# Patient Record
Sex: Female | Born: 1955 | Race: Black or African American | Hispanic: No | Marital: Single | Smoking: Never smoker
Health system: Southern US, Community
[De-identification: ages and names within clinical notes are randomized; demographics above are authoritative.]

## PROBLEM LIST (undated history)

## (undated) DIAGNOSIS — I5032 Chronic diastolic (congestive) heart failure: Secondary | ICD-10-CM

## (undated) DIAGNOSIS — R569 Unspecified convulsions: Secondary | ICD-10-CM

## (undated) DIAGNOSIS — Z86718 Personal history of other venous thrombosis and embolism: Secondary | ICD-10-CM

## (undated) DIAGNOSIS — M1990 Unspecified osteoarthritis, unspecified site: Secondary | ICD-10-CM

## (undated) DIAGNOSIS — I10 Essential (primary) hypertension: Secondary | ICD-10-CM

## (undated) DIAGNOSIS — E785 Hyperlipidemia, unspecified: Secondary | ICD-10-CM

## (undated) DIAGNOSIS — E119 Type 2 diabetes mellitus without complications: Secondary | ICD-10-CM

## (undated) DIAGNOSIS — Z8701 Personal history of pneumonia (recurrent): Secondary | ICD-10-CM

## (undated) DIAGNOSIS — N186 End stage renal disease: Secondary | ICD-10-CM

## (undated) DIAGNOSIS — G4730 Sleep apnea, unspecified: Secondary | ICD-10-CM

## (undated) HISTORY — PX: ABDOMINAL HYSTERECTOMY: SHX81

## (undated) HISTORY — PX: COLONOSCOPY: SHX174

## (undated) HISTORY — DX: Personal history of pneumonia (recurrent): Z87.01

## (undated) HISTORY — DX: End stage renal disease: N18.6

## (undated) HISTORY — DX: Hyperlipidemia, unspecified: E78.5

## (undated) HISTORY — DX: Chronic diastolic (congestive) heart failure: I50.32

---

## 1998-01-16 ENCOUNTER — Ambulatory Visit (HOSPITAL_COMMUNITY): Admission: RE | Admit: 1998-01-16 | Discharge: 1998-01-16 | Payer: Self-pay | Admitting: Obstetrics and Gynecology

## 1998-02-21 ENCOUNTER — Ambulatory Visit: Admission: RE | Admit: 1998-02-21 | Discharge: 1998-02-21 | Payer: Self-pay | Admitting: Gynecology

## 1998-02-27 ENCOUNTER — Inpatient Hospital Stay (HOSPITAL_COMMUNITY): Admission: RE | Admit: 1998-02-27 | Discharge: 1998-03-03 | Payer: Self-pay | Admitting: Obstetrics and Gynecology

## 1998-03-06 ENCOUNTER — Ambulatory Visit: Admission: RE | Admit: 1998-03-06 | Discharge: 1998-03-06 | Payer: Self-pay | Admitting: Gynecology

## 1998-03-13 ENCOUNTER — Ambulatory Visit: Admission: RE | Admit: 1998-03-13 | Discharge: 1998-03-13 | Payer: Self-pay | Admitting: Gynecologic Oncology

## 1998-03-20 ENCOUNTER — Inpatient Hospital Stay (HOSPITAL_COMMUNITY): Admission: EM | Admit: 1998-03-20 | Discharge: 1998-03-31 | Payer: Self-pay | Admitting: Gynecology

## 1998-03-29 ENCOUNTER — Encounter: Payer: Self-pay | Admitting: Obstetrics and Gynecology

## 1998-04-03 ENCOUNTER — Ambulatory Visit: Admission: RE | Admit: 1998-04-03 | Discharge: 1998-04-03 | Payer: Self-pay | Admitting: Gynecology

## 1998-06-12 ENCOUNTER — Ambulatory Visit: Admission: RE | Admit: 1998-06-12 | Discharge: 1998-06-12 | Payer: Self-pay | Admitting: Gynecology

## 1999-09-06 ENCOUNTER — Encounter: Admission: RE | Admit: 1999-09-06 | Discharge: 1999-09-06 | Payer: Self-pay | Admitting: Endocrinology

## 1999-09-06 ENCOUNTER — Encounter: Payer: Self-pay | Admitting: Endocrinology

## 2001-08-26 ENCOUNTER — Emergency Department (HOSPITAL_COMMUNITY): Admission: EM | Admit: 2001-08-26 | Discharge: 2001-08-26 | Payer: Self-pay | Admitting: Emergency Medicine

## 2001-08-26 ENCOUNTER — Encounter: Payer: Self-pay | Admitting: Emergency Medicine

## 2002-09-14 ENCOUNTER — Ambulatory Visit: Admission: RE | Admit: 2002-09-14 | Discharge: 2002-09-14 | Payer: Self-pay | Admitting: Endocrinology

## 2003-08-13 ENCOUNTER — Emergency Department (HOSPITAL_COMMUNITY): Admission: EM | Admit: 2003-08-13 | Discharge: 2003-08-13 | Payer: Self-pay

## 2003-09-05 ENCOUNTER — Inpatient Hospital Stay (HOSPITAL_COMMUNITY): Admission: EM | Admit: 2003-09-05 | Discharge: 2003-09-18 | Payer: Self-pay | Admitting: Emergency Medicine

## 2005-11-15 ENCOUNTER — Inpatient Hospital Stay (HOSPITAL_COMMUNITY): Admission: EM | Admit: 2005-11-15 | Discharge: 2005-11-19 | Payer: Self-pay | Admitting: *Deleted

## 2006-09-01 ENCOUNTER — Encounter: Admission: RE | Admit: 2006-09-01 | Discharge: 2006-11-30 | Payer: Self-pay | Admitting: Endocrinology

## 2006-11-03 ENCOUNTER — Encounter (INDEPENDENT_AMBULATORY_CARE_PROVIDER_SITE_OTHER): Payer: Self-pay | Admitting: General Surgery

## 2006-11-03 ENCOUNTER — Ambulatory Visit (HOSPITAL_BASED_OUTPATIENT_CLINIC_OR_DEPARTMENT_OTHER): Admission: RE | Admit: 2006-11-03 | Discharge: 2006-11-03 | Payer: Self-pay | Admitting: General Surgery

## 2007-07-02 ENCOUNTER — Ambulatory Visit (HOSPITAL_COMMUNITY): Admission: RE | Admit: 2007-07-02 | Discharge: 2007-07-02 | Payer: Self-pay | Admitting: Endocrinology

## 2009-05-02 ENCOUNTER — Encounter: Admission: RE | Admit: 2009-05-02 | Discharge: 2009-05-02 | Payer: Self-pay | Admitting: Endocrinology

## 2009-12-13 ENCOUNTER — Encounter: Admission: RE | Admit: 2009-12-13 | Discharge: 2009-12-13 | Payer: Self-pay | Admitting: Endocrinology

## 2010-06-23 ENCOUNTER — Encounter: Payer: Self-pay | Admitting: Internal Medicine

## 2010-10-18 NOTE — Discharge Summary (Signed)
NAME:  [name], [name]                         ACCOUNT NO.:  [id number]   MEDICAL RECORD NO.:  [place]:[id number]                   PATIENT TYPE:  INP   LOCATION:  [id number]                                 FACILITY:  [facility]   PHYSICIAN:  Cyril Mourning, D.O.                 DATE OF BIRTH:  [birth year]   DATE OF ADMISSION:  09/05/2003  DATE OF DISCHARGE:  09/18/2003                                 DISCHARGE SUMMARY   ADMISSION DIAGNOSES:  1. Shortness of breath.  2. Congestive heart failure.  3. Acute-on-chronic renal insufficiency.  4. Hypercapnic respiratory failure.   DISCHARGE DIAGNOSES:  1. Acute renal failure, presenting with a creatinine of 3.0 with historical     baseline of 1.7, status post pharmacotherapy with ACE inhibitor and     Relafen use, with resolution and return to baseline of creatinine to 1.8     at the time of discharge.  2. Hypercapnic respiratory failure without need for mechanical ventilation     this admission.  The patient escaped with use of BiPAP status post     pulmonary critical care evaluation with diagnoses of obesity,     hypoventilatory syndrome, and obstructive sleep apnea, with exacerbation     of congestive heart failure.  Of note, she was not on oxygen therapy at     home, and is being discharged on oxygen therapy at this time, as well.     She is being encouraged to comply with her CPAP at night.  3. Fluid overload secondary to acute renal failure.  Explained as above.  4. Diabetes mellitus.  5. Hyperkalemia status post therapy with Kayexalate.  6. Morbid obesity.  7. Seizure disorder greater than 20 years ago.  Tegretol was stopped this     admission without recurrence.  8. Status post hysterectomy without oophorectomy.   MEDICATIONS ON DISCHARGE:  1. Lasix 40 mg daily.  2. Aspirin 81 mg daily.  3. Albuterol metered dose inhaler two puffs q.i.d. p.r.n.  4. Lantus 26 units subcutaneous q.h.s., as she remained under relatively     good control  throughout her hospitalization, though she does take 32     units at home.  I explained to her that she may need to up-titrate this     in the outpatient setting.  5. Nu-Iron 150 mg p.o. b.i.d.  6. Claritin 10 mg p.o. q.h.s.  7. Accupril 5 mg p.o. daily.  She was on this medication at a dose of 20 mg     daily; however, due to recent events, I am starting her at a lose dose of     5 mg daily.  8. Humalog is being continued in the outpatient setting as she was on     before, 75/25, 22 units q.a.m. and q.p.m.  9. Toprol-XL 100 mg is being decreased to p.o. daily, and can be up-titrated     to  b.i.d. in the outpatient setting.  10.      Norvasc has been added at 5 mg p.o. daily.   DISPOSITION:  I have recommended that she follow up with Dr. Wilson Singer.  She has  an appointment on September 25, 2003 at 1:20 p.m.  She is instructed to have a  basic metabolic panel drawn at that time, as well.  She is instructed to  adhere to a healthy heart diet and limit her caloric intake.  She is  instructed to adhere to 24 hours a day oxygen therapy, 4 liters, as well as  a CPAP at night, setting at 5-7 mm as tolerated, and she is instructed to  check and record her CBG's q.a.m. and q.h.s., as well as if she has symptoms  of low blood sugar.   HISTORY OF PRESENTING ILLNESS:  For full details, see history and physical  as dictated by Dr. Gwynneth Aliment.  However, briefly, this is a 55 year old  African-American female who had a 3-week history of worsening dyspnea, leg  pain, and swelling.  She had a baseline history of congestive heart failure,  diabetes, and was last seen in the emergency department with worsening CHF.  She had responded to IV Lasix, and was discharged from the emergency room.  At that time, her creatinine was 1.7.  Her chest x-ray showed some  cardiomegaly with some chronic pulmonary vascular congestion and  interstitial disease.  The following day, she fell and saw her orthopedic  physician, and  was placed on Relafen, which she had taken until the day  previous to admission.  In the interim, she had been feeling worse.  She  came to the emergency department in acute renal failure with a creatinine of  3.0, and a potassium of 7.1.   HOSPITAL COURSE:  The patient was admitted to the hospital and placed on  BiPAP.  She was found to be quite acidotic and hypercapnic, with also an  initial bicarb of 22.  She underwent therapy with Kayexalate and IV diuretic  therapy and renal consultation.  She was seen by Dr. Hassell Done of the renal  service, as well as pulmonary critical care, who assisted with pulmonary  management and titrated the patient to receive intermittent BiPAP.  Her  hospital course was one of improvement.  She titrated to be off of BiPAP,  and ultimately on CPAP only at night.  She was treated for a urinary tract  infection.  Her hospital course was uneventful.  She remained on  prophylactic doses of low molecular weight heparin throughout her course.  Medications were adjusted.  Once again, she was resumed on a low dose of ACE  inhibitor, and is being discharged home in stable condition.  I have  scheduled her to follow up with Dr. Wilson Singer as noted above.   LABORATORY DATA AT THE TIME OF DISCHARGE:  BNP at the time of discharge was  91.  Her sodium was 143, potassium 4.5, BUN 18, creatinine 1.8.  There was  no echocardiogram performed this admission.                                               Cyril Mourning, D.O.    ESS/MEDQ  D:  09/18/2003  T:  09/18/2003  Job:  [id number]   cc:   [name], M.D.  [address]  Christophe Horn [address]  [place] [address]  Fax: [place] Hassell Done, M.D.  [place]  [place] [address]  Fax: [phone]   Danton Sewer, M.D. [facility]

## 2010-10-18 NOTE — Discharge Summary (Signed)
[name], [name]               ACCOUNT NO.:  [id number]   MEDICAL RECORD NO.:  [place]:[id number]          PATIENT TYPE:  INP   LOCATION:  [id number]                         FACILITY:  [facility]   PHYSICIAN:  Barbette Merino, M.D.      DATE OF BIRTH:  [birth year]   DATE OF ADMISSION:  11/15/2005  DATE OF DISCHARGE:  11/19/2005                                 DISCHARGE SUMMARY   PRIMARY CARE PHYSICIAN:  Ronaldo Miyamoto, M.D.   DISCHARGE DIAGNOSES:  1.  Uncontrolled diabetes with severe hyperglycemia.  2.  Morbid obesity.  3.  Trichomonas infection.  4.  Dyslipidemia.  5.  Chronic kidney disease.  6.  Obstructive sleep apnea.   DISCHARGE MEDICATIONS:  1.  Toprol XL 100 mg daily.  2.  Accupril 5 mg daily.  3.  Norvasc 10 mg daily.  4.  Lasix 40 mg daily.  5.  Lipitor 14 mg daily.  6.  Aspirin 81 mg daily.  7.  Actos 30 mg daily.  8.  Lantus insulin 50 units q.h.s.  9.  Humalog 70/30 insulin 45 units in the a.m. and 35 units in the p.m.  10. Flagyl 500 mg b.i.d. x3 more days.   DISPOSITION:  The patient will follow up with Dr. Wilson Singer.  Her sugars are  much better, but still slightly elevated.  She will have further adjustment  of her insulin as an outpatient.   PROCEDURES:  None.   CONSULTATIONS:  None.   BRIEF HISTORY AND PHYSICAL:  Please refer to dictated History and Physical  by the nurse practitioner on admission.  It showed, however, that the  patient is a 55 year old African American female with history of diabetes,  hypertension, morbid obesity.  1.  She was admitted directly secondary to blurred vision and hyperglycemia.      The patient has had outpatient adjustment of her insulin that did not      seem to work adequately.  Hence, she was brought in for inpatient      management.  At the time of her admission, her sugars were 609.  She was      subsequently admitted for further management.  She was hyperglycemic.      The patient was initiated on IV insulin initially, later converted  to      subcutaneous insulin.  She has had her Lantus as well as Sliding scale      insulin adjusted.  Her CBG's are declining and stayed in the 300's      initially.  She is now under 200 and further adjustment is being made      now.  She will need to follow up with her sugars at home and also follow      with Dr. Wilson Singer within the next 1-2 weeks for further adjustment.      Hemoglobin A1C was also checked, and it was 12.2, meaning that she has      been having very high sugars at home anyway.  2.  Hypertension.  Her blood pressure has been high.  Her Norvasc dose has  been switched 9-10 mg to control it to make it into the range that is      acceptable for a diabetic which lands at 130/80.  3.  Chronic kidney disease.  The patient's urine creatinine was elevated on      admission, but has been decreasing gradually.  Her creatinine was less      than 2, but she does have baseline diabetic nephropathy.  Further follow      up will be done as an outpatient.  4.  Dyslipidemia.  The patient was switched to Zocor in the hospital, and      she is currently back on her Lipitor.   Other medical problems were essentially stable as per home.  She was  dehydrated, obviously, and received adequate hydration.      Barbette Merino, M.D.  Electronically Signed     LG/MEDQ  D:  11/19/2005  T:  11/19/2005  Job:  [id number]   cc:   Ronaldo Miyamoto, M.D.  Fax: [phone]

## 2010-10-18 NOTE — Op Note (Signed)
[name], [name]               ACCOUNT NO.:  [id number]   MEDICAL RECORD NO.:  [place]:[id number]          PATIENT TYPE:  AMB   LOCATION:  [place]                          FACILITY:  [facility]   PHYSICIAN:  Orson Ape. [place], M.D.DATE OF BIRTH:  [birth year]   DATE OF PROCEDURE:  DATE OF DISCHARGE:                               OPERATIVE REPORT   PREOPERATIVE DIAGNOSES:  Lipoma, back below the left bra line, greater  than 4 cm.   POSTOPERATIVE DIAGNOSES:  Lipoma, back below the left bra line, greater  than 4 cm.   PROCEDURE:  Excision of lipoma.   SURGEON:  Orson Ape. Rise Patience, M.D.   ANESTHESIA:  Local.   HISTORY:  [name] is a 55 year old moderately overweight female,  400 pounds, who has an area about the size of an egg below her bra line  on the left back that is bothersome with the pressure pushing on the  area.  Dr. Benny Lennert, her medical physician referred her to me and  we positioned her several ways in the office and it appears that if she  is lying flat and her arms down, the area is most easily identified.  She is scheduled for excision of this today, presents and the patient  identified.   PROCEDURE IN DETAIL:  We tried her in several positions on the OR table  and it appears that this is the best and the area where the lumb is was  encompassed with a pen to mark the skin area.  I then prepped it with  Betadine solution and then about 20 cc of 1% Xylocaine with adrenalin  was used to anesthetize 1 sort of a medial area in the subcu sheath,  tissue and skin was made medial to the area to kind of like a field  block and then I anesthetized the area of skin overlying the area.  About a 3 inch incision was made, sharp dissection down through the skin  and subcutaneous tissue and then this fatty tumor was easily exposed and  a Kittner was used to kind of dissect it from the surrounding fatty  tissue.  It was kind of fingers of different appearing fatty tissue but  obviously benign.  It was completely separated. There was a little  bleeding area that come under medially and this sutured with 2 figure of  8 sutures of 4-0 Vicryl.  I then repositioned her arm to make sure there  were no other fatty areas.  I did not feel any.  Then we closed the  areas of 4-0 Vicryl and then used 3-0 Nylon simple sutures on the skin.  The patient tolerated the procedure without any significant discomfort,  minimal blood loss and light occlusive dressing was applied.  She will  keep the area dry for about 4 days.  Keep a little light covering on it  so it is not being irritated by her bra and see [place] back in the office  either in the next week or the 1st of the following week for suture  removal.  She is accompanied by her sister today  who will drive her  home.  She understands that I will not do any strenuous activity today  but she should be able to drive, etc. tomorrow.           ______________________________  Orson Ape. Rise Patience, M.D.     WJW/MEDQ  D:  11/03/2006  T:  11/03/2006  Job:  [id number]

## 2010-10-18 NOTE — Consult Note (Signed)
NAME:  [name], [name]                         ACCOUNT NO.:  [id number]   MEDICAL RECORD NO.:  [id number]                   PATIENT TYPE:  INP   LOCATION:  0157                                 FACILITY:  [facility]   PHYSICIAN:  Maudie Flakes. Hassell Done, M.D.                DATE OF BIRTH:  November 14, 1955   DATE OF CONSULTATION:  09/05/2003  DATE OF DISCHARGE:                                   CONSULTATION   REASON FOR CONSULTATION:  [name] is a 55 year old black woman admitted  with orthopnea, DOE, and increasing pretibial edema.  She has underlying  renal insufficiency with creatinine of 1.7 last month.  She hurt my leg,  got prescription for Relafen last month, and has noted the above symptoms  since then.  Dr. Gareth Eagle is her primary care doctor.   PAST MEDICAL HISTORY:  1. DM, over 20 years.  She has neuropathy, is not sure about retinopathy.  2. Hypertension, over 15 years.  3. Seizure disorder, greater than 20 years (on Tegretol).  4. CHF/? CAD.  5. Hysterectomy (ovaries still in according to her).   MEDICATIONS:  1. Glucophage 1 g b.i.d.  2. Lantus insulin 32 units q.h.s.  3. Tegretol 200 b.i.d.  4. Aspirin 81 per day.  5. Hyzaar 100/25 daily.  6. Toprol-XL 100 b.i.d.  7. Prinivil 20 per day.  8. Lipitor 80 per day.  9. Desyrel (trazodone) 50 q.h.s.  10.      Lasix 80 b.i.d.  11.      K-Dur 20 b.i.d.   ALLERGIES:  None known.   REVIEW OF SYSTEMS:  Positive for edema, DOE, orthopnea.  No angina, no  claudication, no loss of consciousness, no hematemesis, no melena, no renal  colic, no dysuria.   SOCIAL HISTORY:  She grew up in [place].  She graduated from [employer].  She worked at [employer] until several years ago.  She lives  with her sister.  She has never married.  She has no children.  She does not  smoke cigarettes or drink alcohol.  No IV drug use.  No visual problems.   FAMILY HISTORY:  Her mother died at age 7 of cancer.  Her father died of  epilepsy in his 23s.  She has four sisters, one with diabetes.  She has two  brothers who are well.  She knows [name], one of our patients who is  on dialysis.   PHYSICAL EXAMINATION:  GENERAL:  She is an awake, dyspneic, overweight black  woman.  VITAL SIGNS:  Blood pressure 110/70, temperature 98, pulse 80, respirations  18.  HEENT:  Mouth:  Teeth are carious.  No oral ulcers are noted.  CHEST:  Decreased breath sounds in bases.  HEART:  No rub is heard.  ABDOMEN:  Abdominal wall edema is present.  Abdomen is protuberant.  Cannot  feel any organs or masses.  Bowel sounds are present.  GENITOURINARY AND RECTAL:  Not done.  EXTREMITIES:  Show 3+ pretibial edema.  NEUROLOGIC:  Right-handed.  Decreased sensation in feet.  Strength equal.   LABORATORY DATA:  Hemoglobin 12.3; WBC 6200; PLT'S 337,000.  Sodium 145,  potassium 7.1, chloride 113, CO2 22, BUN 86, CR 3.0, calcium 8.6, albumin  3.1.  PH 7.2, PO2 84, PCO2 66 on 2 L of oxygen.  Urinalysis not available.  Chest x-ray:  Cannot find results.  EKG:  No peaked T waves; low voltage.   IMPRESSION:  1. Diabetes mellitus.  2. Hypertension.  3. Elevated serum creatinine over baseline of 1.7 with elevated BUN,     creatinine and potassium.  4. Carbon dioxide retention/obstructive sleep apnea/hypoventilation.  5. Seizure disorder, on Tegretol.  6. Fluid overload (looks wet to me).   PLAN:  1. Discontinue Glucophage, continue Lantus with SSI.  2. Avoid ACE inhibitors, A II blockers for now.  May use once renal function     improves.  3. A 24-hour urine for protein, creatinine, UPEP, UA; renal ultrasound if     possible.  Save left arm for vascular access.  Calcium, glucose, insulin,     bicarb, Kayexalate, check potassium later tonight.  4. BiPAP, avoid elevated FIO2.  5. Continue Tegretol.  6. Lasix.   Will follow.                                               Richard F. Hassell Done, M.D.    RFF/MEDQ  D:  09/05/2003  T:   09/06/2003  Job:  [id number]

## 2010-10-18 NOTE — H&P (Signed)
NAME:  [name], CASTLEBERRY                         ACCOUNT NO.:  [id number]   MEDICAL RECORD NO.:  [id number]                   PATIENT TYPE:  INP   LOCATION:  0157                                 FACILITY:  [facility]   PHYSICIAN:  Jonna L. Gwynneth Aliment, M.D.            DATE OF BIRTH:  1956-05-21   DATE OF ADMISSION:  09/05/2003  DATE OF DISCHARGE:                                HISTORY & PHYSICAL   CHIEF COMPLAINT:  Shortness of breath.   HISTORY:  This 55 year old African-American female has a 3 week history of  worsening dyspnea, leg pain, some swelling.  She has a baseline history of  congestive heart failure, diabetes, and was last seen here in the emergency  room on August 13, 2003, with worsening of her congestive heart failure.  It  was found at that time that she had missed several Lasix doses, so she was  given Lasix 80 IV in the emergency room and discharged from there.  At that  time, her creatinine was 1.7, and her chest x-ray showed cardiomegaly and  some chronic pulmonary vascular congestion and interstitial disease.  The  next day, she fell and am not clear if she saw the orthopedist or if this  was called in for her, but she was placed on Relafen which she took until  yesterday.  In that interim, she has just been feeling worse.  She comes  into the emergency room now in acute renal failure and hyperkalemia.   PAST MEDICAL HISTORY:  1. Congestive heart failure, hypertension, coronary artery disease.  2. Type 2 diabetes.  3. Diabetic peripheral neuropathy.  4. Hypercholesterolemia.  5. Osteoarthritis.  6. History of seizures.   ALLERGIES:  None.   OPERATIONS:  Hysterectomy for fibroids.   SOCIAL HISTORY:  Nonsmoker, nondrinker.   FAMILY HISTORY:  Hypertension, diabetes, CVA, heart failure.   MEDICATIONS:  1. Lasix 80 mg 1/2-1 b.i.d.  2. Potassium 20 b.i.d.  3. Desyrel 50 h.s.  4. Lipitor 80 daily.  5. Glucophage 1000 b.i.d.  6. Prinivil 20 daily.  7. Toprol 100  b.i.d.  8. Hyzaar 100/25 daily.  9. Aspirin 81 daily.  10.      Tegretol 200 b.i.d.  11.      Humalog 75/25, 22 units in the a.m.  12.      Lantus 32 units at h.s.   REVIEW OF SYSTEMS:  The patient in general has had weakness, loss of  appetite, some chronic visual problems, no sore throat.  She has had  insomnia for the last 3 weeks.  Her neuropathy gives her numbness in her  legs.  She has no history of DVT or PE.  Some low-grade nausea, no vomiting,  diarrhea, or constipation.  No hematuria or kidney stones. No breast masses.  No history of thyroid problems.  No swollen glands.  Chronic changes in her  skin in her legs.  She has diffuse osteoarthritis,  especially in her hips  and knees.  She has a history of seizures but no CVA.  Has had a lot of  anxiety and difficulty sleeping, subsequently been started on the Desyrel.   PHYSICAL EXAMINATION ON ADMISSION:  VITAL SIGNS:  Height 5 feet 6 inches.  Weight 387 pounds, temperature 97.8, pulse 85, respirations 18, blood  pressure 122/71, 85% O2 saturation on room air.  GENERAL:  The patient is a morbidly obese African-American female sitting up  in bed.  HEENT:  Conjunctivae and lids are normal.  Pupils are reactive to light.  EOMs are full.  She has normal hearing, mucosa, and pharynx.  NECK:  Some thyromegaly.  It is supple.  There is no tenderness.  BREASTS:  No masses or tenderness.  LUNGS:  Normal respiratory effort, but it increases with exertion.  The  lungs are clear to the bases.  I do not hear any dullness.  HEART:  Regular rate and rhythm.  Normal S1 and S2 without murmurs, rubs, or  gallops.  EXTREMITIES:  There is no cyanosis or clubbing.  She has stasis dermatitis  changes, 2+ edema bilaterally, and it almost like a thickened, brawny edema.  ABDOMEN:  Nontender, normal bowel sounds.  No hepatosplenomegaly or hernias.  ORTHOPEDIC:  Muscle strength is 5/5 in all four extremities.  There seems to  be full range of motion.   There are no skin nodules or induration.  NEUROLOGIC:  Cranial nerves are intact.  DTRs are 1+ in the knees.  She has  decreased sensation, numbness and burning in both feet, is alert and  oriented x 3.  Normal memory, judgment, affect.   LABORATORY DATA:  CBC is normal.  BNP is 706.  Enzymes were negative.  PA  7.192, pCO2 65.6, pO2 84.4.  Sodium 145, potassium 7.1, chloride 113, bicarb  22, BUN 86, creatinine 3.0, glucose 88.  The rest of the BMP was normal.  EKG shows low voltage and surprisingly, no peaked T-waves.   IMPRESSION:  1. Acute on chronic renal failure.  __________ baseline creatinine of 1.7.     She has had to be on diuretics, ACE, ARB, and I think adding the anti-     inflammatory on top of it probably precipitated some acute renal failure.     The other possibility is that the patient is dry, but she still has some     edema, and there were no other changes in her medications.  I am going to     hold anything that gives you hyperkalemia or interferes with renal     function including her Metformin, the ACE, ARB, potassium supplement and     the anti-inflammatories.  Her hyperkalemia will be treated with bicarb,     calcium, glucose, insulin, and Kayexalate.  I have spoken with [facility] for consultation.  2. Congestive heart failure, hypertension, coronary artery disease.  I will     just keep her on Lasix only.  3. Type 2 diabetes.  I will keep her on just insulin sliding-scale and a     smaller dose of her Lantus.  4. Osteoarthritis.  She can have some Tylenol.  5. History of seizures.  I will check her Tegretol level first before giving     her any further.  6. Hypercholesterolemia.  I am going to hold on her Lipitor at present.  7. Probable obstructive sleep apnea with morbid obesity and chronic  respiratory failure.  She will be placed on some BiPAP or CPAP to try to     improve this.                                              Jonna L.  Gwynneth Aliment, M.D.    Kirby Crigler  D:  09/05/2003  T:  09/06/2003  Job:  [id number]   cc:   Ronaldo Miyamoto, M.D.  [place]  [place] [address]  Fax: [phone]

## 2010-10-18 NOTE — Consult Note (Signed)
NAME:  [name], [name]                         ACCOUNT NO.:  [id number]   MEDICAL RECORD NO.:  [id number]                   PATIENT TYPE:  EMS   LOCATION:  ED                                   FACILITY:  [facility]   PHYSICIAN:  Luis A. Jonelle Sidle, M.D.                DATE OF BIRTH:  09/25/1955   DATE OF CONSULTATION:  08/13/2003  DATE OF DISCHARGE:                                   CONSULTATION   REASON FOR CONSULTATION:  Shortness of breath.   HISTORY:  This is a very pleasant morbidly obese 55 year old African-  American female with a history of hypertension, insulin-dependent diabetes,  hypercholesterolemia, who presents to the emergency department with  shortness of breath.  The patient admits that she missed several doses of  her oral furosemide.  She denies any chest pain, any nausea, vomiting,  diarrhea, or any other symptoms.  While in the emergency department, there  was some observed pulse oximetries in the mid to low 80s which improved  considerably with intravenous Lasix.  At the time of this evaluation, the  patient has a consistent in the 92% and above range on room air.   PHYSICAL EXAMINATION:  VITAL SIGNS:  Blood pressure 133/74, heart rate 79,  temperature 98.1.  GENERAL:  The patient is morbidly obese, but in no acute distress.  HEART:  Heart sounds are very distant.  LUNGS:  Appear clear, though physical examination is limited by her body  habitus.  ABDOMEN:  Soft and very protuberant.  EXTREMITIES:  No edema.  NEUROLOGIC:  Nonfocal without sensory or motor deficits.   LABORATORY DATA:  EKG reveals left axis deviation without any EKG changes to  suggest acute ischemia or prior myocardial infarction.  Brain natruretic  peptide is 698, which is consistent with the patient's known heart failure.  Cardiac enzymes were negative.  CBC is entirely within normal limits.  BMP  reveals a sodium of 145, potassium 5, chloride 110, carbon dioxide 29,  glucose 109, creatinine 1.7,  calcium 8.6.  Chest x-ray reveals some vascular  congestion, otherwise unremarkable.   IMPRESSION:  Mild volume overload in a morbidly obese patient with known  heart failure secondary to non-compliance with furosemide.  The patient's  respiratory status significantly improved following intravenous  administration of Lasix.  She is deemed to be in good condition to be  discharged from the emergency department.  She is advised to follow up with  Dr. Wilson Singer, her primary care physician, on August 14, 2003, to address ongoing  issues of diabetes management, hypercholesterolemia, and of course morbid  obesity.                                               Luis A. Jonelle Sidle, M.D.   LAT/MEDQ  D:  08/13/2003  T:  08/14/2003  Job:  [id number]   cc:   Ronaldo Miyamoto, M.D.  [address]  [place] [address]  Fax: [phone]

## 2010-10-18 NOTE — H&P (Signed)
[name], [name]               ACCOUNT NO.:  [id number]   MEDICAL RECORD NO.:  [id number]          PATIENT TYPE:  INP   LOCATION:  0102                         FACILITY:  [facility]   PHYSICIAN:  Juanito Doom, MD       DATE OF BIRTH:  [birth year]   DATE OF ADMISSION:  11/15/2005  DATE OF DISCHARGE:                                HISTORY & PHYSICAL   FOLLOW UP:  The patient is to follow up with Dr. Wilson Singer.   PRESENTING COMPLAINT:  High blood glucose and blurred vision.   HISTORY OF THE PRESENT ILLNESS:  This a 55 year old African-American female  with a history of hypertension, diabetes and morbid obesity who comes with  complaints of blurred vision and a very high glucose level.  She states she  has been feeling sick or the past five days.  On Friday she went to Dr.  Eugenio Hoes office and her Lantus was increased from 30 units at bedtime to 40  units.  She states she __________ .  She has been drinking a lot of water  and has also been to the bathroom several times, and for these reasons the  patient came to the emergency department today.  She denies any headache or  dizziness.  No nausea, no vomiting or diarrhea.  She denies any chest pain  or shortness of breath.  She has no cough or wheezing.  The patient denies  any abdominal pain.  She denies any dysuria, fever, weight loss or swelling  of the feet.   PAST MEDICAL HISTORY:  The past medical history is significant for:  1.  Hypertension.  2.  Diabetes.  3.  Hypercholesterolemia.  4.  Morbid obesity.   PAST SURGICAL HISTORY:  The past surgical history includes a hysterectomy.   ALLERGIES:  The patient denies any known drug allergies.   FAMILY HISTORY:  The family history is significant for diabetes in her  father.   SOCIAL HISTORY:  The patient's sister lives with her.  She denies cigarettes  smoking.  She denies any alcohol use.  She has no history of drug abuse.   MEDICATIONS:  Home medications include;  1.  Actos 20 mg   daily.  2.  Lantus 40 units at bedtime.  3.  Humalog 75/25, 30 units in the morning and 24 units in the evening..  4.  Toprol XL 100 mg daily.  5.  Quinapril 5 mg daily.  6.  Norvasc 5 mg daily.  7.  Lasix 40 mg daily.  8.  Lipitor 40 mg daily  9.  Aspirin 81 mg daily.   PHYSICAL EXAMINATION:  VITAL SIGNS:  Blood pressure 166/67, which is  elevated, heart rate 76 and respirations were 24 and presently is 22.  The  oxygen saturation is 97% on room air.  HEAD AND NECK:  Examination shows pink conjunctiva.  The patient has no  jaundice.  The neck is supple. Mucous membranes are slightly dry.  Tympanic  membranes area clear.  CHEST:  The chest moves well with respirations, which are clear to  auscultation.  Normal breath  sounds.  HEART:  Cardiovascular exam shows first and second heart sounds with no  murmurs or gallops appreciated.  Pulses are palpable in the peripheral  areas.  ABDOMEN:  The abdomen is soft with no masses palpable.  No abdominal  tenderness.  The patient has normal bowel sounds.  EXTREMITIES:  The extremities show no edema  or cyanosis.  The patient has  no joint tenderness or joint swelling.  NEUROLOGIC EXAMINATION:  Central nervous system - the patient is alert and  oriented to time, place and person.  There are no focal deficits.  __________  full in both the upper and lower extremities.  Sensation is  grossly intact.  Tongue is midline.  There is no facial asymmetry.  Her  language is also clear.   LABORATORY DATA:  Initial blood work shows WBC 7.3, hemoglobin 12.4  hematocrit 37.3 and platelets 303,000.  Sodium 135, potassium 4.7, chloride  101, bicarb 27, BUN 32, creatinine 2.0, and glucose 609.  Calcium 8.9.  __________  7.3.  Urinalysis; glucose 1,000, urine ketones negative, nitrite  negative, WBC 7-10, which is slightly elevated, and also there is the  presence of trichomonas.   ASSESSMENT:  1.  Hyperglycemia secondary to uncontrolled diabetes.   The  plan is to begin the patient on an insulin drip.   1.  History of hypertension.   I will continue the patient's blood pressure medications of Toprol and  quinapril.   1.  History of hypercholesterolemia.   I will continue her Lipitor.   1.  Renal insufficiency.   The patient will be hydrated with normal saline at 100 mL/hour and we repeat  the Chem 7 in the A.M.   1.  Morbid obesity.   The patient may need surgical intervention to help reduce her weight.   1.  Trichomonas infection.   The patient will be treated with Flagyl 500 mg 2 tablets a day for one week.   1.  Possible urinary tract infection secondary to increased white blood      cells in the urine as well as  positive leukocyte esterase.   The patient will be given Rocephin 1 gram intravenously daily for three  days.   I will also consult endocrine to evaluate the patient.      Juanito Doom, MD     GA/MEDQ  D:  11/15/2005  T:  11/16/2005  Job:  [id number]   cc:   Ronaldo Miyamoto, M.D.  Fax: [phone]

## 2011-11-11 ENCOUNTER — Other Ambulatory Visit (HOSPITAL_COMMUNITY): Payer: Self-pay | Admitting: Endocrinology

## 2011-11-11 DIAGNOSIS — Z1231 Encounter for screening mammogram for malignant neoplasm of breast: Secondary | ICD-10-CM

## 2011-12-03 ENCOUNTER — Ambulatory Visit (HOSPITAL_COMMUNITY)
Admission: RE | Admit: 2011-12-03 | Discharge: 2011-12-03 | Disposition: A | Discharge status: Home / Self Care | Payer: Medicare Other | Source: Ambulatory Visit | Attending: Endocrinology | Admitting: Endocrinology

## 2011-12-03 DIAGNOSIS — Z1231 Encounter for screening mammogram for malignant neoplasm of breast: Secondary | ICD-10-CM

## 2013-10-13 ENCOUNTER — Emergency Department (HOSPITAL_COMMUNITY): Payer: Medicare Other

## 2013-10-13 ENCOUNTER — Encounter (HOSPITAL_COMMUNITY): Payer: Self-pay | Admitting: Emergency Medicine

## 2013-10-13 ENCOUNTER — Emergency Department (HOSPITAL_COMMUNITY)
Admission: EM | Admit: 2013-10-13 | Discharge: 2013-10-13 | Disposition: A | Discharge status: Home / Self Care | Payer: Medicare Other | Attending: Emergency Medicine | Admitting: Emergency Medicine

## 2013-10-13 DIAGNOSIS — J189 Pneumonia, unspecified organism: Secondary | ICD-10-CM

## 2013-10-13 DIAGNOSIS — I10 Essential (primary) hypertension: Secondary | ICD-10-CM | POA: Insufficient documentation

## 2013-10-13 DIAGNOSIS — E7800 Pure hypercholesterolemia, unspecified: Secondary | ICD-10-CM | POA: Insufficient documentation

## 2013-10-13 DIAGNOSIS — Z79899 Other long term (current) drug therapy: Secondary | ICD-10-CM | POA: Insufficient documentation

## 2013-10-13 DIAGNOSIS — Z87442 Personal history of urinary calculi: Secondary | ICD-10-CM | POA: Insufficient documentation

## 2013-10-13 DIAGNOSIS — E119 Type 2 diabetes mellitus without complications: Secondary | ICD-10-CM | POA: Insufficient documentation

## 2013-10-13 DIAGNOSIS — J159 Unspecified bacterial pneumonia: Secondary | ICD-10-CM | POA: Insufficient documentation

## 2013-10-13 HISTORY — DX: Type 2 diabetes mellitus without complications: E11.9

## 2013-10-13 HISTORY — DX: Essential (primary) hypertension: I10

## 2013-10-13 MED ORDER — ALBUTEROL SULFATE (2.5 MG/3ML) 0.083% IN NEBU
5.0000 mg | INHALATION_SOLUTION | Freq: Once | RESPIRATORY_TRACT | Status: AC
  Administered 2013-10-13: 5 mg via RESPIRATORY_TRACT
  Filled 2013-10-13: qty 6

## 2013-10-13 MED ORDER — LEVOFLOXACIN 750 MG PO TABS: 750.0000 mg | ORAL_TABLET | Freq: Every day | ORAL | Status: DC

## 2013-10-13 MED ORDER — HYDROCOD POLST-CHLORPHEN POLST 10-8 MG/5ML PO LQCR: 5.0000 mL | Freq: Two times a day (BID) | ORAL | Status: DC | PRN

## 2013-10-13 MED ORDER — PREDNISONE 20 MG PO TABS: 40.0000 mg | ORAL_TABLET | Freq: Every day | ORAL | Status: DC

## 2013-10-13 MED ORDER — IPRATROPIUM BROMIDE 0.02 % IN SOLN
0.5000 mg | Freq: Once | RESPIRATORY_TRACT | Status: AC
  Administered 2013-10-13: 0.5 mg via RESPIRATORY_TRACT
  Filled 2013-10-13: qty 2.5

## 2013-10-13 MED ORDER — ALBUTEROL SULFATE HFA 108 (90 BASE) MCG/ACT IN AERS: 2.0000 | INHALATION_SPRAY | RESPIRATORY_TRACT | Status: DC | PRN

## 2013-10-13 NOTE — ED Notes (Signed)
Ambulated patient with pulse ox, patients 02 levels were at 95 during ambulation.

## 2013-10-13 NOTE — ED Provider Notes (Signed)
CSN: [id number]     Arrival date & time 10/13/13  1050 History   First MD Initiated Contact with Patient 10/13/13 1127     Chief Complaint  Patient presents with  . Shortness of Breath  . Cough     (Consider location/radiation/quality/duration/timing/severity/associated sxs/prior Treatment) HPI Comments: Patient is a 58 year old female with history of hypertension, diabetes who presents to the emergency department today with 4 days of gradually worsening cough. She states the cough is productive. Today she began to feel mildly short of breath. No chest pain or pain with inspiration. She denies any fevers, chills, nausea, vomiting. She has not been on any antibiotics recently. She denies any lung disease. She has never smoked cigarettes in the past.  The history is provided by the patient. No language interpreter was used.    Past Medical History  Diagnosis Date  . Diabetes mellitus without complication   . Hypertension   . Kidney disease   . High cholesterol    Past Surgical History  Procedure Laterality Date  . Abdominal hysterectomy     No family history on file. History  Substance Use Topics  . Smoking status: Never Smoker   . Smokeless tobacco: Not on file  . Alcohol Use: No   OB History   Grav Para Term Preterm Abortions TAB SAB Ect Mult Living                 Review of Systems  Constitutional: Negative for fever and chills.  Respiratory: Positive for cough and shortness of breath. Negative for chest tightness.   Cardiovascular: Negative for chest pain.  Gastrointestinal: Negative for nausea, vomiting and abdominal pain.  All other systems reviewed and are negative.     Allergies  Review of patient's allergies indicates no known allergies.  Home Medications   Prior to Admission medications   Medication Sig Start Date End Date Taking? Authorizing Provider  Alogliptin-Pioglitazone 12.5-30 MG TABS Take 1 tablet by mouth daily.   Yes Historical Provider, MD   amLODipine (NORVASC) 10 MG tablet Take 10 mg by mouth daily.   Yes Historical Provider, MD  atorvastatin (LIPITOR) 40 MG tablet Take 40 mg by mouth daily.   Yes Historical Provider, MD  cloNIDine (CATAPRES) 0.3 MG tablet Take 0.3 mg by mouth 2 (two) times daily.   Yes Historical Provider, MD  febuxostat (ULORIC) 40 MG tablet Take 40 mg by mouth daily.   Yes Historical Provider, MD  furosemide (LASIX) 80 MG tablet Take 80 mg by mouth daily.   Yes Historical Provider, MD  Liraglutide (VICTOZA) 18 MG/3ML SOPN Inject into the skin.   Yes Historical Provider, MD  metoprolol (LOPRESSOR) 100 MG tablet Take 100 mg by mouth 2 (two) times daily.   Yes Historical Provider, MD  metoprolol succinate (TOPROL-XL) 100 MG 24 hr tablet Take 100 mg by mouth 2 (two) times daily. Take with or immediately following a meal.   Yes Historical Provider, MD   BP 160/84  Pulse 74  Temp(Src) 98.4 F (36.9 C) (Oral)  Resp 21  SpO2 96% Physical Exam  Nursing note and vitals reviewed. Constitutional: She is oriented to person, place, and time. She appears well-developed and well-nourished. No distress.  Morbidly obese  HENT:  Head: Normocephalic and atraumatic.  Right Ear: External ear normal.  Left Ear: External ear normal.  Nose: Nose normal.  Mouth/Throat: Oropharynx is clear and moist.  Eyes: Conjunctivae are normal.  Neck: Normal range of motion.  Cardiovascular: Normal rate, regular  rhythm and normal heart sounds.   Pulmonary/Chest: Effort normal. No stridor. No respiratory distress. She has no wheezes. She has rales.  Speaking in full sentences  Abdominal: Soft. She exhibits no distension.  Musculoskeletal: Normal range of motion.  Neurological: She is alert and oriented to person, place, and time. She has normal strength.  Skin: Skin is warm and dry. She is not diaphoretic. No erythema.  Psychiatric: She has a normal mood and affect. Her behavior is normal.    ED Course  Procedures (including  critical care time) Labs Review Labs Reviewed - No data to display  Imaging Review Dg Chest 2 View  10/13/2013   CLINICAL DATA:  Cough dyspnea congestion 4 days history of hypertension, nonsmoker  EXAM: CHEST  2 VIEW  COMPARISON:  DG CHEST 2V dated 01/09/2012  FINDINGS: The heart size and vascular pattern are normal. The left lung is clear. There are no pleural effusions. On the right, there is dense consolidation of the middle lobe.  IMPRESSION: Dense right middle lobe consolidation consistent with pneumonia. Recommend radiographic follow-up after appropriate therapy to ensure resolution.   Electronically Signed   By: Skipper Cliche M.D.   On: 10/13/2013 11:49     Date: 10/15/2013  Rate: 68  Rhythm: normal sinus rhythm  QRS Axis: normal  Intervals: normal  ST/T Wave abnormalities: normal  Conduction Disutrbances:none  Narrative Interpretation:   Old EKG Reviewed: none available    MDM   Final diagnoses:  CAP (community acquired pneumonia)    Patient has been diagnosed with CAP via chest xray. Pt is not ill appearing, immunocompromised, and does not have multiple co morbidities, therefore I feel like the they can be treated as an OP with abx therapy. Pt has been advised to return to the ED if symptoms worsen or they do not improve. Pt verbalizes understanding and is agreeable with plan.  Discussed case with Dr. Wilson Singer who agrees with plan.     Elwyn Lade, PA-C 10/15/13 [phone]

## 2013-10-13 NOTE — Discharge Instructions (Signed)
Pneumonia, Adult °Pneumonia is an infection of the lungs. It may be caused by a germ (virus or bacteria). Some types of pneumonia can spread easily from person to person. This can happen when you cough or sneeze. °HOME CARE °· Only take medicine as told by your doctor. °· Take your medicine (antibiotics) as told. Finish it even if you start to feel better. °· Do not smoke. °· You may use a vaporizer or humidifier in your room. This can help loosen thick spit (mucus). °· Sleep so you are almost sitting up (semi-upright). This helps reduce coughing. °· Rest. °A shot (vaccine) can help prevent pneumonia. Shots are often advised for: °· People over 65 years old. °· Patients on chemotherapy. °· People with long-term (chronic) lung problems. °· People with immune system problems. °GET HELP RIGHT AWAY IF:  °· You are getting worse. °· You cannot control your cough, and you are losing sleep. °· You cough up blood. °· Your pain gets worse, even with medicine. °· You have a fever. °· Any of your problems are getting worse, not better. °· You have shortness of breath or chest pain. °MAKE SURE YOU:  °· Understand these instructions. °· Will watch your condition. °· Will get help right away if you are not doing well or get worse. °Document Released: 11/05/2007 Document Revised: 08/11/2011 Document Reviewed: 08/09/2010 °Exit[employer]® Patient Information ©2014 [employer]. ° °

## 2013-10-13 NOTE — ED Notes (Signed)
PT c/o SOB x 1 day and cough x 4 days. Denies chest pain, headache, or dizziness.

## 2013-10-13 NOTE — Progress Notes (Signed)
  CARE MANAGEMENT ED NOTE 10/13/2013  Patient:  LATAVIA, BURELL   Account Number:  [id number]  Date Initiated:  10/13/2013  Documentation initiated by:  Jackelyn Poling  Subjective/Objective Assessment:   58 yr old [employer] medicare complete confirms pcp as Anda Kraft     Subjective/Objective Assessment Detail:     Action/Plan:   EPIC updated   Action/Plan Detail:   Anticipated DC Date:  10/13/2013     Status Recommendation to Physician:   Result of Recommendation:    Other ED [place]  Other  PCP issues  Outpatient Services - Pt will follow up    Choice offered to / List presented to:            Status of service:  Completed, signed off  ED Comments:   ED Comments Detail:

## 2013-10-17 NOTE — ED Provider Notes (Signed)
Medical screening examination/treatment/procedure(s) were performed by non-physician practitioner and as supervising physician I was immediately available for consultation/collaboration.   EKG Interpretation   Date/Time:  Thursday Oct 13 2013 11:12:47 EDT Ventricular Rate:  68 PR Interval:  165 QRS Duration: 104 QT Interval:  410 QTC Calculation: 436 R Axis:   41 Text Interpretation:  Sinus rhythm Baseline wander in lead(s) V3 V4 ED  PHYSICIAN INTERPRETATION AVAILABLE IN [facility] Confirmed by TEST,  Record ([id number]) on 10/15/2013 10:01:08 AM       Virgel Manifold, MD 10/17/13 [phone]

## 2014-06-16 DIAGNOSIS — N184 Chronic kidney disease, stage 4 (severe): Secondary | ICD-10-CM | POA: Diagnosis not present

## 2014-06-16 DIAGNOSIS — I10 Essential (primary) hypertension: Secondary | ICD-10-CM | POA: Diagnosis not present

## 2014-07-12 DIAGNOSIS — E118 Type 2 diabetes mellitus with unspecified complications: Secondary | ICD-10-CM | POA: Diagnosis not present

## 2014-07-12 DIAGNOSIS — K5901 Slow transit constipation: Secondary | ICD-10-CM | POA: Diagnosis not present

## 2014-07-24 DIAGNOSIS — N19 Unspecified kidney failure: Secondary | ICD-10-CM | POA: Diagnosis not present

## 2014-07-24 DIAGNOSIS — R092 Respiratory arrest: Secondary | ICD-10-CM | POA: Diagnosis not present

## 2014-07-24 DIAGNOSIS — G4733 Obstructive sleep apnea (adult) (pediatric): Secondary | ICD-10-CM | POA: Diagnosis not present

## 2014-07-24 DIAGNOSIS — I509 Heart failure, unspecified: Secondary | ICD-10-CM | POA: Diagnosis not present

## 2014-07-24 DIAGNOSIS — M1712 Unilateral primary osteoarthritis, left knee: Secondary | ICD-10-CM | POA: Diagnosis not present

## 2014-10-04 DIAGNOSIS — E118 Type 2 diabetes mellitus with unspecified complications: Secondary | ICD-10-CM | POA: Diagnosis not present

## 2014-10-11 DIAGNOSIS — E118 Type 2 diabetes mellitus with unspecified complications: Secondary | ICD-10-CM | POA: Diagnosis not present

## 2014-10-11 DIAGNOSIS — I10 Essential (primary) hypertension: Secondary | ICD-10-CM | POA: Diagnosis not present

## 2014-10-17 DIAGNOSIS — N184 Chronic kidney disease, stage 4 (severe): Secondary | ICD-10-CM | POA: Diagnosis not present

## 2014-10-17 DIAGNOSIS — I10 Essential (primary) hypertension: Secondary | ICD-10-CM | POA: Diagnosis not present

## 2015-02-15 DIAGNOSIS — E118 Type 2 diabetes mellitus with unspecified complications: Secondary | ICD-10-CM | POA: Diagnosis not present

## 2015-02-15 DIAGNOSIS — I10 Essential (primary) hypertension: Secondary | ICD-10-CM | POA: Diagnosis not present

## 2015-02-15 DIAGNOSIS — Z0000 Encounter for general adult medical examination without abnormal findings: Secondary | ICD-10-CM | POA: Diagnosis not present

## 2015-02-15 DIAGNOSIS — E789 Disorder of lipoprotein metabolism, unspecified: Secondary | ICD-10-CM | POA: Diagnosis not present

## 2015-02-20 DIAGNOSIS — E118 Type 2 diabetes mellitus with unspecified complications: Secondary | ICD-10-CM | POA: Diagnosis not present

## 2015-02-20 DIAGNOSIS — I10 Essential (primary) hypertension: Secondary | ICD-10-CM | POA: Diagnosis not present

## 2015-02-20 DIAGNOSIS — R609 Edema, unspecified: Secondary | ICD-10-CM | POA: Diagnosis not present

## 2015-02-20 DIAGNOSIS — E789 Disorder of lipoprotein metabolism, unspecified: Secondary | ICD-10-CM | POA: Diagnosis not present

## 2015-02-28 DIAGNOSIS — N184 Chronic kidney disease, stage 4 (severe): Secondary | ICD-10-CM | POA: Diagnosis not present

## 2015-02-28 DIAGNOSIS — I10 Essential (primary) hypertension: Secondary | ICD-10-CM | POA: Diagnosis not present

## 2015-03-02 ENCOUNTER — Other Ambulatory Visit: Payer: Self-pay | Admitting: *Deleted

## 2015-03-02 DIAGNOSIS — N184 Chronic kidney disease, stage 4 (severe): Secondary | ICD-10-CM

## 2015-03-02 DIAGNOSIS — Z01810 Encounter for preprocedural cardiovascular examination: Secondary | ICD-10-CM

## 2015-03-07 DIAGNOSIS — L0390 Cellulitis, unspecified: Secondary | ICD-10-CM | POA: Diagnosis not present

## 2015-03-07 DIAGNOSIS — Z23 Encounter for immunization: Secondary | ICD-10-CM | POA: Diagnosis not present

## 2015-03-13 DIAGNOSIS — L0390 Cellulitis, unspecified: Secondary | ICD-10-CM | POA: Diagnosis not present

## 2015-03-13 DIAGNOSIS — I10 Essential (primary) hypertension: Secondary | ICD-10-CM | POA: Diagnosis not present

## 2015-03-13 DIAGNOSIS — E790 Hyperuricemia without signs of inflammatory arthritis and tophaceous disease: Secondary | ICD-10-CM | POA: Diagnosis not present

## 2015-03-13 DIAGNOSIS — R42 Dizziness and giddiness: Secondary | ICD-10-CM | POA: Diagnosis not present

## 2015-04-18 ENCOUNTER — Other Ambulatory Visit (HOSPITAL_COMMUNITY): Payer: Self-pay

## 2015-04-18 ENCOUNTER — Ambulatory Visit: Payer: Self-pay | Admitting: Vascular Surgery

## 2015-04-18 ENCOUNTER — Encounter (HOSPITAL_COMMUNITY): Payer: Self-pay

## 2015-05-01 ENCOUNTER — Encounter: Payer: Self-pay | Admitting: Vascular Surgery

## 2015-05-04 ENCOUNTER — Encounter: Payer: Self-pay | Admitting: Vascular Surgery

## 2015-05-04 ENCOUNTER — Ambulatory Visit (HOSPITAL_COMMUNITY)
Admission: RE | Admit: 2015-05-04 | Discharge: 2015-05-04 | Disposition: A | Discharge status: Home / Self Care | Payer: Medicare Other | Source: Ambulatory Visit | Attending: Vascular Surgery | Admitting: Vascular Surgery

## 2015-05-04 ENCOUNTER — Ambulatory Visit (INDEPENDENT_AMBULATORY_CARE_PROVIDER_SITE_OTHER)
Admission: RE | Admit: 2015-05-04 | Discharge: 2015-05-04 | Disposition: A | Discharge status: Home / Self Care | Payer: Medicare Other | Source: Ambulatory Visit | Attending: Vascular Surgery | Admitting: Vascular Surgery

## 2015-05-04 ENCOUNTER — Ambulatory Visit (INDEPENDENT_AMBULATORY_CARE_PROVIDER_SITE_OTHER): Payer: Medicare Other | Admitting: Vascular Surgery

## 2015-05-04 VITALS — BP 184/85 | HR 61 | Ht 66.0 in | Wt 385.0 lb

## 2015-05-04 DIAGNOSIS — I82711 Chronic embolism and thrombosis of superficial veins of right upper extremity: Secondary | ICD-10-CM | POA: Insufficient documentation

## 2015-05-04 DIAGNOSIS — Z01810 Encounter for preprocedural cardiovascular examination: Secondary | ICD-10-CM | POA: Insufficient documentation

## 2015-05-04 DIAGNOSIS — N184 Chronic kidney disease, stage 4 (severe): Secondary | ICD-10-CM | POA: Insufficient documentation

## 2015-05-04 DIAGNOSIS — I120 Hypertensive chronic kidney disease with stage 5 chronic kidney disease or end stage renal disease: Secondary | ICD-10-CM | POA: Insufficient documentation

## 2015-05-04 DIAGNOSIS — E119 Type 2 diabetes mellitus without complications: Secondary | ICD-10-CM | POA: Insufficient documentation

## 2015-05-04 DIAGNOSIS — E7800 Pure hypercholesterolemia, unspecified: Secondary | ICD-10-CM | POA: Insufficient documentation

## 2015-05-04 NOTE — Progress Notes (Signed)
Referred by:  Anda Kraft, MD [address] [place], [place] [address]  Reason for referral: New access  History of Present Illness  [name] is a 59 y.o. (1955-11-17) female who presents for evaluation for permanent access.  The patient is right hand dominant.  The patient has not had previous access procedures.  Previous central venous cannulation procedures include: none.  The patient has never had a PPM placed.   Past Medical History  Diagnosis Date  . Diabetes mellitus without complication ([place])   . Hypertension   . Kidney disease   . High cholesterol     Past Surgical History  Procedure Laterality Date  . Abdominal hysterectomy      Social History   Social History  . Marital Status: Single    Spouse Name: N/A  . Number of Children: N/A  . Years of Education: N/A   Occupational History  . Not on file.   Social History Main Topics  . Smoking status: Never Smoker   . Smokeless tobacco: Not on file  . Alcohol Use: No  . Drug Use: Not on file  . Sexual Activity: Not on file   Other Topics Concern  . Not on file   Social History Narrative    Family History  Problem Relation Age of Onset  . Cancer Mother     Current Outpatient Prescriptions  Medication Sig Dispense Refill  . albuterol (PROVENTIL HFA;VENTOLIN HFA) 108 (90 BASE) MCG/ACT inhaler Inhale 2 puffs into the lungs every 2 (two) hours as needed for wheezing or shortness of breath (cough). 1 Inhaler 0  . Alogliptin-Pioglitazone 12.5-30 MG TABS Take 1 tablet by mouth daily.    Marland Kitchen amLODipine (NORVASC) 10 MG tablet Take 10 mg by mouth daily.    Marland Kitchen atorvastatin (LIPITOR) 40 MG tablet Take 40 mg by mouth daily.    . chlorpheniramine-HYDROcodone (TUSSIONEX PENNKINETIC ER) 10-8 MG/5ML LQCR Take 5 mLs by mouth every 12 (twelve) hours as needed for cough (Cough). 115 mL 0  . Cholecalciferol (VITAMIN D PO) Take 1 tablet by mouth daily.    . cloNIDine (CATAPRES) 0.3 MG tablet Take 0.3 mg by  mouth 2 (two) times daily.    . febuxostat (ULORIC) 40 MG tablet Take 40 mg by mouth daily.    . furosemide (LASIX) 80 MG tablet Take 80 mg by mouth daily.    . Liraglutide (VICTOZA) 18 MG/3ML SOPN Inject 1.8 mLs into the skin daily.     . metoprolol succinate (TOPROL-XL) 100 MG 24 hr tablet Take 100 mg by mouth 2 (two) times daily. Take with or immediately following a meal.    . Multiple Vitamins-Minerals (MULTIVITAMINS THER. W/MINERALS) TABS tablet Take 1 tablet by mouth daily.    . predniSONE (DELTASONE) 20 MG tablet Take 2 tablets (40 mg total) by mouth daily. 10 tablet 0  . cephALEXin (KEFLEX) 500 MG capsule     . famciclovir (FAMVIR) 500 MG tablet     . levofloxacin (LEVAQUIN) 750 MG tablet Take 1 tablet (750 mg total) by mouth daily. (Patient not taking: Reported on 05/04/2015) 7 tablet 0  . OSENI 25-30 MG TABS      No current facility-administered medications for this visit.    No Known Allergies  REVIEW OF SYSTEMS:  (Positives checked otherwise negative)  CARDIOVASCULAR:   [ ]  chest pain,  [ ]  chest pressure,  [ ]  palpitations,  [ ]  shortness of breath when laying flat,  [x]  shortness of  breath with exertion,   [ ]  pain in feet when walking,  [ ]  pain in feet when laying flat, [ ]  history of blood clot in veins (DVT),  [ ]  history of phlebitis,  [ ]  swelling in legs,  [ ]  varicose veins  PULMONARY:   [x]  productive cough,  [ ]  asthma,  [ ]  wheezing  NEUROLOGIC:   [ ]  weakness in arms or legs,  [ ]  numbness in arms or legs,  [ ]  difficulty speaking or slurred speech,  [ ]  temporary loss of vision in one eye,  [ ]  dizziness  HEMATOLOGIC:   [ ]  bleeding problems,  [ ]  problems with blood clotting too easily  MUSCULOSKEL:   [x]  joint pain, [ ]  joint swelling  GASTROINTEST:   [ ]  vomiting blood,  [ ]  blood in stool     GENITOURINARY:   [ ]  burning with urination,  [ ]  blood in urine  PSYCHIATRIC:   [ ]  history of major depression  INTEGUMENTARY:   [  ] rashes,  [ ]  ulcers  CONSTITUTIONAL:   [ ]  fever,  [ ]  chills   Physical Examination  Filed Vitals:   05/04/15 0922 05/04/15 0928  BP: 193/70 184/85  Pulse: 61   Height: 5\' 6"  (1.676 m)   Weight: 385 lb (174.635 kg)   SpO2: 96%    Body mass index is 62.17 kg/(m^2).  General: A&O x 3, WD, morbidly obese  Head: /AT  Ear/Nose/Throat: Hearing grossly intact, nares w/o erythema or drainage, oropharynx w/o Erythema/Exudate, Mallampati score: 3  Eyes: PERRLA, EOMI  Neck: Supple, no nuchal rigidity, no palpable LAD  Pulmonary: Sym exp, good air movt, CTAB, no rales, rhonchi, & wheezing  Cardiac: RRR, Nl S1, S2, no Murmurs, rubs or gallops  Vascular: Vessel Right Left  Radial Palpable Palpable  Ulnar Faintly Palpable Faintly Palpable  Brachial Palpable Palpable  Carotid Palpable, without bruit Palpable, without bruit  Aorta Not palpable due to obesity N/A  Femoral Not Palpable due to pannus Not Palpable due to pannus  Popliteal Not palpable Not palpable  PT Not Palpable Not Palpable  DP Faintly Palpable Faintly Palpable   Gastrointestinal: soft, NTND, -G/R, - HSM, - masses, - CVAT B, large pannus  Musculoskeletal: M/S 5/5 throughout , Extremities without ischemic changes , chronic venous stasis skin changes, +LDS B  Neurologic: CN 2-12 intact , Pain and light touch intact in extremities , Motor exam as listed above  Psychiatric: Judgment intact, Mood & affect appropriate for pt's clinical situation  Dermatologic: See M/S exam for extremity exam, no rashes otherwise noted  Lymph : No Cervical, Axillary, or Inguinal lymphadenopathy    Non-Invasive Vascular Imaging  Vein Mapping  (Date: 05/04/2015):   R arm: acceptable vein conduits include possible cephalic (?thrombus), possible basilic  L arm: acceptable vein conduits include cephalic throughout, upper arm basilic  BUE Doppler (Date: 05/04/2015):   R arm: only brachial acceptable  L arm: only brachial  acceptable   Outside Studies/Documentation 5 pages of outside documents were reviewed including: outpatient nephrology chart with labs.   Medical Decision Making  [name] is a 59 y.o. female who presents with chronic kidney disease stage IV    Based on vein mapping and examination, this patient's permanent access options include: L BC AVF, L staged BVT, possible R BC AVF, possible R staged BVT  I had an extensive discussion with this patient in regards to the nature of access surgery, including risk,  benefits, and alternatives.    The patient is aware that the risks of access surgery include but are not limited to: bleeding, infection, steal syndrome, nerve damage, ischemic monomelic neuropathy, failure of access to mature, and possible need for additional access procedures in the future.  The patient has not agreed to proceed with the above procedure.  She is going to discuss things with her nephrologist.    Adele Barthel, MD [facility] Office: [phone] Pager: [phone]  05/04/2015, 10:43 AM

## 2015-06-22 ENCOUNTER — Telehealth: Payer: Self-pay

## 2015-06-22 DIAGNOSIS — E118 Type 2 diabetes mellitus with unspecified complications: Secondary | ICD-10-CM | POA: Diagnosis not present

## 2015-06-22 DIAGNOSIS — I10 Essential (primary) hypertension: Secondary | ICD-10-CM | POA: Diagnosis not present

## 2015-06-22 NOTE — Telephone Encounter (Signed)
Phone call to pt.  Asked if she had made decision re: AVF placement for dialysis.  Stated she is going to further discuss with Dr. Florene Glen, and will call back, after she gets his recommendation.

## 2015-06-28 DIAGNOSIS — E789 Disorder of lipoprotein metabolism, unspecified: Secondary | ICD-10-CM | POA: Diagnosis not present

## 2015-06-28 DIAGNOSIS — E118 Type 2 diabetes mellitus with unspecified complications: Secondary | ICD-10-CM | POA: Diagnosis not present

## 2015-06-28 DIAGNOSIS — I10 Essential (primary) hypertension: Secondary | ICD-10-CM | POA: Diagnosis not present

## 2015-07-04 DIAGNOSIS — N184 Chronic kidney disease, stage 4 (severe): Secondary | ICD-10-CM | POA: Diagnosis not present

## 2015-07-04 DIAGNOSIS — I10 Essential (primary) hypertension: Secondary | ICD-10-CM | POA: Diagnosis not present

## 2015-07-20 ENCOUNTER — Other Ambulatory Visit: Payer: Self-pay

## 2015-07-23 ENCOUNTER — Encounter (HOSPITAL_COMMUNITY): Payer: Self-pay | Admitting: *Deleted

## 2015-07-23 NOTE — Progress Notes (Signed)
Pt denies cardiac history and chest pain. States she sometimes has exertional sob. Pt is diabetic. Last A1C was 6.3 in  January. Pt states fasting blood sugar usually runs between 120-130. Instructed pt not to take her Victoza the day of surgery. Instructed pt to check blood sugar in the AM prior to arriving for surgery. Instructed her that if her blood sugar is 70 or below she needs to treat it with a 1/2 cup (4 oz.) of clear (apple or cranberry) juice and to recheck blood sugar 15 minutes after drinking the juice. If blood sugar still 70 or below, instructed pt to call the Short Stay dept and gave her the number. These instructions given per Diabetes Medication Adjustment Guidelines Prior to Procedure and Surgery. Pt voiced understanding.  Have requested most recent EKG and A1C results from Dr. Eugenio Hoes office.

## 2015-07-24 MED ORDER — SODIUM CHLORIDE 0.9 % IV SOLN: INTRAVENOUS | Status: DC

## 2015-07-24 MED ORDER — CHLORHEXIDINE GLUCONATE CLOTH 2 % EX PADS: 6.0000 | MEDICATED_PAD | Freq: Once | CUTANEOUS | Status: DC

## 2015-07-24 MED ORDER — CEFUROXIME SODIUM 1.5 G IJ SOLR
1.5000 g | INTRAMUSCULAR | Status: AC
  Administered 2015-07-25: 1.5 g via INTRAVENOUS
  Filled 2015-07-24: qty 1.5

## 2015-07-25 ENCOUNTER — Encounter (HOSPITAL_COMMUNITY)
Admission: RE | Disposition: A | Discharge status: Home / Self Care | Payer: Self-pay | Source: Ambulatory Visit | Attending: Vascular Surgery

## 2015-07-25 ENCOUNTER — Ambulatory Visit (HOSPITAL_COMMUNITY)
Admission: RE | Admit: 2015-07-25 | Discharge: 2015-07-25 | Disposition: A | Discharge status: Home / Self Care | Payer: Medicare Other | Source: Ambulatory Visit | Attending: Vascular Surgery | Admitting: Vascular Surgery

## 2015-07-25 ENCOUNTER — Ambulatory Visit (HOSPITAL_COMMUNITY): Payer: Medicare Other | Admitting: Anesthesiology

## 2015-07-25 ENCOUNTER — Encounter (HOSPITAL_COMMUNITY): Payer: Self-pay | Admitting: *Deleted

## 2015-07-25 DIAGNOSIS — Z79899 Other long term (current) drug therapy: Secondary | ICD-10-CM | POA: Diagnosis not present

## 2015-07-25 DIAGNOSIS — Z7984 Long term (current) use of oral hypoglycemic drugs: Secondary | ICD-10-CM | POA: Insufficient documentation

## 2015-07-25 DIAGNOSIS — I120 Hypertensive chronic kidney disease with stage 5 chronic kidney disease or end stage renal disease: Secondary | ICD-10-CM | POA: Diagnosis not present

## 2015-07-25 DIAGNOSIS — N184 Chronic kidney disease, stage 4 (severe): Secondary | ICD-10-CM

## 2015-07-25 DIAGNOSIS — E7800 Pure hypercholesterolemia, unspecified: Secondary | ICD-10-CM | POA: Insufficient documentation

## 2015-07-25 DIAGNOSIS — G4730 Sleep apnea, unspecified: Secondary | ICD-10-CM | POA: Insufficient documentation

## 2015-07-25 DIAGNOSIS — N186 End stage renal disease: Secondary | ICD-10-CM | POA: Diagnosis not present

## 2015-07-25 DIAGNOSIS — N185 Chronic kidney disease, stage 5: Secondary | ICD-10-CM | POA: Diagnosis not present

## 2015-07-25 DIAGNOSIS — M1990 Unspecified osteoarthritis, unspecified site: Secondary | ICD-10-CM | POA: Diagnosis not present

## 2015-07-25 DIAGNOSIS — Z86718 Personal history of other venous thrombosis and embolism: Secondary | ICD-10-CM | POA: Insufficient documentation

## 2015-07-25 DIAGNOSIS — E1122 Type 2 diabetes mellitus with diabetic chronic kidney disease: Secondary | ICD-10-CM | POA: Diagnosis not present

## 2015-07-25 HISTORY — DX: Unspecified convulsions: R56.9

## 2015-07-25 HISTORY — DX: Personal history of other venous thrombosis and embolism: Z86.718

## 2015-07-25 HISTORY — PX: AV FISTULA PLACEMENT: SHX1204

## 2015-07-25 HISTORY — DX: Sleep apnea, unspecified: G47.30

## 2015-07-25 HISTORY — DX: Unspecified osteoarthritis, unspecified site: M19.90

## 2015-07-25 LAB — POCT I-STAT 4, (NA,K, GLUC, HGB,HCT)
GLUCOSE: 129 mg/dL — AB (ref 65–99)
HEMATOCRIT: 31 % — AB (ref 36.0–46.0)
HEMOGLOBIN: 10.5 g/dL — AB (ref 12.0–15.0)
Potassium: 4.1 mmol/L (ref 3.5–5.1)
Sodium: 147 mmol/L — ABNORMAL HIGH (ref 135–145)

## 2015-07-25 LAB — GLUCOSE, CAPILLARY
GLUCOSE-CAPILLARY: 119 mg/dL — AB (ref 65–99)
Glucose-Capillary: 113 mg/dL — ABNORMAL HIGH (ref 65–99)

## 2015-07-25 SURGERY — ARTERIOVENOUS (AV) FISTULA CREATION
Anesthesia: Monitor Anesthesia Care | Site: Arm Upper | Laterality: Left

## 2015-07-25 MED ORDER — SODIUM CHLORIDE 0.9 % IJ SOLN
INTRAMUSCULAR | Status: AC
  Filled 2015-07-25: qty 10

## 2015-07-25 MED ORDER — LIDOCAINE HCL (CARDIAC) 20 MG/ML IV SOLN
INTRAVENOUS | Status: AC
  Filled 2015-07-25: qty 5

## 2015-07-25 MED ORDER — FENTANYL CITRATE (PF) 250 MCG/5ML IJ SOLN
INTRAMUSCULAR | Status: AC
  Filled 2015-07-25: qty 5

## 2015-07-25 MED ORDER — LIDOCAINE HCL (PF) 1 % IJ SOLN
INTRAMUSCULAR | Status: DC | PRN
Start: 2015-07-25 — End: 2015-07-25
  Administered 2015-07-25: 30 mL

## 2015-07-25 MED ORDER — LIDOCAINE HCL (PF) 1 % IJ SOLN
INTRAMUSCULAR | Status: AC
  Filled 2015-07-25: qty 30

## 2015-07-25 MED ORDER — PROPOFOL 500 MG/50ML IV EMUL
INTRAVENOUS | Status: DC | PRN
  Administered 2015-07-25: 25 ug/kg/min via INTRAVENOUS

## 2015-07-25 MED ORDER — SUCCINYLCHOLINE CHLORIDE 20 MG/ML IJ SOLN
INTRAMUSCULAR | Status: AC
  Filled 2015-07-25: qty 1

## 2015-07-25 MED ORDER — FENTANYL CITRATE (PF) 100 MCG/2ML IJ SOLN
INTRAMUSCULAR | Status: DC | PRN
  Administered 2015-07-25 (×2): 25 ug via INTRAVENOUS

## 2015-07-25 MED ORDER — SODIUM CHLORIDE 0.9 % IV SOLN
INTRAVENOUS | Status: DC | PRN
  Administered 2015-07-25: 500 mL

## 2015-07-25 MED ORDER — PROPOFOL 10 MG/ML IV BOLUS
INTRAVENOUS | Status: AC
  Filled 2015-07-25: qty 20

## 2015-07-25 MED ORDER — ONDANSETRON HCL 4 MG/2ML IJ SOLN
INTRAMUSCULAR | Status: AC
  Filled 2015-07-25: qty 2

## 2015-07-25 MED ORDER — 0.9 % SODIUM CHLORIDE (POUR BTL) OPTIME
TOPICAL | Status: DC | PRN
  Administered 2015-07-25: 1000 mL

## 2015-07-25 MED ORDER — LIDOCAINE HCL (CARDIAC) 20 MG/ML IV SOLN
INTRAVENOUS | Status: DC | PRN
  Administered 2015-07-25: 50 mg via INTRAVENOUS

## 2015-07-25 MED ORDER — EPHEDRINE SULFATE 50 MG/ML IJ SOLN
INTRAMUSCULAR | Status: AC
  Filled 2015-07-25: qty 1

## 2015-07-25 MED ORDER — SODIUM CHLORIDE 0.9 % IV SOLN
INTRAVENOUS | Status: DC | PRN
  Administered 2015-07-25: 08:00:00 via INTRAVENOUS

## 2015-07-25 MED ORDER — ROCURONIUM BROMIDE 50 MG/5ML IV SOLN
INTRAVENOUS | Status: AC
  Filled 2015-07-25: qty 1

## 2015-07-25 MED ORDER — MIDAZOLAM HCL 2 MG/2ML IJ SOLN
INTRAMUSCULAR | Status: AC
  Filled 2015-07-25: qty 2

## 2015-07-25 MED ORDER — MIDAZOLAM HCL 5 MG/5ML IJ SOLN
INTRAMUSCULAR | Status: DC | PRN
  Administered 2015-07-25 (×2): 1 mg via INTRAVENOUS

## 2015-07-25 MED ORDER — OXYCODONE-ACETAMINOPHEN 5-325 MG PO TABS: 1.0000 | ORAL_TABLET | Freq: Four times a day (QID) | ORAL | Status: DC | PRN

## 2015-07-25 MED ORDER — ONDANSETRON HCL 4 MG/2ML IJ SOLN
INTRAMUSCULAR | Status: DC | PRN
  Administered 2015-07-25: 4 mg via INTRAVENOUS

## 2015-07-25 SURGICAL SUPPLY — 33 items
ARMBAND PINK RESTRICT EXTREMIT (MISCELLANEOUS) ×3 IMPLANT
CANISTER SUCTION 2500CC (MISCELLANEOUS) ×3 IMPLANT
CLIP TI MEDIUM 6 (CLIP) ×3 IMPLANT
CLIP TI WIDE RED SMALL 6 (CLIP) ×3 IMPLANT
COVER PROBE W GEL 5X96 (DRAPES) ×3 IMPLANT
DECANTER SPIKE VIAL GLASS SM (MISCELLANEOUS) ×3 IMPLANT
ELECT REM PT RETURN 9FT ADLT (ELECTROSURGICAL) ×3
ELECTRODE REM PT RTRN 9FT ADLT (ELECTROSURGICAL) ×1 IMPLANT
GLOVE BIO SURGEON STRL SZ 6.5 (GLOVE) ×2 IMPLANT
GLOVE BIO SURGEON STRL SZ7 (GLOVE) ×3 IMPLANT
GLOVE BIO SURGEON STRL SZ8 (GLOVE) ×3 IMPLANT
GLOVE BIO SURGEONS STRL SZ 6.5 (GLOVE) ×1
GLOVE BIOGEL PI IND STRL 6.5 (GLOVE) ×2 IMPLANT
GLOVE BIOGEL PI IND STRL 7.5 (GLOVE) ×2 IMPLANT
GLOVE BIOGEL PI INDICATOR 6.5 (GLOVE) ×4
GLOVE BIOGEL PI INDICATOR 7.5 (GLOVE) ×4
GLOVE ECLIPSE 7.0 STRL STRAW (GLOVE) ×3 IMPLANT
GOWN STRL REUS W/ TWL LRG LVL3 (GOWN DISPOSABLE) ×3 IMPLANT
GOWN STRL REUS W/TWL LRG LVL3 (GOWN DISPOSABLE) ×6
HEMOSTAT SPONGE AVITENE ULTRA (HEMOSTASIS) IMPLANT
KIT BASIN OR (CUSTOM PROCEDURE TRAY) ×3 IMPLANT
KIT ROOM TURNOVER OR (KITS) ×3 IMPLANT
LIQUID BAND (GAUZE/BANDAGES/DRESSINGS) ×3 IMPLANT
NS IRRIG 1000ML POUR BTL (IV SOLUTION) ×3 IMPLANT
PACK CV ACCESS (CUSTOM PROCEDURE TRAY) ×3 IMPLANT
PAD ARMBOARD 7.5X6 YLW CONV (MISCELLANEOUS) ×6 IMPLANT
SUT MNCRL AB 4-0 PS2 18 (SUTURE) ×3 IMPLANT
SUT PROLENE 6 0 BV (SUTURE) ×3 IMPLANT
SUT PROLENE 7 0 BV 1 (SUTURE) ×3 IMPLANT
SUT VIC AB 3-0 SH 27 (SUTURE) ×2
SUT VIC AB 3-0 SH 27X BRD (SUTURE) ×1 IMPLANT
UNDERPAD 30X30 INCONTINENT (UNDERPADS AND DIAPERS) ×3 IMPLANT
WATER STERILE IRR 1000ML POUR (IV SOLUTION) ×3 IMPLANT

## 2015-07-25 NOTE — Op Note (Signed)
OPERATIVE NOTE   PROCEDURE: left brachiocephalic arteriovenous fistula placement  PRE-OPERATIVE DIAGNOSIS: chronic kidney disease stage IV-v   POST-OPERATIVE DIAGNOSIS: same as above   SURGEON: Adele Barthel, MD  ASSISTANT(S): Gerri Lins, PAC   ANESTHESIA: local and MAC  ESTIMATED BLOOD LOSS: 50 cc  FINDING(S): 1.  Large cephalic vein (4-5 mm) 2.  Palpable brachial artery with minimal-moderate atherosclerosis 3.  Strong thrill at end of case with faintly palpable radial pulse  SPECIMEN(S):  none  INDICATIONS:   [name] is a 60 y.o. female who presents with chronic kidney disease stage IV-V.  The patient is scheduled for left brachiocephalic arteriovenous fistula placement.  The patient is aware the risks include but are not limited to: bleeding, infection, steal syndrome, nerve damage, ischemic monomelic neuropathy, failure to mature, and need for additional procedures.  The patient is aware of the risks of the procedure and elects to proceed forward.  DESCRIPTION: After full informed written consent was obtained from the patient, the patient was brought back to the operating room and placed supine upon the operating table.  Prior to induction, the patient received IV antibiotics.   After obtaining adequate anesthesia, the patient was then prepped and draped in the standard fashion for a left arm access procedure.  I turned my attention first to identifying the patient's cephalic vein and brachial artery.  Using SonoSite guidance, the location of these vessels were marked out on the skin.   At this point, I injected local anesthetic to obtain a field block of the antecubitum.  In total, I injected about 10 mL of 1% lidocaine without epinephrine.  I made a transverse incision at the level of the antecubitum and dissected through the subcutaneous tissue and fascia to gain exposure of the brachial artery.  This was noted to be 3 mm in diameter externally.  This was dissected  out proximally and distally and controlled with vessel loops .  I then dissected out the cephalic vein.  This was noted to be 4-5 mm in diameter externally.  The distal segment of the vein was ligated with a  2-0 silk, and the vein was transected.  The proximal segment was interrogated with serial dilators.  The vein accepted up to a 5 mm dilator without any difficulty.  I then instilled the heparinized saline into the vein and clamped it.  At this point, I reset my exposure of the brachial artery and placed the artery under tension proximally and distally.  I made an arteriotomy with a #11 blade, and then I extended the arteriotomy with a Potts scissor.  I injected heparinized saline proximal and distal to this arteriotomy.  The vein was then sewn to the artery in an end-to-side configuration with a running stitch of 7-0 Prolene.  Prior to completing this anastomosis, I allowed the vein and artery to backbleed.  There was no evidence of clot from any vessels.  I completed the anastomosis in the usual fashion and then released all vessel loops and clamps.  There was a palpable  thrill in the venous outflow, and there was a faintly palpable radial pulse.  At this point, I irrigated out the surgical wound.  There was no further active bleeding.  The subcutaneous tissue was reapproximated with a running stitch of 3-0 Vicryl.  The skin was then reapproximated with a running subcuticular stitch of 4-0 Vicryl.  The skin was then cleaned, dried, and reinforced with Dermabond.  The patient tolerated this procedure well.  COMPLICATIONS: none  CONDITION: stable   Adele Barthel, MD [facility] Office: [phone] Pager: [phone]  07/25/2015, 9:44 AM

## 2015-07-25 NOTE — Anesthesia Preprocedure Evaluation (Addendum)
Anesthesia Evaluation  Patient identified by MRN, date of birth, ID band Patient awake    Reviewed: Allergy & Precautions, NPO status , Patient's Chart, lab work & pertinent test results  History of Anesthesia Complications Negative for: history of anesthetic complications  Airway Mallampati: IV  TM Distance: >3 FB Neck ROM: Full    Dental  (+) Dental Advisory Given, Missing, Poor Dentition   Pulmonary shortness of breath and with exertion, sleep apnea and Continuous Positive Airway Pressure Ventilation ,    Pulmonary exam normal breath sounds clear to auscultation       Cardiovascular Exercise Tolerance: Poor hypertension, Pt. on medications and Pt. on home beta blockers + DVT  Normal cardiovascular exam Rhythm:Regular Rate:Normal     Neuro/Psych Seizures -,  negative psych ROS   GI/Hepatic negative GI ROS, Neg liver ROS,   Endo/Other  diabetes, Type 2, Oral Hypoglycemic Agents  Renal/GU Renal InsufficiencyRenal disease     Musculoskeletal  (+) Arthritis ,   Abdominal   Peds  Hematology negative hematology ROS (+) K+ 4.1   Anesthesia Other Findings Day of surgery medications reviewed with the patient.  Reproductive/Obstetrics                          Anesthesia Physical Anesthesia Plan  ASA: III  Anesthesia Plan: MAC   Post-op Pain Management:    Induction: Intravenous  Airway Management Planned: Nasal Cannula  Additional Equipment:   Intra-op Plan:   Post-operative Plan:   Informed Consent: I have reviewed the patients History and Physical, chart, labs and discussed the procedure including the risks, benefits and alternatives for the proposed anesthesia with the patient or authorized representative who has indicated his/her understanding and acceptance.   Dental advisory given  Plan Discussed with: CRNA  Anesthesia Plan Comments: (Discussed risks/benefits/alternatives to  MAC sedation including need for ventilatory support, hypotension, need for conversion to general anesthesia.  All patient questions answered.  Patient wished to proceed.)        Anesthesia Quick Evaluation

## 2015-07-25 NOTE — Transfer of Care (Signed)
Immediate Anesthesia Transfer of Care Note  Patient: [name]  Procedure(s) Performed: Procedure(s): ARTERIOVENOUS (AV) FISTULA CREATION -LEFT BRACHIOCEPHALIC (Left)  Patient Location: PACU  Anesthesia Type:MAC  Level of Consciousness: awake, alert , oriented and patient cooperative  Airway & Oxygen Therapy: Patient Spontanous Breathing and Patient connected to nasal cannula oxygen  Post-op Assessment: Report given to RN, Post -op Vital signs reviewed and stable and Patient moving all extremities  Post vital signs: Reviewed and stable  Last Vitals:  Filed Vitals:   07/25/15 0706  BP: 166/84  Pulse: 86  Temp: 36.4 C  Resp: 18    Complications: No apparent anesthesia complications

## 2015-07-25 NOTE — Anesthesia Postprocedure Evaluation (Signed)
Anesthesia Post Note  Patient: [name]  Procedure(s) Performed: Procedure(s) (LRB): ARTERIOVENOUS (AV) FISTULA CREATION -LEFT BRACHIOCEPHALIC (Left)  Patient location during evaluation: PACU Anesthesia Type: MAC Level of consciousness: awake and alert Pain management: pain level controlled Vital Signs Assessment: post-procedure vital signs reviewed and stable Respiratory status: spontaneous breathing, nonlabored ventilation, respiratory function stable and patient connected to nasal cannula oxygen Cardiovascular status: stable and blood pressure returned to baseline Anesthetic complications: no    Last Vitals:  Filed Vitals:   07/25/15 1056 07/25/15 1107  BP:  153/88  Pulse: 72 79  Temp: 36.2 C   Resp: 13 14    Last Pain:  Filed Vitals:   07/25/15 1109  PainSc: 0-No pain                 Catalina Gravel

## 2015-07-25 NOTE — H&P (Signed)
Brief History and Physical  History of Present Illness  [name] is a 60 y.o. female who presents with chief complaint: CKD IV-V.  The patient presents today for L [facility] AVF placement.    Past Medical History  Diagnosis Date  . Hypertension   . Kidney disease   . High cholesterol   . Shortness of breath dyspnea     with exertion  . H/O blood clots     many years ago in leg  . Sleep apnea     uses c-pap most of time  . Pneumonia   . Diabetes mellitus without complication ([place])     type 2  . Seizures ([place])     had one or two (more than 10-15 years ago)  . Arthritis   . Constipation     Past Surgical History  Procedure Laterality Date  . Abdominal hysterectomy    . Colonoscopy      Social History   Social History  . Marital Status: Single    Spouse Name: N/A  . Number of Children: N/A  . Years of Education: N/A   Occupational History  . Not on file.   Social History Main Topics  . Smoking status: Never Smoker   . Smokeless tobacco: Never Used  . Alcohol Use: No  . Drug Use: No  . Sexual Activity: Not on file   Other Topics Concern  . Not on file   Social History Narrative    Family History  Problem Relation Age of Onset  . Cancer Mother   . Hypertension Father     No current facility-administered medications on file prior to encounter.   Current Outpatient Prescriptions on File Prior to Encounter  Medication Sig Dispense Refill  . amLODipine (NORVASC) 10 MG tablet Take 10 mg by mouth at bedtime.     Marland Kitchen atorvastatin (LIPITOR) 40 MG tablet Take 40 mg by mouth daily.    . Cholecalciferol (VITAMIN D PO) Take 1 tablet by mouth daily.    . cloNIDine (CATAPRES) 0.3 MG tablet Take 0.3 mg by mouth 2 (two) times daily.    . febuxostat (ULORIC) 40 MG tablet Take 40 mg by mouth daily.    . furosemide (LASIX) 80 MG tablet Take 80 mg by mouth daily.    . Liraglutide (VICTOZA) 18 MG/3ML SOPN Inject 1.8 mg into the skin daily.     . metoprolol  succinate (TOPROL-XL) 100 MG 24 hr tablet Take 100 mg by mouth 2 (two) times daily. Take with or immediately following a meal.    . Multiple Vitamins-Minerals (MULTIVITAMINS THER. W/MINERALS) TABS tablet Take 1 tablet by mouth daily.      No Known Allergies  Review of Systems: As listed above, otherwise negative.  Physical Examination  Filed Vitals:   07/25/15 0706  BP: 166/84  Pulse: 86  Temp: 97.6 F (36.4 C)  TempSrc: Oral  Resp: 18  Height: 5\' 6"  (1.676 m)  Weight: 365 lb (165.563 kg)  SpO2: 20%    General: A&O x 3, WDWN  Pulmonary: Sym exp, good air movt, CTAB, no rales, rhonchi, & wheezing  Cardiac: RRR, Nl S1, S2, no Murmurs, rubs or gallops  Gastrointestinal: soft, NTND, -G/R, - HSM, - masses, - CVAT B  Musculoskeletal: M/S 5/5 throughout , Extremities without ischemic changes   Laboratory See Fallbrook is a 60 y.o. female who presents with: CKD Stage IV-V.   The patient  is scheduled for: L BC AVF  Risk, benefits, and alternatives to access surgery were discussed.  The patient is aware the risks include but are not limited to: bleeding, infection, steal syndrome, nerve damage, ischemic monomelic neuropathy, failure to mature, and need for additional procedures.  The patient is aware of the risks and agrees to proceed.  Adele Barthel, MD [facility] Office: [phone] Pager: [phone]  07/25/2015, 7:40 AM

## 2015-07-26 ENCOUNTER — Encounter (HOSPITAL_COMMUNITY): Payer: Self-pay | Admitting: Vascular Surgery

## 2015-07-27 ENCOUNTER — Telehealth: Payer: Self-pay | Admitting: Vascular Surgery

## 2015-07-27 NOTE — Telephone Encounter (Signed)
-----   Message from Mena Goes, RN sent at 07/25/2015 10:17 AM EST ----- Regarding: schedule   ----- Message -----    From: Ulyses Amor, PA-C    Sent: 07/25/2015   9:57 AM      To: Vvs Charge Pool  F/U with Dr. Bridgett Larsson in 6 weeks no study needed.  S/p left av fistula

## 2015-07-27 NOTE — Telephone Encounter (Signed)
Spoke with pt, dpm °

## 2015-08-01 ENCOUNTER — Telehealth: Payer: Self-pay | Admitting: Vascular Surgery

## 2015-08-01 NOTE — Telephone Encounter (Signed)
Pt is calling for the second time and has questions about her fistula.  Please call pt [phone], Thanks.. Ad

## 2015-08-01 NOTE — Telephone Encounter (Signed)
Talked to patient, she only had questions regarding cleaning arm. I explained to her about the Dermabond dressing applied at surgery.

## 2015-09-07 ENCOUNTER — Encounter: Payer: Self-pay | Admitting: Vascular Surgery

## 2015-09-12 ENCOUNTER — Encounter: Payer: Self-pay | Admitting: Vascular Surgery

## 2015-09-19 ENCOUNTER — Ambulatory Visit (INDEPENDENT_AMBULATORY_CARE_PROVIDER_SITE_OTHER): Payer: Medicare Other | Admitting: Vascular Surgery

## 2015-09-19 ENCOUNTER — Encounter: Payer: Self-pay | Admitting: Vascular Surgery

## 2015-09-19 VITALS — BP 180/81 | HR 74 | Ht 66.0 in | Wt 397.9 lb

## 2015-09-19 DIAGNOSIS — N184 Chronic kidney disease, stage 4 (severe): Secondary | ICD-10-CM

## 2015-09-19 NOTE — Progress Notes (Signed)
    Postoperative Access Visit   History of Present Illness  [name] is a 60 y.o. year old female who presents for postoperative follow-up for: L BC AVF (Date: 07/25/15).  The patient's wounds are healed.  The patient notes no steal symptoms.  The patient is able to complete their activities of daily living.  The patient's current symptoms are: none.  For VQI Use Only  PRE-ADM LIVING: Home  AMB STATUS: Ambulatory  Physical Examination Filed Vitals:   09/19/15 0858 09/19/15 0900  BP: 187/84 180/81  Pulse: 74     LUE: Incision is healed, skin feels warm, hand grip is 5/5, sensation in digits is intact, palpable thrill, bruit can be auscultated, on Sonosite> 6 mm throughout, fistula deep >1 cm for >75% of length  Medical Decision Making  [name] is a 60 y.o. year old female who presents s/p L BC AVF.   Due to the depth of the fistula, she will need superficialization of the fistula prior to use.  The patient would like to defer immediate surgery, so will hold off until she is ready to proceed.  Thank you for allowing [place] to participate in this patient's care.  Adele Barthel, MD [facility] Office: [phone] Pager: [phone]  09/19/2015, 10:40 AM

## 2015-09-28 DIAGNOSIS — I10 Essential (primary) hypertension: Secondary | ICD-10-CM | POA: Diagnosis not present

## 2015-09-28 DIAGNOSIS — R0602 Shortness of breath: Secondary | ICD-10-CM | POA: Diagnosis not present

## 2015-09-28 DIAGNOSIS — R609 Edema, unspecified: Secondary | ICD-10-CM | POA: Diagnosis not present

## 2015-09-28 DIAGNOSIS — E118 Type 2 diabetes mellitus with unspecified complications: Secondary | ICD-10-CM | POA: Diagnosis not present

## 2015-10-03 ENCOUNTER — Ambulatory Visit (INDEPENDENT_AMBULATORY_CARE_PROVIDER_SITE_OTHER): Payer: Medicare Other | Admitting: Interventional Cardiology

## 2015-10-03 ENCOUNTER — Encounter: Payer: Self-pay | Admitting: Interventional Cardiology

## 2015-10-03 VITALS — BP 140/80 | HR 88 | Ht 66.0 in | Wt >= 6400 oz

## 2015-10-03 DIAGNOSIS — R600 Localized edema: Secondary | ICD-10-CM | POA: Diagnosis not present

## 2015-10-03 DIAGNOSIS — R0600 Dyspnea, unspecified: Secondary | ICD-10-CM | POA: Diagnosis not present

## 2015-10-03 DIAGNOSIS — R0609 Other forms of dyspnea: Secondary | ICD-10-CM | POA: Insufficient documentation

## 2015-10-03 DIAGNOSIS — N184 Chronic kidney disease, stage 4 (severe): Secondary | ICD-10-CM

## 2015-10-03 NOTE — Patient Instructions (Signed)
Medication Instructions:  Same-no changes  Labwork: None  Testing/Procedures: Your physician has requested that you have an echocardiogram. Echocardiography is a painless test that uses sound waves to create images of your heart. It provides your doctor with information about the size and shape of your heart and how well your heart's chambers and valves are working. This procedure takes approximately one hour. There are no restrictions for this procedure.   Follow-Up: To be determined   Any Other Special Instructions Will Be Listed Below (If Applicable).     If you need a refill on your cardiac medications before your next appointment, please call your pharmacy.

## 2015-10-03 NOTE — Progress Notes (Signed)
Patient ID: [name], female   DOB: Oct 23, 1955, 60 y.o.   MRN: [id number]     Cardiology Office Note   Date:  10/03/2015   ID:  [name], DOB 12/24/1955, MRN [id number]  PCP:  Dwan Bolt, MD    No chief complaint on file. CHF   Wt Readings from Last 3 Encounters:  10/03/15 412 lb (186.882 kg)  09/19/15 397 lb 14.4 oz (180.486 kg)  07/25/15 365 lb (165.563 kg)       History of Present Illness: [name] is a 60 y.o. female  with morbid obesity and stage IV chronic kidney disease who presents for heart failure symptoms. She has had fluid retention over the past month. Her weight is up 50 pounds in the last 2-1/2 months. She had an AV fistula placed in anticipation of dialysis for her chronic kidney disease.  She notes increased leg swelling. She continues to sleep on 2 pillows. She is quite limited by her obesity. She denies any chest discomfort, pain or tightness.    Past Medical History  Diagnosis Date  . Hypertension   . Kidney disease   . High cholesterol   . Shortness of breath dyspnea     with exertion  . H/O blood clots     many years ago in leg  . Sleep apnea     uses c-pap most of time  . Pneumonia   . Diabetes mellitus without complication ([place])     type 2  . Seizures ([place])     had one or two (more than 10-15 years ago)  . Arthritis   . Constipation     Past Surgical History  Procedure Laterality Date  . Abdominal hysterectomy    . Colonoscopy    . Av fistula placement Left 07/25/2015    Procedure: ARTERIOVENOUS (AV) FISTULA CREATION -LEFT BRACHIOCEPHALIC;  Surgeon: Conrad [place], MD;  Location: [facility];  Service: Vascular;  Laterality: Left;     Current Outpatient Prescriptions  Medication Sig Dispense Refill  . amLODipine (NORVASC) 10 MG tablet Take 10 mg by mouth at bedtime.     Marland Kitchen aspirin 81 MG tablet Take 81 mg by mouth daily.    Marland Kitchen atorvastatin (LIPITOR) 40 MG tablet Take 40 mg by mouth daily.    . Cholecalciferol (VITAMIN  D PO) Take 1 tablet by mouth daily.    . cloNIDine (CATAPRES) 0.3 MG tablet Take 0.3 mg by mouth 2 (two) times daily.    . febuxostat (ULORIC) 40 MG tablet Take 40 mg by mouth daily.    . furosemide (LASIX) 80 MG tablet Take 80 mg by mouth daily.    . Liraglutide (VICTOZA) 18 MG/3ML SOPN Inject 1.8 mg into the skin daily.     . metoprolol succinate (TOPROL-XL) 100 MG 24 hr tablet Take 100 mg by mouth 2 (two) times daily. Take with or immediately following a meal.    . Multiple Vitamins-Minerals (MULTIVITAMINS THER. W/MINERALS) TABS tablet Take 1 tablet by mouth daily.    . sacubitril-valsartan (ENTRESTO) 24-26 MG Take 1 tablet by mouth 2 (two) times daily.     No current facility-administered medications for this visit.    Allergies:   Review of patient's allergies indicates no known allergies.    Social History:  The patient  reports that she has never smoked. She has never used smokeless tobacco. She reports that she does not drink alcohol or use illicit drugs.   Family History:  The patient's  family history includes Cancer in her mother; Heart attack in her paternal aunt and sister; Hypertension in her father. There is no history of Stroke.    ROS:  Please see the history of present illness.   Otherwise, review of systems are positive for Weight gain and lower extremity swelling. Mild DOE.  All other systems are reviewed and negative.    PHYSICAL EXAM: VS:  BP 140/80 mmHg  Pulse 88  Ht 5\' 6"  (1.676 m)  Wt 412 lb (186.882 kg)  BMI 66.53 kg/m2 , BMI Body mass index is 66.53 kg/(m^2). GEN: Well nourished, well developed, in no acute distress HEENT: normal Neck: no JVD, carotid bruits, or masses Cardiac: RRR; no murmurs, rubs, or gallops, 2+ bilateral lower extremity pitting edema  Respiratory:  clear to auscultation bilaterally, normal work of breathing GI: soft, nontender, nondistended, + BS MS: no deformity or atrophy, Right side of abdomen is larger than left. This is  chronic. Skin: warm and dry, no rash Neuro:  Strength and sensation are intact Psych: euthymic mood, full affect   EKG:   The ekg ordered in 2/17 demonstrates NSR, no ST segment changes   Recent Labs: 07/25/2015: Hemoglobin 10.5*; Potassium 4.1; Sodium 147*   Lipid Panel No results found for: CHOL, TRIG, HDL, CHOLHDL, VLDL, LDLCALC, LDLDIRECT   Other studies Reviewed: Additional studies/ records that were reviewed today with results demonstrating: ECG reviewed.   ASSESSMENT AND PLAN:  1. Volume overload:  At this point, she is not on dialysis. She will need increased Lasix. I would like her nephrologist to be aware of this before we started Lasix 80 mg twice a day rather than just once a day.  Check echocardiogram to evaluate for any LV dysfunction contributing to the volume overload. 2. Chronic kidney disease: Fistula in place but another procedure will be needed to make it more superficial. 3. Morbid obesity: This is a major issue for her which is also likely contributing to her leg swelling.   Current medicines are reviewed at length with the patient today.  The patient concerns regarding her medicines were addressed.  The following changes have been made:  No change until we hear back from Dr. Florene Glen. She will need follow-up electrolytes once that changes made.  Labs/ tests ordered today include:  No orders of the defined types were placed in this encounter.    Recommend 150 minutes/week of aerobic exercise Low fat, low carb, high fiber diet recommended  Disposition:   FU for echo   Teresita Madura., MD  10/03/2015 11:25 AM    [address], [place], [place]  [address] Phone: [phone]; Fax: [phone]

## 2015-10-04 ENCOUNTER — Emergency Department (HOSPITAL_COMMUNITY): Payer: Medicare Other

## 2015-10-04 ENCOUNTER — Telehealth: Payer: Self-pay | Admitting: Interventional Cardiology

## 2015-10-04 ENCOUNTER — Encounter (HOSPITAL_COMMUNITY): Payer: Self-pay

## 2015-10-04 ENCOUNTER — Inpatient Hospital Stay (HOSPITAL_COMMUNITY)
Admission: EM | Admit: 2015-10-04 | Discharge: 2015-11-06 | DRG: 682 | Disposition: A | Discharge status: Skilled Nursing Facility | Payer: Medicare Other | Attending: Internal Medicine | Admitting: Internal Medicine

## 2015-10-04 DIAGNOSIS — K5900 Constipation, unspecified: Secondary | ICD-10-CM | POA: Diagnosis not present

## 2015-10-04 DIAGNOSIS — M109 Gout, unspecified: Secondary | ICD-10-CM | POA: Diagnosis present

## 2015-10-04 DIAGNOSIS — M1990 Unspecified osteoarthritis, unspecified site: Secondary | ICD-10-CM | POA: Diagnosis not present

## 2015-10-04 DIAGNOSIS — R109 Unspecified abdominal pain: Secondary | ICD-10-CM

## 2015-10-04 DIAGNOSIS — I5033 Acute on chronic diastolic (congestive) heart failure: Secondary | ICD-10-CM | POA: Diagnosis not present

## 2015-10-04 DIAGNOSIS — E11649 Type 2 diabetes mellitus with hypoglycemia without coma: Secondary | ICD-10-CM | POA: Diagnosis not present

## 2015-10-04 DIAGNOSIS — J810 Acute pulmonary edema: Secondary | ICD-10-CM | POA: Diagnosis not present

## 2015-10-04 DIAGNOSIS — E870 Hyperosmolality and hypernatremia: Secondary | ICD-10-CM | POA: Diagnosis present

## 2015-10-04 DIAGNOSIS — I132 Hypertensive heart and chronic kidney disease with heart failure and with stage 5 chronic kidney disease, or end stage renal disease: Secondary | ICD-10-CM | POA: Diagnosis not present

## 2015-10-04 DIAGNOSIS — J9601 Acute respiratory failure with hypoxia: Secondary | ICD-10-CM

## 2015-10-04 DIAGNOSIS — K921 Melena: Secondary | ICD-10-CM

## 2015-10-04 DIAGNOSIS — R0602 Shortness of breath: Secondary | ICD-10-CM | POA: Diagnosis not present

## 2015-10-04 DIAGNOSIS — E872 Acidosis: Secondary | ICD-10-CM | POA: Diagnosis present

## 2015-10-04 DIAGNOSIS — N179 Acute kidney failure, unspecified: Secondary | ICD-10-CM | POA: Diagnosis not present

## 2015-10-04 DIAGNOSIS — N182 Chronic kidney disease, stage 2 (mild): Secondary | ICD-10-CM | POA: Diagnosis not present

## 2015-10-04 DIAGNOSIS — Z992 Dependence on renal dialysis: Secondary | ICD-10-CM

## 2015-10-04 DIAGNOSIS — Z452 Encounter for adjustment and management of vascular access device: Secondary | ICD-10-CM

## 2015-10-04 DIAGNOSIS — R0902 Hypoxemia: Secondary | ICD-10-CM

## 2015-10-04 DIAGNOSIS — D649 Anemia, unspecified: Secondary | ICD-10-CM | POA: Diagnosis not present

## 2015-10-04 DIAGNOSIS — R7989 Other specified abnormal findings of blood chemistry: Secondary | ICD-10-CM | POA: Diagnosis not present

## 2015-10-04 DIAGNOSIS — G9341 Metabolic encephalopathy: Secondary | ICD-10-CM | POA: Diagnosis not present

## 2015-10-04 DIAGNOSIS — E662 Morbid (severe) obesity with alveolar hypoventilation: Secondary | ICD-10-CM | POA: Diagnosis present

## 2015-10-04 DIAGNOSIS — E1122 Type 2 diabetes mellitus with diabetic chronic kidney disease: Secondary | ICD-10-CM | POA: Diagnosis present

## 2015-10-04 DIAGNOSIS — R059 Cough, unspecified: Secondary | ICD-10-CM

## 2015-10-04 DIAGNOSIS — Z23 Encounter for immunization: Secondary | ICD-10-CM | POA: Diagnosis not present

## 2015-10-04 DIAGNOSIS — Z7982 Long term (current) use of aspirin: Secondary | ICD-10-CM

## 2015-10-04 DIAGNOSIS — E162 Hypoglycemia, unspecified: Secondary | ICD-10-CM | POA: Diagnosis present

## 2015-10-04 DIAGNOSIS — E875 Hyperkalemia: Secondary | ICD-10-CM | POA: Diagnosis not present

## 2015-10-04 DIAGNOSIS — R195 Other fecal abnormalities: Secondary | ICD-10-CM | POA: Diagnosis present

## 2015-10-04 DIAGNOSIS — D631 Anemia in chronic kidney disease: Secondary | ICD-10-CM | POA: Diagnosis not present

## 2015-10-04 DIAGNOSIS — R778 Other specified abnormalities of plasma proteins: Secondary | ICD-10-CM | POA: Diagnosis present

## 2015-10-04 DIAGNOSIS — N185 Chronic kidney disease, stage 5: Secondary | ICD-10-CM | POA: Diagnosis not present

## 2015-10-04 DIAGNOSIS — N2581 Secondary hyperparathyroidism of renal origin: Secondary | ICD-10-CM | POA: Diagnosis not present

## 2015-10-04 DIAGNOSIS — I953 Hypotension of hemodialysis: Secondary | ICD-10-CM | POA: Diagnosis not present

## 2015-10-04 DIAGNOSIS — R5381 Other malaise: Secondary | ICD-10-CM | POA: Diagnosis not present

## 2015-10-04 DIAGNOSIS — Z95828 Presence of other vascular implants and grafts: Secondary | ICD-10-CM

## 2015-10-04 DIAGNOSIS — Z6841 Body Mass Index (BMI) 40.0 and over, adult: Secondary | ICD-10-CM

## 2015-10-04 DIAGNOSIS — I360 Nonrheumatic tricuspid (valve) stenosis: Secondary | ICD-10-CM | POA: Diagnosis not present

## 2015-10-04 DIAGNOSIS — N189 Chronic kidney disease, unspecified: Secondary | ICD-10-CM | POA: Diagnosis not present

## 2015-10-04 DIAGNOSIS — I10 Essential (primary) hypertension: Secondary | ICD-10-CM | POA: Diagnosis present

## 2015-10-04 DIAGNOSIS — T82898A Other specified complication of vascular prosthetic devices, implants and grafts, initial encounter: Secondary | ICD-10-CM | POA: Diagnosis not present

## 2015-10-04 DIAGNOSIS — E7800 Pure hypercholesterolemia, unspecified: Secondary | ICD-10-CM | POA: Diagnosis not present

## 2015-10-04 DIAGNOSIS — N19 Unspecified kidney failure: Secondary | ICD-10-CM | POA: Diagnosis not present

## 2015-10-04 DIAGNOSIS — E1129 Type 2 diabetes mellitus with other diabetic kidney complication: Secondary | ICD-10-CM | POA: Diagnosis present

## 2015-10-04 DIAGNOSIS — I878 Other specified disorders of veins: Secondary | ICD-10-CM | POA: Diagnosis present

## 2015-10-04 DIAGNOSIS — K5909 Other constipation: Secondary | ICD-10-CM | POA: Diagnosis not present

## 2015-10-04 DIAGNOSIS — Z79899 Other long term (current) drug therapy: Secondary | ICD-10-CM

## 2015-10-04 DIAGNOSIS — R7302 Impaired glucose tolerance (oral): Secondary | ICD-10-CM | POA: Diagnosis present

## 2015-10-04 DIAGNOSIS — R05 Cough: Secondary | ICD-10-CM

## 2015-10-04 DIAGNOSIS — N186 End stage renal disease: Secondary | ICD-10-CM | POA: Diagnosis not present

## 2015-10-04 DIAGNOSIS — I248 Other forms of acute ischemic heart disease: Secondary | ICD-10-CM | POA: Diagnosis present

## 2015-10-04 MED ORDER — ALBUTEROL (5 MG/ML) CONTINUOUS INHALATION SOLN
10.0000 mg/h | INHALATION_SOLUTION | Freq: Once | RESPIRATORY_TRACT | Status: AC
  Administered 2015-10-04: 10 mg/h via RESPIRATORY_TRACT
  Filled 2015-10-04: qty 20

## 2015-10-04 MED ORDER — FUROSEMIDE 10 MG/ML IJ SOLN
80.0000 mg | Freq: Once | INTRAMUSCULAR | Status: AC
  Administered 2015-10-05: 80 mg via INTRAVENOUS
  Filled 2015-10-04: qty 8

## 2015-10-04 NOTE — ED Notes (Signed)
Pt complains of excess fluid in bilateral legs and being short of breath for about one week

## 2015-10-04 NOTE — Telephone Encounter (Signed)
New Message  Pt stated she is awaiting further directions from her Kidney MD- she was told to follow up at her 80 yesterday 5/3. Please call back and discuss.

## 2015-10-04 NOTE — Telephone Encounter (Signed)
The pt wants to know if Dr Irish Lack has spoke with her Kidney MD yet. She is advised that I will forward this note to Dr Irish Lack and that I will call her back with his response. She verbalized understanding.  Please advise.

## 2015-10-04 NOTE — Telephone Encounter (Signed)
OK to double Lasix to 80 mg BID.  I spoke with Dr. Florene Glen today.

## 2015-10-05 ENCOUNTER — Inpatient Hospital Stay (HOSPITAL_COMMUNITY): Payer: Medicare Other

## 2015-10-05 ENCOUNTER — Encounter (HOSPITAL_COMMUNITY): Payer: Self-pay | Admitting: Internal Medicine

## 2015-10-05 DIAGNOSIS — R7302 Impaired glucose tolerance (oral): Secondary | ICD-10-CM | POA: Diagnosis not present

## 2015-10-05 DIAGNOSIS — I5033 Acute on chronic diastolic (congestive) heart failure: Secondary | ICD-10-CM | POA: Diagnosis not present

## 2015-10-05 DIAGNOSIS — N19 Unspecified kidney failure: Secondary | ICD-10-CM | POA: Insufficient documentation

## 2015-10-05 DIAGNOSIS — M1990 Unspecified osteoarthritis, unspecified site: Secondary | ICD-10-CM | POA: Diagnosis not present

## 2015-10-05 DIAGNOSIS — K921 Melena: Secondary | ICD-10-CM | POA: Diagnosis not present

## 2015-10-05 DIAGNOSIS — I878 Other specified disorders of veins: Secondary | ICD-10-CM | POA: Diagnosis present

## 2015-10-05 DIAGNOSIS — E1129 Type 2 diabetes mellitus with other diabetic kidney complication: Secondary | ICD-10-CM | POA: Diagnosis present

## 2015-10-05 DIAGNOSIS — J9621 Acute and chronic respiratory failure with hypoxia: Secondary | ICD-10-CM | POA: Diagnosis not present

## 2015-10-05 DIAGNOSIS — K5909 Other constipation: Secondary | ICD-10-CM | POA: Diagnosis not present

## 2015-10-05 DIAGNOSIS — I517 Cardiomegaly: Secondary | ICD-10-CM | POA: Diagnosis not present

## 2015-10-05 DIAGNOSIS — G9341 Metabolic encephalopathy: Secondary | ICD-10-CM | POA: Diagnosis not present

## 2015-10-05 DIAGNOSIS — D649 Anemia, unspecified: Secondary | ICD-10-CM | POA: Diagnosis not present

## 2015-10-05 DIAGNOSIS — E1122 Type 2 diabetes mellitus with diabetic chronic kidney disease: Secondary | ICD-10-CM | POA: Diagnosis not present

## 2015-10-05 DIAGNOSIS — Z7982 Long term (current) use of aspirin: Secondary | ICD-10-CM | POA: Diagnosis not present

## 2015-10-05 DIAGNOSIS — K8020 Calculus of gallbladder without cholecystitis without obstruction: Secondary | ICD-10-CM | POA: Diagnosis not present

## 2015-10-05 DIAGNOSIS — K567 Ileus, unspecified: Secondary | ICD-10-CM | POA: Diagnosis not present

## 2015-10-05 DIAGNOSIS — R05 Cough: Secondary | ICD-10-CM | POA: Diagnosis not present

## 2015-10-05 DIAGNOSIS — E7800 Pure hypercholesterolemia, unspecified: Secondary | ICD-10-CM | POA: Diagnosis not present

## 2015-10-05 DIAGNOSIS — M109 Gout, unspecified: Secondary | ICD-10-CM | POA: Diagnosis present

## 2015-10-05 DIAGNOSIS — I248 Other forms of acute ischemic heart disease: Secondary | ICD-10-CM | POA: Diagnosis not present

## 2015-10-05 DIAGNOSIS — R5381 Other malaise: Secondary | ICD-10-CM | POA: Diagnosis not present

## 2015-10-05 DIAGNOSIS — D631 Anemia in chronic kidney disease: Secondary | ICD-10-CM | POA: Diagnosis present

## 2015-10-05 DIAGNOSIS — N179 Acute kidney failure, unspecified: Secondary | ICD-10-CM | POA: Diagnosis not present

## 2015-10-05 DIAGNOSIS — N184 Chronic kidney disease, stage 4 (severe): Secondary | ICD-10-CM | POA: Diagnosis not present

## 2015-10-05 DIAGNOSIS — J9811 Atelectasis: Secondary | ICD-10-CM | POA: Diagnosis not present

## 2015-10-05 DIAGNOSIS — I10 Essential (primary) hypertension: Secondary | ICD-10-CM | POA: Diagnosis present

## 2015-10-05 DIAGNOSIS — R0602 Shortness of breath: Secondary | ICD-10-CM | POA: Diagnosis not present

## 2015-10-05 DIAGNOSIS — N186 End stage renal disease: Secondary | ICD-10-CM | POA: Diagnosis not present

## 2015-10-05 DIAGNOSIS — E872 Acidosis: Secondary | ICD-10-CM | POA: Diagnosis not present

## 2015-10-05 DIAGNOSIS — E875 Hyperkalemia: Secondary | ICD-10-CM | POA: Insufficient documentation

## 2015-10-05 DIAGNOSIS — T82898A Other specified complication of vascular prosthetic devices, implants and grafts, initial encounter: Secondary | ICD-10-CM | POA: Diagnosis not present

## 2015-10-05 DIAGNOSIS — J9601 Acute respiratory failure with hypoxia: Secondary | ICD-10-CM | POA: Diagnosis not present

## 2015-10-05 DIAGNOSIS — I132 Hypertensive heart and chronic kidney disease with heart failure and with stage 5 chronic kidney disease, or end stage renal disease: Secondary | ICD-10-CM | POA: Diagnosis not present

## 2015-10-05 DIAGNOSIS — E662 Morbid (severe) obesity with alveolar hypoventilation: Secondary | ICD-10-CM | POA: Diagnosis not present

## 2015-10-05 DIAGNOSIS — R7989 Other specified abnormal findings of blood chemistry: Secondary | ICD-10-CM | POA: Diagnosis not present

## 2015-10-05 DIAGNOSIS — N189 Chronic kidney disease, unspecified: Secondary | ICD-10-CM

## 2015-10-05 DIAGNOSIS — J810 Acute pulmonary edema: Secondary | ICD-10-CM | POA: Diagnosis not present

## 2015-10-05 DIAGNOSIS — Z6841 Body Mass Index (BMI) 40.0 and over, adult: Secondary | ICD-10-CM | POA: Diagnosis not present

## 2015-10-05 DIAGNOSIS — N185 Chronic kidney disease, stage 5: Secondary | ICD-10-CM | POA: Diagnosis not present

## 2015-10-05 DIAGNOSIS — E870 Hyperosmolality and hypernatremia: Secondary | ICD-10-CM | POA: Diagnosis not present

## 2015-10-05 DIAGNOSIS — E119 Type 2 diabetes mellitus without complications: Secondary | ICD-10-CM | POA: Diagnosis not present

## 2015-10-05 DIAGNOSIS — I953 Hypotension of hemodialysis: Secondary | ICD-10-CM | POA: Diagnosis not present

## 2015-10-05 DIAGNOSIS — Z992 Dependence on renal dialysis: Secondary | ICD-10-CM | POA: Diagnosis not present

## 2015-10-05 DIAGNOSIS — E11649 Type 2 diabetes mellitus with hypoglycemia without coma: Secondary | ICD-10-CM | POA: Diagnosis not present

## 2015-10-05 DIAGNOSIS — E162 Hypoglycemia, unspecified: Secondary | ICD-10-CM | POA: Diagnosis not present

## 2015-10-05 DIAGNOSIS — N182 Chronic kidney disease, stage 2 (mild): Secondary | ICD-10-CM | POA: Diagnosis not present

## 2015-10-05 DIAGNOSIS — N2581 Secondary hyperparathyroidism of renal origin: Secondary | ICD-10-CM | POA: Diagnosis not present

## 2015-10-05 DIAGNOSIS — Z452 Encounter for adjustment and management of vascular access device: Secondary | ICD-10-CM | POA: Diagnosis not present

## 2015-10-05 DIAGNOSIS — Z79899 Other long term (current) drug therapy: Secondary | ICD-10-CM | POA: Diagnosis not present

## 2015-10-05 DIAGNOSIS — I360 Nonrheumatic tricuspid (valve) stenosis: Secondary | ICD-10-CM | POA: Diagnosis not present

## 2015-10-05 DIAGNOSIS — K5900 Constipation, unspecified: Secondary | ICD-10-CM | POA: Diagnosis not present

## 2015-10-05 DIAGNOSIS — Z23 Encounter for immunization: Secondary | ICD-10-CM | POA: Diagnosis not present

## 2015-10-05 DIAGNOSIS — M6281 Muscle weakness (generalized): Secondary | ICD-10-CM | POA: Diagnosis not present

## 2015-10-05 LAB — BASIC METABOLIC PANEL
Anion gap: 19 — ABNORMAL HIGH (ref 5–15)
BUN: 150 mg/dL — AB (ref 6–20)
CHLORIDE: 117 mmol/L — AB (ref 101–111)
CO2: 13 mmol/L — ABNORMAL LOW (ref 22–32)
Calcium: 8 mg/dL — ABNORMAL LOW (ref 8.9–10.3)
Creatinine, Ser: 18.22 mg/dL — ABNORMAL HIGH (ref 0.44–1.00)
GFR calc Af Amer: 2 mL/min — ABNORMAL LOW (ref >60)
GFR calc non Af Amer: 2 mL/min — ABNORMAL LOW (ref >60)
GLUCOSE: 106 mg/dL — AB (ref 65–99)
POTASSIUM: 7.1 mmol/L — AB (ref 3.5–5.1)
Sodium: 149 mmol/L — ABNORMAL HIGH (ref 135–145)

## 2015-10-05 LAB — CBC WITH DIFFERENTIAL/PLATELET
Basophils Absolute: 0 10*3/uL (ref 0.0–0.1)
Basophils Absolute: 0 10*3/uL (ref 0.0–0.1)
Basophils Relative: 0 %
Basophils Relative: 0 %
Eosinophils Absolute: 0.1 10*3/uL (ref 0.0–0.7)
Eosinophils Absolute: 0.1 10*3/uL (ref 0.0–0.7)
Eosinophils Relative: 1 %
Eosinophils Relative: 1 %
HEMATOCRIT: 24.1 % — AB (ref 36.0–46.0)
HEMATOCRIT: 23.9 % — AB (ref 36.0–46.0)
HEMOGLOBIN: 8 g/dL — AB (ref 12.0–15.0)
Hemoglobin: 7.7 g/dL — ABNORMAL LOW (ref 12.0–15.0)
LYMPHS ABS: 1.8 10*3/uL (ref 0.7–4.0)
LYMPHS PCT: 15 %
LYMPHS PCT: 9 %
Lymphs Abs: 0.9 10*3/uL (ref 0.7–4.0)
MCH: 26.8 pg (ref 26.0–34.0)
MCH: 26.3 pg (ref 26.0–34.0)
MCHC: 33.2 g/dL (ref 30.0–36.0)
MCHC: 32.2 g/dL (ref 30.0–36.0)
MCV: 80.9 fL (ref 78.0–100.0)
MCV: 81.6 fL (ref 78.0–100.0)
Monocytes Absolute: 1.3 10*3/uL — ABNORMAL HIGH (ref 0.1–1.0)
Monocytes Absolute: 1.3 10*3/uL — ABNORMAL HIGH (ref 0.1–1.0)
Monocytes Relative: 11 %
Monocytes Relative: 12 %
NEUTROS ABS: 8.6 10*3/uL — AB (ref 1.7–7.7)
NEUTROS PCT: 73 %
NEUTROS PCT: 78 %
Neutro Abs: 8.2 10*3/uL — ABNORMAL HIGH (ref 1.7–7.7)
Platelets: 247 10*3/uL (ref 150–400)
Platelets: 253 10*3/uL (ref 150–400)
RBC: 2.98 MIL/uL — AB (ref 3.87–5.11)
RBC: 2.93 MIL/uL — AB (ref 3.87–5.11)
RDW: 15.3 % (ref 11.5–15.5)
RDW: 15.2 % (ref 11.5–15.5)
WBC: 11.3 10*3/uL — AB (ref 4.0–10.5)
WBC: 11 10*3/uL — AB (ref 4.0–10.5)

## 2015-10-05 LAB — POCT I-STAT, CHEM 8
BUN: 120 mg/dL — AB (ref 6–20)
CHLORIDE: 113 mmol/L — AB (ref 101–111)
CREATININE: 15.2 mg/dL — AB (ref 0.44–1.00)
Calcium, Ion: 1.07 mmol/L — ABNORMAL LOW (ref 1.12–1.23)
Glucose, Bld: 88 mg/dL (ref 65–99)
HEMATOCRIT: 25 % — AB (ref 36.0–46.0)
Hemoglobin: 8.5 g/dL — ABNORMAL LOW (ref 12.0–15.0)
POTASSIUM: 4.6 mmol/L (ref 3.5–5.1)
Sodium: 147 mmol/L — ABNORMAL HIGH (ref 135–145)
TCO2: 20 mmol/L (ref 0–100)

## 2015-10-05 LAB — COMPREHENSIVE METABOLIC PANEL
ALT: 16 U/L (ref 14–54)
ANION GAP: 18 — AB (ref 5–15)
AST: 21 U/L (ref 15–41)
Albumin: 2 g/dL — ABNORMAL LOW (ref 3.5–5.0)
Alkaline Phosphatase: 84 U/L (ref 38–126)
BUN: 159 mg/dL — ABNORMAL HIGH (ref 6–20)
CHLORIDE: 117 mmol/L — AB (ref 101–111)
CO2: 14 mmol/L — ABNORMAL LOW (ref 22–32)
Calcium: 8.3 mg/dL — ABNORMAL LOW (ref 8.9–10.3)
Creatinine, Ser: 18.78 mg/dL — ABNORMAL HIGH (ref 0.44–1.00)
GFR, EST AFRICAN AMERICAN: 2 mL/min — AB (ref >60)
GFR, EST NON AFRICAN AMERICAN: 2 mL/min — AB (ref >60)
Glucose, Bld: 95 mg/dL (ref 65–99)
POTASSIUM: 6.4 mmol/L — AB (ref 3.5–5.1)
Sodium: 149 mmol/L — ABNORMAL HIGH (ref 135–145)
Total Bilirubin: 0.6 mg/dL (ref 0.3–1.2)
Total Protein: 6.3 g/dL — ABNORMAL LOW (ref 6.5–8.1)

## 2015-10-05 LAB — TROPONIN I
Troponin I: 0.03 ng/mL (ref <0.031)
Troponin I: 0.11 ng/mL — ABNORMAL HIGH (ref <0.031)

## 2015-10-05 LAB — ABO/RH: ABO/RH(D): B POS

## 2015-10-05 LAB — RENAL FUNCTION PANEL
ANION GAP: 17 — AB (ref 5–15)
Albumin: 1.8 g/dL — ABNORMAL LOW (ref 3.5–5.0)
BUN: 161 mg/dL — ABNORMAL HIGH (ref 6–20)
CALCIUM: 8.1 mg/dL — AB (ref 8.9–10.3)
CO2: 15 mmol/L — AB (ref 22–32)
Chloride: 117 mmol/L — ABNORMAL HIGH (ref 101–111)
Creatinine, Ser: 18.7 mg/dL — ABNORMAL HIGH (ref 0.44–1.00)
GFR, EST AFRICAN AMERICAN: 2 mL/min — AB (ref >60)
GFR, EST NON AFRICAN AMERICAN: 2 mL/min — AB (ref >60)
Glucose, Bld: 93 mg/dL (ref 65–99)
Phosphorus: 12.3 mg/dL — ABNORMAL HIGH (ref 2.5–4.6)
Potassium: 6.3 mmol/L (ref 3.5–5.1)
SODIUM: 149 mmol/L — AB (ref 135–145)

## 2015-10-05 LAB — BRAIN NATRIURETIC PEPTIDE: B Natriuretic Peptide: 464.4 pg/mL — ABNORMAL HIGH (ref 0.0–100.0)

## 2015-10-05 LAB — PHOSPHORUS: PHOSPHORUS: 12.3 mg/dL — AB (ref 2.5–4.6)

## 2015-10-05 LAB — MRSA PCR SCREENING: MRSA by PCR: NEGATIVE

## 2015-10-05 LAB — ALT: ALT: 14 U/L (ref 14–54)

## 2015-10-05 LAB — GLUCOSE, CAPILLARY
Glucose-Capillary: 82 mg/dL (ref 65–99)
Glucose-Capillary: 76 mg/dL (ref 65–99)

## 2015-10-05 LAB — CBG MONITORING, ED: GLUCOSE-CAPILLARY: 62 mg/dL — AB (ref 65–99)

## 2015-10-05 MED ORDER — HEPARIN SODIUM (PORCINE) 1000 UNIT/ML DIALYSIS
20.0000 [IU]/kg | INTRAMUSCULAR | Status: DC | PRN
  Administered 2015-10-05: 3800 [IU] via INTRAVENOUS_CENTRAL

## 2015-10-05 MED ORDER — PENTAFLUOROPROP-TETRAFLUOROETH EX AERO: 1.0000 "application " | INHALATION_SPRAY | CUTANEOUS | Status: DC | PRN

## 2015-10-05 MED ORDER — ATORVASTATIN CALCIUM 40 MG PO TABS
40.0000 mg | ORAL_TABLET | Freq: Every day | ORAL | Status: DC
Start: 2015-10-05 — End: 2015-11-06
  Administered 2015-10-05 – 2015-11-05 (×31): 40 mg via ORAL
  Filled 2015-10-05: qty 2
  Filled 2015-10-05 (×32): qty 1

## 2015-10-05 MED ORDER — SODIUM CHLORIDE 0.9 % IV SOLN: 100.0000 mL | INTRAVENOUS | Status: DC | PRN

## 2015-10-05 MED ORDER — HYDRALAZINE HCL 20 MG/ML IJ SOLN: 10.0000 mg | INTRAMUSCULAR | Status: DC | PRN

## 2015-10-05 MED ORDER — FUROSEMIDE 10 MG/ML IJ SOLN
80.0000 mg | Freq: Every day | INTRAMUSCULAR | Status: DC
  Administered 2015-10-05: 80 mg via INTRAVENOUS
  Filled 2015-10-05: qty 8

## 2015-10-05 MED ORDER — INSULIN ASPART 100 UNIT/ML ~~LOC~~ SOLN
10.0000 [IU] | Freq: Once | SUBCUTANEOUS | Status: DC
  Filled 2015-10-05: qty 1

## 2015-10-05 MED ORDER — ONDANSETRON HCL 4 MG/2ML IJ SOLN
4.0000 mg | Freq: Four times a day (QID) | INTRAMUSCULAR | Status: DC | PRN
  Administered 2015-10-20 – 2015-11-05 (×2): 4 mg via INTRAVENOUS
  Filled 2015-10-05 (×3): qty 2

## 2015-10-05 MED ORDER — ALTEPLASE 2 MG IJ SOLR: 2.0000 mg | Freq: Once | INTRAMUSCULAR | Status: DC | PRN

## 2015-10-05 MED ORDER — OXYCODONE HCL 5 MG PO TABS: 5.0000 mg | ORAL_TABLET | ORAL | Status: DC | PRN

## 2015-10-05 MED ORDER — HEPARIN SODIUM (PORCINE) 1000 UNIT/ML DIALYSIS: 1000.0000 [IU] | INTRAMUSCULAR | Status: DC | PRN

## 2015-10-05 MED ORDER — SEVELAMER CARBONATE 800 MG PO TABS
2400.0000 mg | ORAL_TABLET | Freq: Three times a day (TID) | ORAL | Status: DC
  Administered 2015-10-06 – 2015-10-08 (×6): 2400 mg via ORAL
  Filled 2015-10-05 (×7): qty 3

## 2015-10-05 MED ORDER — INSULIN ASPART 100 UNIT/ML ~~LOC~~ SOLN: 0.0000 [IU] | Freq: Three times a day (TID) | SUBCUTANEOUS | Status: DC

## 2015-10-05 MED ORDER — ADULT MULTIVITAMIN W/MINERALS CH
1.0000 | ORAL_TABLET | Freq: Every day | ORAL | Status: DC
  Administered 2015-10-05 – 2015-11-06 (×32): 1 via ORAL
  Filled 2015-10-05 (×32): qty 1

## 2015-10-05 MED ORDER — ASPIRIN EC 81 MG PO TBEC
81.0000 mg | DELAYED_RELEASE_TABLET | Freq: Every day | ORAL | Status: DC
  Administered 2015-10-05 – 2015-10-29 (×25): 81 mg via ORAL
  Filled 2015-10-05 (×25): qty 1

## 2015-10-05 MED ORDER — CALCIUM GLUCONATE 10 % IV SOLN
1.0000 g | Freq: Once | INTRAVENOUS | Status: AC
  Administered 2015-10-05: 1 g via INTRAVENOUS
  Filled 2015-10-05: qty 10

## 2015-10-05 MED ORDER — DEXTROSE 50 % IV SOLN
1.0000 | Freq: Once | INTRAVENOUS | Status: AC
  Administered 2015-10-05: 50 mL via INTRAVENOUS
  Filled 2015-10-05: qty 50

## 2015-10-05 MED ORDER — CLONIDINE HCL 0.1 MG PO TABS
0.2000 mg | ORAL_TABLET | Freq: Two times a day (BID) | ORAL | Status: DC
  Administered 2015-10-06 – 2015-10-07 (×3): 0.2 mg via ORAL
  Filled 2015-10-05 (×4): qty 2

## 2015-10-05 MED ORDER — SODIUM POLYSTYRENE SULFONATE 15 GM/60ML PO SUSP
45.0000 g | Freq: Once | ORAL | Status: AC
  Administered 2015-10-05: 45 g via ORAL
  Filled 2015-10-05: qty 180

## 2015-10-05 MED ORDER — ACETAMINOPHEN 325 MG PO TABS
650.0000 mg | ORAL_TABLET | Freq: Four times a day (QID) | ORAL | Status: DC | PRN
  Administered 2015-10-05 – 2015-11-06 (×21): 650 mg via ORAL
  Filled 2015-10-05 (×21): qty 2

## 2015-10-05 MED ORDER — CLONIDINE HCL 0.3 MG PO TABS
0.3000 mg | ORAL_TABLET | Freq: Two times a day (BID) | ORAL | Status: DC
  Administered 2015-10-05: 0.3 mg via ORAL
  Filled 2015-10-05 (×2): qty 1

## 2015-10-05 MED ORDER — INSULIN ASPART 100 UNIT/ML ~~LOC~~ SOLN
10.0000 [IU] | Freq: Once | SUBCUTANEOUS | Status: AC
  Administered 2015-10-05: 10 [IU] via INTRAVENOUS

## 2015-10-05 MED ORDER — LIDOCAINE HCL (PF) 1 % IJ SOLN: 5.0000 mL | INTRAMUSCULAR | Status: DC | PRN

## 2015-10-05 MED ORDER — AMLODIPINE BESYLATE 10 MG PO TABS
10.0000 mg | ORAL_TABLET | Freq: Every day | ORAL | Status: DC
  Administered 2015-10-05 – 2015-10-25 (×21): 10 mg via ORAL
  Filled 2015-10-05 (×21): qty 1

## 2015-10-05 MED ORDER — HEPARIN SODIUM (PORCINE) 5000 UNIT/ML IJ SOLN
5000.0000 [IU] | Freq: Three times a day (TID) | INTRAMUSCULAR | Status: DC
  Administered 2015-10-05 – 2015-10-06 (×3): 5000 [IU] via SUBCUTANEOUS
  Filled 2015-10-05 (×4): qty 1

## 2015-10-05 MED ORDER — METOPROLOL SUCCINATE ER 100 MG PO TB24
100.0000 mg | ORAL_TABLET | Freq: Two times a day (BID) | ORAL | Status: DC
  Administered 2015-10-05: 100 mg via ORAL
  Filled 2015-10-05 (×2): qty 1

## 2015-10-05 MED ORDER — METOPROLOL SUCCINATE ER 50 MG PO TB24
50.0000 mg | ORAL_TABLET | Freq: Two times a day (BID) | ORAL | Status: DC
  Administered 2015-10-06 – 2015-10-28 (×41): 50 mg via ORAL
  Filled 2015-10-05: qty 1
  Filled 2015-10-05: qty 2
  Filled 2015-10-05 (×3): qty 1
  Filled 2015-10-05: qty 2
  Filled 2015-10-05 (×12): qty 1
  Filled 2015-10-05: qty 2
  Filled 2015-10-05 (×22): qty 1

## 2015-10-05 MED ORDER — ONDANSETRON HCL 4 MG PO TABS
4.0000 mg | ORAL_TABLET | Freq: Four times a day (QID) | ORAL | Status: DC | PRN
  Administered 2015-10-07 – 2015-10-11 (×2): 4 mg via ORAL
  Filled 2015-10-05 (×2): qty 1

## 2015-10-05 MED ORDER — LIDOCAINE-PRILOCAINE 2.5-2.5 % EX CREA: 1.0000 "application " | TOPICAL_CREAM | CUTANEOUS | Status: DC | PRN

## 2015-10-05 MED ORDER — SODIUM CHLORIDE 0.9% FLUSH
3.0000 mL | Freq: Two times a day (BID) | INTRAVENOUS | Status: DC
  Administered 2015-10-05 – 2015-11-06 (×57): 3 mL via INTRAVENOUS

## 2015-10-05 MED ORDER — FEBUXOSTAT 40 MG PO TABS
40.0000 mg | ORAL_TABLET | Freq: Every day | ORAL | Status: DC
  Administered 2015-10-05 – 2015-11-06 (×32): 40 mg via ORAL
  Filled 2015-10-05 (×33): qty 1

## 2015-10-05 MED ORDER — ACETAMINOPHEN 650 MG RE SUPP: 650.0000 mg | Freq: Four times a day (QID) | RECTAL | Status: DC | PRN

## 2015-10-05 NOTE — ED Notes (Signed)
Report given to brandy mc cone - report given to care link

## 2015-10-05 NOTE — Procedures (Signed)
Initial attempt at HD complicated by displacement of AVF needles. A temp cath was inserted by CCM and HD with 4000cc goal.  I observed pt & supervised treatment. She was hemodynamically stable and clinical improved with less SOB. Pasha Gadison C

## 2015-10-05 NOTE — Progress Notes (Addendum)
PROGRESS NOTE  [name]  [id number] DOB: Jan 21, 1956 DOA: 10/04/2015 PCP: Dwan Bolt, MD Outpatient Specialists:  Dr. Florene Glen, Nephrology  Brief Narrative:   [name] is a 60 y.o. 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 Patient has been having gradually worsening shortness of breath over the last 10 days. Denies any chest pain or productive cough fever chills. Patient states she has gained a lot of weight with peripheral edema. In the ER chest x-ray shows congestion and labs reviewed markedly elevated creatinine and BUN with no old labs to compare. Potassium is also elevated at 7.1 with no EKG changes. Patient also has metabolic acidosis. Patient was given calcium gluconate Lasix insulin and dextrose and Kayexalate.  The patient was transferred to [facility] for hemodialysis.  Assessment & Plan:   Principal Problem:   Acute pulmonary edema (HCC) Active Problems:   Morbid obesity ([place])   Renal failure (ARF), acute on chronic (HCC)   Chronic anemia   Type 2 diabetes mellitus with renal complication (HCC)   Hypertension   Acute pulmonary edema, secondary to acute renal failure -  Continue Lasix 80 mg IV twice a day pending hemodialysis initiation  Hyperkalemia secondary to acute renal failure -  Potassium trending down after administration of Lasix, Kayexalate, dextrose and insulin -  No EKG changes -Continue telemetry  Hypernatremia, metabolic acidosis and hyperphosphatemia all secondary to acute on chronic renal failure  -  Anticipate initiation of hemodialysis  -  Check PTH   Acute on chronic kidney disease, acute renal failure  -  Left arm fistula was placed 07/25/2015 and is likely not mature enough to for use for hemodialysis  -  Continue stepdown until she has more secure hemodialysis access placed  -   Nephrology consultation for urgent hemodialysis -  We will notify case management and social work at this patient will likely need outpatient hemodialysis placement.  Suspect this is now end-stage renal disease  -  Renally dose medications and minimize nephrotoxins      Diabetes mellitus type 2  -  Check a1c -  Continue low dose SSI  Essential hypertension, blood pressures elevated.  Anticipate that her blood pressures may trending down with hemodialysis -  Continue clonidine, amlodipine 10 mg, metoprolol 100 mg by mouth twice a day -  Add hydralazine when necessary  Gout, stable, continue uloric   DVT prophylaxis:  heparin Code Status:  Full code Family Communication:  Patient alone Disposition Plan:  Anticipate that she can transfer out of stepdown after first HD session to bed on 6E.  Will likely continue HD through thte weekend and probably discharge with outpatient HD sometime next week.  CM and SW to assist.     Consultants:   Nephrology  Procedures:  Temporary HD catheter placed by PCCM on 10/05/2015  Antimicrobials:   none   Subjective: Patient states that she feels much better. Her breathing is much easier. She has made some urine after receiving Lasix. She has had multiple bowel movements which she was told to expect the Kayexalate.  Objective: Filed Vitals:   10/05/15 1300 10/05/15 1330 10/05/15 1400 10/05/15 1430  BP: 166/80 120/62 133/76   Pulse: 108 108 111 108  Temp:      TempSrc:      Resp:      Height:      Weight:  SpO2:       No intake or output data in the 24 hours ending 10/05/15 1637 Filed Weights   10/05/15 0700 10/05/15 0714 10/05/15 1231  Weight: 188.243 kg (415 lb) 188.243 kg (415 lb) 186.882 kg (412 lb)    Examination:  General exam:  Adult Female.  No acute distress.  HEENT:  NCAT, MMM,  Respiratory system: Clear to auscultation. Respiratory effort normal. Cardiovascular system: S1 & S2 heard, RRR. No JVD, murmurs, rubs, gallops  or clicks.  Warm extremities Gastrointestinal system: Abdomen is nondistended, soft and nontender. No organomegaly or masses felt. Normal bowel sounds heard. Skin: No rashes, lesions or ulcers. Skin feels edematous throughout MSK:  Normal tone and bulk, + lower extremity edema, obese.  Left arm fistula feels small but has good bruit and thrill. Neuro:  Grossly intact Psychiatry: Judgement and insight appear normal. Mood & affect appropriate.     Data Reviewed: I have personally reviewed following labs and imaging studies  CBC:  Recent Labs Lab 10/05/15 0100 10/05/15 0819 10/05/15 1430  WBC 11.3* 11.0*  --   NEUTROABS 8.2* 8.6*  --   HGB 8.0* 7.7* 8.5*  HCT 24.1* 23.9* 25.0*  MCV 80.9 81.6  --   PLT 247 253  --    Basic Metabolic Panel:  Recent Labs Lab 10/05/15 0135 10/05/15 0819 10/05/15 1030 10/05/15 1430  NA 149* 149* 149* 147*  K 7.1* 6.4* 6.3* 4.6  CL 117* 117* 117* 113*  CO2 13* 14* 15*  --   GLUCOSE 106* 95 93 88  BUN 150* 159* 161* 120*  CREATININE 18.22* 18.78* 18.70* 15.20*  CALCIUM 8.0* 8.3* 8.1*  --   PHOS  --  12.3* 12.3*  --    GFR: Estimated Creatinine Clearance: 7 mL/min (by C-G formula based on Cr of 15.2). Liver Function Tests:  Recent Labs Lab 10/05/15 0819 10/05/15 1030  AST 21  --   ALT 16 14  ALKPHOS 84  --   BILITOT 0.6  --   PROT 6.3*  --   ALBUMIN 2.0* 1.8*   No results for input(s): LIPASE, AMYLASE in the last 168 hours. No results for input(s): AMMONIA in the last 168 hours. Coagulation Profile: No results for input(s): INR, PROTIME in the last 168 hours. Cardiac Enzymes:  Recent Labs Lab 10/05/15 0135 10/05/15 0819  TROPONINI 0.03 0.11*   BNP (last 3 results) No results for input(s): PROBNP in the last 8760 hours. HbA1C: No results for input(s): HGBA1C in the last 72 hours. CBG:  Recent Labs Lab 10/05/15 0618 10/05/15 0705  GLUCAP 62* 82   Lipid Profile: No results for input(s): CHOL, HDL, LDLCALC, TRIG,  CHOLHDL, LDLDIRECT in the last 72 hours. Thyroid Function Tests: No results for input(s): TSH, T4TOTAL, FREET4, T3FREE, THYROIDAB in the last 72 hours. Anemia Panel: No results for input(s): VITAMINB12, FOLATE, FERRITIN, TIBC, IRON, RETICCTPCT in the last 72 hours. Urine analysis: No results found for: COLORURINE, APPEARANCEUR, LABSPEC, PHURINE, GLUCOSEU, HGBUR, BILIRUBINUR, KETONESUR, PROTEINUR, UROBILINOGEN, NITRITE, LEUKOCYTESUR Sepsis Labs: @LABRCNTIP (procalcitonin:4,lacticidven:4)  ) Recent Results (from the past 240 hour(s))  MRSA PCR Screening     Status: None   Collection Time: 10/05/15  7:02 AM  Result Value Ref Range Status   MRSA by PCR NEGATIVE NEGATIVE Final    Comment:        The GeneXpert MRSA Assay (FDA approved for NASAL specimens only), is one component of a comprehensive MRSA colonization surveillance program. It is not intended to diagnose MRSA  infection nor to guide or monitor treatment for MRSA infections.       Radiology Studies: Dg Chest Port 1 View  10/05/2015  CLINICAL DATA:  Central line placement. EXAM: PORTABLE CHEST 1 VIEW COMPARISON:  10/04/2015 FINDINGS: Right central line placement with the tip in the SVC. No pneumothorax. Heart is mildly enlarged. Mild vascular congestion. Mild interstitial prominence similar prior study. No acute bony abnormality. IMPRESSION: Right central line tip in the SVC. No pneumothorax. Otherwise no real change. Electronically Signed   By: Rolm Baptise M.D.   On: 10/05/2015 12:17   Dg Chest Port 1 View  10/04/2015  CLINICAL DATA:  Chest pain, shortness of breath, and cough for 1 week, worse tonight. EXAM: PORTABLE CHEST 1 VIEW COMPARISON:  09/28/2015 FINDINGS: Shallow inspiration. Cardiac enlargement with pulmonary vascular congestion. Mild interstitial pattern suggesting early interstitial edema. Similar appearance to previous study. No blunting of costophrenic angles. No pneumothorax. Mediastinal contours appear intact.  IMPRESSION: Cardiac enlargement with pulmonary vascular congestion and mild interstitial edema similar to prior study. Electronically Signed   By: Lucienne Capers M.D.   On: 10/04/2015 23:29     Scheduled Meds: . amLODipine  10 mg Oral QHS  . aspirin EC  81 mg Oral Daily  . atorvastatin  40 mg Oral Daily  . cloNIDine  0.3 mg Oral BID  . febuxostat  40 mg Oral Daily  . furosemide  80 mg Intravenous Daily  . heparin  5,000 Units Subcutaneous Q8H  . insulin aspart  0-9 Units Subcutaneous TID WC  . metoprolol succinate  100 mg Oral BID  . multivitamin with minerals  1 tablet Oral Daily  . sodium chloride flush  3 mL Intravenous Q12H   Continuous Infusions:    LOS: 0 days    Time spent: 30 min    Janece Canterbury, MD [facility] Pager [phone]  If 7PM-7AM, please contact night-coverage [email or web address] Password [facility] 10/05/2015, 4:37 PM

## 2015-10-05 NOTE — H&P (Signed)
History and Physical    [name] [id number] DOB: [birth year] DOA: 10/04/2015  Referring MD/NP/PA: Dr. Rex Kras. PCP: Dwan Bolt, MD  Outpatient Specialists: Dr. Florene Glen. Nephrologist. Patient coming from: Home.  Chief Complaint: Shortness of breath.  HPI: [name] is a 60 y.o. 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 Patient has been having gradually worsening shortness of breath over the last 10 days. Denies any chest pain or productive cough fever chills. Patient states she has gained a lot of weight with peripheral edema. In the ER chest x-ray shows congestion and labs reviewed markedly elevated creatinine with no old labs to compare. Potassium is also elevated at 7.1 with no EKG changes. Patient also has metabolic acidosis. Patient was given calcium gluconate Lasix insulin and dextrose and I have also added Kayexalate.Marland Kitchen On-call nephrologist Dr. Justin Mend has been consulted and patient will be transferred to [facility] for possible dialysis. Patient states he does make urine.  ED Course: Patient was given Kayexalate, calcium gluconate, insulin and dextrose and Lasix in the ER.  Review of Systems: As per HPI otherwise 10 point review of systems negative.    Past Medical History  Diagnosis Date  . Hypertension   . Kidney disease   . High cholesterol   . Shortness of breath dyspnea     with exertion  . H/O blood clots     many years ago in leg  . Sleep apnea     uses c-pap most of time  . Pneumonia   . Diabetes mellitus without complication ([place])     type 2  . Seizures ([place])     had one or two (more than 10-15 years ago)  . Arthritis   . Constipation     Past Surgical History  Procedure Laterality Date  . Abdominal hysterectomy    . Colonoscopy    . Av fistula placement Left 07/25/2015    Procedure: ARTERIOVENOUS  (AV) FISTULA CREATION -LEFT BRACHIOCEPHALIC;  Surgeon: Conrad Tierra Amarilla, MD;  Location: [facility];  Service: Vascular;  Laterality: Left;     reports that she has never smoked. She has never used smokeless tobacco. She reports that she does not drink alcohol or use illicit drugs.  No Known Allergies  Family History  Problem Relation Age of Onset  . Cancer Mother   . Hypertension Father   . Heart attack Sister   . Heart attack Paternal Aunt   . Stroke Neg Hx     Prior to Admission medications   Medication Sig Start Date End Date Taking? Authorizing Provider  amLODipine (NORVASC) 10 MG tablet Take 10 mg by mouth at bedtime.    Yes Historical Provider, MD  aspirin 81 MG tablet Take 81 mg by mouth daily.   Yes Historical Provider, MD  atorvastatin (LIPITOR) 40 MG tablet Take 40 mg by mouth daily.   Yes Historical Provider, MD  Cholecalciferol (VITAMIN D PO) Take 1 tablet by mouth daily.   Yes Historical Provider, MD  cloNIDine (CATAPRES) 0.3 MG tablet Take 0.3 mg by mouth 2 (two) times daily.   Yes Historical Provider, MD  febuxostat (ULORIC) 40 MG tablet Take 40 mg by mouth daily.   Yes Historical Provider, MD  furosemide (LASIX) 80 MG tablet Take 80 mg by mouth daily.   Yes Historical Provider, MD  Liraglutide (VICTOZA) 18 MG/3ML SOPN Inject 1.8 mg  into the skin daily.    Yes Historical Provider, MD  metoprolol succinate (TOPROL-XL) 100 MG 24 hr tablet Take 100 mg by mouth 2 (two) times daily. Take with or immediately following a meal.   Yes Historical Provider, MD  Multiple Vitamins-Minerals (MULTIVITAMINS THER. W/MINERALS) TABS tablet Take 1 tablet by mouth daily.   Yes Historical Provider, MD  sacubitril-valsartan (ENTRESTO) 24-26 MG Take 1 tablet by mouth 2 (two) times daily.   Yes Historical Provider, MD    Physical Exam: Filed Vitals:   10/05/15 0330 10/05/15 0400 10/05/15 0430 10/05/15 0500  BP: 155/74 141/73 127/73 129/67  Pulse: 99 108 106 104  Temp:      TempSrc:      Resp: 13  19 16 19   Height:      Weight:      SpO2: 98% 98% 100% 97%      Constitutional: Appears mildly short of breath. Filed Vitals:   10/05/15 0330 10/05/15 0400 10/05/15 0430 10/05/15 0500  BP: 155/74 141/73 127/73 129/67  Pulse: 99 108 106 104  Temp:      TempSrc:      Resp: 13 19 16 19   Height:      Weight:      SpO2: 98% 98% 100% 97%   Eyes: Anicteric mild pallor. ENMT: No discharge from the ears eyes nose or mouth. Neck: No JVD appreciated. No mass felt. Respiratory: Bilateral air in the present. Cardiovascular: S1-S2 heard. Abdomen: Soft nontender bowel sounds present. Musculoskeletal: Chronic skin changes in the lower extremity with edema. Skin: Chronic skin changes. Neurologic: Alert awake oriented to time place and person. Moves all extremities. Psychiatric: Appears normal.   Labs on Admission: I have personally reviewed following labs and imaging studies  CBC:  Recent Labs Lab 10/05/15 0100  WBC 11.3*  NEUTROABS 8.2*  HGB 8.0*  HCT 24.1*  MCV 80.9  PLT [id number]   Basic Metabolic Panel:  Recent Labs Lab 10/05/15 0135  NA 149*  K 7.1*  CL 117*  CO2 13*  GLUCOSE 106*  BUN 150*  CREATININE 18.22*  CALCIUM 8.0*   GFR: Estimated Creatinine Clearance: 5.9 mL/min (by C-G formula based on Cr of 18.22). Liver Function Tests: No results for input(s): AST, ALT, ALKPHOS, BILITOT, PROT, ALBUMIN in the last 168 hours. No results for input(s): LIPASE, AMYLASE in the last 168 hours. No results for input(s): AMMONIA in the last 168 hours. Coagulation Profile: No results for input(s): INR, PROTIME in the last 168 hours. Cardiac Enzymes:  Recent Labs Lab 10/05/15 0135  TROPONINI 0.03   BNP (last 3 results) No results for input(s): PROBNP in the last 8760 hours. HbA1C: No results for input(s): HGBA1C in the last 72 hours. CBG: No results for input(s): GLUCAP in the last 168 hours. Lipid Profile: No results for input(s): CHOL, HDL, LDLCALC, TRIG, CHOLHDL,  LDLDIRECT in the last 72 hours. Thyroid Function Tests: No results for input(s): TSH, T4TOTAL, FREET4, T3FREE, THYROIDAB in the last 72 hours. Anemia Panel: No results for input(s): VITAMINB12, FOLATE, FERRITIN, TIBC, IRON, RETICCTPCT in the last 72 hours. Urine analysis: No results found for: COLORURINE, APPEARANCEUR, LABSPEC, PHURINE, GLUCOSEU, HGBUR, BILIRUBINUR, KETONESUR, PROTEINUR, UROBILINOGEN, NITRITE, LEUKOCYTESUR Sepsis Labs: @LABRCNTIP (procalcitonin:4,lacticidven:4) )No results found for this or any previous visit (from the past 240 hour(s)).   Radiological Exams on Admission: Dg Chest Port 1 View  10/04/2015  CLINICAL DATA:  Chest pain, shortness of breath, and cough for 1 week, worse tonight. EXAM: PORTABLE CHEST 1 VIEW COMPARISON:  09/28/2015 FINDINGS: Shallow inspiration. Cardiac enlargement with pulmonary vascular congestion. Mild interstitial pattern suggesting early interstitial edema. Similar appearance to previous study. No blunting of costophrenic angles. No pneumothorax. Mediastinal contours appear intact. IMPRESSION: Cardiac enlargement with pulmonary vascular congestion and mild interstitial edema similar to prior study. Electronically Signed   By: Lucienne Capers M.D.   On: 10/04/2015 23:29    EKG: Independently reviewed. Sinus tachycardia.  Assessment/Plan Principal Problem:   Acute pulmonary edema (HCC) Active Problems:   Morbid obesity ([place])   Renal failure (ARF), acute on chronic (HCC)   Chronic anemia   Type 2 diabetes mellitus with renal complication ([place])   Hypertension    #1. Acute pulmonary edema secondary to worsening kidney function with fluid overload - Dr. Justin Mend on-call nephrologist has been consulted by the ER physician and patient will be transferred to [place] is received Lasix 80 mg IV in the ER and will be placed on Lasix 80 mg IV daily and patient may probably need dialysis given the shortness of breath fluid overload and  hyperkalemia with worsening renal function. Patient is agreeable to transfer and Dr. Alcario Drought will be the accepting physician. #2. Acute on chronic renal failure with hyperkalemia - see #1. At this time patient has received Kayexalate 45 g, calcium gluconate, IV insulin and dextrose and Lasix. Recheck metabolic panel. Patient probably will need dialysis. Nephrologist aware. Follow intake output metabolic panel. Discontinue Enteresto. #3. Diabetes mellitus type 2 - will keep patient on sliding scale coverage for now. Closely follow CBGs. #4. Anemia probably chronic from kidney disease - hemoglobin in February was around 10 present is at 8. Closely follow CBC. #5. Hypertension - will continue clonidine, amlodipine and metoprolol but will hold off Enteresto due to renal failure and hyperkalemia. When necessary IV hydralazine for systolic blood pressure more than 160. #6. Hyperlipidemia on statins.   DVT prophylaxis: Heparin. Code Status: Full code.  Family Communication: Patient's sister.  Disposition Plan: Home.  Consults called: Dr. Justin Mend nephrologist by ER physician.  Admission status: Inpatient. Stepdown. Likely stay 3-4 days.    Rise Patience MD [facility] Pager [phone].  If 7PM-7AM, please contact night-coverage [email or web address] Password [email or web address]  10/05/2015, 5:21 AM

## 2015-10-05 NOTE — ED Notes (Signed)
CBG 62 before departure to [facility].

## 2015-10-05 NOTE — ED Notes (Signed)
Patient states she has not yet started dialysis.

## 2015-10-05 NOTE — ED Notes (Signed)
Dr. Kakrakandy at bedside. 

## 2015-10-05 NOTE — Care Management Note (Signed)
Case Management Note  Patient Details  Name: SOLA FARQUHAR MRN: [id number] Date of Birth: [birth year]  Subjective/Objective:      Pt admitted with Acute Pulmonary Edema - HD catheter placed - initial HD 10/05/15              Action/Plan:  Pt is from home with adult sister and is reported as independent by sister [place].  CM will continue to monitor for disposition needs   Expected Discharge Date:                  Expected Discharge Plan:  [place]  In-House Referral:     Discharge planning Services  CM Consult  Post Acute Care Choice:    Choice offered to:     DME Arranged:    DME Agency:     HH Arranged:    HH Agency:     Status of Service:  In process, will continue to follow  Medicare Important Message Given:    Date Medicare IM Given:    Medicare IM give by:    Date Additional Medicare IM Given:    Additional Medicare Important Message give by:     If discussed at [place] of Stay Meetings, dates discussed:    Additional Comments:  Maryclare Labrador, RN 10/05/2015, 3:43 PM

## 2015-10-05 NOTE — Consult Note (Signed)
[facility] Renal Consultation Note  Requesting MD: Hal Hope  Indication for Consultation: Hyperkalemia  HPI:   Patient is a 60 year old female presenting with 10 days of progressive dyspnea. PMH is significant for CKD, HTN, T2DM, and seizure disorder.  Patient initially presented to [facility] ED with 10 days of worsening shortness of breath, weight gain, and lower extremity swelling. In the ED, she was found to have a potassium of 7.1 with no EKG changes and a metabolic acidosis. She was given lasix, insulin, and kayexelate.   Currently patient feels a little better. No history of fevers or chills. Has not made much urine over the past several days.   CREATININE, SER  Date/Time Value Ref Range Status  10/05/2015 10:30 AM 18.70* 0.44 - 1.00 mg/dL Final  10/05/2015 08:19 AM 18.78* 0.44 - 1.00 mg/dL Final  10/05/2015 01:35 AM 18.22* 0.44 - 1.00 mg/dL Final   PMHx:   Past Medical History  Diagnosis Date  . Hypertension   . Kidney disease   . High cholesterol   . Shortness of breath dyspnea     with exertion  . H/O blood clots     many years ago in leg  . Sleep apnea     uses c-pap most of time  . Pneumonia   . Diabetes mellitus without complication ([place])     type 2  . Seizures ([place])     had one or two (more than 10-15 years ago)  . Arthritis   . Constipation     Past Surgical History  Procedure Laterality Date  . Abdominal hysterectomy    . Colonoscopy    . Av fistula placement Left 07/25/2015    Procedure: ARTERIOVENOUS (AV) FISTULA CREATION -LEFT BRACHIOCEPHALIC;  Surgeon: Conrad Frankfort, MD;  Location: [facility] OR;  Service: Vascular;  Laterality: Left;    Family Hx:  Family History  Problem Relation Age of Onset  . Cancer Mother   . Hypertension Father   . Heart attack Sister   . Heart attack Paternal Aunt   . Stroke Neg Hx     Social History:  reports that she has never smoked. She has never used smokeless tobacco. She reports that she does not  drink alcohol or use illicit drugs.  Allergies: No Known Allergies  Medications: Prior to Admission medications   Medication Sig Start Date End Date Taking? Authorizing Provider  amLODipine (NORVASC) 10 MG tablet Take 10 mg by mouth at bedtime.    Yes Historical Provider, MD  aspirin 81 MG tablet Take 81 mg by mouth daily.   Yes Historical Provider, MD  atorvastatin (LIPITOR) 40 MG tablet Take 40 mg by mouth daily.   Yes Historical Provider, MD  Cholecalciferol (VITAMIN D PO) Take 1 tablet by mouth daily.   Yes Historical Provider, MD  cloNIDine (CATAPRES) 0.3 MG tablet Take 0.3 mg by mouth 2 (two) times daily.   Yes Historical Provider, MD  febuxostat (ULORIC) 40 MG tablet Take 40 mg by mouth daily.   Yes Historical Provider, MD  furosemide (LASIX) 80 MG tablet Take 80 mg by mouth daily.   Yes Historical Provider, MD  Liraglutide (VICTOZA) 18 MG/3ML SOPN Inject 1.8 mg into the skin daily.    Yes Historical Provider, MD  metoprolol succinate (TOPROL-XL) 100 MG 24 hr tablet Take 100 mg by mouth 2 (two) times daily. Take with or immediately following a meal.   Yes Historical Provider, MD  Multiple Vitamins-Minerals (MULTIVITAMINS THER. W/MINERALS) TABS tablet  Take 1 tablet by mouth daily.   Yes Historical Provider, MD  sacubitril-valsartan (ENTRESTO) 24-26 MG Take 1 tablet by mouth 2 (two) times daily.   Yes Historical Provider, MD    I have reviewed the patient's current medications.  Labs:  Results for orders placed or performed during the hospital encounter of 10/04/15 (from the past 48 hour(s))  Brain natriuretic peptide     Status: Abnormal   Collection Time: 10/04/15 11:19 PM  Result Value Ref Range   B Natriuretic Peptide 464.4 (H) 0.0 - 100.0 pg/mL  CBC with Differential/Platelet     Status: Abnormal   Collection Time: 10/05/15  1:00 AM  Result Value Ref Range   WBC 11.3 (H) 4.0 - 10.5 K/uL   RBC 2.98 (L) 3.87 - 5.11 MIL/uL   Hemoglobin 8.0 (L) 12.0 - 15.0 g/dL   HCT 24.1 (L)  36.0 - 46.0 %   MCV 80.9 78.0 - 100.0 fL   MCH 26.8 26.0 - 34.0 pg   MCHC 33.2 30.0 - 36.0 g/dL   RDW 15.3 11.5 - 15.5 %   Platelets 247 150 - 400 K/uL   Neutrophils Relative % 73 %   Neutro Abs 8.2 (H) 1.7 - 7.7 K/uL   Lymphocytes Relative 15 %   Lymphs Abs 1.8 0.7 - 4.0 K/uL   Monocytes Relative 11 %   Monocytes Absolute 1.3 (H) 0.1 - 1.0 K/uL   Eosinophils Relative 1 %   Eosinophils Absolute 0.1 0.0 - 0.7 K/uL   Basophils Relative 0 %   Basophils Absolute 0.0 0.0 - 0.1 K/uL  Basic metabolic panel     Status: Abnormal   Collection Time: 10/05/15  1:35 AM  Result Value Ref Range   Sodium 149 (H) 135 - 145 mmol/L   Potassium 7.1 (HH) 3.5 - 5.1 mmol/L    Comment: CRITICAL RESULT CALLED TO, READ BACK BY AND VERIFIED WITH: NELSON,T RN [phone] COVINGTON,N    Chloride 117 (H) 101 - 111 mmol/L   CO2 13 (L) 22 - 32 mmol/L   Glucose, Bld 106 (H) 65 - 99 mg/dL   BUN 150 (H) 6 - 20 mg/dL    Comment: RESULTS CONFIRMED BY MANUAL DILUTION   Creatinine, Ser 18.22 (H) 0.44 - 1.00 mg/dL   Calcium 8.0 (L) 8.9 - 10.3 mg/dL   GFR calc non Af Amer 2 (L) >60 mL/min   GFR calc Af Amer 2 (L) >60 mL/min    Comment: (NOTE) The eGFR has been calculated using the CKD EPI equation. This calculation has not been validated in all clinical situations. eGFR's persistently <60 mL/min signify possible Chronic Kidney Disease.    Anion gap 19 (H) 5 - 15  Troponin I     Status: None   Collection Time: 10/05/15  1:35 AM  Result Value Ref Range   Troponin I 0.03 <0.031 ng/mL    Comment:        NO INDICATION OF MYOCARDIAL INJURY.   CBG monitoring, ED     Status: Abnormal   Collection Time: 10/05/15  6:18 AM  Result Value Ref Range   Glucose-Capillary 62 (L) 65 - 99 mg/dL  MRSA PCR Screening     Status: None   Collection Time: 10/05/15  7:02 AM  Result Value Ref Range   MRSA by PCR NEGATIVE NEGATIVE    Comment:        The GeneXpert MRSA Assay (FDA approved for NASAL specimens only), is one  component of a comprehensive  MRSA colonization surveillance program. It is not intended to diagnose MRSA infection nor to guide or monitor treatment for MRSA infections.   Glucose, capillary     Status: None   Collection Time: 10/05/15  7:05 AM  Result Value Ref Range   Glucose-Capillary 82 65 - 99 mg/dL  Comprehensive metabolic panel     Status: Abnormal   Collection Time: 10/05/15  8:19 AM  Result Value Ref Range   Sodium 149 (H) 135 - 145 mmol/L   Potassium 6.4 (HH) 3.5 - 5.1 mmol/L    Comment: SLIGHT HEMOLYSIS CRITICAL RESULT CALLED TO, READ BACK BY AND VERIFIED WITH: JOSH DRAPER,RN AT 1006 10/05/15 BY ZBEECH.    Chloride 117 (H) 101 - 111 mmol/L   CO2 14 (L) 22 - 32 mmol/L   Glucose, Bld 95 65 - 99 mg/dL   BUN 159 (H) 6 - 20 mg/dL   Creatinine, Ser 18.78 (H) 0.44 - 1.00 mg/dL   Calcium 8.3 (L) 8.9 - 10.3 mg/dL   Total Protein 6.3 (L) 6.5 - 8.1 g/dL   Albumin 2.0 (L) 3.5 - 5.0 g/dL   AST 21 15 - 41 U/L   ALT 16 14 - 54 U/L   Alkaline Phosphatase 84 38 - 126 U/L   Total Bilirubin 0.6 0.3 - 1.2 mg/dL   GFR calc non Af Amer 2 (L) >60 mL/min   GFR calc Af Amer 2 (L) >60 mL/min    Comment: (NOTE) The eGFR has been calculated using the CKD EPI equation. This calculation has not been validated in all clinical situations. eGFR's persistently <60 mL/min signify possible Chronic Kidney Disease.    Anion gap 18 (H) 5 - 15  Phosphorus     Status: Abnormal   Collection Time: 10/05/15  8:19 AM  Result Value Ref Range   Phosphorus 12.3 (H) 2.5 - 4.6 mg/dL    Comment: RESULTS CONFIRMED BY MANUAL DILUTION  CBC with Differential/Platelet     Status: Abnormal   Collection Time: 10/05/15  8:19 AM  Result Value Ref Range   WBC 11.0 (H) 4.0 - 10.5 K/uL   RBC 2.93 (L) 3.87 - 5.11 MIL/uL   Hemoglobin 7.7 (L) 12.0 - 15.0 g/dL   HCT 23.9 (L) 36.0 - 46.0 %   MCV 81.6 78.0 - 100.0 fL   MCH 26.3 26.0 - 34.0 pg   MCHC 32.2 30.0 - 36.0 g/dL   RDW 15.2 11.5 - 15.5 %   Platelets 253 150 -  400 K/uL   Neutrophils Relative % 78 %   Neutro Abs 8.6 (H) 1.7 - 7.7 K/uL   Lymphocytes Relative 9 %   Lymphs Abs 0.9 0.7 - 4.0 K/uL   Monocytes Relative 12 %   Monocytes Absolute 1.3 (H) 0.1 - 1.0 K/uL   Eosinophils Relative 1 %   Eosinophils Absolute 0.1 0.0 - 0.7 K/uL   Basophils Relative 0 %   Basophils Absolute 0.0 0.0 - 0.1 K/uL  Troponin I     Status: Abnormal   Collection Time: 10/05/15  8:19 AM  Result Value Ref Range   Troponin I 0.11 (H) <0.031 ng/mL    Comment:        PERSISTENTLY INCREASED TROPONIN VALUES IN THE RANGE OF 0.04-0.49 ng/mL CAN BE SEEN IN:       -UNSTABLE ANGINA       -CONGESTIVE HEART FAILURE       -MYOCARDITIS       -CHEST TRAUMA       -  ARRYHTHMIAS       -LATE PRESENTING MYOCARDIAL INFARCTION       -COPD   CLINICAL FOLLOW-UP RECOMMENDED.   Type and screen [place]     Status: None   Collection Time: 10/05/15  8:19 AM  Result Value Ref Range   ABO/RH(D) B POS    Antibody Screen NEG    Sample Expiration 10/08/2015   Renal function panel     Status: Abnormal   Collection Time: 10/05/15 10:30 AM  Result Value Ref Range   Sodium 149 (H) 135 - 145 mmol/L   Potassium 6.3 (HH) 3.5 - 5.1 mmol/L    Comment: NO VISIBLE HEMOLYSIS CRITICAL RESULT CALLED TO, READ BACK BY AND VERIFIED WITH: Theressa Millard AT 1121 10/05/15 BY ZBEECH.    Chloride 117 (H) 101 - 111 mmol/L   CO2 15 (L) 22 - 32 mmol/L   Glucose, Bld 93 65 - 99 mg/dL   BUN 161 (H) 6 - 20 mg/dL   Creatinine, Ser 18.70 (H) 0.44 - 1.00 mg/dL   Calcium 8.1 (L) 8.9 - 10.3 mg/dL   Phosphorus 12.3 (H) 2.5 - 4.6 mg/dL    Comment: RESULTS CONFIRMED BY MANUAL DILUTION   Albumin 1.8 (L) 3.5 - 5.0 g/dL   GFR calc non Af Amer 2 (L) >60 mL/min   GFR calc Af Amer 2 (L) >60 mL/min    Comment: (NOTE) The eGFR has been calculated using the CKD EPI equation. This calculation has not been validated in all clinical situations. eGFR's persistently <60 mL/min signify possible Chronic  Kidney Disease.    Anion gap 17 (H) 5 - 15  ALT     Status: None   Collection Time: 10/05/15 10:30 AM  Result Value Ref Range   ALT 14 14 - 54 U/L     ROS:  Pertinent items are noted in HPI.  Physical Exam: Filed Vitals:   10/05/15 0714 10/05/15 1231  BP: 140/96 141/66  Pulse: 108 110  Temp:  98.8 F (37.1 C)  Resp: 23 17     General: 60 year old female, lying in hospital bed, BAD HEENT: MMM Eyes: EOMI Neck: FROM Heart: Distant heart sounds. RRR, no murmurs noted. Lungs: Limited secondary to patient's habitus. Distant breath sounds, but air movement noted bilaterally.  Abdomen: Obese, S, NT, ND Extremities: 2-3_ lower extremity edema noted with chronic venous stasis changes Skin: Warm, dry Neuro: alert, no focal deficits.   Assessment/Plan: 1. Acute on Chronic Renal Failure. Volume overloaded on admission. Admission labs: Cr 18.22. Bicarb 15, phosphorus 12.3. Will start HD.   - HD today and tomorrow 2. Hyperkalemia. K 7.1 on arrival, now down to 6.3 s/p kayexalate, lasix, and insulin. On entresto as outpatient.   - HD as above.  3. Normocytic Anemia. Hgb 7.7 on admission. Was 10.5 two months ago  - Monitor CBC 4. Gout. On uloric.  5. HTN. On metoprolol, clonidine, and amlodipine 6. T2DM. On SSI.  7. HLD. On statin and aspirin per primary.   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  She has massive overload. The AVF is deep partly due to volume and also obesity.  We will attempt cannulation and if fails need a temp cath due to need to treat hyperkalemia and hypervolemia. Jerzy Crotteau C

## 2015-10-05 NOTE — ED Provider Notes (Signed)
I received this patient in signout from Dr. Jeneen Rinks, who had evaluated her for bilateral lower extremity edema and worsening shortness of breath in the setting of known renal failure. We were awaiting lab work. Labs showed a critical potassium of 7.1, BUN 150, creatinine 18, hemoglobin 8, troponin normal, BNP 464. Obtained EKG which showed sinus tachycardia without obvious hyperkalemic changes. Gave the patient calcium gluconate, insulin, and D50 for hyperkalemia treatment. Consulted nephrology and discussed w/ Dr. Justin Mend, who will prepare for emergent dialysis at [facility]. I discussed with [facility] hospitalist, Dr. Hal Hope, who will see patient and admit to hospitalist service at [facility], stepdown.  CRITICAL CARE Performed by: Wenda Overland Mykah Bellomo   Total critical care time: 45 minutes  Critical care time was exclusive of separately billable procedures and treating other patients.  Critical care was necessary to treat or prevent imminent or life-threatening deterioration.  Critical care was time spent personally by me on the following activities: development of treatment plan with patient and/or surrogate as well as nursing, discussions with consultants, evaluation of patient's response to treatment, examination of patient, obtaining history from patient or surrogate, ordering and performing treatments and interventions, ordering and review of laboratory studies, ordering and review of radiographic studies, pulse oximetry and re-evaluation of patient's condition.  Labs Reviewed  BRAIN NATRIURETIC PEPTIDE - Abnormal; Notable for the following:    B Natriuretic Peptide 464.4 (*)    All other components within normal limits  CBC WITH DIFFERENTIAL/PLATELET - Abnormal; Notable for the following:    WBC 11.3 (*)    RBC 2.98 (*)    Hemoglobin 8.0 (*)    HCT 24.1 (*)    Neutro Abs 8.2 (*)    Monocytes Absolute 1.3 (*)    All other components within normal limits  BASIC METABOLIC PANEL - Abnormal; Notable  for the following:    Sodium 149 (*)    Potassium 7.1 (*)    Chloride 117 (*)    CO2 13 (*)    Glucose, Bld 106 (*)    BUN 150 (*)    Creatinine, Ser 18.22 (*)    Calcium 8.0 (*)    GFR calc non Af Amer 2 (*)    GFR calc Af Amer 2 (*)    Anion gap 19 (*)    All other components within normal limits  TROPONIN I  CBC WITH DIFFERENTIAL/PLATELET     Sharlett Iles, MD 10/05/15 [phone]

## 2015-10-05 NOTE — ED Notes (Signed)
[facility] ETA 5 minutes

## 2015-10-05 NOTE — ED Notes (Signed)
Pt had fistula placed in feb 2017, as a proactive means, however pt said she never started dialysis.

## 2015-10-05 NOTE — ED Notes (Signed)
Emergency Contact:   Tomie China (sister) [phone] (home) ; [phone] (cell)  Myda Canchola (sister) [phone]

## 2015-10-05 NOTE — Telephone Encounter (Signed)
**Note De-Identified [name] Obfuscation** I called the pt and was advised by a lady who answered her phone that the pt was admitted into the hospital last night due to her SOB. I asked the lady to have the pt call me back. She verbalized understanding.

## 2015-10-05 NOTE — Procedures (Signed)
Central Venous Catheter Insertion Procedure Note [name] [id number] 07-02-1955  Procedure: Insertion of Central Venous Catheter Indications: for dialysis  Procedure Details Consent: Risks of procedure as well as the alternatives and risks of each were explained to the (patient/caregiver).  Consent for procedure obtained. Time Out: Verified patient identification, verified procedure, site/side was marked, verified correct patient position, special equipment/implants available, medications/allergies/relevent history reviewed, required imaging and test results available.  Performed  Maximum sterile technique was used including antiseptics, cap, gloves, gown, hand hygiene, mask and sheet. Skin prep: Chlorhexidine; local anesthetic administered A triple lumen HD catheter was placed in the right internal jugular vein using the Seldinger technique under real-time US guidance.   Evaluation Blood flow good Complications: No apparent complications Patient did tolerate procedure well. Chest X-ray ordered to verify placement.  CXR: pending.  Baltazar Apo, MD, PhD 10/05/2015, 11:52 AM [facility] [phone] or if no answer [phone]

## 2015-10-05 NOTE — ED Notes (Signed)
Unsuccessful lab draw by this writer. RN made aware. 

## 2015-10-05 NOTE — ED Provider Notes (Signed)
CSN: [id number]     Arrival date & time 10/04/15  2039 History   First MD Initiated Contact with Patient 10/04/15 2235     Chief Complaint  Patient presents with  . Leg Swelling     HPI  She presents for evaluation of difficulty breathing. Also progressive swelling in her legs. Progressive difficulty tolerating ADLs at home.  Has history of stage IV renal disease. Progressive shortness of breath. Seen and evaluated by cardiology this week. I discussed increasing Lasix. Is scheduled for outpatient ultrasound. Has AV graft in her left arm preparing for dialysis ultimately.  He states that for the last 3 days had progressive shortness of breath at home. Her weight is up 40 pounds since summer. Per our chart she is up 15 pounds and 15 days.  Denies fever. A progressively poor appetite. No chest pain. Poor appetite but no nausea or vomiting. Progressive lower extremity edema.   Past Medical History  Diagnosis Date  . Hypertension   . Kidney disease   . High cholesterol   . Shortness of breath dyspnea     with exertion  . H/O blood clots     many years ago in leg  . Sleep apnea     uses c-pap most of time  . Pneumonia   . Diabetes mellitus without complication ([place])     type 2  . Seizures ([place])     had one or two (more than 10-15 years ago)  . Arthritis   . Constipation    Past Surgical History  Procedure Laterality Date  . Abdominal hysterectomy    . Colonoscopy    . Av fistula placement Left 07/25/2015    Procedure: ARTERIOVENOUS (AV) FISTULA CREATION -LEFT BRACHIOCEPHALIC;  Surgeon: Conrad , MD;  Location: [facility] OR;  Service: Vascular;  Laterality: Left;   Family History  Problem Relation Age of Onset  . Cancer Mother   . Hypertension Father   . Heart attack Sister   . Heart attack Paternal Aunt   . Stroke Neg Hx    Social History  Substance Use Topics  . Smoking status: Never Smoker   . Smokeless tobacco: Never Used  . Alcohol Use: No   OB History    No data  available     Review of Systems  Constitutional: Positive for unexpected weight change. Negative for fever, chills, diaphoresis, appetite change and fatigue.  HENT: Negative for mouth sores, sore throat and trouble swallowing.   Eyes: Negative for visual disturbance.  Respiratory: Positive for cough and shortness of breath. Negative for chest tightness and wheezing.   Cardiovascular: Positive for leg swelling. Negative for chest pain.  Gastrointestinal: Negative for nausea, vomiting, abdominal pain, diarrhea and abdominal distention.  Endocrine: Negative for polydipsia, polyphagia and polyuria.  Genitourinary: Negative for dysuria, frequency and hematuria.  Musculoskeletal: Negative for gait problem.  Skin: Negative for color change, pallor and rash.  Neurological: Negative for dizziness, syncope, light-headedness and headaches.  Hematological: Does not bruise/bleed easily.  Psychiatric/Behavioral: Negative for behavioral problems and confusion.      Allergies  Review of patient's allergies indicates no known allergies.  Home Medications   Prior to Admission medications   Medication Sig Start Date End Date Taking? Authorizing Provider  amLODipine (NORVASC) 10 MG tablet Take 10 mg by mouth at bedtime.    Yes Historical Provider, MD  aspirin 81 MG tablet Take 81 mg by mouth daily.   Yes Historical Provider, MD  atorvastatin (LIPITOR) 40 MG  tablet Take 40 mg by mouth daily.   Yes Historical Provider, MD  Cholecalciferol (VITAMIN D PO) Take 1 tablet by mouth daily.   Yes Historical Provider, MD  cloNIDine (CATAPRES) 0.3 MG tablet Take 0.3 mg by mouth 2 (two) times daily.   Yes Historical Provider, MD  febuxostat (ULORIC) 40 MG tablet Take 40 mg by mouth daily.   Yes Historical Provider, MD  furosemide (LASIX) 80 MG tablet Take 80 mg by mouth daily.   Yes Historical Provider, MD  Liraglutide (VICTOZA) 18 MG/3ML SOPN Inject 1.8 mg into the skin daily.    Yes Historical Provider, MD   metoprolol succinate (TOPROL-XL) 100 MG 24 hr tablet Take 100 mg by mouth 2 (two) times daily. Take with or immediately following a meal.   Yes Historical Provider, MD  Multiple Vitamins-Minerals (MULTIVITAMINS THER. W/MINERALS) TABS tablet Take 1 tablet by mouth daily.   Yes Historical Provider, MD  sacubitril-valsartan (ENTRESTO) 24-26 MG Take 1 tablet by mouth 2 (two) times daily.   Yes Historical Provider, MD   BP 166/81 mmHg  Pulse 92  Temp(Src) 97.4 F (36.3 C) (Oral)  Resp 18  Ht 5\' 6"  (1.676 m)  Wt 396 lb 13.3 oz (180 kg)  BMI 64.08 kg/m2  SpO2 97% Physical Exam  Constitutional: She is oriented to person, place, and time. She appears well-developed and well-nourished. No distress.  Morbidly obese black female. Short of breath with conversation.  HENT:  Head: Normocephalic.  Eyes: Conjunctivae are normal. Pupils are equal, round, and reactive to light. No scleral icterus.  Neck: Normal range of motion. Neck supple. No thyromegaly present.  Cardiovascular: Normal rate and regular rhythm.  Exam reveals no gallop and no friction rub.   No murmur heard. Pulmonary/Chest: Effort normal and breath sounds normal. No respiratory distress. She has no wheezes. She has no rales.  Breath sounds diminished secondary to habitus. Wheezing crackles and prolongation.  Abdominal: Soft. Bowel sounds are normal. She exhibits no distension. There is no tenderness. There is no rebound.  Musculoskeletal: Normal range of motion.  Neurological: She is alert and oriented to person, place, and time.  Skin: Skin is warm and dry. No rash noted.  2+ symmetric lower extremity edema. No signs of cellulitis or skin breakdown.  Psychiatric: She has a normal mood and affect. Her behavior is normal.    ED Course  Procedures (including critical care time) Labs Review Labs Reviewed  BRAIN NATRIURETIC PEPTIDE - Abnormal; Notable for the following:    B Natriuretic Peptide 464.4 (*)    All other components  within normal limits  CBC WITH DIFFERENTIAL/PLATELET - Abnormal; Notable for the following:    WBC 11.3 (*)    RBC 2.98 (*)    Hemoglobin 8.0 (*)    HCT 24.1 (*)    Neutro Abs 8.2 (*)    Monocytes Absolute 1.3 (*)    All other components within normal limits  BASIC METABOLIC PANEL - Abnormal; Notable for the following:    Sodium 149 (*)    Potassium 7.1 (*)    Chloride 117 (*)    CO2 13 (*)    Glucose, Bld 106 (*)    BUN 150 (*)    Creatinine, Ser 18.22 (*)    Calcium 8.0 (*)    GFR calc non Af Amer 2 (*)    GFR calc Af Amer 2 (*)    Anion gap 19 (*)    All other components within normal limits  COMPREHENSIVE METABOLIC  PANEL - Abnormal; Notable for the following:    Sodium 149 (*)    Potassium 6.4 (*)    Chloride 117 (*)    CO2 14 (*)    BUN 159 (*)    Creatinine, Ser 18.78 (*)    Calcium 8.3 (*)    Total Protein 6.3 (*)    Albumin 2.0 (*)    GFR calc non Af Amer 2 (*)    GFR calc Af Amer 2 (*)    Anion gap 18 (*)    All other components within normal limits  PHOSPHORUS - Abnormal; Notable for the following:    Phosphorus 12.3 (*)    All other components within normal limits  CBC WITH DIFFERENTIAL/PLATELET - Abnormal; Notable for the following:    WBC 11.0 (*)    RBC 2.93 (*)    Hemoglobin 7.7 (*)    HCT 23.9 (*)    Neutro Abs 8.6 (*)    Monocytes Absolute 1.3 (*)    All other components within normal limits  TROPONIN I - Abnormal; Notable for the following:    Troponin I 0.11 (*)    All other components within normal limits  RENAL FUNCTION PANEL - Abnormal; Notable for the following:    Sodium 149 (*)    Potassium 6.3 (*)    Chloride 117 (*)    CO2 15 (*)    BUN 161 (*)    Creatinine, Ser 18.70 (*)    Calcium 8.1 (*)    Phosphorus 12.3 (*)    Albumin 1.8 (*)    GFR calc non Af Amer 2 (*)    GFR calc Af Amer 2 (*)    Anion gap 17 (*)    All other components within normal limits  RENAL FUNCTION PANEL - Abnormal; Notable for the following:    Sodium 146  (*)    CO2 17 (*)    BUN 103 (*)    Creatinine, Ser 13.34 (*)    Calcium 7.9 (*)    Phosphorus 9.3 (*)    Albumin 1.7 (*)    GFR calc non Af Amer 3 (*)    GFR calc Af Amer 3 (*)    Anion gap 19 (*)    All other components within normal limits  GLUCOSE, CAPILLARY - Abnormal; Notable for the following:    Glucose-Capillary 64 (*)    All other components within normal limits  TROPONIN I - Abnormal; Notable for the following:    Troponin I 11.23 (*)    All other components within normal limits  TROPONIN I - Abnormal; Notable for the following:    Troponin I 0.06 (*)    All other components within normal limits  TROPONIN I - Abnormal; Notable for the following:    Troponin I 0.06 (*)    All other components within normal limits  HEPARIN LEVEL (UNFRACTIONATED) - Abnormal; Notable for the following:    Heparin Unfractionated 0.74 (*)    All other components within normal limits  RENAL FUNCTION PANEL - Abnormal; Notable for the following:    CO2 18 (*)    BUN 103 (*)    Creatinine, Ser 13.57 (*)    Calcium 7.9 (*)    Phosphorus 9.4 (*)    Albumin 1.6 (*)    GFR calc non Af Amer 3 (*)    GFR calc Af Amer 3 (*)    Anion gap 18 (*)    All other components within normal limits  CBC - Abnormal; Notable for the following:    WBC 12.6 (*)    RBC 2.60 (*)    Hemoglobin 6.9 (*)    HCT 21.1 (*)    All other components within normal limits  OCCULT BLOOD X 1 CARD TO LAB, STOOL - Abnormal; Notable for the following:    Fecal Occult Bld POSITIVE (*)    All other components within normal limits  GLUCOSE, CAPILLARY - Abnormal; Notable for the following:    Glucose-Capillary 122 (*)    All other components within normal limits  CBG MONITORING, ED - Abnormal; Notable for the following:    Glucose-Capillary 62 (*)    All other components within normal limits  POCT I-STAT, CHEM 8 - Abnormal; Notable for the following:    Sodium 147 (*)    Chloride 113 (*)    BUN 120 (*)    Creatinine, Ser  15.20 (*)    Calcium, Ion 1.07 (*)    Hemoglobin 8.5 (*)    HCT 25.0 (*)    All other components within normal limits  MRSA PCR SCREENING  TROPONIN I  GLUCOSE, CAPILLARY  HEPATITIS B SURFACE ANTIGEN  HEPATITIS B SURFACE ANTIBODY  HEPATITIS B CORE ANTIBODY, TOTAL  ALT  GLUCOSE, CAPILLARY  GLUCOSE, CAPILLARY  GLUCOSE, CAPILLARY  GLUCOSE, CAPILLARY  GLUCOSE, CAPILLARY  GLUCOSE, CAPILLARY  GLUCOSE, CAPILLARY  CBC WITH DIFFERENTIAL/PLATELET  CBC  RENAL FUNCTION PANEL  PTH, INTACT AND CALCIUM  HEMOGLOBIN A1C  IRON AND TIBC  FERRITIN  VITAMIN B12  FOLATE  TSH  HEPARIN LEVEL (UNFRACTIONATED)  TYPE AND SCREEN  ABO/RH  PREPARE RBC (CROSSMATCH)    Imaging Review Dg Chest Port 1 View  10/05/2015  CLINICAL DATA:  Central line placement. EXAM: PORTABLE CHEST 1 VIEW COMPARISON:  10/04/2015 FINDINGS: Right central line placement with the tip in the SVC. No pneumothorax. Heart is mildly enlarged. Mild vascular congestion. Mild interstitial prominence similar prior study. No acute bony abnormality. IMPRESSION: Right central line tip in the SVC. No pneumothorax. Otherwise no real change. Electronically Signed   By: Rolm Baptise M.D.   On: 10/05/2015 12:17   Dg Chest Port 1 View  10/04/2015  CLINICAL DATA:  Chest pain, shortness of breath, and cough for 1 week, worse tonight. EXAM: PORTABLE CHEST 1 VIEW COMPARISON:  09/28/2015 FINDINGS: Shallow inspiration. Cardiac enlargement with pulmonary vascular congestion. Mild interstitial pattern suggesting early interstitial edema. Similar appearance to previous study. No blunting of costophrenic angles. No pneumothorax. Mediastinal contours appear intact. IMPRESSION: Cardiac enlargement with pulmonary vascular congestion and mild interstitial edema similar to prior study. Electronically Signed   By: Lucienne Capers M.D.   On: 10/04/2015 23:29   I have personally reviewed and evaluated these images and lab results as part of my medical  decision-making.   EKG Interpretation None      MDM   Final diagnoses:  Renal failure  Hyperkalemia  Hypoxia    Given albuterol neb. Chest x-ray shows CHF with pulmonary edema. Minimal change versus comparison. Awaiting labs. May need admission for dialysis.   Tanna Furry, MD 10/06/15 [phone]

## 2015-10-05 NOTE — Progress Notes (Signed)
Received pt from [facility] at [phone], transferred to [facility] without incident.  Pt weight 415lbs.  Re-checked CBG with result 82.  Bedside report to Neysa Hotter, RN.

## 2015-10-06 DIAGNOSIS — E1122 Type 2 diabetes mellitus with diabetic chronic kidney disease: Secondary | ICD-10-CM

## 2015-10-06 DIAGNOSIS — R7989 Other specified abnormal findings of blood chemistry: Secondary | ICD-10-CM | POA: Diagnosis present

## 2015-10-06 DIAGNOSIS — J810 Acute pulmonary edema: Secondary | ICD-10-CM

## 2015-10-06 DIAGNOSIS — D649 Anemia, unspecified: Secondary | ICD-10-CM

## 2015-10-06 DIAGNOSIS — R778 Other specified abnormalities of plasma proteins: Secondary | ICD-10-CM | POA: Diagnosis present

## 2015-10-06 DIAGNOSIS — N182 Chronic kidney disease, stage 2 (mild): Secondary | ICD-10-CM

## 2015-10-06 LAB — RENAL FUNCTION PANEL
ALBUMIN: 1.7 g/dL — AB (ref 3.5–5.0)
Albumin: 1.6 g/dL — ABNORMAL LOW (ref 3.5–5.0)
Anion gap: 19 — ABNORMAL HIGH (ref 5–15)
Anion gap: 18 — ABNORMAL HIGH (ref 5–15)
BUN: 103 mg/dL — AB (ref 6–20)
BUN: 103 mg/dL — AB (ref 6–20)
CALCIUM: 7.9 mg/dL — AB (ref 8.9–10.3)
CHLORIDE: 110 mmol/L (ref 101–111)
CO2: 17 mmol/L — ABNORMAL LOW (ref 22–32)
CO2: 18 mmol/L — AB (ref 22–32)
CREATININE: 13.34 mg/dL — AB (ref 0.44–1.00)
CREATININE: 13.57 mg/dL — AB (ref 0.44–1.00)
Calcium: 7.9 mg/dL — ABNORMAL LOW (ref 8.9–10.3)
Chloride: 109 mmol/L (ref 101–111)
GFR, EST AFRICAN AMERICAN: 3 mL/min — AB (ref >60)
GFR, EST AFRICAN AMERICAN: 3 mL/min — AB (ref >60)
GFR, EST NON AFRICAN AMERICAN: 3 mL/min — AB (ref >60)
GFR, EST NON AFRICAN AMERICAN: 3 mL/min — AB (ref >60)
Glucose, Bld: 72 mg/dL (ref 65–99)
Glucose, Bld: 87 mg/dL (ref 65–99)
PHOSPHORUS: 9.3 mg/dL — AB (ref 2.5–4.6)
Phosphorus: 9.4 mg/dL — ABNORMAL HIGH (ref 2.5–4.6)
Potassium: 5.1 mmol/L (ref 3.5–5.1)
Potassium: 5 mmol/L (ref 3.5–5.1)
SODIUM: 146 mmol/L — AB (ref 135–145)
SODIUM: 145 mmol/L (ref 135–145)

## 2015-10-06 LAB — CBC
HCT: 21.1 % — ABNORMAL LOW (ref 36.0–46.0)
Hemoglobin: 6.9 g/dL — CL (ref 12.0–15.0)
MCH: 26.5 pg (ref 26.0–34.0)
MCHC: 32.7 g/dL (ref 30.0–36.0)
MCV: 81.2 fL (ref 78.0–100.0)
PLATELETS: 222 10*3/uL (ref 150–400)
RBC: 2.6 MIL/uL — AB (ref 3.87–5.11)
RDW: 15.1 % (ref 11.5–15.5)
WBC: 12.6 10*3/uL — AB (ref 4.0–10.5)

## 2015-10-06 LAB — GLUCOSE, CAPILLARY
GLUCOSE-CAPILLARY: 81 mg/dL (ref 65–99)
GLUCOSE-CAPILLARY: 88 mg/dL (ref 65–99)
GLUCOSE-CAPILLARY: 68 mg/dL (ref 65–99)
GLUCOSE-CAPILLARY: 96 mg/dL (ref 65–99)
Glucose-Capillary: 122 mg/dL — ABNORMAL HIGH (ref 65–99)
Glucose-Capillary: 64 mg/dL — ABNORMAL LOW (ref 65–99)
Glucose-Capillary: 72 mg/dL (ref 65–99)
Glucose-Capillary: 98 mg/dL (ref 65–99)

## 2015-10-06 LAB — HEPATITIS B CORE ANTIBODY, TOTAL: HEP B C TOTAL AB: NEGATIVE

## 2015-10-06 LAB — TROPONIN I
Troponin I: 11.23 ng/mL (ref <0.031)
Troponin I: 0.06 ng/mL — ABNORMAL HIGH (ref <0.031)
Troponin I: 0.06 ng/mL — ABNORMAL HIGH (ref <0.031)

## 2015-10-06 LAB — PREPARE RBC (CROSSMATCH)

## 2015-10-06 LAB — HEPATITIS B SURFACE ANTIBODY,QUALITATIVE: HEP B S AB: NONREACTIVE

## 2015-10-06 LAB — HEPATITIS B SURFACE ANTIGEN: HEP B S AG: NEGATIVE

## 2015-10-06 LAB — HEPARIN LEVEL (UNFRACTIONATED): HEPARIN UNFRACTIONATED: 0.74 [IU]/mL — AB (ref 0.30–0.70)

## 2015-10-06 LAB — OCCULT BLOOD X 1 CARD TO LAB, STOOL: FECAL OCCULT BLD: POSITIVE — AB

## 2015-10-06 MED ORDER — ALTEPLASE 2 MG IJ SOLR: 2.0000 mg | Freq: Once | INTRAMUSCULAR | Status: DC | PRN

## 2015-10-06 MED ORDER — NITROGLYCERIN 0.4 MG SL SUBL: 0.4000 mg | SUBLINGUAL_TABLET | SUBLINGUAL | Status: DC | PRN

## 2015-10-06 MED ORDER — HEPARIN (PORCINE) IN NACL 100-0.45 UNIT/ML-% IJ SOLN
1850.0000 [IU]/h | INTRAMUSCULAR | Status: DC
  Administered 2015-10-06: 1850 [IU]/h via INTRAVENOUS
  Administered 2015-10-06: 1950 [IU]/h via INTRAVENOUS
  Administered 2015-10-07 – 2015-10-08 (×2): 1850 [IU]/h via INTRAVENOUS
  Filled 2015-10-06 (×5): qty 250

## 2015-10-06 MED ORDER — LIDOCAINE-PRILOCAINE 2.5-2.5 % EX CREA
1.0000 "application " | TOPICAL_CREAM | CUTANEOUS | Status: DC | PRN
  Filled 2015-10-06: qty 5

## 2015-10-06 MED ORDER — DEXTROSE 50 % IV SOLN
INTRAVENOUS | Status: AC
  Administered 2015-10-06: 50 mL
  Filled 2015-10-06: qty 50

## 2015-10-06 MED ORDER — HEPARIN SODIUM (PORCINE) 1000 UNIT/ML DIALYSIS: 1000.0000 [IU] | INTRAMUSCULAR | Status: DC | PRN

## 2015-10-06 MED ORDER — SODIUM CHLORIDE 0.9 % IV SOLN: 100.0000 mL | INTRAVENOUS | Status: DC | PRN

## 2015-10-06 MED ORDER — LIDOCAINE HCL (PF) 1 % IJ SOLN: 5.0000 mL | INTRAMUSCULAR | Status: DC | PRN

## 2015-10-06 MED ORDER — ASPIRIN 81 MG PO CHEW
324.0000 mg | CHEWABLE_TABLET | Freq: Once | ORAL | Status: AC
  Administered 2015-10-06: 324 mg via ORAL
  Filled 2015-10-06: qty 4

## 2015-10-06 MED ORDER — PENTAFLUOROPROP-TETRAFLUOROETH EX AERO: 1.0000 "application " | INHALATION_SPRAY | CUTANEOUS | Status: DC | PRN

## 2015-10-06 MED ORDER — HEPARIN BOLUS VIA INFUSION
4000.0000 [IU] | Freq: Once | INTRAVENOUS | Status: AC
  Administered 2015-10-06: 4000 [IU] via INTRAVENOUS
  Filled 2015-10-06: qty 4000

## 2015-10-06 MED ORDER — HEPARIN SODIUM (PORCINE) 1000 UNIT/ML DIALYSIS: 20.0000 [IU]/kg | INTRAMUSCULAR | Status: DC | PRN

## 2015-10-06 MED ORDER — SODIUM CHLORIDE 0.9 % IV SOLN: Freq: Once | INTRAVENOUS | Status: DC

## 2015-10-06 NOTE — Consult Note (Signed)
CARDIOLOGY CONSULT NOTE       Patient ID: [name] MRN: [id number] DOB/AGE: 11/14/1955 60 y.o.  Admit date: 10/04/2015 Referring Physician:  Short Primary Physician: Dwan Bolt, MD Primary Cardiologist:  Irish Lack Reason for Consultation:  Elevated Troponin  Principal Problem:   Acute pulmonary edema ([place]) Active Problems:   Morbid obesity ([place])   Renal failure (ARF), acute on chronic (HCC)   Chronic anemia   Type 2 diabetes mellitus with renal complication (HCC)   Hypertension   HPI:  60 y.o. patient of Dr Beau Fanny Just seen 10/03/15.  Morbid obesity and stage IV chronic kidney disease who presents for heart failure symptoms. She has had fluid retention over the past month. Her weight is up 50 pounds in the last 2-1/2 months. She had a left  AV fistula placed in anticipation of dialysis for her chronic kidney disease. She notes increased leg swelling. She continues to sleep on 2 pillows. She is quite limited by her obesity. She denies any chest discomfort, pain or tightness. She was admitted with Cr 18 K over 7 and acidotic Had right IJ catheter placed and had first dialysis yesterday.  She has no history of CAD.  No chest pain. Currently breathing a bit better. Making some urine.  BM's after receiving kayexylate.    ROS All other systems reviewed and negative except as noted above  Past Medical History  Diagnosis Date  . Hypertension   . Kidney disease   . High cholesterol   . Shortness of breath dyspnea     with exertion  . H/O blood clots     many years ago in leg  . Sleep apnea     uses c-pap most of time  . Pneumonia   . Diabetes mellitus without complication ([place])     type 2  . Seizures ([place])     had one or two (more than 10-15 years ago)  . Arthritis   . Constipation     Family History  Problem Relation Age of Onset  . Cancer Mother   . Hypertension Father   . Heart attack Sister   . Heart attack Paternal Aunt   . Stroke Neg Hx     Social  History   Social History  . Marital Status: Single    Spouse Name: N/A  . Number of Children: N/A  . Years of Education: N/A   Occupational History  . Not on file.   Social History Main Topics  . Smoking status: Never Smoker   . Smokeless tobacco: Never Used  . Alcohol Use: No  . Drug Use: No  . Sexual Activity: Not on file   Other Topics Concern  . Not on file   Social History Narrative    Past Surgical History  Procedure Laterality Date  . Abdominal hysterectomy    . Colonoscopy    . Av fistula placement Left 07/25/2015    Procedure: ARTERIOVENOUS (AV) FISTULA CREATION -LEFT BRACHIOCEPHALIC;  Surgeon: Conrad [place], MD;  Location: [place];  Service: Vascular;  Laterality: Left;     . amLODipine  10 mg Oral QHS  . aspirin EC  81 mg Oral Daily  . atorvastatin  40 mg Oral Daily  . cloNIDine  0.2 mg Oral BID  . febuxostat  40 mg Oral Daily  . metoprolol succinate  50 mg Oral BID  . multivitamin with minerals  1 tablet Oral Daily  . sevelamer carbonate  2,400 mg Oral TID WC  .  sodium chloride flush  3 mL Intravenous Q12H   . heparin 1,950 Units/hr (10/06/15 1134)    Physical Exam: Blood pressure 163/74, pulse 109, temperature 99.2 F (37.3 C), temperature source Oral, resp. rate 19, height 5\' 6"  (1.676 m), weight 188.243 kg (415 lb), SpO2 94 %.    Affect appropriate Morbidly obese black female  HEENT: normal Neck supple with no adenopathy JVP not visible RIJ dialysis catheter  Lungs clear with no wheezing and good diaphragmatic motion Heart:  S1/S2 no murmur, no rub, gallop or click PMI normal Abdomen: benighn, BS positve, no tenderness, no AAA no bruit.  No HSM or HJR Fistula LUE  Plus 3 edema  Neuro non-focal Skin warm and dry No muscular weakness   Labs:   Lab Results  Component Value Date   WBC 11.0* 10/05/2015   HGB 8.5* 10/05/2015   HCT 25.0* 10/05/2015   MCV 81.6 10/05/2015   PLT 253 10/05/2015    Recent Labs Lab 10/05/15 0819  10/05/15 1030  10/06/15 0802  NA 149* 149*  < > 146*  K 6.4* 6.3*  < > 5.1  CL 117* 117*  < > 110  CO2 14* 15*  --  17*  BUN 159* 161*  < > 103*  CREATININE 18.78* 18.70*  < > 13.34*  CALCIUM 8.3* 8.1*  --  7.9*  PROT 6.3*  --   --   --   BILITOT 0.6  --   --   --   ALKPHOS 84  --   --   --   ALT 16 14  --   --   AST 21  --   --   --   GLUCOSE 95 93  < > 72  < > = values in this interval not displayed. Lab Results  Component Value Date   TROPONINI 11.23* 10/06/2015   No results found for: CHOL No results found for: HDL No results found for: LDLCALC No results found for: TRIG No results found for: CHOLHDL No results found for: LDLDIRECT    Radiology: Dg Chest Port 1 View  10/05/2015  CLINICAL DATA:  Central line placement. EXAM: PORTABLE CHEST 1 VIEW COMPARISON:  10/04/2015 FINDINGS: Right central line placement with the tip in the SVC. No pneumothorax. Heart is mildly enlarged. Mild vascular congestion. Mild interstitial prominence similar prior study. No acute bony abnormality. IMPRESSION: Right central line tip in the SVC. No pneumothorax. Otherwise no real change. Electronically Signed   By: Rolm Baptise M.D.   On: 10/05/2015 12:17   Dg Chest Port 1 View  10/04/2015  CLINICAL DATA:  Chest pain, shortness of breath, and cough for 1 week, worse tonight. EXAM: PORTABLE CHEST 1 VIEW COMPARISON:  09/28/2015 FINDINGS: Shallow inspiration. Cardiac enlargement with pulmonary vascular congestion. Mild interstitial pattern suggesting early interstitial edema. Similar appearance to previous study. No blunting of costophrenic angles. No pneumothorax. Mediastinal contours appear intact. IMPRESSION: Cardiac enlargement with pulmonary vascular congestion and mild interstitial edema similar to prior study. Electronically Signed   By: Lucienne Capers M.D.   On: 10/04/2015 23:29    EKG: ST no acute ST changes   ASSESSMENT AND PLAN:   Elevated Troponin:  In setting of A/CRF, no ECG changes and  no chest pain. Given morbid obesity no non invasive alternatives to r/o CAD and cath currently not indicated. Will Observe and trend troponins, follow ECG and get echo for RV/LV function   A/CRF:  LUE fistula deep and not used. RIJ access  plan per nephrology volume improved  Anemia:  Likely from CRF [place] per nephrology  HTN:  Well controlled.  Continue current medications and low sodium Dash type diet.    Chol:  On statin    Signed: Jenkins Rouge 10/06/2015, 12:20 PM

## 2015-10-06 NOTE — Progress Notes (Addendum)
ANTICOAGULATION CONSULT NOTE - Initial Consult  Pharmacy Consult for Heparin Indication: chest pain/ACS  No Known Allergies  Patient Measurements: Height: 5\' 6"  (167.6 cm) Weight: (!) 415 lb (188.243 kg) IBW/kg (Calculated) : 59.3 Heparin Dosing Weight: 108.3kg  Vital Signs: Temp: 99.2 F (37.3 C) (05/06 0813) Temp Source: Oral (05/06 0813) BP: 163/74 mmHg (05/06 0813) Pulse Rate: 109 (05/06 0813)  Labs:  Recent Labs  10/05/15 0100  10/05/15 0135 10/05/15 0819 10/05/15 1030 10/05/15 1430 10/06/15 0802 10/06/15 0930  HGB 8.0*  --   --  7.7*  --  8.5*  --   --   HCT 24.1*  --   --  23.9*  --  25.0*  --   --   PLT 247  --   --  253  --   --   --   --   CREATININE  --   < > 18.22* 18.78* 18.70* 15.20* 13.34*  --   TROPONINI  --   --  0.03 0.11*  --   --   --  11.23*  < > = values in this interval not displayed.  Estimated Creatinine Clearance: 8 mL/min (by C-G formula based on Cr of 13.34).  Medical History: Past Medical History  Diagnosis Date  . Hypertension   . Kidney disease   . High cholesterol   . Shortness of breath dyspnea     with exertion  . H/O blood clots     many years ago in leg  . Sleep apnea     uses c-pap most of time  . Pneumonia   . Diabetes mellitus without complication ([place])     type 2  . Seizures ([place])     had one or two (more than 10-15 years ago)  . Arthritis   . Constipation    Assessment: Pt. presents with complaints of SOB, found to have an elevated troponin (11.23).  We have been asked to start IV heparin for r/o MI.  She has chronic stage IV renal disease and is requiring urgent HD for electrolyte correction.  She is morbidly obese and we will use adjusted body weight for dosing her IV heparin therapy.  She has a h/o blood clots in her leg from many years ago but is not on any current anticoagulation.  Lab results indicate a low H/H of 8.5/25 but no acute bleeding noted.   Goal of Therapy:  Heparin level 0.3-0.7  units/ml Monitor platelets by anticoagulation protocol: Yes   Plan:  Heparin 4000 units IV bolus x 1 then begin IV infusion at 1950 units/hr using [place] nomogram. Check 6 hour heparin level and adjust as needed Daily heparin level/CBC Discontinue SQ heparin  Rober Minion, PharmD., MS Clinical Pharmacist Pager:  [phone] Thank you for allowing pharmacy to be part of this patients care team. 10/06/2015,10:46 AM

## 2015-10-06 NOTE — Progress Notes (Signed)
ANTICOAGULATION CONSULT NOTE  Pharmacy Consult for Heparin Indication: chest pain/ACS  No Known Allergies  Patient Measurements: Height: 5\' 6"  (167.6 cm) Weight: (!) 396 lb 13.3 oz (180 kg) IBW/kg (Calculated) : 59.3 Heparin Dosing Weight: 108.3kg  Vital Signs: Temp: 98.3 F (36.8 C) (05/06 1911) Temp Source: Oral (05/06 1911) BP: 175/91 mmHg (05/06 1911) Pulse Rate: 95 (05/06 1911)  Labs:  Recent Labs  10/05/15 0100  10/05/15 0819  10/05/15 1430 10/06/15 0802 10/06/15 0930 10/06/15 1438 10/06/15 1926  HGB 8.0*  --  7.7*  --  8.5*  --   --  6.9*  --   HCT 24.1*  --  23.9*  --  25.0*  --   --  21.1*  --   PLT 247  --  253  --   --   --   --  222  --   HEPARINUNFRC  --   --   --   --   --   --   --   --  0.74*  CREATININE  --   < > 18.78*  < > 15.20* 13.34*  --  13.57*  --   TROPONINI  --   < > 0.11*  --   --   --  11.23* 0.06*  --   < > = values in this interval not displayed.  Estimated Creatinine Clearance: 7.7 mL/min (by C-G formula based on Cr of 13.57).  Medical History: Past Medical History  Diagnosis Date  . Hypertension   . Kidney disease   . High cholesterol   . Shortness of breath dyspnea     with exertion  . H/O blood clots     many years ago in leg  . Sleep apnea     uses c-pap most of time  . Pneumonia   . Diabetes mellitus without complication ([place])     type 2  . Seizures ([place])     had one or two (more than 10-15 years ago)  . Arthritis   . Constipation    Assessment: Pt. presents with complaints of SOB, found to have an elevated troponin (11.23).  Pharmacy consulted to start IV heparin for ACS.  She has a h/o blood clots in her leg from many years ago but is not on any current anticoagulation. Lab results indicate a low H/H of 8.5/25 but no acute bleeding noted.   Initial HL slightly supratherapeutic (0.74) on 1950 units/h. No bleed/IV line issues per RN.  Goal of Therapy:  Heparin level 0.3-0.7 units/ml Monitor platelets by  anticoagulation protocol: Yes   Plan:  Decrease heparin IV infusion to 1850 units/h Check 8 hour heparin level Daily heparin level/CBC Monitor s/sx bleeding  Elicia Lamp, PharmD, BCPS Clinical Pharmacist Pager [phone] 10/06/2015 8:01 PM

## 2015-10-06 NOTE — Progress Notes (Signed)
Pt transferred to [place] without incident. Bedside report to [place], Therapist, sports.

## 2015-10-06 NOTE — Progress Notes (Signed)
PROGRESS NOTE  [name]  [id number] DOB: [birth year] DOA: 10/04/2015 PCP: Dwan Bolt, MD Outpatient Specialists:  Dr. Florene Glen, Nephrology  Brief Narrative:   [name] is a 60 y.o. 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 Patient has been having gradually worsening shortness of breath over the last 10 days. Denies any chest pain or productive cough fever chills. Patient states she has gained a lot of weight with peripheral edema. In the ER chest x-ray shows congestion and labs reviewed markedly elevated creatinine and BUN with no old labs to compare. Potassium is also elevated at 7.1 with no EKG changes. Patient also has metabolic acidosis. Patient was given calcium gluconate Lasix insulin and dextrose and Kayexalate.  The patient was transferred to [facility] for hemodialysis.  Assessment & Plan:   Principal Problem:   Acute pulmonary edema (HCC) Active Problems:   Morbid obesity ([place])   Renal failure (ARF), acute on chronic (HCC)   Chronic anemia   Type 2 diabetes mellitus with renal complication (HCC)   Hypertension   Elevated troponin   Acute pulmonary edema, secondary to acute renal failure, improving -  Had 4L removed by HD yesterday -  Diuretics per nephrology  Hyperkalemia, 7.1 at admission, secondary to acute renal failure, resolved with Lasix, Kayexalate, dextrose and insulin, and hemodialysis   Elevated troponin, possible NSTEMI, troponin 11 today.   -  Start heparin gtt -  Continue beta blocker and high dose statin -  Additional ASA 325mg  today -  ECG:  Unchanged, no acute ST segment changes or T-wave inversions -  Patient remains chest pain free -  Start prn nitroglycerin -  Cardiology consult -  ECHO  Hypernatremia, metabolic acidosis and hyperphosphatemia all secondary to acute on chronic renal  failure. Improving with HD -  HD per nephrology -  Check PTH   Acute on chronic kidney disease, acute renal failure  -  Left arm fistula was placed 07/25/2015 and is likely not mature enough to for use for hemodialysis  -  Right IJ temporary HD catheter placed by PCCM on 10/05/2015 -  Nephrology assistance appreciated -  Started HD on 10/05/2015, session #1   Diabetes mellitus type 2, hypoglycemic this morning -  Check a1c -  D/c low dose SSI but continue CBG  Essential hypertension, blood pressures elevated.  Anticipate that her blood pressures may trending down with hemodialysis -  Clonidine decreased to 0.2mg  BID -  Continue amlodipine 10 mg, metoprolol 100 mg by mouth twice a day -  Continue hydralazine when necessary  Gout, stable, continue uloric  Normocytic anemia, likely due to renal failure -  Iron studies, B12, folate -  TSH -  Occult stool -  Transfuse 1 unit PRBC -  Repeat hgb in AM  DVT prophylaxis:  heparin Code Status:  Full code Family Communication:  Patient alone Disposition Plan:  Anticipate that she can transfer out of stepdown after first HD session to bed on 6E.  Will likely continue HD through thte weekend and probably discharge with outpatient HD sometime next week.  CM and SW to assist.     Consultants:   Nephrology  Procedures:  Temporary HD catheter placed by PCCM on 10/05/2015  Antimicrobials:   none   Subjective: Patient states that she feels much better. Her breathing is much easier. She has made some urine after  receiving Lasix. She has had multiple bowel movements which she was told to expect the Kayexalate.  Objective: Filed Vitals:   10/06/15 1505 10/06/15 1532 10/06/15 1556 10/06/15 1601  BP: 169/89 158/86 169/89 168/69  Pulse: 108 107 102 104  Temp:    98.5 F (36.9 C)  TempSrc:    Oral  Resp:      Height:      Weight:      SpO2:        Intake/Output Summary (Last 24 hours) at 10/06/15 1602 Last data filed at 10/06/15 1027   Gross per 24 hour  Intake    166 ml  Output   4000 ml  Net  -3834 ml   Filed Weights   10/05/15 1702 10/06/15 0500 10/06/15 1500  Weight: 181.81 kg (400 lb 13.1 oz) 188.243 kg (415 lb) 185.521 kg (409 lb)    Examination:  General exam:  Adult Female.  Mild respiratory distress with SCM and subcostal retractions.  Somnolent today HEENT:  NCAT, MMM,  Respiratory system:  Diminished bilateral breath sounds, no focal rales, rhonchi, or wheeze Cardiovascular system: S1 & S2 heard, RRR. No JVD, murmurs, rubs, gallops or clicks.  Warm extremities Gastrointestinal system: Abdomen is nondistended, soft and nontender. No organomegaly or masses felt. Normal bowel sounds heard. Skin: No rashes, lesions or ulcers. Skin feels edematous throughout MSK:  Normal tone and bulk, + lower extremity edema, obese.  Left arm fistula feels small but has good bruit and thrill. Neuro:  Cranial nerves II through XII grossly intact, no obvious focal neurologic deficits  Data Reviewed: I have personally reviewed following labs and imaging studies  CBC:  Recent Labs Lab 10/05/15 0100 10/05/15 0819 10/05/15 1430 10/06/15 1438  WBC 11.3* 11.0*  --  12.6*  NEUTROABS 8.2* 8.6*  --   --   HGB 8.0* 7.7* 8.5* 6.9*  HCT 24.1* 23.9* 25.0* 21.1*  MCV 80.9 81.6  --  81.2  PLT 247 253  --  [id number]   Basic Metabolic Panel:  Recent Labs Lab 10/05/15 0135 10/05/15 0819 10/05/15 1030 10/05/15 1430 10/06/15 0802 10/06/15 1438  NA 149* 149* 149* 147* 146* 145  K 7.1* 6.4* 6.3* 4.6 5.1 5.0  CL 117* 117* 117* 113* 110 109  CO2 13* 14* 15*  --  17* 18*  GLUCOSE 106* 95 93 88 72 87  BUN 150* 159* 161* 120* 103* 103*  CREATININE 18.22* 18.78* 18.70* 15.20* 13.34* 13.57*  CALCIUM 8.0* 8.3* 8.1*  --  7.9* 7.9*  PHOS  --  12.3* 12.3*  --  9.3* 9.4*   GFR: Estimated Creatinine Clearance: 7.8 mL/min (by C-G formula based on Cr of 13.57). Liver Function Tests:  Recent Labs Lab 10/05/15 0819 10/05/15 1030  10/06/15 0802 10/06/15 1438  AST 21  --   --   --   ALT 16 14  --   --   ALKPHOS 84  --   --   --   BILITOT 0.6  --   --   --   PROT 6.3*  --   --   --   ALBUMIN 2.0* 1.8* 1.7* 1.6*   No results for input(s): LIPASE, AMYLASE in the last 168 hours. No results for input(s): AMMONIA in the last 168 hours. Coagulation Profile: No results for input(s): INR, PROTIME in the last 168 hours. Cardiac Enzymes:  Recent Labs Lab 10/05/15 0135 10/05/15 0819 10/06/15 0930  TROPONINI 0.03 0.11* 11.23*   BNP (last 3 results) No  results for input(s): PROBNP in the last 8760 hours. HbA1C: No results for input(s): HGBA1C in the last 72 hours. CBG:  Recent Labs Lab 10/05/15 2346 10/06/15 0350 10/06/15 0756 10/06/15 0846 10/06/15 1157  GLUCAP 72 81 64* 98 88   Lipid Profile: No results for input(s): CHOL, HDL, LDLCALC, TRIG, CHOLHDL, LDLDIRECT in the last 72 hours. Thyroid Function Tests: No results for input(s): TSH, T4TOTAL, FREET4, T3FREE, THYROIDAB in the last 72 hours. Anemia Panel: No results for input(s): VITAMINB12, FOLATE, FERRITIN, TIBC, IRON, RETICCTPCT in the last 72 hours. Urine analysis: No results found for: COLORURINE, APPEARANCEUR, LABSPEC, PHURINE, GLUCOSEU, HGBUR, BILIRUBINUR, KETONESUR, PROTEINUR, UROBILINOGEN, NITRITE, LEUKOCYTESUR Sepsis Labs: @LABRCNTIP (procalcitonin:4,lacticidven:4)  ) Recent Results (from the past 240 hour(s))  MRSA PCR Screening     Status: None   Collection Time: 10/05/15  7:02 AM  Result Value Ref Range Status   MRSA by PCR NEGATIVE NEGATIVE Final    Comment:        The GeneXpert MRSA Assay (FDA approved for NASAL specimens only), is one component of a comprehensive MRSA colonization surveillance program. It is not intended to diagnose MRSA infection nor to guide or monitor treatment for MRSA infections.       Radiology Studies: Dg Chest Port 1 View  10/05/2015  CLINICAL DATA:  Central line placement. EXAM: PORTABLE CHEST 1  VIEW COMPARISON:  10/04/2015 FINDINGS: Right central line placement with the tip in the SVC. No pneumothorax. Heart is mildly enlarged. Mild vascular congestion. Mild interstitial prominence similar prior study. No acute bony abnormality. IMPRESSION: Right central line tip in the SVC. No pneumothorax. Otherwise no real change. Electronically Signed   By: Rolm Baptise M.D.   On: 10/05/2015 12:17   Dg Chest Port 1 View  10/04/2015  CLINICAL DATA:  Chest pain, shortness of breath, and cough for 1 week, worse tonight. EXAM: PORTABLE CHEST 1 VIEW COMPARISON:  09/28/2015 FINDINGS: Shallow inspiration. Cardiac enlargement with pulmonary vascular congestion. Mild interstitial pattern suggesting early interstitial edema. Similar appearance to previous study. No blunting of costophrenic angles. No pneumothorax. Mediastinal contours appear intact. IMPRESSION: Cardiac enlargement with pulmonary vascular congestion and mild interstitial edema similar to prior study. Electronically Signed   By: Lucienne Capers M.D.   On: 10/04/2015 23:29     Scheduled Meds: . sodium chloride   Intravenous Once  . amLODipine  10 mg Oral QHS  . aspirin EC  81 mg Oral Daily  . atorvastatin  40 mg Oral Daily  . cloNIDine  0.2 mg Oral BID  . febuxostat  40 mg Oral Daily  . metoprolol succinate  50 mg Oral BID  . multivitamin with minerals  1 tablet Oral Daily  . sevelamer carbonate  2,400 mg Oral TID WC  . sodium chloride flush  3 mL Intravenous Q12H   Continuous Infusions: . heparin 1,950 Units/hr (10/06/15 1134)     LOS: 1 day    Time spent: 30 min    Janece Canterbury, MD [facility] Pager [phone]  If 7PM-7AM, please contact night-coverage [email or web address] Password [email or web address] 10/06/2015, 4:02 PM

## 2015-10-06 NOTE — Consult Note (Signed)
[facility] Renal Consultation Note  Requesting MD: Hal Hope  Indication for Consultation: Hyperkalemia  HPI:  60 year old female presenting with 10 days of progressive dyspnea. PMH is significant for CKD, HTN, T2DM, and seizure disorder. Reports improved shortness of breath.  Denies any other concerns.   CREATININE, SER  Date/Time Value Ref Range Status  10/06/2015 08:02 AM 13.34* 0.44 - 1.00 mg/dL Final  10/05/2015 02:30 PM 15.20* 0.44 - 1.00 mg/dL Final  10/05/2015 10:30 AM 18.70* 0.44 - 1.00 mg/dL Final  10/05/2015 08:19 AM 18.78* 0.44 - 1.00 mg/dL Final  10/05/2015 01:35 AM 18.22* 0.44 - 1.00 mg/dL Final   PMHx:   Past Medical History  Diagnosis Date  . Hypertension   . Kidney disease   . High cholesterol   . Shortness of breath dyspnea     with exertion  . H/O blood clots     many years ago in leg  . Sleep apnea     uses c-pap most of time  . Pneumonia   . Diabetes mellitus without complication ([place])     type 2  . Seizures ([place])     had one or two (more than 10-15 years ago)  . Arthritis   . Constipation     Past Surgical History  Procedure Laterality Date  . Abdominal hysterectomy    . Colonoscopy    . Av fistula placement Left 07/25/2015    Procedure: ARTERIOVENOUS (AV) FISTULA CREATION -LEFT BRACHIOCEPHALIC;  Surgeon: Conrad Borden, MD;  Location: [facility] [place] OR;  Service: Vascular;  Laterality: Left;    Family Hx:  Family History  Problem Relation Age of Onset  . Cancer Mother   . Hypertension Father   . Heart attack Sister   . Heart attack Paternal Aunt   . Stroke Neg Hx     Social History:  reports that she has never smoked. She has never used smokeless tobacco. She reports that she does not drink alcohol or use illicit drugs.  Allergies: No Known Allergies  Medications: Prior to Admission medications   Medication Sig Start Date End Date Taking? Authorizing Provider  amLODipine (NORVASC) 10 MG tablet Take 10 mg by mouth at bedtime.     Yes Historical Provider, MD  aspirin 81 MG tablet Take 81 mg by mouth daily.   Yes Historical Provider, MD  atorvastatin (LIPITOR) 40 MG tablet Take 40 mg by mouth daily.   Yes Historical Provider, MD  Cholecalciferol (VITAMIN D PO) Take 1 tablet by mouth daily.   Yes Historical Provider, MD  cloNIDine (CATAPRES) 0.3 MG tablet Take 0.3 mg by mouth 2 (two) times daily.   Yes Historical Provider, MD  febuxostat (ULORIC) 40 MG tablet Take 40 mg by mouth daily.   Yes Historical Provider, MD  furosemide (LASIX) 80 MG tablet Take 80 mg by mouth daily.   Yes Historical Provider, MD  Liraglutide (VICTOZA) 18 MG/3ML SOPN Inject 1.8 mg into the skin daily.    Yes Historical Provider, MD  metoprolol succinate (TOPROL-XL) 100 MG 24 hr tablet Take 100 mg by mouth 2 (two) times daily. Take with or immediately following a meal.   Yes Historical Provider, MD  Multiple Vitamins-Minerals (MULTIVITAMINS THER. W/MINERALS) TABS tablet Take 1 tablet by mouth daily.   Yes Historical Provider, MD  sacubitril-valsartan (ENTRESTO) 24-26 MG Take 1 tablet by mouth 2 (two) times daily.   Yes Historical Provider, MD    I have reviewed the patient's current medications.  Labs:  Results for  orders placed or performed during the hospital encounter of 10/04/15 (from the past 48 hour(s))  Brain natriuretic peptide     Status: Abnormal   Collection Time: 10/04/15 11:19 PM  Result Value Ref Range   B Natriuretic Peptide 464.4 (H) 0.0 - 100.0 pg/mL  CBC with Differential/Platelet     Status: Abnormal   Collection Time: 10/05/15  1:00 AM  Result Value Ref Range   WBC 11.3 (H) 4.0 - 10.5 K/uL   RBC 2.98 (L) 3.87 - 5.11 MIL/uL   Hemoglobin 8.0 (L) 12.0 - 15.0 g/dL   HCT 24.1 (L) 36.0 - 46.0 %   MCV 80.9 78.0 - 100.0 fL   MCH 26.8 26.0 - 34.0 pg   MCHC 33.2 30.0 - 36.0 g/dL   RDW 15.3 11.5 - 15.5 %   Platelets 247 150 - 400 K/uL   Neutrophils Relative % 73 %   Neutro Abs 8.2 (H) 1.7 - 7.7 K/uL   Lymphocytes Relative 15 %    Lymphs Abs 1.8 0.7 - 4.0 K/uL   Monocytes Relative 11 %   Monocytes Absolute 1.3 (H) 0.1 - 1.0 K/uL   Eosinophils Relative 1 %   Eosinophils Absolute 0.1 0.0 - 0.7 K/uL   Basophils Relative 0 %   Basophils Absolute 0.0 0.0 - 0.1 K/uL  Basic metabolic panel     Status: Abnormal   Collection Time: 10/05/15  1:35 AM  Result Value Ref Range   Sodium 149 (H) 135 - 145 mmol/L   Potassium 7.1 (HH) 3.5 - 5.1 mmol/L    Comment: CRITICAL RESULT CALLED TO, READ BACK BY AND VERIFIED WITH: NELSON,T RN [phone] COVINGTON,N    Chloride 117 (H) 101 - 111 mmol/L   CO2 13 (L) 22 - 32 mmol/L   Glucose, Bld 106 (H) 65 - 99 mg/dL   BUN 150 (H) 6 - 20 mg/dL    Comment: RESULTS CONFIRMED BY MANUAL DILUTION   Creatinine, Ser 18.22 (H) 0.44 - 1.00 mg/dL   Calcium 8.0 (L) 8.9 - 10.3 mg/dL   GFR calc non Af Amer 2 (L) >60 mL/min   GFR calc Af Amer 2 (L) >60 mL/min    Comment: (NOTE) The eGFR has been calculated using the CKD EPI equation. This calculation has not been validated in all clinical situations. eGFR's persistently <60 mL/min signify possible Chronic Kidney Disease.    Anion gap 19 (H) 5 - 15  Troponin I     Status: None   Collection Time: 10/05/15  1:35 AM  Result Value Ref Range   Troponin I 0.03 <0.031 ng/mL    Comment:        NO INDICATION OF MYOCARDIAL INJURY.   CBG monitoring, ED     Status: Abnormal   Collection Time: 10/05/15  6:18 AM  Result Value Ref Range   Glucose-Capillary 62 (L) 65 - 99 mg/dL  MRSA PCR Screening     Status: None   Collection Time: 10/05/15  7:02 AM  Result Value Ref Range   MRSA by PCR NEGATIVE NEGATIVE    Comment:        The GeneXpert MRSA Assay (FDA approved for NASAL specimens only), is one component of a comprehensive MRSA colonization surveillance program. It is not intended to diagnose MRSA infection nor to guide or monitor treatment for MRSA infections.   Glucose, capillary     Status: None   Collection Time: 10/05/15  7:05 AM   Result Value Ref Range   Glucose-Capillary  82 65 - 99 mg/dL  Comprehensive metabolic panel     Status: Abnormal   Collection Time: 10/05/15  8:19 AM  Result Value Ref Range   Sodium 149 (H) 135 - 145 mmol/L   Potassium 6.4 (HH) 3.5 - 5.1 mmol/L    Comment: SLIGHT HEMOLYSIS CRITICAL RESULT CALLED TO, READ BACK BY AND VERIFIED WITH: JOSH DRAPER,RN AT 1006 10/05/15 BY ZBEECH.    Chloride 117 (H) 101 - 111 mmol/L   CO2 14 (L) 22 - 32 mmol/L   Glucose, Bld 95 65 - 99 mg/dL   BUN 159 (H) 6 - 20 mg/dL   Creatinine, Ser 18.78 (H) 0.44 - 1.00 mg/dL   Calcium 8.3 (L) 8.9 - 10.3 mg/dL   Total Protein 6.3 (L) 6.5 - 8.1 g/dL   Albumin 2.0 (L) 3.5 - 5.0 g/dL   AST 21 15 - 41 U/L   ALT 16 14 - 54 U/L   Alkaline Phosphatase 84 38 - 126 U/L   Total Bilirubin 0.6 0.3 - 1.2 mg/dL   GFR calc non Af Amer 2 (L) >60 mL/min   GFR calc Af Amer 2 (L) >60 mL/min    Comment: (NOTE) The eGFR has been calculated using the CKD EPI equation. This calculation has not been validated in all clinical situations. eGFR's persistently <60 mL/min signify possible Chronic Kidney Disease.    Anion gap 18 (H) 5 - 15  Phosphorus     Status: Abnormal   Collection Time: 10/05/15  8:19 AM  Result Value Ref Range   Phosphorus 12.3 (H) 2.5 - 4.6 mg/dL    Comment: RESULTS CONFIRMED BY MANUAL DILUTION  CBC with Differential/Platelet     Status: Abnormal   Collection Time: 10/05/15  8:19 AM  Result Value Ref Range   WBC 11.0 (H) 4.0 - 10.5 K/uL   RBC 2.93 (L) 3.87 - 5.11 MIL/uL   Hemoglobin 7.7 (L) 12.0 - 15.0 g/dL   HCT 23.9 (L) 36.0 - 46.0 %   MCV 81.6 78.0 - 100.0 fL   MCH 26.3 26.0 - 34.0 pg   MCHC 32.2 30.0 - 36.0 g/dL   RDW 15.2 11.5 - 15.5 %   Platelets 253 150 - 400 K/uL   Neutrophils Relative % 78 %   Neutro Abs 8.6 (H) 1.7 - 7.7 K/uL   Lymphocytes Relative 9 %   Lymphs Abs 0.9 0.7 - 4.0 K/uL   Monocytes Relative 12 %   Monocytes Absolute 1.3 (H) 0.1 - 1.0 K/uL   Eosinophils Relative 1 %   Eosinophils  Absolute 0.1 0.0 - 0.7 K/uL   Basophils Relative 0 %   Basophils Absolute 0.0 0.0 - 0.1 K/uL  Troponin I     Status: Abnormal   Collection Time: 10/05/15  8:19 AM  Result Value Ref Range   Troponin I 0.11 (H) <0.031 ng/mL    Comment:        PERSISTENTLY INCREASED TROPONIN VALUES IN THE RANGE OF 0.04-0.49 ng/mL CAN BE SEEN IN:       -UNSTABLE ANGINA       -CONGESTIVE HEART FAILURE       -MYOCARDITIS       -CHEST TRAUMA       -ARRYHTHMIAS       -LATE PRESENTING MYOCARDIAL INFARCTION       -COPD   CLINICAL FOLLOW-UP RECOMMENDED.   Type and screen [place]     Status: None   Collection Time: 10/05/15  8:19 AM  Result Value Ref Range   ABO/RH(D) B POS    Antibody Screen NEG    Sample Expiration 10/08/2015   ABO/Rh     Status: None   Collection Time: 10/05/15  8:19 AM  Result Value Ref Range   ABO/RH(D) B POS   Renal function panel     Status: Abnormal   Collection Time: 10/05/15 10:30 AM  Result Value Ref Range   Sodium 149 (H) 135 - 145 mmol/L   Potassium 6.3 (HH) 3.5 - 5.1 mmol/L    Comment: NO VISIBLE HEMOLYSIS CRITICAL RESULT CALLED TO, READ BACK BY AND VERIFIED WITH: Theressa Millard AT 1121 10/05/15 BY ZBEECH.    Chloride 117 (H) 101 - 111 mmol/L   CO2 15 (L) 22 - 32 mmol/L   Glucose, Bld 93 65 - 99 mg/dL   BUN 161 (H) 6 - 20 mg/dL   Creatinine, Ser 18.70 (H) 0.44 - 1.00 mg/dL   Calcium 8.1 (L) 8.9 - 10.3 mg/dL   Phosphorus 12.3 (H) 2.5 - 4.6 mg/dL    Comment: RESULTS CONFIRMED BY MANUAL DILUTION   Albumin 1.8 (L) 3.5 - 5.0 g/dL   GFR calc non Af Amer 2 (L) >60 mL/min   GFR calc Af Amer 2 (L) >60 mL/min    Comment: (NOTE) The eGFR has been calculated using the CKD EPI equation. This calculation has not been validated in all clinical situations. eGFR's persistently <60 mL/min signify possible Chronic Kidney Disease.    Anion gap 17 (H) 5 - 15  Hepatitis B surface antigen     Status: None   Collection Time: 10/05/15 10:30 AM  Result Value  Ref Range   Hepatitis B Surface Ag Negative Negative    Comment: (NOTE) Performed At: [facility] [place], [place] [id number] Lindon Romp MD [id number]   Hepatitis B surface antibody     Status: None   Collection Time: 10/05/15 10:30 AM  Result Value Ref Range   Hep B S Ab Non Reactive     Comment: (NOTE)              Non Reactive: Inconsistent with immunity,                            less than 10 mIU/mL              Reactive:     Consistent with immunity,                            greater than 9.9 mIU/mL Performed At: [facility] [place], [place] [id number] Lindon Romp MD [id number]   Hepatitis B core antibody, total     Status: None   Collection Time: 10/05/15 10:30 AM  Result Value Ref Range   Hep B Core Total Ab Negative Negative    Comment: (NOTE) Performed At: [facility] [place], [place] [id number] Lindon Romp MD [id number]   ALT     Status: None   Collection Time: 10/05/15 10:30 AM  Result Value Ref Range   ALT 14 14 - 54 U/L  I-STAT, chem 8     Status: Abnormal   Collection Time: 10/05/15  2:30 PM  Result Value Ref Range   Sodium 147 (H) 135 - 145 mmol/L   Potassium 4.6 3.5 - 5.1 mmol/L   Chloride 113 (H) 101 -  111 mmol/L   BUN 120 (H) 6 - 20 mg/dL   Creatinine, Ser 15.20 (H) 0.44 - 1.00 mg/dL   Glucose, Bld 88 65 - 99 mg/dL   Calcium, Ion 1.07 (L) 1.12 - 1.23 mmol/L   TCO2 20 0 - 100 mmol/L   Hemoglobin 8.5 (L) 12.0 - 15.0 g/dL   HCT 25.0 (L) 36.0 - 46.0 %  Glucose, capillary     Status: None   Collection Time: 10/05/15  6:21 PM  Result Value Ref Range   Glucose-Capillary 76 65 - 99 mg/dL  Glucose, capillary     Status: None   Collection Time: 10/05/15 11:46 PM  Result Value Ref Range   Glucose-Capillary 72 65 - 99 mg/dL  Glucose, capillary     Status: None   Collection Time: 10/06/15  3:50 AM  Result Value Ref Range   Glucose-Capillary 81 65 - 99 mg/dL   Glucose, capillary     Status: Abnormal   Collection Time: 10/06/15  7:56 AM  Result Value Ref Range   Glucose-Capillary 64 (L) 65 - 99 mg/dL  Renal function panel     Status: Abnormal   Collection Time: 10/06/15  8:02 AM  Result Value Ref Range   Sodium 146 (H) 135 - 145 mmol/L   Potassium 5.1 3.5 - 5.1 mmol/L   Chloride 110 101 - 111 mmol/L   CO2 17 (L) 22 - 32 mmol/L   Glucose, Bld 72 65 - 99 mg/dL   BUN 103 (H) 6 - 20 mg/dL   Creatinine, Ser 13.34 (H) 0.44 - 1.00 mg/dL   Calcium 7.9 (L) 8.9 - 10.3 mg/dL   Phosphorus 9.3 (H) 2.5 - 4.6 mg/dL   Albumin 1.7 (L) 3.5 - 5.0 g/dL   GFR calc non Af Amer 3 (L) >60 mL/min   GFR calc Af Amer 3 (L) >60 mL/min    Comment: (NOTE) The eGFR has been calculated using the CKD EPI equation. This calculation has not been validated in all clinical situations. eGFR's persistently <60 mL/min signify possible Chronic Kidney Disease.    Anion gap 19 (H) 5 - 15  Glucose, capillary     Status: None   Collection Time: 10/06/15  8:46 AM  Result Value Ref Range   Glucose-Capillary 98 65 - 99 mg/dL  Troponin I (q 6hr x 3)     Status: Abnormal   Collection Time: 10/06/15  9:30 AM  Result Value Ref Range   Troponin I 11.23 (HH) <0.031 ng/mL    Comment:        POSSIBLE MYOCARDIAL ISCHEMIA. SERIAL TESTING RECOMMENDED. CRITICAL RESULT CALLED TO, READ BACK BY AND VERIFIED WITH: N.WALLER,RN 10/06/15 '@1004'  BY V.WILKINS    ROS: Pertinent items are noted in HPI.  Physical Exam: Filed Vitals:   10/06/15 0800 10/06/15 0813  BP:  163/74  Pulse:  109  Temp: 98.8 F (37.1 C) 99.2 F (37.3 C)  Resp:  19     General: 60 year old female, lying in hospital bed HEENT: MMM Heart: Distant heart sounds. RRR, no murmurs noted. Lungs: Limited secondary to patient's habitus. Distant breath sounds, but air movement noted bilaterally.  Abdomen: Obese, S, NT, ND Extremities: 2+ lower extremity edema noted with chronic venous stasis changes Skin: Warm,  dry Neuro: alert, no focal deficits.   Assessment/Plan: 1. Acute on Chronic Renal Failure. Volume overloaded on admission. Initial Cr 18.22, improved to 13.34. Bicarb 17, phosphorus 9.3. Temporary catheter placed this morning. Received HD on 5/6, next scheduled  for today. Renvela for elevated Ph. PT recommended. 2. Hyperkalemia. K 7.1 on arrival, now down to 5.1 s/p kayexalate, lasix, and insulin. On entresto as outpatient.  HD as above.  3. Normocytic Anemia. Hg 8.5. Monitor CBC. 4. Gout. On uloric.  5. HTN. On metoprolol, clonidine, and amlodipine 6. T2DM. On SSI.  7. HLD. On statin and aspirin per primary.   [facility] 10/06/2015, 12:21 PM   Renal Attending: Had HD yesterday with -4000cc fluid out.  Feels better.  Will do HD again today.  AVF remains a problem and will see if AVF more prominent with fluid removal.  Will need to mobilize pt so OP HD can be feasible. Jawanda Passey C

## 2015-10-07 ENCOUNTER — Inpatient Hospital Stay (HOSPITAL_COMMUNITY): Payer: Medicare Other

## 2015-10-07 DIAGNOSIS — R195 Other fecal abnormalities: Secondary | ICD-10-CM | POA: Diagnosis present

## 2015-10-07 DIAGNOSIS — I360 Nonrheumatic tricuspid (valve) stenosis: Secondary | ICD-10-CM

## 2015-10-07 DIAGNOSIS — D649 Anemia, unspecified: Secondary | ICD-10-CM | POA: Diagnosis present

## 2015-10-07 DIAGNOSIS — E162 Hypoglycemia, unspecified: Secondary | ICD-10-CM | POA: Diagnosis present

## 2015-10-07 LAB — IRON AND TIBC
IRON: 30 ug/dL (ref 28–170)
SATURATION RATIOS: 26 % (ref 10.4–31.8)
TIBC: 118 ug/dL — ABNORMAL LOW (ref 250–450)
UIBC: 88 ug/dL

## 2015-10-07 LAB — RENAL FUNCTION PANEL
ALBUMIN: 1.5 g/dL — AB (ref 3.5–5.0)
ANION GAP: 16 — AB (ref 5–15)
BUN: 55 mg/dL — ABNORMAL HIGH (ref 6–20)
CALCIUM: 7.9 mg/dL — AB (ref 8.9–10.3)
CO2: 22 mmol/L (ref 22–32)
Chloride: 104 mmol/L (ref 101–111)
Creatinine, Ser: 9.33 mg/dL — ABNORMAL HIGH (ref 0.44–1.00)
GFR calc Af Amer: 5 mL/min — ABNORMAL LOW (ref >60)
GFR, EST NON AFRICAN AMERICAN: 4 mL/min — AB (ref >60)
Glucose, Bld: 88 mg/dL (ref 65–99)
PHOSPHORUS: 7.2 mg/dL — AB (ref 2.5–4.6)
POTASSIUM: 4.2 mmol/L (ref 3.5–5.1)
SODIUM: 142 mmol/L (ref 135–145)

## 2015-10-07 LAB — ECHOCARDIOGRAM COMPLETE
HEIGHTINCHES: 66 in
WEIGHTICAEL: 6349.25 [oz_av]

## 2015-10-07 LAB — CBC WITH DIFFERENTIAL/PLATELET
BASOS ABS: 0 10*3/uL (ref 0.0–0.1)
BASOS PCT: 0 %
EOS ABS: 0.2 10*3/uL (ref 0.0–0.7)
Eosinophils Relative: 2 %
HEMATOCRIT: 23.1 % — AB (ref 36.0–46.0)
Hemoglobin: 7.6 g/dL — ABNORMAL LOW (ref 12.0–15.0)
Lymphocytes Relative: 15 %
Lymphs Abs: 1.6 10*3/uL (ref 0.7–4.0)
MCH: 26.7 pg (ref 26.0–34.0)
MCHC: 32.9 g/dL (ref 30.0–36.0)
MCV: 81.1 fL (ref 78.0–100.0)
MONO ABS: 0.9 10*3/uL (ref 0.1–1.0)
MONOS PCT: 9 %
NEUTROS ABS: 7.7 10*3/uL (ref 1.7–7.7)
Neutrophils Relative %: 74 %
PLATELETS: 189 10*3/uL (ref 150–400)
RBC: 2.85 MIL/uL — ABNORMAL LOW (ref 3.87–5.11)
RDW: 14.6 % (ref 11.5–15.5)
WBC: 10.4 10*3/uL (ref 4.0–10.5)

## 2015-10-07 LAB — GLUCOSE, CAPILLARY
GLUCOSE-CAPILLARY: 88 mg/dL (ref 65–99)
GLUCOSE-CAPILLARY: 81 mg/dL (ref 65–99)
GLUCOSE-CAPILLARY: 91 mg/dL (ref 65–99)
GLUCOSE-CAPILLARY: 95 mg/dL (ref 65–99)
GLUCOSE-CAPILLARY: 95 mg/dL (ref 65–99)
Glucose-Capillary: 94 mg/dL (ref 65–99)

## 2015-10-07 LAB — TSH: TSH: 1.541 u[IU]/mL (ref 0.350–4.500)

## 2015-10-07 LAB — FERRITIN: Ferritin: 47 ng/mL (ref 11–307)

## 2015-10-07 LAB — FOLATE: FOLATE: 21.1 ng/mL (ref >5.9)

## 2015-10-07 LAB — VITAMIN B12: Vitamin B-12: 1308 pg/mL — ABNORMAL HIGH (ref 180–914)

## 2015-10-07 LAB — HEPARIN LEVEL (UNFRACTIONATED)
HEPARIN UNFRACTIONATED: 0.63 [IU]/mL (ref 0.30–0.70)
Heparin Unfractionated: 0.51 IU/mL (ref 0.30–0.70)

## 2015-10-07 MED ORDER — PERFLUTREN LIPID MICROSPHERE
INTRAVENOUS | Status: AC
  Administered 2015-10-07: 2 mL
  Filled 2015-10-07: qty 10

## 2015-10-07 MED ORDER — PERFLUTREN LIPID MICROSPHERE
1.0000 mL | INTRAVENOUS | Status: AC | PRN
  Filled 2015-10-07: qty 10

## 2015-10-07 MED ORDER — GUAIFENESIN-CODEINE 100-10 MG/5ML PO SOLN
5.0000 mL | ORAL | Status: DC | PRN
  Administered 2015-10-07 – 2015-10-08 (×4): 5 mL via ORAL
  Filled 2015-10-07 (×5): qty 5

## 2015-10-07 NOTE — Progress Notes (Addendum)
PROGRESS NOTE  [name]  [id number] DOB: 06-15-1955 DOA: 10/04/2015 PCP: Dwan Bolt, MD Outpatient Specialists:  Dr. Florene Glen, Nephrology  Brief Narrative:   [name] is a 60 y.o. 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 Patient has been having gradually worsening shortness of breath over the last 10 days. Denies any chest pain or productive cough fever chills. Patient states she has gained a lot of weight with peripheral edema. In the ER chest x-ray shows congestion and labs reviewed markedly elevated creatinine and BUN with no old labs to compare. Potassium is also elevated at 7.1 with no EKG changes. Patient also has metabolic acidosis. Patient was given calcium gluconate Lasix insulin and dextrose and Kayexalate.  The patient was transferred to [facility] for hemodialysis.  Assessment & Plan:   Principal Problem:   Acute pulmonary edema (HCC) Active Problems:   Morbid obesity ([place])   Renal failure (ARF), acute on chronic (HCC)   Chronic anemia   Type 2 diabetes mellitus with renal complication (HCC)   Hypertension   Elevated troponin   Acute pulmonary edema, secondary to acute renal failure, improving -  Had 4.3L removed by HD yesterday -  Patient unsure if she is making urine  Hyperkalemia, 7.1 at admission, secondary to acute renal failure, resolved with Lasix, Kayexalate, dextrose and insulin, and hemodialysis   Elevated troponin, possible NSTEMI, troponin 11 on 5/6, but rapidly trended back down -  heparin gtt started on 5/6, continue for 48 h.  Will hold if she has overt rectal bleeding -  Continue beta blocker and high dose statin -  Continue daily aspirin -  ECG:  Unchanged, no acute ST segment changes or T-wave inversions -  Continue prn nitroglycerin -  Cardiology assistance appreciated  -  ECHO:  Severe  LVH, normal systolic function, ejection fraction 55-60 percent. Mild aortic valve stenosis, moderately dilated left atrium. PA peak pressure 37 mmHg.  No regional wall motion abnormalities  Somnolence -  Check ammonia level -  Minimize sedating medications > not on any currently -  Continue to monitor for hypoglycemia  Hypernatremia, metabolic acidosis and hyperphosphatemia all secondary to acute on chronic renal failure. Improving with HD -  HD per nephrology -  Check PTH   Acute on chronic kidney disease, acute renal failure  -  Left arm fistula was placed 07/25/2015 and is likely not mature enough to for use for hemodialysis  -  Right IJ temporary HD catheter placed by PCCM on 10/05/2015 -  Nephrology assistance appreciated -  Started HD on 10/05/2015, status post 2 sessions   Diabetes mellitus type 2, recurrent hypoglycemia -  A1c pending -  Not on insulin or oral diabetes medications.  No fevers to suggest sepsis.   -  Check cortisol level -  TSH wnl  Essential hypertension, blood pressures elevated.  Anticipate that her blood pressures may trending down with hemodialysis -  Clonidine decreased to 0.2mg  BID -  Continue amlodipine 10 mg, metoprolol 100 mg by mouth twice a day -  Continue hydralazine when necessary  Gout, stable, continue uloric  Normocytic anemia, likely due to renal failure.  Iron studies, folate, and TSH were within normal limits.  Vitamin B-12 level elevated.  Occult stool positive, but no gross blood in stools.  Will need GI follow up.    DVT prophylaxis:  heparin Code  Status:  Full code Family Communication:  Patient alone Disposition Plan:  Transfer to 6E once off heparin gtt and hemoglobin proven stable   Consultants:   Nephrology  Cardiology  Procedures:  Temporary HD catheter placed by PCCM on 10/05/2015 Hemodialysis initiated on 10/05/2015  Antimicrobials:   none   Subjective: Patient states that she feels much better. Her breathing is  easier and she feels less swollen in her hands and legs.  Does not know when she voids and was unable to tell me when her last stool was.    Objective: Filed Vitals:   10/07/15 0500 10/07/15 0600 10/07/15 0810 10/07/15 1255  BP:   155/86 173/81  Pulse: 95 96 92 91  Temp:   99.1 F (37.3 C) 98.5 F (36.9 C)  TempSrc:   Oral Oral  Resp: 16 16 20 17   Height:      Weight:      SpO2: 99% 100% 100% 99%    Intake/Output Summary (Last 24 hours) at 10/07/15 1424 Last data filed at 10/07/15 1059  Gross per 24 hour  Intake      3 ml  Output   4300 ml  Net  -4297 ml   Filed Weights   10/06/15 1500 10/06/15 1911 10/07/15 0443  Weight: 185.521 kg (409 lb) 180 kg (396 lb 13.3 oz) 180 kg (396 lb 13.3 oz)    Examination:  General exam:  Adult Female. NAD, still somewhat sleepy HEENT:  NCAT, MMM,  Respiratory system:  Faint wheeze, no rales or rhonchi Cardiovascular system: S1 & S2 heard, RRR. No JVD, murmurs, rubs, gallops or clicks.  Warm extremities Gastrointestinal system: Abdomen is nondistended, soft and nontender. No organomegaly or masses felt. Normal bowel sounds heard. Skin: No rashes, lesions or ulcers. Hands no longer edematous.  Legs with only trace edema MSK:  Normal tone and bulk, obese.  Left arm fistula feels small but has good bruit and thrill. Neuro:  Cranial nerves II through XII grossly intact, no obvious focal neurologic deficits  Data Reviewed: I have personally reviewed following labs and imaging studies  CBC:  Recent Labs Lab 10/05/15 0100 10/05/15 0819 10/05/15 1430 10/06/15 1438 10/07/15 0620  WBC 11.3* 11.0*  --  12.6* 10.4  NEUTROABS 8.2* 8.6*  --   --  7.7  HGB 8.0* 7.7* 8.5* 6.9* 7.6*  HCT 24.1* 23.9* 25.0* 21.1* 23.1*  MCV 80.9 81.6  --  81.2 81.1  PLT 247 253  --  222 [id number]   Basic Metabolic Panel:  Recent Labs Lab 10/05/15 0819 10/05/15 1030 10/05/15 1430 10/06/15 0802 10/06/15 1438 10/07/15 0355  NA 149* 149* 147* 146* 145 142  K  6.4* 6.3* 4.6 5.1 5.0 4.2  CL 117* 117* 113* 110 109 104  CO2 14* 15*  --  17* 18* 22  GLUCOSE 95 93 88 72 87 88  BUN 159* 161* 120* 103* 103* 55*  CREATININE 18.78* 18.70* 15.20* 13.34* 13.57* 9.33*  CALCIUM 8.3* 8.1*  --  7.9* 7.9* 7.9*  PHOS 12.3* 12.3*  --  9.3* 9.4* 7.2*   GFR: Estimated Creatinine Clearance: 11.2 mL/min (by C-G formula based on Cr of 9.33). Liver Function Tests:  Recent Labs Lab 10/05/15 0819 10/05/15 1030 10/06/15 0802 10/06/15 1438 10/07/15 0355  AST 21  --   --   --   --   ALT 16 14  --   --   --   ALKPHOS 84  --   --   --   --  BILITOT 0.6  --   --   --   --   PROT 6.3*  --   --   --   --   ALBUMIN 2.0* 1.8* 1.7* 1.6* 1.5*   No results for input(s): LIPASE, AMYLASE in the last 168 hours. No results for input(s): AMMONIA in the last 168 hours. Coagulation Profile: No results for input(s): INR, PROTIME in the last 168 hours. Cardiac Enzymes:  Recent Labs Lab 10/05/15 0135 10/05/15 0819 10/06/15 0930 10/06/15 1438 10/06/15 2130  TROPONINI 0.03 0.11* 11.23* 0.06* 0.06*   BNP (last 3 results) No results for input(s): PROBNP in the last 8760 hours. HbA1C: No results for input(s): HGBA1C in the last 72 hours. CBG:  Recent Labs Lab 10/06/15 2248 10/07/15 0122 10/07/15 0352 10/07/15 0808 10/07/15 1254  GLUCAP 96 88 81 91 95   Lipid Profile: No results for input(s): CHOL, HDL, LDLCALC, TRIG, CHOLHDL, LDLDIRECT in the last 72 hours. Thyroid Function Tests:  Recent Labs  10/07/15 0355  TSH 1.541   Anemia Panel:  Recent Labs  10/07/15 0355  VITAMINB12 1308*  FOLATE 21.1  FERRITIN 47  TIBC 118*  IRON 30   Urine analysis: No results found for: COLORURINE, APPEARANCEUR, LABSPEC, PHURINE, GLUCOSEU, HGBUR, BILIRUBINUR, KETONESUR, PROTEINUR, UROBILINOGEN, NITRITE, LEUKOCYTESUR Sepsis Labs: @LABRCNTIP (procalcitonin:4,lacticidven:4)  ) Recent Results (from the past 240 hour(s))  MRSA PCR Screening     Status: None    Collection Time: 10/05/15  7:02 AM  Result Value Ref Range Status   MRSA by PCR NEGATIVE NEGATIVE Final    Comment:        The GeneXpert MRSA Assay (FDA approved for NASAL specimens only), is one component of a comprehensive MRSA colonization surveillance program. It is not intended to diagnose MRSA infection nor to guide or monitor treatment for MRSA infections.       Radiology Studies: No results found.   Scheduled Meds: . sodium chloride   Intravenous Once  . amLODipine  10 mg Oral QHS  . aspirin EC  81 mg Oral Daily  . atorvastatin  40 mg Oral Daily  . cloNIDine  0.2 mg Oral BID  . febuxostat  40 mg Oral Daily  . metoprolol succinate  50 mg Oral BID  . multivitamin with minerals  1 tablet Oral Daily  . sevelamer carbonate  2,400 mg Oral TID WC  . sodium chloride flush  3 mL Intravenous Q12H   Continuous Infusions: . heparin 1,850 Units/hr (10/07/15 1251)     LOS: 2 days    Time spent: 30 min    Janece Canterbury, MD [facility] Pager [phone]  If 7PM-7AM, please contact night-coverage [email or web address] Password [email or web address] 10/07/2015, 2:24 PM

## 2015-10-07 NOTE — Progress Notes (Signed)
ANTICOAGULATION CONSULT NOTE  Pharmacy Consult for Heparin Indication: chest pain/ACS  No Known Allergies  Patient Measurements: Height: 5\' 6"  (167.6 cm) Weight: (!) 396 lb 13.3 oz (180 kg) IBW/kg (Calculated) : 59.3 Heparin Dosing Weight: 108.3kg  Vital Signs: Temp: 97.4 F (36.3 C) (05/06 2024) Temp Source: Oral (05/06 2024) BP: 166/81 mmHg (05/06 2200) Pulse Rate: 92 (05/06 2024)  Labs:  Recent Labs  10/05/15 0100  10/05/15 0819  10/05/15 1430 10/06/15 0802 10/06/15 0930 10/06/15 1438 10/06/15 1926 10/06/15 2130 10/07/15 0355  HGB 8.0*  --  7.7*  --  8.5*  --   --  6.9*  --   --   --   HCT 24.1*  --  23.9*  --  25.0*  --   --  21.1*  --   --   --   PLT 247  --  253  --   --   --   --  222  --   --   --   HEPARINUNFRC  --   --   --   --   --   --   --   --  0.74*  --  0.51  CREATININE  --   < > 18.78*  < > 15.20* 13.34*  --  13.57*  --   --  9.33*  TROPONINI  --   < > 0.11*  --   --   --  11.23* 0.06*  --  0.06*  --   < > = values in this interval not displayed.  Estimated Creatinine Clearance: 11.2 mL/min (by C-G formula based on Cr of 9.33).  Medical History: Past Medical History  Diagnosis Date  . Hypertension   . Kidney disease   . High cholesterol   . Shortness of breath dyspnea     with exertion  . H/O blood clots     many years ago in leg  . Sleep apnea     uses c-pap most of time  . Pneumonia   . Diabetes mellitus without complication ([place])     type 2  . Seizures ([place])     had one or two (more than 10-15 years ago)  . Arthritis   . Constipation    Assessment: Pt. presents with complaints of SOB, found to have an elevated troponin (11.23).  Pharmacy consulted to start IV heparin for ACS.  She has a h/o blood clots in her leg from many years ago but is not on any current anticoagulation. Lab results indicate a low H/H of 8.5/25 but no acute bleeding noted.   HL this am is 0.51 after rate decrease to 1850 units/hr. Hgb 6.9 < 8.5 < 7.7 over the  last 36 hours. No issues with infusion or evidence of bleeding. No CBC ordered for this am.  Goal of Therapy:  Heparin level 0.3-0.7 units/ml Monitor platelets by anticoagulation protocol: Yes   Plan:  1. Continue heparin IV infusion at 1850 units/h 2. Obtain 8 hour confirmatory heparin level 3. Add on CBC to labs that have already been drawn and f/u hgb trend  4. Daily heparin level/CBC 5. Monitor s/sx bleeding  Vincenza Hews, PharmD, BCPS 10/07/2015, 4:38 AM Pager: [phone]

## 2015-10-07 NOTE — Progress Notes (Signed)
ANTICOAGULATION CONSULT NOTE  Pharmacy Consult for Heparin Indication: chest pain/ACS  No Known Allergies  Patient Measurements: Height: 5\' 6"  (167.6 cm) Weight: (!) 396 lb 13.3 oz (180 kg) IBW/kg (Calculated) : 59.3 Heparin Dosing Weight: 108.3kg  Vital Signs: Temp: 98.5 F (36.9 C) (05/07 1255) Temp Source: Oral (05/07 1255) BP: 173/81 mmHg (05/07 1255) Pulse Rate: 91 (05/07 1255)  Labs:  Recent Labs  10/05/15 0819  10/05/15 1430 10/06/15 0802 10/06/15 0930 10/06/15 1438 10/06/15 1926 10/06/15 2130 10/07/15 0355 10/07/15 0620 10/07/15 1522  HGB 7.7*  --  8.5*  --   --  6.9*  --   --   --  7.6*  --   HCT 23.9*  --  25.0*  --   --  21.1*  --   --   --  23.1*  --   PLT 253  --   --   --   --  222  --   --   --  189  --   HEPARINUNFRC  --   --   --   --   --   --  0.74*  --  0.51  --  0.63  CREATININE 18.78*  < > 15.20* 13.34*  --  13.57*  --   --  9.33*  --   --   TROPONINI 0.11*  --   --   --  11.23* 0.06*  --  0.06*  --   --   --   < > = values in this interval not displayed.  Estimated Creatinine Clearance: 11.2 mL/min (by C-G formula based on Cr of 9.33).  Medical History: Past Medical History  Diagnosis Date  . Hypertension   . Kidney disease   . High cholesterol   . Shortness of breath dyspnea     with exertion  . H/O blood clots     many years ago in leg  . Sleep apnea     uses c-pap most of time  . Pneumonia   . Diabetes mellitus without complication ([place])     type 2  . Seizures ([place])     had one or two (more than 10-15 years ago)  . Arthritis   . Constipation    Assessment: Pt. presents with complaints of SOB, found to have an elevated troponin (11.23).  Pharmacy consulted to start IV heparin for ACS.  She has a h/o blood clots in her leg from many years ago but is not on any current anticoagulation.  HL therapeutic x2 (0.63) on 1850 units/hr. Hgb 7.6 stable, plt wnl. No bleed documented.  Goal of Therapy:  Heparin level 0.3-0.7  units/ml Monitor platelets by anticoagulation protocol: Yes   Plan:  1. Continue heparin IV infusion at 1850 units/h 2. Daily heparin level/CBC 3. Monitor s/sx bleeding  Elicia Lamp, PharmD, [facility] Clinical Pharmacist Pager [phone] 10/07/2015 4:21 PM

## 2015-10-07 NOTE — Progress Notes (Signed)
Patient ID: [name], female   DOB: April 15, 1956, 60 y.o.   MRN: [id number]    Patient Name: [name] Date of Encounter: 10/07/2015     Principal Problem:   Acute pulmonary edema [facility]) Active Problems:   Morbid obesity ([place])   Renal failure (ARF), acute on chronic (HCC)   Chronic anemia   Type 2 diabetes mellitus with renal complication (HCC)   Hypertension   Elevated troponin    SUBJECTIVE  Denies chest pain or sob. 2D echo being obtained on my visit.   CURRENT MEDS . sodium chloride   Intravenous Once  . amLODipine  10 mg Oral QHS  . aspirin EC  81 mg Oral Daily  . atorvastatin  40 mg Oral Daily  . cloNIDine  0.2 mg Oral BID  . febuxostat  40 mg Oral Daily  . metoprolol succinate  50 mg Oral BID  . multivitamin with minerals  1 tablet Oral Daily  . sevelamer carbonate  2,400 mg Oral TID WC  . sodium chloride flush  3 mL Intravenous Q12H    OBJECTIVE  Filed Vitals:   10/07/15 0443 10/07/15 0500 10/07/15 0600 10/07/15 0810  BP: 155/68   155/86  Pulse: 85 95 96 92  Temp: 98.6 F (37 C)   99.1 F (37.3 C)  TempSrc: Axillary   Oral  Resp: 18 16 16 20   Height:      Weight: 396 lb 13.3 oz (180 kg)     SpO2: 99% 99% 100% 100%    Intake/Output Summary (Last 24 hours) at 10/07/15 0834 Last data filed at 10/06/15 1911  Gross per 24 hour  Intake      3 ml  Output   4300 ml  Net  -4297 ml   Filed Weights   10/06/15 1500 10/06/15 1911 10/07/15 0443  Weight: 409 lb (185.521 kg) 396 lb 13.3 oz (180 kg) 396 lb 13.3 oz (180 kg)    PHYSICAL EXAM  General: Pleasant, NAD. Neuro: Alert and oriented X 3. Moves all extremities spontaneously. Psych: Normal affect. HEENT:  Normal  Neck: Supple without bruits or JVD. Lungs:  Resp regular and unlabored, CTA. Heart: RRR no s3, s4, or murmurs. Abdomen: Soft, massive obesity, non-tender, non-distended, BS + x 4.  Extremities: No clubbing, cyanosis, 1+ edema  Accessory Clinical Findings  CBC  Recent Labs  10/05/15 0819  10/06/15 1438 10/07/15 0620  WBC 11.0*  --  12.6* 10.4  NEUTROABS 8.6*  --   --  7.7  HGB 7.7*  < > 6.9* 7.6*  HCT 23.9*  < > 21.1* 23.1*  MCV 81.6  --  81.2 81.1  PLT 253  --  222 189  < > = values in this interval not displayed. Basic Metabolic Panel  Recent Labs  10/06/15 1438 10/07/15 0355  NA 145 142  K 5.0 4.2  CL 109 104  CO2 18* 22  GLUCOSE 87 88  BUN 103* 55*  CREATININE 13.57* 9.33*  CALCIUM 7.9* 7.9*  PHOS 9.4* 7.2*   Liver Function Tests  Recent Labs  10/05/15 0819 10/05/15 1030  10/06/15 1438 10/07/15 0355  AST 21  --   --   --   --   ALT 16 14  --   --   --   ALKPHOS 84  --   --   --   --   BILITOT 0.6  --   --   --   --   PROT 6.3*  --   --   --   --  ALBUMIN 2.0* 1.8*  < > 1.6* 1.5*  < > = values in this interval not displayed. No results for input(s): LIPASE, AMYLASE in the last 72 hours. Cardiac Enzymes  Recent Labs  10/06/15 0930 10/06/15 1438 10/06/15 2130  TROPONINI 11.23* 0.06* 0.06*   BNP Invalid input(s): POCBNP D-Dimer No results for input(s): DDIMER in the last 72 hours. Hemoglobin A1C No results for input(s): HGBA1C in the last 72 hours. Fasting Lipid Panel No results for input(s): CHOL, HDL, LDLCALC, TRIG, CHOLHDL, LDLDIRECT in the last 72 hours. Thyroid Function Tests  Recent Labs  10/07/15 0355  TSH 1.541    TELE  nsr with no acute STT changes  Radiology/Studies  Dg Chest Port 1 View  10/05/2015  CLINICAL DATA:  Central line placement. EXAM: PORTABLE CHEST 1 VIEW COMPARISON:  10/04/2015 FINDINGS: Right central line placement with the tip in the SVC. No pneumothorax. Heart is mildly enlarged. Mild vascular congestion. Mild interstitial prominence similar prior study. No acute bony abnormality. IMPRESSION: Right central line tip in the SVC. No pneumothorax. Otherwise no real change. Electronically Signed   By: Rolm Baptise M.D.   On: 10/05/2015 12:17   Dg Chest Port 1 View  10/04/2015  CLINICAL  DATA:  Chest pain, shortness of breath, and cough for 1 week, worse tonight. EXAM: PORTABLE CHEST 1 VIEW COMPARISON:  09/28/2015 FINDINGS: Shallow inspiration. Cardiac enlargement with pulmonary vascular congestion. Mild interstitial pattern suggesting early interstitial edema. Similar appearance to previous study. No blunting of costophrenic angles. No pneumothorax. Mediastinal contours appear intact. IMPRESSION: Cardiac enlargement with pulmonary vascular congestion and mild interstitial edema similar to prior study. Electronically Signed   By: Lucienne Capers M.D.   On: 10/04/2015 23:29    ASSESSMENT AND PLAN  1. Elevated troponin - her initial troponin elevated with marked reduction possibly suggesting lab error. Would not evaluate further in the absence of severe symptoms.  2. Acute on chronic volume overload - a consequence of her ESRD. She is now undergoing HD and volume status much improved. Her EF on my initial estimate looks normal. No additional cardiac evaluation at this point. She will need to adhere to a renal diet. 3. Massive obesity - weight loss would be life saving.  4. ESRD - on HD, as per renal service.   Retaj Hilbun,M.D.  10/07/2015 8:34 AM

## 2015-10-07 NOTE — Consult Note (Signed)
[facility] Renal Consultation Note  Requesting MD: Hal Hope  Indication for Consultation: Hyperkalemia  HPI:  60 year old female presenting with 10 days of progressive dyspnea. PMH is significant for CKD, HTN, T2DM, and seizure disorder. Reports improvement in shortness of breath.   CREATININE, SER  Date/Time Value Ref Range Status  10/07/2015 03:55 AM 9.33* 0.44 - 1.00 mg/dL Final  10/06/2015 02:38 PM 13.57* 0.44 - 1.00 mg/dL Final  10/06/2015 08:02 AM 13.34* 0.44 - 1.00 mg/dL Final  10/05/2015 02:30 PM 15.20* 0.44 - 1.00 mg/dL Final  10/05/2015 10:30 AM 18.70* 0.44 - 1.00 mg/dL Final  10/05/2015 08:19 AM 18.78* 0.44 - 1.00 mg/dL Final  10/05/2015 01:35 AM 18.22* 0.44 - 1.00 mg/dL Final   PMHx:   Past Medical History  Diagnosis Date  . Hypertension   . Kidney disease   . High cholesterol   . Shortness of breath dyspnea     with exertion  . H/O blood clots     many years ago in leg  . Sleep apnea     uses c-pap most of time  . Pneumonia   . Diabetes mellitus without complication ([place])     type 2  . Seizures ([place])     had one or two (more than 10-15 years ago)  . Arthritis   . Constipation     Past Surgical History  Procedure Laterality Date  . Abdominal hysterectomy    . Colonoscopy    . Av fistula placement Left 07/25/2015    Procedure: ARTERIOVENOUS (AV) FISTULA CREATION -LEFT BRACHIOCEPHALIC;  Surgeon: Conrad [place], MD;  Location: [facility] OR;  Service: Vascular;  Laterality: Left;    Family Hx:  Family History  Problem Relation Age of Onset  . Cancer Mother   . Hypertension Father   . Heart attack Sister   . Heart attack Paternal Aunt   . Stroke Neg Hx     Social History:  reports that she has never smoked. She has never used smokeless tobacco. She reports that she does not drink alcohol or use illicit drugs.  Allergies: No Known Allergies  Medications: Prior to Admission medications   Medication Sig Start Date End Date Taking?  Authorizing Provider  amLODipine (NORVASC) 10 MG tablet Take 10 mg by mouth at bedtime.    Yes Historical Provider, MD  aspirin 81 MG tablet Take 81 mg by mouth daily.   Yes Historical Provider, MD  atorvastatin (LIPITOR) 40 MG tablet Take 40 mg by mouth daily.   Yes Historical Provider, MD  Cholecalciferol (VITAMIN D PO) Take 1 tablet by mouth daily.   Yes Historical Provider, MD  cloNIDine (CATAPRES) 0.3 MG tablet Take 0.3 mg by mouth 2 (two) times daily.   Yes Historical Provider, MD  febuxostat (ULORIC) 40 MG tablet Take 40 mg by mouth daily.   Yes Historical Provider, MD  furosemide (LASIX) 80 MG tablet Take 80 mg by mouth daily.   Yes Historical Provider, MD  Liraglutide (VICTOZA) 18 MG/3ML SOPN Inject 1.8 mg into the skin daily.    Yes Historical Provider, MD  metoprolol succinate (TOPROL-XL) 100 MG 24 hr tablet Take 100 mg by mouth 2 (two) times daily. Take with or immediately following a meal.   Yes Historical Provider, MD  Multiple Vitamins-Minerals (MULTIVITAMINS THER. W/MINERALS) TABS tablet Take 1 tablet by mouth daily.   Yes Historical Provider, MD  sacubitril-valsartan (ENTRESTO) 24-26 MG Take 1 tablet by mouth 2 (two) times daily.   Yes Historical Provider,  MD    I have reviewed the patient's current medications.  Labs:  Results for orders placed or performed during the hospital encounter of 10/04/15 (from the past 48 hour(s))  Comprehensive metabolic panel     Status: Abnormal   Collection Time: 10/05/15  8:19 AM  Result Value Ref Range   Sodium 149 (H) 135 - 145 mmol/L   Potassium 6.4 (HH) 3.5 - 5.1 mmol/L    Comment: SLIGHT HEMOLYSIS CRITICAL RESULT CALLED TO, READ BACK BY AND VERIFIED WITH: JOSH DRAPER,RN AT 1006 10/05/15 BY ZBEECH.    Chloride 117 (H) 101 - 111 mmol/L   CO2 14 (L) 22 - 32 mmol/L   Glucose, Bld 95 65 - 99 mg/dL   BUN 159 (H) 6 - 20 mg/dL   Creatinine, Ser 18.78 (H) 0.44 - 1.00 mg/dL   Calcium 8.3 (L) 8.9 - 10.3 mg/dL   Total Protein 6.3 (L) 6.5 - 8.1  g/dL   Albumin 2.0 (L) 3.5 - 5.0 g/dL   AST 21 15 - 41 U/L   ALT 16 14 - 54 U/L   Alkaline Phosphatase 84 38 - 126 U/L   Total Bilirubin 0.6 0.3 - 1.2 mg/dL   GFR calc non Af Amer 2 (L) >60 mL/min   GFR calc Af Amer 2 (L) >60 mL/min    Comment: (NOTE) The eGFR has been calculated using the CKD EPI equation. This calculation has not been validated in all clinical situations. eGFR's persistently <60 mL/min signify possible Chronic Kidney Disease.    Anion gap 18 (H) 5 - 15  Phosphorus     Status: Abnormal   Collection Time: 10/05/15  8:19 AM  Result Value Ref Range   Phosphorus 12.3 (H) 2.5 - 4.6 mg/dL    Comment: RESULTS CONFIRMED BY MANUAL DILUTION  CBC with Differential/Platelet     Status: Abnormal   Collection Time: 10/05/15  8:19 AM  Result Value Ref Range   WBC 11.0 (H) 4.0 - 10.5 K/uL   RBC 2.93 (L) 3.87 - 5.11 MIL/uL   Hemoglobin 7.7 (L) 12.0 - 15.0 g/dL   HCT 23.9 (L) 36.0 - 46.0 %   MCV 81.6 78.0 - 100.0 fL   MCH 26.3 26.0 - 34.0 pg   MCHC 32.2 30.0 - 36.0 g/dL   RDW 15.2 11.5 - 15.5 %   Platelets 253 150 - 400 K/uL   Neutrophils Relative % 78 %   Neutro Abs 8.6 (H) 1.7 - 7.7 K/uL   Lymphocytes Relative 9 %   Lymphs Abs 0.9 0.7 - 4.0 K/uL   Monocytes Relative 12 %   Monocytes Absolute 1.3 (H) 0.1 - 1.0 K/uL   Eosinophils Relative 1 %   Eosinophils Absolute 0.1 0.0 - 0.7 K/uL   Basophils Relative 0 %   Basophils Absolute 0.0 0.0 - 0.1 K/uL  Troponin I     Status: Abnormal   Collection Time: 10/05/15  8:19 AM  Result Value Ref Range   Troponin I 0.11 (H) <0.031 ng/mL    Comment:        PERSISTENTLY INCREASED TROPONIN VALUES IN THE RANGE OF 0.04-0.49 ng/mL CAN BE SEEN IN:       -UNSTABLE ANGINA       -CONGESTIVE HEART FAILURE       -MYOCARDITIS       -CHEST TRAUMA       -ARRYHTHMIAS       -LATE PRESENTING MYOCARDIAL INFARCTION       -COPD   CLINICAL  FOLLOW-UP RECOMMENDED.   Type and screen [place]     Status: None (Preliminary  result)   Collection Time: 10/05/15  8:19 AM  Result Value Ref Range   ABO/RH(D) B POS    Antibody Screen NEG    Sample Expiration 10/08/2015    Unit Number [id number]    Blood Component Type RED CELLS,LR    Unit division 00    Status of Unit ISSUED    Transfusion Status OK TO TRANSFUSE    Crossmatch Result Compatible   ABO/Rh     Status: None   Collection Time: 10/05/15  8:19 AM  Result Value Ref Range   ABO/RH(D) B POS   Renal function panel     Status: Abnormal   Collection Time: 10/05/15 10:30 AM  Result Value Ref Range   Sodium 149 (H) 135 - 145 mmol/L   Potassium 6.3 (HH) 3.5 - 5.1 mmol/L    Comment: NO VISIBLE HEMOLYSIS CRITICAL RESULT CALLED TO, READ BACK BY AND VERIFIED WITH: LESLIE WILSON,RN AT 1121 10/05/15 BY ZBEECH.    Chloride 117 (H) 101 - 111 mmol/L   CO2 15 (L) 22 - 32 mmol/L   Glucose, Bld 93 65 - 99 mg/dL   BUN 161 (H) 6 - 20 mg/dL   Creatinine, Ser 18.70 (H) 0.44 - 1.00 mg/dL   Calcium 8.1 (L) 8.9 - 10.3 mg/dL   Phosphorus 12.3 (H) 2.5 - 4.6 mg/dL    Comment: RESULTS CONFIRMED BY MANUAL DILUTION   Albumin 1.8 (L) 3.5 - 5.0 g/dL   GFR calc non Af Amer 2 (L) >60 mL/min   GFR calc Af Amer 2 (L) >60 mL/min    Comment: (NOTE) The eGFR has been calculated using the CKD EPI equation. This calculation has not been validated in all clinical situations. eGFR's persistently <60 mL/min signify possible Chronic Kidney Disease.    Anion gap 17 (H) 5 - 15  Hepatitis B surface antigen     Status: None   Collection Time: 10/05/15 10:30 AM  Result Value Ref Range   Hepatitis B Surface Ag Negative Negative    Comment: (NOTE) Performed At: [facility] [place], [place] [id number] Lindon Romp MD PY:[id number]   Hepatitis B surface antibody     Status: None   Collection Time: 10/05/15 10:30 AM  Result Value Ref Range   Hep B S Ab Non Reactive     Comment: (NOTE)              Non Reactive: Inconsistent with immunity,                             less than 10 mIU/mL              Reactive:     Consistent with immunity,                            greater than 9.9 mIU/mL Performed At: [facility] [place], [place] [id number] Lindon Romp MD JA:[id number]   Hepatitis B core antibody, total     Status: None   Collection Time: 10/05/15 10:30 AM  Result Value Ref Range   Hep B Core Total Ab Negative Negative    Comment: (NOTE) Performed At: [facility] [place], [place] [id number] Lindon Romp MD OX:[id number]   ALT  Status: None   Collection Time: 10/05/15 10:30 AM  Result Value Ref Range   ALT 14 14 - 54 U/L  I-STAT, chem 8     Status: Abnormal   Collection Time: 10/05/15  2:30 PM  Result Value Ref Range   Sodium 147 (H) 135 - 145 mmol/L   Potassium 4.6 3.5 - 5.1 mmol/L   Chloride 113 (H) 101 - 111 mmol/L   BUN 120 (H) 6 - 20 mg/dL   Creatinine, Ser 15.20 (H) 0.44 - 1.00 mg/dL   Glucose, Bld 88 65 - 99 mg/dL   Calcium, Ion 1.07 (L) 1.12 - 1.23 mmol/L   TCO2 20 0 - 100 mmol/L   Hemoglobin 8.5 (L) 12.0 - 15.0 g/dL   HCT 25.0 (L) 36.0 - 46.0 %  Glucose, capillary     Status: None   Collection Time: 10/05/15  6:21 PM  Result Value Ref Range   Glucose-Capillary 76 65 - 99 mg/dL  Glucose, capillary     Status: None   Collection Time: 10/05/15 11:46 PM  Result Value Ref Range   Glucose-Capillary 72 65 - 99 mg/dL  Glucose, capillary     Status: None   Collection Time: 10/06/15  3:50 AM  Result Value Ref Range   Glucose-Capillary 81 65 - 99 mg/dL  Glucose, capillary     Status: Abnormal   Collection Time: 10/06/15  7:56 AM  Result Value Ref Range   Glucose-Capillary 64 (L) 65 - 99 mg/dL  Renal function panel     Status: Abnormal   Collection Time: 10/06/15  8:02 AM  Result Value Ref Range   Sodium 146 (H) 135 - 145 mmol/L   Potassium 5.1 3.5 - 5.1 mmol/L   Chloride 110 101 - 111 mmol/L   CO2 17 (L) 22 - 32 mmol/L   Glucose, Bld 72 65 - 99 mg/dL   BUN  103 (H) 6 - 20 mg/dL   Creatinine, Ser 13.34 (H) 0.44 - 1.00 mg/dL   Calcium 7.9 (L) 8.9 - 10.3 mg/dL   Phosphorus 9.3 (H) 2.5 - 4.6 mg/dL   Albumin 1.7 (L) 3.5 - 5.0 g/dL   GFR calc non Af Amer 3 (L) >60 mL/min   GFR calc Af Amer 3 (L) >60 mL/min    Comment: (NOTE) The eGFR has been calculated using the CKD EPI equation. This calculation has not been validated in all clinical situations. eGFR's persistently <60 mL/min signify possible Chronic Kidney Disease.    Anion gap 19 (H) 5 - 15  Glucose, capillary     Status: None   Collection Time: 10/06/15  8:46 AM  Result Value Ref Range   Glucose-Capillary 98 65 - 99 mg/dL  Troponin I (q 6hr x 3)     Status: Abnormal   Collection Time: 10/06/15  9:30 AM  Result Value Ref Range   Troponin I 11.23 (HH) <0.031 ng/mL    Comment:        POSSIBLE MYOCARDIAL ISCHEMIA. SERIAL TESTING RECOMMENDED. CRITICAL RESULT CALLED TO, READ BACK BY AND VERIFIED WITH: N.WALLER,RN 10/06/15 '@1004'  BY V.WILKINS   Glucose, capillary     Status: None   Collection Time: 10/06/15 11:57 AM  Result Value Ref Range   Glucose-Capillary 88 65 - 99 mg/dL  Troponin I (q 6hr x 3)     Status: Abnormal   Collection Time: 10/06/15  2:38 PM  Result Value Ref Range   Troponin I 0.06 (H) <0.031 ng/mL    Comment:  PERSISTENTLY INCREASED TROPONIN VALUES IN THE RANGE OF 0.04-0.49 ng/mL CAN BE SEEN IN:       -UNSTABLE ANGINA       -CONGESTIVE HEART FAILURE       -MYOCARDITIS       -CHEST TRAUMA       -ARRYHTHMIAS       -LATE PRESENTING MYOCARDIAL INFARCTION       -COPD   CLINICAL FOLLOW-UP RECOMMENDED.   Renal function panel     Status: Abnormal   Collection Time: 10/06/15  2:38 PM  Result Value Ref Range   Sodium 145 135 - 145 mmol/L   Potassium 5.0 3.5 - 5.1 mmol/L   Chloride 109 101 - 111 mmol/L   CO2 18 (L) 22 - 32 mmol/L   Glucose, Bld 87 65 - 99 mg/dL   BUN 103 (H) 6 - 20 mg/dL   Creatinine, Ser 13.57 (H) 0.44 - 1.00 mg/dL   Calcium 7.9 (L) 8.9 -  10.3 mg/dL   Phosphorus 9.4 (H) 2.5 - 4.6 mg/dL   Albumin 1.6 (L) 3.5 - 5.0 g/dL   GFR calc non Af Amer 3 (L) >60 mL/min   GFR calc Af Amer 3 (L) >60 mL/min    Comment: (NOTE) The eGFR has been calculated using the CKD EPI equation. This calculation has not been validated in all clinical situations. eGFR's persistently <60 mL/min signify possible Chronic Kidney Disease.    Anion gap 18 (H) 5 - 15  CBC     Status: Abnormal   Collection Time: 10/06/15  2:38 PM  Result Value Ref Range   WBC 12.6 (H) 4.0 - 10.5 K/uL   RBC 2.60 (L) 3.87 - 5.11 MIL/uL   Hemoglobin 6.9 (LL) 12.0 - 15.0 g/dL    Comment: REPEATED TO VERIFY CRITICAL RESULT CALLED TO, READ BACK BY AND VERIFIED WITH: NICOLE WALLER,RN AT 1610 10/06/15 BY ZBEECH.    HCT 21.1 (L) 36.0 - 46.0 %   MCV 81.2 78.0 - 100.0 fL   MCH 26.5 26.0 - 34.0 pg   MCHC 32.7 30.0 - 36.0 g/dL   RDW 15.1 11.5 - 15.5 %   Platelets 222 150 - 400 K/uL  Prepare RBC     Status: None   Collection Time: 10/06/15  3:12 PM  Result Value Ref Range   Order Confirmation ORDER PROCESSED BY BLOOD BANK   Heparin level (unfractionated)     Status: Abnormal   Collection Time: 10/06/15  7:26 PM  Result Value Ref Range   Heparin Unfractionated 0.74 (H) 0.30 - 0.70 IU/mL    Comment:        IF HEPARIN RESULTS ARE BELOW EXPECTED VALUES, AND PATIENT DOSAGE HAS BEEN CONFIRMED, SUGGEST FOLLOW UP TESTING OF ANTITHROMBIN III LEVELS.   Glucose, capillary     Status: None   Collection Time: 10/06/15  9:04 PM  Result Value Ref Range   Glucose-Capillary 68 65 - 99 mg/dL  Occult blood card to lab, stool RN will collect     Status: Abnormal   Collection Time: 10/06/15  9:19 PM  Result Value Ref Range   Fecal Occult Bld POSITIVE (A) NEGATIVE  Troponin I (q 6hr x 3)     Status: Abnormal   Collection Time: 10/06/15  9:30 PM  Result Value Ref Range   Troponin I 0.06 (H) <0.031 ng/mL    Comment:        PERSISTENTLY INCREASED TROPONIN VALUES IN THE RANGE OF  0.04-0.49 ng/mL CAN BE SEEN IN:       -  UNSTABLE ANGINA       -CONGESTIVE HEART FAILURE       -MYOCARDITIS       -CHEST TRAUMA       -ARRYHTHMIAS       -LATE PRESENTING MYOCARDIAL INFARCTION       -COPD   CLINICAL FOLLOW-UP RECOMMENDED.   Glucose, capillary     Status: Abnormal   Collection Time: 10/06/15  9:45 PM  Result Value Ref Range   Glucose-Capillary 122 (H) 65 - 99 mg/dL   Comment 1 Notify RN   Glucose, capillary     Status: None   Collection Time: 10/06/15 10:48 PM  Result Value Ref Range   Glucose-Capillary 96 65 - 99 mg/dL   Comment 1 Notify RN   Glucose, capillary     Status: None   Collection Time: 10/07/15  1:22 AM  Result Value Ref Range   Glucose-Capillary 88 65 - 99 mg/dL  Glucose, capillary     Status: None   Collection Time: 10/07/15  3:52 AM  Result Value Ref Range   Glucose-Capillary 81 65 - 99 mg/dL  Renal function panel     Status: Abnormal   Collection Time: 10/07/15  3:55 AM  Result Value Ref Range   Sodium 142 135 - 145 mmol/L   Potassium 4.2 3.5 - 5.1 mmol/L   Chloride 104 101 - 111 mmol/L   CO2 22 22 - 32 mmol/L   Glucose, Bld 88 65 - 99 mg/dL   BUN 55 (H) 6 - 20 mg/dL   Creatinine, Ser 9.33 (H) 0.44 - 1.00 mg/dL   Calcium 7.9 (L) 8.9 - 10.3 mg/dL   Phosphorus 7.2 (H) 2.5 - 4.6 mg/dL   Albumin 1.5 (L) 3.5 - 5.0 g/dL   GFR calc non Af Amer 4 (L) >60 mL/min   GFR calc Af Amer 5 (L) >60 mL/min    Comment: (NOTE) The eGFR has been calculated using the CKD EPI equation. This calculation has not been validated in all clinical situations. eGFR's persistently <60 mL/min signify possible Chronic Kidney Disease.    Anion gap 16 (H) 5 - 15  Iron and TIBC     Status: Abnormal   Collection Time: 10/07/15  3:55 AM  Result Value Ref Range   Iron 30 28 - 170 ug/dL   TIBC 118 (L) 250 - 450 ug/dL   Saturation Ratios 26 10.4 - 31.8 %   UIBC 88 ug/dL  Ferritin     Status: None   Collection Time: 10/07/15  3:55 AM  Result Value Ref Range   Ferritin  47 11 - 307 ng/mL  Vitamin B12     Status: Abnormal   Collection Time: 10/07/15  3:55 AM  Result Value Ref Range   Vitamin B-12 1308 (H) 180 - 914 pg/mL    Comment: (NOTE) This assay is not validated for testing neonatal or myeloproliferative syndrome specimens for Vitamin B12 levels.   Folate     Status: None   Collection Time: 10/07/15  3:55 AM  Result Value Ref Range   Folate 21.1 >5.9 ng/mL  TSH     Status: None   Collection Time: 10/07/15  3:55 AM  Result Value Ref Range   TSH 1.541 0.350 - 4.500 uIU/mL  Heparin level (unfractionated)     Status: None   Collection Time: 10/07/15  3:55 AM  Result Value Ref Range   Heparin Unfractionated 0.51 0.30 - 0.70 IU/mL    Comment:  IF HEPARIN RESULTS ARE BELOW EXPECTED VALUES, AND PATIENT DOSAGE HAS BEEN CONFIRMED, SUGGEST FOLLOW UP TESTING OF ANTITHROMBIN III LEVELS.   CBC with Differential/Platelet     Status: Abnormal   Collection Time: 10/07/15  6:20 AM  Result Value Ref Range   WBC 10.4 4.0 - 10.5 K/uL   RBC 2.85 (L) 3.87 - 5.11 MIL/uL   Hemoglobin 7.6 (L) 12.0 - 15.0 g/dL   HCT 23.1 (L) 36.0 - 46.0 %   MCV 81.1 78.0 - 100.0 fL   MCH 26.7 26.0 - 34.0 pg   MCHC 32.9 30.0 - 36.0 g/dL   RDW 14.6 11.5 - 15.5 %   Platelets 189 150 - 400 K/uL   Neutrophils Relative % 74 %   Neutro Abs 7.7 1.7 - 7.7 K/uL   Lymphocytes Relative 15 %   Lymphs Abs 1.6 0.7 - 4.0 K/uL   Monocytes Relative 9 %   Monocytes Absolute 0.9 0.1 - 1.0 K/uL   Eosinophils Relative 2 %   Eosinophils Absolute 0.2 0.0 - 0.7 K/uL   Basophils Relative 0 %   Basophils Absolute 0.0 0.0 - 0.1 K/uL   ROS: Pertinent items are noted in HPI.  Physical Exam: Filed Vitals:   10/07/15 0500 10/07/15 0600  BP:    Pulse: 95 96  Temp:    Resp: 16 16     General: 60 year old female, lying in hospital bed HEENT: MMM Heart: Distant heart sounds. RRR, no murmurs noted. Lungs: Limited secondary to patient's habitus. Distant breath sounds, but air movement  noted bilaterally.  Abdomen: Obese, S, NT, ND Extremities: 2+ lower extremity edema noted with chronic venous stasis changes Skin: Warm, dry Neuro: alert, no focal deficits.   Assessment/Plan: 1. Acute on Chronic Renal Failure. Volume overloaded on admission. Initial Cr 18.22, improved to 9.3. Phosphorus 7.2. Temporary catheter placed 5/6. Received HD on 5/5 and 5/6. Next planned for 5/8. Renvela for elevated Ph. PT recommended. 2. Hyperkalemia. K 7.1 on arrival, now down to 4.2 s/p kayexalate, lasix, and insulin. On entresto as outpatient.  HD as above.  3. Normocytic Anemia. Hg 7.2. Monitor CBC. 4. Gout. On uloric.  5. HTN. On metoprolol, clonidine, and amlodipine 6. T2DM. On SSI.  7. HLD. On statin and aspirin per primary.   [facility] 10/07/2015, 7:28 AM    Renal Attending; New start ESRD with volume overload and immobility.   Her AVF was diff to stick so a TC was used.  Need to reevaluate AVF(now that 8 liters off) to see if AVF is better.   A decision will need to be made regarding R&R of catheter v. If AVF is usable now.  I think she is too immobile to go to OP kidney centers.   She will probably need a LTAC. Del[place] Blaisdell C

## 2015-10-08 ENCOUNTER — Inpatient Hospital Stay (HOSPITAL_COMMUNITY): Payer: Medicare Other

## 2015-10-08 DIAGNOSIS — D631 Anemia in chronic kidney disease: Secondary | ICD-10-CM

## 2015-10-08 DIAGNOSIS — R195 Other fecal abnormalities: Secondary | ICD-10-CM

## 2015-10-08 DIAGNOSIS — I10 Essential (primary) hypertension: Secondary | ICD-10-CM

## 2015-10-08 LAB — GLUCOSE, CAPILLARY
GLUCOSE-CAPILLARY: 98 mg/dL (ref 65–99)
GLUCOSE-CAPILLARY: 104 mg/dL — AB (ref 65–99)
GLUCOSE-CAPILLARY: 85 mg/dL (ref 65–99)
Glucose-Capillary: 85 mg/dL (ref 65–99)
Glucose-Capillary: 128 mg/dL — ABNORMAL HIGH (ref 65–99)
Glucose-Capillary: 133 mg/dL — ABNORMAL HIGH (ref 65–99)

## 2015-10-08 LAB — HEMOGLOBIN A1C
HEMOGLOBIN A1C: 6.2 % — AB (ref 4.8–5.6)
MEAN PLASMA GLUCOSE: 131 mg/dL

## 2015-10-08 LAB — CBC
HEMATOCRIT: 22.2 % — AB (ref 36.0–46.0)
Hemoglobin: 7.2 g/dL — ABNORMAL LOW (ref 12.0–15.0)
MCH: 26.5 pg (ref 26.0–34.0)
MCHC: 32.4 g/dL (ref 30.0–36.0)
MCV: 81.6 fL (ref 78.0–100.0)
PLATELETS: 205 10*3/uL (ref 150–400)
RBC: 2.72 MIL/uL — ABNORMAL LOW (ref 3.87–5.11)
RDW: 14.8 % (ref 11.5–15.5)
WBC: 10.6 10*3/uL — AB (ref 4.0–10.5)

## 2015-10-08 LAB — RENAL FUNCTION PANEL
Albumin: 1.6 g/dL — ABNORMAL LOW (ref 3.5–5.0)
Anion gap: 16 — ABNORMAL HIGH (ref 5–15)
BUN: 69 mg/dL — AB (ref 6–20)
CHLORIDE: 103 mmol/L (ref 101–111)
CO2: 22 mmol/L (ref 22–32)
Calcium: 7.9 mg/dL — ABNORMAL LOW (ref 8.9–10.3)
Creatinine, Ser: 10.32 mg/dL — ABNORMAL HIGH (ref 0.44–1.00)
GFR calc Af Amer: 4 mL/min — ABNORMAL LOW (ref >60)
GFR calc non Af Amer: 4 mL/min — ABNORMAL LOW (ref >60)
Glucose, Bld: 96 mg/dL (ref 65–99)
POTASSIUM: 5.3 mmol/L — AB (ref 3.5–5.1)
Phosphorus: 9.4 mg/dL — ABNORMAL HIGH (ref 2.5–4.6)
Sodium: 141 mmol/L (ref 135–145)

## 2015-10-08 LAB — AMMONIA: Ammonia: 43 umol/L — ABNORMAL HIGH (ref 9–35)

## 2015-10-08 LAB — PTH, INTACT AND CALCIUM
CALCIUM TOTAL (PTH): 7.7 mg/dL — AB (ref 8.7–10.2)
PTH: 570 pg/mL — AB (ref 15–65)

## 2015-10-08 LAB — HEPARIN LEVEL (UNFRACTIONATED): HEPARIN UNFRACTIONATED: 0.57 [IU]/mL (ref 0.30–0.70)

## 2015-10-08 LAB — PREPARE RBC (CROSSMATCH)

## 2015-10-08 LAB — CORTISOL-AM, BLOOD: Cortisol - AM: 12.5 ug/dL (ref 6.7–22.6)

## 2015-10-08 MED ORDER — DEXTROSE 5 % IV SOLN
1.5000 g | INTRAVENOUS | Status: AC
  Filled 2015-10-08: qty 1.5

## 2015-10-08 MED ORDER — SEVELAMER CARBONATE 800 MG PO TABS
2800.0000 mg | ORAL_TABLET | Freq: Three times a day (TID) | ORAL | Status: DC
  Administered 2015-10-08: 2800 mg via ORAL
  Filled 2015-10-08 (×2): qty 4

## 2015-10-08 MED ORDER — CLONIDINE HCL 0.1 MG PO TABS
0.1000 mg | ORAL_TABLET | Freq: Two times a day (BID) | ORAL | Status: DC
  Administered 2015-10-08 – 2015-10-09 (×2): 0.1 mg via ORAL
  Filled 2015-10-08 (×2): qty 1

## 2015-10-08 MED ORDER — SODIUM CHLORIDE 0.9 % IV SOLN: Freq: Once | INTRAVENOUS | Status: DC

## 2015-10-08 MED ORDER — LACTULOSE 10 GM/15ML PO SOLN: 10.0000 g | Freq: Two times a day (BID) | ORAL | Status: DC | PRN

## 2015-10-08 MED ORDER — DARBEPOETIN ALFA 100 MCG/0.5ML IJ SOSY
100.0000 ug | PREFILLED_SYRINGE | INTRAMUSCULAR | Status: DC
  Filled 2015-10-08: qty 0.5

## 2015-10-08 MED ORDER — HEPARIN SODIUM (PORCINE) 5000 UNIT/ML IJ SOLN
5000.0000 [IU] | Freq: Three times a day (TID) | INTRAMUSCULAR | Status: DC
  Administered 2015-10-08 – 2015-10-29 (×56): 5000 [IU] via SUBCUTANEOUS
  Filled 2015-10-08 (×49): qty 1

## 2015-10-08 MED ORDER — SODIUM CHLORIDE 0.9 % IV SOLN
125.0000 mg | INTRAVENOUS | Status: DC
  Filled 2015-10-08 (×2): qty 10

## 2015-10-08 NOTE — Progress Notes (Addendum)
[name] Rounding Note  HPI:  60 year old female presenting with 10 days of progressive dyspnea. PMH is significant for CKD, HTN, T2DM, and seizure disorder. Reports improvement in shortness of breath. Very appreciative to nephrology team.   Physical Exam: Filed Vitals:   10/08/15 0210 10/08/15 0317  BP:  113/58  Pulse:    Temp: 98.9 F (37.2 C)   Resp:  13     General: 60 year old female, lying in hospital bed HEENT: MMM Heart: Distant heart sounds. RRR, no murmurs noted. Lungs: Limited secondary to patient's habitus. Distant breath sounds, but air movement noted bilaterally.  Abdomen: Obese, S, NT, ND Extremities: 2+ lower extremity edema noted with chronic venous stasis changes Skin: Warm, dry Neuro: alert, no focal deficits.   Labs: BMP Latest Ref Rng 10/08/2015 10/07/2015 10/06/2015  Glucose 65 - 99 mg/dL 96 88 87  BUN 6 - 20 mg/dL 69(H) 55(H) 103(H)  Creatinine 0.44 - 1.00 mg/dL 10.32(H) 9.33(H) 13.57(H)  Sodium 135 - 145 mmol/L 141 142 145  Potassium 3.5 - 5.1 mmol/L 5.3(H) 4.2 5.0  Chloride 101 - 111 mmol/L 103 104 109  CO2 22 - 32 mmol/L 22 22 18(L)  Calcium 8.9 - 10.3 mg/dL 7.9(L) 7.9(L) 7.9(L)    Results for [name], [name] (MRN [id number]) as of 10/08/2015 17:49  Ref. Range 10/07/2015 03:55  Iron Latest Ref Range: 28-170 ug/dL 30  UIBC Latest Units: ug/dL 88  TIBC Latest Ref Range: 250-450 ug/dL 118 (L)  Saturation Ratios Latest Ref Range: 10.4-31.8 % 26     Dg Chest Port 1 View  10/08/2015  CLINICAL DATA:  Cough EXAM: PORTABLE CHEST 1 VIEW COMPARISON:  10/05/2015 FINDINGS: Cardiomegaly again noted. Central mild vascular congestion without convincing pulmonary edema. Mild left basilar atelectasis. Right IJ central line is unchanged in position. IMPRESSION: Central mild vascular congestion without convincing pulmonary edema. Mild left basilar atelectasis. Cardiomegaly again noted. Electronically Signed   By: [name] M.D.   On: 10/08/2015 08:16   Dg  Chest Port 1 View  10/05/2015  CLINICAL DATA:  Central line placement. EXAM: PORTABLE CHEST 1 VIEW COMPARISON:  10/04/2015 FINDINGS: Right central line placement with the tip in the SVC. No pneumothorax. Heart is mildly enlarged. Mild vascular congestion. Mild interstitial prominence similar prior study. No acute bony abnormality. IMPRESSION: Right central line tip in the SVC. No pneumothorax. Otherwise no real change. Electronically Signed   By: [name] M.D.   On: 10/05/2015 12:17   Dg Chest Port 1 View  10/04/2015  CLINICAL DATA:  Chest pain, shortness of breath, and cough for 1 week, worse tonight. EXAM: PORTABLE CHEST 1 VIEW COMPARISON:  09/28/2015 FINDINGS: Shallow inspiration. Cardiac enlargement with pulmonary vascular congestion. Mild interstitial pattern suggesting early interstitial edema. Similar appearance to previous study. No blunting of costophrenic angles. No pneumothorax. Mediastinal contours appear intact. IMPRESSION: Cardiac enlargement with pulmonary vascular congestion and mild interstitial edema similar to prior study. Electronically Signed   By: [name] M.D.   On: 10/04/2015 23:29   Medications . sodium chloride   Intravenous Once  . sodium chloride   Intravenous Once  . amLODipine  10 mg Oral QHS  . aspirin EC  81 mg Oral Daily  . atorvastatin  40 mg Oral Daily  . cefUROXime (ZINACEF)  IV  1.5 g Intravenous To OR  . cloNIDine  0.1 mg Oral BID  . febuxostat  40 mg Oral Daily  . heparin subcutaneous  5,000 Units Subcutaneous  Q8H  . metoprolol succinate  50 mg Oral BID  . multivitamin with minerals  1 tablet Oral Daily  . sevelamer carbonate  2,800 mg Oral TID WC  . sodium chloride flush  3 mL Intravenous Q12H   Background 60 y.o. 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 CXR pulm edema, K >7. Had urgent HD initiated 5/6 via temp  cath, (has AVF that is too deep to be usable and was infiltrated with attempt to use). New ESRD with outpt planning underway, mobility biggest deterrent to outpt HD  Assessment/Plan: 1. Acute on Chronic Renal Failure. Volume overloaded on admission. Initial Cr 18.22, 10.32 today. Phosphorus 9.4 Temporary catheter placed 5/6. Received HD on 5/5 and 5/6. Next planned for today, 5/8.  Renvela for elevated Phos, increasing dose. PT recommended. Will consult Vascular Surgery for Permanent Catheter placement and superficialization of AV fistula. HD again tomorrow then try to get on a TTS schedule 2. Hyperkalemia. K 7.1 on arrival, 5.3 today s/p kayexalate, lasix, and insulin. On entresto as outpatient.  HD as above.  3. Normocytic Anemia. Hg 7.2. Monitor CBC. Transfuse today during dialysis. Needs to start darbe and weekly fe. (see orders) 4. Gout. On uloric.  5. HTN. On metoprolol, clonidine, and amlodipine 6. T2DM. On SSI.  7. HLD. On statin and aspirin per primary.   [place] 10/08/2015, 6:41 AM   I have seen and examined this patient and agree with plan and assessment in the above note. Pt with new ESRD (HD started 5/5), dialysis today via temporary catheter, with plans for placement of a Diatek tomorrow, and superficialization of her deep AVF  in several weeks after the area of infiltration has cleared up (per Dr. Oneida Alar) - high risk wound issues d/t obesity. Her mobility will be an issue in terms of going to an outpt HD unit. She is not mobile at this time - PT is involved. She will need to be able to assist with transfers, sit in a chair for HD, etc if she is to go to an outpt HD unit.  [name] B,MD 10/08/2015 5:39 PM

## 2015-10-08 NOTE — Progress Notes (Signed)
PT Cancellation Note  Patient Details Name: [name] MRN: [id number] DOB: August 12, 1955   Cancelled Treatment:    Reason Eval/Treat Not Completed: Patient at procedure or test/unavailable (Pt in hemodialysis.  Will check back tomorrow.  thanks)   Irwin Brakeman F 10/08/2015, 1:18 PM [facility] [phone] (pager)

## 2015-10-08 NOTE — Progress Notes (Signed)
PROGRESS NOTE  [name]  [id number] DOB: 07-20-1955 DOA: 10/04/2015 PCP: Dwan Bolt, MD Outpatient Specialists:  Dr. Florene Glen, Nephrology  Brief Narrative:   [name] is a 60 y.o. 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 Patient has been having gradually worsening shortness of breath over the last 10 days. Denies any chest pain or productive cough fever chills. Patient states she has gained a lot of weight with peripheral edema. In the ER chest x-ray shows congestion and labs reviewed markedly elevated creatinine and BUN with no old labs to compare. Potassium is also elevated at 7.1 with no EKG changes. Patient also has metabolic acidosis. Patient was given calcium gluconate Lasix insulin and dextrose and Kayexalate.  The patient was transferred to [facility] for hemodialysis.  Assessment & Plan:   Principal Problem:   Acute pulmonary edema (HCC) Active Problems:   Morbid obesity ([place])   Renal failure (ARF), acute on chronic (HCC)   Anemia in chronic kidney disease   Type 2 diabetes mellitus with renal complication (HCC)   Hypertension   Elevated troponin   Hypoglycemia   Occult blood positive stool   Chronic anemia   Acute pulmonary edema, secondary to acute renal failure, having increased coug -  Has had 8.3L removed by HD since admission -  Patient unsure if she is making urine  Cough, worsening -  CXR -  Continue guaifen-cod  Hyperkalemia, 7.1 at admission, secondary to acute renal failure, resolved with Lasix, Kayexalate, dextrose and insulin, and hemodialysis  -  Recurrent hyperkalemia  Elevated troponin, troponin 11 on 5/6, but upon next check was back to 0.06.  Likely false positive. -  D/c heparin gtt -  Continue beta blocker and high dose statin -  Continue daily aspirin -  ECG:  Unchanged, no acute ST  segment changes or T-wave inversions -  Continue prn nitroglycerin -  Cardiology assistance appreciated  -  ECHO:  Severe LVH, normal systolic function, ejection fraction 55-60 percent. Mild aortic valve stenosis, moderately dilated left atrium. PA peak pressure 37 mmHg.  No regional wall motion abnormalities  Somnolence, improving. -  Ammonia level marginally elevated -  Use lactulose for constipation -  Minimize sedating medications > not on any currently -  Continue to monitor for hypoglycemia  Hypernatremia, metabolic acidosis and hyperphosphatemia all secondary to acute on chronic renal failure. Improving with HD -  HD per nephrology  Acute on chronic kidney disease, acute renal failure  -  Left arm fistula was placed 07/25/2015 but unable to use for hemodialysis  -  Will need more permanent HD access prior to discharge -  Mobilize with PT/OT to determine if she would be candidate for outpatient HD vs. LTACH -  Right IJ temporary HD catheter placed by PCCM on 10/05/2015 -  Nephrology assistance appreciated -  Started HD on 10/05/2015, status post 2 sessions   Diabetes mellitus type 2, recurrent hypoglycemia -  A1c pending -  Not on insulin or oral diabetes medications.  No fevers to suggest sepsis.   -  cortisol level 12.5 -  TSH wnl  Essential hypertension, blood pressures elevated.  Anticipate that her blood pressures may trending down with hemodialysis -  Decreased Clonidine to 0.1 mg BID -  Continue amlodipine 10 mg, metoprolol 100 mg by mouth twice a day -  Continue hydralazine when necessary  Gout, stable, continue uloric  Normocytic anemia, likely due to renal failure.  Iron studies, folate, and TSH were within normal limits.  Vitamin B-12 level elevated.  Occult stool positive, but no gross blood in stools.  Will need GI follow up.   -  Transfuse 1 unit PRBC with HD today  DVT prophylaxis:  heparin Code Status:  Full code Family Communication:  Patient  alone Disposition Plan:  Transfer to 6E on tele   Consultants:   Nephrology  Cardiology  Procedures:  Temporary HD catheter placed by PCCM on 10/05/2015 Hemodialysis initiated on 10/05/2015  Antimicrobials:   none   Subjective: Patient states that she had a back wheezy cough. Her hands and legs are less swollen.  Has mild pain in the LLQ  Objective: Filed Vitals:   10/08/15 0144 10/08/15 0210 10/08/15 0317 10/08/15 0801  BP:   113/58 149/67  Pulse:    82  Temp:  98.9 F (37.2 C)  98.8 F (37.1 C)  TempSrc:  Oral  Oral  Resp:   13 16  Height:      Weight: 176 kg (388 lb 0.2 oz)     SpO2:   99% 92%    Intake/Output Summary (Last 24 hours) at 10/08/15 0826 Last data filed at 10/08/15 0300  Gross per 24 hour  Intake  354.5 ml  Output      0 ml  Net  354.5 ml   Filed Weights   10/06/15 1911 10/07/15 0443 10/08/15 0144  Weight: 180 kg (396 lb 13.3 oz) 180 kg (396 lb 13.3 oz) 176 kg (388 lb 0.2 oz)    Examination:  General exam:  Adult Female. NAD HEENT:  NCAT, MMM,  Respiratory system:  Faint wheeze, no rales or rhonchi Cardiovascular system: S1 & S2 heard, RRR. No JVD, murmurs, rubs, gallops or clicks.  Warm extremities Gastrointestinal system: Abdomen is nondistended, soft and mildly TTP in the LLQ without rebound or guarding.  Normal bowel sounds heard. Skin: No rashes, lesions or ulcers. Hands no longer edematous.  Legs with only trace edema MSK:  Normal tone and bulk, obese.  Left arm fistula feels small but has good bruit and thrill. Neuro:  Cranial nerves II through XII grossly intact, no obvious focal neurologic deficits but diffusely weak.  Legs 3/5 bilaterally and arms 4/5 bilaterally  Data Reviewed: I have personally reviewed following labs and imaging studies  CBC:  Recent Labs Lab 10/05/15 0100 10/05/15 0819 10/05/15 1430 10/06/15 1438 10/07/15 0620 10/08/15 0600  WBC 11.3* 11.0*  --  12.6* 10.4 10.6*  NEUTROABS 8.2* 8.6*  --   --  7.7  --    HGB 8.0* 7.7* 8.5* 6.9* 7.6* 7.2*  HCT 24.1* 23.9* 25.0* 21.1* 23.1* 22.2*  MCV 80.9 81.6  --  81.2 81.1 81.6  PLT 247 253  --  222 189 [id number]   Basic Metabolic Panel:  Recent Labs Lab 10/05/15 1030 10/05/15 1430 10/06/15 0802 10/06/15 1438 10/07/15 0355 10/08/15 0257  NA 149* 147* 146* 145 142 141  K 6.3* 4.6 5.1 5.0 4.2 5.3*  CL 117* 113* 110 109 104 103  CO2 15*  --  17* 18* 22 22  GLUCOSE 93 88 72 87 88 96  BUN 161* 120* 103* 103* 55* 69*  CREATININE 18.70* 15.20* 13.34* 13.57* 9.33* 10.32*  CALCIUM 8.1*  --  7.9* 7.9* 7.9* 7.9*  PHOS 12.3*  --  9.3* 9.4* 7.2* 9.4*   GFR: Estimated Creatinine Clearance: 9.9 mL/min (by  C-G formula based on Cr of 10.32). Liver Function Tests:  Recent Labs Lab 10/05/15 0819 10/05/15 1030 10/06/15 0802 10/06/15 1438 10/07/15 0355 10/08/15 0257  AST 21  --   --   --   --   --   ALT 16 14  --   --   --   --   ALKPHOS 84  --   --   --   --   --   BILITOT 0.6  --   --   --   --   --   PROT 6.3*  --   --   --   --   --   ALBUMIN 2.0* 1.8* 1.7* 1.6* 1.5* 1.6*   No results for input(s): LIPASE, AMYLASE in the last 168 hours.  Recent Labs Lab 10/08/15 0240  AMMONIA 43*   Coagulation Profile: No results for input(s): INR, PROTIME in the last 168 hours. Cardiac Enzymes:  Recent Labs Lab 10/05/15 0135 10/05/15 0819 10/06/15 0930 10/06/15 1438 10/06/15 2130  TROPONINI 0.03 0.11* 11.23* 0.06* 0.06*   BNP (last 3 results) No results for input(s): PROBNP in the last 8760 hours. HbA1C: No results for input(s): HGBA1C in the last 72 hours. CBG:  Recent Labs Lab 10/07/15 1254 10/07/15 1653 10/07/15 2136 10/08/15 0213 10/08/15 0803  GLUCAP 95 94 95 98 85   Lipid Profile: No results for input(s): CHOL, HDL, LDLCALC, TRIG, CHOLHDL, LDLDIRECT in the last 72 hours. Thyroid Function Tests:  Recent Labs  10/07/15 0355  TSH 1.541   Anemia Panel:  Recent Labs  10/07/15 0355  VITAMINB12 1308*  FOLATE 21.1  FERRITIN 47   TIBC 118*  IRON 30   Urine analysis: No results found for: COLORURINE, APPEARANCEUR, LABSPEC, PHURINE, GLUCOSEU, HGBUR, BILIRUBINUR, KETONESUR, PROTEINUR, UROBILINOGEN, NITRITE, LEUKOCYTESUR Sepsis Labs: @LABRCNTIP (procalcitonin:4,lacticidven:4)  ) Recent Results (from the past 240 hour(s))  MRSA PCR Screening     Status: None   Collection Time: 10/05/15  7:02 AM  Result Value Ref Range Status   MRSA by PCR NEGATIVE NEGATIVE Final    Comment:        The GeneXpert MRSA Assay (FDA approved for NASAL specimens only), is one component of a comprehensive MRSA colonization surveillance program. It is not intended to diagnose MRSA infection nor to guide or monitor treatment for MRSA infections.       Radiology Studies: Dg Chest Port 1 View  10/08/2015  CLINICAL DATA:  Cough EXAM: PORTABLE CHEST 1 VIEW COMPARISON:  10/05/2015 FINDINGS: Cardiomegaly again noted. Central mild vascular congestion without convincing pulmonary edema. Mild left basilar atelectasis. Right IJ central line is unchanged in position. IMPRESSION: Central mild vascular congestion without convincing pulmonary edema. Mild left basilar atelectasis. Cardiomegaly again noted. Electronically Signed   By: Lahoma Crocker M.D.   On: 10/08/2015 08:16     Scheduled Meds: . sodium chloride   Intravenous Once  . sodium chloride   Intravenous Once  . amLODipine  10 mg Oral QHS  . aspirin EC  81 mg Oral Daily  . atorvastatin  40 mg Oral Daily  . cloNIDine  0.1 mg Oral BID  . febuxostat  40 mg Oral Daily  . heparin subcutaneous  5,000 Units Subcutaneous Q8H  . metoprolol succinate  50 mg Oral BID  . multivitamin with minerals  1 tablet Oral Daily  . sevelamer carbonate  2,400 mg Oral TID WC  . sodium chloride flush  3 mL Intravenous Q12H   Continuous Infusions:  LOS: 3 days    Time spent: 30 min    Janece Canterbury, MD [facility] Pager [phone]  If 7PM-7AM, please contact  night-coverage [email or web address] Password [facility] 10/08/2015, 8:26 AM

## 2015-10-08 NOTE — Progress Notes (Signed)
Patient Name: [name] Date of Encounter: 10/08/2015  Principal Problem:   Acute pulmonary edema [facility]) Active Problems:   Morbid obesity ([place])   Renal failure (ARF), acute on chronic (HCC)   Anemia in chronic kidney disease   Type 2 diabetes mellitus with renal complication (HCC)   Hypertension   Elevated troponin   Hypoglycemia   Occult blood positive stool   Chronic anemia   Primary Cardiologist: Dr. Irish Lack Patient Profile: [name] is a 60 year old female with a past medical history of obesity, CKD stage IV, DM, HTN, systolic CHF last Echo shows normal EF, severe LVH.  She was admitted on 10/04/15 with creatinine of 18, K>7, troponin of 0.11>>11.23. Started HD. Cardiology consulted for CHF.   SUBJECTIVE: Feel ok, tired. No chest pain, no SOB.   OBJECTIVE Filed Vitals:   10/08/15 0144 10/08/15 0210 10/08/15 0317 10/08/15 0801  BP:   113/58 149/67  Pulse:    82  Temp:  98.9 F (37.2 C)  98.8 F (37.1 C)  TempSrc:  Oral  Oral  Resp:   13 16  Height:      Weight: 388 lb 0.2 oz (176 kg)     SpO2:   99% 92%    Intake/Output Summary (Last 24 hours) at 10/08/15 1057 Last data filed at 10/08/15 0300  Gross per 24 hour  Intake  317.5 ml  Output      0 ml  Net  317.5 ml   Filed Weights   10/06/15 1911 10/07/15 0443 10/08/15 0144  Weight: 396 lb 13.3 oz (180 kg) 396 lb 13.3 oz (180 kg) 388 lb 0.2 oz (176 kg)    PHYSICAL EXAM General: Well developed, well nourished,obese female in no acute distress. Head: Normocephalic, atraumatic.  Neck: Supple without bruits, unable to assess JVD. Lungs:  Resp regular and unlabored, CTA. Heart: RRR, S1, S2, no S3, S4, or murmur; no rub. Abdomen: Soft, non-tender, non-distended, BS + x 4.  Extremities: No clubbing, cyanosis, generalized edema.  Neuro: Alert and oriented X 3. Moves all extremities spontaneously. Psych: Normal affect.  LABS: CBC: Recent Labs  10/07/15 0620 10/08/15 0600  WBC 10.4 10.6*  NEUTROABS  7.7  --   HGB 7.6* 7.2*  HCT 23.1* 22.2*  MCV 81.1 81.6  PLT 189 [id number]   Basic Metabolic Panel: Recent Labs  10/07/15 0355 10/08/15 0257  NA 142 141  K 4.2 5.3*  CL 104 103  CO2 22 22  GLUCOSE 88 96  BUN 55* 69*  CREATININE 9.33* 10.32*  CALCIUM 7.9* 7.9*  PHOS 7.2* 9.4*   Liver Function Tests: Recent Labs  10/07/15 0355 10/08/15 0257  ALBUMIN 1.5* 1.6*   Cardiac Enzymes: Recent Labs  10/06/15 0930 10/06/15 1438 10/06/15 2130  TROPONINI 11.23* 0.06* 0.06*    BNP:  B NATRIURETIC PEPTIDE  Date/Time Value Ref Range Status  10/04/2015 11:19 PM 464.4* 0.0 - 100.0 pg/mL Final   Hemoglobin A1C: Recent Labs  10/07/15 0355  HGBA1C 6.2*   Thyroid Function Tests: Recent Labs  10/07/15 0355  TSH 1.541   Anemia Panel: Recent Labs  10/07/15 0355  VITAMINB12 1308*  FOLATE 21.1  FERRITIN 47  TIBC 118*  IRON 30     Current facility-administered medications:  .  0.9 %  sodium chloride infusion, , Intravenous, Once, Janece Canterbury, MD .  0.9 %  sodium chloride infusion, , Intravenous, Once, Janece Canterbury, MD .  acetaminophen (TYLENOL) tablet 650 mg, 650 mg,  Oral, Q6H PRN, 650 mg at 10/06/15 2048 **OR** acetaminophen (TYLENOL) suppository 650 mg, 650 mg, Rectal, Q6H PRN, Rise Patience, MD .  amLODipine (NORVASC) tablet 10 mg, 10 mg, Oral, QHS, Rise Patience, MD, 10 mg at 10/07/15 2145 .  aspirin EC tablet 81 mg, 81 mg, Oral, Daily, Rise Patience, MD, 81 mg at 10/08/15 1023 .  atorvastatin (LIPITOR) tablet 40 mg, 40 mg, Oral, Daily, Rise Patience, MD, 40 mg at 10/08/15 1023 .  cloNIDine (CATAPRES) tablet 0.1 mg, 0.1 mg, Oral, BID, Janece Canterbury, MD, 0.1 mg at 10/08/15 1023 .  febuxostat (ULORIC) tablet 40 mg, 40 mg, Oral, Daily, Rise Patience, MD, 40 mg at 10/08/15 1027 .  guaiFENesin-codeine 100-10 MG/5ML solution 5 mL, 5 mL, Oral, Q4H PRN, Janece Canterbury, MD, 5 mL at 10/08/15 1023 .  heparin injection 5,000 Units, 5,000 Units,  Subcutaneous, Q8H, Janece Canterbury, MD, 5,000 Units at 10/08/15 1026 .  hydrALAZINE (APRESOLINE) injection 10 mg, 10 mg, Intravenous, Q4H PRN, Rise Patience, MD .  lactulose (CHRONULAC) 10 GM/15ML solution 10 g, 10 g, Oral, BID PRN, Janece Canterbury, MD .  metoprolol succinate (TOPROL-XL) 24 hr tablet 50 mg, 50 mg, Oral, BID, Estanislado Emms, MD, 50 mg at 10/07/15 2145 .  multivitamin with minerals tablet 1 tablet, 1 tablet, Oral, Daily, Rise Patience, MD, 1 tablet at 10/08/15 1023 .  nitroGLYCERIN (NITROSTAT) SL tablet 0.4 mg, 0.4 mg, Sublingual, Q5 min PRN, Janece Canterbury, MD .  ondansetron [facility]) tablet 4 mg, 4 mg, Oral, Q6H PRN, 4 mg at 10/07/15 1854 **OR** ondansetron (ZOFRAN) injection 4 mg, 4 mg, Intravenous, Q6H PRN, Rise Patience, MD .  sevelamer carbonate (RENVELA) tablet 2,800 mg, 2,800 mg, Oral, TID WC, Killdeer N Rumley, DO .  sodium chloride flush (NS) 0.9 % injection 3 mL, 3 mL, Intravenous, Q12H, Rise Patience, MD, 3 mL at 10/08/15 1024    TELE:  NSR  ECG: NSR  Echo: 10/07/15 - Left ventricle: The cavity size was normal. Wall thickness was  increased in a pattern of severe LVH. Systolic function was  normal. The estimated ejection fraction was in the range of 55%  to 60%. - Aortic valve: There was very mild stenosis. - Left atrium: The atrium was moderately dilated. - Right ventricle: Catheter in RV - Atrial septum: No defect or patent foramen ovale was identified. - Pulmonary arteries: PA peak pressure: 37 mm Hg (S).   Radiology/Studies: Dg Chest Port 1 View  10/08/2015  CLINICAL DATA:  Cough EXAM: PORTABLE CHEST 1 VIEW COMPARISON:  10/05/2015 FINDINGS: Cardiomegaly again noted. Central mild vascular congestion without convincing pulmonary edema. Mild left basilar atelectasis. Right IJ central line is unchanged in position. IMPRESSION: Central mild vascular congestion without convincing pulmonary edema. Mild left basilar atelectasis. Cardiomegaly  again noted. Electronically Signed   By: Lahoma Crocker M.D.   On: 10/08/2015 08:16     Current Medications:  . sodium chloride   Intravenous Once  . sodium chloride   Intravenous Once  . amLODipine  10 mg Oral QHS  . aspirin EC  81 mg Oral Daily  . atorvastatin  40 mg Oral Daily  . cloNIDine  0.1 mg Oral BID  . febuxostat  40 mg Oral Daily  . heparin subcutaneous  5,000 Units Subcutaneous Q8H  . metoprolol succinate  50 mg Oral BID  . multivitamin with minerals  1 tablet Oral Daily  . sevelamer carbonate  2,800 mg Oral TID WC  .  sodium chloride flush  3 mL Intravenous Q12H      ASSESSMENT AND PLAN: Principal Problem:   Acute pulmonary edema (HCC) Active Problems:   Morbid obesity ([place])   Renal failure (ARF), acute on chronic (HCC)   Anemia in chronic kidney disease   Type 2 diabetes mellitus with renal complication (HCC)   Hypertension   Elevated troponin   Hypoglycemia   Occult blood positive stool   Chronic anemia  1. Elevated troponin: Troponin elevated in setting of ARF and volume overload. Patient has been chest pain free. EKG is not concerning for ischemia. Echo shows normal EF and no wall motion abnormalities. She does have wall thickness consistent with LVH. She is on beta blocker and CCB. No indication for ischemic evaluation at this time. Heparin gtt was discontinued. On daily ASA.   2. HTN: Well controlled on current regimen.    Signed, Arbutus Leas , NP 10:57 AM 10/08/2015 Pager [phone]  Patient seen and independently examined with Jettie Booze, NP. We discussed all aspects of the encounter. I agree with the assessment and plan as stated above.  Patient admitted with acute on chronic diastolic CHF in setting of acute on CKD stage 4 with creatinine 11 and hyperkalemia and 50lb weight gain in past 1.5 months.  She had right IJ catheter placed and had HD on 5/5.  Cardiology was asked to evaluate due to elevated Troponin.  2D echo showed normal LVF with no RWMAs.   Trop elevation peaked at 0.11 (there is one lab value of 11.23 at 9:30am 5/6 but the trop done 1 hour prior was 0.11 and the one done 5 hours later was 0.06 so the 11.23 is c/w lab error.  Elevated trop c/w demand ischemia from acute pulmonary edema and volume overload in setting of acute on CKD stage V.  Her respiratory status has improved with HD.  In setting of normal LVF and no ischemic EKG changes, no further cardiac workup recommended at this time.  Volume management per renal.  She needs aggressive BP control.  Will sign off, call with any questions.  Please have her followup with Dr. Irish Lack upon discharge to decide on further nonischemic cardiac workup.  Signed: Fransico Him, MD [facility] 10/08/2015

## 2015-10-08 NOTE — Progress Notes (Signed)
Pt received one unit of PRBCs; tolerated transfusion noted s/s of a rxn noted.

## 2015-10-08 NOTE — Consult Note (Signed)
Referring Physician: Krystal Eaton MD Patient name: [name] MRN: [id number] DOB: September 18, 1955 Sex: female  REASON FOR CONSULT: poor function right arm fistula  HPI: [name] is a 60 y.o. female,  S/p right arm AV fistula by Dr Bridgett Larsson Feb 2017.  She was seen 4/19 and offered superficialization of the fistula at that time be deferred.  She is now on hemodialysis with hospital admission for fluid overload.  She is currently dialyzing via a temporary right IJ catheter.  Attempt at cannulation of the fistula this admission were unsuccessful.  Other medical problems include hypertension, elevated cholesterol, severe obesity, diabetes, sleep apnea all of which are currently stable.  Past Medical History  Diagnosis Date  . Hypertension   . Kidney disease   . High cholesterol   . Shortness of breath dyspnea     with exertion  . H/O blood clots     many years ago in leg  . Sleep apnea     uses c-pap most of time  . Pneumonia   . Diabetes mellitus without complication ([place])     type 2  . Seizures ([place])     had one or two (more than 10-15 years ago)  . Arthritis   . Constipation    Past Surgical History  Procedure Laterality Date  . Abdominal hysterectomy    . Colonoscopy    . Av fistula placement Left 07/25/2015    Procedure: ARTERIOVENOUS (AV) FISTULA CREATION -LEFT BRACHIOCEPHALIC;  Surgeon: Conrad [place], MD;  Location: [facility] OR;  Service: Vascular;  Laterality: Left;    Family History  Problem Relation Age of Onset  . Cancer Mother   . Hypertension Father   . Heart attack Sister   . Heart attack Paternal Aunt   . Stroke Neg Hx     SOCIAL HISTORY: Social History   Social History  . Marital Status: Single    Spouse Name: N/A  . Number of Children: N/A  . Years of Education: N/A   Occupational History  . Not on file.   Social History Main Topics  . Smoking status: Never Smoker   . Smokeless tobacco: Never Used  . Alcohol Use: No  . Drug Use: No  . Sexual  Activity: Not on file   Other Topics Concern  . Not on file   Social History Narrative    No Known Allergies  Current Facility-Administered Medications  Medication Dose Route Frequency Provider Last Rate Last Dose  . 0.9 %  sodium chloride infusion   Intravenous Once Janece Canterbury, MD      . 0.9 %  sodium chloride infusion   Intravenous Once Janece Canterbury, MD      . acetaminophen (TYLENOL) tablet 650 mg  650 mg Oral Q6H PRN Rise Patience, MD   650 mg at 10/06/15 2048   Or  . acetaminophen (TYLENOL) suppository 650 mg  650 mg Rectal Q6H PRN Rise Patience, MD      . amLODipine (NORVASC) tablet 10 mg  10 mg Oral QHS Rise Patience, MD   10 mg at 10/07/15 2145  . aspirin EC tablet 81 mg  81 mg Oral Daily Rise Patience, MD   81 mg at 10/08/15 1023  . atorvastatin (LIPITOR) tablet 40 mg  40 mg Oral Daily Rise Patience, MD   40 mg at 10/08/15 1023  . cefUROXime (ZINACEF) 1.5 g in dextrose 5 % 50 mL IVPB  1.5 g Intravenous To  OR Samantha J Rhyne, PA-C      . cloNIDine (CATAPRES) tablet 0.1 mg  0.1 mg Oral BID Janece Canterbury, MD   0.1 mg at 10/08/15 1023  . febuxostat (ULORIC) tablet 40 mg  40 mg Oral Daily Rise Patience, MD   40 mg at 10/08/15 1027  . guaiFENesin-codeine 100-10 MG/5ML solution 5 mL  5 mL Oral Q4H PRN Janece Canterbury, MD   5 mL at 10/08/15 1023  . heparin injection 5,000 Units  5,000 Units Subcutaneous Q8H Janece Canterbury, MD   5,000 Units at 10/08/15 1026  . hydrALAZINE (APRESOLINE) injection 10 mg  10 mg Intravenous Q4H PRN Rise Patience, MD      . lactulose (CHRONULAC) 10 GM/15ML solution 10 g  10 g Oral BID PRN Janece Canterbury, MD      . metoprolol succinate (TOPROL-XL) 24 hr tablet 50 mg  50 mg Oral BID Estanislado Emms, MD   50 mg at 10/07/15 2145  . multivitamin with minerals tablet 1 tablet  1 tablet Oral Daily Rise Patience, MD   1 tablet at 10/08/15 1023  . nitroGLYCERIN (NITROSTAT) SL tablet 0.4 mg  0.4 mg Sublingual Q5  min PRN Janece Canterbury, MD      . ondansetron [facility]) tablet 4 mg  4 mg Oral Q6H PRN Rise Patience, MD   4 mg at 10/07/15 1854   Or  . ondansetron (ZOFRAN) injection 4 mg  4 mg Intravenous Q6H PRN Rise Patience, MD      . sevelamer carbonate (RENVELA) tablet 2,800 mg  2,800 mg Oral TID WC Sorrel N Rumley, DO      . sodium chloride flush (NS) 0.9 % injection 3 mL  3 mL Intravenous Q12H Rise Patience, MD   3 mL at 10/08/15 1024    ROS:   General:  No weight loss, Fever, chills  HEENT: No recent headaches, no nasal bleeding, no visual changes, no sore throat  Neurologic: No dizziness, blackouts, seizures. No recent symptoms of stroke or mini- stroke. No recent episodes of slurred speech, or temporary blindness.  Cardiac: No recent episodes of chest pain/pressure, + shortness of breath at rest.  + shortness of breath with exertion.  Denies history of atrial fibrillation or irregular heartbeat  Vascular: No history of rest pain in feet.  No history of claudication.  No history of non-healing ulcer, No history of DVT   Pulmonary: No home oxygen, no productive cough, no hemoptysis,  No asthma or wheezing  Musculoskeletal:  [ x] Arthritis, [x ] Low back pain,  [x ] Joint pain  Hematologic:No history of hypercoagulable state.  No history of easy bleeding.  No history of anemia  Gastrointestinal: No hematochezia or melena,  No gastroesophageal reflux, no trouble swallowing  Urinary: [x ] chronic Kidney disease, [x ] on HD - [ ]  MWF or [ ]  TTHS, [ ]  Burning with urination, [ ]  Frequent urination, [ ]  Difficulty urinating;   Skin: No rashes  Psychological: No history of anxiety,  No history of depression   Physical Examination  Filed Vitals:   10/08/15 1330 10/08/15 1400 10/08/15 1430 10/08/15 1500  BP: 123/72 131/81 155/77 122/75  Pulse: 91 89 87 90  Temp:      TempSrc:      Resp: 16 15 16 18   Height:      Weight:      SpO2: 100% 100% 100% 100%    Body mass  index is 62.66  kg/(m^2).  General:  Alert and oriented, no acute distress Extremity:  Left upper arm AV fistula, palpable thrill ecchymosis and hematoma over proximal aspect of fistula Musculoskeletal: No deformity or edema  Neurologic: Upper and lower extremity motor 5/5 and symmetric  ASSESSMENT:  Fistula is too deep for cannulation.  Also with recent infiltration.   PLAN:  Place diatek catheter tomorrow.  Will schedule for elective superficialization in a few weeks after area of infiltration has resolved since she is going to be high risk for wound problems baseline due to her obesity.  NPO p midnight consent   Ruta Hinds, MD [facility] Office: [phone] Pager: [phone]

## 2015-10-08 NOTE — Care Management Important Message (Signed)
Important Message  Patient Details  Name: [name] MRN: [id number] Date of Birth: [birth year]   Medicare Important Message Given:  Yes    Havier Deeb T, RN 10/08/2015, 1:53 PM

## 2015-10-08 NOTE — Procedures (Signed)
I have personally attended this patient's dialysis session.  2K2Ca bath 2 liter goal 1 unit of blood transfused Temp cath  Jamal Maes, MD [facility] [phone] Pager 10/08/2015, 5:55 PM

## 2015-10-09 ENCOUNTER — Ambulatory Visit (HOSPITAL_COMMUNITY): Payer: Medicare Other

## 2015-10-09 ENCOUNTER — Inpatient Hospital Stay (HOSPITAL_COMMUNITY): Payer: Medicare Other

## 2015-10-09 ENCOUNTER — Encounter (HOSPITAL_COMMUNITY)
Admission: EM | Disposition: A | Discharge status: Skilled Nursing Facility | Payer: Self-pay | Source: Home / Self Care | Attending: Internal Medicine

## 2015-10-09 ENCOUNTER — Inpatient Hospital Stay (HOSPITAL_COMMUNITY): Payer: Medicare Other | Admitting: Anesthesiology

## 2015-10-09 DIAGNOSIS — N185 Chronic kidney disease, stage 5: Secondary | ICD-10-CM

## 2015-10-09 DIAGNOSIS — T82898A Other specified complication of vascular prosthetic devices, implants and grafts, initial encounter: Secondary | ICD-10-CM

## 2015-10-09 HISTORY — PX: INSERTION OF DIALYSIS CATHETER: SHX1324

## 2015-10-09 LAB — CBC
HCT: 25.1 % — ABNORMAL LOW (ref 36.0–46.0)
HEMOGLOBIN: 8.2 g/dL — AB (ref 12.0–15.0)
MCH: 27.8 pg (ref 26.0–34.0)
MCHC: 32.7 g/dL (ref 30.0–36.0)
MCV: 85.1 fL (ref 78.0–100.0)
PLATELETS: 202 10*3/uL (ref 150–400)
RBC: 2.95 MIL/uL — AB (ref 3.87–5.11)
RDW: 14.4 % (ref 11.5–15.5)
WBC: 9.8 10*3/uL (ref 4.0–10.5)

## 2015-10-09 LAB — TYPE AND SCREEN
ABO/RH(D): B POS
Antibody Screen: NEGATIVE
UNIT DIVISION: 0
Unit division: 0

## 2015-10-09 LAB — RENAL FUNCTION PANEL
ALBUMIN: 1.6 g/dL — AB (ref 3.5–5.0)
ANION GAP: 15 (ref 5–15)
BUN: 37 mg/dL — ABNORMAL HIGH (ref 6–20)
CHLORIDE: 100 mmol/L — AB (ref 101–111)
CO2: 24 mmol/L (ref 22–32)
Calcium: 8.2 mg/dL — ABNORMAL LOW (ref 8.9–10.3)
Creatinine, Ser: 6.76 mg/dL — ABNORMAL HIGH (ref 0.44–1.00)
GFR calc Af Amer: 7 mL/min — ABNORMAL LOW (ref >60)
GFR calc non Af Amer: 6 mL/min — ABNORMAL LOW (ref >60)
GLUCOSE: 98 mg/dL (ref 65–99)
PHOSPHORUS: 7 mg/dL — AB (ref 2.5–4.6)
POTASSIUM: 4.2 mmol/L (ref 3.5–5.1)
Sodium: 139 mmol/L (ref 135–145)

## 2015-10-09 LAB — GLUCOSE, CAPILLARY
Glucose-Capillary: 104 mg/dL — ABNORMAL HIGH (ref 65–99)
Glucose-Capillary: 85 mg/dL (ref 65–99)
Glucose-Capillary: 88 mg/dL (ref 65–99)
Glucose-Capillary: 84 mg/dL (ref 65–99)
Glucose-Capillary: 83 mg/dL (ref 65–99)
Glucose-Capillary: 81 mg/dL (ref 65–99)

## 2015-10-09 LAB — SURGICAL PCR SCREEN
MRSA, PCR: NEGATIVE
STAPHYLOCOCCUS AUREUS: NEGATIVE

## 2015-10-09 SURGERY — INSERTION OF DIALYSIS CATHETER
Anesthesia: Monitor Anesthesia Care | Site: Neck | Laterality: Right

## 2015-10-09 MED ORDER — LIDOCAINE HCL (PF) 1 % IJ SOLN
INTRAMUSCULAR | Status: DC | PRN
  Administered 2015-10-09: 8 mL

## 2015-10-09 MED ORDER — DARBEPOETIN ALFA 100 MCG/0.5ML IJ SOSY: 100.0000 ug | PREFILLED_SYRINGE | INTRAMUSCULAR | Status: DC

## 2015-10-09 MED ORDER — PROPOFOL 500 MG/50ML IV EMUL
INTRAVENOUS | Status: DC | PRN
  Administered 2015-10-09: 10 ug/kg/min via INTRAVENOUS

## 2015-10-09 MED ORDER — HEPARIN SODIUM (PORCINE) 1000 UNIT/ML IJ SOLN
INTRAMUSCULAR | Status: DC | PRN
  Administered 2015-10-09: 3.4 mL

## 2015-10-09 MED ORDER — HEPARIN SODIUM (PORCINE) 1000 UNIT/ML IJ SOLN
INTRAMUSCULAR | Status: AC
  Filled 2015-10-09: qty 1

## 2015-10-09 MED ORDER — BACITRACIN-NEOMYCIN-POLYMYXIN OINTMENT TUBE
TOPICAL_OINTMENT | Freq: Every day | CUTANEOUS | Status: DC
  Administered 2015-10-10 – 2015-11-05 (×12): via TOPICAL
  Filled 2015-10-09: qty 15

## 2015-10-09 MED ORDER — DEXTROSE 5 % IV SOLN
1.5000 g | INTRAVENOUS | Status: DC | PRN
  Administered 2015-10-09: 1.5 g via INTRAVENOUS

## 2015-10-09 MED ORDER — MIDAZOLAM HCL 5 MG/5ML IJ SOLN
INTRAMUSCULAR | Status: DC | PRN
  Administered 2015-10-09 (×2): 1 mg via INTRAVENOUS

## 2015-10-09 MED ORDER — PROPOFOL 10 MG/ML IV BOLUS
INTRAVENOUS | Status: AC
  Filled 2015-10-09: qty 20

## 2015-10-09 MED ORDER — PNEUMOCOCCAL VAC POLYVALENT 25 MCG/0.5ML IJ INJ
0.5000 mL | INJECTION | INTRAMUSCULAR | Status: AC
  Administered 2015-10-10: 0.5 mL via INTRAMUSCULAR
  Filled 2015-10-09: qty 0.5

## 2015-10-09 MED ORDER — MIDAZOLAM HCL 2 MG/2ML IJ SOLN
INTRAMUSCULAR | Status: AC
  Filled 2015-10-09: qty 2

## 2015-10-09 MED ORDER — SODIUM CHLORIDE 0.9 % IV SOLN
125.0000 mg | Freq: Once | INTRAVENOUS | Status: DC
  Filled 2015-10-09 (×2): qty 10

## 2015-10-09 MED ORDER — SODIUM CHLORIDE 0.9 % IV SOLN
INTRAVENOUS | Status: DC | PRN
  Administered 2015-10-09: 500 mL

## 2015-10-09 MED ORDER — HYDROMORPHONE HCL 1 MG/ML IJ SOLN: 0.2500 mg | INTRAMUSCULAR | Status: DC | PRN

## 2015-10-09 MED ORDER — FENTANYL CITRATE (PF) 100 MCG/2ML IJ SOLN
INTRAMUSCULAR | Status: DC | PRN
  Administered 2015-10-09 (×2): 50 ug via INTRAVENOUS

## 2015-10-09 MED ORDER — FENTANYL CITRATE (PF) 250 MCG/5ML IJ SOLN
INTRAMUSCULAR | Status: AC
  Filled 2015-10-09: qty 5

## 2015-10-09 MED ORDER — ONDANSETRON HCL 4 MG/2ML IJ SOLN: 4.0000 mg | Freq: Once | INTRAMUSCULAR | Status: DC | PRN

## 2015-10-09 MED ORDER — ROCURONIUM BROMIDE 50 MG/5ML IV SOLN
INTRAVENOUS | Status: AC
  Filled 2015-10-09: qty 1

## 2015-10-09 MED ORDER — SODIUM CHLORIDE 0.9 % IV SOLN
INTRAVENOUS | Status: DC | PRN
  Administered 2015-10-09: 15:00:00 via INTRAVENOUS

## 2015-10-09 MED ORDER — DARBEPOETIN ALFA 100 MCG/0.5ML IJ SOSY
100.0000 ug | PREFILLED_SYRINGE | INTRAMUSCULAR | Status: DC
  Filled 2015-10-09: qty 0.5

## 2015-10-09 MED ORDER — MEPERIDINE HCL 25 MG/ML IJ SOLN: 6.2500 mg | INTRAMUSCULAR | Status: DC | PRN

## 2015-10-09 MED ORDER — SODIUM CHLORIDE 0.9 % IV SOLN
INTRAVENOUS | Status: DC
  Administered 2015-10-09: 15:00:00 via INTRAVENOUS

## 2015-10-09 MED ORDER — LIDOCAINE HCL (PF) 1 % IJ SOLN
INTRAMUSCULAR | Status: AC
  Filled 2015-10-09: qty 30

## 2015-10-09 SURGICAL SUPPLY — 48 items
BAG DECANTER FOR FLEXI CONT (MISCELLANEOUS) ×3 IMPLANT
BIOPATCH RED 1 DISK 7.0 (GAUZE/BANDAGES/DRESSINGS) ×2 IMPLANT
BIOPATCH RED 1IN DISK 7.0MM (GAUZE/BANDAGES/DRESSINGS) ×1
CATH PALINDROME RT-P 15FX19CM (CATHETERS) IMPLANT
CATH PALINDROME RT-P 15FX23CM (CATHETERS) ×3 IMPLANT
CATH PALINDROME RT-P 15FX28CM (CATHETERS) IMPLANT
CATH PALINDROME RT-P 15FX55CM (CATHETERS) IMPLANT
CATH STRAIGHT 5FR 65CM (CATHETERS) IMPLANT
CHLORAPREP W/TINT 26ML (MISCELLANEOUS) ×3 IMPLANT
COVER DOME SNAP 22 D (MISCELLANEOUS) ×3 IMPLANT
COVER PROBE W GEL 5X96 (DRAPES) IMPLANT
COVER SURGICAL LIGHT HANDLE (MISCELLANEOUS) ×3 IMPLANT
DECANTER SPIKE VIAL GLASS SM (MISCELLANEOUS) IMPLANT
DRAPE C-ARM 42X72 X-RAY (DRAPES) ×3 IMPLANT
DRAPE CHEST BREAST 15X10 FENES (DRAPES) ×3 IMPLANT
GAUZE SPONGE 2X2 8PLY STRL LF (GAUZE/BANDAGES/DRESSINGS) ×1 IMPLANT
GAUZE SPONGE 4X4 16PLY XRAY LF (GAUZE/BANDAGES/DRESSINGS) ×3 IMPLANT
GLOVE BIO SURGEON STRL SZ 6.5 (GLOVE) ×2 IMPLANT
GLOVE BIO SURGEON STRL SZ7 (GLOVE) ×6 IMPLANT
GLOVE BIO SURGEON STRL SZ7.5 (GLOVE) ×3 IMPLANT
GLOVE BIO SURGEONS STRL SZ 6.5 (GLOVE) ×1
GLOVE BIOGEL PI IND STRL 6.5 (GLOVE) ×1 IMPLANT
GLOVE BIOGEL PI IND STRL 8 (GLOVE) ×1 IMPLANT
GLOVE BIOGEL PI INDICATOR 6.5 (GLOVE) ×2
GLOVE BIOGEL PI INDICATOR 8 (GLOVE) ×2
GOWN STRL REUS W/ TWL LRG LVL3 (GOWN DISPOSABLE) ×2 IMPLANT
GOWN STRL REUS W/TWL LRG LVL3 (GOWN DISPOSABLE) ×4
KIT BASIN OR (CUSTOM PROCEDURE TRAY) ×3 IMPLANT
KIT ROOM TURNOVER OR (KITS) ×3 IMPLANT
LIQUID BAND (GAUZE/BANDAGES/DRESSINGS) ×3 IMPLANT
NEEDLE 18GX1X1/2 (RX/OR ONLY) (NEEDLE) ×3 IMPLANT
NEEDLE HYPO 25GX1X1/2 BEV (NEEDLE) ×3 IMPLANT
NS IRRIG 1000ML POUR BTL (IV SOLUTION) ×3 IMPLANT
PACK SURGICAL SETUP 50X90 (CUSTOM PROCEDURE TRAY) ×3 IMPLANT
PAD ARMBOARD 7.5X6 YLW CONV (MISCELLANEOUS) ×6 IMPLANT
SET MICROPUNCTURE 5F STIFF (MISCELLANEOUS) IMPLANT
SPONGE GAUZE 2X2 STER 10/PKG (GAUZE/BANDAGES/DRESSINGS) ×2
SPONGE GAUZE 4X4 12PLY STER LF (GAUZE/BANDAGES/DRESSINGS) ×3 IMPLANT
SUT ETHILON 3 0 PS 1 (SUTURE) ×3 IMPLANT
SUT VICRYL 4-0 PS2 18IN ABS (SUTURE) ×3 IMPLANT
SYR 20CC LL (SYRINGE) ×6 IMPLANT
SYR 5ML LL (SYRINGE) ×6 IMPLANT
SYR CONTROL 10ML LL (SYRINGE) ×3 IMPLANT
SYRINGE 10CC LL (SYRINGE) ×3 IMPLANT
TAPE CLOTH SURG 4X10 WHT LF (GAUZE/BANDAGES/DRESSINGS) ×3 IMPLANT
TOWEL OR 17X26 10 PK STRL BLUE (TOWEL DISPOSABLE) ×3 IMPLANT
WATER STERILE IRR 1000ML POUR (IV SOLUTION) ×3 IMPLANT
WIRE AMPLATZ SS-J .035X180CM (WIRE) IMPLANT

## 2015-10-09 NOTE — Anesthesia Preprocedure Evaluation (Signed)
Anesthesia Evaluation  Patient identified by MRN, date of birth, ID band Patient awake    Reviewed: Allergy & Precautions, Patient's Chart, lab work & pertinent test results  Airway Mallampati: II  TM Distance: >3 FB Neck ROM: Full    Dental   Pulmonary shortness of breath, sleep apnea ,    Pulmonary exam normal        Cardiovascular hypertension, Pt. on medications Normal cardiovascular exam     Neuro/Psych    GI/Hepatic   Endo/Other  diabetes, Type 2  Renal/GU ESRF and DialysisRenal disease     Musculoskeletal   Abdominal   Peds  Hematology   Anesthesia Other Findings   Reproductive/Obstetrics                             Anesthesia Physical Anesthesia Plan  ASA: III  Anesthesia Plan: MAC   Post-op Pain Management:    Induction: Intravenous  Airway Management Planned: Natural Airway  Additional Equipment:   Intra-op Plan:   Post-operative Plan:   Informed Consent: I have reviewed the patients History and Physical, chart, labs and discussed the procedure including the risks, benefits and alternatives for the proposed anesthesia with the patient or authorized representative who has indicated his/her understanding and acceptance.     Plan Discussed with: CRNA and Surgeon  Anesthesia Plan Comments:         Anesthesia Quick Evaluation

## 2015-10-09 NOTE — Anesthesia Postprocedure Evaluation (Signed)
Anesthesia Post Note  Patient: [name]  Procedure(s) Performed: Procedure(s) (LRB): INSERTION OF DIALYSIS CATHETER RIGHT INTERNAL JUGULAR  (Right)  Patient location during evaluation: PACU Anesthesia Type: MAC Level of consciousness: awake and alert Pain management: pain level controlled Vital Signs Assessment: post-procedure vital signs reviewed and stable Respiratory status: spontaneous breathing, nonlabored ventilation, respiratory function stable and patient connected to nasal cannula oxygen Cardiovascular status: stable and blood pressure returned to baseline Anesthetic complications: no    Last Vitals:  Filed Vitals:   10/09/15 1645 10/09/15 1710  BP: 139/65 143/60  Pulse: 91 94  Temp: 36.7 C 37.3 C  Resp: 14 16    Last Pain:  Filed Vitals:   10/09/15 1710  PainSc: 6                  Zayvion Stailey DAVID

## 2015-10-09 NOTE — Progress Notes (Signed)
PROGRESS NOTE  [name]  [id number] DOB: May 18, 1956 DOA: 10/04/2015 PCP: Dwan Bolt, MD Outpatient Specialists:  Dr. Florene Glen, Nephrology  Brief Narrative:   [name] is a 60 y.o. 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 Patient had been having gradually worsening shortness of breath. Denied chest pain or productive cough, fever, chills. Patient stated she had gained a lot of weight with peripheral edema. In the ER chest x-ray showed congestion and labs reviewed markedly elevated creatinine and BUN with no old labs to compare. Potassium was 7.1 with no EKG changes. Patient also had metabolic acidosis. She was given calcium gluconate, Lasix, insulin, dextrose, and Kayexalate.  The patient was transferred to [facility] for hemodialysis.  Assessment & Plan:   Principal Problem:   Acute pulmonary edema (HCC) Active Problems:   Morbid obesity ([place])   Renal failure (ARF), acute on chronic (HCC)   Anemia in chronic kidney disease   Type 2 diabetes mellitus with renal complication (HCC)   Hypertension   Elevated troponin   Hypoglycemia   Occult blood positive stool   Chronic anemia   Acute on chronic kidney disease, acute renal failure  -  Left arm fistula was placed 07/25/2015 but unable to use for hemodialysis  -  Tunneled HD access placed on 5/9  -  Nephrology assistance appreciated -  Started HD on 10/05/2015  Acute hypoxic respiratory failure due to acute pulmonary edema, secondary to acute renal failure, improving -  Fluid removal by HD since admission  Cough, improved today -  CXR:  No acute infiltrate -  Continue guaifen-cod  Hyperkalemia, 7.1 at admission, secondary to acute renal failure, resolved with Lasix, Kayexalate, dextrose and insulin, and hemodialysis   Elevated troponin, troponin 11 on 5/6, but upon  next check was back to 0.06.  Likely false positive. -  Continue aspirin, statin, beta blocker -  ECG:  Unchanged, no acute ST segment changes or T-wave inversions -  Cardiology assistance appreciated  -  ECHO demonstrated preserved EF without regional wall motion abnormalities  Somnolence, improving. -  Ammonia level marginally elevated -  Use lactulose for constipation -  Minimize sedating medications > not on any currently -  Continue to monitor for hypoglycemia  Hypernatremia, metabolic acidosis and hyperphosphatemia resolved with HD.   Diabetes mellitus type 2, recurrent hypoglycemia.  A1c 6.2 -  cortisol level 12.5 -  TSH wnl  Essential hypertension, blood pressures trending down with hemodialysis  -  D/c Clonidine -  Continue amlodipine 10 mg, metoprolol 100 mg by mouth twice a day -  Continue hydralazine when necessary  Gout, stable, continue uloric  Normocytic anemia, likely due to renal failure.  Iron studies, folate, and TSH were within normal limits.  Vitamin B-12 level elevated.  Occult stool positive, but no gross blood in stools.   -  Will need GI follow up.   -  Transfused 1 unit PRBC on 5/9  DVT prophylaxis:  heparin Code Status:  Full code Family Communication:  Patient alone Disposition Plan:  To SNF, will need to CLIP/arrange outpatient HD   Consultants:   Nephrology  Cardiology  Procedures:  Temporary HD catheter placed by PCCM on 10/05/2015, removed 5/9 Tunneled HD catheter placed by vascular surgery on 5/9 Hemodialysis initiated on 10/05/2015  ECHO:  Severe LVH, normal systolic function, ejection fraction 55-60 percent. Mild aortic valve  stenosis, moderately dilated left atrium. PA peak pressure 37 mmHg.  No regional wall motion abnormalities  Antimicrobials:   none   Subjective: Patient states that she feels much better.  SOB and cough improved.  Swelling is improved.  Eating okay.  Back is hurting from lying in bed.  Objective: Filed  Vitals:   10/09/15 1615 10/09/15 1630 10/09/15 1645 10/09/15 1710  BP: 110/72 149/67 139/65 143/60  Pulse: 93 90 91 94  Temp:   98 F (36.7 C) 99.1 F (37.3 C)  TempSrc:    Oral  Resp: 17 13 14 16   Height:      Weight:      SpO2: 99% 100% 100% 99%    Intake/Output Summary (Last 24 hours) at 10/09/15 1909 Last data filed at 10/09/15 1819  Gross per 24 hour  Intake    490 ml  Output     10 ml  Net    480 ml   Filed Weights   10/06/15 1911 10/07/15 0443 10/08/15 0144  Weight: 180 kg (396 lb 13.3 oz) 180 kg (396 lb 13.3 oz) 176 kg (388 lb 0.2 oz)    Examination:  General exam:  Adult Female. NAD HEENT:  NCAT, MMM, right IJ HD cath Respiratory system:  Faint wheeze, no rales or rhonchi Cardiovascular system: S1 & S2 heard, RRR. No JVD, murmurs, rubs, gallops or clicks.  Warm extremities Gastrointestinal system: Abdomen is nondistended, soft, nontender Skin: No rashes, lesions or ulcers. Hands no longer edematous.  Legs with only trace edema MSK:  Normal tone and bulk, obese.  Left arm fistula feels small but has good bruit and thrill. Neuro:  Cranial nerves II through XII grossly intact, no obvious focal neurologic deficits but diffusely weak.  Legs 3/5 bilaterally and arms 4/5 bilaterally  Data Reviewed: I have personally reviewed following labs and imaging studies  CBC:  Recent Labs Lab 10/05/15 0100 10/05/15 0819 10/05/15 1430 10/06/15 1438 10/07/15 0620 10/08/15 0600 10/09/15 0727  WBC 11.3* 11.0*  --  12.6* 10.4 10.6* 9.8  NEUTROABS 8.2* 8.6*  --   --  7.7  --   --   HGB 8.0* 7.7* 8.5* 6.9* 7.6* 7.2* 8.2*  HCT 24.1* 23.9* 25.0* 21.1* 23.1* 22.2* 25.1*  MCV 80.9 81.6  --  81.2 81.1 81.6 85.1  PLT 247 253  --  222 189 205 [id number]   Basic Metabolic Panel:  Recent Labs Lab 10/06/15 0802 10/06/15 1438 10/07/15 0355 10/08/15 0257 10/09/15 0727  NA 146* 145 142 141 139  K 5.1 5.0 4.2 5.3* 4.2  CL 110 109 104 103 100*  CO2 17* 18* 22 22 24   GLUCOSE 72 87 88  96 98  BUN 103* 103* 55* 69* 37*  CREATININE 13.34* 13.57* 9.33* 10.32* 6.76*  CALCIUM 7.9* 7.9* 7.9*  7.7* 7.9* 8.2*  PHOS 9.3* 9.4* 7.2* 9.4* 7.0*   GFR: Estimated Creatinine Clearance: 15.2 mL/min (by C-G formula based on Cr of 6.76). Liver Function Tests:  Recent Labs Lab 10/05/15 0819 10/05/15 1030 10/06/15 0802 10/06/15 1438 10/07/15 0355 10/08/15 0257 10/09/15 0727  AST 21  --   --   --   --   --   --   ALT 16 14  --   --   --   --   --   ALKPHOS 84  --   --   --   --   --   --   BILITOT 0.6  --   --   --   --   --   --  PROT 6.3*  --   --   --   --   --   --   ALBUMIN 2.0* 1.8* 1.7* 1.6* 1.5* 1.6* 1.6*   No results for input(s): LIPASE, AMYLASE in the last 168 hours.  Recent Labs Lab 10/08/15 0240  AMMONIA 43*   Coagulation Profile: No results for input(s): INR, PROTIME in the last 168 hours. Cardiac Enzymes:  Recent Labs Lab 10/05/15 0135 10/05/15 0819 10/06/15 0930 10/06/15 1438 10/06/15 2130  TROPONINI 0.03 0.11* 11.23* 0.06* 0.06*   BNP (last 3 results) No results for input(s): PROBNP in the last 8760 hours. HbA1C:  Recent Labs  10/07/15 0355  HGBA1C 6.2*   CBG:  Recent Labs Lab 10/09/15 0740 10/09/15 1156 10/09/15 1411 10/09/15 1600 10/09/15 1708  GLUCAP 104* 85 88 84 83   Lipid Profile: No results for input(s): CHOL, HDL, LDLCALC, TRIG, CHOLHDL, LDLDIRECT in the last 72 hours. Thyroid Function Tests:  Recent Labs  10/07/15 0355  TSH 1.541   Anemia Panel:  Recent Labs  10/07/15 0355  VITAMINB12 1308*  FOLATE 21.1  FERRITIN 47  TIBC 118*  IRON 30   Urine analysis: No results found for: COLORURINE, APPEARANCEUR, LABSPEC, PHURINE, GLUCOSEU, HGBUR, BILIRUBINUR, KETONESUR, PROTEINUR, UROBILINOGEN, NITRITE, LEUKOCYTESUR Sepsis Labs: @LABRCNTIP (procalcitonin:4,lacticidven:4)  ) Recent Results (from the past 240 hour(s))  MRSA PCR Screening     Status: None   Collection Time: 10/05/15  7:02 AM  Result Value Ref  Range Status   MRSA by PCR NEGATIVE NEGATIVE Final    Comment:        The GeneXpert MRSA Assay (FDA approved for NASAL specimens only), is one component of a comprehensive MRSA colonization surveillance program. It is not intended to diagnose MRSA infection nor to guide or monitor treatment for MRSA infections.   Surgical pcr screen     Status: None   Collection Time: 10/09/15  6:17 AM  Result Value Ref Range Status   MRSA, PCR NEGATIVE NEGATIVE Final   Staphylococcus aureus NEGATIVE NEGATIVE Final    Comment:        The Xpert SA Assay (FDA approved for NASAL specimens in patients over 16 years of age), is one component of a comprehensive surveillance program.  Test performance has been validated by [facility] for patients greater than or equal to 19 year old. It is not intended to diagnose infection nor to guide or monitor treatment.       Radiology Studies: Dg Chest Port 1 View  10/09/2015  CLINICAL DATA:  Dialysis catheter insertion EXAM: PORTABLE CHEST 1 VIEW COMPARISON:  10/08/2015 FINDINGS: Right IJ dialysis catheter tips SVC RA junction. No pneumothorax or effusion. Stable cardiomegaly and central vascular congestion. No focal pneumonia, collapse or consolidation. Degenerative changes of the spine. Stable hilar prominence as before. IMPRESSION: Right IJ dialysis catheter tips SVC RA junction. Stable cardiomegaly and central vascular congestion No complicating feature. Electronically Signed   By: Jerilynn Mages.  Shick M.D.   On: 10/09/2015 16:25   Dg Chest Port 1 View  10/08/2015  CLINICAL DATA:  Cough EXAM: PORTABLE CHEST 1 VIEW COMPARISON:  10/05/2015 FINDINGS: Cardiomegaly again noted. Central mild vascular congestion without convincing pulmonary edema. Mild left basilar atelectasis. Right IJ central line is unchanged in position. IMPRESSION: Central mild vascular congestion without convincing pulmonary edema. Mild left basilar atelectasis. Cardiomegaly again noted. Electronically  Signed   By: Lahoma Crocker M.D.   On: 10/08/2015 08:16     Scheduled Meds: . sodium chloride   Intravenous  Once  . amLODipine  10 mg Oral QHS  . aspirin EC  81 mg Oral Daily  . atorvastatin  40 mg Oral Daily  . cloNIDine  0.1 mg Oral BID  . [START ON 10/18/2015] darbepoetin (ARANESP) injection - DIALYSIS  100 mcg Intravenous Q Thu-HD  . [START ON 10/10/2015] darbepoetin (ARANESP) injection - DIALYSIS  100 mcg Intravenous Q Wed-HD  . febuxostat  40 mg Oral Daily  . ferric gluconate (FERRLECIT/NULECIT) IV  125 mg Intravenous Q T,Th,Sa-HD  . [START ON 10/10/2015] ferric gluconate (FERRLECIT/NULECIT) IV  125 mg Intravenous Once  . heparin subcutaneous  5,000 Units Subcutaneous Q8H  . metoprolol succinate  50 mg Oral BID  . multivitamin with minerals  1 tablet Oral Daily  . neomycin-bacitracin-polymyxin   Topical Daily  . [START ON 10/10/2015] pneumococcal 23 valent vaccine  0.5 mL Intramuscular Tomorrow-1000  . sevelamer carbonate  2,800 mg Oral TID WC  . sodium chloride flush  3 mL Intravenous Q12H   Continuous Infusions: . sodium chloride 10 mL/hr at 10/09/15 1447     LOS: 4 days    Time spent: 30 min    Janece Canterbury, MD [facility] Pager [phone]  If 7PM-7AM, please contact night-coverage [email or web address] Password [email or web address] 10/09/2015, 7:09 PM

## 2015-10-09 NOTE — Progress Notes (Addendum)
Per Dr. Conrad [place] ok to use PICC line pig tail from temporary dialysis catheter for IV access. Per Dr. Scot Dock at bedside he advised he may need to do something with temporary cath will not plug into same

## 2015-10-09 NOTE — Progress Notes (Signed)
Patient returned from PACU after having TDC placed. Patient A&Ox4 with no s/s distress. Vitals stable. Received call from [place] in HD that patient will not be having dialysis today and they will plan for tomorrow. Family member at bedside. All medications administered. Dinner tray ordered.  Joellen Jersey, RN.

## 2015-10-09 NOTE — Progress Notes (Signed)
[facility] Rounding Note  HPI:  60 year old female presenting with 10 days of progressive dyspnea. PMH is significant for CKD, HTN, T2DM, and seizure disorder. Continues to note improvement in shortness of breath and edema. Was not able to work with PT yesterday due to a procedure, but will try to work with them today. Complains that her bottom is sore, and has not been moving around in bed - sister wants somebody to get her up to sit on the side of the bed but she is currently not even assisting in turning.    Physical Exam: Filed Vitals:   10/09/15 0556 10/09/15 0742  BP: 152/68 139/73  Pulse: 94 95  Temp: 98.9 F (37.2 C) 99.1 F (37.3 C)  Resp: 19 18     General: 60 year old female, lying in hospital bed HEENT: MMM Heart: Distant heart sounds. RRR, no murmurs noted. Lungs: Limited secondary to patient's habitus. Distant breath sounds, but air movement noted bilaterally.  Abdomen: Obese, S, NT, ND Extremities: lower extremity edema noted with chronic venous stasis changes Skin: Warm, dry Neuro: alert, no focal deficits.   Labs: BMP Latest Ref Rng 10/08/2015 10/07/2015 10/07/2015  Glucose 65 - 99 mg/dL 96 88 -  BUN 6 - 20 mg/dL 69(H) 55(H) -  Creatinine 0.44 - 1.00 mg/dL 10.32(H) 9.33(H) -  Sodium 135 - 145 mmol/L 141 142 -  Potassium 3.5 - 5.1 mmol/L 5.3(H) 4.2 -  Chloride 101 - 111 mmol/L 103 104 -  CO2 22 - 32 mmol/L 22 22 -  Calcium 8.9 - 10.3 mg/dL 7.9(L) 7.9(L) 7.7(L)    Results for MARLENA, DUGGIN (MRN [id number]) as of 10/08/2015 17:49  Ref. Range 10/07/2015 03:55  Iron Latest Ref Range: 28-170 ug/dL 30  UIBC Latest Units: ug/dL 88  TIBC Latest Ref Range: 250-450 ug/dL 118 (L)  Saturation Ratios Latest Ref Range: 10.4-31.8 % 26   Basic Metabolic Panel:  Recent Labs  10/08/15 0257 10/09/15 0727  NA 141 139  K 5.3* 4.2  CL 103 100*  CO2 22 24  GLUCOSE 96 98  BUN 69* 37*  CREATININE 10.32* 6.76*  CALCIUM 7.9* 8.2*  PHOS 9.4* 7.0*   Liver  Function Tests:  Recent Labs  10/08/15 0257 10/09/15 0727  ALBUMIN 1.6* 1.6*   CBC:  Recent Labs  10/07/15 0620 10/08/15 0600 10/09/15 0727  WBC 10.4 10.6* 9.8  NEUTROABS 7.7  --   --   HGB 7.6* 7.2* 8.2*  HCT 23.1* 22.2* 25.1*  MCV 81.1 81.6 85.1  PLT 189 205 202   Anemia Panel:  Recent Labs  10/07/15 0355  VITAMINB12 1308*  FOLATE 21.1  FERRITIN 47  TIBC 118*  IRON 30     Dg Chest Port 1 View  10/08/2015  CLINICAL DATA:  Cough EXAM: PORTABLE CHEST 1 VIEW COMPARISON:  10/05/2015 FINDINGS: Cardiomegaly again noted. Central mild vascular congestion without convincing pulmonary edema. Mild left basilar atelectasis. Right IJ central line is unchanged in position. IMPRESSION: Central mild vascular congestion without convincing pulmonary edema. Mild left basilar atelectasis. Cardiomegaly again noted. Electronically Signed   By: Lahoma Crocker M.D.   On: 10/08/2015 08:16   Dg Chest Port 1 View  10/05/2015  CLINICAL DATA:  Central line placement. EXAM: PORTABLE CHEST 1 VIEW COMPARISON:  10/04/2015 FINDINGS: Right central line placement with the tip in the SVC. No pneumothorax. Heart is mildly enlarged. Mild vascular congestion. Mild interstitial prominence similar prior study. No acute bony abnormality. IMPRESSION: Right  central line tip in the SVC. No pneumothorax. Otherwise no real change. Electronically Signed   By: Rolm Baptise M.D.   On: 10/05/2015 12:17   Medications . sodium chloride   Intravenous Once  . amLODipine  10 mg Oral QHS  . aspirin EC  81 mg Oral Daily  . atorvastatin  40 mg Oral Daily  . cefUROXime (ZINACEF)  IV  1.5 g Intravenous To OR  . cloNIDine  0.1 mg Oral BID  . darbepoetin (ARANESP) injection - DIALYSIS  100 mcg Intravenous Q Tue-HD  . febuxostat  40 mg Oral Daily  . ferric gluconate (FERRLECIT/NULECIT) IV  125 mg Intravenous Q T,Th,Sa-HD  . heparin subcutaneous  5,000 Units Subcutaneous Q8H  . metoprolol succinate  50 mg Oral BID  . multivitamin  with minerals  1 tablet Oral Daily  . [START ON 10/10/2015] pneumococcal 23 valent vaccine  0.5 mL Intramuscular Tomorrow-1000  . sevelamer carbonate  2,800 mg Oral TID WC  . sodium chloride flush  3 mL Intravenous Q12H   Background 60 y.o. 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 CXR pulm edema, K >7. Had urgent HD initiated 5/6 via temp cath, (has AVF that is too deep to be usable and was infiltrated with attempt to use). New ESRD with outpt planning underway, mobility biggest deterrent to outpt HD  Assessment/Plan: 1. AKI on CKD4 ->new ESRD. Volume overloaded on admission. Initial Cr 18.22, improved today to 6.76 with dialysis. Phosphorus improved to 7. Temporary catheter placed 5/6. Will receive HD today after Diatek placement by [facility] (AVF to be superficialized at a later date - see their note)  and then transition to TTS schedule. CLIP process was started on admission but with her current lack of mobility, don't see outpt HD in her immediate future. See #9.   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 Check PTH  3. Hyperkalemia. K 7.1 on arrival, today's value 4.2 s/p kayexalate, lasix, and insulin. On entresto as outpatient.  HD as above. 4. Normocytic Anemia. Hg 8.2. Monitor CBC. Continue Aranesp 100 QTuesday and initiate weekly Iron with HD today 5. Gout. On uloric.  6. HTN. On metoprolol, clonidine, and amlodipine 7. T2DM. On SSI.  8. HLD. On statin and aspirin per primary.  9. Severe immobility - was walking PTA (sister confirms pt attended church week before admission) but she is literally doing nothing at this time, not even assisting with turning. Will very likely end up in [place] as too immobile for outpt dialysis.  PT recommended.   [facility] 10/09/2015, 9:17 AM   I have seen and examined this patient and agree  with plan and assessment in the aboe note - please note highlighted additions.Jamal Maes B,MD 10/09/2015 2:01 PM

## 2015-10-09 NOTE — Interval H&P Note (Signed)
History and Physical Interval Note:  10/09/2015 2:26 PM  [name]  has presented today for surgery, with the diagnosis of Stage IV Chronic Kidney Disease N18.4  The various methods of treatment have been discussed with the patient and family. After consideration of risks, benefits and other options for treatment, the patient has consented to  Procedure(s): INSERTION OF DIALYSIS CATHETER (N/A) as a surgical intervention .  The patient's history has been reviewed, patient examined, no change in status, stable for surgery.  I have reviewed the patient's chart and labs.  Questions were answered to the patient's satisfaction.     Deitra Mayo

## 2015-10-09 NOTE — Op Note (Signed)
    NAME: MARCO MOLOCK   MRN: [id number] DOB: 02/11/1956    DATE OF OPERATION: 10/09/2015  PREOP DIAGNOSIS: stage V chronic kidney disease  POSTOP DIAGNOSIS: Same  PROCEDURE: placement of right IJ 23 cm Palindrome catheter  SURGEON: Judeth Cornfield. Scot Dock, MD, FACS  ASSIST: none  ANESTHESIA: local with sedation   EBL: minimal  INDICATIONS: CATHLIN WACHTLER is a 60 y.o. female resident temporary right IJ catheter and I was asked to exchange this for a tunneled catheter.  FINDINGS: catheter tip in right atrium  TECHNIQUE: The patient was taken to the operating room and positioned  On the operating table and sedated. The neck and upper chest were prepped and draped in usual sterile fashion. The catheter was clamped and divided. A guidewire was advanced through the catheter  And the old catheter was removed. The peel-away sheath and dilator were advanced over the wire into the right atrium and the wire and dilator were removed. The 23 cm catheter was advanced through the peel-away sheath down into the right atrium and the peel-away sheath was removed. The exit site for the catheter was selected and the skin anesthetized between the 2 areas. The catheter was then brought through the tunnel, cut to the appropriate length, the distal port were attached. Both ports withdrew easily and flushed with heparin saline and filled with concentrated heparin. Catheter was secured at its exit site with a 3-0 nylon suture. The IJ cannulation site was closed with a 4-0 subcuticular stitch. Sterile dressing was applied. The patient tolerated the procedure well and was transferred to the recovery room in stable condition. All needle and sponge counts were correct.  Deitra Mayo, MD, FACS [facility]  DATE OF DICTATION:   10/09/2015

## 2015-10-09 NOTE — Transfer of Care (Signed)
Immediate Anesthesia Transfer of Care Note  Patient: [name]  Procedure(s) Performed: Procedure(s): INSERTION OF DIALYSIS CATHETER RIGHT INTERNAL JUGULAR  (Right)  Patient Location: PACU  Anesthesia Type:MAC  Level of Consciousness: awake, oriented, sedated and patient cooperative  Airway & Oxygen Therapy: Patient Spontanous Breathing and Patient connected to nasal cannula oxygen  Post-op Assessment: Report given to RN, Post -op Vital signs reviewed and stable and Patient moving all extremities  Post vital signs: Reviewed and stable  Last Vitals:  Filed Vitals:   10/09/15 0742 10/09/15 1557  BP: 139/73 123/60  Pulse: 95 91  Temp: 37.3 C 36.7 C  Resp: 18 13    Last Pain:  Filed Vitals:   10/09/15 1600  PainSc: 6       Patients Stated Pain Goal: 0 ([id number])  Complications: No apparent anesthesia complications

## 2015-10-09 NOTE — Evaluation (Signed)
Physical Therapy Evaluation Patient Details Name: [name] MRN: [id number] DOB: Feb 06, 1956 Today's Date: 10/09/2015   History of Present Illness  60 year old female presenting with 10 days of progressive dyspnea. PMH is significant for CKD, HTN, T2DM, and seizure disorder  Clinical Impression  Pt admitted with above diagnosis. Pt currently with functional limitations due to the deficits listed below (see PT Problem List). At this time the patient is slow to mobilize, requiring +2 assist with bed mobility. Patient was unable to come to standing fully despite +2 max assist and 2 attempts from edge of bed. Based upon the patient's current mobility, recommending SNF for further rehabilitation prior to D/C to home. Will continue to follow and progress as tolerated.        Follow Up Recommendations SNF;Supervision/Assistance - 24 hour    Equipment Recommendations  None recommended by PT    Recommendations for Other Services       Precautions / Restrictions Precautions Precautions: Fall Restrictions Weight Bearing Restrictions: No      Mobility  Bed Mobility Overal bed mobility: +2 for physical assistance;Needs Assistance Bed Mobility: Rolling;Sidelying to Sit;Sit to Supine Rolling: Mod assist;+2 for physical assistance Sidelying to sit: Max assist;+2 for physical assistance   Sit to supine: Mod assist;+2 for physical assistance   General bed mobility comments: Pt able to bridge to help push with BLE and pull with BUE once she is able to reach rails  Transfers Overall transfer level: Needs assistance Equipment used: Rolling walker (2 wheeled) Transfers: Sit to/from Stand Sit to Stand: +2 physical assistance;Max assist         General transfer comment: Attempting sit<>stand transfers from edge of bed X2. Pt unable to achieve full standing position on either attempt but able to clear bed.   Ambulation/Gait                Stairs            Wheelchair  Mobility    Modified Rankin (Stroke Patients Only)       Balance Overall balance assessment: Needs assistance Sitting-balance support: No upper extremity supported Sitting balance-Leahy Scale: Fair                                       Pertinent Vitals/Pain Pain Assessment: No/denies pain    Home Living Family/patient expects to be discharged to:: Private residence Living Arrangements: Other relatives Available Help at Discharge: Available 24 hours/day;Family Type of Home: House Home Access: Stairs to enter Entrance Stairs-Rails: None Entrance Stairs-Number of Steps: 1 Home Layout: One level Home Equipment: Environmental consultant - 2 wheels;Walker - 4 wheels Additional Comments: Pt reports that she lives with her sister and her niece.    Prior Function Level of Independence: Independent with assistive device(s)         Comments: used rw for ambulation. Admits to limited activity PTA.     Hand Dominance   Dominant Hand: Right    Extremity/Trunk Assessment   Upper Extremity Assessment: Defer to OT evaluation           Lower Extremity Assessment: Generalized weakness      Cervical / Trunk Assessment: Normal  Communication   Communication: No difficulties  Cognition Arousal/Alertness: Awake/alert Behavior During Therapy: WFL for tasks assessed/performed Overall Cognitive Status: Within Functional Limits for tasks assessed  General Comments      Exercises Other Exercises Other Exercises: encouraged BUE AROM @ bed level      Assessment/Plan    PT Assessment Patient needs continued PT services  PT Diagnosis Difficulty walking;Generalized weakness   PT Problem List Decreased strength;Decreased activity tolerance;Decreased balance;Decreased mobility  PT Treatment Interventions DME instruction;Gait training;Functional mobility training;Therapeutic activities;Stair training;Therapeutic exercise;Patient/family education    PT Goals (Current goals can be found in the Care Plan section) Acute Rehab PT Goals Patient Stated Goal: be able to return home PT Goal Formulation: With patient Time For Goal Achievement: 10/23/15 Potential to Achieve Goals: Fair    Frequency Min 2X/week   Barriers to discharge        Co-evaluation   Reason for Co-Treatment: Complexity of the patient's impairments (multi-system involvement);For patient/therapist safety PT goals addressed during session: Mobility/safety with mobility OT goals addressed during session: ADL's and self-care;Strengthening/ROM       End of Session Equipment Utilized During Treatment: Gait belt;Oxygen Activity Tolerance: Patient limited by fatigue Patient left: in bed;with call bell/phone within reach Nurse Communication: Mobility status         Time: [id number] PT Time Calculation (min) (ACUTE ONLY): 41 min   Charges:   PT Evaluation $PT Eval Moderate Complexity: 1 Procedure PT Treatments $Therapeutic Activity: 8-22 mins   PT G Codes:        Cassell Clement, PT, CSCS Pager [phone] Office [phone]  10/09/2015, 1:18 PM
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
## 2015-10-09 NOTE — H&P (View-Only) (Signed)
Referring Physician: Krystal Eaton MD Patient name: [name] MRN: [id number] DOB: [birth year] Sex: female  REASON FOR CONSULT: poor function right arm fistula  HPI: [name] is a 60 y.o. female,  S/p right arm AV fistula by Dr Bridgett Larsson Feb 2017.  She was seen 4/19 and offered superficialization of the fistula at that time be deferred.  She is now on hemodialysis with hospital admission for fluid overload.  She is currently dialyzing via a temporary right IJ catheter.  Attempt at cannulation of the fistula this admission were unsuccessful.  Other medical problems include hypertension, elevated cholesterol, severe obesity, diabetes, sleep apnea all of which are currently stable.  Past Medical History  Diagnosis Date  . Hypertension   . Kidney disease   . High cholesterol   . Shortness of breath dyspnea     with exertion  . H/O blood clots     many years ago in leg  . Sleep apnea     uses c-pap most of time  . Pneumonia   . Diabetes mellitus without complication ([place])     type 2  . Seizures ([place])     had one or two (more than 10-15 years ago)  . Arthritis   . Constipation    Past Surgical History  Procedure Laterality Date  . Abdominal hysterectomy    . Colonoscopy    . Av fistula placement Left 07/25/2015    Procedure: ARTERIOVENOUS (AV) FISTULA CREATION -LEFT BRACHIOCEPHALIC;  Surgeon: Conrad [place], MD;  Location: [facility];  Service: Vascular;  Laterality: Left;    Family History  Problem Relation Age of Onset  . Cancer Mother   . Hypertension Father   . Heart attack Sister   . Heart attack Paternal Aunt   . Stroke Neg Hx     SOCIAL HISTORY: Social History   Social History  . Marital Status: Single    Spouse Name: N/A  . Number of Children: N/A  . Years of Education: N/A   Occupational History  . Not on file.   Social History Main Topics  . Smoking status: Never Smoker   . Smokeless tobacco: Never Used  . Alcohol Use: No  . Drug Use: No  . Sexual  Activity: Not on file   Other Topics Concern  . Not on file   Social History Narrative    No Known Allergies  Current Facility-Administered Medications  Medication Dose Route Frequency Provider Last Rate Last Dose  . 0.9 %  sodium chloride infusion   Intravenous Once Janece Canterbury, MD      . 0.9 %  sodium chloride infusion   Intravenous Once Janece Canterbury, MD      . acetaminophen (TYLENOL) tablet 650 mg  650 mg Oral Q6H PRN Rise Patience, MD   650 mg at 10/06/15 2048   Or  . acetaminophen (TYLENOL) suppository 650 mg  650 mg Rectal Q6H PRN Rise Patience, MD      . amLODipine (NORVASC) tablet 10 mg  10 mg Oral QHS Rise Patience, MD   10 mg at 10/07/15 2145  . aspirin EC tablet 81 mg  81 mg Oral Daily Rise Patience, MD   81 mg at 10/08/15 1023  . atorvastatin (LIPITOR) tablet 40 mg  40 mg Oral Daily Rise Patience, MD   40 mg at 10/08/15 1023  . cefUROXime (ZINACEF) 1.5 g in dextrose 5 % 50 mL IVPB  1.5 g Intravenous To  OR Samantha J Rhyne, PA-C      . cloNIDine (CATAPRES) tablet 0.1 mg  0.1 mg Oral BID Janece Canterbury, MD   0.1 mg at 10/08/15 1023  . febuxostat (ULORIC) tablet 40 mg  40 mg Oral Daily Rise Patience, MD   40 mg at 10/08/15 1027  . guaiFENesin-codeine 100-10 MG/5ML solution 5 mL  5 mL Oral Q4H PRN Janece Canterbury, MD   5 mL at 10/08/15 1023  . heparin injection 5,000 Units  5,000 Units Subcutaneous Q8H Janece Canterbury, MD   5,000 Units at 10/08/15 1026  . hydrALAZINE (APRESOLINE) injection 10 mg  10 mg Intravenous Q4H PRN Rise Patience, MD      . lactulose (CHRONULAC) 10 GM/15ML solution 10 g  10 g Oral BID PRN Janece Canterbury, MD      . metoprolol succinate (TOPROL-XL) 24 hr tablet 50 mg  50 mg Oral BID Estanislado Emms, MD   50 mg at 10/07/15 2145  . multivitamin with minerals tablet 1 tablet  1 tablet Oral Daily Rise Patience, MD   1 tablet at 10/08/15 1023  . nitroGLYCERIN (NITROSTAT) SL tablet 0.4 mg  0.4 mg Sublingual Q5  min PRN Janece Canterbury, MD      . ondansetron [facility]) tablet 4 mg  4 mg Oral Q6H PRN Rise Patience, MD   4 mg at 10/07/15 1854   Or  . ondansetron (ZOFRAN) injection 4 mg  4 mg Intravenous Q6H PRN Rise Patience, MD      . sevelamer carbonate (RENVELA) tablet 2,800 mg  2,800 mg Oral TID WC  N Rumley, DO      . sodium chloride flush (NS) 0.9 % injection 3 mL  3 mL Intravenous Q12H Rise Patience, MD   3 mL at 10/08/15 1024    ROS:   General:  No weight loss, Fever, chills  HEENT: No recent headaches, no nasal bleeding, no visual changes, no sore throat  Neurologic: No dizziness, blackouts, seizures. No recent symptoms of stroke or mini- stroke. No recent episodes of slurred speech, or temporary blindness.  Cardiac: No recent episodes of chest pain/pressure, + shortness of breath at rest.  + shortness of breath with exertion.  Denies history of atrial fibrillation or irregular heartbeat  Vascular: No history of rest pain in feet.  No history of claudication.  No history of non-healing ulcer, No history of DVT   Pulmonary: No home oxygen, no productive cough, no hemoptysis,  No asthma or wheezing  Musculoskeletal:  [ x] Arthritis, [x ] Low back pain,  [x ] Joint pain  Hematologic:No history of hypercoagulable state.  No history of easy bleeding.  No history of anemia  Gastrointestinal: No hematochezia or melena,  No gastroesophageal reflux, no trouble swallowing  Urinary: [x ] chronic Kidney disease, [x ] on HD - [ ]  MWF or [ ]  TTHS, [ ]  Burning with urination, [ ]  Frequent urination, [ ]  Difficulty urinating;   Skin: No rashes  Psychological: No history of anxiety,  No history of depression   Physical Examination  Filed Vitals:   10/08/15 1330 10/08/15 1400 10/08/15 1430 10/08/15 1500  BP: 123/72 131/81 155/77 122/75  Pulse: 91 89 87 90  Temp:      TempSrc:      Resp: 16 15 16 18   Height:      Weight:      SpO2: 100% 100% 100% 100%    Body mass  index is 62.66  kg/(m^2).  General:  Alert and oriented, no acute distress Extremity:  Left upper arm AV fistula, palpable thrill ecchymosis and hematoma over proximal aspect of fistula Musculoskeletal: No deformity or edema  Neurologic: Upper and lower extremity motor 5/5 and symmetric  ASSESSMENT:  Fistula is too deep for cannulation.  Also with recent infiltration.   PLAN:  Place diatek catheter tomorrow.  Will schedule for elective superficialization in a few weeks after area of infiltration has resolved since she is going to be high risk for wound problems baseline due to her obesity.  NPO p midnight consent   Ruta Hinds, MD [facility] [place] Office: [phone] Pager: [phone]

## 2015-10-09 NOTE — Progress Notes (Signed)
Occupational Therapy Evaluation Patient Details Name: [name] MRN: [id number] DOB: 06-04-1955 Today's Date: 10/09/2015    History of Present Illness 60 year old female presenting with 10 days of progressive dyspnea. PMH is significant for CKD, HTN, T2DM, and seizure disorder   Clinical Impression   PTA, pt lived at home with family and was mod I with mobility and ADL @ RW level. Pt presents with significant functional change, requiring +2 Max A with bed mobility and Max A with ADL. Pt unable to stand today with +2 Max A. Nsg will need to use Maximove to mobilize pt to chair. Nsg secretary asked to order bariatric recliner and BSC. Pt very motivated to return to PLOF. Pt will need rehab at SNF to facilitate safe return home. Will follow acutely to address established goals and maximize functional level of independence with mobility and ADL.     Follow Up Recommendations  SNF;Supervision/Assistance - 24 hour    Equipment Recommendations  3 in 1 bedside comode;Tub/shower bench (bariatric)    Recommendations for Other Services       Precautions / Restrictions Precautions Precautions: Fall Restrictions Weight Bearing Restrictions: No      Mobility Bed Mobility Overal bed mobility: +2 for physical assistance;Needs Assistance Bed Mobility: Rolling;Sidelying to Sit;Sit to Supine Rolling: Mod assist;+2 for physical assistance Sidelying to sit: Max assist;+2 for physical assistance   Sit to supine: Mod assist;+2 for physical assistance   General bed mobility comments: Pt able to bridge to help push with BLE and pull with BUE once she is able to reach rails; heavy reliance on rails.   Transfers Overall transfer level: Needs assistance   Transfers: Sit to/from Stand Sit to Stand: +2 physical assistance;Max assist;From elevated surface (+# helpful. unable to lift buttocks from bed. )              Balance Overall balance assessment: Needs assistance   Sitting  balance-Leahy Scale: Fair                                      ADL Overall ADL's : Needs assistance/impaired Eating/Feeding: Modified independent   Grooming: Set up;Sitting   Upper Body Bathing: Minimal assitance;Sitting   Lower Body Bathing: Maximal assistance;Bed level   Upper Body Dressing : Moderate assistance;Sitting   Lower Body Dressing: Maximal assistance;Sitting/lateral leans;Bed level     Toilet Transfer Details (indicate cue type and reason): attempted x 2. unable to lift buttocks from bed with Max A +2         Functional mobility during ADLs: Maximal assistance;+2 for physical assistance (+3 would be helpful) General ADL Comments: Pt really wanting to sit up OOB in chair.      Vision     Perception     Praxis      Pertinent Vitals/Pain Pain Assessment: No/denies pain     Hand Dominance Right   Extremity/Trunk Assessment Upper Extremity Assessment Upper Extremity Assessment: Generalized weakness   Lower Extremity Assessment Lower Extremity Assessment: Generalized weakness   Cervical / Trunk Assessment Cervical / Trunk Assessment: Normal   Communication Communication Communication: No difficulties   Cognition Arousal/Alertness: Awake/alert Behavior During Therapy: WFL for tasks assessed/performed Overall Cognitive Status: Within Functional Limits for tasks assessed                     General Comments       Exercises Exercises:  Other exercises Other Exercises Other Exercises: encouraged BUE AROM @ bed level   Shoulder Instructions      Home Living Family/patient expects to be discharged to:: Private residence Living Arrangements: Other relatives Available Help at Discharge: Available 24 hours/day;Family Type of Home: House Home Access: Stairs to enter Entrance Stairs-Number of Steps: 1 Entrance Stairs-Rails: None Home Layout: One level     Bathroom Shower/Tub: Tub/shower unit;Curtain Shower/tub  characteristics: Architectural technologist: Standard Bathroom Accessibility: Yes How Accessible: Accessible via walker ("its tight") Home Equipment: Walker - 2 wheels;Walker - 4 wheels          Prior Functioning/Environment Level of Independence: Independent with assistive device(s) (used RW)        Comments: "up until a coupleof days ago".States she was lmited by SOB does not do dialysis at this time    OT Diagnosis: Generalized weakness   OT Problem List: Decreased strength;Decreased activity tolerance;Impaired balance (sitting and/or standing);Decreased safety awareness;Decreased knowledge of use of DME or AE;Cardiopulmonary status limiting activity;Obesity   OT Treatment/Interventions: Self-care/ADL training;Therapeutic exercise;Energy conservation;DME and/or AE instruction;Therapeutic activities;Patient/family education;Balance training    OT Goals(Current goals can be found in the care plan section) Acute Rehab OT Goals Patient Stated Goal: to walk and take care of myself again OT Goal Formulation: With patient Time For Goal Achievement: 10/23/15 Potential to Achieve Goals: Good ADL Goals Pt Will Perform Upper Body Bathing: with set-up;sitting Pt Will Perform Upper Body Dressing: with set-up;sitting Pt Will Transfer to Toilet: with +2 assist;with mod assist;bedside commode;stand pivot transfer Pt Will Perform Toileting - Clothing Manipulation and hygiene: with mod assist;with adaptive equipment;sitting/lateral leans Pt/caregiver will Perform Home Exercise Program: Both right and left upper extremity;With theraband;With Supervision  OT Frequency: Min 2X/week   Barriers to D/C:            Co-evaluation PT/OT/SLP Co-Evaluation/Treatment: Yes Reason for Co-Treatment: Complexity of the patient's impairments (multi-system involvement);For patient/therapist safety   OT goals addressed during session: ADL's and self-care;Strengthening/ROM      End of Session Equipment  Utilized During Treatment: Gait belt;Rolling walker;Oxygen Nurse Communication: Mobility status;Need for lift equipment  Activity Tolerance: Patient tolerated treatment well Patient left: in bed;with call bell/phone within reach   Time: 1100-1140 OT Time Calculation (min): 40 min Charges:  OT General Charges $OT Visit: 1 Procedure OT Evaluation $OT Eval Moderate Complexity: 1 Procedure G-Codes:    Saima Monterroso,HILLARY 10-30-2015, 12:13 PM   [facility], OTR/L  [phone] Oct 30, 2015

## 2015-10-10 ENCOUNTER — Encounter: Payer: Self-pay | Admitting: Interventional Cardiology

## 2015-10-10 ENCOUNTER — Encounter (HOSPITAL_COMMUNITY): Payer: Self-pay | Admitting: Vascular Surgery

## 2015-10-10 DIAGNOSIS — I5033 Acute on chronic diastolic (congestive) heart failure: Secondary | ICD-10-CM | POA: Diagnosis not present

## 2015-10-10 DIAGNOSIS — E872 Acidosis: Secondary | ICD-10-CM | POA: Diagnosis not present

## 2015-10-10 DIAGNOSIS — J9601 Acute respiratory failure with hypoxia: Secondary | ICD-10-CM | POA: Diagnosis not present

## 2015-10-10 DIAGNOSIS — E662 Morbid (severe) obesity with alveolar hypoventilation: Secondary | ICD-10-CM | POA: Diagnosis not present

## 2015-10-10 DIAGNOSIS — E1122 Type 2 diabetes mellitus with diabetic chronic kidney disease: Secondary | ICD-10-CM | POA: Diagnosis not present

## 2015-10-10 DIAGNOSIS — E11649 Type 2 diabetes mellitus with hypoglycemia without coma: Secondary | ICD-10-CM | POA: Diagnosis not present

## 2015-10-10 DIAGNOSIS — E870 Hyperosmolality and hypernatremia: Secondary | ICD-10-CM | POA: Diagnosis not present

## 2015-10-10 DIAGNOSIS — I132 Hypertensive heart and chronic kidney disease with heart failure and with stage 5 chronic kidney disease, or end stage renal disease: Secondary | ICD-10-CM | POA: Diagnosis not present

## 2015-10-10 DIAGNOSIS — R5381 Other malaise: Secondary | ICD-10-CM | POA: Diagnosis not present

## 2015-10-10 DIAGNOSIS — I248 Other forms of acute ischemic heart disease: Secondary | ICD-10-CM | POA: Diagnosis not present

## 2015-10-10 DIAGNOSIS — Z6841 Body Mass Index (BMI) 40.0 and over, adult: Secondary | ICD-10-CM | POA: Diagnosis not present

## 2015-10-10 DIAGNOSIS — K5900 Constipation, unspecified: Secondary | ICD-10-CM | POA: Diagnosis not present

## 2015-10-10 DIAGNOSIS — I953 Hypotension of hemodialysis: Secondary | ICD-10-CM | POA: Diagnosis not present

## 2015-10-10 DIAGNOSIS — N2581 Secondary hyperparathyroidism of renal origin: Secondary | ICD-10-CM | POA: Diagnosis not present

## 2015-10-10 DIAGNOSIS — G9341 Metabolic encephalopathy: Secondary | ICD-10-CM | POA: Diagnosis not present

## 2015-10-10 DIAGNOSIS — N186 End stage renal disease: Secondary | ICD-10-CM | POA: Diagnosis not present

## 2015-10-10 DIAGNOSIS — R7302 Impaired glucose tolerance (oral): Secondary | ICD-10-CM | POA: Diagnosis not present

## 2015-10-10 DIAGNOSIS — E875 Hyperkalemia: Secondary | ICD-10-CM | POA: Diagnosis not present

## 2015-10-10 DIAGNOSIS — Z992 Dependence on renal dialysis: Secondary | ICD-10-CM | POA: Diagnosis not present

## 2015-10-10 DIAGNOSIS — Z23 Encounter for immunization: Secondary | ICD-10-CM | POA: Diagnosis not present

## 2015-10-10 DIAGNOSIS — E7800 Pure hypercholesterolemia, unspecified: Secondary | ICD-10-CM | POA: Diagnosis not present

## 2015-10-10 DIAGNOSIS — J810 Acute pulmonary edema: Secondary | ICD-10-CM | POA: Diagnosis not present

## 2015-10-10 DIAGNOSIS — M1990 Unspecified osteoarthritis, unspecified site: Secondary | ICD-10-CM | POA: Diagnosis not present

## 2015-10-10 DIAGNOSIS — D631 Anemia in chronic kidney disease: Secondary | ICD-10-CM | POA: Diagnosis not present

## 2015-10-10 DIAGNOSIS — N179 Acute kidney failure, unspecified: Secondary | ICD-10-CM | POA: Diagnosis not present

## 2015-10-10 LAB — RENAL FUNCTION PANEL
Albumin: 1.5 g/dL — ABNORMAL LOW (ref 3.5–5.0)
Anion gap: 15 (ref 5–15)
BUN: 46 mg/dL — ABNORMAL HIGH (ref 6–20)
CHLORIDE: 100 mmol/L — AB (ref 101–111)
CO2: 26 mmol/L (ref 22–32)
CREATININE: 8.25 mg/dL — AB (ref 0.44–1.00)
Calcium: 8.1 mg/dL — ABNORMAL LOW (ref 8.9–10.3)
GFR calc Af Amer: 6 mL/min — ABNORMAL LOW (ref >60)
GFR calc non Af Amer: 5 mL/min — ABNORMAL LOW (ref >60)
Glucose, Bld: 96 mg/dL (ref 65–99)
Phosphorus: 8.3 mg/dL — ABNORMAL HIGH (ref 2.5–4.6)
Potassium: 4.3 mmol/L (ref 3.5–5.1)
Sodium: 141 mmol/L (ref 135–145)

## 2015-10-10 LAB — GLUCOSE, CAPILLARY
GLUCOSE-CAPILLARY: 94 mg/dL (ref 65–99)
GLUCOSE-CAPILLARY: 142 mg/dL — AB (ref 65–99)
Glucose-Capillary: 113 mg/dL — ABNORMAL HIGH (ref 65–99)

## 2015-10-10 LAB — CBC
HCT: 24.8 % — ABNORMAL LOW (ref 36.0–46.0)
Hemoglobin: 8.1 g/dL — ABNORMAL LOW (ref 12.0–15.0)
MCH: 27.9 pg (ref 26.0–34.0)
MCHC: 32.7 g/dL (ref 30.0–36.0)
MCV: 85.5 fL (ref 78.0–100.0)
PLATELETS: 187 10*3/uL (ref 150–400)
RBC: 2.9 MIL/uL — ABNORMAL LOW (ref 3.87–5.11)
RDW: 14.4 % (ref 11.5–15.5)
WBC: 8.6 10*3/uL (ref 4.0–10.5)

## 2015-10-10 MED ORDER — POLYVINYL ALCOHOL 1.4 % OP SOLN
1.0000 [drp] | OPHTHALMIC | Status: DC | PRN
  Filled 2015-10-10: qty 15

## 2015-10-10 MED ORDER — MUSCLE RUB 10-15 % EX CREA
1.0000 "application " | TOPICAL_CREAM | CUTANEOUS | Status: DC | PRN
  Administered 2015-10-10: 1 via TOPICAL
  Filled 2015-10-10 (×2): qty 85

## 2015-10-10 MED ORDER — WITCH HAZEL-GLYCERIN EX PADS
1.0000 "application " | MEDICATED_PAD | CUTANEOUS | Status: DC | PRN
  Filled 2015-10-10: qty 100

## 2015-10-10 MED ORDER — HYDROCORTISONE 1 % EX CREA
1.0000 "application " | TOPICAL_CREAM | Freq: Three times a day (TID) | CUTANEOUS | Status: DC | PRN
  Administered 2015-10-16 – 2015-10-31 (×8): 1 via TOPICAL
  Filled 2015-10-10 (×2): qty 28

## 2015-10-10 MED ORDER — LIP MEDEX EX OINT
1.0000 "application " | TOPICAL_OINTMENT | CUTANEOUS | Status: DC | PRN
  Filled 2015-10-10 (×2): qty 7

## 2015-10-10 MED ORDER — DARBEPOETIN ALFA 100 MCG/0.5ML IJ SOSY
100.0000 ug | PREFILLED_SYRINGE | INTRAMUSCULAR | Status: DC
  Administered 2015-10-10 – 2015-10-17 (×2): 100 ug via INTRAVENOUS
  Filled 2015-10-10 (×2): qty 0.5

## 2015-10-10 MED ORDER — SALINE SPRAY 0.65 % NA SOLN
1.0000 | NASAL | Status: DC | PRN
  Filled 2015-10-10: qty 44

## 2015-10-10 MED ORDER — PHENOL 1.4 % MT LIQD: 1.0000 | OROMUCOSAL | Status: DC | PRN

## 2015-10-10 MED ORDER — DOXERCALCIFEROL 4 MCG/2ML IV SOLN
1.0000 ug | INTRAVENOUS | Status: DC
  Administered 2015-10-10 – 2015-10-19 (×5): 1 ug via INTRAVENOUS
  Filled 2015-10-10 (×6): qty 2

## 2015-10-10 MED ORDER — LORATADINE 10 MG PO TABS: 10.0000 mg | ORAL_TABLET | Freq: Every day | ORAL | Status: DC | PRN

## 2015-10-10 MED ORDER — BLISTEX MEDICATED EX OINT
TOPICAL_OINTMENT | CUTANEOUS | Status: DC | PRN
  Administered 2015-10-10: 19:00:00 via TOPICAL
  Filled 2015-10-10: qty 10

## 2015-10-10 MED ORDER — SEVELAMER CARBONATE 800 MG PO TABS
2400.0000 mg | ORAL_TABLET | Freq: Three times a day (TID) | ORAL | Status: DC
  Administered 2015-10-10 – 2015-11-05 (×65): 2400 mg via ORAL
  Filled 2015-10-10 (×67): qty 3

## 2015-10-10 MED ORDER — SODIUM CHLORIDE 0.9 % IV SOLN
125.0000 mg | INTRAVENOUS | Status: DC
  Administered 2015-10-10 – 2015-10-19 (×5): 125 mg via INTRAVENOUS
  Filled 2015-10-10 (×13): qty 10

## 2015-10-10 MED ORDER — DOXERCALCIFEROL 4 MCG/2ML IV SOLN
INTRAVENOUS | Status: AC
Start: 2015-10-10 — End: 2015-10-10
  Filled 2015-10-10: qty 2

## 2015-10-10 MED ORDER — ALUM & MAG HYDROXIDE-SIMETH 200-200-20 MG/5ML PO SUSP
30.0000 mL | ORAL | Status: DC | PRN
  Administered 2015-10-10: 30 mL via ORAL
  Filled 2015-10-10: qty 30

## 2015-10-10 MED ORDER — DARBEPOETIN ALFA 100 MCG/0.5ML IJ SOSY
PREFILLED_SYRINGE | INTRAMUSCULAR | Status: AC
  Filled 2015-10-10: qty 0.5

## 2015-10-10 MED ORDER — GUAIFENESIN-DM 100-10 MG/5ML PO SYRP: 5.0000 mL | ORAL_SOLUTION | ORAL | Status: DC | PRN

## 2015-10-10 NOTE — Plan of Care (Signed)
Problem: Health Behavior/Discharge Planning: Goal: Ability to manage health-related needs will improve Outcome: Progressing CSW following for possibly SNF placement at discharge.   Problem: Pain Managment: Goal: General experience of comfort will improve Outcome: Not Progressing Patient requiring frequent repositioning, difficult to get comfortable.   Problem: Physical Regulation: Goal: Will remain free from infection Outcome: Completed/Met Date Met:  10/10/15 No s/s infection at this time.  Problem: Skin Integrity: Goal: Risk for impaired skin integrity will decrease Outcome: Progressing Patient at risk for impaired skin integrity. Currently on speciality mattress. Requires frequent repositioning.   Problem: Tissue Perfusion: Goal: Risk factors for ineffective tissue perfusion will decrease Outcome: Completed/Met Date Met:  10/10/15 On SQ Heparin.  Problem: Activity: Goal: Risk for activity intolerance will decrease Outcome: Progressing Will attempt to get patient up to chair using maximove today.

## 2015-10-10 NOTE — Consult Note (Addendum)
WOC assistance requested for use of Interdry.  Discussed patient via phone with bedside nurse.  She describes skin folds beneath abd and breasts as being red, macerated, and moist with partial thickness skin loss.  Appearance is consistent with intertrigo.  Interdry silver-impregnated fabric ordered for use by bedside nurses and instructions provided.  This product should remain in place for 5 days for optimal plan of care to provide antimicrobial benefits and wick moisture away from skin.  Please re-consult if further assistance is needed.  Thank-you,  Julien Girt MSN, [place], [place], [place], [place]

## 2015-10-10 NOTE — Progress Notes (Signed)
[facility] Rounding Note  HPI:   Complains that her bottom is sore, and has not been moving around in bed - sister wants somebody to get her up to sit on the side of the bed but she is currently not even assisting in turning.  Seen in HD - says she DID work some with PT - just moving arms and legs   Physical Exam: Filed Vitals:   10/10/15 0815 10/10/15 0830  BP: 155/79 137/78  Pulse: 89 89  Temp:    Resp:       General: 60 year old female, seen in HD Heart: Distant heart sounds. Regular S1S2 No S3 Lungs: Limited secondary to patient's habitus.  Distant breath sounds, but air movement noted bilaterally.  Abdomen: Obese, soft, large pannus, no focal tenderness Extremities: lower extremity edema noted with chronic venous stasis changes Neuro: alert, no focal deficits.  New R IJ TDC  (5/9) in use for HD  Labs: BMP Latest Ref Rng 10/10/2015 10/09/2015 10/08/2015  Glucose 65 - 99 mg/dL 96 98 96  BUN 6 - 20 mg/dL 46(H) 37(H) 69(H)  Creatinine 0.44 - 1.00 mg/dL 8.25(H) 6.76(H) 10.32(H)  Sodium 135 - 145 mmol/L 141 139 141  Potassium 3.5 - 5.1 mmol/L 4.3 4.2 5.3(H)  Chloride 101 - 111 mmol/L 100(L) 100(L) 103  CO2 22 - 32 mmol/L 26 24 22   Calcium 8.9 - 10.3 mg/dL 8.1(L) 8.2(L) 7.9(L)    Results for [name] (MRN [id number]) as of 10/08/2015 17:49  Ref. Range 10/07/2015 03:55  Iron Latest Ref Range: 28-170 ug/dL 30  UIBC Latest Units: ug/dL 88  TIBC Latest Ref Range: 250-450 ug/dL 118 (L)  Saturation Ratios Latest Ref Range: 10.4-31.8 % 26     Recent Labs  10/09/15 0727 10/10/15 0512  NA 139 141  K 4.2 4.3  CL 100* 100*  CO2 24 26  GLUCOSE 98 96  BUN 37* 46*  CREATININE 6.76* 8.25*  CALCIUM 8.2* 8.1*  PHOS 7.0* 8.3*     Recent Labs  10/09/15 0727 10/10/15 0512  ALBUMIN 1.6* 1.5*     Recent Labs  10/09/15 0727 10/10/15 0518  WBC 9.8 8.6  HGB 8.2* 8.1*  HCT 25.1* 24.8*  MCV 85.1 85.5  PLT 202 187  Results for [name] (MRN  [id number]) as of 10/10/2015 09:03  Ref. Range 10/07/2015 03:55  PTH Latest Ref Range: 15-65 pg/mL 570 (H)    Dg Chest Port 1 View  10/09/2015  CLINICAL DATA:  Dialysis catheter insertion EXAM: PORTABLE CHEST 1 VIEW COMPARISON:  10/08/2015 FINDINGS: Right IJ dialysis catheter tips SVC RA junction. No pneumothorax or effusion. Stable cardiomegaly and central vascular congestion. No focal pneumonia, collapse or consolidation. Degenerative changes of the spine. Stable hilar prominence as before. IMPRESSION: Right IJ dialysis catheter tips SVC RA junction. Stable cardiomegaly and central vascular congestion No complicating feature. Electronically Signed   By: Jerilynn Mages.  Shick M.D.   On: 10/09/2015 16:25   Dg Chest Port 1 View  10/08/2015  CLINICAL DATA:  Cough EXAM: PORTABLE CHEST 1 VIEW COMPARISON:  10/05/2015 FINDINGS: Cardiomegaly again noted. Central mild vascular congestion without convincing pulmonary edema. Mild left basilar atelectasis. Right IJ central line is unchanged in position. IMPRESSION: Central mild vascular congestion without convincing pulmonary edema. Mild left basilar atelectasis. Cardiomegaly again noted. Electronically Signed   By: Lahoma Crocker M.D.   On: 10/08/2015 08:16   Medications . sodium chloride   Intravenous Once  . amLODipine  10 mg  Oral QHS  . aspirin EC  81 mg Oral Daily  . atorvastatin  40 mg Oral Daily  . [START ON 10/18/2015] darbepoetin (ARANESP) injection - DIALYSIS  100 mcg Intravenous Q Thu-HD  . darbepoetin (ARANESP) injection - DIALYSIS  100 mcg Intravenous Q Wed-HD  . febuxostat  40 mg Oral Daily  . ferric gluconate (FERRLECIT/NULECIT) IV  125 mg Intravenous Q T,Th,Sa-HD  . ferric gluconate (FERRLECIT/NULECIT) IV  125 mg Intravenous Once  . heparin subcutaneous  5,000 Units Subcutaneous Q8H  . metoprolol succinate  50 mg Oral BID  . multivitamin with minerals  1 tablet Oral Daily  . neomycin-bacitracin-polymyxin   Topical Daily  . pneumococcal 23 valent vaccine  0.5  mL Intramuscular Tomorrow-1000  . sevelamer carbonate  2,800 mg Oral TID WC  . sodium chloride flush  3 mL Intravenous Q12H   Background 60 y.o. 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 CXR pulm edema, K >7. Had urgent HD initiated 5/6 via temp cath, (has AVF done 07/2015 that is too deep to be usable and was infiltrated with attempt to use). New ESRD with outpt planning underway, mobility biggest deterrent to outpt HD. TDC placed 5/9  Assessment/Plan: 1. AKI on CKD4 ->new ESRD. Volume overloaded on admission. Initial Cr 18.22, Doing OK with HD. Today 4th treatment (got bumped yesterday due to emergencies).   Temporary catheter placed 5/6, replaced with [facility] 5/9. CLIP process was started on admission but with her current lack of mobility, don't see outpt HD in her immediate future. See #9.   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 PTH 570. Start VDRA with HD (not sure if she will go to a unit that uses hectorol or calcitriol - for now will use hectorol 1 mcg TIW with HD. 3. Anemia - getting Darbe 100 QWed. IV Fe with HD. 4. AVF - created 07/25/15. Unable to use. Large infiltration with attempt to use. VVS will superficialize in 2-3 weeks after hematoma resolves. 5. Gout. On uloric.  6. HTN. On metoprolol, clonidine, and amlodipine 7. T2DM. On SSI.  8. HLD. On statin and aspirin per primary.  9. Severe immobility - was walking PTA (sister confirms pt attended church week before admission) but she is literally doing nothing at this time, not even assisting with turning. Will very likely end up in [place] as too immobile for outpt dialysis.  PT working with patient.   Jamal Maes, MD [facility] [phone] Pager 10/10/2015, 9:01 AM

## 2015-10-10 NOTE — Procedures (Signed)
I have personally attended this patient's dialysis session.   Tolerating UF K 4.3 2K bath Hb down to 8.1 Darbe/Fe today UF goal 3-4 liters  Jamal Maes, MD [place] Pager 10/10/2015, 9:15 AM

## 2015-10-10 NOTE — Progress Notes (Signed)
[facility] Progress Note  Patient: [name] [id number]   PCP: Dwan Bolt, MD DOB: December 29, 1955   DOA: 10/04/2015   DOS: 10/10/2015   Date of Service: the patient was seen and examined on 10/10/2015  Subjective: The patient denies any acute complaint. No chest pain or shortness of breath. Nausea and vomiting. Nutrition: Tolerating oral diet  Brief hospital course: Patient was admitted on 10/04/2015, with complaint of shortness of breath progressively worsening without any chest pain or cough or fever or chills, was found to have acute on chronic diastolic dysfunction with elevated troponin and elevated creatinine. Patient also had hyperkalemia with metabolic acidosis. Patient was treated conservatively with Lasix and then transferred to [facility] for hemodialysis Currently further plan is continue hemodialysis and arrange for outpatient discharge HD.  Assessment and Plan: 1. Acute pulmonary edema (HCC) Suspected acute on chronic diastolic dysfunction. Acute on chronic kidney disease stage IV New ESRD Acute hypoxic respiratory failure  Oxygenation is significantly improving with fluid removal. Initially attempted with the Lasix but did not respond and currently on hemodialysis. Left arm fistula placed in 07/25/2015. Unable to use at present. Tunneled HD catheter on May 9. Hemodialysis started on May 5 with temporary catheter and will need lifelong hemodialysis an outpatient HD clip  2. Hyperkalemia. Potassium was 7.1 on admission currently resolved with hemodialysis.  3. Elevated troponin. Troponin was 11 on May 6. Which is likely a false positive. Echocardiogram does not show any acute wall motion abnormality EKG does not show any acute ischemic changes. Currently on aspirin and statin and beta blocker. Cardiology was consulted and appreciate input.  4. Acute encephalopathy. Currently resolving. Avoid sedating medications.  5. Essential  hypertension. Blood pressure getting stable after hemodialysis initiation. Continue amlodipine and metoprolol.  6. Anemia of chronic kidney disease. Iron studies, folic acid, TSH within normal limits. B-12 elevated. No active bleeding. Transfused 1 unit on May of 9. Monitor serial H&H.  Pain management: Tylenol when necessary Activity: physical therapy SNF Bowel regimen: last BM 10/07/2015 Diet: Renal diet DVT Prophylaxis: subcutaneous Heparin  Advance goals of care discussion: Full code  Family Communication: no family was present at bedside, at the time of interview.   Disposition:  Discharge to SNF pending clip Expected discharge date: 10/12/2015   Consultants: Nephrology, vascular surgery Procedures: Hemodialysis, TDC placement  Antibiotics: Anti-infectives    Start     Dose/Rate Route Frequency Ordered Stop   10/08/15 1515  cefUROXime (ZINACEF) 1.5 g in dextrose 5 % 50 mL IVPB     1.5 g 100 mL/hr over 30 Minutes Intravenous To Surgery 10/08/15 1500 10/09/15 1515        Intake/Output Summary (Last 24 hours) at 10/10/15 1737 Last data filed at 10/10/15 1500  Gross per 24 hour  Intake    600 ml  Output   4000 ml  Net  -3400 ml   Filed Weights   10/08/15 0144 10/10/15 0810 10/10/15 1211  Weight: 176 kg (388 lb 0.2 oz) 180 kg (396 lb 13.3 oz) 176 kg (388 lb 0.2 oz)    Objective: Physical Exam: Filed Vitals:   10/10/15 1100 10/10/15 1138 10/10/15 1155 10/10/15 1211  BP: 145/71 144/77 141/76 144/75  Pulse: 91 90 92 95  Temp:    98.6 F (37 C)  TempSrc:    Oral  Resp:    19  Height:      Weight:    176 kg (388 lb 0.2 oz)  SpO2:  96%    General: Alert, Awake and Oriented to Time, Place and Person. Appear in mild distress Eyes: PERRL, Conjunctiva normal ENT: Oral Mucosa clear moist. Neck: difficult to assess  JVD, no Abnormal Mass Or lump Cardiovascular: S1 and S2 Present, aortic systolic Murmur, Peripheral Pulses Present Respiratory: Bilateral Air  entry equal and Decreased,  Clear to Auscultation, no Crackles, no wheezes Abdomen: Bowel Sound present, Soft and no tenderness Skin: redness no, no Rash  Extremities: trace Pedal edema, no calf tenderness Neurologic: Grossly no focal neuro deficit. Bilaterally Equal motor strength  Data Reviewed: CBC:  Recent Labs Lab 10/05/15 0100 10/05/15 0819  10/06/15 1438 10/07/15 0620 10/08/15 0600 10/09/15 0727 10/10/15 0518  WBC 11.3* 11.0*  --  12.6* 10.4 10.6* 9.8 8.6  NEUTROABS 8.2* 8.6*  --   --  7.7  --   --   --   HGB 8.0* 7.7*  < > 6.9* 7.6* 7.2* 8.2* 8.1*  HCT 24.1* 23.9*  < > 21.1* 23.1* 22.2* 25.1* 24.8*  MCV 80.9 81.6  --  81.2 81.1 81.6 85.1 85.5  PLT 247 253  --  222 189 205 202 187  < > = values in this interval not displayed. Basic Metabolic Panel:  Recent Labs Lab 10/06/15 1438 10/07/15 0355 10/08/15 0257 10/09/15 0727 10/10/15 0512  NA 145 142 141 139 141  K 5.0 4.2 5.3* 4.2 4.3  CL 109 104 103 100* 100*  CO2 18* 22 22 24 26   GLUCOSE 87 88 96 98 96  BUN 103* 55* 69* 37* 46*  CREATININE 13.57* 9.33* 10.32* 6.76* 8.25*  CALCIUM 7.9* 7.9*  7.7* 7.9* 8.2* 8.1*  PHOS 9.4* 7.2* 9.4* 7.0* 8.3*    Liver Function Tests:  Recent Labs Lab 10/05/15 0819 10/05/15 1030  10/06/15 1438 10/07/15 0355 10/08/15 0257 10/09/15 0727 10/10/15 0512  AST 21  --   --   --   --   --   --   --   ALT 16 14  --   --   --   --   --   --   ALKPHOS 84  --   --   --   --   --   --   --   BILITOT 0.6  --   --   --   --   --   --   --   PROT 6.3*  --   --   --   --   --   --   --   ALBUMIN 2.0* 1.8*  < > 1.6* 1.5* 1.6* 1.6* 1.5*  < > = values in this interval not displayed. No results for input(s): LIPASE, AMYLASE in the last 168 hours.  Recent Labs Lab 10/08/15 0240  AMMONIA 43*   Coagulation Profile: No results for input(s): INR, PROTIME in the last 168 hours. Cardiac Enzymes:  Recent Labs Lab 10/05/15 0135 10/05/15 0819 10/06/15 0930 10/06/15 1438  10/06/15 2130  TROPONINI 0.03 0.11* 11.23* 0.06* 0.06*   BNP (last 3 results) No results for input(s): PROBNP in the last 8760 hours.  CBG:  Recent Labs Lab 10/09/15 1411 10/09/15 1600 10/09/15 1708 10/09/15 2003 10/10/15 0740  GLUCAP 88 84 83 81 94    Studies: No results found.   Scheduled Meds: . sodium chloride   Intravenous Once  . amLODipine  10 mg Oral QHS  . aspirin EC  81 mg Oral Daily  . atorvastatin  40 mg Oral Daily  . Darbepoetin  Alfa      . darbepoetin (ARANESP) injection - DIALYSIS  100 mcg Intravenous Q Wed-HD  . doxercalciferol      . doxercalciferol  1 mcg Intravenous Q M,W,F-HD  . febuxostat  40 mg Oral Daily  . ferric gluconate (FERRLECIT/NULECIT) IV  125 mg Intravenous Q M,W,F-HD  . heparin subcutaneous  5,000 Units Subcutaneous Q8H  . metoprolol succinate  50 mg Oral BID  . multivitamin with minerals  1 tablet Oral Daily  . neomycin-bacitracin-polymyxin   Topical Daily  . pneumococcal 23 valent vaccine  0.5 mL Intramuscular Tomorrow-1000  . sevelamer carbonate  2,400 mg Oral TID WC  . sodium chloride flush  3 mL Intravenous Q12H   Continuous Infusions: . sodium chloride 10 mL/hr at 10/09/15 1447   PRN Meds: acetaminophen **OR** acetaminophen, guaiFENesin-codeine, guaiFENesin-dextromethorphan, hydrALAZINE, hydrocortisone cream, lactulose, lip balm, loratadine, MUSCLE RUB, nitroGLYCERIN, ondansetron **OR** ondansetron (ZOFRAN) IV, phenol, polyvinyl alcohol, sodium chloride, witch hazel-glycerin  Time spent: 30 minutes  Author: Berle Mull, MD [facility] Pager: [phone] 10/10/2015 5:37 PM  If 7PM-7AM, please contact night-coverage at [email or web address], password [facility] [place]

## 2015-10-11 LAB — RENAL FUNCTION PANEL
ALBUMIN: 1.6 g/dL — AB (ref 3.5–5.0)
ANION GAP: 17 — AB (ref 5–15)
BUN: 32 mg/dL — AB (ref 6–20)
CALCIUM: 8.5 mg/dL — AB (ref 8.9–10.3)
CO2: 23 mmol/L (ref 22–32)
CREATININE: 6.09 mg/dL — AB (ref 0.44–1.00)
Chloride: 100 mmol/L — ABNORMAL LOW (ref 101–111)
GFR calc Af Amer: 8 mL/min — ABNORMAL LOW (ref >60)
GFR calc non Af Amer: 7 mL/min — ABNORMAL LOW (ref >60)
GLUCOSE: 83 mg/dL (ref 65–99)
PHOSPHORUS: 5.4 mg/dL — AB (ref 2.5–4.6)
Potassium: 5.7 mmol/L — ABNORMAL HIGH (ref 3.5–5.1)
SODIUM: 140 mmol/L (ref 135–145)

## 2015-10-11 LAB — GLUCOSE, CAPILLARY
GLUCOSE-CAPILLARY: 119 mg/dL — AB (ref 65–99)
GLUCOSE-CAPILLARY: 111 mg/dL — AB (ref 65–99)
Glucose-Capillary: 88 mg/dL (ref 65–99)

## 2015-10-11 LAB — CBC
HCT: 25.4 % — ABNORMAL LOW (ref 36.0–46.0)
Hemoglobin: 8 g/dL — ABNORMAL LOW (ref 12.0–15.0)
MCH: 26.5 pg (ref 26.0–34.0)
MCHC: 31.5 g/dL (ref 30.0–36.0)
MCV: 84.1 fL (ref 78.0–100.0)
PLATELETS: 201 10*3/uL (ref 150–400)
RBC: 3.02 MIL/uL — ABNORMAL LOW (ref 3.87–5.11)
RDW: 14.6 % (ref 11.5–15.5)
WBC: 10.7 10*3/uL — ABNORMAL HIGH (ref 4.0–10.5)

## 2015-10-11 LAB — PREPARE RBC (CROSSMATCH)

## 2015-10-11 LAB — CK: CK TOTAL: 125 U/L (ref 38–234)

## 2015-10-11 MED ORDER — SODIUM CHLORIDE 0.9 % IV SOLN
Freq: Once | INTRAVENOUS | Status: AC
  Administered 2015-10-11: 18:00:00 via INTRAVENOUS

## 2015-10-11 MED ORDER — SODIUM POLYSTYRENE SULFONATE 15 GM/60ML PO SUSP
15.0000 g | Freq: Once | ORAL | Status: AC
  Administered 2015-10-11: 15 g via ORAL
  Filled 2015-10-11: qty 60

## 2015-10-11 NOTE — Progress Notes (Signed)
Physical Therapy Treatment Patient Details Name: [name] MRN: [id number] DOB: [birth year] Today's Date: 10/11/2015    History of Present Illness 60 year old female presenting with 10 days of progressive dyspnea. PMH is significant for CKD, HTN, T2DM, and seizure disorder    PT Comments    Pt able to achieve full upright stance today. She fatigues quickly. Still requires maximove for bed <->chair. Pt on RA during Rx. Sats ranged from 95%-99% and HR 102-108.  Follow Up Recommendations  SNF;Supervision/Assistance - 24 hour     Equipment Recommendations  None recommended by PT    Recommendations for Other Services       Precautions / Restrictions Precautions Precautions: Fall    Mobility  Bed Mobility     Rolling: Mod assist;+2 for physical assistance Sidelying to sit: Max assist;+2 for physical assistance   Sit to supine: Max assist;+2 for physical assistance   General bed mobility comments: Pt able to assist using BUE and rails. Pt able to bridge hips in bed.  Transfers   Equipment used: None (bari-chair used in front of pt)   Sit to Stand: +2 physical assistance;Max assist         General transfer comment: Sit to stand from EOB x 2. Pt able to achieve full upright stance on 2nd attempt and maintain x 3 seconds.  Ambulation/Gait                 Stairs            Wheelchair Mobility    Modified Rankin (Stroke Patients Only)       Balance Overall balance assessment: Needs assistance Sitting-balance support: No upper extremity supported;Feet supported Sitting balance-Leahy Scale: Good     Standing balance support: Bilateral upper extremity supported;During functional activity Standing balance-Leahy Scale: Poor                      Cognition Arousal/Alertness: Awake/alert Behavior During Therapy: WFL for tasks assessed/performed Overall Cognitive Status: Within Functional Limits for tasks assessed                       Exercises      General Comments        Pertinent Vitals/Pain Pain Assessment: Faces Pain Location: "I'm just sore from being in bed."    Home Living                      Prior Function            PT Goals (current goals can now be found in the care plan section) Acute Rehab PT Goals Patient Stated Goal: be able to return home PT Goal Formulation: With patient Time For Goal Achievement: 10/23/15 Potential to Achieve Goals: Fair Progress towards PT goals: Progressing toward goals    Frequency  Min 2X/week    PT Plan Current plan remains appropriate    Co-evaluation             End of Session Equipment Utilized During Treatment: Gait belt Activity Tolerance: Patient limited by fatigue Patient left: in bed;with call bell/phone within reach     Time: 0839-0906 PT Time Calculation (min) (ACUTE ONLY): 27 min  Charges:  $Therapeutic Activity: 23-37 mins                    G Codes:      [name] 10/11/2015, 9:13 AM

## 2015-10-11 NOTE — Care Management Important Message (Signed)
Important Message  Patient Details  Name: [name] MRN: [id number] Date of Birth: Jul 24, 1955   Medicare Important Message Given:  Yes    Barb Merino Berley Gambrell 10/11/2015, 2:36 PM

## 2015-10-11 NOTE — Care Management (Signed)
Case manager called [facility] with [facility] to discuss referral, patient is not a candidate fro LTAC at this time.

## 2015-10-11 NOTE — NC FL2 (Signed)
[place] MEDICAID FL2 LEVEL OF CARE SCREENING TOOL     IDENTIFICATION  Patient Name: [name] Birthdate: 01/23/1956 Sex: female Admission Date (Current Location): 10/04/2015  [facility] and [place] Number:  Herbalist and Address:  [facility] [place] [address] [place], [place] [address]      Provider Number: [id number]  Attending Physician Name and Address:  Lavina Hamman, MD  Relative Name and Phone Number:  Chantella Avara - sister.  Phone number [phone] (mobile)    Current Level of Care: Hospital Recommended Level of Care: [place] Prior Approval Number:    Date Approved/Denied:   PASRR Number: [id number] (Eff. 10/11/15)  Discharge Plan: SNF    Current Diagnoses: Patient Active Problem List   Diagnosis Date Noted  . Hypoglycemia 10/07/2015  . Occult blood positive stool 10/07/2015  . Chronic anemia   . Elevated troponin 10/06/2015  . Renal failure 10/05/2015  . Acute pulmonary edema ([place]) 10/05/2015  . Renal failure (ARF), acute on chronic (HCC) 10/05/2015  . Anemia in chronic kidney disease 10/05/2015  . Type 2 diabetes mellitus with renal complication ([place]) [id number]  . Hypertension 10/05/2015  . Hyperkalemia   . Bilateral leg edema 10/03/2015  . DOE (dyspnea on exertion) 10/03/2015  . Morbid obesity ([place]) 10/03/2015  . CKD (chronic kidney disease) stage 4, GFR 15-29 ml/min (HCC) 05/04/2015    Orientation RESPIRATION BLADDER Height & Weight     Self, Time, Situation, Place  Normal Incontinent Weight: (!) 388 lb 0.2 oz (176 kg) Height:  5\' 6"  (167.6 cm)  BEHAVIORAL SYMPTOMS/MOOD NEUROLOGICAL BOWEL NUTRITION STATUS    Convulsions/Seizures (Patient has medical hx of seizures) Continent Diet (Renal carb modified with fluid restriction)  AMBULATORY STATUS COMMUNICATION OF NEEDS Skin   Extensive Assist (Patient unable to ambulate during PT eval on 5/9 or 5/11. Max assist with transfers.) Verbally Other  (Comment) (Wound care nurse consulted and per note-"skin folds beneath abd and breasts as being red, macerated, and moist with partial thickness skin loss. Appearance is consistent with intertrigo.This product should remain in place for 5 days)                       Personal Care Assistance Level of Assistance  Bathing, Feeding, Dressing Bathing Assistance: Maximum assistance Feeding assistance: Independent Dressing Assistance: Maximum assistance     Functional Limitations Info  Sight, Hearing, Speech Sight Info: Adequate Hearing Info: Adequate Speech Info: Adequate    SPECIAL CARE FACTORS FREQUENCY  PT (By licensed PT)     PT Frequency: Evaluated 5/9. A minimum of 2X per week therapy recommended.              Contractures      Additional Factors Info  Code Status, Allergies Code Status Info: Full Allergies Info: No known allergies           Current Medications (10/11/2015):  This is the current hospital active medication list Current Facility-Administered Medications  Medication Dose Route Frequency Provider Last Rate Last Dose  . 0.9 %  sodium chloride infusion   Intravenous Once Janece Canterbury, MD      . 0.9 %  sodium chloride infusion   Intravenous Continuous Lillia Abed, MD 10 mL/hr at 10/09/15 1447    . acetaminophen (TYLENOL) tablet 650 mg  650 mg Oral Q6H PRN Rise Patience, MD   650 mg at 10/10/15 1830   Or  . acetaminophen (TYLENOL) suppository 650  mg  650 mg Rectal Q6H PRN Rise Patience, MD      . amLODipine (NORVASC) tablet 10 mg  10 mg Oral QHS Rise Patience, MD   10 mg at 10/10/15 2301  . aspirin EC tablet 81 mg  81 mg Oral Daily Rise Patience, MD   81 mg at 10/11/15 1002  . atorvastatin (LIPITOR) tablet 40 mg  40 mg Oral Daily Rise Patience, MD   40 mg at 10/11/15 1002  . Darbepoetin Alfa (ARANESP) injection 100 mcg  100 mcg Intravenous Q Wed-HD Jamal Maes, MD   100 mcg at 10/10/15 1044  . doxercalciferol  (HECTOROL) injection 1 mcg  1 mcg Intravenous Q M,W,F-HD Jamal Maes, MD   1 mcg at 10/10/15 1044  . febuxostat (ULORIC) tablet 40 mg  40 mg Oral Daily Rise Patience, MD   40 mg at 10/11/15 1002  . ferric gluconate (NULECIT) 125 mg in sodium chloride 0.9 % 100 mL IVPB  125 mg Intravenous Q M,W,F-HD Jamal Maes, MD   125 mg at 10/10/15 1044  . guaiFENesin-codeine 100-10 MG/5ML solution 5 mL  5 mL Oral Q4H PRN Janece Canterbury, MD   5 mL at 10/08/15 2349  . guaiFENesin-dextromethorphan (ROBITUSSIN DM) 100-10 MG/5ML syrup 5 mL  5 mL Oral Q4H PRN Gardiner Barefoot, NP      . heparin injection 5,000 Units  5,000 Units Subcutaneous Q8H Janece Canterbury, MD   5,000 Units at 10/11/15 1310  . hydrALAZINE (APRESOLINE) injection 10 mg  10 mg Intravenous Q4H PRN Rise Patience, MD      . hydrocortisone cream 1 % 1 application  1 application Topical TID PRN Gardiner Barefoot, NP      . lactulose (CHRONULAC) 10 GM/15ML solution 10 g  10 g Oral BID PRN Janece Canterbury, MD      . lip balm (BLISTEX) ointment   Topical PRN Lavina Hamman, MD      . loratadine (CLARITIN) tablet 10 mg  10 mg Oral Daily PRN Gardiner Barefoot, NP   10 mg at 10/10/15 1830  . metoprolol succinate (TOPROL-XL) 24 hr tablet 50 mg  50 mg Oral BID Estanislado Emms, MD   50 mg at 10/11/15 1002  . multivitamin with minerals tablet 1 tablet  1 tablet Oral Daily Rise Patience, MD   1 tablet at 10/11/15 1002  . MUSCLE RUB CREA 1 application  1 application Topical PRN Gardiner Barefoot, NP   1 application at 0000000 1834  . neomycin-bacitracin-polymyxin (NEOSPORIN) ointment   Topical Daily Janece Canterbury, MD      . nitroGLYCERIN (NITROSTAT) SL tablet 0.4 mg  0.4 mg Sublingual Q5 min PRN Janece Canterbury, MD      . ondansetron [facility]) tablet 4 mg  4 mg Oral Q6H PRN Rise Patience, MD   4 mg at 10/07/15 1854   Or  . ondansetron (ZOFRAN) injection 4 mg  4 mg Intravenous Q6H PRN Rise Patience, MD      .  phenol (CHLORASEPTIC) mouth spray 1 spray  1 spray Mouth/Throat PRN Gardiner Barefoot, NP      . polyvinyl alcohol (LIQUIFILM TEARS) 1.4 % ophthalmic solution 1 drop  1 drop Both Eyes PRN Gardiner Barefoot, NP      . sevelamer carbonate (RENVELA) tablet 2,400 mg  2,400 mg Oral TID WC Jamal Maes, MD   2,400 mg at 10/11/15 1242  . sodium chloride (OCEAN) 0.65 %  nasal spray 1 spray  1 spray Each Nare PRN Gardiner Barefoot, NP      . sodium chloride flush (NS) 0.9 % injection 3 mL  3 mL Intravenous Q12H Rise Patience, MD   3 mL at 10/11/15 1000  . witch hazel-glycerin (TUCKS) pad 1 application  1 application Topical PRN Gardiner Barefoot, NP         Discharge Medications: Please see discharge summary for a list of discharge medications.  Relevant Imaging Results:  Relevant Lab Results:   Additional Information [id number]. Patient has incisions to right neck and right chest.  Right IJ 3 cm Palindrome catheter placed on 5/9. Patient has been declared ESRD but not set-up for diallysis at a local center yet.Sable Feil, LCSW

## 2015-10-11 NOTE — Progress Notes (Signed)
[facility] Rounding Note  Subjective: Denies SOB,  Chest pain. States she has worked with PT.  Reports left arm swelling, non-tender, that she just noticed this AM- this is where her left AVF was noted to be infiltrated with significant hematoma (and that swelling has been there since the AVF was infiltrated last week)    Physical Exam: Filed Vitals:   10/10/15 1742 10/10/15 2028  BP: 144/67 153/70  Pulse: 93 92  Temp: 98.7 F (37.1 C) 98.7 F (37.1 C)  Resp: 18 19     General: 60 year old female, lying in hospital bed HEENT: MMM Heart: Distant heart sounds. RRR, no murmurs noted. Lungs: Limited secondary to patient's habitus. Distant breath sounds, but air movement noted bilaterally.  Abdomen: Obese, S, NT, ND Extremities: lower extremity edema noted with chronic venous stasis changes. Firm swelling of left upper arm, approx 3 cm in diameter, non tender, non fluctant, no overlying skin changes or warmth Skin: Warm, dry Neuro: alert, no focal deficits  R IJ TDC (5/9) for HD.  Left arm AVF with hematoma from last week's infiltration  Weight 10/04/15   186 kg 5/5        188 kg 5/6        188.2 kg 5/7        180 kg 5/8        176 kg 5/10      176 <<180 kg   Results for COLLINS, LYTER (MRN [id number]) as of 10/08/2015 17:49  Ref. Range 10/07/2015 03:55  Iron Latest Ref Range: 28-170 ug/dL 30  UIBC Latest Units: ug/dL 88  TIBC Latest Ref Range: 250-450 ug/dL 118 (L)  Saturation Ratios Latest Ref Range: 10.4-31.8 % 26     Recent Labs  10/10/15 0512 10/11/15 0559  NA 141 140  K 4.3 5.7*  CL 100* 100*  CO2 26 23  GLUCOSE 96 83  BUN 46* 32*  CREATININE 8.25* 6.09*  CALCIUM 8.1* 8.5*  PHOS 8.3* 5.4*     Recent Labs  10/10/15 0512 10/11/15 0559  ALBUMIN 1.5* 1.6*   Lab Results  Component Value Date   PTH 570* 10/07/2015   PTH Comment 10/07/2015   CALCIUM 8.5* 10/11/2015   CAION 1.07* 10/05/2015   PHOS 5.4* 10/11/2015      Recent Labs  10/09/15 0727 10/10/15 0518  WBC 9.8 8.6  HGB 8.2* 8.1*  HCT 25.1* 24.8*  MCV 85.1 85.5  PLT 202 187    Dg Chest Port 1 View  10/09/2015  CLINICAL DATA:  Dialysis catheter insertion EXAM: PORTABLE CHEST 1 VIEW COMPARISON:  10/08/2015 FINDINGS: Right IJ dialysis catheter tips SVC RA junction. No pneumothorax or effusion. Stable cardiomegaly and central vascular congestion. No focal pneumonia, collapse or consolidation. Degenerative changes of the spine. Stable hilar prominence as before. IMPRESSION: Right IJ dialysis catheter tips SVC RA junction. Stable cardiomegaly and central vascular congestion No complicating feature. Electronically Signed   By: Jerilynn Mages.  Shick M.D.   On: 10/09/2015 16:25   Dg Chest Port 1 View  10/08/2015  CLINICAL DATA:  Cough EXAM: PORTABLE CHEST 1 VIEW COMPARISON:  10/05/2015 FINDINGS: Cardiomegaly again noted. Central mild vascular congestion without convincing pulmonary edema. Mild left basilar atelectasis. Right IJ central line is unchanged in position. IMPRESSION: Central mild vascular congestion without convincing pulmonary edema. Mild left basilar atelectasis. Cardiomegaly again noted. Electronically Signed   By: Lahoma Crocker M.D.   On: 10/08/2015 08:16   Medications . sodium chloride  Intravenous Once  . amLODipine  10 mg Oral QHS  . aspirin EC  81 mg Oral Daily  . atorvastatin  40 mg Oral Daily  . darbepoetin (ARANESP) injection - DIALYSIS  100 mcg Intravenous Q Wed-HD  . doxercalciferol  1 mcg Intravenous Q M,W,F-HD  . febuxostat  40 mg Oral Daily  . ferric gluconate (FERRLECIT/NULECIT) IV  125 mg Intravenous Q M,W,F-HD  . heparin subcutaneous  5,000 Units Subcutaneous Q8H  . metoprolol succinate  50 mg Oral BID  . multivitamin with minerals  1 tablet Oral Daily  . neomycin-bacitracin-polymyxin   Topical Daily  . sevelamer carbonate  2,400 mg Oral TID WC  . sodium chloride flush  3 mL Intravenous Q12H   Background 60 y.o. female with medical history  significant of chronic kidney disease stage IV to 5, diabetes mellitus, hypertension presented to the ER with progressive wt gain (>50 lb), SOB. CXR pulm edema, K >7. Had urgent HD initiated 5/6 via temp cath, (has AVF that is too deep to be usable and was infiltrated with attempt to use). New ESRD with outpt planning underway, mobility biggest deterrent to outpt HD. HD initiated 10/05/15  Assessment/Plan: 1. AKI on CKD4 ->new ESRD.  Temporary catheter placed 5/6. Last HD 5/10 after Diatek placement by [facility] (AVF to be superficialized at a later date - see their note) CLIP process held as there is concern she cannot tolerate outpatient HD given immobility. LTAC would be more appropriate but am told LTAC may NOT be possibility. She will have to be able to sit in a chair for 6-7 hours (and HD in a chair) in order to be suitable for outpt HD.  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 PTH elevated to 570.Starting VDRA with HD (not sure if she will go to a unit that uses hectorol or calcitriol - for now will use hectorol 1 mcg TIW with HD 3. Hyperkalemia.  Unclear to me why K up to 5.7 since just had HD yesterday. Give dose of kayexalate today 15 gm.  4. Normocytic Anemia. Hg 8.2. Monitor CBC. Continue Aranesp 100 QWED and getting load of iron with HD 5. Gout. On uloric.  6. HTN. On metoprolol, clonidine, and amlodipine 7. T2DM. On SSI.  8. HLD. On statin and aspirin per primary.  9. Severe immobility - was walking PTA (sister confirms pt attended church week before admission), now with significantly limited mobility. Will need to follow with PT inpatient. May benefit from [facility] on discharge 10. Left arm fistula: patient noted where fistula has infiltrated, no concern for acute pathology will be having superficialiazation of AVF in a couple of weeks per [facility] once hematoma resolves  Haney,Alyssa 10/11/2015, 9:25 AM   I have seen and examined this patient and agree with  plan and assessment in the above note. As for disposition, she will have to be able to sit in a chair for HD (so will need to be able to sit for 6-7 hours) and if cannot transfer - at her current weight may be too large for the [facility] lift (weight limit 400 lb). Will resume CLIOP process but please understand these placement limitations. Dawnn Nam B,MD 10/11/2015 1:59 PM

## 2015-10-11 NOTE — Progress Notes (Signed)
[facility] Progress Note  Patient: [name] [id number]   PCP: Dwan Bolt, MD DOB: 1956/03/12   DOA: 10/04/2015   DOS: 10/11/2015   Date of Service: the patient was seen and examined on 10/11/2015  Subjective: Patient herself denies any acute complaint but continues to remain significantly lethargic and unable to sit in the chair or sitting up on her own in the bed. Denies any focal deficit Nutrition: Tolerating oral diet  Brief hospital course: Patient was admitted on 10/04/2015, with complaint of shortness of breath progressively worsening without any chest pain or cough or fever or chills, was found to have acute on chronic diastolic dysfunction with elevated troponin and elevated creatinine. Patient also had hyperkalemia with metabolic acidosis. Patient was treated conservatively with Lasix and then transferred to [facility] for hemodialysis Currently further plan is continue hemodialysis and arrange for outpatient discharge HD.  Assessment and Plan: 1. Acute pulmonary edema (HCC) Suspected acute on chronic diastolic dysfunction. Acute on chronic kidney disease stage IV New ESRD Acute hypoxic respiratory failure  Oxygenation is significantly improving with fluid removal. Initially attempted with the Lasix but did not respond and currently on hemodialysis. Left arm fistula placed in 07/25/2015. Unable to use at present. Tunneled HD catheter on May 9. Hemodialysis started on May 5 with temporary catheter and will need lifelong hemodialysis an outpatient HD clip Patient has been having significant denies weakness and therefore will most likely unable to tolerate outpatient hemodialysis.  2. Hyperkalemia. Potassium was 7.1 on admission currently resolved with hemodialysis.  3. Elevated troponin. Troponin was 11 on May 6. Which is likely a false positive. Echocardiogram does not show any acute wall motion abnormality EKG does not show any acute ischemic  changes. Currently on aspirin and statin and beta blocker. Cardiology was consulted and appreciate input.  4. Acute encephalopathy. Currently resolving. Avoid sedating medications.  5. Essential hypertension. Blood pressure getting stable after hemodialysis initiation. Continue amlodipine and metoprolol.  6. Anemia of chronic kidney disease. Iron studies, folic acid, TSH within normal limits. B-12 elevated. No active bleeding. Transfused 1 unit on May of 9. I would transfuse one unit due to significant weakness and monitor clinically. Monitor serial H&H.  7. Generalized fatigue and weakness with physical deconditioning. CPK normal not on any psychotropic medication. Phosphorus levels adequately elevated. TSH cortisol level unremarkable. Hemodynamically stable. At present the etiology of her generalized fatigue and weakness is most likely physical deconditioning. Patient will need significant physical therapy. Unsure regarding patient's discharge plan since it is unlikely that the patient will be able to tolerate hemodialysis given her physical condition as an outpatient. Discussed with social worker regarding other avenues for discharge  Pain management: Tylenol when necessary Activity: physical therapy SNF Bowel regimen: last BM 10/07/2015 stool soft added Diet: Renal diet DVT Prophylaxis: subcutaneous Heparin  Advance goals of care discussion: Full code  Family Communication: no family was present at bedside, at the time of interview.   Disposition:  Discharge to SNF pending clip Expected discharge date: 10/14/2015   Consultants: Nephrology, vascular surgery Procedures: Hemodialysis, TDC placement  Antibiotics: Anti-infectives    Start     Dose/Rate Route Frequency Ordered Stop   10/08/15 1515  cefUROXime (ZINACEF) 1.5 g in dextrose 5 % 50 mL IVPB     1.5 g 100 mL/hr over 30 Minutes Intravenous To Surgery 10/08/15 1500 10/09/15 1515        Intake/Output Summary  (Last 24 hours) at 10/11/15 1900 Last data filed at  10/11/15 1800  Gross per 24 hour  Intake   1134 ml  Output      0 ml  Net   1134 ml   Filed Weights   10/08/15 0144 10/10/15 0810 10/10/15 1211  Weight: 176 kg (388 lb 0.2 oz) 180 kg (396 lb 13.3 oz) 176 kg (388 lb 0.2 oz)    Objective: Physical Exam: Filed Vitals:   10/11/15 0916 10/11/15 1000 10/11/15 1710 10/11/15 1738  BP:  137/82 147/62 143/75  Pulse:  97 89 90  Temp:  98.2 F (36.8 C) 98.2 F (36.8 C) 98.1 F (36.7 C)  TempSrc:  Oral Oral Oral  Resp:  18 18 18   Height:      Weight:      SpO2: 96% 98% 97% 99%   General: Alert, Awake and Oriented to Time, Place and Person. Appear in mild distress Eyes: PERRL, Conjunctiva normal ENT: Oral Mucosa clear moist. Cardiovascular: S1 and S2 Present, aortic systolic Murmur, Peripheral Pulses Present Respiratory: Bilateral Air entry equal and Decreased,  Clear to Auscultation, no Crackles, no wheezes Abdomen: Bowel Sound present, Soft and no tenderness Extremities: trace Pedal edema, no calf tenderness Neurologic: Grossly no focal neuro deficit. Bilaterally Equal motor strength  Data Reviewed: CBC:  Recent Labs Lab 10/05/15 0100 10/05/15 0819  10/07/15 0620 10/08/15 0600 10/09/15 0727 10/10/15 0518 10/11/15 1030  WBC 11.3* 11.0*  < > 10.4 10.6* 9.8 8.6 10.7*  NEUTROABS 8.2* 8.6*  --  7.7  --   --   --   --   HGB 8.0* 7.7*  < > 7.6* 7.2* 8.2* 8.1* 8.0*  HCT 24.1* 23.9*  < > 23.1* 22.2* 25.1* 24.8* 25.4*  MCV 80.9 81.6  < > 81.1 81.6 85.1 85.5 84.1  PLT 247 253  < > 189 205 202 187 201  < > = values in this interval not displayed. Basic Metabolic Panel:  Recent Labs Lab 10/07/15 0355 10/08/15 0257 10/09/15 0727 10/10/15 0512 10/11/15 0559  NA 142 141 139 141 140  K 4.2 5.3* 4.2 4.3 5.7*  CL 104 103 100* 100* 100*  CO2 22 22 24 26 23   GLUCOSE 88 96 98 96 83  BUN 55* 69* 37* 46* 32*  CREATININE 9.33* 10.32* 6.76* 8.25* 6.09*  CALCIUM 7.9*  7.7* 7.9*  8.2* 8.1* 8.5*  PHOS 7.2* 9.4* 7.0* 8.3* 5.4*    Liver Function Tests:  Recent Labs Lab 10/05/15 0819 10/05/15 1030  10/07/15 0355 10/08/15 0257 10/09/15 0727 10/10/15 0512 10/11/15 0559  AST 21  --   --   --   --   --   --   --   ALT 16 14  --   --   --   --   --   --   ALKPHOS 84  --   --   --   --   --   --   --   BILITOT 0.6  --   --   --   --   --   --   --   PROT 6.3*  --   --   --   --   --   --   --   ALBUMIN 2.0* 1.8*  < > 1.5* 1.6* 1.6* 1.5* 1.6*  < > = values in this interval not displayed. No results for input(s): LIPASE, AMYLASE in the last 168 hours.  Recent Labs Lab 10/08/15 0240  AMMONIA 43*   Coagulation Profile: No results for input(s):  INR, PROTIME in the last 168 hours. Cardiac Enzymes:  Recent Labs Lab 10/05/15 0135 10/05/15 0819 10/06/15 0930 10/06/15 1438 10/06/15 2130 10/11/15 1557  CKTOTAL  --   --   --   --   --  125  TROPONINI 0.03 0.11* 11.23* 0.06* 0.06*  --    BNP (last 3 results) No results for input(s): PROBNP in the last 8760 hours.  CBG:  Recent Labs Lab 10/10/15 0740 10/10/15 1702 10/10/15 2022 10/11/15 0750 10/11/15 1119  GLUCAP 94 113* 142* 88 119*    Studies: No results found.   Scheduled Meds: . amLODipine  10 mg Oral QHS  . aspirin EC  81 mg Oral Daily  . atorvastatin  40 mg Oral Daily  . darbepoetin (ARANESP) injection - DIALYSIS  100 mcg Intravenous Q Wed-HD  . doxercalciferol  1 mcg Intravenous Q M,W,F-HD  . febuxostat  40 mg Oral Daily  . ferric gluconate (FERRLECIT/NULECIT) IV  125 mg Intravenous Q M,W,F-HD  . heparin subcutaneous  5,000 Units Subcutaneous Q8H  . metoprolol succinate  50 mg Oral BID  . multivitamin with minerals  1 tablet Oral Daily  . neomycin-bacitracin-polymyxin   Topical Daily  . sevelamer carbonate  2,400 mg Oral TID WC  . sodium chloride flush  3 mL Intravenous Q12H   Continuous Infusions:   PRN Meds: acetaminophen **OR** acetaminophen, hydrALAZINE, hydrocortisone cream,  lactulose, lip balm, nitroGLYCERIN, ondansetron **OR** ondansetron (ZOFRAN) IV  Time spent: 30 minutes  Author: Berle Mull, MD [facility] Pager: [phone] 10/11/2015 7:00 PM  If 7PM-7AM, please contact night-coverage at [email or web address], password [facility]

## 2015-10-12 DIAGNOSIS — G4733 Obstructive sleep apnea (adult) (pediatric): Secondary | ICD-10-CM

## 2015-10-12 LAB — COMPREHENSIVE METABOLIC PANEL
ALT: 41 U/L (ref 14–54)
AST: 50 U/L — AB (ref 15–41)
Albumin: 1.6 g/dL — ABNORMAL LOW (ref 3.5–5.0)
Alkaline Phosphatase: 129 U/L — ABNORMAL HIGH (ref 38–126)
Anion gap: 16 — ABNORMAL HIGH (ref 5–15)
BUN: 33 mg/dL — ABNORMAL HIGH (ref 6–20)
CHLORIDE: 99 mmol/L — AB (ref 101–111)
CO2: 25 mmol/L (ref 22–32)
Calcium: 8.2 mg/dL — ABNORMAL LOW (ref 8.9–10.3)
Creatinine, Ser: 7.55 mg/dL — ABNORMAL HIGH (ref 0.44–1.00)
GFR, EST AFRICAN AMERICAN: 6 mL/min — AB (ref >60)
GFR, EST NON AFRICAN AMERICAN: 5 mL/min — AB (ref >60)
Glucose, Bld: 101 mg/dL — ABNORMAL HIGH (ref 65–99)
POTASSIUM: 4 mmol/L (ref 3.5–5.1)
SODIUM: 140 mmol/L (ref 135–145)
Total Bilirubin: 0.4 mg/dL (ref 0.3–1.2)
Total Protein: 5.7 g/dL — ABNORMAL LOW (ref 6.5–8.1)

## 2015-10-12 LAB — GLUCOSE, CAPILLARY
GLUCOSE-CAPILLARY: 101 mg/dL — AB (ref 65–99)
GLUCOSE-CAPILLARY: 114 mg/dL — AB (ref 65–99)
GLUCOSE-CAPILLARY: 142 mg/dL — AB (ref 65–99)

## 2015-10-12 LAB — CBC WITH DIFFERENTIAL/PLATELET
BASOS ABS: 0 10*3/uL (ref 0.0–0.1)
Basophils Relative: 0 %
EOS PCT: 4 %
Eosinophils Absolute: 0.5 10*3/uL (ref 0.0–0.7)
HCT: 31.7 % — ABNORMAL LOW (ref 36.0–46.0)
HEMOGLOBIN: 10.2 g/dL — AB (ref 12.0–15.0)
LYMPHS PCT: 11 %
Lymphs Abs: 1.3 10*3/uL (ref 0.7–4.0)
MCH: 28.3 pg (ref 26.0–34.0)
MCHC: 32.2 g/dL (ref 30.0–36.0)
MCV: 88.1 fL (ref 78.0–100.0)
MONOS PCT: 6 %
Monocytes Absolute: 0.7 10*3/uL (ref 0.1–1.0)
Neutro Abs: 9.5 10*3/uL — ABNORMAL HIGH (ref 1.7–7.7)
Neutrophils Relative %: 79 %
Platelets: 240 10*3/uL (ref 150–400)
RBC: 3.6 MIL/uL — AB (ref 3.87–5.11)
RDW: 14.4 % (ref 11.5–15.5)
WBC: 12 10*3/uL — AB (ref 4.0–10.5)

## 2015-10-12 LAB — TYPE AND SCREEN
ABO/RH(D): B POS
ANTIBODY SCREEN: NEGATIVE
UNIT DIVISION: 0

## 2015-10-12 LAB — MAGNESIUM: MAGNESIUM: 2.1 mg/dL (ref 1.7–2.4)

## 2015-10-12 LAB — PREPARE RBC (CROSSMATCH)

## 2015-10-12 LAB — PHOSPHORUS: PHOSPHORUS: 5.6 mg/dL — AB (ref 2.5–4.6)

## 2015-10-12 MED ORDER — SODIUM CHLORIDE 0.9 % IV SOLN: Freq: Once | INTRAVENOUS | Status: DC

## 2015-10-12 MED ORDER — LACTULOSE 10 GM/15ML PO SOLN
10.0000 g | Freq: Two times a day (BID) | ORAL | Status: DC
  Administered 2015-10-12 – 2015-10-15 (×7): 10 g via ORAL
  Filled 2015-10-12 (×9): qty 15

## 2015-10-12 MED ORDER — SENNOSIDES-DOCUSATE SODIUM 8.6-50 MG PO TABS
1.0000 | ORAL_TABLET | Freq: Two times a day (BID) | ORAL | Status: DC
  Administered 2015-10-12 – 2015-10-15 (×6): 1 via ORAL
  Filled 2015-10-12 (×6): qty 1

## 2015-10-12 MED ORDER — DOXERCALCIFEROL 4 MCG/2ML IV SOLN
INTRAVENOUS | Status: AC
  Administered 2015-10-12: 1 ug via INTRAVENOUS
  Filled 2015-10-12: qty 2

## 2015-10-12 MED ORDER — POLYETHYLENE GLYCOL 3350 17 G PO PACK
17.0000 g | PACK | Freq: Every day | ORAL | Status: DC
  Administered 2015-10-13 – 2015-10-14 (×2): 17 g via ORAL
  Filled 2015-10-12 (×2): qty 1

## 2015-10-12 NOTE — Progress Notes (Signed)
Occupational Therapy Treatment Patient Details Name: [name] MRN: [id number] DOB: 07/21/1955 Today's Date: 10/12/2015    History of present illness 60 year old female presenting with 10 days of progressive dyspnea. PMH is significant for CKD, HTN, T2DM, and seizure disorder   OT comments  Pt progressing towards acute OT goals. Focus of session was sitting EOB to complete UB bathing. Also issued level 1 theraband and instructed in UB strengthening exercises. Pt reporting some dizziness EOB. D/c plan remains appropriate.    Follow Up Recommendations  SNF;Supervision/Assistance - 24 hour    Equipment Recommendations  3 in 1 bedside comode;Tub/shower bench (bariatric)    Recommendations for Other Services      Precautions / Restrictions Precautions Precautions: Fall Restrictions Weight Bearing Restrictions: No       Mobility Bed Mobility Overal bed mobility: +2 for physical assistance;Needs Assistance Bed Mobility: Rolling;Sidelying to Sit;Sit to Supine Rolling: Mod assist;+2 for physical assistance Sidelying to sit: Mod assist;+2 for physical assistance   Sit to supine: Mod assist;+2 for physical assistance   General bed mobility comments: Cues for technique. assist to power UE across body to reach bed rail (body habitus). Pt able to "walk" feet over close to EOB asssit to fully get feet into position. Assist to advance BLE off bed and to powerup trunk. Returning to supine assist to advance BLE and to control descent of upper body into supiner position. +2 max to scoot up in bed with pt able to use bed rails to assist once she was in a position to reach them.  Transfers                 General transfer comment: EOB only this session    Balance Overall balance assessment: Needs assistance Sitting-balance support: No upper extremity supported;Feet supported Sitting balance-Leahy Scale: Fair Sitting balance - Comments: sat EOB to complete UB bathing occasaional  BUE support to rest.        Standing balance comment: sat EOB about 12 minutes. + dizziness                   ADL Overall ADL's : Needs assistance/impaired         Upper Body Bathing: Minimal assitance;Sitting Upper Body Bathing Details (indicate cue type and reason): +2 to position EOB for UB bathing, + dizziness; therapist cleaned pt's back and provided cues for throughness.      Upper Body Dressing : Moderate assistance;Sitting Upper Body Dressing Details (indicate cue type and reason): donned hospital gown in sitting  position                    General ADL Comments: Pt sat EOB about 12 minutes to complete UB bathing and dressing tasks. Reported dizziness throughout EOB sitting. +2 to achieve EOB position but fair sitting balance EOB.       Vision                     Perception     Praxis      Cognition   Behavior During Therapy: WFL for tasks assessed/performed Overall Cognitive Status: Within Functional Limits for tasks assessed                       Extremity/Trunk Assessment               Exercises Other Exercises Other Exercises: issused theraband level one and instructed in BUE strengthening exercises with pt  providing return demonstration.    Shoulder Instructions       General Comments      Pertinent Vitals/ Pain       Pain Assessment: No/denies pain  Home Living                                          Prior Functioning/Environment              Frequency Min 2X/week     Progress Toward Goals  OT Goals(current goals can now be found in the care plan section)  Progress towards OT goals: Progressing toward goals  Acute Rehab OT Goals Patient Stated Goal: be able to return home OT Goal Formulation: With patient Time For Goal Achievement: 10/23/15 Potential to Achieve Goals: Good ADL Goals Pt Will Perform Upper Body Bathing: with set-up;sitting Pt Will Perform Upper Body Dressing:  with set-up;sitting Pt Will Transfer to Toilet: with +2 assist;with mod assist;bedside commode;stand pivot transfer Pt Will Perform Toileting - Clothing Manipulation and hygiene: with mod assist;with adaptive equipment;sitting/lateral leans Pt/caregiver will Perform Home Exercise Program: Both right and left upper extremity;With theraband;With Supervision  Plan Discharge plan remains appropriate    Co-evaluation                 End of Session Equipment Utilized During Treatment: Oxygen   Activity Tolerance Patient tolerated treatment well;Other (comment) (reported some dizziness EOB)   Patient Left in bed;with call bell/phone within reach   Nurse Communication Mobility status        Time: [id number] OT Time Calculation (min): 31 min  Charges: OT General Charges $OT Visit: 1 Procedure OT Treatments $Self Care/Home Management : 23-37 mins  Hortencia Pilar 10/12/2015, 10:35 AM

## 2015-10-12 NOTE — Progress Notes (Signed)
[name] Rounding Note  Subjective: Denies SOB,  Chest pain.  Moved to room with Harrel Lemon in the ceiling - wass too large for the conventional Hoyer   Physical Exam: Filed Vitals:   10/11/15 1954 10/12/15 0419  BP: 150/77 135/78  Pulse: 88 91  Temp: 98.8 F (37.1 C) 97.6 F (36.4 C)  Resp: 18 19     General: 60 year old female, initially sleeping with plate of breakfast her stomach HEENT: MMM Heart: Distant heart sounds. RRR, no murmurs noted. Lungs: Limited secondary to patient's habitus. Distant breath sounds, but air movement noted bilaterally.  Abdomen: Obese, S, NT, ND Extremities: lower extremity edema noted with chronic venous stasis changes. Firm swelling of left upper arm, approx 3 cm in diameter, non tender, non fluctant, no overlying skin changes or warmth- AVF with hematoma from last week's infiltration Skin: Warm, dry Neuro: alert, no focal deficits  R IJ TDC (5/9) for HD.    Weight 10/04/15   186 kg 5/5        188 kg 5/6        188.2 kg 5/7        180 kg 5/8        176 kg 5/10      176 <<180 kg   Results for Laura, Horn (MRN [id number]) as of 10/08/2015 17:49  Ref. Range 10/07/2015 03:55  Iron Latest Ref Range: 28-170 ug/dL 30  UIBC Latest Units: ug/dL 88  TIBC Latest Ref Range: 250-450 ug/dL 118 (L)  Saturation Ratios Latest Ref Range: 10.4-31.8 % 26     Recent Labs  10/10/15 0512 10/11/15 0559  NA 141 140  K 4.3 5.7*  CL 100* 100*  CO2 26 23  GLUCOSE 96 83  BUN 46* 32*  CREATININE 8.25* 6.09*  CALCIUM 8.1* 8.5*  PHOS 8.3* 5.4*     Recent Labs  10/10/15 0512 10/11/15 0559  ALBUMIN 1.5* 1.6*   Lab Results  Component Value Date   PTH 570* 10/07/2015   PTH Comment 10/07/2015   CALCIUM 8.5* 10/11/2015   CAION 1.07* 10/05/2015   PHOS 5.4* 10/11/2015      Recent Labs  10/10/15 0518 10/11/15 1030  WBC 8.6 10.7*  HGB 8.1* 8.0*  HCT 24.8* 25.4*  MCV 85.5 84.1  PLT 187 201    Dg Chest Port 1 View  10/09/2015   CLINICAL DATA:  Dialysis catheter insertion EXAM: PORTABLE CHEST 1 VIEW COMPARISON:  10/08/2015 FINDINGS: Right IJ dialysis catheter tips SVC RA junction. No pneumothorax or effusion. Stable cardiomegaly and central vascular congestion. No focal pneumonia, collapse or consolidation. Degenerative changes of the spine. Stable hilar prominence as before. IMPRESSION: Right IJ dialysis catheter tips SVC RA junction. Stable cardiomegaly and central vascular congestion No complicating feature. Electronically Signed   By: Jerilynn Mages.  Shick M.D.   On: 10/09/2015 16:25   Medications . amLODipine  10 mg Oral QHS  . aspirin EC  81 mg Oral Daily  . atorvastatin  40 mg Oral Daily  . darbepoetin (ARANESP) injection - DIALYSIS  100 mcg Intravenous Q Wed-HD  . doxercalciferol  1 mcg Intravenous Q M,W,F-HD  . febuxostat  40 mg Oral Daily  . ferric gluconate (FERRLECIT/NULECIT) IV  125 mg Intravenous Q M,W,F-HD  . heparin subcutaneous  5,000 Units Subcutaneous Q8H  . metoprolol succinate  50 mg Oral BID  . multivitamin with minerals  1 tablet Oral Daily  . neomycin-bacitracin-polymyxin   Topical Daily  . sevelamer carbonate  2,400 mg Oral TID WC  . sodium chloride flush  3 mL Intravenous Q12H   Background 60 y.o. 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 CXR pulm edema, K >7. Had urgent HD initiated 5/6 via temp cath, (has AVF that is too deep to be usable and was infiltrated with attempt to use). New ESRD with outpt planning underway, mobility biggest deterrent to outpt HD. HD initiated 10/05/15  Assessment/Plan: 1. AKI on CKD4 ->new ESRD.  Temporary catheter placed 5/6. Last HD 5/10 after Diatek placement by [facility] (AVF to be superficialized at a later date - see their note) CLIP process has been on hold as she is not a candidate for outpatient HD, but will try to find a place to accommodate her.  For HD today. 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 PTH elevated to 570.Starting VDRA with HD (not sure if she will go to a unit that uses hectorol or calcitriol - for now using hectorol 1 mcg TIW with HD 3. Hyperkalemia.- resolved K 4 today 5/12 after kayexalate yesterday for K 5.7 4. Normocytic Anemia. CBC today pending Monitor CBC. Continue Aranesp 100 QWED and getting load of iron with HD 5. Gout. On uloric.  6. HTN. On metoprolol, clonidine, and amlodipine 7. T2DM. On SSI.  8. HLD. On statin and aspirin per primary.  9. Severe immobility - was walking PTA (sister confirms pt attended church week before admission), now with significantly limited mobility. Will need to follow with PT inpatient. May benefit from [facility] on discharge 10. Left arm fistula: patient noted where fistula has infiltrated, no concern for acute pathology will be having superficialiazation of AVF in a couple of weeks per [facility] once hematoma resolves  [name],[name] 10/12/2015, 8:12 AM   I have seen and examined this patient and agree with plan and assessment in the above note. Currently is too large for standard Harrel Lemon (which is what we have at outpt HD units) - so moved to  "[place] in the ceiling room" and will try to HD in the [place] (but that chair also too wide for the doorways at out our local HD units and unlikely that she will fit in a standard "big boy" recliner. I have spoken with Operations Manager for [facility] units - who has her on the radar - but with present status will not be appropriate for our outpt units  [name] B,MD 10/12/2015 1:23 PM

## 2015-10-12 NOTE — Progress Notes (Signed)
[facility] Progress Note  Patient: [name] [id number]   PCP: Dwan Bolt, MD DOB: 1955/12/24   DOA: 10/04/2015   DOS: 10/12/2015   Date of Service: the patient was seen and examined on 10/12/2015  Subjective: Patient denies any acute complaints but remains significantly lethargic. Patient mentions she uses a C Pap at home and is compliant with it. Denies any chest pain or abdominal pain. His requiring increase in moderate of oxygen this morning Nutrition: Tolerating oral diet  Brief hospital course: Patient was admitted on 10/04/2015, with complaint of shortness of breath progressively worsening without any chest pain or cough or fever or chills, was found to have acute on chronic diastolic dysfunction with elevated troponin and elevated creatinine. Patient also had hyperkalemia with metabolic acidosis. Patient was treated conservatively with Lasix and then transferred to [facility] for hemodialysis Currently further plan is continue hemodialysis and arrange for outpatient discharge HD.  Assessment and Plan: 1. Acute pulmonary edema (HCC) Suspected acute on chronic diastolic dysfunction. Acute on chronic kidney disease stage IV New ESRD Acute hypoxic respiratory failure  Oxygenation is significantly improving with fluid removal. Initially attempted with the Lasix but did not respond and currently on hemodialysis. Left arm fistula placed in 07/25/2015. Unable to use at present. Tunneled HD catheter on May 9. Hemodialysis started on May 5 with temporary catheter and will need lifelong hemodialysis an outpatient HD clip Oxygen requirement has increased after transfusion and continues to remain elevated. will recheck an x-ray in the morning.  2. Hyperkalemia. Potassium was 7.1 on admission currently resolved with hemodialysis.  3. Elevated troponin. Troponin was 11 on May 6. Which is likely a false positive. Echocardiogram does not show any acute wall  motion abnormality EKG does not show any acute ischemic changes. Currently on aspirin and statin and beta blocker. Cardiology was consulted and appreciate input.  4. Acute encephalopathy. Currently resolving. Avoid sedating medications.  5. Essential hypertension. Blood pressure getting stable after hemodialysis initiation. Continue amlodipine and metoprolol.  6. Anemia of chronic kidney disease. Iron studies, folic acid, TSH within normal limits. B-12 elevated. No active bleeding. Transfused 1 unit on May of 9. I would transfuse one unit due to significant weakness and monitor clinically. Monitor serial H&H.  7. Generalized fatigue and weakness with physical deconditioning. CPK normal not on any psychotropic medication. Phosphorus levels adequately elevated. TSH cortisol level unremarkable. Hemodynamically stable. At present the etiology of her generalized fatigue and weakness is most likely physical deconditioning. Patient will need significant physical therapy. Unsure regarding patient's discharge plan since it is unlikely that the patient will be able to tolerate hemodialysis given her physical condition as an outpatient. Discussed with social worker regarding other avenues for discharge  8. Obesity hypoventilation syndrome. Resume home C Pap tonight.  Pain management: Tylenol when necessary Activity: physical therapy SNF Bowel regimen: last BM 10/07/2015 stool soft added Diet: Renal diet DVT Prophylaxis: subcutaneous Heparin  Advance goals of care discussion: Full code  Family Communication: no family was present at bedside, at the time of interview.   Disposition:  Discharge to SNF pending clip Expected discharge date: 10/15/2015  Consultants: Nephrology, vascular surgery Procedures: Hemodialysis, TDC placement  Antibiotics: Anti-infectives    Start     Dose/Rate Route Frequency Ordered Stop   10/08/15 1515  cefUROXime (ZINACEF) 1.5 g in dextrose 5 % 50 mL IVPB      1.5 g 100 mL/hr over 30 Minutes Intravenous To Surgery 10/08/15 1500 10/09/15 1515  Intake/Output Summary (Last 24 hours) at 10/12/15 1915 Last data filed at 10/12/15 1849  Gross per 24 hour  Intake    890 ml  Output   3425 ml  Net  -2535 ml   Filed Weights   10/10/15 0810 10/10/15 1211 10/12/15 1213  Weight: 180 kg (396 lb 13.3 oz) 176 kg (388 lb 0.2 oz) 178 kg (392 lb 6.7 oz)    Objective: Physical Exam: Filed Vitals:   10/12/15 1645 10/12/15 1700 10/12/15 1707 10/12/15 1747  BP: 137/76 142/77 151/86 144/85  Pulse: 98 100 100 100  Temp: 97.3 F (36.3 C) 97.7 F (36.5 C) 97.7 F (36.5 C) 98.3 F (36.8 C)  TempSrc: Oral Oral Oral Oral  Resp: 20 20 18 17   Height:      Weight:      SpO2: 98% 98% 97% 100%   General: Alert, Awake and Oriented to Time, Place and Person. Appear in mild distress Eyes: PERRL, Conjunctiva normal ENT: Oral Mucosa clear moist. Cardiovascular: S1 and S2 Present, aortic systolic Murmur, Peripheral Pulses Present Respiratory: Bilateral Air entry equal and Decreased,  Clear to Auscultation, no Crackles, no wheezes Abdomen: Bowel Sound present, Soft and no tenderness Extremities: trace Pedal edema, no calf tenderness Neurologic: Grossly no focal neuro deficit. Bilaterally Equal motor strength  Data Reviewed: CBC:  Recent Labs Lab 10/07/15 0620 10/08/15 0600 10/09/15 0727 10/10/15 0518 10/11/15 1030 10/12/15 0830  WBC 10.4 10.6* 9.8 8.6 10.7* 12.0*  NEUTROABS 7.7  --   --   --   --  9.5*  HGB 7.6* 7.2* 8.2* 8.1* 8.0* 10.2*  HCT 23.1* 22.2* 25.1* 24.8* 25.4* 31.7*  MCV 81.1 81.6 85.1 85.5 84.1 88.1  PLT 189 205 202 187 201 [id number]   Basic Metabolic Panel:  Recent Labs Lab 10/08/15 0257 10/09/15 0727 10/10/15 0512 10/11/15 0559 10/12/15 0627  NA 141 139 141 140 140  K 5.3* 4.2 4.3 5.7* 4.0  CL 103 100* 100* 100* 99*  CO2 22 24 26 23 25   GLUCOSE 96 98 96 83 101*  BUN 69* 37* 46* 32* 33*  CREATININE 10.32* 6.76* 8.25*  6.09* 7.55*  CALCIUM 7.9* 8.2* 8.1* 8.5* 8.2*  MG  --   --   --   --  2.1  PHOS 9.4* 7.0* 8.3* 5.4* 5.6*    Liver Function Tests:  Recent Labs Lab 10/08/15 0257 10/09/15 0727 10/10/15 0512 10/11/15 0559 10/12/15 0627  AST  --   --   --   --  50*  ALT  --   --   --   --  41  ALKPHOS  --   --   --   --  129*  BILITOT  --   --   --   --  0.4  PROT  --   --   --   --  5.7*  ALBUMIN 1.6* 1.6* 1.5* 1.6* 1.6*   No results for input(s): LIPASE, AMYLASE in the last 168 hours.  Recent Labs Lab 10/08/15 0240  AMMONIA 43*   Coagulation Profile: No results for input(s): INR, PROTIME in the last 168 hours. Cardiac Enzymes:  Recent Labs Lab 10/06/15 0930 10/06/15 1438 10/06/15 2130 10/11/15 1557  CKTOTAL  --   --   --  125  TROPONINI 11.23* 0.06* 0.06*  --    BNP (last 3 results) No results for input(s): PROBNP in the last 8760 hours.  CBG:  Recent Labs Lab 10/11/15 0750 10/11/15 1119 10/11/15 2130 10/12/15 0753  10/12/15 1744  GLUCAP 88 119* 111* 101* 114*    Studies: No results found.   Scheduled Meds: . sodium chloride   Intravenous Once  . sodium chloride   Intravenous Once  . amLODipine  10 mg Oral QHS  . aspirin EC  81 mg Oral Daily  . atorvastatin  40 mg Oral Daily  . darbepoetin (ARANESP) injection - DIALYSIS  100 mcg Intravenous Q Wed-HD  . doxercalciferol  1 mcg Intravenous Q M,W,F-HD  . febuxostat  40 mg Oral Daily  . ferric gluconate (FERRLECIT/NULECIT) IV  125 mg Intravenous Q M,W,F-HD  . heparin subcutaneous  5,000 Units Subcutaneous Q8H  . metoprolol succinate  50 mg Oral BID  . multivitamin with minerals  1 tablet Oral Daily  . neomycin-bacitracin-polymyxin   Topical Daily  . sevelamer carbonate  2,400 mg Oral TID WC  . sodium chloride flush  3 mL Intravenous Q12H   Continuous Infusions:   PRN Meds: acetaminophen **OR** acetaminophen, hydrALAZINE, hydrocortisone cream, lactulose, lip balm, nitroGLYCERIN, ondansetron **OR** ondansetron  (ZOFRAN) IV  Time spent: 30 minutes  Author: Berle Mull, MD [facility] Pager: [phone] 10/12/2015 7:15 PM  If 7PM-7AM, please contact night-coverage at [email or web address], password [facility]

## 2015-10-12 NOTE — Procedures (Signed)
I have personally attended this patient's dialysis session.   Dialyzing in a [employer] [place] R IJ at 400 UF goal 4500 - appears have gotten [phone] off [place], MD [facility] [phone] Pager 10/12/2015, 5:31 PM

## 2015-10-12 NOTE — Progress Notes (Signed)
Attempted CPAP with patient, she was unable to tolerate it at this time, due to "not being able to breathe." She states that she will bring her home CPAP mask tomorrow. RT will continue to monitor.

## 2015-10-13 ENCOUNTER — Inpatient Hospital Stay (HOSPITAL_COMMUNITY): Payer: Medicare Other

## 2015-10-13 LAB — GLUCOSE, CAPILLARY
GLUCOSE-CAPILLARY: 97 mg/dL (ref 65–99)
Glucose-Capillary: 152 mg/dL — ABNORMAL HIGH (ref 65–99)
Glucose-Capillary: 114 mg/dL — ABNORMAL HIGH (ref 65–99)
Glucose-Capillary: 117 mg/dL — ABNORMAL HIGH (ref 65–99)

## 2015-10-13 LAB — RENAL FUNCTION PANEL
Albumin: 1.6 g/dL — ABNORMAL LOW (ref 3.5–5.0)
Anion gap: 14 (ref 5–15)
BUN: 16 mg/dL (ref 6–20)
CALCIUM: 8.4 mg/dL — AB (ref 8.9–10.3)
CO2: 30 mmol/L (ref 22–32)
CREATININE: 4.87 mg/dL — AB (ref 0.44–1.00)
Chloride: 97 mmol/L — ABNORMAL LOW (ref 101–111)
GFR calc Af Amer: 10 mL/min — ABNORMAL LOW (ref >60)
GFR calc non Af Amer: 9 mL/min — ABNORMAL LOW (ref >60)
GLUCOSE: 109 mg/dL — AB (ref 65–99)
Phosphorus: 3.7 mg/dL (ref 2.5–4.6)
Potassium: 3.6 mmol/L (ref 3.5–5.1)
SODIUM: 141 mmol/L (ref 135–145)

## 2015-10-13 LAB — TYPE AND SCREEN
ABO/RH(D): B POS
ANTIBODY SCREEN: NEGATIVE
UNIT DIVISION: 0

## 2015-10-13 LAB — CBC
HCT: 28.6 % — ABNORMAL LOW (ref 36.0–46.0)
Hemoglobin: 8.9 g/dL — ABNORMAL LOW (ref 12.0–15.0)
MCH: 27.5 pg (ref 26.0–34.0)
MCHC: 31.1 g/dL (ref 30.0–36.0)
MCV: 88.3 fL (ref 78.0–100.0)
PLATELETS: 217 10*3/uL (ref 150–400)
RBC: 3.24 MIL/uL — ABNORMAL LOW (ref 3.87–5.11)
RDW: 14.6 % (ref 11.5–15.5)
WBC: 10.3 10*3/uL (ref 4.0–10.5)

## 2015-10-13 NOTE — Progress Notes (Signed)
[name] Rounding Note  Subjective: No shortness of breath this morning. No complaints. Tolerated HD yesterday.    Physical Exam: Filed Vitals:   10/13/15 0341 10/13/15 0841  BP: 123/48 140/67  Pulse: 93 94  Temp: 98.3 F (36.8 C) 99.1 F (37.3 C)  Resp: 19 18     General: 60 year old female, lying in hospital bed, [place] in place HEENT: MMM Heart: Distant heart sounds. RRR, no murmurs noted. Lungs: Limited secondary to patient's habitus. Distant breath sounds, but air movement noted bilaterally.  Abdomen: Obese, S, NT, ND Extremities: lower extremity edema noted with signs of chronic venous stasis changes. Firm swelling of left upper arm, approx 3 cm in diameter, non tender, non fluctant, no overlying skin changes or warmth- AVF with hematoma from last week's infiltration Skin: Warm, dry Neuro: alert, no focal deficits  R IJ TDC (5/9) for HD.    Weight 10/04/15   186 kg 5/5        188 kg 5/6        188.2 kg 5/7        180 kg 5/8        176 kg 5/10      176 <<180 kg 5/12   178kg   Results for [name] (MRN [id number]) as of 10/08/2015 17:49  Ref. Range 10/07/2015 03:55  Iron Latest Ref Range: 28-170 ug/dL 30  UIBC Latest Units: ug/dL 88  TIBC Latest Ref Range: 250-450 ug/dL 118 (L)  Saturation Ratios Latest Ref Range: 10.4-31.8 % 26     Recent Labs  10/12/15 0627 10/13/15 0653  NA 140 141  K 4.0 3.6  CL 99* 97*  CO2 25 30  GLUCOSE 101* 109*  BUN 33* 16  CREATININE 7.55* 4.87*  CALCIUM 8.2* 8.4*  MG 2.1  --   PHOS 5.6* 3.7     Recent Labs  10/12/15 0627 10/13/15 0653  AST 50*  --   ALT 41  --   ALKPHOS 129*  --   BILITOT 0.4  --   PROT 5.7*  --   ALBUMIN 1.6* 1.6*   Lab Results  Component Value Date   PTH 570* 10/07/2015   PTH Comment 10/07/2015   CALCIUM 8.4* 10/13/2015   CAION 1.07* 10/05/2015   PHOS 3.7 10/13/2015      Recent Labs  10/12/15 0830 10/13/15 0653  WBC 12.0* 10.3  NEUTROABS 9.5*  --   HGB 10.2*  8.9*  HCT 31.7* 28.6*  MCV 88.1 88.3  PLT 240 217    Dg Chest Port 1 View  10/09/2015  CLINICAL DATA:  Dialysis catheter insertion EXAM: PORTABLE CHEST 1 VIEW COMPARISON:  10/08/2015 FINDINGS: Right IJ dialysis catheter tips SVC RA junction. No pneumothorax or effusion. Stable cardiomegaly and central vascular congestion. No focal pneumonia, collapse or consolidation. Degenerative changes of the spine. Stable hilar prominence as before. IMPRESSION: Right IJ dialysis catheter tips SVC RA junction. Stable cardiomegaly and central vascular congestion No complicating feature. Electronically Signed   By: Jerilynn Mages.  Shick M.D.   On: 10/09/2015 16:25   Medications . sodium chloride   Intravenous Once  . sodium chloride   Intravenous Once  . amLODipine  10 mg Oral QHS  . aspirin EC  81 mg Oral Daily  . atorvastatin  40 mg Oral Daily  . darbepoetin (ARANESP) injection - DIALYSIS  100 mcg Intravenous Q Wed-HD  . doxercalciferol  1 mcg Intravenous Q M,W,F-HD  . febuxostat  40 mg Oral  Daily  . ferric gluconate (FERRLECIT/NULECIT) IV  125 mg Intravenous Q M,W,F-HD  . heparin subcutaneous  5,000 Units Subcutaneous Q8H  . lactulose  10 g Oral BID  . metoprolol succinate  50 mg Oral BID  . multivitamin with minerals  1 tablet Oral Daily  . neomycin-bacitracin-polymyxin   Topical Daily  . polyethylene glycol  17 g Oral Daily  . senna-docusate  1 tablet Oral BID  . sevelamer carbonate  2,400 mg Oral TID WC  . sodium chloride flush  3 mL Intravenous Q12H   Background 60 y.o. 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 CXR pulm edema, K >7. Had urgent HD initiated 5/6 via temp cath, (has AVF that is too deep to be usable and was infiltrated with attempt to use). New ESRD with outpt planning underway, mobility biggest deterrent to outpt HD. HD initiated 10/05/15  Assessment/Plan: 1. AKI  on CKD4 ->new ESRD.  Temporary catheter placed 5/6. s/p Diatek placement by [facility] (AVF to be superficialized at a later date - see their note). Last HD 5/12.  1. CLIP process has been on hold as she is not a candidate for outpatient HD locally at this time - she is too large for standard [employer] lift and "Big Boy" recliner at outpatient HD centers. Will try to find a place to accommodate her.  2. CKD- MBD - Renvela. PTH elevated to 570. Starting VDRA with HD (not sure if she will go to a unit that uses hectorol or calcitriol - for now using hectorol 1 mcg TIW with HD) 3. Normocytic Anemia. hgb 7-8 during this admission. S/p 1U pRBC yesterday 5/12. (4 units total during this admission). Continue Aranesp 100 QWED and getting load of iron with HD 4. Gout. On uloric.  5. HTN. On metoprolol and amlodipine 6. T2DM. On SSI.  7. HLD. On statin and aspirin per primary.  8. Severe immobility - was walking PTA (sister confirms pt attended church week before admission), now with significantly limited mobility. Will need to follow with PT inpatient. May benefit from [facility] on discharge 9. Left arm fistula: patient noted where fistula has infiltrated, no concern for acute pathology will be having superficialiazation of AVF in a couple of weeks per [facility] once hematoma resolves  Dimas Chyle 10/13/2015, 9:47 AM   I have seen and examined this patient and agree with plan and assessment in the above note. Will need CSW help as CLIP process has been on hold as she is not a candidate for outpatient HD locally - she is too large for standard [employer] lifts and "Big Boy" recliners at outpatient HD centers. They will need to try to find a place to accommodate her.   Cedar Ditullio B,MD 10/13/2015 11:21 AM

## 2015-10-13 NOTE — Progress Notes (Signed)
[facility] Progress Note  Patient: [name] [id number]   PCP: Dwan Bolt, MD DOB: 01-Feb-1956   DOA: 10/04/2015   DOS: 10/13/2015   Date of Service: the patient was seen and examined on 10/13/2015  Subjective: Unable to tolerate Z-Pak. Denies any other acute complaint. No nausea no vomiting no chest pain. Continues to have generalized weakness. Nutrition: Tolerating oral diet  Brief hospital course: Patient was admitted on 10/04/2015, with complaint of shortness of breath progressively worsening without any chest pain or cough or fever or chills, was found to have acute on chronic diastolic dysfunction with elevated troponin and elevated creatinine. Patient also had hyperkalemia with metabolic acidosis. Patient was treated conservatively with Lasix and then transferred to [facility] for hemodialysis Currently further plan is continue hemodialysis and arrange for outpatient discharge HD.  Assessment and Plan: 1. Acute pulmonary edema (HCC) Suspected acute on chronic diastolic dysfunction. Acute on chronic kidney disease stage IV New ESRD Acute hypoxic respiratory failure  Oxygenation is significantly improving with fluid removal. Initially attempted with the Lasix but did not respond and currently on hemodialysis. Left arm fistula placed in 07/25/2015. Unable to use at present. Tunneled HD catheter on May 9. Hemodialysis started on May 5 with temporary catheter and will need lifelong hemodialysis an outpatient HD clip Oxygen requirement has returned to room air Chest x-ray on 10/13/2015 shows evidence of mild CHF congestion  2. Hyperkalemia. Potassium was 7.1 on admission currently resolved with hemodialysis.  3. Elevated troponin. Troponin was 11 on May 6. Which is likely a false positive. Echocardiogram does not show any acute wall motion abnormality EKG does not show any acute ischemic changes. Currently on aspirin and statin and beta  blocker. Cardiology was consulted and appreciate input.  4. Acute encephalopathy. Currently resolving. Avoid sedating medications.  5. Essential hypertension. Blood pressure getting stable after hemodialysis initiation. Continue amlodipine and metoprolol.  6. Anemia of chronic kidney disease. Iron studies, folic acid, TSH within normal limits. B-12 elevated. No active bleeding. Transfused 1 unit on May of 9. I would transfuse one unit due to significant weakness and monitor clinically. Monitor serial H&H.  7. Generalized fatigue and weakness with physical deconditioning. CPK normal not on any psychotropic medication. Phosphorus levels adequately elevated. TSH cortisol level unremarkable. Hemodynamically stable. Blood cultures no growth to date. H&H improved after transfusion  improvement in weakness after transfusion. At present the etiology of her generalized fatigue and weakness is most likely physical deconditioning. Patient will need significant physical therapy. Unsure regarding patient's discharge plan since it is unlikely that the patient will be able to tolerate hemodialysis given her physical condition as an outpatient. Discussed with social worker regarding other avenues for discharge  8. Obesity hypoventilation syndrome. Resume home C Pap tonight.  Pain management: Tylenol when necessary Activity: physical therapy SNF Bowel regimen: last BM 10/07/2015 stool soft added Diet: Renal diet DVT Prophylaxis: subcutaneous Heparin  Advance goals of care discussion: Full code  Family Communication: no family was present at bedside, at the time of interview.   Disposition:  Discharge to SNF pending clip Expected discharge date: 10/15/2015  Consultants: Nephrology, vascular surgery Procedures: Hemodialysis, TDC placement  Antibiotics: Anti-infectives    Start     Dose/Rate Route Frequency Ordered Stop   10/08/15 1515  cefUROXime (ZINACEF) 1.5 g in dextrose 5 % 50 mL  IVPB     1.5 g 100 mL/hr over 30 Minutes Intravenous To Surgery 10/08/15 1500 10/09/15 1515  Intake/Output Summary (Last 24 hours) at 10/13/15 2039 Last data filed at 10/13/15 1840  Gross per 24 hour  Intake    480 ml  Output      0 ml  Net    480 ml   Filed Weights   10/10/15 0810 10/10/15 1211 10/12/15 1213  Weight: 180 kg (396 lb 13.3 oz) 176 kg (388 lb 0.2 oz) 178 kg (392 lb 6.7 oz)    Objective: Physical Exam: Filed Vitals:   10/12/15 2039 10/13/15 0341 10/13/15 0841 10/13/15 1558  BP: 134/62 123/48 140/67 145/62  Pulse: 102 93 94 96  Temp: 98.8 F (37.1 C) 98.3 F (36.8 C) 99.1 F (37.3 C) 99 F (37.2 C)  TempSrc: Oral Oral Oral Oral  Resp: 18 19 18 17   Height:      Weight:      SpO2: 100% 99% 93% 94%   General: Alert, Awake and Oriented to Time, Place and Person. Appear in mild distress Eyes: PERRL, Conjunctiva normal ENT: Oral Mucosa clear moist. Cardiovascular: S1 and S2 Present, aortic systolic Murmur, Peripheral Pulses Present Respiratory: Bilateral Air entry equal and Decreased,  Clear to Auscultation, no Crackles, no wheezes Abdomen: Bowel Sound present, Soft and no tenderness Extremities: trace Pedal edema, no calf tenderness Neurologic: Grossly no focal neuro deficit. Bilaterally Equal motor strength  Data Reviewed: CBC:  Recent Labs Lab 10/07/15 0620  10/09/15 0727 10/10/15 0518 10/11/15 1030 10/12/15 0830 10/13/15 0653  WBC 10.4  < > 9.8 8.6 10.7* 12.0* 10.3  NEUTROABS 7.7  --   --   --   --  9.5*  --   HGB 7.6*  < > 8.2* 8.1* 8.0* 10.2* 8.9*  HCT 23.1*  < > 25.1* 24.8* 25.4* 31.7* 28.6*  MCV 81.1  < > 85.1 85.5 84.1 88.1 88.3  PLT 189  < > 202 187 201 240 217  < > = values in this interval not displayed. Basic Metabolic Panel:  Recent Labs Lab 10/09/15 0727 10/10/15 0512 10/11/15 0559 10/12/15 0627 10/13/15 0653  NA 139 141 140 140 141  K 4.2 4.3 5.7* 4.0 3.6  CL 100* 100* 100* 99* 97*  CO2 24 26 23 25 30   GLUCOSE 98  96 83 101* 109*  BUN 37* 46* 32* 33* 16  CREATININE 6.76* 8.25* 6.09* 7.55* 4.87*  CALCIUM 8.2* 8.1* 8.5* 8.2* 8.4*  MG  --   --   --  2.1  --   PHOS 7.0* 8.3* 5.4* 5.6* 3.7    Liver Function Tests:  Recent Labs Lab 10/09/15 0727 10/10/15 0512 10/11/15 0559 10/12/15 0627 10/13/15 0653  AST  --   --   --  50*  --   ALT  --   --   --  41  --   ALKPHOS  --   --   --  129*  --   BILITOT  --   --   --  0.4  --   PROT  --   --   --  5.7*  --   ALBUMIN 1.6* 1.5* 1.6* 1.6* 1.6*   No results for input(s): LIPASE, AMYLASE in the last 168 hours.  Recent Labs Lab 10/08/15 0240  AMMONIA 43*   Coagulation Profile: No results for input(s): INR, PROTIME in the last 168 hours. Cardiac Enzymes:  Recent Labs Lab 10/06/15 2130 10/11/15 1557  CKTOTAL  --  125  TROPONINI 0.06*  --    BNP (last 3 results) No results for input(s):  PROBNP in the last 8760 hours.  CBG:  Recent Labs Lab 10/12/15 1744 10/12/15 2037 10/13/15 0839 10/13/15 1139 10/13/15 1632  GLUCAP 114* 142* 97 152* 114*    Studies: Dg Chest 2 View  10/13/2015  CLINICAL DATA:  60 year old female with shortness of breath. EXAM: CHEST  2 VIEW COMPARISON:  Chest x-ray 10/09/2015. FINDINGS: Right internal jugular Vas-Cath with tip terminating in the mid to distal superior vena cava. Lung volumes are slightly low. There is cephalization of the pulmonary vasculature and slight indistinctness of the interstitial markings suggestive of mild pulmonary edema. No pleural effusions. Mild cardiomegaly. Upper mediastinal contours are within normal limits allowing for patient rotation to the right and lordotic positioning. IMPRESSION: 1. The appearance of the chest suggests mild congestive heart failure, as above. Electronically Signed   By: Vinnie Langton M.D.   On: 10/13/2015 13:40     Scheduled Meds: . sodium chloride   Intravenous Once  . sodium chloride   Intravenous Once  . amLODipine  10 mg Oral QHS  . aspirin EC  81  mg Oral Daily  . atorvastatin  40 mg Oral Daily  . darbepoetin (ARANESP) injection - DIALYSIS  100 mcg Intravenous Q Wed-HD  . doxercalciferol  1 mcg Intravenous Q M,W,F-HD  . febuxostat  40 mg Oral Daily  . ferric gluconate (FERRLECIT/NULECIT) IV  125 mg Intravenous Q M,W,F-HD  . heparin subcutaneous  5,000 Units Subcutaneous Q8H  . lactulose  10 g Oral BID  . metoprolol succinate  50 mg Oral BID  . multivitamin with minerals  1 tablet Oral Daily  . neomycin-bacitracin-polymyxin   Topical Daily  . polyethylene glycol  17 g Oral Daily  . senna-docusate  1 tablet Oral BID  . sevelamer carbonate  2,400 mg Oral TID WC  . sodium chloride flush  3 mL Intravenous Q12H   Continuous Infusions:   PRN Meds: acetaminophen **OR** acetaminophen, hydrALAZINE, hydrocortisone cream, lactulose, lip balm, nitroGLYCERIN, ondansetron **OR** ondansetron (ZOFRAN) IV  Time spent: 30 minutes  Author: Berle Mull, MD [facility] Pager: [phone] 10/13/2015 8:39 PM  If 7PM-7AM, please contact night-coverage at [email or web address], password [facility]

## 2015-10-13 NOTE — Progress Notes (Signed)
Patient refusing CPAP therapy at this time.  Patient advised to notify RN if she decides to use CPAP.

## 2015-10-13 NOTE — Clinical Social Work Note (Signed)
CSW spoke with patient's sister Tiawana Clouse who would like to be the main contact per patient's request her number is [phone].  CSW informed patient's sister that case management is working on patient's case to determine if she is a candidate for [facility] or not.  CSW continuing to follow patient's progress, if patient needs SNF.  Jones Broom. Corn, MSW, [place] 10/13/2015 5:31 PM

## 2015-10-14 ENCOUNTER — Inpatient Hospital Stay (HOSPITAL_COMMUNITY): Payer: Medicare Other

## 2015-10-14 LAB — RENAL FUNCTION PANEL
ALBUMIN: 1.9 g/dL — AB (ref 3.5–5.0)
ANION GAP: 13 (ref 5–15)
BUN: 25 mg/dL — ABNORMAL HIGH (ref 6–20)
CO2: 30 mmol/L (ref 22–32)
Calcium: 8.7 mg/dL — ABNORMAL LOW (ref 8.9–10.3)
Chloride: 98 mmol/L — ABNORMAL LOW (ref 101–111)
Creatinine, Ser: 6.53 mg/dL — ABNORMAL HIGH (ref 0.44–1.00)
GFR, EST AFRICAN AMERICAN: 7 mL/min — AB (ref >60)
GFR, EST NON AFRICAN AMERICAN: 6 mL/min — AB (ref >60)
Glucose, Bld: 97 mg/dL (ref 65–99)
PHOSPHORUS: 4.4 mg/dL (ref 2.5–4.6)
POTASSIUM: 4.5 mmol/L (ref 3.5–5.1)
Sodium: 141 mmol/L (ref 135–145)

## 2015-10-14 LAB — CBC
HEMATOCRIT: 31.9 % — AB (ref 36.0–46.0)
HEMOGLOBIN: 9.4 g/dL — AB (ref 12.0–15.0)
MCH: 26.8 pg (ref 26.0–34.0)
MCHC: 29.5 g/dL — ABNORMAL LOW (ref 30.0–36.0)
MCV: 90.9 fL (ref 78.0–100.0)
Platelets: 272 10*3/uL (ref 150–400)
RBC: 3.51 MIL/uL — AB (ref 3.87–5.11)
RDW: 15.1 % (ref 11.5–15.5)
WBC: 9.6 10*3/uL (ref 4.0–10.5)

## 2015-10-14 LAB — GLUCOSE, CAPILLARY
GLUCOSE-CAPILLARY: 85 mg/dL (ref 65–99)
GLUCOSE-CAPILLARY: 108 mg/dL — AB (ref 65–99)
Glucose-Capillary: 112 mg/dL — ABNORMAL HIGH (ref 65–99)
Glucose-Capillary: 158 mg/dL — ABNORMAL HIGH (ref 65–99)

## 2015-10-14 NOTE — Progress Notes (Signed)
[facility] Rounding Note  Subjective: Lying in bed Last HD was 5/12 Had HD in [place] Chair  Physical Examination: BP 125/70 mmHg  Pulse 80  Temp(Src) 98.2 F (36.8 C) (Oral)  Resp 19  Ht 5\' 6"  (1.676 m)  Wt 178 kg (392 lb 6.7 oz)  BMI 63.37 kg/m2  SpO2 98%  MO AAF lying in bed Heart: Regular. SS2 No S3. Heart sounds distant.  Lungs: Limited secondary to patient's habitus.  Distant breath sounds, but air movement noted bilaterally.  Abdomen: Obese, huge pannus with large amount abd wall tissue to the sides Extremities:+  lower extremity edema noted with signs of chronic venous stasis changes.  Swelling in L upper arm from AVF infiltration last week decreasing Neuro: alert, no focal deficits R IJ TDC (5/9)  No weight since 5/12 and no pre/post HD weight that date  Weight 5/10      176 <<180 kg with HD 5/12   178kg  Lab Results  Component Value Date   IRON 30 10/07/2015   TIBC 118* 10/07/2015   FERRITIN 47 10/07/2015  TSat  26%   Recent Labs  10/12/15 0627 10/13/15 0653 10/14/15 0652  NA 140 141 141  K 4.0 3.6 4.5  CL 99* 97* 98*  CO2 25 30 30   GLUCOSE 101* 109* 97  BUN 33* 16 25*  CREATININE 7.55* 4.87* 6.53*  CALCIUM 8.2* 8.4* 8.7*  MG 2.1  --   --   PHOS 5.6* 3.7 4.4     Recent Labs  10/12/15 0627 10/13/15 0653 10/14/15 0652  AST 50*  --   --   ALT 41  --   --   ALKPHOS 129*  --   --   BILITOT 0.4  --   --   PROT 5.7*  --   --   ALBUMIN 1.6* 1.6* 1.9*   Lab Results  Component Value Date   PTH 570* 10/07/2015   PTH Comment 10/07/2015   CALCIUM 8.7* 10/14/2015   CAION 1.07* 10/05/2015   PHOS 4.4 10/14/2015      Recent Labs  10/12/15 0830 10/13/15 0653 10/14/15 0652  WBC 12.0* 10.3 9.6  NEUTROABS 9.5*  --   --   HGB 10.2* 8.9* 9.4*  HCT 31.7* 28.6* 31.9*  MCV 88.1 88.3 90.9  PLT 240 217 272    Dg Chest 2 View  10/13/2015  CLINICAL DATA:  60 year old female with shortness of breath. EXAM: CHEST  2 VIEW  COMPARISON:  Chest x-ray 10/09/2015. FINDINGS: Right internal jugular Vas-Cath with tip terminating in the mid to distal superior vena cava. Lung volumes are slightly low. There is cephalization of the pulmonary vasculature and slight indistinctness of the interstitial markings suggestive of mild pulmonary edema. No pleural effusions. Mild cardiomegaly. Upper mediastinal contours are within normal limits allowing for patient rotation to the right and lordotic positioning. IMPRESSION: 1. The appearance of the chest suggests mild congestive heart failure, as above. Electronically Signed   By: Vinnie Langton M.D.   On: 10/13/2015 13:40   Medications . sodium chloride   Intravenous Once  . sodium chloride   Intravenous Once  . amLODipine  10 mg Oral QHS  . aspirin EC  81 mg Oral Daily  . atorvastatin  40 mg Oral Daily  . darbepoetin (ARANESP) injection - DIALYSIS  100 mcg Intravenous Q Wed-HD  . doxercalciferol  1 mcg Intravenous Q M,W,F-HD  . febuxostat  40 mg Oral Daily  . ferric gluconate (FERRLECIT/NULECIT) IV  125 mg Intravenous Q M,W,F-HD  . heparin subcutaneous  5,000 Units Subcutaneous Q8H  . lactulose  10 g Oral BID  . metoprolol succinate  50 mg Oral BID  . multivitamin with minerals  1 tablet Oral Daily  . neomycin-bacitracin-polymyxin   Topical Daily  . polyethylene glycol  17 g Oral Daily  . senna-docusate  1 tablet Oral BID  . sevelamer carbonate  2,400 mg Oral TID WC  . sodium chloride flush  3 mL Intravenous Q12H   Background 60 y.o. 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 CXR pulm edema, K >7. Had urgent HD initiated 5/6 via temp cath, (has AVF that is too deep to be usable and was infiltrated with attempt to use). New ESRD with outpt planning underway, mobility biggest deterrent to outpt HD. HD initiated 10/05/15  Assessment/Plan:  1. AKI on  CKD4 ->new ESRD.  Temporary catheter placed 5/6. s/p Diatek placement by [facility] 5/9 (AVF to be superficialized at a later date - see their note). Last HD 5/12. For HD on Monday 1. CLIP process has been placed on hold as she is not a candidate for outpatient HD locally at this time - she is too large for "standard" Harrel Lemon lift (tried here - she did not fit) and "Big Boy" recliner too large for the doorways at outpatient HD centers. Will need try to find a place to accommodate her. 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 PTH elevated to 570. Started VDRA with HD (not sure if she will go to a unit that uses hectorol or calcitriol - for now using hectorol 1 mcg TIW with HD) 3. Normocytic Anemia. hgb 7-8 during this admission. S/p 1U pRBC  5/12. (4 units total during this admission). Continue Aranesp 100 QWED and getting load of iron with HD 4. Gout. On uloric.  5. HTN. On metoprolol and amlodipine 6. T2DM. On SSI.  7. HLD. On statin and aspirin per primary.  8. Severe immobility - was walking PTA (sister confirms pt attended church week before admission), now with significantly limited mobility. Will need to follow with PT inpatient. May benefit from [facility] on discharge but not sure they will take her if HD only reason 9. Left arm fistula (07/2015 Dr. Bridgett Larsson): [facility] aware that AVF needs superficialization. Per their note 5/8 will relook  at once hematoma resolved from infiltration  Jamal Maes, MD [facility] [phone] Pager 10/14/2015, 11:50 AM

## 2015-10-14 NOTE — Progress Notes (Signed)
Dup note See Neph note 5/14  Jamal Maes, MD [place] Pager 10/14/2015, 11:59 AM  ]

## 2015-10-14 NOTE — Progress Notes (Signed)
[facility] Progress Note  Patient: [name] [id number]   PCP: Dwan Bolt, MD DOB: [birth year]   DOA: 10/04/2015   DOS: 10/14/2015   Date of Service: the patient was seen and examined on 10/14/2015  Subjective: Patient mentions she is feeling better. Continues to have constipation. No nausea no vomiting no abdominal pain. No chest pain. She feels that her strength is getting better as well. Nutrition: Tolerating oral diet  Brief hospital course: Patient was admitted on 10/04/2015, with complaint of shortness of breath progressively worsening without any chest pain or cough or fever or chills, was found to have acute on chronic diastolic dysfunction with elevated troponin and elevated creatinine. Patient also had hyperkalemia with metabolic acidosis. Patient was treated conservatively with Lasix and then transferred to [facility] for hemodialysis Currently further plan is continue hemodialysis and arrange for outpatient discharge HD.  Assessment and Plan: 1. Acute pulmonary edema (HCC) Suspected acute on chronic diastolic dysfunction. Acute on chronic kidney disease stage IV New ESRD Acute hypoxic respiratory failure Hyperkalemia.  Oxygenation is significantly improving with fluid removal. Initially attempted with the Lasix but did not respond and currently on hemodialysis. Left arm fistula placed in 07/25/2015. Unable to use at present. Tunneled HD catheter on May 9. Hemodialysis started on May 5 with temporary catheter and will need lifelong hemodialysis an outpatient HD clip Oxygen requirement has returned to room air during the day Chest x-ray on 10/13/2015 shows evidence of mild CHF congestion Potassium was 7.1 on admission currently resolved with hemodialysis.  2. Generalized fatigue and weakness with physical deconditioning. CPK normal not on any psychotropic medication. Phosphorus levels adequately elevated. TSH cortisol level  unremarkable. Hemodynamically stable. Blood cultures no growth to date. H&H improved after transfusion, improvement in weakness after transfusion. At present the etiology of her generalized fatigue and weakness is most likely physical deconditioning. Patient will need significant physical therapy.  3. Elevated troponin. Troponin was 11 on May 6. Which is likely a false positive. Echocardiogram does not show any acute wall motion abnormality EKG does not show any acute ischemic changes. Currently on aspirin and statin and beta blocker. Cardiology was consulted and appreciate input.  4. Acute encephalopathy. Currently resolving. Avoid sedating medications.  5. Essential hypertension. Blood pressure getting stable after hemodialysis initiation. Continue amlodipine and metoprolol.  6. Anemia of chronic kidney disease. Iron studies, folic acid, TSH within normal limits. B-12 elevated. No active bleeding. Transfused 1 unit on May of 9. I would transfuse one unit due to significant weakness and monitor clinically. Monitor serial H&H.  7. Obesity hypoventilation syndrome. Resume home C Pap tonight. Patient unable to tolerate hospital mask.  Pain management: Tylenol when necessary Activity: physical therapy SNF Bowel regimen: last BM 10/07/2015 stool soft increased Diet: Renal diet DVT Prophylaxis: subcutaneous Heparin  Advance goals of care discussion: Full code  Family Communication: no family was present at bedside, at the time of interview.   Disposition:  Discharge to SNF pending clip, and resolution of constipation Expected discharge date: 10/15/2015  Consultants: Nephrology, vascular surgery Procedures: Hemodialysis, TDC placement  Antibiotics: Anti-infectives    Start     Dose/Rate Route Frequency Ordered Stop   10/08/15 1515  cefUROXime (ZINACEF) 1.5 g in dextrose 5 % 50 mL IVPB     1.5 g 100 mL/hr over 30 Minutes Intravenous To Surgery 10/08/15 1500 10/09/15 1515         Intake/Output Summary (Last 24 hours) at 10/14/15 1024 Last data filed at 10/14/15  0600  Gross per 24 hour  Intake    420 ml  Output      0 ml  Net    420 ml   Filed Weights   10/10/15 0810 10/10/15 1211 10/12/15 1213  Weight: 180 kg (396 lb 13.3 oz) 176 kg (388 lb 0.2 oz) 178 kg (392 lb 6.7 oz)    Objective: Physical Exam: Filed Vitals:   10/13/15 0841 10/13/15 1558 10/13/15 2236 10/14/15 0616  BP: 140/67 145/62 135/74 126/63  Pulse: 94 96 92 82  Temp: 99.1 F (37.3 C) 99 F (37.2 C) 99.3 F (37.4 C) 98.1 F (36.7 C)  TempSrc: Oral Oral Oral Oral  Resp: 18 17 19 18   Height:      Weight:      SpO2: 93% 94% 99% 98%   General: Alert, Awake and Oriented to Time, Place and Person. Appear in no distress Eyes: PERRL, Conjunctiva normal ENT: Oral Mucosa clear moist. Cardiovascular: S1 and S2 Present, aortic systolic Murmur, Respiratory: Bilateral Air entry equal and Decreased,  Clear to Auscultation, no Crackles, no wheezes Abdomen: Bowel Sound present, Soft and no tenderness Extremities: trace Pedal edema, no calf tenderness Neurologic: Grossly no focal neuro deficit. Bilaterally Equal motor strength 5 out of 5  Data Reviewed: CBC:  Recent Labs Lab 10/10/15 0518 10/11/15 1030 10/12/15 0830 10/13/15 0653 10/14/15 0652  WBC 8.6 10.7* 12.0* 10.3 9.6  NEUTROABS  --   --  9.5*  --   --   HGB 8.1* 8.0* 10.2* 8.9* 9.4*  HCT 24.8* 25.4* 31.7* 28.6* 31.9*  MCV 85.5 84.1 88.1 88.3 90.9  PLT 187 201 240 217 [id number]   Basic Metabolic Panel:  Recent Labs Lab 10/10/15 0512 10/11/15 0559 10/12/15 0627 10/13/15 0653 10/14/15 0652  NA 141 140 140 141 141  K 4.3 5.7* 4.0 3.6 4.5  CL 100* 100* 99* 97* 98*  CO2 26 23 25 30 30   GLUCOSE 96 83 101* 109* 97  BUN 46* 32* 33* 16 25*  CREATININE 8.25* 6.09* 7.55* 4.87* 6.53*  CALCIUM 8.1* 8.5* 8.2* 8.4* 8.7*  MG  --   --  2.1  --   --   PHOS 8.3* 5.4* 5.6* 3.7 4.4    Liver Function Tests:  Recent Labs Lab  10/10/15 0512 10/11/15 0559 10/12/15 0627 10/13/15 0653 10/14/15 0652  AST  --   --  50*  --   --   ALT  --   --  41  --   --   ALKPHOS  --   --  129*  --   --   BILITOT  --   --  0.4  --   --   PROT  --   --  5.7*  --   --   ALBUMIN 1.5* 1.6* 1.6* 1.6* 1.9*   No results for input(s): LIPASE, AMYLASE in the last 168 hours.  Recent Labs Lab 10/08/15 0240  AMMONIA 43*   Coagulation Profile: No results for input(s): INR, PROTIME in the last 168 hours. Cardiac Enzymes:  Recent Labs Lab 10/11/15 1557  CKTOTAL 125   BNP (last 3 results) No results for input(s): PROBNP in the last 8760 hours.  CBG:  Recent Labs Lab 10/13/15 0839 10/13/15 1139 10/13/15 1632 10/13/15 2232 10/14/15 0840  GLUCAP 97 152* 114* 117* 85    Studies: No results found.   Scheduled Meds: . sodium chloride   Intravenous Once  . sodium chloride   Intravenous Once  . amLODipine  10 mg Oral QHS  . aspirin EC  81 mg Oral Daily  . atorvastatin  40 mg Oral Daily  . darbepoetin (ARANESP) injection - DIALYSIS  100 mcg Intravenous Q Wed-HD  . doxercalciferol  1 mcg Intravenous Q M,W,F-HD  . febuxostat  40 mg Oral Daily  . ferric gluconate (FERRLECIT/NULECIT) IV  125 mg Intravenous Q M,W,F-HD  . heparin subcutaneous  5,000 Units Subcutaneous Q8H  . lactulose  10 g Oral BID  . metoprolol succinate  50 mg Oral BID  . multivitamin with minerals  1 tablet Oral Daily  . neomycin-bacitracin-polymyxin   Topical Daily  . polyethylene glycol  17 g Oral Daily  . senna-docusate  1 tablet Oral BID  . sevelamer carbonate  2,400 mg Oral TID WC  . sodium chloride flush  3 mL Intravenous Q12H   Continuous Infusions:   PRN Meds: acetaminophen **OR** acetaminophen, hydrALAZINE, hydrocortisone cream, lactulose, lip balm, nitroGLYCERIN, ondansetron **OR** ondansetron (ZOFRAN) IV  Time spent: 30 minutes  Author: Berle Mull, MD [facility] Pager: [phone] 10/14/2015 10:24 AM  If 7PM-7AM, please  contact night-coverage at [email or web address], password [facility]

## 2015-10-15 ENCOUNTER — Inpatient Hospital Stay (HOSPITAL_COMMUNITY): Payer: Medicare Other

## 2015-10-15 LAB — RENAL FUNCTION PANEL
Albumin: 1.9 g/dL — ABNORMAL LOW (ref 3.5–5.0)
Anion gap: 10 (ref 5–15)
BUN: 33 mg/dL — AB (ref 6–20)
CHLORIDE: 97 mmol/L — AB (ref 101–111)
CO2: 32 mmol/L (ref 22–32)
Calcium: 8.6 mg/dL — ABNORMAL LOW (ref 8.9–10.3)
Creatinine, Ser: 8.29 mg/dL — ABNORMAL HIGH (ref 0.44–1.00)
GFR calc Af Amer: 5 mL/min — ABNORMAL LOW (ref >60)
GFR calc non Af Amer: 5 mL/min — ABNORMAL LOW (ref >60)
GLUCOSE: 128 mg/dL — AB (ref 65–99)
POTASSIUM: 3.9 mmol/L (ref 3.5–5.1)
Phosphorus: 4.6 mg/dL (ref 2.5–4.6)
Sodium: 139 mmol/L (ref 135–145)

## 2015-10-15 LAB — CBC
HEMATOCRIT: 30.3 % — AB (ref 36.0–46.0)
HEMOGLOBIN: 9.3 g/dL — AB (ref 12.0–15.0)
MCH: 27.3 pg (ref 26.0–34.0)
MCHC: 30.7 g/dL (ref 30.0–36.0)
MCV: 88.9 fL (ref 78.0–100.0)
Platelets: 252 10*3/uL (ref 150–400)
RBC: 3.41 MIL/uL — ABNORMAL LOW (ref 3.87–5.11)
RDW: 15 % (ref 11.5–15.5)
WBC: 11.6 10*3/uL — ABNORMAL HIGH (ref 4.0–10.5)

## 2015-10-15 LAB — GLUCOSE, CAPILLARY
GLUCOSE-CAPILLARY: 128 mg/dL — AB (ref 65–99)
GLUCOSE-CAPILLARY: 110 mg/dL — AB (ref 65–99)
Glucose-Capillary: 93 mg/dL (ref 65–99)

## 2015-10-15 MED ORDER — BISACODYL 10 MG RE SUPP
10.0000 mg | Freq: Every day | RECTAL | Status: DC | PRN
  Administered 2015-10-15: 10 mg via RECTAL
  Filled 2015-10-15: qty 1

## 2015-10-15 MED ORDER — SENNOSIDES-DOCUSATE SODIUM 8.6-50 MG PO TABS
2.0000 | ORAL_TABLET | Freq: Two times a day (BID) | ORAL | Status: DC
  Administered 2015-10-15: 2 via ORAL
  Filled 2015-10-15: qty 2

## 2015-10-15 MED ORDER — DOXERCALCIFEROL 4 MCG/2ML IV SOLN
INTRAVENOUS | Status: AC
  Administered 2015-10-15: 1 ug via INTRAVENOUS
  Filled 2015-10-15: qty 2

## 2015-10-15 MED ORDER — POLYETHYLENE GLYCOL 3350 17 G PO PACK
17.0000 g | PACK | Freq: Two times a day (BID) | ORAL | Status: DC
  Filled 2015-10-15 (×2): qty 1

## 2015-10-15 MED ORDER — MORPHINE SULFATE (PF) 2 MG/ML IV SOLN
2.0000 mg | Freq: Once | INTRAVENOUS | Status: DC
  Filled 2015-10-15: qty 1

## 2015-10-15 MED ORDER — DOCUSATE SODIUM 283 MG RE ENEM
1.0000 | ENEMA | Freq: Once | RECTAL | Status: AC
  Administered 2015-10-15: 1 via RECTAL
  Filled 2015-10-15: qty 1

## 2015-10-15 MED ORDER — TRAMADOL HCL 50 MG PO TABS
50.0000 mg | ORAL_TABLET | Freq: Once | ORAL | Status: AC
  Administered 2015-10-15: 50 mg via ORAL
  Filled 2015-10-15: qty 1

## 2015-10-15 NOTE — Progress Notes (Signed)
PT Cancellation Note  Patient Details Name: ANILAH CIMINO MRN: [id number] DOB: [birth year]   Cancelled Treatment:    Reason Eval/Treat Not Completed: Patient at procedure or test/unavailable   Currently in HD;  Will follow up later today as time allows;  Otherwise, will follow up for PT tomorrow;   Thank you,  Roney Marion, [place] Pager [phone] Office [phone]     Roney Marion [facility] 10/15/2015, 9:14 AM

## 2015-10-15 NOTE — Progress Notes (Signed)
[facility] Progress Note  Patient: [name] [id number]   PCP: Dwan Bolt, MD DOB: Jan 19, 1956   DOA: 10/04/2015   DOS: 10/15/2015   Date of Service: the patient was seen and examined on 10/15/2015  Subjective: The patient overnight complaint of abdominal cramping pain. X-ray abdomen did not show any significant obstruction. Denies any nausea or vomiting at the time of my evaluation, tolerating oral breakfast. Abdominal pain resolved. No shortness of breath Nutrition: Tolerating oral diet  Brief hospital course: Patient was admitted on 10/04/2015, with complaint of shortness of breath progressively worsening without any chest pain or cough or fever or chills, was found to have acute on chronic diastolic dysfunction with elevated troponin and elevated creatinine. Patient also had hyperkalemia with metabolic acidosis. Patient was treated conservatively with Lasix and then transferred to [facility] for hemodialysis. Currently further plan is continue hemodialysis and arrange for outpatient discharge HD.  Assessment and Plan: 1. Acute pulmonary edema (HCC) Suspected acute on chronic diastolic dysfunction. Acute on chronic kidney disease stage IV New ESRD Acute hypoxic respiratory failure Hyperkalemia.  Oxygenation is significantly improving with fluid removal. Initially attempted with the Lasix but did not respond and currently on hemodialysis. Left arm fistula placed in 07/25/2015. Unable to use at present. Tunneled HD catheter on May 9. Hemodialysis started on May 5 with temporary catheter and will need lifelong hemodialysis an outpatient HD clip Oxygen requirement has returned to room air during the day Chest x-ray on 10/13/2015 shows evidence of mild CHF congestion Potassium was 7.1 on admission currently resolved with hemodialysis. Having some difficulty on HD catheter today on 10/15/2015.  2. Generalized fatigue and weakness with physical  deconditioning. CPK normal, not on any psychotropic medication. Phosphorus levels adequately elevated. TSH & cortisol level unremarkable. Hemodynamically stable. Blood cultures no growth to date. H&H improved after transfusion, improvement in weakness after transfusion. At present the etiology of her generalized fatigue and weakness is most likely physical deconditioning. Patient will need significant physical therapy.  3. Elevated troponin. Troponin was 11 on May 6. Which is likely a false positive. Echocardiogram does not show any acute wall motion abnormality EKG does not show any acute ischemic changes. Currently on aspirin and statin and beta blocker. Cardiology was consulted and appreciate input.  4. Acute encephalopathy. Currently resolving. Avoid sedating medications.  5. Essential hypertension. Blood pressure getting stable after hemodialysis initiation. Continue amlodipine and metoprolol.  6. Anemia of chronic kidney disease. Iron studies, folic acid, TSH within normal limits. B-12 elevated. No active bleeding. Transfused 1 unit on May of 9, due to significant weakness and monitor clinically. Monitor serial H&H.  7. Obesity hypoventilation syndrome. Resume home CPap. Patient unable to tolerate hospital mask.  Pain management: Tylenol when necessary Activity: physical therapy SNF Bowel regimen: last BM 10/14/2015 stool soft increased Diet: Renal diet DVT Prophylaxis: subcutaneous Heparin  Advance goals of care discussion: Full code  Family Communication: no family was present at bedside, at the time of interview.   Disposition:  Discharge to SNF pending clip, and resolution of constipation Expected discharge date: 10/17/2015  Consultants: Nephrology, vascular surgery Procedures: Hemodialysis, TDC placement  Antibiotics: Anti-infectives    Start     Dose/Rate Route Frequency Ordered Stop   10/08/15 1515  cefUROXime (ZINACEF) 1.5 g in dextrose 5 % 50 mL IVPB      1.5 g 100 mL/hr over 30 Minutes Intravenous To Surgery 10/08/15 1500 10/09/15 1515        Intake/Output Summary (Last  24 hours) at 10/15/15 1509 Last data filed at 10/15/15 1245  Gross per 24 hour  Intake    480 ml  Output   3114 ml  Net  -2634 ml   Filed Weights   10/10/15 0810 10/10/15 1211 10/12/15 1213  Weight: 180 kg (396 lb 13.3 oz) 176 kg (388 lb 0.2 oz) 178 kg (392 lb 6.7 oz)    Objective: Physical Exam: Filed Vitals:   10/15/15 1208 10/15/15 1223 10/15/15 1238 10/15/15 1245  BP: 135/85 139/84 102/27 109/69  Pulse: 90 92 95 94  Temp:   98.6 F (37 C)   TempSrc:      Resp:   14   Height:      Weight:      SpO2:       General: Alert, Awake and Oriented to Time, Place and Person. Appear in no distress ENT: Oral Mucosa clear moist. Cardiovascular: S1 and S2 Present, aortic systolic Murmur, Respiratory: Bilateral Air entry equal and Decreased,  Clear to Auscultation, no Crackles, no wheezes Abdomen: Bowel Sound present, Soft and no tenderness Extremities: trace Pedal edema, no calf tenderness Neurologic: Grossly no focal neuro deficit. Bilaterally Equal motor strength 5 out of 5  Data Reviewed: CBC:  Recent Labs Lab 10/11/15 1030 10/12/15 0830 10/13/15 0653 10/14/15 0652 10/15/15 0835  WBC 10.7* 12.0* 10.3 9.6 11.6*  NEUTROABS  --  9.5*  --   --   --   HGB 8.0* 10.2* 8.9* 9.4* 9.3*  HCT 25.4* 31.7* 28.6* 31.9* 30.3*  MCV 84.1 88.1 88.3 90.9 88.9  PLT 201 240 217 272 [id number]   Basic Metabolic Panel:  Recent Labs Lab 10/11/15 0559 10/12/15 0627 10/13/15 0653 10/14/15 0652 10/15/15 0838  NA 140 140 141 141 139  K 5.7* 4.0 3.6 4.5 3.9  CL 100* 99* 97* 98* 97*  CO2 23 25 30 30  32  GLUCOSE 83 101* 109* 97 128*  BUN 32* 33* 16 25* 33*  CREATININE 6.09* 7.55* 4.87* 6.53* 8.29*  CALCIUM 8.5* 8.2* 8.4* 8.7* 8.6*  MG  --  2.1  --   --   --   PHOS 5.4* 5.6* 3.7 4.4 4.6    Liver Function Tests:  Recent Labs Lab 10/11/15 0559 10/12/15 0627  10/13/15 0653 10/14/15 0652 10/15/15 0838  AST  --  50*  --   --   --   ALT  --  41  --   --   --   ALKPHOS  --  129*  --   --   --   BILITOT  --  0.4  --   --   --   PROT  --  5.7*  --   --   --   ALBUMIN 1.6* 1.6* 1.6* 1.9* 1.9*   No results for input(s): LIPASE, AMYLASE in the last 168 hours. No results for input(s): AMMONIA in the last 168 hours. Coagulation Profile: No results for input(s): INR, PROTIME in the last 168 hours. Cardiac Enzymes:  Recent Labs Lab 10/11/15 1557  CKTOTAL 125   BNP (last 3 results) No results for input(s): PROBNP in the last 8760 hours.  CBG:  Recent Labs Lab 10/14/15 0840 10/14/15 1202 10/14/15 1747 10/14/15 1952 10/15/15 0809  GLUCAP 85 112* 108* 158* 93    Studies: Dg Abd Portable 1v  10/15/2015  CLINICAL DATA:  Abdominal pain. EXAM: PORTABLE ABDOMEN - 1 VIEW COMPARISON:  10/14/2015 FINDINGS: Examination is limited due to patient's body habitus. Visualized abdomen demonstrates gas  and stool in the colon. No small or large bowel distention. Calcification in the right upper quadrant consistent with gallstone. Surgical clips in the right lower quadrant. No radiopaque stones identified. IMPRESSION: Nonobstructive bowel gas pattern with scattered gas and stool in the colon. Cholelithiasis. Electronically Signed   By: Lucienne Capers M.D.   On: 10/15/2015 02:34     Scheduled Meds: . sodium chloride   Intravenous Once  . sodium chloride   Intravenous Once  . amLODipine  10 mg Oral QHS  . aspirin EC  81 mg Oral Daily  . atorvastatin  40 mg Oral Daily  . darbepoetin (ARANESP) injection - DIALYSIS  100 mcg Intravenous Q Wed-HD  . doxercalciferol  1 mcg Intravenous Q M,W,F-HD  . febuxostat  40 mg Oral Daily  . ferric gluconate (FERRLECIT/NULECIT) IV  125 mg Intravenous Q M,W,F-HD  . heparin subcutaneous  5,000 Units Subcutaneous Q8H  . lactulose  10 g Oral BID  . metoprolol succinate  50 mg Oral BID  . multivitamin with minerals  1  tablet Oral Daily  . neomycin-bacitracin-polymyxin   Topical Daily  . polyethylene glycol  17 g Oral Daily  . senna-docusate  1 tablet Oral BID  . sevelamer carbonate  2,400 mg Oral TID WC  . sodium chloride flush  3 mL Intravenous Q12H   Continuous Infusions:   PRN Meds: acetaminophen **OR** acetaminophen, bisacodyl, hydrALAZINE, hydrocortisone cream, lactulose, lip balm, nitroGLYCERIN, ondansetron **OR** ondansetron (ZOFRAN) IV  Time spent: 30 minutes  Author: Berle Mull, MD [facility] Pager: [phone] 10/15/2015 3:09 PM  If 7PM-7AM, please contact night-coverage at [email or web address], password [facility]

## 2015-10-15 NOTE — Progress Notes (Signed)
Acid bath changed to 4K for potassium of 3.9 per protocol.

## 2015-10-15 NOTE — Progress Notes (Signed)
Patient arrived to unit by chair.  Reviewed treatment plan and this RN agrees with plan.  Report received from bedside RN, Mitzi Hansen.  Consent verified.  Patient A & O X 4.   Lung sounds diminished to ausculation in all fields. Generalized, BLE edema. Cardiac:  NSR.  Removed caps and cleansed RIJ catheter with chlorhedxidine.  Aspirated ports of heparin and flushed them with saline per protocol.  Connected and secured lines, initiated treatment at [place].  UF Goal of 5563mL and net fluid removal 5L.  Will continue to monitor.

## 2015-10-15 NOTE — Progress Notes (Signed)
[facility] Rounding Note  Subjective: Denies any acute complaints today. States she hasn't had PT since last week, but she has been working on moving her arms and legs and rolling over in bed on her own and has noted some improvement.  Physical Examination: BP 140/62 mmHg  Pulse 85  Temp(Src) 99.2 F (37.3 C) (Oral)  Resp 18  Ht 5\' 6"  (1.676 m)  Wt 392 lb 6.7 oz (178 kg)  BMI 63.37 kg/m2  SpO2 96%  General: obese female sitting in chair in no apparent distress Heart: Regular. SS2 No S3. Heart sounds distant.  Lungs: Limited secondary to patient's habitus. Distant breath sounds, but air movement noted bilaterally.  Abdomen: Obese, no tenderness Extremities: +  lower extremity edema noted with signs of chronic venous stasis changes.  Swelling in L upper arm from AVF infiltration last week decreasing Neuro: alert, no focal deficits R IJ TDC (5/9)   Weight 5/10      176 <<180 kg with HD 5/12   178kg  Lab Results  Component Value Date   IRON 30 10/07/2015   TIBC 118* 10/07/2015   FERRITIN 47 10/07/2015  TSat  26%   Recent Labs  10/13/15 0653 10/14/15 0652  NA 141 141  K 3.6 4.5  CL 97* 98*  CO2 30 30  GLUCOSE 109* 97  BUN 16 25*  CREATININE 4.87* 6.53*  CALCIUM 8.4* 8.7*  PHOS 3.7 4.4     Recent Labs  10/13/15 0653 10/14/15 0652  ALBUMIN 1.6* 1.9*   Lab Results  Component Value Date   PTH 570* 10/07/2015   PTH Comment 10/07/2015   CALCIUM 8.7* 10/14/2015   CAION 1.07* 10/05/2015   PHOS 4.4 10/14/2015      Recent Labs  10/12/15 0830 10/13/15 0653 10/14/15 0652  WBC 12.0* 10.3 9.6  NEUTROABS 9.5*  --   --   HGB 10.2* 8.9* 9.4*  HCT 31.7* 28.6* 31.9*  MCV 88.1 88.3 90.9  PLT 240 217 272    Dg Chest 2 View  10/13/2015  CLINICAL DATA:  60 year old female with shortness of breath. EXAM: CHEST  2 VIEW COMPARISON:  Chest x-ray 10/09/2015. FINDINGS: Right internal jugular Vas-Cath with tip terminating in the mid to distal  superior vena cava. Lung volumes are slightly low. There is cephalization of the pulmonary vasculature and slight indistinctness of the interstitial markings suggestive of mild pulmonary edema. No pleural effusions. Mild cardiomegaly. Upper mediastinal contours are within normal limits allowing for patient rotation to the right and lordotic positioning. IMPRESSION: 1. The appearance of the chest suggests mild congestive heart failure, as above. Electronically Signed   By: Vinnie Langton M.D.   On: 10/13/2015 13:40   Dg Abd Portable 1v  10/15/2015  CLINICAL DATA:  Abdominal pain. EXAM: PORTABLE ABDOMEN - 1 VIEW COMPARISON:  10/14/2015 FINDINGS: Examination is limited due to patient's body habitus. Visualized abdomen demonstrates gas and stool in the colon. No small or large bowel distention. Calcification in the right upper quadrant consistent with gallstone. Surgical clips in the right lower quadrant. No radiopaque stones identified. IMPRESSION: Nonobstructive bowel gas pattern with scattered gas and stool in the colon. Cholelithiasis. Electronically Signed   By: Lucienne Capers M.D.   On: 10/15/2015 02:34   Dg Abd Portable 1v  10/14/2015  CLINICAL DATA:  Constipation EXAM: PORTABLE ABDOMEN - 1 VIEW COMPARISON:  CT abdomen 05/02/2009 FINDINGS: Image quality limited by large patient size Distended stomach.  Mildly distended colon.  Small bowel nondilated.  Right upper quadrant calcification compatible gallstone as noted on CT. IMPRESSION: Limited exam.  Mild colonic ileus. Electronically Signed   By: Franchot Gallo M.D.   On: 10/14/2015 13:47   Medications . sodium chloride   Intravenous Once  . sodium chloride   Intravenous Once  . amLODipine  10 mg Oral QHS  . aspirin EC  81 mg Oral Daily  . atorvastatin  40 mg Oral Daily  . darbepoetin (ARANESP) injection - DIALYSIS  100 mcg Intravenous Q Wed-HD  . doxercalciferol  1 mcg Intravenous Q M,W,F-HD  . febuxostat  40 mg Oral Daily  . ferric gluconate  (FERRLECIT/NULECIT) IV  125 mg Intravenous Q M,W,F-HD  . heparin subcutaneous  5,000 Units Subcutaneous Q8H  . lactulose  10 g Oral BID  . metoprolol succinate  50 mg Oral BID  . multivitamin with minerals  1 tablet Oral Daily  . neomycin-bacitracin-polymyxin   Topical Daily  . polyethylene glycol  17 g Oral Daily  . senna-docusate  1 tablet Oral BID  . sevelamer carbonate  2,400 mg Oral TID WC  . sodium chloride flush  3 mL Intravenous Q12H   Background 60 y.o. 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 CXR pulm edema, K >7. Had urgent HD initiated 5/6 via temp cath, (has AVF that is too deep to be usable and was infiltrated with attempt to use). New ESRD with outpt planning underway, mobility biggest deterrent to outpt HD. HD initiated 10/05/15  Assessment/Plan:  1. AKI on CKD4 ->new ESRD.  Temporary catheter placed 5/6. s/p Diatek placement by [facility] 5/9 (AVF to be superficialized at a later date - see their note). Last HD 5/12, next due 5/15  CLIP process has been placed on hold as she is not a candidate for outpatient HD locally at this time - she is too large for "standard" Harrel Lemon lift (tried here - she did not fit) and "Big Boy" recliner too large for the doorways at outpatient HD centers. Will need try to find a place to accommodate her. 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 PTH elevated to 570. Started VDRA with HD (not sure if she will go to a unit that uses hectorol or calcitriol - for now using hectorol 1 mcg TIW with HD) 3. Normocytic Anemia. hgb 7-8 during this admission. S/p 1U pRBC  5/12. (4 units total during this admission). Continue Aranesp 100 QWED and getting load of iron with HD 4. Gout. On uloric.  5. HTN. On metoprolol and amlodipine 6. T2DM. On SSI.  7. HLD. On statin and aspirin per primary.  8. Severe immobility - was walking  PTA (sister confirms pt attended church week before admission), now with significantly limited mobility. Will need to follow with PT inpatient. May benefit from [facility] on discharge but not sure they will take her if HD only reason 9. Left arm fistula (07/2015 Dr. Bridgett Larsson): [facility] aware that AVF needs superficialization. Per their note 5/8 will relook  at once hematoma resolved from infiltration  Dr. Junie Panning, DO, PGY-2

## 2015-10-15 NOTE — Progress Notes (Addendum)
Patient has c/o lower abdominal cramping throughout the night that has increased in severity.  At 2224, the pain level was 7/10 and 650 mg Tylenol PO given with little relief.  Her last documented BM was 10/07/2015.  She is passing gas and has felt like she needs to have a BM.  During the night, she has had small smears but no significant stool.  Her bowel sounds are faint but audible in all 4 quadrants.  She did state that if she takes Miralax she has cramping but not to this severity.  She was given Miralax during AM shift.  [facility] made aware.  Order for STAT portable abdominal xray received.  Also, will give ordered IV Morphine.  Will continue to monitor patient.  [employer] RN-BC, WTA.  Patient refused IV Morphine secondary to having a seizure in the past when given Morphine.  [facility] reviewed stat portable abd xray.  Docusate sodium enema will be given.  Also, gave Ultram 50 mg for abdominal cramping.  Will continue to monitor patient.  Earleen Reaper RN-BC, [employer]

## 2015-10-15 NOTE — Progress Notes (Signed)
OT Cancellation    10/15/15 1000  OT Visit Information  Last OT Received On 10/15/15  Reason Eval/Treat Not Completed Patient at procedure or test/ unavailable (at HD)  will attmept later or tomorrow.  [facility], OTR/L  [phone] 10/15/2015

## 2015-10-15 NOTE — Progress Notes (Signed)
Dialysis treatment terminated early per nephrologist due to X 2 clotted venous chamber.  4114 mL ultrafiltrated.  3114 mL net fluid removal.  Patient status unchanged. Lung sounds diminsihed to ausculation in all fields. Generalized edema. Cardiac: NSR.  Cleansed RIJ catheter with chlorhexidine.  Disconnected lines and flushed ports with saline per protocol.  Ports locked with heparin and capped per protocol.    Report given to bedside, RN Jonni Sanger.

## 2015-10-16 LAB — CBC
HEMATOCRIT: 31.6 % — AB (ref 36.0–46.0)
Hemoglobin: 9.7 g/dL — ABNORMAL LOW (ref 12.0–15.0)
MCH: 27.3 pg (ref 26.0–34.0)
MCHC: 30.7 g/dL (ref 30.0–36.0)
MCV: 89 fL (ref 78.0–100.0)
Platelets: 261 10*3/uL (ref 150–400)
RBC: 3.55 MIL/uL — ABNORMAL LOW (ref 3.87–5.11)
RDW: 15.3 % (ref 11.5–15.5)
WBC: 14 10*3/uL — ABNORMAL HIGH (ref 4.0–10.5)

## 2015-10-16 LAB — RENAL FUNCTION PANEL
Albumin: 1.9 g/dL — ABNORMAL LOW (ref 3.5–5.0)
Anion gap: 14 (ref 5–15)
BUN: 24 mg/dL — AB (ref 6–20)
CHLORIDE: 95 mmol/L — AB (ref 101–111)
CO2: 29 mmol/L (ref 22–32)
CREATININE: 6.04 mg/dL — AB (ref 0.44–1.00)
Calcium: 8.9 mg/dL (ref 8.9–10.3)
GFR calc Af Amer: 8 mL/min — ABNORMAL LOW (ref >60)
GFR calc non Af Amer: 7 mL/min — ABNORMAL LOW (ref >60)
GLUCOSE: 88 mg/dL (ref 65–99)
POTASSIUM: 4.3 mmol/L (ref 3.5–5.1)
Phosphorus: 3.8 mg/dL (ref 2.5–4.6)
Sodium: 138 mmol/L (ref 135–145)

## 2015-10-16 LAB — GLUCOSE, CAPILLARY
GLUCOSE-CAPILLARY: 93 mg/dL (ref 65–99)
GLUCOSE-CAPILLARY: 80 mg/dL (ref 65–99)
Glucose-Capillary: 78 mg/dL (ref 65–99)
Glucose-Capillary: 73 mg/dL (ref 65–99)

## 2015-10-16 MED ORDER — SENNOSIDES-DOCUSATE SODIUM 8.6-50 MG PO TABS: 2.0000 | ORAL_TABLET | Freq: Two times a day (BID) | ORAL | Status: DC | PRN

## 2015-10-16 NOTE — Progress Notes (Signed)
Admit: 10/04/2015 LOS: 35  34F new ESRD, morbid obesity limiting access to outpt HD, DM2, Deconditioning/impaired mobility  Subjective:  HD yesterday, truncated 2/2 clotting 3L UF Heparin was held, hx/o infiltration Using [facility]   05/15 0701 - 05/16 0700 In: 490 [P.O.:490] Out: 3116 [Stool:2]  Filed Weights   10/10/15 0810 10/10/15 1211 10/12/15 1213  Weight: 180 kg (396 lb 13.3 oz) 176 kg (388 lb 0.2 oz) 178 kg (392 lb 6.7 oz)    Scheduled Meds: . amLODipine  10 mg Oral QHS  . aspirin EC  81 mg Oral Daily  . atorvastatin  40 mg Oral Daily  . darbepoetin (ARANESP) injection - DIALYSIS  100 mcg Intravenous Q Wed-HD  . doxercalciferol  1 mcg Intravenous Q M,W,F-HD  . febuxostat  40 mg Oral Daily  . ferric gluconate (FERRLECIT/NULECIT) IV  125 mg Intravenous Q M,W,F-HD  . heparin subcutaneous  5,000 Units Subcutaneous Q8H  . lactulose  10 g Oral BID  . metoprolol succinate  50 mg Oral BID  . multivitamin with minerals  1 tablet Oral Daily  . neomycin-bacitracin-polymyxin   Topical Daily  . polyethylene glycol  17 g Oral BID  . sevelamer carbonate  2,400 mg Oral TID WC  . sodium chloride flush  3 mL Intravenous Q12H   Continuous Infusions:  PRN Meds:.acetaminophen **OR** acetaminophen, hydrALAZINE, hydrocortisone cream, lactulose, lip balm, nitroGLYCERIN, ondansetron **OR** ondansetron (ZOFRAN) IV, senna-docusate  Current Labs: reviewed    Physical Exam:  Blood pressure 111/46, pulse 98, temperature 98.9 F (37.2 C), temperature source Oral, resp. rate 16, height 5\' 6"  (1.676 m), weight 178 kg (392 lb 6.7 oz), SpO2 98 %. Morbidly obese, NAD LUE AVF B/T+, hematoma stable RIJ TDC RRR no rub Coarse bs ant ABD obese, soft  A 1. New ESRD 2. Morbid Obesity / Impaired Mobility / Deconditioning 3. Anemia: on ESA qWed, rec Fe 4. BMD: On VDRA and Renvela 5. Recent AVF infiltration, [facility] following 6. HLD 7. DM2 8. HTN 9. Gout  P 1. HD again tomorrow with heparin 2. Cont  PT/OT   Pearson Grippe MD 10/16/2015, 2:46 PM   Recent Labs Lab 10/14/15 [phone] 10/15/15 0838 10/16/15 0632  NA 141 139 138  K 4.5 3.9 4.3  CL 98* 97* 95*  CO2 30 32 29  GLUCOSE 97 128* 88  BUN 25* 33* 24*  CREATININE 6.53* 8.29* 6.04*  CALCIUM 8.7* 8.6* 8.9  PHOS 4.4 4.6 3.8    Recent Labs Lab 10/12/15 0830  10/14/15 0652 10/15/15 0835 10/16/15 0632  WBC 12.0*  < > 9.6 11.6* 14.0*  NEUTROABS 9.5*  --   --   --   --   HGB 10.2*  < > 9.4* 9.3* 9.7*  HCT 31.7*  < > 31.9* 30.3* 31.6*  MCV 88.1  < > 90.9 88.9 89.0  PLT 240  < > 272 252 261  < > = values in this interval not displayed.

## 2015-10-16 NOTE — Plan of Care (Signed)
Problem: Bowel/Gastric: Goal: Will not experience complications related to bowel motility Outcome: Progressing Pt had multiple bowel movements tonight

## 2015-10-16 NOTE — Progress Notes (Signed)
Rehab Admissions Coordinator Note:  Patient was screened by Cleatrice Burke for appropriateness for an Inpatient Acute Rehab Consult per OT recommendation.PT recommends SNF. At this time, we are recommending [place]. [place] Medicare unlikely to approve an inpt hospital rehab admission for this diagnosis.  Cleatrice Burke 10/16/2015, 12:37 PM  I can be reached at [phone].

## 2015-10-16 NOTE — Progress Notes (Signed)
Patient is new diagnosis ESRD and is awaiting CLIP for outpatient HD center. This has become a challenge due to patient's body habitus, inability to use hoyer lift/maximove, and possibly of not having a large enough recliner at the center. Spoke with the HD secretary, Bethena Roys, who stated she would send out information to [facility] centers and hope to get some answers from them. None of the [facility] centers are willing to accept her at this time. Will continue to follow patient's progress with PT/OT.   Joellen Jersey, RN.

## 2015-10-16 NOTE — Progress Notes (Signed)
[facility] Progress Note  Patient: [name] [id number]   PCP: Dwan Bolt, MD DOB: August 11, 1955   DOA: 10/04/2015   DOS: 10/16/2015   Date of Service: the patient was seen and examined on 10/16/2015  Subjective: Patient had diarrhea overnight. Denies having any abdominal cramps. No nausea no vomiting. No chest pain or shortness of breath. Fatigue is improving Nutrition: Tolerating oral diet  Brief hospital course: Patient was admitted on 10/04/2015, with complaint of shortness of breath progressively worsening without any chest pain or cough or fever or chills, was found to have acute on chronic diastolic dysfunction with elevated troponin and elevated creatinine. Patient also had hyperkalemia with metabolic acidosis. Patient was treated conservatively with Lasix in the ER but later on transferred to [facility] on admission. Initial AV fistula hemodialysis was not successful and temporary hemodialysis catheter was placed. As per vascular surgery the AV fistula will be superficialized.  Currently further plan is continue hemodialysis and arrange for outpatient discharge HD.  Assessment and Plan: 1. Acute pulmonary edema (HCC) Suspected acute on chronic diastolic dysfunction. Acute on chronic kidney disease stage IV New ESRD Acute hypoxic respiratory failure Hyperkalemia.  Oxygenation is significantly improving with fluid removal. Initially attempted with the Lasix but did not respond and currently on hemodialysis. Left arm fistula placed in 07/25/2015. Unable to use at present due to being too deep. Tunneled HD catheter on May 9. Hemodialysis started on May 5 with temporary catheter and will need lifelong hemodialysis an outpatient HD clip Oxygen requirement has returned to room air during the day Chest x-ray on 10/13/2015 shows evidence of mild CHF congestion Potassium was 7.1 on admission currently resolved with hemodialysis. AV fistula will require future  procedures later and patient will need outpatient follow-up with vascular surgery. Patient will need to be able to tolerate hemodialysis in chair in an outpatient setting which she is unable to perform at present and therefore discharge was held up  2. Generalized fatigue and weakness with physical deconditioning. CPK normal, not on any psychotropic medication. Phosphorus levels adequately elevated. TSH & cortisol level unremarkable. Hemodynamically stable. Blood cultures no growth to date. H&H improved after transfusion, improvement in weakness after transfusion. At present the etiology of her generalized fatigue and weakness is most likely physical deconditioning. Patient will need significant physical therapy. As per CIR not a candidate for CIR and will need SNF.  3. Elevated troponin. Troponin was 11 on May 6. Which is a false positive. Echocardiogram does not show any acute wall motion abnormality EKG does not show any acute ischemic changes. Currently on aspirin and statin and beta blocker. Cardiology was consulted and appreciate input.  4. Acute encephalopathy. Currently resolving. Avoid sedating medications.  5. Essential hypertension. Blood pressure getting stable after hemodialysis initiation. Continue amlodipine and metoprolol.  6. Anemia of chronic kidney disease. Iron studies, folic acid, TSH within normal limits. B-12 elevated. No active bleeding. Transfused 1 unit on May of 9, due to significant weakness and monitor clinically. Monitor serial H&H.  7. Obesity hypoventilation syndrome. Resume home CPap. Patient unable to tolerate hospital mask.  8. Constipation. Currently resolved. Continue MiraLAX as scheduled and Senokot as needed.  Pain management: Tylenol when necessary Activity: physical therapy SNF Bowel regimen: last BM 10/16/2015 stool soft  Diet: Renal diet DVT Prophylaxis: subcutaneous Heparin  Advance goals of care discussion: Full code  Family  Communication: family was present at bedside, at the time of interview. The pt provided permission to discuss medical plan  with the family. Opportunity was given to ask question and all questions were answered satisfactorily.   Disposition:  Discharge to SNF pending clip, Expected discharge date: 10/19/2015  Consultants: Nephrology, vascular surgery Procedures: Hemodialysis, TDC placement  Antibiotics: Anti-infectives    Start     Dose/Rate Route Frequency Ordered Stop   10/08/15 1515  cefUROXime (ZINACEF) 1.5 g in dextrose 5 % 50 mL IVPB     1.5 g 100 mL/hr over 30 Minutes Intravenous To Surgery 10/08/15 1500 10/09/15 1515        Intake/Output Summary (Last 24 hours) at 10/16/15 1956 Last data filed at 10/16/15 1903  Gross per 24 hour  Intake    460 ml  Output      0 ml  Net    460 ml   Filed Weights   10/10/15 0810 10/10/15 1211 10/12/15 1213  Weight: 180 kg (396 lb 13.3 oz) 176 kg (388 lb 0.2 oz) 178 kg (392 lb 6.7 oz)    Objective: Physical Exam: Filed Vitals:   10/15/15 1757 10/15/15 2114 10/16/15 0942 10/16/15 1641  BP: 112/55 142/77 111/46 147/88  Pulse: 95 95 98 93  Temp: 98.3 F (36.8 C) 98.3 F (36.8 C) 98.9 F (37.2 C) 98.7 F (37.1 C)  TempSrc: Oral Oral Oral Axillary  Resp: 16 17 16 17   Height:      Weight:      SpO2:  99% 98% 100%   General: Alert, Awake and Oriented to Time, Place and Person. Appear in no distress ENT: Oral Mucosa clear moist. Cardiovascular: S1 and S2 Present, aortic systolic Murmur, Respiratory: Bilateral Air entry equal and Decreased,  Clear to Auscultation, no Crackles, no wheezes Abdomen: Bowel Sound present, Soft and no tenderness Extremities: trace Pedal edema, no calf tenderness Neurologic: Grossly no focal neuro deficit. Bilaterally Equal motor strength 4 out of 5  Data Reviewed: CBC:  Recent Labs Lab 10/12/15 0830 10/13/15 0653 10/14/15 0652 10/15/15 0835 10/16/15 0632  WBC 12.0* 10.3 9.6 11.6* 14.0*    NEUTROABS 9.5*  --   --   --   --   HGB 10.2* 8.9* 9.4* 9.3* 9.7*  HCT 31.7* 28.6* 31.9* 30.3* 31.6*  MCV 88.1 88.3 90.9 88.9 89.0  PLT 240 217 272 252 0000000   Basic Metabolic Panel:  Recent Labs Lab 10/12/15 0627 10/13/15 0653 10/14/15 0652 10/15/15 0838 10/16/15 0632  NA 140 141 141 139 138  K 4.0 3.6 4.5 3.9 4.3  CL 99* 97* 98* 97* 95*  CO2 25 30 30  32 29  GLUCOSE 101* 109* 97 128* 88  BUN 33* 16 25* 33* 24*  CREATININE 7.55* 4.87* 6.53* 8.29* 6.04*  CALCIUM 8.2* 8.4* 8.7* 8.6* 8.9  MG 2.1  --   --   --   --   PHOS 5.6* 3.7 4.4 4.6 3.8    Liver Function Tests:  Recent Labs Lab 10/12/15 0627 10/13/15 0653 10/14/15 0652 10/15/15 0838 10/16/15 0632  AST 50*  --   --   --   --   ALT 41  --   --   --   --   ALKPHOS 129*  --   --   --   --   BILITOT 0.4  --   --   --   --   PROT 5.7*  --   --   --   --   ALBUMIN 1.6* 1.6* 1.9* 1.9* 1.9*   No results for input(s): LIPASE, AMYLASE in the last 168  hours. No results for input(s): AMMONIA in the last 168 hours. Coagulation Profile: No results for input(s): INR, PROTIME in the last 168 hours. Cardiac Enzymes:  Recent Labs Lab 10/11/15 1557  CKTOTAL 125   BNP (last 3 results) No results for input(s): PROBNP in the last 8760 hours.  CBG:  Recent Labs Lab 10/15/15 1652 10/15/15 2141 10/16/15 0804 10/16/15 1118 10/16/15 1604  GLUCAP 128* 110* 78 73 93    Studies: No results found.   Scheduled Meds: . amLODipine  10 mg Oral QHS  . aspirin EC  81 mg Oral Daily  . atorvastatin  40 mg Oral Daily  . darbepoetin (ARANESP) injection - DIALYSIS  100 mcg Intravenous Q Wed-HD  . doxercalciferol  1 mcg Intravenous Q M,W,F-HD  . febuxostat  40 mg Oral Daily  . ferric gluconate (FERRLECIT/NULECIT) IV  125 mg Intravenous Q M,W,F-HD  . heparin subcutaneous  5,000 Units Subcutaneous Q8H  . lactulose  10 g Oral BID  . metoprolol succinate  50 mg Oral BID  . multivitamin with minerals  1 tablet Oral Daily  .  neomycin-bacitracin-polymyxin   Topical Daily  . polyethylene glycol  17 g Oral BID  . sevelamer carbonate  2,400 mg Oral TID WC  . sodium chloride flush  3 mL Intravenous Q12H   Continuous Infusions:   PRN Meds: acetaminophen **OR** acetaminophen, hydrALAZINE, hydrocortisone cream, lactulose, lip balm, nitroGLYCERIN, ondansetron **OR** ondansetron (ZOFRAN) IV, senna-docusate  Time spent: 30 minutes  Author: Berle Mull, MD [facility] Pager: [phone] 10/16/2015 7:56 PM  If 7PM-7AM, please contact night-coverage at [email or web address], password [facility]

## 2015-10-16 NOTE — Progress Notes (Signed)
Occupational Therapy Treatment Patient Details Name: [name] MRN: [id number] DOB: Aug 29, 1955 Today's Date: 10/16/2015    History of present illness 60 year old female presenting with 10 days of progressive dyspnea. PMH is significant for CKD, HTN, T2DM, and seizure disorder   OT comments  Pt very motivated and progressing well with therapy.  Pt stood and pivot transferred for first time today to chair. 3 people available for transfer (2 on the side and one in front) but not needed.  Once pt on her feet, pt pivoted to chair with +2 min assist (appx 3 feet/7 steps).  Educated pt on necessary pressure relief while she sits in chair by leaning R to L and pulling up on pull bars in front of her.  Pt did require +2/3 max assist to get onto her feet but feel she could have done this with +2.  Pt much more independent with adls in chair.  Will continue to focus on adl mobilization.  Follow Up Recommendations  CIR;Supervision/Assistance - 24 hour    Equipment Recommendations  3 in 1 bedside comode;Tub/shower bench    Recommendations for Other Services Rehab consult    Precautions / Restrictions Precautions Precautions: Fall Restrictions Weight Bearing Restrictions: No       Mobility Bed Mobility Overal bed mobility: +2 for physical assistance;Needs Assistance Bed Mobility: Rolling;Sidelying to Sit Rolling: Min assist;+2 for physical assistance Sidelying to sit: Mod assist;+2 for physical assistance       General bed mobility comments: With verbal cues for technique, pts independence much improved with bed mobility.    Transfers Overall transfer level: Needs assistance Equipment used: 2 person hand held assist;Rolling walker (2 wheeled) Transfers: Sit to/from [facility] Sit to Stand: Mod assist;+2 physical assistance (extra person in front to stabilize along with +2) Stand pivot transfers: Mod assist;+2 physical assistance       General transfer comment:  Once up pt did better pivoting to the L to her chair.  Pt fatigues quickly but by last few steps was +2 min assist.      Balance Overall balance assessment: Needs assistance Sitting-balance support: Feet supported;Bilateral upper extremity supported Sitting balance-Leahy Scale: Fair Sitting balance - Comments: Once pt moved hips forward and supported feet pt maintained good sitting balance.   Standing balance support: Bilateral upper extremity supported;During functional activity Standing balance-Leahy Scale: Poor Standing balance comment: PT must have outside assist to stand. Pt needs physical assist and walker.                   ADL Overall ADL's : Needs assistance/impaired Eating/Feeding: Independent;Sitting   Grooming: Set up;Sitting Grooming Details (indicate cue type and reason): Pt transferred to chair and sat in chair to groom with setup.  Pt also educated on how to adjust her bed [place] when in bed with hand control. Upper Body Bathing: Minimal assitance;Sitting Upper Body Bathing Details (indicate cue type and reason): Pt encouraged to start bathing upper body at all times when nursing comes in to offer a bath.  Pt can reach all but her back and feel this would increase her strength and endurance for activity. Lower Body Bathing: Maximal assistance;Bed level           Toilet Transfer: +2 for physical assistance;Moderate assistance;Stand-pivot;Requires wide/bariatric (simulated to bari chair) Toilet Transfer Details (indicate cue type and reason): Pt lifted buttocks from bed on first attempt with +3 assist.  One person stood in front of pt with sheet gait belt  around pt while two other stood next to pt.  Feel pt could get up with +2 assist on next attempt. Toileting- Clothing Manipulation and Hygiene: Total assistance;+2 for physical assistance;Sit to/from stand Toileting - Clothing Manipulation Details (indicate cue type and reason): Pt is unable at this time to let go of  the walker to be able to clean self in standing after toileting.       Functional mobility during ADLs: +2 for physical assistance;Maximal assistance (Once up on feet pt was mod assist +2. ) General ADL Comments: Pt sat EOB appx 6 minutes before transferring to chair.  Needed max assist to weight shift side to side to move forward on the bed but did well with the task.  When moving back into the chair pt was able to weight shift and move back with assist only to block/prop her feet so pt had something to push from.      Vision                     Perception     Praxis      Cognition   Behavior During Therapy: [facility] for tasks assessed/performed Overall Cognitive Status: Within Functional Limits for tasks assessed                       Extremity/Trunk Assessment               Exercises Other Exercises Other Exercises: spoke with pt about continuing to use the theraband attached to her bed to exercise.   Shoulder Instructions       General Comments      Pertinent Vitals/ Pain       Pain Assessment: Faces Pain Score: 4  Faces Pain Scale: Hurts little more Pain Location: back of L leg when sitting EOB.  Hitting bedrail. Pain Descriptors / Indicators: Aching Pain Intervention(s): Limited activity within patient's tolerance;Monitored during session;Repositioned  Home Living                                          Prior Functioning/Environment              Frequency Min 2X/week     Progress Toward Goals  OT Goals(current goals can now be found in the care plan section)  Progress towards OT goals: Progressing toward goals  Acute Rehab OT Goals Patient Stated Goal: be able to return home OT Goal Formulation: With patient Time For Goal Achievement: 10/23/15 Potential to Achieve Goals: Good ADL Goals Pt Will Perform Upper Body Bathing: with set-up;sitting Pt Will Perform Upper Body Dressing: with set-up;sitting Pt Will  Transfer to Toilet: with +2 assist;with mod assist;bedside commode;stand pivot transfer Pt Will Perform Toileting - Clothing Manipulation and hygiene: with mod assist;with adaptive equipment;sitting/lateral leans Pt/caregiver will Perform Home Exercise Program: Both right and left upper extremity;With theraband;With Supervision  Plan Discharge plan needs to be updated    Co-evaluation    PT/OT/SLP Co-Evaluation/Treatment: Yes Reason for Co-Treatment: For patient/therapist safety;Complexity of the patient's impairments (multi-system involvement) PT goals addressed during session: Mobility/safety with mobility OT goals addressed during session: ADL's and self-care      End of Session Equipment Utilized During Treatment: Gait belt;Rolling walker   Activity Tolerance Patient tolerated treatment well   Patient Left in chair;with call bell/phone within reach   Nurse Communication Mobility status  Time: 1130-1200 OT Time Calculation (min): 30 min  Charges: OT General Charges $OT Visit: 1 Procedure OT Treatments $Self Care/Home Management : 8-22 mins  Glenford Peers 10/16/2015, 12:21 PM  [phone]

## 2015-10-16 NOTE — Progress Notes (Signed)
Physical Therapy Treatment Patient Details Name: CALE MORAITIS MRN: [id number] DOB: 1955-09-04 Today's Date: 10/16/2015    History of Present Illness 60 year old female presenting with 10 days of progressive dyspnea. PMH is significant for CKD, HTN, T2DM, and seizure disorder    PT Comments    Pt very motivated and progressing well with therapy. Pt stood and pivot transferred for first time today to chair. 3 people available for transfer (2 on the side and one in front) but not needed. Once pt on her feet, pt pivoted to chair with +2 min assist (appx 3 feet/7 steps).  Agree with CIR for post-acute rehab, however, noted her insurance will likely not cover CIR, so will continue to recommend SNF for rehab say to maximize independence and safety with mobility prior to dc home.   Follow Up Recommendations  CIR;Supervision/Assistance - 24 hour (though, noted her insurance will likely not cover CIR)     Equipment Recommendations  Rolling walker with 5" wheels;3in1 (PT) (Bariatric)    Recommendations for Other Services       Precautions / Restrictions Precautions Precautions: Fall Restrictions Weight Bearing Restrictions: No    Mobility  Bed Mobility Overal bed mobility: +2 for physical assistance;Needs Assistance Bed Mobility: Rolling;Sidelying to Sit Rolling: Min assist;+2 for physical assistance Sidelying to sit: Mod assist;+2 for physical assistance       General bed mobility comments: With verbal cues for technique, pts independence much improved with bed mobility.  Min assist to reach full sidelying; mod assist to elevate trunk; good work on weight shift to reciprocally scoot to EOB  Transfers Overall transfer level: Needs assistance Equipment used: 2 person hand held assist;Rolling walker (2 wheeled) Transfers: Sit to/from Stand Sit to Stand: Mod assist;+2 physical assistance (extra person in front to stabilize along with +2) Stand pivot transfers: Mod assist;+2  physical assistance       General transfer comment: Heavy mod assist to power up; Once up pt did better pivoting to the L to her chair.  Pt fatigues quickly but by last few steps was +2 min assist.    Ambulation/Gait Ambulation/Gait assistance: Min assist;+2 physical assistance;+2 safety/equipment Ambulation Distance (Feet): 3 Feet (approx 8 steps bed to chair) Assistive device: Rolling walker (2 wheeled) Gait Pattern/deviations: Shuffle     General Gait Details: noted more difficulty advancing LLE; fatigues quickly, but showing excellent effort   Stairs            Wheelchair Mobility    Modified Rankin (Stroke Patients Only)       Balance Overall balance assessment: Needs assistance Sitting-balance support: Feet supported;Bilateral upper extremity supported Sitting balance-Leahy Scale: Fair Sitting balance - Comments: Once pt moved hips forward and supported feet pt maintained good sitting balance.   Standing balance support: Bilateral upper extremity supported;During functional activity Standing balance-Leahy Scale: Poor Standing balance comment: PT must have outside assist to stand. Pt needs physical assist and walker.                    Cognition Arousal/Alertness: Awake/alert Behavior During Therapy: WFL for tasks assessed/performed Overall Cognitive Status: Within Functional Limits for tasks assessed                      Exercises Other Exercises Other Exercises: spoke with pt about continuing to use the theraband attached to her bed to exercise.    General Comments        Pertinent Vitals/Pain Pain Assessment: Faces  Pain Score: 4  Faces Pain Scale: Hurts little more Pain Location: Back of Leg against bedrail sitting up Pain Descriptors / Indicators: Aching Pain Intervention(s): Limited activity within patient's tolerance;Monitored during session;Repositioned    Home Living                      Prior Function             PT Goals (current goals can now be found in the care plan section) Acute Rehab PT Goals Patient Stated Goal: be able to return home PT Goal Formulation: With patient Time For Goal Achievement: 10/23/15 Potential to Achieve Goals: Good Progress towards PT goals: Progressing toward goals (very nicely)    Frequency  Min 2X/week    PT Plan Current plan remains appropriate    Co-evaluation PT/OT/SLP Co-Evaluation/Treatment: Yes Reason for Co-Treatment: Complexity of the patient's impairments (multi-system involvement);For patient/therapist safety PT goals addressed during session: Mobility/safety with mobility OT goals addressed during session: ADL's and self-care     End of Session Equipment Utilized During Treatment: Gait belt Activity Tolerance: Patient tolerated treatment well Patient left: in chair;with call bell/phone within reach     Time: 1130-1200 PT Time Calculation (min) (ACUTE ONLY): 30 min  Charges:  $Gait Training: 8-22 mins $Therapeutic Activity: 8-22 mins                    G Codes:      Quin Hoop 10/16/2015, 1:25 PM  Roney Marion, [place] Pager [phone] Office [phone]

## 2015-10-17 LAB — RENAL FUNCTION PANEL
ALBUMIN: 1.8 g/dL — AB (ref 3.5–5.0)
Anion gap: 14 (ref 5–15)
BUN: 37 mg/dL — AB (ref 6–20)
CO2: 27 mmol/L (ref 22–32)
CREATININE: 7.33 mg/dL — AB (ref 0.44–1.00)
Calcium: 8.6 mg/dL — ABNORMAL LOW (ref 8.9–10.3)
Chloride: 96 mmol/L — ABNORMAL LOW (ref 101–111)
GFR, EST AFRICAN AMERICAN: 6 mL/min — AB (ref >60)
GFR, EST NON AFRICAN AMERICAN: 5 mL/min — AB (ref >60)
Glucose, Bld: 73 mg/dL (ref 65–99)
PHOSPHORUS: 4.7 mg/dL — AB (ref 2.5–4.6)
POTASSIUM: 4.4 mmol/L (ref 3.5–5.1)
Sodium: 137 mmol/L (ref 135–145)

## 2015-10-17 LAB — COMPREHENSIVE METABOLIC PANEL
ALBUMIN: 1.8 g/dL — AB (ref 3.5–5.0)
ALK PHOS: 117 U/L (ref 38–126)
ALT: 28 U/L (ref 14–54)
ANION GAP: 14 (ref 5–15)
AST: 23 U/L (ref 15–41)
BUN: 36 mg/dL — ABNORMAL HIGH (ref 6–20)
CALCIUM: 8.6 mg/dL — AB (ref 8.9–10.3)
CO2: 27 mmol/L (ref 22–32)
CREATININE: 7.42 mg/dL — AB (ref 0.44–1.00)
Chloride: 96 mmol/L — ABNORMAL LOW (ref 101–111)
GFR calc Af Amer: 6 mL/min — ABNORMAL LOW (ref >60)
GFR calc non Af Amer: 5 mL/min — ABNORMAL LOW (ref >60)
GLUCOSE: 74 mg/dL (ref 65–99)
Potassium: 4.5 mmol/L (ref 3.5–5.1)
SODIUM: 137 mmol/L (ref 135–145)
Total Bilirubin: 0.4 mg/dL (ref 0.3–1.2)
Total Protein: 6.4 g/dL — ABNORMAL LOW (ref 6.5–8.1)

## 2015-10-17 LAB — CULTURE, BLOOD (ROUTINE X 2)
CULTURE: NO GROWTH
Culture: NO GROWTH

## 2015-10-17 LAB — CBC WITH DIFFERENTIAL/PLATELET
BASOS PCT: 0 %
Basophils Absolute: 0 10*3/uL (ref 0.0–0.1)
Eosinophils Absolute: 0.4 10*3/uL (ref 0.0–0.7)
Eosinophils Relative: 4 %
HEMATOCRIT: 30 % — AB (ref 36.0–46.0)
Hemoglobin: 9.3 g/dL — ABNORMAL LOW (ref 12.0–15.0)
LYMPHS ABS: 1.4 10*3/uL (ref 0.7–4.0)
Lymphocytes Relative: 11 %
MCH: 27.2 pg (ref 26.0–34.0)
MCHC: 31 g/dL (ref 30.0–36.0)
MCV: 87.7 fL (ref 78.0–100.0)
MONO ABS: 1.1 10*3/uL — AB (ref 0.1–1.0)
MONOS PCT: 9 %
NEUTROS ABS: 9.4 10*3/uL — AB (ref 1.7–7.7)
Neutrophils Relative %: 76 %
Platelets: 269 10*3/uL (ref 150–400)
RBC: 3.42 MIL/uL — ABNORMAL LOW (ref 3.87–5.11)
RDW: 15.5 % (ref 11.5–15.5)
WBC: 12.4 10*3/uL — ABNORMAL HIGH (ref 4.0–10.5)

## 2015-10-17 LAB — GLUCOSE, CAPILLARY
GLUCOSE-CAPILLARY: 101 mg/dL — AB (ref 65–99)
Glucose-Capillary: 76 mg/dL (ref 65–99)
Glucose-Capillary: 107 mg/dL — ABNORMAL HIGH (ref 65–99)

## 2015-10-17 MED ORDER — DARBEPOETIN ALFA 100 MCG/0.5ML IJ SOSY
PREFILLED_SYRINGE | INTRAMUSCULAR | Status: AC
  Filled 2015-10-17: qty 0.5

## 2015-10-17 MED ORDER — DOXERCALCIFEROL 4 MCG/2ML IV SOLN
INTRAVENOUS | Status: AC
  Filled 2015-10-17: qty 2

## 2015-10-17 NOTE — Procedures (Signed)
I was present at this dialysis session. I have reviewed the session itself and made appropriate changes.   2K bath. Rec tight heparin runing well.  UF goal 4L.  Tolerating Tx well. Next HD planned 5/19.   Recent Labs Lab 10/17/15 0511  NA 137  137  K 4.5  4.4  CL 96*  96*  CO2 27  27  GLUCOSE 74  73  BUN 36*  37*  CREATININE 7.42*  7.33*  CALCIUM 8.6*  8.6*  PHOS 4.7*     Recent Labs Lab 10/12/15 0830  10/15/15 0835 10/16/15 0632 10/17/15 0511  WBC 12.0*  < > 11.6* 14.0* 12.4*  NEUTROABS 9.5*  --   --   --  9.4*  HGB 10.2*  < > 9.3* 9.7* 9.3*  HCT 31.7*  < > 30.3* 31.6* 30.0*  MCV 88.1  < > 88.9 89.0 87.7  PLT 240  < > 252 261 269  < > = values in this interval not displayed.  Scheduled Meds: . amLODipine  10 mg Oral QHS  . aspirin EC  81 mg Oral Daily  . atorvastatin  40 mg Oral Daily  . darbepoetin (ARANESP) injection - DIALYSIS  100 mcg Intravenous Q Wed-HD  . doxercalciferol  1 mcg Intravenous Q M,W,F-HD  . febuxostat  40 mg Oral Daily  . ferric gluconate (FERRLECIT/NULECIT) IV  125 mg Intravenous Q M,W,F-HD  . heparin subcutaneous  5,000 Units Subcutaneous Q8H  . lactulose  10 g Oral BID  . metoprolol succinate  50 mg Oral BID  . multivitamin with minerals  1 tablet Oral Daily  . neomycin-bacitracin-polymyxin   Topical Daily  . polyethylene glycol  17 g Oral BID  . sevelamer carbonate  2,400 mg Oral TID WC  . sodium chloride flush  3 mL Intravenous Q12H   Continuous Infusions:  PRN Meds:.acetaminophen **OR** acetaminophen, hydrALAZINE, hydrocortisone cream, lactulose, lip balm, nitroGLYCERIN, ondansetron **OR** ondansetron (ZOFRAN) IV, senna-docusate   Pearson Grippe  MD 10/17/2015, 9:09 AM

## 2015-10-17 NOTE — Progress Notes (Signed)
PROGRESS NOTE  [name] [id number] DOB: 1955-08-29 DOA: 10/04/2015 PCP: Dwan Bolt, MD   LOS: 12 days   Brief Narrative: Patient was admitted on 10/04/2015, with complaint of shortness of breath progressively worsening without any chest pain or cough or fever or chills, was found to have acute on chronic diastolic dysfunction with elevated troponin and elevated creatinine. Patient also had hyperkalemia with metabolic acidosis. Patient was treated conservatively with Lasix in the ER but later on transferred to [facility] on admission. Initial AV fistula hemodialysis was not successful and temporary hemodialysis catheter was placed. As per vascular surgery the AV fistula will be superficialized. Currently further plan is continue hemodialysis and arrange for outpatient discharge HD.  Assessment & Plan: Principal Problem:   Acute pulmonary edema (HCC) Active Problems:   Morbid obesity ([place])   Renal failure (ARF), acute on chronic (HCC)   Anemia in chronic kidney disease   Type 2 diabetes mellitus with renal complication (HCC)   Hypertension   Elevated troponin   Hypoglycemia   Occult blood positive stool   Chronic anemia   Acute pulmonary edema (HCC) Suspected acute on chronic diastolic dysfunction. Acute on chronic kidney disease stage IV Acute hypoxic respiratory failure - Oxygenation is significantly improving with fluid removal, now fluid management per HD - did not respond to Lasix initially - Left arm fistula placed in 07/25/2015. Unable to use at present due to being too deep. - Tunneled HD catheter on May 9. - Hemodialysis started on May 5 with temporary catheter and will need lifelong hemodialysis an outpatient HD clip - AV fistula will require future procedures later and patient will need outpatient follow-up with vascular surgery. - Patient will need to be able to tolerate hemodialysis in chair in an outpatient setting which she is unable to perform  at present and therefore discharge was held up  Generalized fatigue and weakness with physical deconditioning. - CPK normal, not on any psychotropic medication. Phosphorus levels adequately elevated. - TSH & cortisol level unremarkable. - Hemodynamically stable. Blood cultures no growth to date. - H&H improved after transfusion, improvement in weakness after transfusion. - At present the etiology of her generalized fatigue and weakness is most likely physical deconditioning. - Patient will need significant physical therapy. - As per CIR not a candidate for CIR and will need SNF.  Elevated troponin. - Troponin was 11 on May 6 - Echocardiogram does not show any acute wall motion abnormality EKG does not show any acute ischemic changes. - Currently on aspirin and statin and beta blocker. - Cardiology was consulted and appreciate input, medical management for now  Acute encephalopathy. - Currently resolving. - Avoid sedating medications.  Essential hypertension. - Blood pressure getting stable after hemodialysis initiation. - Continue amlodipine and metoprolol.  Anemia of chronic kidney disease. - Iron studies, folic acid, TSH within normal limits. - B-12 elevated. - No active bleeding. - Transfused 1 unit on May of 9, due to significant weakness and monitor clinically. - Monitor serial H&H.  Obesity hypoventilation syndrome. - Resume home CPap.  Constipation - Currently resolved, Miralax / Senokot discontinued at patient's request since she is having diarrhea  DVT prophylaxis: heparin Code Status: Full code Family Communication: no family bedside Disposition Plan: SNF when ready  Consultants:   Nephrology   Vascular surgery  Cardiology   Procedures:   2D echo  Antimicrobials:  None    Subjective: Seen in HD. Complains of diarrhea and wants her motility agents discontinued  Objective:  Filed Vitals:   10/17/15 1030 10/17/15 1100 10/17/15 1113 10/17/15 1140    BP: 122/67 129/69 135/74 134/77  Pulse: 88 92 93 97  Temp:    98.6 F (37 C)  TempSrc:    Oral  Resp: 15 15 16 17   Height:      Weight:      SpO2:    100%    Intake/Output Summary (Last 24 hours) at 10/17/15 1357 Last data filed at 10/17/15 1218  Gross per 24 hour  Intake    340 ml  Output      1 ml  Net    339 ml   Filed Weights   10/16/15 2100 10/17/15 0700  Weight: 76.25 kg (168 lb 1.6 oz) 171 kg (376 lb 15.8 oz)    Examination: Constitutional: NAD Filed Vitals:   10/17/15 1030 10/17/15 1100 10/17/15 1113 10/17/15 1140  BP: 122/67 129/69 135/74 134/77  Pulse: 88 92 93 97  Temp:    98.6 F (37 C)  TempSrc:    Oral  Resp: 15 15 16 17   Height:      Weight:      SpO2:    100%   Eyes: PERRL, lids and conjunctivae normal Respiratory: clear to auscultation bilaterally, no wheezing, no crackles.  Cardiovascular: Regular rate and rhythm, no murmurs / rubs / gallops. 1+ LE edema. 2+ pedal pulses.  Abdomen: no tenderness. Bowel sounds positive.  Musculoskeletal: no clubbing / cyanosis.  Neurologic: non focal    Data Reviewed: I have personally reviewed following labs and imaging studies  CBC:  Recent Labs Lab 10/12/15 0830 10/13/15 0653 10/14/15 0652 10/15/15 0835 10/16/15 0632 10/17/15 0511  WBC 12.0* 10.3 9.6 11.6* 14.0* 12.4*  NEUTROABS 9.5*  --   --   --   --  9.4*  HGB 10.2* 8.9* 9.4* 9.3* 9.7* 9.3*  HCT 31.7* 28.6* 31.9* 30.3* 31.6* 30.0*  MCV 88.1 88.3 90.9 88.9 89.0 87.7  PLT 240 217 272 252 261 [id number]   Basic Metabolic Panel:  Recent Labs Lab 10/12/15 0627 10/13/15 0653 10/14/15 0652 10/15/15 0838 10/16/15 0632 10/17/15 0511  NA 140 141 141 139 138 137  137  K 4.0 3.6 4.5 3.9 4.3 4.5  4.4  CL 99* 97* 98* 97* 95* 96*  96*  CO2 25 30 30  32 29 27  27   GLUCOSE 101* 109* 97 128* 88 74  73  BUN 33* 16 25* 33* 24* 36*  37*  CREATININE 7.55* 4.87* 6.53* 8.29* 6.04* 7.42*  7.33*  CALCIUM 8.2* 8.4* 8.7* 8.6* 8.9 8.6*  8.6*  MG 2.1  --    --   --   --   --   PHOS 5.6* 3.7 4.4 4.6 3.8 4.7*   GFR: Estimated Creatinine Clearance: 13.7 mL/min (by C-G formula based on Cr of 7.33). Liver Function Tests:  Recent Labs Lab 10/12/15 0627 10/13/15 0653 10/14/15 0652 10/15/15 0838 10/16/15 0632 10/17/15 0511  AST 50*  --   --   --   --  23  ALT 41  --   --   --   --  28  ALKPHOS 129*  --   --   --   --  117  BILITOT 0.4  --   --   --   --  0.4  PROT 5.7*  --   --   --   --  6.4*  ALBUMIN 1.6* 1.6* 1.9* 1.9* 1.9* 1.8*  1.8*   No results  for input(s): LIPASE, AMYLASE in the last 168 hours. No results for input(s): AMMONIA in the last 168 hours. Coagulation Profile: No results for input(s): INR, PROTIME in the last 168 hours. Cardiac Enzymes:  Recent Labs Lab 10/11/15 1557  CKTOTAL 125   BNP (last 3 results) No results for input(s): PROBNP in the last 8760 hours. HbA1C: No results for input(s): HGBA1C in the last 72 hours. CBG:  Recent Labs Lab 10/16/15 0804 10/16/15 1118 10/16/15 1604 10/16/15 2136 10/17/15 1139  GLUCAP 78 73 93 80 76   Lipid Profile: No results for input(s): CHOL, HDL, LDLCALC, TRIG, CHOLHDL, LDLDIRECT in the last 72 hours. Thyroid Function Tests: No results for input(s): TSH, T4TOTAL, FREET4, T3FREE, THYROIDAB in the last 72 hours. Anemia Panel: No results for input(s): VITAMINB12, FOLATE, FERRITIN, TIBC, IRON, RETICCTPCT in the last 72 hours. Urine analysis: No results found for: COLORURINE, APPEARANCEUR, LABSPEC, PHURINE, GLUCOSEU, HGBUR, BILIRUBINUR, KETONESUR, PROTEINUR, UROBILINOGEN, NITRITE, LEUKOCYTESUR Sepsis Labs: Invalid input(s): PROCALCITONIN, LACTICIDVEN  Recent Results (from the past 240 hour(s))  Surgical pcr screen     Status: None   Collection Time: 10/09/15  6:17 AM  Result Value Ref Range Status   MRSA, PCR NEGATIVE NEGATIVE Final   Staphylococcus aureus NEGATIVE NEGATIVE Final    Comment:        The Xpert SA Assay (FDA approved for NASAL specimens in  patients over 64 years of age), is one component of a comprehensive surveillance program.  Test performance has been validated by [facility] for patients greater than or equal to 49 year old. It is not intended to diagnose infection nor to guide or monitor treatment.   Culture, blood (routine x 2)     Status: None (Preliminary result)   Collection Time: 10/12/15  5:25 PM  Result Value Ref Range Status   Specimen Description BLOOD HEMODIALYSIS CATHETER  Final   Special Requests BOTTLES DRAWN AEROBIC AND ANAEROBIC 10CC  Final   Culture NO GROWTH 4 DAYS  Final   Report Status PENDING  Incomplete  Culture, blood (routine x 2)     Status: None (Preliminary result)   Collection Time: 10/12/15  5:25 PM  Result Value Ref Range Status   Specimen Description BLOOD HEMODIALYSIS CATHETER  Final   Special Requests BOTTLES DRAWN AEROBIC AND ANAEROBIC 10CC  Final   Culture NO GROWTH 4 DAYS  Final   Report Status PENDING  Incomplete      Radiology Studies: No results found.   Scheduled Meds: . amLODipine  10 mg Oral QHS  . aspirin EC  81 mg Oral Daily  . atorvastatin  40 mg Oral Daily  . darbepoetin (ARANESP) injection - DIALYSIS  100 mcg Intravenous Q Wed-HD  . doxercalciferol  1 mcg Intravenous Q M,W,F-HD  . febuxostat  40 mg Oral Daily  . ferric gluconate (FERRLECIT/NULECIT) IV  125 mg Intravenous Q M,W,F-HD  . heparin subcutaneous  5,000 Units Subcutaneous Q8H  . metoprolol succinate  50 mg Oral BID  . multivitamin with minerals  1 tablet Oral Daily  . neomycin-bacitracin-polymyxin   Topical Daily  . sevelamer carbonate  2,400 mg Oral TID WC  . sodium chloride flush  3 mL Intravenous Q12H   Continuous Infusions:    Marzetta Board, MD, PhD [facility] Pager [phone]  If 7PM-7AM, please contact night-coverage [email or web address] Password [email or web address] 10/17/2015, 1:57 PM

## 2015-10-18 ENCOUNTER — Telehealth: Payer: Self-pay | Admitting: *Deleted

## 2015-10-18 LAB — RENAL FUNCTION PANEL
ALBUMIN: 1.9 g/dL — AB (ref 3.5–5.0)
ANION GAP: 13 (ref 5–15)
BUN: 21 mg/dL — AB (ref 6–20)
CALCIUM: 8.6 mg/dL — AB (ref 8.9–10.3)
CO2: 28 mmol/L (ref 22–32)
Chloride: 98 mmol/L — ABNORMAL LOW (ref 101–111)
Creatinine, Ser: 5.32 mg/dL — ABNORMAL HIGH (ref 0.44–1.00)
GFR calc Af Amer: 9 mL/min — ABNORMAL LOW (ref >60)
GFR, EST NON AFRICAN AMERICAN: 8 mL/min — AB (ref >60)
GLUCOSE: 92 mg/dL (ref 65–99)
PHOSPHORUS: 3.6 mg/dL (ref 2.5–4.6)
POTASSIUM: 4.1 mmol/L (ref 3.5–5.1)
SODIUM: 139 mmol/L (ref 135–145)

## 2015-10-18 LAB — GLUCOSE, CAPILLARY
GLUCOSE-CAPILLARY: 89 mg/dL (ref 65–99)
GLUCOSE-CAPILLARY: 102 mg/dL — AB (ref 65–99)
Glucose-Capillary: 88 mg/dL (ref 65–99)
Glucose-Capillary: 126 mg/dL — ABNORMAL HIGH (ref 65–99)

## 2015-10-18 NOTE — Progress Notes (Signed)
PROGRESS NOTE  [name] [id number] DOB: [birth year] DOA: 10/04/2015 PCP: Dwan Bolt, MD   LOS: 13 days   Brief Narrative: Patient was admitted on 10/04/2015, with complaint of shortness of breath progressively worsening without any chest pain or cough or fever or chills, was found to have acute on chronic diastolic dysfunction with elevated troponin and elevated creatinine. Patient also had hyperkalemia with metabolic acidosis. Patient was treated conservatively with Lasix in the ER but later on transferred to [facility] on admission. Initial AV fistula hemodialysis was not successful and temporary hemodialysis catheter was placed. As per vascular surgery the AV fistula will be superficialized. Currently further plan is continue hemodialysis and arrange for outpatient discharge HD.  Assessment & Plan: Principal Problem:   Acute pulmonary edema (HCC) Active Problems:   Morbid obesity ([place])   Renal failure (ARF), acute on chronic (HCC)   Anemia in chronic kidney disease   Type 2 diabetes mellitus with renal complication (HCC)   Hypertension   Elevated troponin   Hypoglycemia   Occult blood positive stool   Chronic anemia   Acute pulmonary edema (HCC) Suspected acute on chronic diastolic dysfunction. Acute on chronic kidney disease stage IV Acute hypoxic respiratory failure - Oxygenation is significantly improving with fluid removal, now fluid management per HD - Left arm fistula placed in 07/25/2015. Unable to use at present due to being too deep - Tunneled HD catheter on May 9. - Hemodialysis started on May 5 with temporary catheter and will need lifelong hemodialysis an outpatient HD clip - AV fistula will require future procedures later and patient will need outpatient follow-up with vascular surgery. - Patient will need to be able to tolerate hemodialysis in chair in an outpatient setting which she is unable to perform at present and therefore discharge was  held up  Generalized fatigue and weakness with physical deconditioning. - CPK normal, not on any psychotropic medication. Phosphorus levels adequately elevated. - TSH & cortisol level unremarkable. - Hemodynamically stable. Blood cultures no growth to date. - H&H improved after transfusion, improvement in weakness after transfusion. - At present the etiology of her generalized fatigue and weakness is most likely physical deconditioning. - Patient will need significant physical therapy. - As per CIR not a candidate for CIR and will need SNF. - asked [facility] to evaluate as well  Elevated troponin. - Troponin was 11 on May 6 - Echocardiogram does not show any acute wall motion abnormality EKG does not show any acute ischemic changes. - Currently on aspirin and statin and beta blocker. - Cardiology was consulted and appreciate input, medical management for now  Acute encephalopathy. - Currently resolving. - Avoid sedating medications.  Essential hypertension. - Blood pressure getting stable after hemodialysis initiation. - Continue amlodipine and metoprolol.  Anemia of chronic kidney disease. - Iron studies, folic acid, TSH within normal limits. - B-12 elevated. - No active bleeding. - Transfused 1 unit on May of 9, due to significant weakness and monitor clinically. - Monitor serial H&H.  Obesity hypoventilation syndrome. - Resume home CPap.  Constipation >> diarrhea - Currently resolved, Miralax / Senokot discontinued at patient's request since she is having diarrhea - diarrhea slowing down, monitor  DVT prophylaxis: heparin Code Status: Full code Family Communication: no family bedside Disposition Plan: SNF when ready  Consultants:   Nephrology   Vascular surgery  Cardiology   Procedures:   2D echo  Antimicrobials:  None    Subjective: - still with some diarrhea today. Per  RN its starting to form  - no chest pain / palpitations   Objective: Filed  Vitals:   10/17/15 1715 10/17/15 2246 10/18/15 0648 10/18/15 0827  BP: 129/79 138/81 143/74 125/69  Pulse: 96 95 94 93  Temp: 98.2 F (36.8 C) 98.8 F (37.1 C) 98 F (36.7 C) 99.1 F (37.3 C)  TempSrc: Oral Oral Oral Oral  Resp: 18 18 16 17   Height:      Weight:      SpO2: 100% 96% 95% 97%    Intake/Output Summary (Last 24 hours) at 10/18/15 1257 Last data filed at 10/18/15 1000  Gross per 24 hour  Intake    720 ml  Output      2 ml  Net    718 ml   Filed Weights   10/16/15 2100 10/17/15 0700 10/17/15 1113  Weight: 76.25 kg (168 lb 1.6 oz) 171 kg (376 lb 15.8 oz) 167 kg (368 lb 2.7 oz)    Examination: Constitutional: NAD Filed Vitals:   10/17/15 1715 10/17/15 2246 10/18/15 0648 10/18/15 0827  BP: 129/79 138/81 143/74 125/69  Pulse: 96 95 94 93  Temp: 98.2 F (36.8 C) 98.8 F (37.1 C) 98 F (36.7 C) 99.1 F (37.3 C)  TempSrc: Oral Oral Oral Oral  Resp: 18 18 16 17   Height:      Weight:      SpO2: 100% 96% 95% 97%   Eyes: PERRL, lids and conjunctivae normal Respiratory: clear to auscultation bilaterally, no wheezing, no crackles.  Cardiovascular: Regular rate and rhythm, no murmurs / rubs / gallops. 1+ LE edema. 2+ pedal pulses.  Abdomen: no tenderness. Bowel sounds positive.  Musculoskeletal: no clubbing / cyanosis.  Neurologic: non focal    Data Reviewed: I have personally reviewed following labs and imaging studies  CBC:  Recent Labs Lab 10/12/15 0830 10/13/15 0653 10/14/15 0652 10/15/15 0835 10/16/15 0632 10/17/15 0511  WBC 12.0* 10.3 9.6 11.6* 14.0* 12.4*  NEUTROABS 9.5*  --   --   --   --  9.4*  HGB 10.2* 8.9* 9.4* 9.3* 9.7* 9.3*  HCT 31.7* 28.6* 31.9* 30.3* 31.6* 30.0*  MCV 88.1 88.3 90.9 88.9 89.0 87.7  PLT 240 217 272 252 261 [id number]   Basic Metabolic Panel:  Recent Labs Lab 10/12/15 0627  10/14/15 0652 10/15/15 0838 10/16/15 0632 10/17/15 0511 10/18/15 0618  NA 140  < > 141 139 138 137  137 139  K 4.0  < > 4.5 3.9 4.3 4.5  4.4  4.1  CL 99*  < > 98* 97* 95* 96*  96* 98*  CO2 25  < > 30 32 29 27  27 28   GLUCOSE 101*  < > 97 128* 88 74  73 92  BUN 33*  < > 25* 33* 24* 36*  37* 21*  CREATININE 7.55*  < > 6.53* 8.29* 6.04* 7.42*  7.33* 5.32*  CALCIUM 8.2*  < > 8.7* 8.6* 8.9 8.6*  8.6* 8.6*  MG 2.1  --   --   --   --   --   --   PHOS 5.6*  < > 4.4 4.6 3.8 4.7* 3.6  < > = values in this interval not displayed. GFR: Estimated Creatinine Clearance: 18.6 mL/min (by C-G formula based on Cr of 5.32). Liver Function Tests:  Recent Labs Lab 10/12/15 [id number]  10/14/15 [id number] 10/15/15 [id number] 10/16/15 [id number] 10/17/15 0511 10/18/15 0618  AST 50*  --   --   --   --  23  --   ALT 41  --   --   --   --  28  --   ALKPHOS 129*  --   --   --   --  117  --   BILITOT 0.4  --   --   --   --  0.4  --   PROT 5.7*  --   --   --   --  6.4*  --   ALBUMIN 1.6*  < > 1.9* 1.9* 1.9* 1.8*  1.8* 1.9*  < > = values in this interval not displayed. No results for input(s): LIPASE, AMYLASE in the last 168 hours. No results for input(s): AMMONIA in the last 168 hours. Coagulation Profile: No results for input(s): INR, PROTIME in the last 168 hours. Cardiac Enzymes:  Recent Labs Lab 10/11/15 1557  CKTOTAL 125   BNP (last 3 results) No results for input(s): PROBNP in the last 8760 hours. HbA1C: No results for input(s): HGBA1C in the last 72 hours. CBG:  Recent Labs Lab 10/17/15 1139 10/17/15 1714 10/17/15 2249 10/18/15 0823 10/18/15 1153  GLUCAP 76 101* 107* 89 88   Lipid Profile: No results for input(s): CHOL, HDL, LDLCALC, TRIG, CHOLHDL, LDLDIRECT in the last 72 hours. Thyroid Function Tests: No results for input(s): TSH, T4TOTAL, FREET4, T3FREE, THYROIDAB in the last 72 hours. Anemia Panel: No results for input(s): VITAMINB12, FOLATE, FERRITIN, TIBC, IRON, RETICCTPCT in the last 72 hours. Urine analysis: No results found for: COLORURINE, APPEARANCEUR, LABSPEC, PHURINE, GLUCOSEU, HGBUR, BILIRUBINUR, KETONESUR, PROTEINUR,  UROBILINOGEN, NITRITE, LEUKOCYTESUR Sepsis Labs: Invalid input(s): PROCALCITONIN, LACTICIDVEN  Recent Results (from the past 240 hour(s))  Surgical pcr screen     Status: None   Collection Time: 10/09/15  6:17 AM  Result Value Ref Range Status   MRSA, PCR NEGATIVE NEGATIVE Final   Staphylococcus aureus NEGATIVE NEGATIVE Final    Comment:        The Xpert SA Assay (FDA approved for NASAL specimens in patients over 49 years of age), is one component of a comprehensive surveillance program.  Test performance has been validated by [facility] for patients greater than or equal to 12 year old. It is not intended to diagnose infection nor to guide or monitor treatment.   Culture, blood (routine x 2)     Status: None   Collection Time: 10/12/15  5:25 PM  Result Value Ref Range Status   Specimen Description BLOOD HEMODIALYSIS CATHETER  Final   Special Requests BOTTLES DRAWN AEROBIC AND ANAEROBIC 10CC  Final   Culture NO GROWTH 5 DAYS  Final   Report Status 10/17/2015 FINAL  Final  Culture, blood (routine x 2)     Status: None   Collection Time: 10/12/15  5:25 PM  Result Value Ref Range Status   Specimen Description BLOOD HEMODIALYSIS CATHETER  Final   Special Requests BOTTLES DRAWN AEROBIC AND ANAEROBIC 10CC  Final   Culture NO GROWTH 5 DAYS  Final   Report Status 10/17/2015 FINAL  Final      Radiology Studies: No results found.   Scheduled Meds: . amLODipine  10 mg Oral QHS  . aspirin EC  81 mg Oral Daily  . atorvastatin  40 mg Oral Daily  . darbepoetin (ARANESP) injection - DIALYSIS  100 mcg Intravenous Q Wed-HD  . doxercalciferol  1 mcg Intravenous Q M,W,F-HD  . febuxostat  40 mg Oral Daily  . ferric gluconate (FERRLECIT/NULECIT) IV  125 mg Intravenous Q M,W,F-HD  . heparin  subcutaneous  5,000 Units Subcutaneous Q8H  . metoprolol succinate  50 mg Oral BID  . multivitamin with minerals  1 tablet Oral Daily  . neomycin-bacitracin-polymyxin   Topical Daily  . sevelamer  carbonate  2,400 mg Oral TID WC  . sodium chloride flush  3 mL Intravenous Q12H   Continuous Infusions:    Marzetta Board, MD, PhD [facility] Pager [phone]  If 7PM-7AM, please contact night-coverage [email or web address] Password [facility] 10/18/2015, 12:57 PM

## 2015-10-18 NOTE — Telephone Encounter (Signed)
Per Dr. Bridgett Larsson, if superficialization is needed for this patient's access (Left arm AVF placed 07-05-15), the nephrologist can call our office to schedule with any of our [facility] doctors. Patient is currently using Right IJ that was placed on 10-09-15.  Patient is still in [facility] inpatient as of today.

## 2015-10-18 NOTE — Progress Notes (Signed)
Admit: 10/04/2015 LOS: 106  55F new ESRD, morbid obesity limiting access to outpt HD, DM2, Deconditioning/impaired mobility  Subjective:  HD yesterday with tight heparin, 4L UF, full Tx No new events   05/17 0701 - 05/18 0700 In: 840 [P.O.:840] Out: 4003 [Stool:3]  Filed Weights   10/16/15 2100 10/17/15 0700 10/17/15 1113  Weight: 76.25 kg (168 lb 1.6 oz) 171 kg (376 lb 15.8 oz) 167 kg (368 lb 2.7 oz)    Scheduled Meds: . amLODipine  10 mg Oral QHS  . aspirin EC  81 mg Oral Daily  . atorvastatin  40 mg Oral Daily  . darbepoetin (ARANESP) injection - DIALYSIS  100 mcg Intravenous Q Wed-HD  . doxercalciferol  1 mcg Intravenous Q M,W,F-HD  . febuxostat  40 mg Oral Daily  . ferric gluconate (FERRLECIT/NULECIT) IV  125 mg Intravenous Q M,W,F-HD  . heparin subcutaneous  5,000 Units Subcutaneous Q8H  . metoprolol succinate  50 mg Oral BID  . multivitamin with minerals  1 tablet Oral Daily  . neomycin-bacitracin-polymyxin   Topical Daily  . sevelamer carbonate  2,400 mg Oral TID WC  . sodium chloride flush  3 mL Intravenous Q12H   Continuous Infusions:  PRN Meds:.acetaminophen **OR** acetaminophen, hydrALAZINE, hydrocortisone cream, lactulose, lip balm, nitroGLYCERIN, ondansetron **OR** ondansetron (ZOFRAN) IV  Current Labs: reviewed    Physical Exam:  Blood pressure 125/69, pulse 93, temperature 99.1 F (37.3 C), temperature source Oral, resp. rate 17, height 5\' 6"  (1.676 m), weight 167 kg (368 lb 2.7 oz), SpO2 97 %. Morbidly obese, NAD LUE AVF B/T+, hematoma stable RIJ TDC RRR no rub Coarse bs ant ABD obese, soft  A 1. New ESRD 2. Morbid Obesity / Impaired Mobility / Deconditioning 3. Anemia: on ESA qWed, rec Fe 4. BMD: On VDRA and Renvela 5. Recent AVF infiltration, [facility] following 6. HLD 7. DM2 8. HTN 9. Gout  P 1. Cont HD on MWF schedule: 3K, Tight heparin 4L 2. Challenge EDW consecutively 3. Cont PT/OT  Pearson Grippe MD 10/18/2015, 11:58 AM   Recent  Labs Lab 10/16/15 [phone] 10/17/15 0511 10/18/15 0618  NA 138 137  137 139  K 4.3 4.5  4.4 4.1  CL 95* 96*  96* 98*  CO2 29 27  27 28   GLUCOSE 88 74  73 92  BUN 24* 36*  37* 21*  CREATININE 6.04* 7.42*  7.33* 5.32*  CALCIUM 8.9 8.6*  8.6* 8.6*  PHOS 3.8 4.7* 3.6    Recent Labs Lab 10/12/15 0830  10/15/15 0835 10/16/15 0632 10/17/15 0511  WBC 12.0*  < > 11.6* 14.0* 12.4*  NEUTROABS 9.5*  --   --   --  9.4*  HGB 10.2*  < > 9.3* 9.7* 9.3*  HCT 31.7*  < > 30.3* 31.6* 30.0*  MCV 88.1  < > 88.9 89.0 87.7  PLT 240  < > 252 261 269  < > = values in this interval not displayed.

## 2015-10-18 NOTE — Progress Notes (Signed)
Occupational Therapy Treatment Patient Details Name: [name] MRN: [id number] DOB: [birth year] Today's Date: 10/18/2015    History of present illness 60 year old female presenting with 10 days of progressive dyspnea. PMH is significant for CKD, HTN, T2DM, and seizure disorder   OT comments  Pt very motivated:  Encouraged her to increase her participation with ADLs when working with nurse tech to continue to increase strength and endurance.  She reports that she is doing theraband exercises daily.  Follow Up Recommendations  CIR;Supervision/Assistance - 24 hour    Equipment Recommendations  3 in 1 bedside comode;Tub/shower bench    Recommendations for Other Services      Precautions / Restrictions Precautions Precautions: Fall Restrictions Weight Bearing Restrictions: No       Mobility Bed Mobility by PT Overal bed mobility: Needs Assistance Bed Mobility: Supine to Sit Rolling: Mod assist         General bed mobility comments: performed with PT  Transfers by PT Overall transfer level: Needs assistance Equipment used: Rolling walker (2 wheeled) Transfers: Sit to/from Stand Sit to Stand: Mod assist;+2 physical assistance Stand pivot transfers: Mod assist;+2 physical assistance       General transfer comment: performed with PT    Balance     Sitting balance-Leahy Scale: Fair       Standing balance-Leahy Scale: Poor Standing balance comment: performed with PT                   ADL                                         General ADL Comments: pt had gotten up into chair with PT.  She had already bathed, but reports that she is only washing her face. Worked on applying lotion to UB and LB from sitting:  set up UB and mod A for LB (seated only)   Educated on AE for LB--used from seated position only.  Pt was able to perform ADLs prior to this admission.  Pt has theraband tied to her bed and she reports that she is performing UE  exercises daily (FF, horizontal abduction)      Vision                     Perception     Praxis      Cognition   Behavior During Therapy: WFL for tasks assessed/performed Overall Cognitive Status: Within Functional Limits for tasks assessed                       Extremity/Trunk Assessment               Exercises     Shoulder Instructions       General Comments      Pertinent Vitals/ Pain       Pain Assessment: No/denies pain  Home Living                                          Prior Functioning/Environment              Frequency Min 2X/week     Progress Toward Goals  OT Goals(current goals can now be found in the care plan section)  Progress towards  OT goals: Progressing toward goals  Acute Rehab OT Goals Patient Stated Goal: be able to return home  Plan      Co-evaluation                 End of Session     Activity Tolerance Patient tolerated treatment well   Patient Left in chair;with call bell/phone within reach   Nurse Communication          Time: 1425-1444 OT Time Calculation (min): 19 min  Charges: OT General Charges $OT Visit: 1 Procedure OT Treatments $Self Care/Home Management : 8-22 mins  Kamdyn Covel 10/18/2015, 3:28 PM  Lesle Chris, OTR/L [phone] 10/18/2015

## 2015-10-18 NOTE — Progress Notes (Signed)
Physical Therapy Treatment Patient Details Name: [name] MRN: [id number] DOB: 1955/12/01 Today's Date: 10/18/2015    History of Present Illness 60 year old female presenting with 10 days of progressive dyspnea. PMH is significant for CKD, HTN, T2DM, and seizure disorder    PT Comments    Pt able to take a few steps to bari-recliner today.  She fatigues quickly and recommend SNF.  Pt is motivated to get better and works well with PT.  Follow Up Recommendations  SNF;Supervision/Assistance - 24 hour     Equipment Recommendations  Rolling walker with 5" wheels;3in1 (PT) (bariatric)    Recommendations for Other Services       Precautions / Restrictions Precautions Precautions: Fall Restrictions Weight Bearing Restrictions: No    Mobility  Bed Mobility Overal bed mobility: Needs Assistance Bed Mobility: Supine to Sit Rolling: Mod assist         General bed mobility comments: cueing for technique and how to scoot hips forward.  Transfers Overall transfer level: Needs assistance Equipment used: Rolling walker (2 wheeled) Transfers: Sit to/from Stand Sit to Stand: Mod assist;+2 physical assistance Stand pivot transfers: Mod assist;+2 physical assistance          Ambulation/Gait Ambulation/Gait assistance: Min assist;+2 physical assistance;+2 safety/equipment Ambulation Distance (Feet): 3 Feet Assistive device: Rolling walker (2 wheeled) Gait Pattern/deviations: Shuffle;Trunk flexed     General Gait Details: Fatigued quickly and flexed posture   Stairs            Wheelchair Mobility    Modified Rankin (Stroke Patients Only)       Balance     Sitting balance-Leahy Scale: Fair       Standing balance-Leahy Scale: Poor Standing balance comment: requires UE support                    Cognition Arousal/Alertness: Awake/alert Behavior During Therapy: WFL for tasks assessed/performed Overall Cognitive Status: Within Functional  Limits for tasks assessed                      Exercises      General Comments General comments (skin integrity, edema, etc.): Pt had difficulty getting scooted back in barichair, but eventually was able to get comfortable with buttocks all the way back.  Fatigued with trying to scoot hips backwards      Pertinent Vitals/Pain Pain Assessment: No/denies pain    Home Living                      Prior Function            PT Goals (current goals can now be found in the care plan section) Acute Rehab PT Goals Patient Stated Goal: be able to return home PT Goal Formulation: With patient Time For Goal Achievement: 10/23/15 Potential to Achieve Goals: Good Progress towards PT goals: Progressing toward goals    Frequency  Min 2X/week    PT Plan Current plan remains appropriate    Co-evaluation             End of Session Equipment Utilized During Treatment: Gait belt Activity Tolerance: Patient tolerated treatment well Patient left: in chair;with call bell/phone within reach     Time: [id number] PT Time Calculation (min) (ACUTE ONLY): 33 min  Charges:  $Therapeutic Activity: 23-37 mins                    G Codes:  [name] 10/18/2015, 2:44 PM

## 2015-10-19 LAB — CBC
HCT: 31 % — ABNORMAL LOW (ref 36.0–46.0)
HEMOGLOBIN: 9.3 g/dL — AB (ref 12.0–15.0)
MCH: 27 pg (ref 26.0–34.0)
MCHC: 30 g/dL (ref 30.0–36.0)
MCV: 89.9 fL (ref 78.0–100.0)
PLATELETS: 314 10*3/uL (ref 150–400)
RBC: 3.45 MIL/uL — AB (ref 3.87–5.11)
RDW: 15.2 % (ref 11.5–15.5)
WBC: 8.7 10*3/uL (ref 4.0–10.5)

## 2015-10-19 LAB — RENAL FUNCTION PANEL
ANION GAP: 13 (ref 5–15)
Albumin: 2 g/dL — ABNORMAL LOW (ref 3.5–5.0)
BUN: 32 mg/dL — ABNORMAL HIGH (ref 6–20)
CALCIUM: 9 mg/dL (ref 8.9–10.3)
CO2: 29 mmol/L (ref 22–32)
CREATININE: 7.34 mg/dL — AB (ref 0.44–1.00)
Chloride: 98 mmol/L — ABNORMAL LOW (ref 101–111)
GFR, EST AFRICAN AMERICAN: 6 mL/min — AB (ref >60)
GFR, EST NON AFRICAN AMERICAN: 5 mL/min — AB (ref >60)
Glucose, Bld: 75 mg/dL (ref 65–99)
PHOSPHORUS: 3.8 mg/dL (ref 2.5–4.6)
Potassium: 4 mmol/L (ref 3.5–5.1)
SODIUM: 140 mmol/L (ref 135–145)

## 2015-10-19 LAB — GLUCOSE, CAPILLARY
GLUCOSE-CAPILLARY: 76 mg/dL (ref 65–99)
GLUCOSE-CAPILLARY: 105 mg/dL — AB (ref 65–99)
GLUCOSE-CAPILLARY: 101 mg/dL — AB (ref 65–99)

## 2015-10-19 MED ORDER — DOXERCALCIFEROL 4 MCG/2ML IV SOLN
INTRAVENOUS | Status: AC
  Filled 2015-10-19: qty 2

## 2015-10-19 NOTE — Progress Notes (Signed)
PROGRESS NOTE  [name] [id number] DOB: [birth year] DOA: 10/04/2015 PCP: Dwan Bolt, MD   LOS: 14 days   Brief Narrative: Patient was admitted on 10/04/2015, with complaint of shortness of breath progressively worsening without any chest pain or cough or fever or chills, was found to have acute on chronic diastolic dysfunction with elevated troponin and elevated creatinine. Patient also had hyperkalemia with metabolic acidosis. Patient was treated conservatively with Lasix in the ER but later on transferred to [facility] on admission. Initial AV fistula hemodialysis was not successful and temporary hemodialysis catheter was placed. As per vascular surgery the AV fistula will be superficialized. Currently further plan is continue hemodialysis and arrange for outpatient discharge HD.  Assessment & Plan: Principal Problem:   Acute pulmonary edema (HCC) Active Problems:   Morbid obesity ([place])   Renal failure (ARF), acute on chronic (HCC)   Anemia in chronic kidney disease   Type 2 diabetes mellitus with renal complication (HCC)   Hypertension   Elevated troponin   Hypoglycemia   Occult blood positive stool   Chronic anemia  Acute hypoxic respiratory failure due to Acute pulmonary edema (HCC) and Suspected acute on chronic diastolic dysfunction. - Oxygenation is significantly improving with fluid removal, now fluid management per HD - Left arm fistula placed in 07/25/2015. Unable to use at present due to being too deep - Tunneled HD catheter on May 9. - Hemodialysis started on May 5 with temporary catheter and will need lifelong hemodialysis an outpatient HD clip - AV fistula will require future procedures later and patient will need outpatient follow-up with vascular surgery. - Patient will need to be able to tolerate hemodialysis in chair in an outpatient setting which she is unable to perform at present and therefore discharge was held up.   Generalized fatigue  and weakness with physical deconditioning. - Likely due to morbid obesity and prolonged hospital stay - TSH & cortisol level unremarkable. - Hemodynamically stable. Blood cultures no growth to date. - H&H improved after transfusion, improvement in weakness after transfusion. - Patient will need significant physical therapy. - As per CIR not a candidate for CIR and will need SNF.  Elevated troponin. - Troponin was 11 on May 6 - Echocardiogram does not show any acute wall motion abnormality EKG does not show any acute ischemic changes. - Currently on aspirin and statin and beta blocker. - Cardiology was consulted and appreciate input, medical management for now  Acute encephalopathy. - Currently resolving. - Avoid sedating medications.  Essential hypertension. - Blood pressure getting stable after hemodialysis initiation. - Continue amlodipine and metoprolol.  Anemia of chronic kidney disease. - Iron studies, folic acid, TSH within normal limits. - B-12 elevated. - No active bleeding. - Transfused 1 unit on May of 9, due to significant weakness and monitor clinically. - Monitor serial H&H.  Obesity hypoventilation syndrome. - Resume home CPap.  Constipation >> diarrhea - Currently resolved, Miralax / Senokot discontinued at patient's request since she is having diarrhea - diarrhea resolved  DVT prophylaxis: heparin Code Status: Full code Family Communication: no family bedside Disposition Plan: SNF when ready  Consultants:   Nephrology   Vascular surgery  Cardiology   Procedures:   2D echo  Antimicrobials:  None    Subjective: - no further diarrhea, endorses back pain due to being in bed all the time  Objective: Filed Vitals:   10/19/15 1035 10/19/15 1105 10/19/15 1204 10/19/15 1246  BP: 115/63 106/58 104/65 114/67  Pulse: 87  93 91 90  Temp:   98.5 F (36.9 C) 98.2 F (36.8 C)  TempSrc:   Oral Oral  Resp:   16 18  Height:      Weight:   162 kg (357  lb 2.3 oz)   SpO2:   100% 100%    Intake/Output Summary (Last 24 hours) at 10/19/15 1453 Last data filed at 10/19/15 1259  Gross per 24 hour  Intake    123 ml  Output   3950 ml  Net  -3827 ml   Filed Weights   10/17/15 1113 10/19/15 0755 10/19/15 1204  Weight: 167 kg (368 lb 2.7 oz) 166 kg (365 lb 15.4 oz) 162 kg (357 lb 2.3 oz)    Examination: Constitutional: NAD Filed Vitals:   10/19/15 1035 10/19/15 1105 10/19/15 1204 10/19/15 1246  BP: 115/63 106/58 104/65 114/67  Pulse: 87 93 91 90  Temp:   98.5 F (36.9 C) 98.2 F (36.8 C)  TempSrc:   Oral Oral  Resp:   16 18  Height:      Weight:   162 kg (357 lb 2.3 oz)   SpO2:   100% 100%   Respiratory: clear to auscultation bilaterally, no wheezing, no crackles.  Cardiovascular: Regular rate and rhythm, no murmurs / rubs / gallops. 1+ LE edema. 2+ pedal pulses.  Abdomen: no tenderness. Bowel sounds positive.    Data Reviewed: I have personally reviewed following labs and imaging studies  CBC:  Recent Labs Lab 10/14/15 0652 10/15/15 0835 10/16/15 0632 10/17/15 0511 10/19/15 0800  WBC 9.6 11.6* 14.0* 12.4* 8.7  NEUTROABS  --   --   --  9.4*  --   HGB 9.4* 9.3* 9.7* 9.3* 9.3*  HCT 31.9* 30.3* 31.6* 30.0* 31.0*  MCV 90.9 88.9 89.0 87.7 89.9  PLT 272 252 261 269 [id number]   Basic Metabolic Panel:  Recent Labs Lab 10/15/15 0838 10/16/15 0632 10/17/15 0511 10/18/15 0618 10/19/15 0529  NA 139 138 137  137 139 140  K 3.9 4.3 4.5  4.4 4.1 4.0  CL 97* 95* 96*  96* 98* 98*  CO2 32 29 27  27 28 29   GLUCOSE 128* 88 74  73 92 75  BUN 33* 24* 36*  37* 21* 32*  CREATININE 8.29* 6.04* 7.42*  7.33* 5.32* 7.34*  CALCIUM 8.6* 8.9 8.6*  8.6* 8.6* 9.0  PHOS 4.6 3.8 4.7* 3.6 3.8   GFR: Estimated Creatinine Clearance: 13.2 mL/min (by C-G formula based on Cr of 7.34). Liver Function Tests:  Recent Labs Lab 10/15/15 [id number] 10/16/15 [id number] 10/17/15 0511 10/18/15 0618 10/19/15 0529  AST  --   --  23  --   --   ALT  --    --  28  --   --   ALKPHOS  --   --  117  --   --   BILITOT  --   --  0.4  --   --   PROT  --   --  6.4*  --   --   ALBUMIN 1.9* 1.9* 1.8*  1.8* 1.9* 2.0*   No results for input(s): LIPASE, AMYLASE in the last 168 hours. No results for input(s): AMMONIA in the last 168 hours. Coagulation Profile: No results for input(s): INR, PROTIME in the last 168 hours. Cardiac Enzymes: No results for input(s): CKTOTAL, CKMB, CKMBINDEX, TROPONINI in the last 168 hours. BNP (last 3 results) No results for input(s): PROBNP in the last 8760 hours. HbA1C: No results  for input(s): HGBA1C in the last 72 hours. CBG:  Recent Labs Lab 10/18/15 0823 10/18/15 1153 10/18/15 1713 10/18/15 2126 10/19/15 1236  GLUCAP 89 88 126* 102* 76   Lipid Profile: No results for input(s): CHOL, HDL, LDLCALC, TRIG, CHOLHDL, LDLDIRECT in the last 72 hours. Thyroid Function Tests: No results for input(s): TSH, T4TOTAL, FREET4, T3FREE, THYROIDAB in the last 72 hours. Anemia Panel: No results for input(s): VITAMINB12, FOLATE, FERRITIN, TIBC, IRON, RETICCTPCT in the last 72 hours. Urine analysis: No results found for: COLORURINE, APPEARANCEUR, LABSPEC, PHURINE, GLUCOSEU, HGBUR, BILIRUBINUR, KETONESUR, PROTEINUR, UROBILINOGEN, NITRITE, LEUKOCYTESUR Sepsis Labs: Invalid input(s): PROCALCITONIN, LACTICIDVEN  Recent Results (from the past 240 hour(s))  Culture, blood (routine x 2)     Status: None   Collection Time: 10/12/15  5:25 PM  Result Value Ref Range Status   Specimen Description BLOOD HEMODIALYSIS CATHETER  Final   Special Requests BOTTLES DRAWN AEROBIC AND ANAEROBIC 10CC  Final   Culture NO GROWTH 5 DAYS  Final   Report Status 10/17/2015 FINAL  Final  Culture, blood (routine x 2)     Status: None   Collection Time: 10/12/15  5:25 PM  Result Value Ref Range Status   Specimen Description BLOOD HEMODIALYSIS CATHETER  Final   Special Requests BOTTLES DRAWN AEROBIC AND ANAEROBIC 10CC  Final   Culture NO GROWTH 5  DAYS  Final   Report Status 10/17/2015 FINAL  Final      Radiology Studies: No results found.   Scheduled Meds: . amLODipine  10 mg Oral QHS  . aspirin EC  81 mg Oral Daily  . atorvastatin  40 mg Oral Daily  . darbepoetin (ARANESP) injection - DIALYSIS  100 mcg Intravenous Q Wed-HD  . doxercalciferol  1 mcg Intravenous Q M,W,F-HD  . febuxostat  40 mg Oral Daily  . ferric gluconate (FERRLECIT/NULECIT) IV  125 mg Intravenous Q M,W,F-HD  . heparin subcutaneous  5,000 Units Subcutaneous Q8H  . metoprolol succinate  50 mg Oral BID  . multivitamin with minerals  1 tablet Oral Daily  . neomycin-bacitracin-polymyxin   Topical Daily  . sevelamer carbonate  2,400 mg Oral TID WC  . sodium chloride flush  3 mL Intravenous Q12H   Continuous Infusions:    Marzetta Board, MD, PhD [facility] Pager [phone]  If 7PM-7AM, please contact night-coverage [email or web address] Password [email or web address] 10/19/2015, 2:53 PM

## 2015-10-19 NOTE — Progress Notes (Signed)
10/19/2015 2:00 PM  Patient complained of nausea. Went to room to assess patient and she was asleep. Will return to patient room to assess her for nausea when she wakes up.   [employer], RN-BC, [employer] [facility] Phone [phone]

## 2015-10-19 NOTE — NC FL2 (Signed)
MEDICAID FL2 LEVEL OF CARE SCREENING TOOL     IDENTIFICATION  Patient Name: [name] Birthdate: Nov 24, 1955 Sex: female Admission Date (Current Location): 10/04/2015  [facility] and [place] Number:  Herbalist and Address:  [facility] [place], [place] [address], [place], [place] [address]      Provider Number: [id number]  Attending Physician Name and Address:  Caren Griffins, MD  Relative Name and Phone Number:  Cezanne Laughlin - sister.  Phone number [phone] (mobile)    Current Level of Care: Hospital Recommended Level of Care: [place] Prior Approval Number:    Date Approved/Denied:   PASRR Number: [id number]  Discharge Plan: SNF    Current Diagnoses: Patient Active Problem List   Diagnosis Date Noted  . Hypoglycemia 10/07/2015  . Occult blood positive stool 10/07/2015  . Chronic anemia   . Elevated troponin 10/06/2015  . Renal failure 10/05/2015  . Acute pulmonary edema ([place]) 10/05/2015  . Renal failure (ARF), acute on chronic (HCC) 10/05/2015  . Anemia in chronic kidney disease 10/05/2015  . Type 2 diabetes mellitus with renal complication ([place]) [id number]  . Hypertension 10/05/2015  . Hyperkalemia   . Bilateral leg edema 10/03/2015  . DOE (dyspnea on exertion) 10/03/2015  . Morbid obesity ([place]) 10/03/2015  . CKD (chronic kidney disease) stage 4, GFR 15-29 ml/min (HCC) 05/04/2015    Orientation RESPIRATION BLADDER Height & Weight     Self, Time, Place, Situation  Normal Incontinent Weight: (!) 357 lb 2.3 oz (162 kg) Height:  5\' 6"  (167.6 cm)  BEHAVIORAL SYMPTOMS/MOOD NEUROLOGICAL BOWEL NUTRITION STATUS    Convulsions/Seizures (Patient has medical hx of seizures) Incontinent Diet (Please see discharge summary.)  AMBULATORY STATUS COMMUNICATION OF NEEDS Skin   Extensive Assist Verbally Surgical wounds                       Personal Care Assistance Level of Assistance  Bathing, Dressing,  Feeding Bathing Assistance: Limited assistance Feeding assistance: Independent Dressing Assistance: Limited assistance     Functional Limitations Info  Sight, Hearing, Speech Sight Info: Adequate Hearing Info: Adequate Speech Info: Adequate    SPECIAL CARE FACTORS FREQUENCY  PT (By licensed PT), OT (By licensed OT)     PT Frequency: 5 OT Frequency: 5            Contractures      Additional Factors Info  Code Status, Allergies Code Status Info: FULL Allergies Info: Morphine and related           Current Medications (10/19/2015):  This is the current hospital active medication list Current Facility-Administered Medications  Medication Dose Route Frequency Provider Last Rate Last Dose  . acetaminophen (TYLENOL) tablet 650 mg  650 mg Oral Q6H PRN Rise Patience, MD   650 mg at 10/17/15 0020   Or  . acetaminophen (TYLENOL) suppository 650 mg  650 mg Rectal Q6H PRN Rise Patience, MD      . amLODipine (NORVASC) tablet 10 mg  10 mg Oral QHS Rise Patience, MD   10 mg at 10/18/15 2108  . aspirin EC tablet 81 mg  81 mg Oral Daily Rise Patience, MD   81 mg at 10/18/15 1124  . atorvastatin (LIPITOR) tablet 40 mg  40 mg Oral Daily Rise Patience, MD   40 mg at 10/18/15 1123  . Darbepoetin Alfa (ARANESP) injection 100 mcg  100 mcg Intravenous Q Wed-HD Jamal Maes,  MD   100 mcg at 10/17/15 0847  . doxercalciferol (HECTOROL) injection 1 mcg  1 mcg Intravenous Q M,W,F-HD Jamal Maes, MD   1 mcg at 10/19/15 0957  . febuxostat (ULORIC) tablet 40 mg  40 mg Oral Daily Rise Patience, MD   40 mg at 10/18/15 1123  . ferric gluconate (NULECIT) 125 mg in sodium chloride 0.9 % 100 mL IVPB  125 mg Intravenous Q M,W,F-HD Jamal Maes, MD   125 mg at 10/19/15 0958  . heparin injection 5,000 Units  5,000 Units Subcutaneous Q8H Janece Canterbury, MD   5,000 Units at 10/19/15 0532  . hydrALAZINE (APRESOLINE) injection 10 mg  10 mg Intravenous Q4H PRN Rise Patience, MD      . hydrocortisone cream 1 % 1 application  1 application Topical TID PRN Gardiner Barefoot, NP   1 application at [id number]  . lactulose (CHRONULAC) 10 GM/15ML solution 10 g  10 g Oral BID PRN Janece Canterbury, MD      . lip balm (BLISTEX) ointment   Topical PRN Lavina Hamman, MD      . metoprolol succinate (TOPROL-XL) 24 hr tablet 50 mg  50 mg Oral BID Estanislado Emms, MD   50 mg at 10/18/15 2108  . multivitamin with minerals tablet 1 tablet  1 tablet Oral Daily Rise Patience, MD   1 tablet at 10/18/15 1123  . neomycin-bacitracin-polymyxin (NEOSPORIN) ointment   Topical Daily Janece Canterbury, MD      . nitroGLYCERIN (NITROSTAT) SL tablet 0.4 mg  0.4 mg Sublingual Q5 min PRN Janece Canterbury, MD      . ondansetron [facility]) tablet 4 mg  4 mg Oral Q6H PRN Rise Patience, MD   4 mg at 10/11/15 1631   Or  . ondansetron (ZOFRAN) injection 4 mg  4 mg Intravenous Q6H PRN Rise Patience, MD      . sevelamer carbonate (RENVELA) tablet 2,400 mg  2,400 mg Oral TID WC Jamal Maes, MD   2,400 mg at 10/18/15 1653  . sodium chloride flush (NS) 0.9 % injection 3 mL  3 mL Intravenous Q12H Rise Patience, MD   3 mL at 10/18/15 2109     Discharge Medications: Please see discharge summary for a list of discharge medications.  Relevant Imaging Results:  Relevant Lab Results:   Additional Information SSN: [id number], Dialysis- awaiting OP set up  Caroline Sauger, LCSW

## 2015-10-19 NOTE — Procedures (Signed)
Pt does not wish to wear cpap tonight, will inform RT if anything changes. RT will continue to monitor.

## 2015-10-19 NOTE — Procedures (Signed)
I was present at this dialysis session. I have reviewed the session itself and made appropriate changes.   Toleratingw ell. Goal UF 4L. 3K bath.  TDC.     Recent Labs Lab 10/19/15 0529  NA 140  K 4.0  CL 98*  CO2 29  GLUCOSE 75  BUN 32*  CREATININE 7.34*  CALCIUM 9.0  PHOS 3.8     Recent Labs Lab 10/16/15 0632 10/17/15 0511 10/19/15 0800  WBC 14.0* 12.4* 8.7  NEUTROABS  --  9.4*  --   HGB 9.7* 9.3* 9.3*  HCT 31.6* 30.0* 31.0*  MCV 89.0 87.7 89.9  PLT 261 269 314    Scheduled Meds: . amLODipine  10 mg Oral QHS  . aspirin EC  81 mg Oral Daily  . atorvastatin  40 mg Oral Daily  . darbepoetin (ARANESP) injection - DIALYSIS  100 mcg Intravenous Q Wed-HD  . doxercalciferol  1 mcg Intravenous Q M,W,F-HD  . febuxostat  40 mg Oral Daily  . ferric gluconate (FERRLECIT/NULECIT) IV  125 mg Intravenous Q M,W,F-HD  . heparin subcutaneous  5,000 Units Subcutaneous Q8H  . metoprolol succinate  50 mg Oral BID  . multivitamin with minerals  1 tablet Oral Daily  . neomycin-bacitracin-polymyxin   Topical Daily  . sevelamer carbonate  2,400 mg Oral TID WC  . sodium chloride flush  3 mL Intravenous Q12H   Continuous Infusions:  PRN Meds:.acetaminophen **OR** acetaminophen, hydrALAZINE, hydrocortisone cream, lactulose, lip balm, nitroGLYCERIN, ondansetron **OR** ondansetron (ZOFRAN) IV   Pearson Grippe  MD 10/19/2015, 9:36 AM

## 2015-10-20 LAB — RENAL FUNCTION PANEL
ALBUMIN: 2.1 g/dL — AB (ref 3.5–5.0)
ANION GAP: 13 (ref 5–15)
BUN: 21 mg/dL — AB (ref 6–20)
CO2: 30 mmol/L (ref 22–32)
Calcium: 9.2 mg/dL (ref 8.9–10.3)
Chloride: 95 mmol/L — ABNORMAL LOW (ref 101–111)
Creatinine, Ser: 5.25 mg/dL — ABNORMAL HIGH (ref 0.44–1.00)
GFR calc Af Amer: 10 mL/min — ABNORMAL LOW (ref >60)
GFR calc non Af Amer: 8 mL/min — ABNORMAL LOW (ref >60)
GLUCOSE: 90 mg/dL (ref 65–99)
POTASSIUM: 4.4 mmol/L (ref 3.5–5.1)
Phosphorus: 3.2 mg/dL (ref 2.5–4.6)
SODIUM: 138 mmol/L (ref 135–145)

## 2015-10-20 LAB — CBC
HEMATOCRIT: 34.8 % — AB (ref 36.0–46.0)
HEMOGLOBIN: 10.4 g/dL — AB (ref 12.0–15.0)
MCH: 26.9 pg (ref 26.0–34.0)
MCHC: 29.9 g/dL — AB (ref 30.0–36.0)
MCV: 90.2 fL (ref 78.0–100.0)
Platelets: 283 10*3/uL (ref 150–400)
RBC: 3.86 MIL/uL — ABNORMAL LOW (ref 3.87–5.11)
RDW: 15.7 % — AB (ref 11.5–15.5)
WBC: 9.4 10*3/uL (ref 4.0–10.5)

## 2015-10-20 LAB — GLUCOSE, CAPILLARY
Glucose-Capillary: 86 mg/dL (ref 65–99)
Glucose-Capillary: 109 mg/dL — ABNORMAL HIGH (ref 65–99)
Glucose-Capillary: 109 mg/dL — ABNORMAL HIGH (ref 65–99)
Glucose-Capillary: 113 mg/dL — ABNORMAL HIGH (ref 65–99)

## 2015-10-20 MED ORDER — SIMETHICONE 80 MG PO CHEW
80.0000 mg | CHEWABLE_TABLET | Freq: Four times a day (QID) | ORAL | Status: DC | PRN
  Administered 2015-10-20 – 2015-10-21 (×3): 80 mg via ORAL
  Filled 2015-10-20 (×3): qty 1

## 2015-10-20 NOTE — Progress Notes (Signed)
Patient refused CPAP at this time. RT will continue to monitor as needed.

## 2015-10-20 NOTE — Progress Notes (Signed)
PROGRESS NOTE  [name] [id number] DOB: Sep 30, 1955 DOA: 10/04/2015 PCP: Dwan Bolt, MD   LOS: 15 days   Brief Narrative: Patient was admitted on 10/04/2015, with complaint of shortness of breath progressively worsening without any chest pain or cough or fever or chills, was found to have acute on chronic diastolic dysfunction with elevated troponin and elevated creatinine. Patient also had hyperkalemia with metabolic acidosis. Patient was treated conservatively with Lasix in the ER but later on transferred to [facility] on admission. Initial AV fistula hemodialysis was not successful and temporary hemodialysis catheter was placed. As per vascular surgery the AV fistula will be superficialized. Currently further plan is continue hemodialysis and arrange for outpatient discharge HD.  Assessment & Plan: Principal Problem:   Acute pulmonary edema (HCC) Active Problems:   Morbid obesity ([place])   Renal failure (ARF), acute on chronic (HCC)   Anemia in chronic kidney disease   Type 2 diabetes mellitus with renal complication (HCC)   Hypertension   Elevated troponin   Hypoglycemia   Occult blood positive stool   Chronic anemia  Acute hypoxic respiratory failure due to Acute pulmonary edema (HCC) and Suspected acute on chronic diastolic dysfunction. - Oxygenation is significantly improving with fluid removal, now fluid management per HD - Left arm fistula placed in 07/25/2015. Unable to use at present due to being too deep - Tunneled HD catheter on May 9. - Hemodialysis started on May 5 with temporary catheter and will need lifelong hemodialysis an outpatient HD clip - AV fistula will require future procedures later and patient will need outpatient follow-up with vascular surgery. - Patient will need to be able to tolerate hemodialysis in chair in an outpatient setting which she is unable to perform at present and therefore discharge was held up.  - overall weight since  admission 412 >> 357  Generalized fatigue and weakness with physical deconditioning. - Likely due to morbid obesity and prolonged hospital stay - TSH & cortisol level unremarkable. - Hemodynamically stable. Blood cultures no growth to date. - H&H improved after transfusion, improvement in weakness after transfusion. - Patient will need significant physical therapy. - As per CIR not a candidate for CIR and will need SNF.  Elevated troponin. - Troponin was 11 on May 6 - Echocardiogram does not show any acute wall motion abnormality EKG does not show any acute ischemic changes. - Currently on aspirin and statin and beta blocker. - Cardiology was consulted and appreciate input, medical management for now  Acute encephalopathy. - Currently resolving. - Avoid sedating medications.  Essential hypertension. - Blood pressure getting stable after hemodialysis initiation. - Continue amlodipine and metoprolol.  Anemia of chronic kidney disease. - Iron studies, folic acid, TSH within normal limits. - B-12 elevated. - No active bleeding. - Transfused 1 unit on May of 9, due to significant weakness and monitor clinically. - Monitor serial H&H.  Obesity hypoventilation syndrome. - Resume home CPap.  Constipation >> diarrhea - Currently resolved, Miralax / Senokot discontinued at patient's request since she is having diarrhea - diarrhea resolved - now bloated  DVT prophylaxis: heparin Code Status: Full code Family Communication: d/w sister bedside Disposition Plan: SNF when ready  Consultants:   Nephrology   Vascular surgery  Cardiology   Procedures:   2D echo  Antimicrobials:  None    Subjective: - feels overall good today with no major complaints  Objective: Filed Vitals:   10/19/15 1808 10/19/15 2048 10/20/15 0535 10/20/15 0731  BP: 131/70 114/72  124/75 128/67  Pulse: 91 93 88 91  Temp: 98.6 F (37 C) 98.6 F (37 C) 97.7 F (36.5 C) 97.8 F (36.6 C)    TempSrc: Oral Oral Oral Oral  Resp: 18 19 19 18   Height:      Weight:      SpO2: 100% 96% 97% 97%    Intake/Output Summary (Last 24 hours) at 10/20/15 1449 Last data filed at 10/20/15 0954  Gross per 24 hour  Intake    780 ml  Output      0 ml  Net    780 ml   Filed Weights   10/17/15 1113 10/19/15 0755 10/19/15 1204  Weight: 167 kg (368 lb 2.7 oz) 166 kg (365 lb 15.4 oz) 162 kg (357 lb 2.3 oz)    Examination: Constitutional: NAD Filed Vitals:   10/19/15 1808 10/19/15 2048 10/20/15 0535 10/20/15 0731  BP: 131/70 114/72 124/75 128/67  Pulse: 91 93 88 91  Temp: 98.6 F (37 C) 98.6 F (37 C) 97.7 F (36.5 C) 97.8 F (36.6 C)  TempSrc: Oral Oral Oral Oral  Resp: 18 19 19 18   Height:      Weight:      SpO2: 100% 96% 97% 97%   Respiratory: clear to auscultation bilaterally, no wheezing, no crackles.  Cardiovascular: Regular rate and rhythm, no murmurs / rubs / gallops. 1+ LE edema. 2+ pedal pulses.  Abdomen: no tenderness. Bowel sounds positive.    Data Reviewed: I have personally reviewed following labs and imaging studies  CBC:  Recent Labs Lab 10/15/15 0835 10/16/15 0632 10/17/15 0511 10/19/15 0800 10/20/15 0601  WBC 11.6* 14.0* 12.4* 8.7 9.4  NEUTROABS  --   --  9.4*  --   --   HGB 9.3* 9.7* 9.3* 9.3* 10.4*  HCT 30.3* 31.6* 30.0* 31.0* 34.8*  MCV 88.9 89.0 87.7 89.9 90.2  PLT 252 261 269 314 [id number]   Basic Metabolic Panel:  Recent Labs Lab 10/16/15 0632 10/17/15 0511 10/18/15 0618 10/19/15 0529 10/20/15 0601  NA 138 137  137 139 140 138  K 4.3 4.5  4.4 4.1 4.0 4.4  CL 95* 96*  96* 98* 98* 95*  CO2 29 27  27 28 29 30   GLUCOSE 88 74  73 92 75 90  BUN 24* 36*  37* 21* 32* 21*  CREATININE 6.04* 7.42*  7.33* 5.32* 7.34* 5.25*  CALCIUM 8.9 8.6*  8.6* 8.6* 9.0 9.2  PHOS 3.8 4.7* 3.6 3.8 3.2   GFR: Estimated Creatinine Clearance: 18.5 mL/min (by C-G formula based on Cr of 5.25). Liver Function Tests:  Recent Labs Lab 10/16/15 [id number]  10/17/15 0511 10/18/15 0618 10/19/15 0529 10/20/15 0601  AST  --  23  --   --   --   ALT  --  28  --   --   --   ALKPHOS  --  117  --   --   --   BILITOT  --  0.4  --   --   --   PROT  --  6.4*  --   --   --   ALBUMIN 1.9* 1.8*  1.8* 1.9* 2.0* 2.1*   No results for input(s): LIPASE, AMYLASE in the last 168 hours. No results for input(s): AMMONIA in the last 168 hours. Coagulation Profile: No results for input(s): INR, PROTIME in the last 168 hours. Cardiac Enzymes: No results for input(s): CKTOTAL, CKMB, CKMBINDEX, TROPONINI in the last 168 hours. BNP (last 3 results)  No results for input(s): PROBNP in the last 8760 hours. HbA1C: No results for input(s): HGBA1C in the last 72 hours. CBG:  Recent Labs Lab 10/19/15 1236 10/19/15 1645 10/19/15 2047 10/20/15 0728 10/20/15 1132  GLUCAP 76 105* 101* 86 109*   Lipid Profile: No results for input(s): CHOL, HDL, LDLCALC, TRIG, CHOLHDL, LDLDIRECT in the last 72 hours. Thyroid Function Tests: No results for input(s): TSH, T4TOTAL, FREET4, T3FREE, THYROIDAB in the last 72 hours. Anemia Panel: No results for input(s): VITAMINB12, FOLATE, FERRITIN, TIBC, IRON, RETICCTPCT in the last 72 hours. Urine analysis: No results found for: COLORURINE, APPEARANCEUR, LABSPEC, PHURINE, GLUCOSEU, HGBUR, BILIRUBINUR, KETONESUR, PROTEINUR, UROBILINOGEN, NITRITE, LEUKOCYTESUR Sepsis Labs: Invalid input(s): PROCALCITONIN, LACTICIDVEN  Recent Results (from the past 240 hour(s))  Culture, blood (routine x 2)     Status: None   Collection Time: 10/12/15  5:25 PM  Result Value Ref Range Status   Specimen Description BLOOD HEMODIALYSIS CATHETER  Final   Special Requests BOTTLES DRAWN AEROBIC AND ANAEROBIC 10CC  Final   Culture NO GROWTH 5 DAYS  Final   Report Status 10/17/2015 FINAL  Final  Culture, blood (routine x 2)     Status: None   Collection Time: 10/12/15  5:25 PM  Result Value Ref Range Status   Specimen Description BLOOD HEMODIALYSIS  CATHETER  Final   Special Requests BOTTLES DRAWN AEROBIC AND ANAEROBIC 10CC  Final   Culture NO GROWTH 5 DAYS  Final   Report Status 10/17/2015 FINAL  Final      Radiology Studies: No results found.   Scheduled Meds: . amLODipine  10 mg Oral QHS  . aspirin EC  81 mg Oral Daily  . atorvastatin  40 mg Oral Daily  . darbepoetin (ARANESP) injection - DIALYSIS  100 mcg Intravenous Q Wed-HD  . doxercalciferol  1 mcg Intravenous Q M,W,F-HD  . febuxostat  40 mg Oral Daily  . ferric gluconate (FERRLECIT/NULECIT) IV  125 mg Intravenous Q M,W,F-HD  . heparin subcutaneous  5,000 Units Subcutaneous Q8H  . metoprolol succinate  50 mg Oral BID  . multivitamin with minerals  1 tablet Oral Daily  . neomycin-bacitracin-polymyxin   Topical Daily  . sevelamer carbonate  2,400 mg Oral TID WC  . sodium chloride flush  3 mL Intravenous Q12H   Continuous Infusions:    Marzetta Board, MD, PhD [facility] Pager [phone]  If 7PM-7AM, please contact night-coverage [email or web address] Password [email or web address] 10/20/2015, 2:49 PM

## 2015-10-20 NOTE — Progress Notes (Signed)
Admit: 10/04/2015 LOS: 15  69F new ESRD, morbid obesity limiting access to outpt HD, DM2, Deconditioning/impaired mobility  Subjective:  HD yesterday with tight heparin, 4L UF, full Tx No new events Weigth down to 162kg   05/19 0701 - 05/20 0700 In: 783 [P.O.:780; I.V.:3] Out: 3950   Filed Weights   10/17/15 1113 10/19/15 0755 10/19/15 1204  Weight: 167 kg (368 lb 2.7 oz) 166 kg (365 lb 15.4 oz) 162 kg (357 lb 2.3 oz)    Scheduled Meds: . amLODipine  10 mg Oral QHS  . aspirin EC  81 mg Oral Daily  . atorvastatin  40 mg Oral Daily  . darbepoetin (ARANESP) injection - DIALYSIS  100 mcg Intravenous Q Wed-HD  . doxercalciferol  1 mcg Intravenous Q M,W,F-HD  . febuxostat  40 mg Oral Daily  . ferric gluconate (FERRLECIT/NULECIT) IV  125 mg Intravenous Q M,W,F-HD  . heparin subcutaneous  5,000 Units Subcutaneous Q8H  . metoprolol succinate  50 mg Oral BID  . multivitamin with minerals  1 tablet Oral Daily  . neomycin-bacitracin-polymyxin   Topical Daily  . sevelamer carbonate  2,400 mg Oral TID WC  . sodium chloride flush  3 mL Intravenous Q12H   Continuous Infusions:  PRN Meds:.acetaminophen **OR** acetaminophen, hydrALAZINE, hydrocortisone cream, lactulose, lip balm, nitroGLYCERIN, ondansetron **OR** ondansetron (ZOFRAN) IV  Current Labs: reviewed    Physical Exam:  Blood pressure 128/67, pulse 91, temperature 97.8 F (36.6 C), temperature source Oral, resp. rate 18, height 5\' 6"  (1.676 m), weight 162 kg (357 lb 2.3 oz), SpO2 97 %. Morbidly obese, NAD LUE AVF B/T+, hematoma stable RIJ TDC RRR no rub Coarse bs ant ABD obese, soft  A 1. New ESRD 2. Morbid Obesity / Impaired Mobility / Deconditioning 3. Anemia: on ESA qWed, rec Fe 4. BMD: On VDRA and Renvela 5. Recent AVF infiltration, VVS following 6. HLD 7. DM2 8. HTN 9. Gout  P 1. Cont HD on MWF schedule: Tight heparin, Progressive probing for lower post HD weights 2. Challenge EDW consecutively 3. Cont  PT/OT  Pearson Grippe MD 10/20/2015, 9:32 AM   Recent Labs Lab 10/18/15 0618 10/19/15 0529 10/20/15 0601  NA 139 140 138  K 4.1 4.0 4.4  CL 98* 98* 95*  CO2 28 29 30   GLUCOSE 92 75 90  BUN 21* 32* 21*  CREATININE 5.32* 7.34* 5.25*  CALCIUM 8.6* 9.0 9.2  PHOS 3.6 3.8 3.2    Recent Labs Lab 10/17/15 0511 10/19/15 0800 10/20/15 0601  WBC 12.4* 8.7 9.4  NEUTROABS 9.4*  --   --   HGB 9.3* 9.3* 10.4*  HCT 30.0* 31.0* 34.8*  MCV 87.7 89.9 90.2  PLT 269 314 283

## 2015-10-21 LAB — RENAL FUNCTION PANEL
ANION GAP: 12 (ref 5–15)
Albumin: 2.1 g/dL — ABNORMAL LOW (ref 3.5–5.0)
BUN: 32 mg/dL — ABNORMAL HIGH (ref 6–20)
CALCIUM: 9.2 mg/dL (ref 8.9–10.3)
CHLORIDE: 95 mmol/L — AB (ref 101–111)
CO2: 29 mmol/L (ref 22–32)
Creatinine, Ser: 7.1 mg/dL — ABNORMAL HIGH (ref 0.44–1.00)
GFR calc non Af Amer: 6 mL/min — ABNORMAL LOW (ref >60)
GFR, EST AFRICAN AMERICAN: 7 mL/min — AB (ref >60)
Glucose, Bld: 87 mg/dL (ref 65–99)
POTASSIUM: 4.4 mmol/L (ref 3.5–5.1)
Phosphorus: 3.4 mg/dL (ref 2.5–4.6)
Sodium: 136 mmol/L (ref 135–145)

## 2015-10-21 LAB — GLUCOSE, CAPILLARY
GLUCOSE-CAPILLARY: 105 mg/dL — AB (ref 65–99)
GLUCOSE-CAPILLARY: 95 mg/dL (ref 65–99)
GLUCOSE-CAPILLARY: 98 mg/dL (ref 65–99)
Glucose-Capillary: 93 mg/dL (ref 65–99)

## 2015-10-21 MED ORDER — SENNOSIDES-DOCUSATE SODIUM 8.6-50 MG PO TABS
2.0000 | ORAL_TABLET | Freq: Two times a day (BID) | ORAL | Status: DC
  Administered 2015-10-21 – 2015-11-05 (×23): 2 via ORAL
  Filled 2015-10-21 (×27): qty 2

## 2015-10-21 MED ORDER — HYDROCORTISONE 2.5 % RE CREA
TOPICAL_CREAM | Freq: Two times a day (BID) | RECTAL | Status: DC
  Administered 2015-10-21 – 2015-10-25 (×5): via RECTAL
  Administered 2015-10-25: 1 via RECTAL
  Administered 2015-10-26 – 2015-10-27 (×4): via RECTAL
  Filled 2015-10-21 (×3): qty 28.35

## 2015-10-21 NOTE — Progress Notes (Signed)
PROGRESS NOTE  [name] [id number] DOB: 08-Feb-1956 DOA: 10/04/2015 PCP: Dwan Bolt, MD   LOS: 16 days   Brief Narrative: Patient was admitted on 10/04/2015, with complaint of shortness of breath progressively worsening without any chest pain or cough or fever or chills, was found to have acute on chronic diastolic dysfunction with elevated troponin and elevated creatinine. Patient also had hyperkalemia with metabolic acidosis. Patient was treated conservatively with Lasix in the ER but later on transferred to [facility] on admission. Initial AV fistula hemodialysis was not successful and temporary hemodialysis catheter was placed. As per vascular surgery the AV fistula will be superficialized. Currently further plan is continue hemodialysis and arrange for outpatient discharge HD.  Assessment & Plan: Principal Problem:   Acute pulmonary edema (HCC) Active Problems:   Morbid obesity ([place])   Renal failure (ARF), acute on chronic (HCC)   Anemia in chronic kidney disease   Type 2 diabetes mellitus with renal complication (HCC)   Hypertension   Elevated troponin   Hypoglycemia   Occult blood positive stool   Chronic anemia  Acute hypoxic respiratory failure due to Acute pulmonary edema (HCC) and Suspected acute on chronic diastolic dysfunction. - Oxygenation is significantly improving with fluid removal, now fluid management per HD - Left arm fistula placed in 07/25/2015. Unable to use at present due to being too deep - Tunneled HD catheter on May 9. - Hemodialysis started on May 5 with temporary catheter and will need lifelong hemodialysis an outpatient HD clip - AV fistula will require future procedures later and patient will need outpatient follow-up with vascular surgery. - Patient will need to be able to tolerate hemodialysis in chair in an outpatient setting which she is unable to perform at present and therefore discharge was held up.  - overall weight since  admission 412 >> 357  Generalized fatigue and weakness with physical deconditioning. - Likely due to morbid obesity and prolonged hospital stay - TSH & cortisol level unremarkable. - Hemodynamically stable. Blood cultures no growth to date. - H&H improved after transfusion, improvement in weakness after transfusion. - Patient will need significant physical therapy. - As per CIR not a candidate for CIR and will need SNF.  Elevated troponin. - Troponin was 11 on May 6 - Echocardiogram does not show any acute wall motion abnormality EKG does not show any acute ischemic changes. - Currently on aspirin and statin and beta blocker. - Cardiology was consulted and appreciate input, medical management for now  Acute encephalopathy. - Currently resolving. - Avoid sedating medications.  Essential hypertension. - Blood pressure getting stable after hemodialysis initiation. - Continue amlodipine and metoprolol.  Anemia of chronic kidney disease. - Iron studies, folic acid, TSH within normal limits. - B-12 elevated. - No active bleeding. - Transfused 1 unit on May of 9, due to significant weakness and monitor clinically. - Monitor serial H&H.  Obesity hypoventilation syndrome. - Resume home CPap.  Constipation >> diarrhea - Currently resolved, Miralax / Senokot discontinued at patient's request since she is having diarrhea - diarrhea resolved - Add back Senokot for constipation  DVT prophylaxis: heparin Code Status: Full code Family Communication: d/w sister bedside Disposition Plan: SNF when ready  Consultants:   Nephrology   Vascular surgery  Cardiology   Procedures:   2D echo  Antimicrobials:  None    Subjective: - No complaints, denies any shortness of breath, denies any chest pain  Objective: Filed Vitals:   10/20/15 [id number] 10/20/15 2020 10/21/15 [id number]  10/21/15 0734  BP: 128/67 127/60 113/54 144/78  Pulse: 91 90 88 91  Temp: 97.8 F (36.6 C) 98.8 F (37.1 C)  98.4 F (36.9 C) 98.4 F (36.9 C)  TempSrc: Oral Oral Oral Oral  Resp: 18 20 18 17   Height:      Weight:   164 kg (361 lb 8.9 oz)   SpO2: 97% 95% 91% 91%    Intake/Output Summary (Last 24 hours) at 10/21/15 1342 Last data filed at 10/21/15 1000  Gross per 24 hour  Intake    240 ml  Output      0 ml  Net    240 ml   Filed Weights   10/19/15 0755 10/19/15 1204 10/21/15 0537  Weight: 166 kg (365 lb 15.4 oz) 162 kg (357 lb 2.3 oz) 164 kg (361 lb 8.9 oz)    Examination: Constitutional: NAD Filed Vitals:   10/20/15 0731 10/20/15 2020 10/21/15 0537 10/21/15 0734  BP: 128/67 127/60 113/54 144/78  Pulse: 91 90 88 91  Temp: 97.8 F (36.6 C) 98.8 F (37.1 C) 98.4 F (36.9 C) 98.4 F (36.9 C)  TempSrc: Oral Oral Oral Oral  Resp: 18 20 18 17   Height:      Weight:   164 kg (361 lb 8.9 oz)   SpO2: 97% 95% 91% 91%   Respiratory: clear to auscultation bilaterally, no wheezing, no crackles.  Cardiovascular: Regular rate and rhythm, no murmurs / rubs / gallops. trace LE edema. 2+ pedal pulses.  Abdomen: no tenderness. Bowel sounds positive.    Data Reviewed: I have personally reviewed following labs and imaging studies  CBC:  Recent Labs Lab 10/15/15 0835 10/16/15 0632 10/17/15 0511 10/19/15 0800 10/20/15 0601  WBC 11.6* 14.0* 12.4* 8.7 9.4  NEUTROABS  --   --  9.4*  --   --   HGB 9.3* 9.7* 9.3* 9.3* 10.4*  HCT 30.3* 31.6* 30.0* 31.0* 34.8*  MCV 88.9 89.0 87.7 89.9 90.2  PLT 252 261 269 314 [id number]   Basic Metabolic Panel:  Recent Labs Lab 10/17/15 0511 10/18/15 0618 10/19/15 0529 10/20/15 0601 10/21/15 0556  NA 137  137 139 140 138 136  K 4.5  4.4 4.1 4.0 4.4 4.4  CL 96*  96* 98* 98* 95* 95*  CO2 27  27 28 29 30 29   GLUCOSE 74  73 92 75 90 87  BUN 36*  37* 21* 32* 21* 32*  CREATININE 7.42*  7.33* 5.32* 7.34* 5.25* 7.10*  CALCIUM 8.6*  8.6* 8.6* 9.0 9.2 9.2  PHOS 4.7* 3.6 3.8 3.2 3.4   GFR: Estimated Creatinine Clearance: 13.8 mL/min (by C-G formula  based on Cr of 7.1). Liver Function Tests:  Recent Labs Lab 10/17/15 0511 10/18/15 0618 10/19/15 0529 10/20/15 0601 10/21/15 0556  AST 23  --   --   --   --   ALT 28  --   --   --   --   ALKPHOS 117  --   --   --   --   BILITOT 0.4  --   --   --   --   PROT 6.4*  --   --   --   --   ALBUMIN 1.8*  1.8* 1.9* 2.0* 2.1* 2.1*   No results for input(s): LIPASE, AMYLASE in the last 168 hours. No results for input(s): AMMONIA in the last 168 hours. Coagulation Profile: No results for input(s): INR, PROTIME in the last 168 hours. Cardiac Enzymes: No results  for input(s): CKTOTAL, CKMB, CKMBINDEX, TROPONINI in the last 168 hours. BNP (last 3 results) No results for input(s): PROBNP in the last 8760 hours. HbA1C: No results for input(s): HGBA1C in the last 72 hours. CBG:  Recent Labs Lab 10/20/15 1132 10/20/15 1532 10/20/15 2019 10/21/15 0730 10/21/15 1138  GLUCAP 109* 109* 113* 93 105*   Lipid Profile: No results for input(s): CHOL, HDL, LDLCALC, TRIG, CHOLHDL, LDLDIRECT in the last 72 hours. Thyroid Function Tests: No results for input(s): TSH, T4TOTAL, FREET4, T3FREE, THYROIDAB in the last 72 hours. Anemia Panel: No results for input(s): VITAMINB12, FOLATE, FERRITIN, TIBC, IRON, RETICCTPCT in the last 72 hours. Urine analysis: No results found for: COLORURINE, APPEARANCEUR, LABSPEC, PHURINE, GLUCOSEU, HGBUR, BILIRUBINUR, KETONESUR, PROTEINUR, UROBILINOGEN, NITRITE, LEUKOCYTESUR Sepsis Labs: Invalid input(s): PROCALCITONIN, LACTICIDVEN  Recent Results (from the past 240 hour(s))  Culture, blood (routine x 2)     Status: None   Collection Time: 10/12/15  5:25 PM  Result Value Ref Range Status   Specimen Description BLOOD HEMODIALYSIS CATHETER  Final   Special Requests BOTTLES DRAWN AEROBIC AND ANAEROBIC 10CC  Final   Culture NO GROWTH 5 DAYS  Final   Report Status 10/17/2015 FINAL  Final  Culture, blood (routine x 2)     Status: None   Collection Time: 10/12/15  5:25  PM  Result Value Ref Range Status   Specimen Description BLOOD HEMODIALYSIS CATHETER  Final   Special Requests BOTTLES DRAWN AEROBIC AND ANAEROBIC 10CC  Final   Culture NO GROWTH 5 DAYS  Final   Report Status 10/17/2015 FINAL  Final      Radiology Studies: No results found.   Scheduled Meds: . amLODipine  10 mg Oral QHS  . aspirin EC  81 mg Oral Daily  . atorvastatin  40 mg Oral Daily  . darbepoetin (ARANESP) injection - DIALYSIS  100 mcg Intravenous Q Wed-HD  . doxercalciferol  1 mcg Intravenous Q M,W,F-HD  . febuxostat  40 mg Oral Daily  . ferric gluconate (FERRLECIT/NULECIT) IV  125 mg Intravenous Q M,W,F-HD  . heparin subcutaneous  5,000 Units Subcutaneous Q8H  . metoprolol succinate  50 mg Oral BID  . multivitamin with minerals  1 tablet Oral Daily  . neomycin-bacitracin-polymyxin   Topical Daily  . senna-docusate  2 tablet Oral BID  . sevelamer carbonate  2,400 mg Oral TID WC  . sodium chloride flush  3 mL Intravenous Q12H   Continuous Infusions:    Marzetta Board, MD, PhD [facility] Pager [phone]  If 7PM-7AM, please contact night-coverage [email or web address] Password [email or web address] 10/21/2015, 1:42 PM

## 2015-10-21 NOTE — Progress Notes (Signed)
Admit: 10/04/2015 LOS: 16  65F new ESRD, morbid obesity limiting access to outpt HD, DM2, Deconditioning/impaired mobility  Subjective:  No new events   05/20 0701 - 05/21 0700 In: 240 [P.O.:240] Out: 0   Filed Weights   10/19/15 0755 10/19/15 1204 10/21/15 0537  Weight: 166 kg (365 lb 15.4 oz) 162 kg (357 lb 2.3 oz) 164 kg (361 lb 8.9 oz)    Scheduled Meds: . amLODipine  10 mg Oral QHS  . aspirin EC  81 mg Oral Daily  . atorvastatin  40 mg Oral Daily  . darbepoetin (ARANESP) injection - DIALYSIS  100 mcg Intravenous Q Wed-HD  . doxercalciferol  1 mcg Intravenous Q M,W,F-HD  . febuxostat  40 mg Oral Daily  . ferric gluconate (FERRLECIT/NULECIT) IV  125 mg Intravenous Q M,W,F-HD  . heparin subcutaneous  5,000 Units Subcutaneous Q8H  . metoprolol succinate  50 mg Oral BID  . multivitamin with minerals  1 tablet Oral Daily  . neomycin-bacitracin-polymyxin   Topical Daily  . senna-docusate  2 tablet Oral BID  . sevelamer carbonate  2,400 mg Oral TID WC  . sodium chloride flush  3 mL Intravenous Q12H   Continuous Infusions:  PRN Meds:.acetaminophen **OR** acetaminophen, hydrALAZINE, hydrocortisone cream, lactulose, lip balm, nitroGLYCERIN, ondansetron **OR** ondansetron (ZOFRAN) IV, simethicone  Current Labs: reviewed    Physical Exam:  Blood pressure 144/78, pulse 91, temperature 98.4 F (36.9 C), temperature source Oral, resp. rate 17, height 5\' 6"  (1.676 m), weight 164 kg (361 lb 8.9 oz), SpO2 91 %. Morbidly obese, NAD LUE AVF B/T+, hematoma stable RIJ TDC RRR no rub Coarse bs ant ABD obese, soft  A 1. New ESRD 2. Morbid Obesity / Impaired Mobility / Deconditioning 3. Anemia: on ESA qWed, rec Fe 4. BMD: On VDRA and Renvela 5. Recent AVF infiltration, VVS following -- using TDC right now 6. HLD 7. DM2 8. HTN 9. Gout  P 1. Cont HD on MWF schedule: Tight heparin, Progressive probing for lower post HD weights 2. Challenge EDW consecutively 3. Cont PT/OT  Pearson Grippe MD 10/21/2015, 10:14 AM   Recent Labs Lab 10/19/15 0529 10/20/15 0601 10/21/15 0556  NA 140 138 136  K 4.0 4.4 4.4  CL 98* 95* 95*  CO2 29 30 29   GLUCOSE 75 90 87  BUN 32* 21* 32*  CREATININE 7.34* 5.25* 7.10*  CALCIUM 9.0 9.2 9.2  PHOS 3.8 3.2 3.4    Recent Labs Lab 10/17/15 0511 10/19/15 0800 10/20/15 0601  WBC 12.4* 8.7 9.4  NEUTROABS 9.4*  --   --   HGB 9.3* 9.3* 10.4*  HCT 30.0* 31.0* 34.8*  MCV 87.7 89.9 90.2  PLT 269 314 283

## 2015-10-21 NOTE — Progress Notes (Signed)
Set patient's home CPAP up and she states that she will place it on when ready. RT will continue to monitor.

## 2015-10-21 NOTE — Progress Notes (Signed)
Patient has been up in the chair today.

## 2015-10-22 LAB — CBC
HCT: 33.3 % — ABNORMAL LOW (ref 36.0–46.0)
HEMOGLOBIN: 10.5 g/dL — AB (ref 12.0–15.0)
MCH: 27.6 pg (ref 26.0–34.0)
MCHC: 31.5 g/dL (ref 30.0–36.0)
MCV: 87.6 fL (ref 78.0–100.0)
PLATELETS: 308 10*3/uL (ref 150–400)
RBC: 3.8 MIL/uL — AB (ref 3.87–5.11)
RDW: 15.9 % — ABNORMAL HIGH (ref 11.5–15.5)
WBC: 8.1 10*3/uL (ref 4.0–10.5)

## 2015-10-22 LAB — RENAL FUNCTION PANEL
ALBUMIN: 2.2 g/dL — AB (ref 3.5–5.0)
ANION GAP: 11 (ref 5–15)
BUN: 43 mg/dL — AB (ref 6–20)
CALCIUM: 8.9 mg/dL (ref 8.9–10.3)
CO2: 26 mmol/L (ref 22–32)
Chloride: 98 mmol/L — ABNORMAL LOW (ref 101–111)
Creatinine, Ser: 8.57 mg/dL — ABNORMAL HIGH (ref 0.44–1.00)
GFR calc Af Amer: 5 mL/min — ABNORMAL LOW (ref >60)
GFR, EST NON AFRICAN AMERICAN: 5 mL/min — AB (ref >60)
Glucose, Bld: 123 mg/dL — ABNORMAL HIGH (ref 65–99)
PHOSPHORUS: 3.2 mg/dL (ref 2.5–4.6)
POTASSIUM: 5 mmol/L (ref 3.5–5.1)
Sodium: 135 mmol/L (ref 135–145)

## 2015-10-22 LAB — GLUCOSE, CAPILLARY
Glucose-Capillary: 113 mg/dL — ABNORMAL HIGH (ref 65–99)
Glucose-Capillary: 91 mg/dL (ref 65–99)
Glucose-Capillary: 112 mg/dL — ABNORMAL HIGH (ref 65–99)
Glucose-Capillary: 90 mg/dL (ref 65–99)

## 2015-10-22 MED ORDER — POLYETHYLENE GLYCOL 3350 17 G PO PACK: 17.0000 g | PACK | ORAL | Status: AC

## 2015-10-22 MED ORDER — HEPARIN SODIUM (PORCINE) 1000 UNIT/ML DIALYSIS: 20.0000 [IU]/kg | INTRAMUSCULAR | Status: DC | PRN

## 2015-10-22 MED ORDER — ALTEPLASE 2 MG IJ SOLR
INTRAMUSCULAR | Status: AC
  Filled 2015-10-22: qty 2

## 2015-10-22 MED ORDER — ALTEPLASE 2 MG IJ SOLR: 2.0000 mg | Freq: Once | INTRAMUSCULAR | Status: DC

## 2015-10-22 MED ORDER — LACTULOSE 10 GM/15ML PO SOLN
20.0000 g | Freq: Two times a day (BID) | ORAL | Status: DC
  Administered 2015-10-22 – 2015-10-24 (×4): 20 g via ORAL
  Filled 2015-10-22 (×5): qty 30

## 2015-10-22 NOTE — Progress Notes (Signed)
HEMODIALYSIS: Catheter not working, arterial pressure increasing despite flushing the line and reversing line. MD paged. Treatment stop.Line TPA.

## 2015-10-22 NOTE — Progress Notes (Signed)
Gave pt soap suds enema. Pt tolerated well but no immediate results. Will continue to monitor.

## 2015-10-22 NOTE — Progress Notes (Signed)
Patient ID: [name], female   DOB: [birth year], 58 y.o.   MRN: [id number]  [facility] Progress Note   Assessment/ Plan:   1. Acute hypoxic respiratory failure secondary to pulmonary edema/diastolic CHF: Improved with hemodialysis/ultrafiltration. Continue to monitor. The biggest obstacle is her progressive deconditioning that will require intensive physical therapy/rehabilitation. 2.ESRD: To undergo hemodialysis today via IJ tunneled hemodialysis catheter. Recent infiltration of left brachiocephalic fistula that is likely too deep and would require superficialization. The process for outpatient hemodialysis unit placement  has been started.(may be limited by her deconditioning and impaired mobility). 3. Anemia: Without overt loss, hemoglobin level is acceptable current dose of Aranesp. 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 Nutrition: With morbid obesity but hypoalbuminemia from progressive CKD/inflammatory component of TDC 6. Hypertension: Blood pressures acceptable-anticipate to improve further with ultrafiltration and hemodialysis.  Subjective:   Reports to be feeling better-denies any chest pain or shortness of breath    Objective:   BP 137/79 mmHg  Pulse 82  Temp(Src) 98.2 F (36.8 C) (Oral)  Resp 18  Ht 5\' 6"  (1.676 m)  Wt 165 kg (363 lb 12.1 oz)  BMI 58.74 kg/m2  SpO2 97%  Physical Exam: [id number] sitting up in recliner, eating lunch CVS: Pulse regular rhythm, S1 and S2 normal Resp: Decreased breath sounds over bases, no rales/rhonchi Abd: Soft, obese, nontender Ext: Chronic lymphedema with some pitting edema  Labs: BMET  Recent Labs Lab 10/16/15 [id number] 10/17/15 0511 10/18/15 0618 10/19/15 0529 10/20/15 0601 10/21/15 0556 10/22/15 0416  NA 138 137  137 139 140 138 136 135  K 4.3 4.5  4.4 4.1 4.0 4.4 4.4 5.0  CL 95* 96*  96* 98* 98* 95* 95* 98*  CO2 29 27  27 28 29 30 29  26   GLUCOSE 88 74  73 92 75 90 87 123*  BUN 24* 36*  37* 21* 32* 21* 32* 43*  CREATININE 6.04* 7.42*  7.33* 5.32* 7.34* 5.25* 7.10* 8.57*  CALCIUM 8.9 8.6*  8.6* 8.6* 9.0 9.2 9.2 8.9  PHOS 3.8 4.7* 3.6 3.8 3.2 3.4 3.2   CBC  Recent Labs Lab 10/16/15 0632 10/17/15 0511 10/19/15 0800 10/20/15 0601  WBC 14.0* 12.4* 8.7 9.4  NEUTROABS  --  9.4*  --   --   HGB 9.7* 9.3* 9.3* 10.4*  HCT 31.6* 30.0* 31.0* 34.8*  MCV 89.0 87.7 89.9 90.2  PLT 261 269 314 283   Medications:    . amLODipine  10 mg Oral QHS  . aspirin EC  81 mg Oral Daily  . atorvastatin  40 mg Oral Daily  . darbepoetin (ARANESP) injection - DIALYSIS  100 mcg Intravenous Q Wed-HD  . doxercalciferol  1 mcg Intravenous Q M,W,F-HD  . febuxostat  40 mg Oral Daily  . ferric gluconate (FERRLECIT/NULECIT) IV  125 mg Intravenous Q M,W,F-HD  . heparin subcutaneous  5,000 Units Subcutaneous Q8H  . hydrocortisone   Rectal BID  . lactulose  20 g Oral BID  . metoprolol succinate  50 mg Oral BID  . multivitamin with minerals  1 tablet Oral Daily  . neomycin-bacitracin-polymyxin   Topical Daily  . senna-docusate  2 tablet Oral BID  . sevelamer carbonate  2,400 mg Oral TID WC  . sodium chloride flush  3 mL Intravenous Q12H    Elmarie Shiley, MD 10/22/2015, 12:09 PM

## 2015-10-22 NOTE — Plan of Care (Signed)
Problem: Food- and Nutrition-Related Knowledge Deficit (NB-1.1) Goal: Nutrition education Formal process to instruct or train a patient/client in a skill or to impart knowledge to help patients/clients voluntarily manage or modify food choices and eating behavior to maintain or improve health. Outcome: Completed/Met Date Met:  10/22/15 Nutrition Education Note  RD consulted for Renal Education. Provided "Eating Healthy with Kidney Disease" to patient/family. Reviewed food groups and provided written recommended serving sizes specifically determined for patient's current nutritional status.   Explained why diet restrictions are needed and provided lists of foods to limit/avoid that are high potassium, sodium, and phosphorus. Provided specific recommendations on safer alternatives of these foods. Strongly encouraged compliance of this diet.   Discussed importance of protein intake at each meal and snack. Provided examples of how to maximize protein intake throughout the day. Discussed need for fluid restriction with dialysis and renal-friendly beverage options. Teach back method used.  Expect good compliance.  Body mass index is 58.74 kg/(m^2). Pt meets criteria for morbid obesity based on current BMI.  Current diet order is renal/carb mod with fluid restriction, patient is consuming approximately 85-100% of meals at this time. Labs and medications reviewed. No further nutrition interventions warranted at this time. RD contact information provided. If additional nutrition issues arise, please re-consult RD.  Corrin Parker, MS, RD, LDN Pager # [phone] After hours/ weekend pager # [phone]

## 2015-10-22 NOTE — Consult Note (Signed)
   [facility] CM Inpatient Consult   10/22/2015  LYNNITA LAROE Jan 06, 1956 [id number]   Patient screened for potential [facility] Care Management services. Chart reviewed. Noted discharge plan is for SNF.  Spoke with inpatient RNCM to discuss discharge plans. If patient's post hospital needs change, please place a [facility] Care Management consult. For questions please contact:  Marthenia Rolling, [place], RN,BSN [facility] Liaison [phone]

## 2015-10-22 NOTE — Progress Notes (Addendum)
PROGRESS NOTE  [name] [id number] DOB: Sep 06, 1955 DOA: 10/04/2015 PCP: Dwan Bolt, MD   LOS: 17 days   Brief Narrative: Patient was admitted on 10/04/2015, with complaint of shortness of breath progressively worsening without any chest pain or cough or fever or chills, was found to have acute on chronic diastolic dysfunction with elevated troponin and elevated creatinine. Patient also had hyperkalemia with metabolic acidosis. Patient was treated conservatively with Lasix in the ER but later on transferred to [facility] on admission. Initial AV fistula hemodialysis was not successful and temporary hemodialysis catheter was placed. As per vascular surgery the AV fistula will be superficialized. Currently further plan is continue hemodialysis and arrange for outpatient discharge HD.  Assessment & Plan: Principal Problem:   Acute pulmonary edema (HCC) Active Problems:   Morbid obesity ([place])   Renal failure (ARF), acute on chronic (HCC)   Anemia in chronic kidney disease   Type 2 diabetes mellitus with renal complication (HCC)   Hypertension   Elevated troponin   Hypoglycemia   Occult blood positive stool   Chronic anemia  Acute hypoxic respiratory failure due to Acute pulmonary edema (HCC) and Suspected acute on chronic diastolic dysfunction. - Oxygenation is significantly improving with fluid removal, now fluid management per HD - Left arm fistula placed in 07/25/2015. Unable to use at present due to being too deep - Tunneled HD catheter on May 9. - Hemodialysis started on May 5 with temporary catheter and will need lifelong hemodialysis an outpatient HD clip - AV fistula will require future procedures later and patient will need outpatient follow-up with vascular surgery. - Patient will need to be able to tolerate hemodialysis in chair in an outpatient setting which she is unable to perform at present and therefore discharge was held up.  - overall weight since  admission 412 >> ~360s  Generalized fatigue and weakness with physical deconditioning. - Likely due to morbid obesity and prolonged hospital stay - TSH & cortisol level unremarkable. - Hemodynamically stable. Blood cultures no growth to date. - H&H improved after transfusion, improvement in weakness after transfusion. - Patient will need significant physical therapy. - As per CIR not a candidate for CIR and will need SNF.  Elevated troponin. - Troponin was 11 on May 6 - Echocardiogram does not show any acute wall motion abnormality EKG does not show any acute ischemic changes. - Currently on aspirin and statin and beta blocker. - Cardiology was consulted and appreciate input, medical management for now  Acute encephalopathy. - Currently resolving. - Avoid sedating medications.  Essential hypertension. - Blood pressure getting stable after hemodialysis initiation. - Continue amlodipine and metoprolol.  Anemia of chronic kidney disease. - Iron studies, folic acid, TSH within normal limits. - B-12 elevated. - No active bleeding. - Transfused 1 unit on May of 9, due to significant weakness and monitor clinically. - Monitor serial H&H.  Obesity hypoventilation syndrome. - Resume home CPap.  Constipation >> diarrhea - Currently resolved, Miralax / Senokot discontinued at patient's request since she is having diarrhea - diarrhea resolved - Senokot for constipation. Add lactulose today. Also Enema  DVT prophylaxis: heparin Code Status: Full code Family Communication: no family bedside Disposition Plan: SNF when ready  Consultants:   Nephrology   Vascular surgery  Cardiology   Procedures:   2D echo  Antimicrobials:  None    Subjective: - endorsing constipation  Objective: Filed Vitals:   10/21/15 1637 10/21/15 1952 10/22/15 0447 10/22/15 1004  BP: 125/91 146/88  115/66 137/79  Pulse: 88 86 88 82  Temp: 98.6 F (37 C) 98.2 F (36.8 C) 98.3 F (36.8 C) 98.2  F (36.8 C)  TempSrc: Oral Oral Oral Oral  Resp: 18 20 18 18   Height:      Weight:   165 kg (363 lb 12.1 oz)   SpO2: 97% 98% 97% 97%    Intake/Output Summary (Last 24 hours) at 10/22/15 1152 Last data filed at 10/22/15 0846  Gross per 24 hour  Intake    830 ml  Output      0 ml  Net    830 ml   Filed Weights   10/19/15 1204 10/21/15 0537 10/22/15 0447  Weight: 162 kg (357 lb 2.3 oz) 164 kg (361 lb 8.9 oz) 165 kg (363 lb 12.1 oz)    Examination: Constitutional: NAD Filed Vitals:   10/21/15 1637 10/21/15 1952 10/22/15 0447 10/22/15 1004  BP: 125/91 146/88 115/66 137/79  Pulse: 88 86 88 82  Temp: 98.6 F (37 C) 98.2 F (36.8 C) 98.3 F (36.8 C) 98.2 F (36.8 C)  TempSrc: Oral Oral Oral Oral  Resp: 18 20 18 18   Height:      Weight:   165 kg (363 lb 12.1 oz)   SpO2: 97% 98% 97% 97%   Respiratory: clear to auscultation bilaterally, no wheezing, no crackles.  Cardiovascular: Regular rate and rhythm, no murmurs / rubs / gallops. trace LE edema. 2+ pedal pulses.  Abdomen: no tenderness. Bowel sounds positive.    Data Reviewed: I have personally reviewed following labs and imaging studies  CBC:  Recent Labs Lab 10/16/15 0632 10/17/15 0511 10/19/15 0800 10/20/15 0601  WBC 14.0* 12.4* 8.7 9.4  NEUTROABS  --  9.4*  --   --   HGB 9.7* 9.3* 9.3* 10.4*  HCT 31.6* 30.0* 31.0* 34.8*  MCV 89.0 87.7 89.9 90.2  PLT 261 269 314 [id number]   Basic Metabolic Panel:  Recent Labs Lab 10/18/15 0618 10/19/15 0529 10/20/15 0601 10/21/15 0556 10/22/15 0416  NA 139 140 138 136 135  K 4.1 4.0 4.4 4.4 5.0  CL 98* 98* 95* 95* 98*  CO2 28 29 30 29 26   GLUCOSE 92 75 90 87 123*  BUN 21* 32* 21* 32* 43*  CREATININE 5.32* 7.34* 5.25* 7.10* 8.57*  CALCIUM 8.6* 9.0 9.2 9.2 8.9  PHOS 3.6 3.8 3.2 3.4 3.2   GFR: Estimated Creatinine Clearance: 11.5 mL/min (by C-G formula based on Cr of 8.57). Liver Function Tests:  Recent Labs Lab 10/17/15 0511 10/18/15 0618 10/19/15 0529  10/20/15 0601 10/21/15 0556 10/22/15 0416  AST 23  --   --   --   --   --   ALT 28  --   --   --   --   --   ALKPHOS 117  --   --   --   --   --   BILITOT 0.4  --   --   --   --   --   PROT 6.4*  --   --   --   --   --   ALBUMIN 1.8*  1.8* 1.9* 2.0* 2.1* 2.1* 2.2*   No results for input(s): LIPASE, AMYLASE in the last 168 hours. No results for input(s): AMMONIA in the last 168 hours. Coagulation Profile: No results for input(s): INR, PROTIME in the last 168 hours. Cardiac Enzymes: No results for input(s): CKTOTAL, CKMB, CKMBINDEX, TROPONINI in the last 168 hours. BNP (last 3  results) No results for input(s): PROBNP in the last 8760 hours. HbA1C: No results for input(s): HGBA1C in the last 72 hours. CBG:  Recent Labs Lab 10/21/15 1138 10/21/15 1635 10/21/15 2129 10/22/15 0756 10/22/15 1135  GLUCAP 105* 95 98 113* 91   Lipid Profile: No results for input(s): CHOL, HDL, LDLCALC, TRIG, CHOLHDL, LDLDIRECT in the last 72 hours. Thyroid Function Tests: No results for input(s): TSH, T4TOTAL, FREET4, T3FREE, THYROIDAB in the last 72 hours. Anemia Panel: No results for input(s): VITAMINB12, FOLATE, FERRITIN, TIBC, IRON, RETICCTPCT in the last 72 hours. Urine analysis: No results found for: COLORURINE, APPEARANCEUR, LABSPEC, PHURINE, GLUCOSEU, HGBUR, BILIRUBINUR, KETONESUR, PROTEINUR, UROBILINOGEN, NITRITE, LEUKOCYTESUR Sepsis Labs: Invalid input(s): PROCALCITONIN, LACTICIDVEN  Recent Results (from the past 240 hour(s))  Culture, blood (routine x 2)     Status: None   Collection Time: 10/12/15  5:25 PM  Result Value Ref Range Status   Specimen Description BLOOD HEMODIALYSIS CATHETER  Final   Special Requests BOTTLES DRAWN AEROBIC AND ANAEROBIC 10CC  Final   Culture NO GROWTH 5 DAYS  Final   Report Status 10/17/2015 FINAL  Final  Culture, blood (routine x 2)     Status: None   Collection Time: 10/12/15  5:25 PM  Result Value Ref Range Status   Specimen Description BLOOD  HEMODIALYSIS CATHETER  Final   Special Requests BOTTLES DRAWN AEROBIC AND ANAEROBIC 10CC  Final   Culture NO GROWTH 5 DAYS  Final   Report Status 10/17/2015 FINAL  Final      Radiology Studies: No results found.   Scheduled Meds: . amLODipine  10 mg Oral QHS  . aspirin EC  81 mg Oral Daily  . atorvastatin  40 mg Oral Daily  . darbepoetin (ARANESP) injection - DIALYSIS  100 mcg Intravenous Q Wed-HD  . doxercalciferol  1 mcg Intravenous Q M,W,F-HD  . febuxostat  40 mg Oral Daily  . ferric gluconate (FERRLECIT/NULECIT) IV  125 mg Intravenous Q M,W,F-HD  . heparin subcutaneous  5,000 Units Subcutaneous Q8H  . hydrocortisone   Rectal BID  . lactulose  20 g Oral BID  . metoprolol succinate  50 mg Oral BID  . multivitamin with minerals  1 tablet Oral Daily  . neomycin-bacitracin-polymyxin   Topical Daily  . senna-docusate  2 tablet Oral BID  . sevelamer carbonate  2,400 mg Oral TID WC  . sodium chloride flush  3 mL Intravenous Q12H   Continuous Infusions:    Marzetta Board, MD, PhD [facility] Pager [phone]  If 7PM-7AM, please contact night-coverage [email or web address] Password [facility] 10/22/2015, 11:52 AM

## 2015-10-22 NOTE — Procedures (Signed)
Patient seen on Hemodialysis. QB 300, UF goal 4.5L Treatment adjusted as needed.  Elmarie Shiley MD [facility]. Office # [phone] Pager # [phone] 1:29 PM

## 2015-10-22 NOTE — Progress Notes (Signed)
Physical Therapy Treatment Patient Details Name: Kaleb Y Jamison MRN: [id number] DOB: 03/16/1956 Today's Date: 10/22/2015    History of Present Illness 60 year old female presenting with 10 days of progressive dyspnea. PMH is significant for CKD, HTN, T2DM, and seizure disorder    PT Comments    Ms. Neilan is very motivated to improve, and was able to walk in-room distance today with chair pushed behind for safety; still fatigues quickly, but was able to take a standing rest break and then continue walking  Follow Up Recommendations  SNF;Supervision/Assistance - 24 hour     Equipment Recommendations  Rolling walker with 5" wheels;3in1 (PT) (bariatric)    Recommendations for Other Services       Precautions / Restrictions Precautions Precautions: Fall    Mobility  Bed Mobility Overal bed mobility: Needs Assistance Bed Mobility: Rolling;Sidelying to Sit Rolling: Min assist (with rail) Sidelying to sit: Mod assist;+2 for physical assistance       General bed mobility comments: Mod assist to elevate trunk and ease knees and feet off of bed; Noting smoother, more efficient motion  Transfers Overall transfer level: Needs assistance Equipment used: Rolling walker (2 wheeled) Transfers: Sit to/from Stand Sit to Stand: Mod assist;+2 physical assistance         General transfer comment: Mod assist to power up; most effective way to boost was using teh bed pad; somewhat dependent on momentum  Ambulation/Gait Ambulation/Gait assistance: Min assist;+2 safety/equipment Ambulation Distance (Feet): 18 Feet Assistive device: Rolling walker (2 wheeled) Gait Pattern/deviations: Step-through pattern;Decreased step length - right;Decreased step length - left;Decreased stride length;Trunk flexed (tending to rest forearms on RW)     General Gait Details: Fatigued quickly and flexed posture; one standing rest break; able to stand fully upright, but tending to walk with forearms resting  on RW   Stairs            Wheelchair Mobility    Modified Rankin (Stroke Patients Only)       Balance     Sitting balance-Leahy Scale: Fair       Standing balance-Leahy Scale: Poor                      Cognition Arousal/Alertness: Awake/alert Behavior During Therapy: WFL for tasks assessed/performed Overall Cognitive Status: Within Functional Limits for tasks assessed                      Exercises Other Exercises Other Exercises: Alternating Heel Slides in supine x 20    General Comments General comments (skin integrity, edema, etc.): Reciprocal scooting continues to be a challenge for Ms. Vento; putting down the footrests so she could push against them helped      Pertinent Vitals/Pain Pain Assessment: No/denies pain    Home Living                      Prior Function            PT Goals (current goals can now be found in the care plan section) Acute Rehab PT Goals Patient Stated Goal: be able to return home PT Goal Formulation: With patient Time For Goal Achievement: 11/05/15 Potential to Achieve Goals: Good Progress towards PT goals: Goals met and updated - see care plan    Frequency  Min 2X/week    PT Plan Current plan remains appropriate    Co-evaluation               End of Session Equipment Utilized During Treatment: Gait belt Activity Tolerance: Patient tolerated treatment well Patient left: in chair;with call bell/phone within reach     Time: 1026-1053 PT Time Calculation (min) (ACUTE ONLY): 27 min  Charges:  $Gait Training: 8-22 mins $Therapeutic Activity: 8-22 mins                    G Codes:      Quin Hoop 10/22/2015, 12:54 PM  Roney Marion, [place] Pager [phone] Office [phone]

## 2015-10-22 NOTE — Progress Notes (Signed)
Pt. Placed cpap on herself. No issues at this time.

## 2015-10-23 LAB — CBC
HCT: 30.1 % — ABNORMAL LOW (ref 36.0–46.0)
HEMOGLOBIN: 9.2 g/dL — AB (ref 12.0–15.0)
MCH: 26.7 pg (ref 26.0–34.0)
MCHC: 30.6 g/dL (ref 30.0–36.0)
MCV: 87.5 fL (ref 78.0–100.0)
PLATELETS: 273 10*3/uL (ref 150–400)
RBC: 3.44 MIL/uL — AB (ref 3.87–5.11)
RDW: 16.2 % — ABNORMAL HIGH (ref 11.5–15.5)
WBC: 7.3 10*3/uL (ref 4.0–10.5)

## 2015-10-23 LAB — RENAL FUNCTION PANEL
ANION GAP: 11 (ref 5–15)
Albumin: 2.2 g/dL — ABNORMAL LOW (ref 3.5–5.0)
BUN: 45 mg/dL — AB (ref 6–20)
CHLORIDE: 98 mmol/L — AB (ref 101–111)
CO2: 27 mmol/L (ref 22–32)
Calcium: 9 mg/dL (ref 8.9–10.3)
Creatinine, Ser: 8.52 mg/dL — ABNORMAL HIGH (ref 0.44–1.00)
GFR calc Af Amer: 5 mL/min — ABNORMAL LOW (ref >60)
GFR, EST NON AFRICAN AMERICAN: 5 mL/min — AB (ref >60)
Glucose, Bld: 101 mg/dL — ABNORMAL HIGH (ref 65–99)
PHOSPHORUS: 3.6 mg/dL (ref 2.5–4.6)
POTASSIUM: 4.7 mmol/L (ref 3.5–5.1)
Sodium: 136 mmol/L (ref 135–145)

## 2015-10-23 LAB — GLUCOSE, CAPILLARY
GLUCOSE-CAPILLARY: 93 mg/dL (ref 65–99)
Glucose-Capillary: 103 mg/dL — ABNORMAL HIGH (ref 65–99)

## 2015-10-23 NOTE — Progress Notes (Signed)
Patient ID: [name], female   DOB: 1956/03/24, 60 y.o.   MRN: [id number]  [facility] Progress Note   Assessment/ Plan:   1. Acute hypoxic respiratory failure secondary to pulmonary edema/diastolic CHF: Improved with hemodialysis/ultrafiltration. Efforts at physical therapy/mobilization as this will likely be her biggest barrier to outpatient dialysis unit placement. 2.ESRD: To undergo hemodialysis today via IJ tunneled hemodialysis catheter. Recent infiltration of left brachiocephalic fistula that is likely too deep and would require superficialization. The process for outpatient dialysis unit placement has been started however, this is likely to be affected by her deconditioning and impaired mobility given the limited assistance that she can be given at the unit. She will need to demonstrate the ability to ambulate and engage in independent transfers. Hemodialysis incomplete yesterday because of difficulties with her dialysis catheter-retry today status post overnight tPA dwell. 3. Anemia: Without overt loss, hemoglobin level is acceptable current dose of Aranesp. 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 Nutrition: With morbid obesity but hypoalbuminemia from progressive CKD/inflammatory component of TDC 6. Hypertension: Blood pressures acceptable-anticipate to improve further with ultrafiltration and hemodialysis.  Subjective:   Reports to be feeling fair-denies any chest pain or shortness of breath. She voices concern about her ability to move around/physical therapy.    Objective:   BP 130/68 mmHg  Pulse 79  Temp(Src) 97.7 F (36.5 C) (Oral)  Resp 18  Ht 5\' 6"  (1.676 m)  Wt 168 kg (370 lb 6 oz)  BMI 59.81 kg/m2  SpO2 97%  Physical Exam: [id number] resting in bed watching television CVS: Pulse regular rhythm, S1 and S2 normal Resp: Decreased breath sounds over bases, no rales/rhonchi Abd: Soft,  obese, nontender Ext: Chronic lymphedema with some pitting edema  Labs: BMET  Recent Labs Lab 10/17/15 0511 10/18/15 0618 10/19/15 0529 10/20/15 0601 10/21/15 0556 10/22/15 0416 10/23/15 0524  NA 137  137 139 140 138 136 135 136  K 4.5  4.4 4.1 4.0 4.4 4.4 5.0 4.7  CL 96*  96* 98* 98* 95* 95* 98* 98*  CO2 27  27 28 29 30 29 26 27   GLUCOSE 74  73 92 75 90 87 123* 101*  BUN 36*  37* 21* 32* 21* 32* 43* 45*  CREATININE 7.42*  7.33* 5.32* 7.34* 5.25* 7.10* 8.57* 8.52*  CALCIUM 8.6*  8.6* 8.6* 9.0 9.2 9.2 8.9 9.0  PHOS 4.7* 3.6 3.8 3.2 3.4 3.2 3.6   CBC  Recent Labs Lab 10/17/15 0511 10/19/15 0800 10/20/15 0601 10/22/15 1330  WBC 12.4* 8.7 9.4 8.1  NEUTROABS 9.4*  --   --   --   HGB 9.3* 9.3* 10.4* 10.5*  HCT 30.0* 31.0* 34.8* 33.3*  MCV 87.7 89.9 90.2 87.6  PLT 269 314 283 308   Medications:    . alteplase  2 mg Intracatheter Once  . amLODipine  10 mg Oral QHS  . aspirin EC  81 mg Oral Daily  . atorvastatin  40 mg Oral Daily  . darbepoetin (ARANESP) injection - DIALYSIS  100 mcg Intravenous Q Wed-HD  . doxercalciferol  1 mcg Intravenous Q M,W,F-HD  . febuxostat  40 mg Oral Daily  . ferric gluconate (FERRLECIT/NULECIT) IV  125 mg Intravenous Q M,W,F-HD  . heparin subcutaneous  5,000 Units Subcutaneous Q8H  . hydrocortisone   Rectal BID  . lactulose  20 g Oral BID  . metoprolol succinate  50 mg Oral BID  . multivitamin with minerals  1 tablet Oral Daily  . neomycin-bacitracin-polymyxin   Topical Daily  . senna-docusate  2 tablet Oral BID  . sevelamer carbonate  2,400 mg Oral TID WC  . sodium chloride flush  3 mL Intravenous Q12H    Elmarie Shiley, MD 10/23/2015, 11:17 AM

## 2015-10-23 NOTE — Care Management Important Message (Signed)
Important Message  Patient Details  Name: [name] MRN: [id number] Date of Birth: 1956/02/28   Medicare Important Message Given:  Yes    [name], [place]oy [place] 10/23/2015, 2:23 PM

## 2015-10-23 NOTE — Progress Notes (Signed)
PROGRESS NOTE  [name] [id number] DOB: 07-28-1955 DOA: 10/04/2015 PCP: Dwan Bolt, MD   LOS: 18 days   Brief Narrative: Patient was admitted on 10/04/2015, with complaint of shortness of breath progressively worsening without any chest pain or cough or fever or chills, was found to have acute on chronic diastolic dysfunction with elevated troponin and elevated creatinine. Patient also had hyperkalemia with metabolic acidosis. Patient was treated conservatively with Lasix in the ER but later on transferred to [facility] on admission. Initial AV fistula hemodialysis was not successful and temporary hemodialysis catheter was placed. As per vascular surgery the AV fistula will be superficialized. Currently further plan is continue hemodialysis and arrange for outpatient discharge HD. Limiting factor in why the patient is still hospitalized is the fact that she is unable to undergo hemodialysis sitting in a chair. Her medical problems under control.  Assessment & Plan: Principal Problem:   Acute pulmonary edema (HCC) Active Problems:   Morbid obesity ([place])   Renal failure (ARF), acute on chronic (HCC)   Anemia in chronic kidney disease   Type 2 diabetes mellitus with renal complication (HCC)   Hypertension   Elevated troponin   Hypoglycemia   Occult blood positive stool   Chronic anemia  Acute hypoxic respiratory failure due to Acute pulmonary edema (HCC) and Suspected acute on chronic diastolic dysfunction. - Oxygenation is significantly improving with fluid removal, now fluid management per HD - Left arm fistula placed in 07/25/2015. Unable to use at present due to being too deep - Tunneled HD catheter on May 9. - Hemodialysis started on May 5 with temporary catheter and will need lifelong hemodialysis an outpatient HD clip - AV fistula will require future procedures later and patient will need outpatient follow-up with vascular surgery. - Patient will need to be  able to tolerate hemodialysis in chair in an outpatient setting which she is unable to perform at present and therefore discharge was held up.  - overall weight since admission 412 >> ~360s  Generalized fatigue and weakness with physical deconditioning. - Likely due to morbid obesity and prolonged hospital stay - TSH & cortisol level unremarkable. - Hemodynamically stable. Blood cultures no growth to date. - H&H improved after transfusion, improvement in weakness after transfusion. - Patient will need significant physical therapy. - As per CIR not a candidate for CIR and will need SNF.  Elevated troponin. - Troponin was 11 on May 6 - Echocardiogram does not show any acute wall motion abnormality EKG does not show any acute ischemic changes. - Currently on aspirin and statin and beta blocker. - Cardiology was consulted and appreciate input, medical management for now  Acute encephalopathy. - Currently resolving. - Avoid sedating medications.  Essential hypertension. - Blood pressure getting stable after hemodialysis initiation. - Continue amlodipine and metoprolol.  Anemia of chronic kidney disease. - Iron studies, folic acid, TSH within normal limits. - B-12 elevated. - No active bleeding. - Transfused 1 unit on May of 9, due to significant weakness and monitor clinically. - Monitor serial H&H.  Obesity hypoventilation syndrome. - Resume home CPap.  Constipation >> diarrhea - Currently resolved, Miralax / Senokot discontinued at patient's request since she is having diarrhea - diarrhea resolved - Senokot for constipation. Add lactulose. Also Enema. Good results last night  DVT prophylaxis: heparin Code Status: Full code Family Communication: no family bedside Disposition Plan: SNF when ready  Consultants:   Nephrology   Vascular surgery  Cardiology   Procedures:  2D echo  Antimicrobials:  None    Subjective: - She had a bowel movement last night, her  abdomen feels a little bit uncomfortable this morning but no longer in pain   Objective: Filed Vitals:   10/22/15 1711 10/22/15 2018 10/23/15 0422 10/23/15 0900  BP: 132/70 134/76 127/78 130/68  Pulse:  84 79 79  Temp: 98.4 F (36.9 C) 98.6 F (37 C) 97.8 F (36.6 C) 97.7 F (36.5 C)  TempSrc: Oral Oral Oral Oral  Resp: 18 19 16 18   Height:      Weight:  168 kg (370 lb 6 oz)    SpO2: 97% 96% 97% 97%    Intake/Output Summary (Last 24 hours) at 10/23/15 1137 Last data filed at 10/23/15 0830  Gross per 24 hour  Intake    240 ml  Output      0 ml  Net    240 ml   Filed Weights   10/21/15 0537 10/22/15 0447 10/22/15 2018  Weight: 164 kg (361 lb 8.9 oz) 165 kg (363 lb 12.1 oz) 168 kg (370 lb 6 oz)    Examination: Constitutional: NAD Filed Vitals:   10/22/15 1711 10/22/15 2018 10/23/15 0422 10/23/15 0900  BP: 132/70 134/76 127/78 130/68  Pulse:  84 79 79  Temp: 98.4 F (36.9 C) 98.6 F (37 C) 97.8 F (36.6 C) 97.7 F (36.5 C)  TempSrc: Oral Oral Oral Oral  Resp: 18 19 16 18   Height:      Weight:  168 kg (370 lb 6 oz)    SpO2: 97% 96% 97% 97%   Respiratory: clear to auscultation bilaterally, no wheezing, no crackles.  Cardiovascular: Regular rate and rhythm, no murmurs / rubs / gallops. trace LE edema. 2+ pedal pulses.  Abdomen: no tenderness. Bowel sounds positive.    Data Reviewed: I have personally reviewed following labs and imaging studies  CBC:  Recent Labs Lab 10/17/15 0511 10/19/15 0800 10/20/15 0601 10/22/15 1330  WBC 12.4* 8.7 9.4 8.1  NEUTROABS 9.4*  --   --   --   HGB 9.3* 9.3* 10.4* 10.5*  HCT 30.0* 31.0* 34.8* 33.3*  MCV 87.7 89.9 90.2 87.6  PLT 269 314 283 [id number]   Basic Metabolic Panel:  Recent Labs Lab 10/19/15 0529 10/20/15 0601 10/21/15 0556 10/22/15 0416 10/23/15 0524  NA 140 138 136 135 136  K 4.0 4.4 4.4 5.0 4.7  CL 98* 95* 95* 98* 98*  CO2 29 30 29 26 27   GLUCOSE 75 90 87 123* 101*  BUN 32* 21* 32* 43* 45*  CREATININE  7.34* 5.25* 7.10* 8.57* 8.52*  CALCIUM 9.0 9.2 9.2 8.9 9.0  PHOS 3.8 3.2 3.4 3.2 3.6   GFR: Estimated Creatinine Clearance: 11.7 mL/min (by C-G formula based on Cr of 8.52). Liver Function Tests:  Recent Labs Lab 10/17/15 0511  10/19/15 0529 10/20/15 0601 10/21/15 0556 10/22/15 0416 10/23/15 0524  AST 23  --   --   --   --   --   --   ALT 28  --   --   --   --   --   --   ALKPHOS 117  --   --   --   --   --   --   BILITOT 0.4  --   --   --   --   --   --   PROT 6.4*  --   --   --   --   --   --  ALBUMIN 1.8*  1.8*  < > 2.0* 2.1* 2.1* 2.2* 2.2*  < > = values in this interval not displayed. No results for input(s): LIPASE, AMYLASE in the last 168 hours. No results for input(s): AMMONIA in the last 168 hours. Coagulation Profile: No results for input(s): INR, PROTIME in the last 168 hours. Cardiac Enzymes: No results for input(s): CKTOTAL, CKMB, CKMBINDEX, TROPONINI in the last 168 hours. BNP (last 3 results) No results for input(s): PROBNP in the last 8760 hours. HbA1C: No results for input(s): HGBA1C in the last 72 hours. CBG:  Recent Labs Lab 10/22/15 0756 10/22/15 1135 10/22/15 1621 10/22/15 2105 10/23/15 0834  GLUCAP 113* 91 112* 90 93   Lipid Profile: No results for input(s): CHOL, HDL, LDLCALC, TRIG, CHOLHDL, LDLDIRECT in the last 72 hours. Thyroid Function Tests: No results for input(s): TSH, T4TOTAL, FREET4, T3FREE, THYROIDAB in the last 72 hours. Anemia Panel: No results for input(s): VITAMINB12, FOLATE, FERRITIN, TIBC, IRON, RETICCTPCT in the last 72 hours. Urine analysis: No results found for: COLORURINE, APPEARANCEUR, LABSPEC, PHURINE, GLUCOSEU, HGBUR, BILIRUBINUR, KETONESUR, PROTEINUR, UROBILINOGEN, NITRITE, LEUKOCYTESUR Sepsis Labs: Invalid input(s): PROCALCITONIN, LACTICIDVEN  No results found for this or any previous visit (from the past 240 hour(s)).    Radiology Studies: No results found.   Scheduled Meds: . alteplase  2 mg  Intracatheter Once  . amLODipine  10 mg Oral QHS  . aspirin EC  81 mg Oral Daily  . atorvastatin  40 mg Oral Daily  . darbepoetin (ARANESP) injection - DIALYSIS  100 mcg Intravenous Q Wed-HD  . doxercalciferol  1 mcg Intravenous Q M,W,F-HD  . febuxostat  40 mg Oral Daily  . ferric gluconate (FERRLECIT/NULECIT) IV  125 mg Intravenous Q M,W,F-HD  . heparin subcutaneous  5,000 Units Subcutaneous Q8H  . hydrocortisone   Rectal BID  . lactulose  20 g Oral BID  . metoprolol succinate  50 mg Oral BID  . multivitamin with minerals  1 tablet Oral Daily  . neomycin-bacitracin-polymyxin   Topical Daily  . senna-docusate  2 tablet Oral BID  . sevelamer carbonate  2,400 mg Oral TID WC  . sodium chloride flush  3 mL Intravenous Q12H   Continuous Infusions:    Marzetta Board, MD, PhD [facility] Pager [phone]  If 7PM-7AM, please contact night-coverage [email or web address] Password [facility] 10/23/2015, 11:37 AM

## 2015-10-24 DIAGNOSIS — N186 End stage renal disease: Secondary | ICD-10-CM

## 2015-10-24 DIAGNOSIS — Z992 Dependence on renal dialysis: Secondary | ICD-10-CM

## 2015-10-24 LAB — CBC
HCT: 32 % — ABNORMAL LOW (ref 36.0–46.0)
Hemoglobin: 9.8 g/dL — ABNORMAL LOW (ref 12.0–15.0)
MCH: 27.1 pg (ref 26.0–34.0)
MCHC: 30.6 g/dL (ref 30.0–36.0)
MCV: 88.6 fL (ref 78.0–100.0)
PLATELETS: 261 10*3/uL (ref 150–400)
RBC: 3.61 MIL/uL — ABNORMAL LOW (ref 3.87–5.11)
RDW: 16.3 % — AB (ref 11.5–15.5)
WBC: 7 10*3/uL (ref 4.0–10.5)

## 2015-10-24 LAB — GLUCOSE, CAPILLARY
GLUCOSE-CAPILLARY: 112 mg/dL — AB (ref 65–99)
Glucose-Capillary: 74 mg/dL (ref 65–99)
Glucose-Capillary: 123 mg/dL — ABNORMAL HIGH (ref 65–99)
Glucose-Capillary: 91 mg/dL (ref 65–99)
Glucose-Capillary: 93 mg/dL (ref 65–99)

## 2015-10-24 LAB — BASIC METABOLIC PANEL
Anion gap: 10 (ref 5–15)
BUN: 18 mg/dL (ref 6–20)
CALCIUM: 9 mg/dL (ref 8.9–10.3)
CO2: 29 mmol/L (ref 22–32)
CREATININE: 5.18 mg/dL — AB (ref 0.44–1.00)
Chloride: 98 mmol/L — ABNORMAL LOW (ref 101–111)
GFR calc Af Amer: 10 mL/min — ABNORMAL LOW (ref >60)
GFR, EST NON AFRICAN AMERICAN: 8 mL/min — AB (ref >60)
GLUCOSE: 80 mg/dL (ref 65–99)
Potassium: 3.9 mmol/L (ref 3.5–5.1)
SODIUM: 137 mmol/L (ref 135–145)

## 2015-10-24 MED ORDER — POLYETHYLENE GLYCOL 3350 17 G PO PACK
17.0000 g | PACK | Freq: Every day | ORAL | Status: DC
  Administered 2015-10-24 – 2015-10-29 (×5): 17 g via ORAL
  Filled 2015-10-24 (×6): qty 1

## 2015-10-24 NOTE — Progress Notes (Addendum)
PROGRESS NOTE  [name] [id number] DOB: 27-Jan-1956 DOA: 10/04/2015 PCP: Dwan Bolt, MD  Brief History:  Patient was admitted on 10/04/2015, with complaint of shortness of breath progressively worsening without any chest pain or cough or fever or chills, was found to have acute on chronic diastolic dysfunction with elevated troponin and elevated creatinine. Patient also had hyperkalemia with metabolic acidosis. Patient was treated conservatively with Lasix in the ER but later on transferred to [facility] on admission. Initial AV fistula hemodialysis was not successful and temporary hemodialysis catheter was placed. As per vascular surgery the AV fistula will be superficialized. Currently further plan is continue hemodialysis and arrange for outpatient discharge HD. Limiting factor in why the patient is still hospitalized due to difficulty finding an outpt HD center able to accommodate patient due to her deconditioning and impaired mobility and limited assistance she can be given at the outpt unit.  She will need to demonstrate ability to engage in independent transfers.  Assessment/Plan: Acute hypoxic respiratory failure due to Acute pulmonary edema (HCC) and Suspected acute on chronic diastolic dysfunction. - Oxygenation is significantly improving with fluid removal, now fluid management per HD -now stable on RA  New ESRD - Left arm fistula placed in 07/25/2015. Unable to use at present due to being too deep - Tunneled HD catheter on 10/09/15. - Hemodialysis started on 5/5//17 via [facility] - AV fistula will require future procedures superficialization later and patient will need outpatient follow-up with vascular surgery. - Patient will need to be able to tolerate hemodialysis in chair in an outpatient setting and demonstrate independent transfers - overall weight since admission 412 >> ~360s  Generalized fatigue and weakness with physical deconditioning. - Likely  due to morbid obesity and prolonged hospital stay - TSH & cortisol level unremarkable. - Hemodynamically stable. Blood cultures no growth to date. - H&H improved after transfusion, improvement in weakness after transfusion. - Patient will need significant physical therapy. - As per CIR not a candidate for CIR and will need SNF.  Elevated troponin. - Troponin was 11 on May 6 - 10/07/15 Echo-- EF-- 55-60%, no WMA; EKG does not show any acute ischemic changes. - Currently on aspirin and statin and beta blocker. - Cardiology was consulted and appreciate input, medical management for now  Acute encephalopathy. - resolved - Avoid sedating medications.  Essential hypertension. - Blood pressure getting stable after hemodialysis initiation. - Continue amlodipine and metoprolol.  Anemia of chronic kidney disease. - TSH within normal limits. -Iron saturation 26%, ferritin 47, folic acid 123456 - 0000000 elevated. - No active bleeding. - Transfused 1 unit on May of 9, due to significant weakness and monitor clinically. - Monitor serial H&H--remains largely stable in the 9-10 range  Obesity hypoventilation syndrome. - Resume home CPAP.  Constipation >> diarrhea - Currently resolved, Miralax / Senokot discontinued at patient's request since she was having diarrhea - diarrhea resolved, then developed constipation - Senokot for constipation. Add lactulose. Also Enema. Good results 5/23  Impaired glucose tolerance -HbA1c--6.2   Disposition Plan:   SNF when accepted by HD center Family Communication: no  Family at beside  Consultants:  Cardiology, Renal, vascular surgery  Code Status:  DULL   Subjective: Patient denies fevers, chills, headache, chest pain, dyspnea, nausea, vomiting, diarrhea, abdominal pain, dysuria, hematuria. Tolerating diet   Objective: Filed Vitals:   10/23/15 1903 10/23/15 2126 10/24/15 0509 10/24/15 0801  BP: 114/64 110/57 116/71 119/71  Pulse: 89 93 95 87    Temp: 98.9 F (37.2 C) 97.8 F (36.6 C) 98.2 F (36.8 C) 98.2 F (36.8 C)  TempSrc: Oral Oral Oral Oral  Resp: 16 18 20 18   Height:      Weight:  163 kg (359 lb 5.6 oz)    SpO2: 99% 97% 95% 97%    Intake/Output Summary (Last 24 hours) at 10/24/15 1657 Last data filed at 10/24/15 1348  Gross per 24 hour  Intake    600 ml  Output   2915 ml  Net  -2315 ml   Weight change: -2 kg (-4 lb 6.5 oz) Exam:   General:  Pt is alert, follows commands appropriately, not in acute distress  HEENT: No icterus, No thrush, No neck mass, [place]/AT  Cardiovascular: RRR, S1/S2, no rubs, no gallops  Respiratory: CTA bilaterally, no wheezing, no crackles, no rhonchi  Abdomen: Soft/+BS, non tender, non distended, no guarding  Extremities: 1 + LE edema, No lymphangitis, No petechiae, No rashes, no synovitis   Data Reviewed: I have personally reviewed following labs and imaging studies Basic Metabolic Panel:  Recent Labs Lab 10/19/15 0529 10/20/15 0601 10/21/15 0556 10/22/15 0416 10/23/15 0524 10/24/15 0610  NA 140 138 136 135 136 137  K 4.0 4.4 4.4 5.0 4.7 3.9  CL 98* 95* 95* 98* 98* 98*  CO2 29 30 29 26 27 29   GLUCOSE 75 90 87 123* 101* 80  BUN 32* 21* 32* 43* 45* 18  CREATININE 7.34* 5.25* 7.10* 8.57* 8.52* 5.18*  CALCIUM 9.0 9.2 9.2 8.9 9.0 9.0  PHOS 3.8 3.2 3.4 3.2 3.6  --    Liver Function Tests:  Recent Labs Lab 10/19/15 0529 10/20/15 0601 10/21/15 0556 10/22/15 0416 10/23/15 0524  ALBUMIN 2.0* 2.1* 2.1* 2.2* 2.2*   No results for input(s): LIPASE, AMYLASE in the last 168 hours. No results for input(s): AMMONIA in the last 168 hours. Coagulation Profile: No results for input(s): INR, PROTIME in the last 168 hours. CBC:  Recent Labs Lab 10/19/15 0800 10/20/15 0601 10/22/15 1330 10/23/15 1515 10/24/15 0610  WBC 8.7 9.4 8.1 7.3 7.0  HGB 9.3* 10.4* 10.5* 9.2* 9.8*  HCT 31.0* 34.8* 33.3* 30.1* 32.0*  MCV 89.9 90.2 87.6 87.5 88.6  PLT 314 283 308 273 261    Cardiac Enzymes: No results for input(s): CKTOTAL, CKMB, CKMBINDEX, TROPONINI in the last 168 hours. BNP: Invalid input(s): POCBNP CBG:  Recent Labs Lab 10/23/15 1211 10/23/15 2124 10/24/15 0759 10/24/15 1146 10/24/15 1647  GLUCAP 103* 112* 74 123* 91   HbA1C: No results for input(s): HGBA1C in the last 72 hours. Urine analysis: No results found for: COLORURINE, APPEARANCEUR, LABSPEC, PHURINE, GLUCOSEU, HGBUR, BILIRUBINUR, KETONESUR, PROTEINUR, UROBILINOGEN, NITRITE, LEUKOCYTESUR Sepsis Labs: @LABRCNTIP (procalcitonin:4,lacticidven:4) )No results found for this or any previous visit (from the past 240 hour(s)).   Scheduled Meds: . alteplase  2 mg Intracatheter Once  . amLODipine  10 mg Oral QHS  . aspirin EC  81 mg Oral Daily  . atorvastatin  40 mg Oral Daily  . darbepoetin (ARANESP) injection - DIALYSIS  100 mcg Intravenous Q Wed-HD  . doxercalciferol  1 mcg Intravenous Q M,W,F-HD  . febuxostat  40 mg Oral Daily  . ferric gluconate (FERRLECIT/NULECIT) IV  125 mg Intravenous Q M,W,F-HD  . heparin subcutaneous  5,000 Units Subcutaneous Q8H  . hydrocortisone   Rectal BID  . metoprolol succinate  50 mg Oral BID  . multivitamin with minerals  1 tablet Oral Daily  .  neomycin-bacitracin-polymyxin   Topical Daily  . polyethylene glycol  17 g Oral Daily  . senna-docusate  2 tablet Oral BID  . sevelamer carbonate  2,400 mg Oral TID WC  . sodium chloride flush  3 mL Intravenous Q12H   Continuous Infusions:   Procedures/Studies: Dg Chest 2 View  10/13/2015  CLINICAL DATA:  60 year old female with shortness of breath. EXAM: CHEST  2 VIEW COMPARISON:  Chest x-ray 10/09/2015. FINDINGS: Right internal jugular Vas-Cath with tip terminating in the mid to distal superior vena cava. Lung volumes are slightly low. There is cephalization of the pulmonary vasculature and slight indistinctness of the interstitial markings suggestive of mild pulmonary edema. No pleural effusions. Mild  cardiomegaly. Upper mediastinal contours are within normal limits allowing for patient rotation to the right and lordotic positioning. IMPRESSION: 1. The appearance of the chest suggests mild congestive heart failure, as above. Electronically Signed   By: Vinnie Langton M.D.   On: 10/13/2015 13:40   Dg Chest Port 1 View  10/09/2015  CLINICAL DATA:  Dialysis catheter insertion EXAM: PORTABLE CHEST 1 VIEW COMPARISON:  10/08/2015 FINDINGS: Right IJ dialysis catheter tips SVC RA junction. No pneumothorax or effusion. Stable cardiomegaly and central vascular congestion. No focal pneumonia, collapse or consolidation. Degenerative changes of the spine. Stable hilar prominence as before. IMPRESSION: Right IJ dialysis catheter tips SVC RA junction. Stable cardiomegaly and central vascular congestion No complicating feature. Electronically Signed   By: Jerilynn Mages.  Shick M.D.   On: 10/09/2015 16:25   Dg Chest Port 1 View  10/08/2015  CLINICAL DATA:  Cough EXAM: PORTABLE CHEST 1 VIEW COMPARISON:  10/05/2015 FINDINGS: Cardiomegaly again noted. Central mild vascular congestion without convincing pulmonary edema. Mild left basilar atelectasis. Right IJ central line is unchanged in position. IMPRESSION: Central mild vascular congestion without convincing pulmonary edema. Mild left basilar atelectasis. Cardiomegaly again noted. Electronically Signed   By: Lahoma Crocker M.D.   On: 10/08/2015 08:16   Dg Chest Port 1 View  10/05/2015  CLINICAL DATA:  Central line placement. EXAM: PORTABLE CHEST 1 VIEW COMPARISON:  10/04/2015 FINDINGS: Right central line placement with the tip in the SVC. No pneumothorax. Heart is mildly enlarged. Mild vascular congestion. Mild interstitial prominence similar prior study. No acute bony abnormality. IMPRESSION: Right central line tip in the SVC. No pneumothorax. Otherwise no real change. Electronically Signed   By: Rolm Baptise M.D.   On: 10/05/2015 12:17   Dg Chest Port 1 View  10/04/2015  CLINICAL DATA:   Chest pain, shortness of breath, and cough for 1 week, worse tonight. EXAM: PORTABLE CHEST 1 VIEW COMPARISON:  09/28/2015 FINDINGS: Shallow inspiration. Cardiac enlargement with pulmonary vascular congestion. Mild interstitial pattern suggesting early interstitial edema. Similar appearance to previous study. No blunting of costophrenic angles. No pneumothorax. Mediastinal contours appear intact. IMPRESSION: Cardiac enlargement with pulmonary vascular congestion and mild interstitial edema similar to prior study. Electronically Signed   By: Lucienne Capers M.D.   On: 10/04/2015 23:29   Dg Abd Portable 1v  10/15/2015  CLINICAL DATA:  Abdominal pain. EXAM: PORTABLE ABDOMEN - 1 VIEW COMPARISON:  10/14/2015 FINDINGS: Examination is limited due to patient's body habitus. Visualized abdomen demonstrates gas and stool in the colon. No small or large bowel distention. Calcification in the right upper quadrant consistent with gallstone. Surgical clips in the right lower quadrant. No radiopaque stones identified. IMPRESSION: Nonobstructive bowel gas pattern with scattered gas and stool in the colon. Cholelithiasis. Electronically Signed   By: Oren Beckmann.D.  On: 10/15/2015 02:34   Dg Abd Portable 1v  10/14/2015  CLINICAL DATA:  Constipation EXAM: PORTABLE ABDOMEN - 1 VIEW COMPARISON:  CT abdomen 05/02/2009 FINDINGS: Image quality limited by large patient size Distended stomach.  Mildly distended colon.  Small bowel nondilated. Right upper quadrant calcification compatible gallstone as noted on CT. IMPRESSION: Limited exam.  Mild colonic ileus. Electronically Signed   By: Franchot Gallo M.D.   On: 10/14/2015 13:47    Creta Dorame, DO  [facility] Pager [phone]  If 7PM-7AM, please contact night-coverage [email or web address] Password [facility] 10/24/2015, 4:57 PM   LOS: 19 days

## 2015-10-24 NOTE — Progress Notes (Signed)
PT Cancellation Note  Patient Details Name: [name] MRN: [id number] DOB: 12/04/1955   Cancelled Treatment:    Reason Eval/Treat Not Completed: Other (comment) (Pt states she needs to be changed before PT can see her ).  Will need to be hoyer lifted back to bed and nursing is tied up with other pts first.  Try to see later if time allows.   Ramond Dial 10/24/2015, 2:49 PM    Mee Hives, PT MS Acute Rehab Dept. Number: [place]

## 2015-10-24 NOTE — Progress Notes (Signed)
Patient ID: [name], female   DOB: 08-05-1955, 60 y.o.   MRN: [id number]  [facility] Progress Note   Assessment/ Plan:   1. Acute hypoxic respiratory failure secondary to pulmonary edema/diastolic CHF: Improved with hemodialysis/ultrafiltration. She does not need supplemental oxygen at this time. 2. ESRD: Hemodialysis will be ordered for tomorrow after disruption of schedule post by difficulties in using her dialysis catheter and a successful for treatment of dialysis yesterday. Recent infiltration of left brachiocephalic fistula that is deemed too deep and will need superficialization at some point. The process for outpatient dialysis unit placement has been started however, this is likely to be affected by her deconditioning and impaired mobility given the limited assistance that she can be given at the unit. She will need to demonstrate the ability to ambulate and engage in independent transfers.  3. Anemia: Without overt loss, hemoglobin level is acceptable current dose of Aranesp. 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 Nutrition: With morbid obesity but hypoalbuminemia from progressive CKD/inflammatory component of TDC 6. Hypertension: Blood pressures acceptable-anticipate to improve further with ultrafiltration and hemodialysis.  She will need continued support from physical therapy to demonstrate ability for transfers and mobility prior to except for an outpatient dialysis unit given the limited help available from staff at the center particularly with regards to bariatric care.  Subjective:   Complaints that stool softener may be contributing to intermittent abdominal pain that he has and inquires whether she can receive a different kind.    Objective:   BP 119/71 mmHg  Pulse 87  Temp(Src) 98.2 F (36.8 C) (Oral)  Resp 18  Ht 5\' 6"  (1.676 m)  Wt 163 kg (359 lb 5.6 oz)  BMI 58.03 kg/m2  SpO2  97%  Physical Exam: [id number] resting in Recliner watching television CVS: Pulse regular rhythm, S1 and S2 normal Resp: Decreased breath sounds over bases, no rales/rhonchi Abd: Soft, obese, nontender Ext: Chronic lymphedema with some pitting edema  Labs: BMET  Recent Labs Lab 10/18/15 0618 10/19/15 0529 10/20/15 0601 10/21/15 0556 10/22/15 0416 10/23/15 0524 10/24/15 0610  NA 139 140 138 136 135 136 137  K 4.1 4.0 4.4 4.4 5.0 4.7 3.9  CL 98* 98* 95* 95* 98* 98* 98*  CO2 28 29 30 29 26 27 29   GLUCOSE 92 75 90 87 123* 101* 80  BUN 21* 32* 21* 32* 43* 45* 18  CREATININE 5.32* 7.34* 5.25* 7.10* 8.57* 8.52* 5.18*  CALCIUM 8.6* 9.0 9.2 9.2 8.9 9.0 9.0  PHOS 3.6 3.8 3.2 3.4 3.2 3.6  --    CBC  Recent Labs Lab 10/20/15 0601 10/22/15 1330 10/23/15 1515 10/24/15 0610  WBC 9.4 8.1 7.3 7.0  HGB 10.4* 10.5* 9.2* 9.8*  HCT 34.8* 33.3* 30.1* 32.0*  MCV 90.2 87.6 87.5 88.6  PLT 283 308 273 261   Medications:    . alteplase  2 mg Intracatheter Once  . amLODipine  10 mg Oral QHS  . aspirin EC  81 mg Oral Daily  . atorvastatin  40 mg Oral Daily  . darbepoetin (ARANESP) injection - DIALYSIS  100 mcg Intravenous Q Wed-HD  . doxercalciferol  1 mcg Intravenous Q M,W,F-HD  . febuxostat  40 mg Oral Daily  . ferric gluconate (FERRLECIT/NULECIT) IV  125 mg Intravenous Q M,W,F-HD  . heparin subcutaneous  5,000 Units Subcutaneous Q8H  . hydrocortisone   Rectal BID  . lactulose  20 g Oral BID  .  metoprolol succinate  50 mg Oral BID  . multivitamin with minerals  1 tablet Oral Daily  . neomycin-bacitracin-polymyxin   Topical Daily  . senna-docusate  2 tablet Oral BID  . sevelamer carbonate  2,400 mg Oral TID WC  . sodium chloride flush  3 mL Intravenous Q12H    Elmarie Shiley, MD 10/24/2015, 11:37 AM

## 2015-10-25 DIAGNOSIS — J9601 Acute respiratory failure with hypoxia: Secondary | ICD-10-CM

## 2015-10-25 LAB — RENAL FUNCTION PANEL
ANION GAP: 10 (ref 5–15)
Albumin: 2.4 g/dL — ABNORMAL LOW (ref 3.5–5.0)
BUN: 27 mg/dL — ABNORMAL HIGH (ref 6–20)
CALCIUM: 9.2 mg/dL (ref 8.9–10.3)
CO2: 28 mmol/L (ref 22–32)
CREATININE: 7.12 mg/dL — AB (ref 0.44–1.00)
Chloride: 99 mmol/L — ABNORMAL LOW (ref 101–111)
GFR, EST AFRICAN AMERICAN: 7 mL/min — AB (ref >60)
GFR, EST NON AFRICAN AMERICAN: 6 mL/min — AB (ref >60)
Glucose, Bld: 80 mg/dL (ref 65–99)
PHOSPHORUS: 3.6 mg/dL (ref 2.5–4.6)
Potassium: 4.5 mmol/L (ref 3.5–5.1)
SODIUM: 137 mmol/L (ref 135–145)

## 2015-10-25 LAB — GLUCOSE, CAPILLARY
GLUCOSE-CAPILLARY: 108 mg/dL — AB (ref 65–99)
GLUCOSE-CAPILLARY: 89 mg/dL (ref 65–99)
GLUCOSE-CAPILLARY: 91 mg/dL (ref 65–99)
Glucose-Capillary: 99 mg/dL (ref 65–99)

## 2015-10-25 LAB — CBC
HCT: 32.9 % — ABNORMAL LOW (ref 36.0–46.0)
Hemoglobin: 10 g/dL — ABNORMAL LOW (ref 12.0–15.0)
MCH: 26.7 pg (ref 26.0–34.0)
MCHC: 30.4 g/dL (ref 30.0–36.0)
MCV: 87.7 fL (ref 78.0–100.0)
Platelets: 266 10*3/uL (ref 150–400)
RBC: 3.75 MIL/uL — AB (ref 3.87–5.11)
RDW: 16 % — ABNORMAL HIGH (ref 11.5–15.5)
WBC: 6.3 10*3/uL (ref 4.0–10.5)

## 2015-10-25 MED ORDER — DARBEPOETIN ALFA 100 MCG/0.5ML IJ SOSY
100.0000 ug | PREFILLED_SYRINGE | INTRAMUSCULAR | Status: DC
  Administered 2015-10-25 – 2015-11-01 (×2): 100 ug via INTRAVENOUS
  Filled 2015-10-25: qty 0.5

## 2015-10-25 MED ORDER — DARBEPOETIN ALFA 100 MCG/0.5ML IJ SOSY
PREFILLED_SYRINGE | INTRAMUSCULAR | Status: AC
  Filled 2015-10-25: qty 0.5

## 2015-10-25 MED ORDER — DOXERCALCIFEROL 4 MCG/2ML IV SOLN
1.0000 ug | INTRAVENOUS | Status: DC
  Administered 2015-10-25 – 2015-10-27 (×2): 1 ug via INTRAVENOUS
  Filled 2015-10-25: qty 2

## 2015-10-25 MED ORDER — SODIUM CHLORIDE 0.9 % IV SOLN
125.0000 mg | INTRAVENOUS | Status: AC
  Administered 2015-10-25 – 2015-10-27 (×2): 125 mg via INTRAVENOUS
  Filled 2015-10-25 (×3): qty 10

## 2015-10-25 MED ORDER — DOXERCALCIFEROL 4 MCG/2ML IV SOLN
INTRAVENOUS | Status: AC
  Filled 2015-10-25: qty 2

## 2015-10-25 NOTE — Clinical Social Work Note (Signed)
CSW continuing to follow patient's progress and will meet with patient to complete assessment for skilled nursing facility.  Gwendoline Judy Givens, MSW, LCSW Licensed Clinical Social Worker [place] [phone]

## 2015-10-25 NOTE — Progress Notes (Signed)
Patient ID: [name], female   DOB: 1955-11-28, 60 y.o.   MRN: [id number]  [facility] Progress Note   Assessment/ Plan:   1. Acute hypoxic respiratory failure secondary to pulmonary edema/diastolic CHF: Improved with hemodialysis/ultrafiltration. Continues to tolerate exertion without significant hypoxia. 2. ESRD: Hemodialysis today (and will continue on a TTS Schedule after disrupted earlier in the week). Recent infiltration of left brachiocephalic fistula deemed too deep and will need superficialization. The process for outpatient dialysis unit placement has been started however, this is likely to be affected by her deconditioning and impaired mobility given the limited assistance that she can be given at the unit. She will need to demonstrate the ability to ambulate and engage in independent transfers.  3. Anemia: Without overt loss, hemoglobin level is acceptable current dose of Aranesp. 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 Nutrition: With morbid obesity but hypoalbuminemia from progressive CKD/inflammatory component of TDC 6. Hypertension: Blood pressures acceptable-anticipate to improve further with ultrafiltration and hemodialysis.  She will need continued support from physical therapy to demonstrate ability for transfers and mobility prior to except for an outpatient dialysis unit given the limited help available from staff at the center particularly with regards to bariatric care.  Subjective:   Reports to be doing well and "working double-time with PT".   Objective:   BP 120/68 mmHg  Pulse 82  Temp(Src) 98.2 F (36.8 C) (Oral)  Resp 16  Ht 5\' 6"  (1.676 m)  Wt 162 kg (357 lb 2.3 oz)  BMI 57.67 kg/m2  SpO2 95%  Physical Exam: [id number] resting in bariatric recliner at HD CVS: Pulse regular rhythm, S1 and S2 normal Resp: Decreased breath sounds over bases, no rales/rhonchi Abd: Soft,  obese, nontender Ext: Chronic lymphedema with some pitting edema  Labs: BMET  Recent Labs Lab 10/19/15 0529 10/20/15 0601 10/21/15 0556 10/22/15 0416 10/23/15 0524 10/24/15 0610 10/25/15 0547  NA 140 138 136 135 136 137 137  K 4.0 4.4 4.4 5.0 4.7 3.9 4.5  CL 98* 95* 95* 98* 98* 98* 99*  CO2 29 30 29 26 27 29 28   GLUCOSE 75 90 87 123* 101* 80 80  BUN 32* 21* 32* 43* 45* 18 27*  CREATININE 7.34* 5.25* 7.10* 8.57* 8.52* 5.18* 7.12*  CALCIUM 9.0 9.2 9.2 8.9 9.0 9.0 9.2  PHOS 3.8 3.2 3.4 3.2 3.6  --  3.6   CBC  Recent Labs Lab 10/22/15 1330 10/23/15 1515 10/24/15 0610 10/25/15 0830  WBC 8.1 7.3 7.0 6.3  HGB 10.5* 9.2* 9.8* 10.0*  HCT 33.3* 30.1* 32.0* 32.9*  MCV 87.6 87.5 88.6 87.7  PLT 308 273 261 266   Medications:    . alteplase  2 mg Intracatheter Once  . amLODipine  10 mg Oral QHS  . aspirin EC  81 mg Oral Daily  . atorvastatin  40 mg Oral Daily  . darbepoetin (ARANESP) injection - DIALYSIS  100 mcg Intravenous Q Thu-HD  . doxercalciferol  1 mcg Intravenous Q T,Th,Sa-HD  . febuxostat  40 mg Oral Daily  . ferric gluconate (FERRLECIT/NULECIT) IV  125 mg Intravenous Q T,Th,Sa-HD  . heparin subcutaneous  5,000 Units Subcutaneous Q8H  . hydrocortisone   Rectal BID  . metoprolol succinate  50 mg Oral BID  . multivitamin with minerals  1 tablet Oral Daily  . neomycin-bacitracin-polymyxin   Topical Daily  . polyethylene glycol  17 g Oral Daily  . senna-docusate  2 tablet  Oral BID  . sevelamer carbonate  2,400 mg Oral TID WC  . sodium chloride flush  3 mL Intravenous Q12H   Elmarie Shiley, MD 10/25/2015, 11:21 AM

## 2015-10-25 NOTE — Progress Notes (Signed)
PROGRESS NOTE  [name] [id number] DOB: Nov 09, 1955 DOA: 10/04/2015 PCP: Dwan Bolt, MD Brief History:  Patient was admitted on 10/04/2015, with complaint of shortness of breath progressively worsening without any chest pain or cough or fever or chills, was found to have acute on chronic diastolic dysfunction with elevated troponin and elevated creatinine. Patient also had hyperkalemia with metabolic acidosis. Patient was treated conservatively with Lasix in the ER but later on transferred to [facility] on admission. Initial AV fistula hemodialysis was not successful and temporary hemodialysis catheter was placed. As per vascular surgery the AV fistula will be superficialized. Currently further plan is continue hemodialysis and arrange for outpatient discharge HD. Limiting factor in why the patient is still hospitalized due to difficulty finding an outpt HD center able to accommodate patient due to her deconditioning and impaired mobility and limited assistance she can be given at the outpt unit. She will need to demonstrate ability to engage in independent transfers.  Assessment/Plan: Acute hypoxic respiratory failure due to Acute pulmonary edema (HCC) and Suspected acute on chronic diastolic dysfunction. - Oxygenation is significantly improving with fluid removal, now fluid management per HD -now stable on RA  New ESRD - Left arm fistula placed in 07/25/2015. Unable to use at present due to being too deep - Tunneled HD catheter on 10/09/15. - Hemodialysis started on 5/5//17 via [facility] - AV fistula will require future procedures superficialization later and patient will need outpatient follow-up with vascular surgery. - Patient will need to be able to tolerate hemodialysis in chair in an outpatient setting and demonstrate independent transfers-->doing better with only stand by assist per RN - overall weight since admission 412 >>>352  Generalized fatigue and  weakness with physical deconditioning. - Likely due to morbid obesity and prolonged hospital stay - TSH & cortisol level unremarkable. - Hemodynamically stable. Blood cultures no growth to date. - H&H improved after transfusion, improvement in weakness after transfusion. - Patient will need significant physical therapy. - As per CIR not a candidate for CIR and will need SNF. -10/25/15--doing better with transfers--doing better now with only stand by assist per RN  Elevated troponin. - Troponin was 11 on May 6 - 10/07/15 Echo-- EF-- 55-60%, no WMA; EKG does not show any acute ischemic changes. - Currently on aspirin and statin and beta blocker. - Cardiology was consulted and appreciate input, medical management for now  Acute encephalopathy. - resolved - Avoid sedating medications.  Essential hypertension. - Blood pressure getting stable after hemodialysis initiation. - Continue amlodipine and metoprolol.  Anemia of chronic kidney disease. - TSH within normal limits. -Iron saturation 26%, ferritin 47, folic acid 123456 - 0000000 elevated. - No active bleeding. - Transfused 1 unit on May of 9, due to significant weakness and monitor clinically. - Monitor serial H&H--remains largely stable in the 9-10 range  Obesity hypoventilation syndrome. - Resume home CPAP.  Constipation >> diarrhea - Currently resolved, Miralax / Senokot discontinued at patient's request since she was having diarrhea - diarrhea resolved, then developed constipation - Senokot for constipation. Add lactulose. Also Enema. Good results 5/23  Impaired glucose tolerance -HbA1c--6.2   Disposition Plan: SNF when accepted by HD center Family Communication: no Family at beside  Consultants: Cardiology, Renal, vascular surgery  Code Status: FULL   Subjective: Patient denies fevers, chills, headache, chest pain, dyspnea, nausea, vomiting, diarrhea, abdominal pain, dysuria, hematuria   Objective: Filed  Vitals:   10/25/15 1100 10/25/15  1130 10/25/15 1157 10/25/15 1217  BP: 120/68 127/71 158/78 144/99  Pulse: 82 87 84 85  Temp:      TempSrc:      Resp: 16 13 15 16   Height:      Weight:    160 kg (352 lb 11.8 oz)  SpO2:        Intake/Output Summary (Last 24 hours) at 10/25/15 1619 Last data filed at 10/25/15 1500  Gross per 24 hour  Intake    720 ml  Output   2000 ml  Net  -1280 ml   Weight change: -4 kg (-8 lb 13.1 oz) Exam:   General:  Pt is alert, follows commands appropriately, not in acute distress  HEENT: No icterus, No thrush, No neck mass, [place]/AT  Cardiovascular: RRR, S1/S2, no rubs, no gallops  Respiratory: CTA bilaterally, no wheezing, no crackles, no rhonchi  Abdomen: Soft/+BS, non tender, non distended, no guarding  Extremities: 1+ LE edema, No lymphangitis, No petechiae, No rashes, no synovitis   Data Reviewed: I have personally reviewed following labs and imaging studies Basic Metabolic Panel:  Recent Labs Lab 10/20/15 0601 10/21/15 0556 10/22/15 0416 10/23/15 0524 10/24/15 0610 10/25/15 0547  NA 138 136 135 136 137 137  K 4.4 4.4 5.0 4.7 3.9 4.5  CL 95* 95* 98* 98* 98* 99*  CO2 30 29 26 27 29 28   GLUCOSE 90 87 123* 101* 80 80  BUN 21* 32* 43* 45* 18 27*  CREATININE 5.25* 7.10* 8.57* 8.52* 5.18* 7.12*  CALCIUM 9.2 9.2 8.9 9.0 9.0 9.2  PHOS 3.2 3.4 3.2 3.6  --  3.6   Liver Function Tests:  Recent Labs Lab 10/20/15 0601 10/21/15 0556 10/22/15 0416 10/23/15 0524 10/25/15 0547  ALBUMIN 2.1* 2.1* 2.2* 2.2* 2.4*   No results for input(s): LIPASE, AMYLASE in the last 168 hours. No results for input(s): AMMONIA in the last 168 hours. Coagulation Profile: No results for input(s): INR, PROTIME in the last 168 hours. CBC:  Recent Labs Lab 10/20/15 0601 10/22/15 1330 10/23/15 1515 10/24/15 0610 10/25/15 0830  WBC 9.4 8.1 7.3 7.0 6.3  HGB 10.4* 10.5* 9.2* 9.8* 10.0*  HCT 34.8* 33.3* 30.1* 32.0* 32.9*  MCV 90.2 87.6 87.5 88.6 87.7    PLT 283 308 273 261 266   Cardiac Enzymes: No results for input(s): CKTOTAL, CKMB, CKMBINDEX, TROPONINI in the last 168 hours. BNP: Invalid input(s): POCBNP CBG:  Recent Labs Lab 10/24/15 1146 10/24/15 1647 10/24/15 2005 10/25/15 0730 10/25/15 1236  GLUCAP 123* 91 93 99 108*   HbA1C: No results for input(s): HGBA1C in the last 72 hours. Urine analysis: No results found for: COLORURINE, APPEARANCEUR, LABSPEC, PHURINE, GLUCOSEU, HGBUR, BILIRUBINUR, KETONESUR, PROTEINUR, UROBILINOGEN, NITRITE, LEUKOCYTESUR Sepsis Labs: @LABRCNTIP (procalcitonin:4,lacticidven:4) )No results found for this or any previous visit (from the past 240 hour(s)).   Scheduled Meds: . alteplase  2 mg Intracatheter Once  . amLODipine  10 mg Oral QHS  . aspirin EC  81 mg Oral Daily  . atorvastatin  40 mg Oral Daily  . darbepoetin (ARANESP) injection - DIALYSIS  100 mcg Intravenous Q Thu-HD  . doxercalciferol  1 mcg Intravenous Q T,Th,Sa-HD  . febuxostat  40 mg Oral Daily  . ferric gluconate (FERRLECIT/NULECIT) IV  125 mg Intravenous Q T,Th,Sa-HD  . heparin subcutaneous  5,000 Units Subcutaneous Q8H  . hydrocortisone   Rectal BID  . metoprolol succinate  50 mg Oral BID  . multivitamin with minerals  1 tablet Oral Daily  . neomycin-bacitracin-polymyxin  Topical Daily  . polyethylene glycol  17 g Oral Daily  . senna-docusate  2 tablet Oral BID  . sevelamer carbonate  2,400 mg Oral TID WC  . sodium chloride flush  3 mL Intravenous Q12H   Continuous Infusions:   Procedures/Studies: Dg Chest 2 View  10/13/2015  CLINICAL DATA:  60 year old female with shortness of breath. EXAM: CHEST  2 VIEW COMPARISON:  Chest x-ray 10/09/2015. FINDINGS: Right internal jugular Vas-Cath with tip terminating in the mid to distal superior vena cava. Lung volumes are slightly low. There is cephalization of the pulmonary vasculature and slight indistinctness of the interstitial markings suggestive of mild pulmonary edema. No  pleural effusions. Mild cardiomegaly. Upper mediastinal contours are within normal limits allowing for patient rotation to the right and lordotic positioning. IMPRESSION: 1. The appearance of the chest suggests mild congestive heart failure, as above. Electronically Signed   By: Vinnie Langton M.D.   On: 10/13/2015 13:40   Dg Chest Port 1 View  10/09/2015  CLINICAL DATA:  Dialysis catheter insertion EXAM: PORTABLE CHEST 1 VIEW COMPARISON:  10/08/2015 FINDINGS: Right IJ dialysis catheter tips SVC RA junction. No pneumothorax or effusion. Stable cardiomegaly and central vascular congestion. No focal pneumonia, collapse or consolidation. Degenerative changes of the spine. Stable hilar prominence as before. IMPRESSION: Right IJ dialysis catheter tips SVC RA junction. Stable cardiomegaly and central vascular congestion No complicating feature. Electronically Signed   By: Jerilynn Mages.  Shick M.D.   On: 10/09/2015 16:25   Dg Chest Port 1 View  10/08/2015  CLINICAL DATA:  Cough EXAM: PORTABLE CHEST 1 VIEW COMPARISON:  10/05/2015 FINDINGS: Cardiomegaly again noted. Central mild vascular congestion without convincing pulmonary edema. Mild left basilar atelectasis. Right IJ central line is unchanged in position. IMPRESSION: Central mild vascular congestion without convincing pulmonary edema. Mild left basilar atelectasis. Cardiomegaly again noted. Electronically Signed   By: Lahoma Crocker M.D.   On: 10/08/2015 08:16   Dg Chest Port 1 View  10/05/2015  CLINICAL DATA:  Central line placement. EXAM: PORTABLE CHEST 1 VIEW COMPARISON:  10/04/2015 FINDINGS: Right central line placement with the tip in the SVC. No pneumothorax. Heart is mildly enlarged. Mild vascular congestion. Mild interstitial prominence similar prior study. No acute bony abnormality. IMPRESSION: Right central line tip in the SVC. No pneumothorax. Otherwise no real change. Electronically Signed   By: Rolm Baptise M.D.   On: 10/05/2015 12:17   Dg Chest Port 1  View  10/04/2015  CLINICAL DATA:  Chest pain, shortness of breath, and cough for 1 week, worse tonight. EXAM: PORTABLE CHEST 1 VIEW COMPARISON:  09/28/2015 FINDINGS: Shallow inspiration. Cardiac enlargement with pulmonary vascular congestion. Mild interstitial pattern suggesting early interstitial edema. Similar appearance to previous study. No blunting of costophrenic angles. No pneumothorax. Mediastinal contours appear intact. IMPRESSION: Cardiac enlargement with pulmonary vascular congestion and mild interstitial edema similar to prior study. Electronically Signed   By: Lucienne Capers M.D.   On: 10/04/2015 23:29   Dg Abd Portable 1v  10/15/2015  CLINICAL DATA:  Abdominal pain. EXAM: PORTABLE ABDOMEN - 1 VIEW COMPARISON:  10/14/2015 FINDINGS: Examination is limited due to patient's body habitus. Visualized abdomen demonstrates gas and stool in the colon. No small or large bowel distention. Calcification in the right upper quadrant consistent with gallstone. Surgical clips in the right lower quadrant. No radiopaque stones identified. IMPRESSION: Nonobstructive bowel gas pattern with scattered gas and stool in the colon. Cholelithiasis. Electronically Signed   By: Lucienne Capers M.D.   On:  10/15/2015 02:34   Dg Abd Portable 1v  10/14/2015  CLINICAL DATA:  Constipation EXAM: PORTABLE ABDOMEN - 1 VIEW COMPARISON:  CT abdomen 05/02/2009 FINDINGS: Image quality limited by large patient size Distended stomach.  Mildly distended colon.  Small bowel nondilated. Right upper quadrant calcification compatible gallstone as noted on CT. IMPRESSION: Limited exam.  Mild colonic ileus. Electronically Signed   By: Franchot Gallo M.D.   On: 10/14/2015 13:47    Mosetta Ferdinand, DO  [facility] Pager [phone]  If 7PM-7AM, please contact night-coverage [email or web address] Password [email or web address] 10/25/2015, 4:19 PM   LOS: 20 days

## 2015-10-25 NOTE — Progress Notes (Signed)
Pt transferred from bed to chair with 2 person assist with rolling walker. Pt tolerated very well.

## 2015-10-25 NOTE — Procedures (Signed)
Patient seen on Hemodialysis. QB 375, UF goal 3L Treatment adjusted as needed.  Elmarie Shiley MD [place]. Office # [phone] Pager # [phone] 11:20 AM

## 2015-10-25 NOTE — Progress Notes (Addendum)
Pt transferred back to bed from chair with 2 person stand-by assist and rolling walker. Pt tolerated well and request increased opportunities for standing transfers, as well as ambulation. Dorthey Sawyer, RN

## 2015-10-25 NOTE — Progress Notes (Signed)
PT Cancellation Note  Patient Details Name: FALLON BONADIO MRN: [id number] DOB: Aug 04, 1955   Cancelled Treatment:    Reason Eval/Treat Not Completed: Patient at procedure or test/unavailable.  Pt currently at HD.  Will f/u another time.     Calyssa Zobrist, Thornton Papas 10/25/2015, 11:19 AM

## 2015-10-26 ENCOUNTER — Inpatient Hospital Stay (HOSPITAL_COMMUNITY): Payer: Medicare Other

## 2015-10-26 LAB — GLUCOSE, CAPILLARY
GLUCOSE-CAPILLARY: 99 mg/dL (ref 65–99)
GLUCOSE-CAPILLARY: 130 mg/dL — AB (ref 65–99)
Glucose-Capillary: 99 mg/dL (ref 65–99)
Glucose-Capillary: 93 mg/dL (ref 65–99)

## 2015-10-26 LAB — RENAL FUNCTION PANEL
Albumin: 2.4 g/dL — ABNORMAL LOW (ref 3.5–5.0)
Anion gap: 10 (ref 5–15)
BUN: 18 mg/dL (ref 6–20)
CO2: 28 mmol/L (ref 22–32)
CREATININE: 5.24 mg/dL — AB (ref 0.44–1.00)
Calcium: 9 mg/dL (ref 8.9–10.3)
Chloride: 99 mmol/L — ABNORMAL LOW (ref 101–111)
GFR calc Af Amer: 10 mL/min — ABNORMAL LOW (ref >60)
GFR, EST NON AFRICAN AMERICAN: 8 mL/min — AB (ref >60)
Glucose, Bld: 90 mg/dL (ref 65–99)
PHOSPHORUS: 2.6 mg/dL (ref 2.5–4.6)
Potassium: 3.9 mmol/L (ref 3.5–5.1)
SODIUM: 137 mmol/L (ref 135–145)

## 2015-10-26 MED ORDER — AMLODIPINE BESYLATE 5 MG PO TABS
5.0000 mg | ORAL_TABLET | Freq: Every day | ORAL | Status: DC
  Administered 2015-10-26: 5 mg via ORAL
  Filled 2015-10-26: qty 1

## 2015-10-26 MED ORDER — GUAIFENESIN-DM 100-10 MG/5ML PO SYRP
5.0000 mL | ORAL_SOLUTION | ORAL | Status: DC | PRN
  Administered 2015-10-26 – 2015-11-05 (×11): 5 mL via ORAL
  Filled 2015-10-26 (×11): qty 5

## 2015-10-26 NOTE — Clinical Social Work Note (Signed)
Clinical Social Work Assessment  Patient Details  Name: [name] MRN: [id number] Date of Birth: 1956-04-03  Date of referral:  10/19/15               Reason for consult:  Facility Placement                Permission sought to share information with:  Family Supports Permission granted to share information::  Yes, Verbal Permission Granted  Name::     Shadie Pawlowski  Agency::     Relationship::  Sister  Contact Information:  [phone]  Housing/Transportation Living arrangements for the past 2 months:  Single Family Home Source of Information:  Other (Comment Required) (Sister Ms. Conely) Patient Interpreter Needed:  None Criminal Activity/Legal Involvement Pertinent to Current Situation/Hospitalization:  No - Comment as needed Significant Relationships:  Siblings, Other Family Members Lives with:  Siblings Do you feel safe going back to the place where you live?  No (Sister/patient understands that rehab needed prior to patient returning home) Need for family participation in patient care:  Yes (Comment)  Care giving concerns:  Sister concerned about patient's current health status and receiving the care she needs post-discharge. Ms. Speir reported that prior to admission, patient lived with one of their sisters, however she plans to have patient stay with her for awhile after discharge from rehab.   Social Worker assessment / plan:  CSW talked with patient's sister Jaylia Jacober at the bedside. Patient did not participate in the conversation, as she appeared to be not feeling well , was sitting up on the side of the bed, and wanted to lie down. Ms. Baird informed CSW that they want [facility] for rehab and was informed that facility would be contacted and advised. Sister also wants to pursue [facility] for patient (for dialysis) and application left in room for sister/patient.  Employment status:  Disabled (Comment on whether or not currently receiving Disability) Insurance  information:  Managed Care, Medicaid [place] ([employer]) PT Recommendations:  [place] / Referral to community resources:  [place] (Facility list not provided as sister informed CSW of skilled facility preference)  Patient/Family's Response to care:  No concerns expressed by sister.  Patient/Family's Understanding of and Emotional Response to Diagnosis, Current Treatment, and Prognosis:  Not discussed.  Emotional Assessment Appearance:  Appears stated age Attitude/Demeanor/Rapport:  Other, Lethargic (Patient appeared letharigc and not feeling well. PT was in the room to work with patient, however patient wanted to lie down.) Affect (typically observed):  Quiet, Other Orientation:  Oriented to Self, Oriented to Place, Oriented to  Time, Oriented to Situation Alcohol / Substance use:  Never Used Psych involvement (Current and /or in the community):  No (Comment)  Discharge Needs  Concerns to be addressed:  Discharge Planning Concerns Readmission within the last 30 days:  No Current discharge risk:  None Barriers to Discharge:  No Barriers Identified   Sable Feil, LCSW 10/26/2015, 5:01 PM

## 2015-10-26 NOTE — Clinical Social Work Placement (Addendum)
   CLINICAL SOCIAL WORK PLACEMENT  NOTE  Date:  10/26/2015  Patient Details  Name: [name] MRN: [id number] Date of Birth: 1956-01-07  Clinical Social Work is seeking post-discharge placement for this patient at the [place] level of care (*CSW will initial, date and re-position this form in  chart as items are completed):  No (Sister/patient has facility preference)   Patient/family provided with [place] Work Department's list of facilities offering this level of care within the geographic area requested by the patient (or if unable, by the patient's family).  Yes   Patient/family informed of their freedom to choose among providers that offer the needed level of care, that participate in Medicare, Medicaid or managed care program needed by the patient, have an available bed and are willing to accept the patient.  No   Patient/family informed of [place]'s ownership interest in [facility] and [facility], as well as of the fact that they are under no obligation to receive care at these facilities.  PASRR submitted to EDS on       PASRR number received on       Existing PASRR number confirmed on 10/25/15     FL2 transmitted to all facilities in geographic area requested by pt/family on 10/19/15     FL2 transmitted to all facilities within larger geographic area on       Patient informed that his/her managed care company has contracts with or will negotiate with certain facilities, including the following:        Yes   Patient/family informed of bed offers received.  Patient chooses bed at [facility]     Physician recommends and patient chooses bed at      Patient to be transferred to [facility] on  .  Patient to be transferred to facility by       Patient family notified on   of transfer.  Name of family member notified:        PHYSICIAN      Additional Comment:  10/26/15 - [facility] contacted and informed that patient has selected their facility for ST rehab. They will be able to accept her once medically stable.   _______________________________________________ Sable Feil, LCSW 10/26/2015, 5:09 PM

## 2015-10-26 NOTE — Progress Notes (Signed)
Physical Therapy Treatment Patient Details Name: [name] MRN: [id number] DOB: 14-Nov-1955 Today's Date: 10/26/2015    History of Present Illness 60 year old female presenting with 10 days of progressive dyspnea. PMH is significant for CKD, HTN, T2DM, and seizure disorder    PT Comments    Pt progressing, quite motivated to work with PT although slightly anxious and stressed after attempts to get up to chair with nursing staff, pt had to return to bed after they stated chair was broken; portable came check chair during PT session and said it was not broken; brake was not working properly as I prepared chair for transfer--I was able to fix brake and educated pt and her sister on what I had done and for staff to continue to use caution with getting pt back to bed;  Pt ws too fatigued to amb but was agreeable to transfer;   Incr pt frequency to 3x/wk as she needs to be able to mobilize well enough to get to OP HD after D/C to SNF;  encouragaged pt to be up OOB/to BSC or bathroom as much as possible to improve her activity tolerance;  Pt may benefit from standard bari-recliner due to her short stature for OOB on days she does not have HD  Follow Up Recommendations  SNF;Supervision/Assistance - 24 hour     Equipment Recommendations  Rolling walker with 5" wheels;3in1 (PT)    Recommendations for Other Services       Precautions / Restrictions Precautions Precautions: Fall Restrictions Weight Bearing Restrictions: No    Mobility  Bed Mobility Overal bed mobility: Needs Assistance Bed Mobility: Supine to Sit;Sit to Supine     Supine to sit: Min assist Sit to supine: Mod assist;Min assist   General bed mobility comments: assist wtih LEs onto bed, feeling lightheaded after unsuccessful attempt to get up to chair with nsg staff; pt wanted to lie down briefly; min/mod assist to come to sitting, light assist with LEs to clear bed rail and assist with trunk  Transfers Overall transfer  level: Needs assistance Equipment used: Rolling walker (2 wheeled) Transfers: Sit to/from Stand Sit to Stand: Min assist Stand pivot transfers: Min assist       General transfer comment: verbal cues for foot placement/incr knee flexion, scooting to EOB and anterior superior wt shift, use of LEs to power up; cues for posture and RW safety for stand-step pivot  Ambulation/Gait   Ambulation Distance (Feet): 3 Feet Assistive device: Rolling walker (2 wheeled) Gait Pattern/deviations: Step-to pattern;Decreased step length - right;Decreased step length - left;Wide base of support     General Gait Details: mainly pivotal steps to chair as pt fatigued from events of day; verbal cues for RW saftey, technique, posture   Stairs            Wheelchair Mobility    Modified Rankin (Stroke Patients Only)       Balance             Standing balance-Leahy Scale: Poor Standing balance comment: reliant on UEs                    Cognition Arousal/Alertness: Awake/alert Behavior During Therapy: WFL for tasks assessed/performed Overall Cognitive Status: Within Functional Limits for tasks assessed                      Exercises General Exercises - Lower Extremity Long Arc Quad: AROM;Strengthening;Both;10 reps;Seated Heel Raises: AROM;Strengthening;Both;10 reps;Seated  General Comments        Pertinent Vitals/Pain Pain Assessment: No/denies pain    Home Living                      Prior Function            PT Goals (current goals can now be found in the care plan section) Acute Rehab PT Goals Patient Stated Goal: be able to return home PT Goal Formulation: With patient Time For Goal Achievement: 11/05/15 Potential to Achieve Goals: Good Progress towards PT goals: Progressing toward goals    Frequency  Min 3X/week    PT Plan Current plan remains appropriate;Frequency needs to be updated    Co-evaluation             End of  Session Equipment Utilized During Treatment: Gait belt Activity Tolerance: Patient tolerated treatment well Patient left: in chair;with call bell/phone within reach;with family/visitor present     Time: 1128-1212 PT Time Calculation (min) (ACUTE ONLY): 44 min  Charges:  $Therapeutic Activity: 38-52 mins                    G Codes:      [name] 28-Oct-2015, 1:36 PM

## 2015-10-26 NOTE — Care Management Note (Signed)
Case Management Note  Patient Details  Name: MEREL LAFORTE MRN: [id number] Date of Birth: 07-Dec-1955  Subjective/Objective:    CM following for progression and d/c planning.                 Action/Plan: 10/26/2015 This pt for d/c to SNF when outpatient HD center established.   Expected Discharge Date:                  Expected Discharge Plan:  Skilled Nursing Facility  In-House Referral:  Clinical Social Work  Discharge planning Services     Post Acute Care Choice:    Choice offered to:     DME Arranged:    DME Agency:     HH Arranged:    [place] Agency:     Status of Service:  In process, will continue to follow  Medicare Important Message Given:  Yes Date Medicare IM Given:    Medicare IM give by:    Date Additional Medicare IM Given:    Additional Medicare Important Message give by:     If discussed at [place] of Stay Meetings, dates discussed:    Additional Comments:  Adron Bene, RN 10/26/2015, 4:12 PM

## 2015-10-26 NOTE — Progress Notes (Signed)
PROGRESS NOTE  [name] [id number] DOB: 16-Nov-1955 DOA: 10/04/2015 PCP: Dwan Bolt, MD  Brief History:  Patient was admitted on 10/04/2015, with complaint of shortness of breath progressively worsening without any chest pain or cough or fever or chills, was found to have acute on chronic diastolic dysfunction with elevated troponin and elevated creatinine. Patient also had hyperkalemia with metabolic acidosis. Patient was treated conservatively with Lasix in the ER but later on transferred to [facility] on admission. Initial AV fistula hemodialysis was not successful and temporary hemodialysis catheter was placed. As per vascular surgery the AV fistula will be superficialized. Currently further plan is continue hemodialysis and arrange for outpatient discharge HD. Limiting factor in why the patient is still hospitalized due to difficulty finding an outpt HD center able to accommodate patient due to her deconditioning and impaired mobility and limited assistance she can be given at the outpt unit. She will need to demonstrate ability to engage in independent transfers.  Assessment/Plan: Acute hypoxic respiratory failure due to Acute pulmonary edema (HCC)  - Oxygenation is significantly improving with fluid removal, now fluid management per HD -now stable on RA -pulmonary edema more a function of renal failure rather than CHF  -10/26/15--discussed with cardiology--pt has not received any Entresto since admission and has preserved EF--hold entresto until f/u with Dr. Irish Lack  New ESRD - Left arm fistula placed in 07/25/2015. Unable to use at present due to being too deep - Tunneled HD catheter on 10/09/15. - Hemodialysis started on 5/5//17 via [facility] - AV fistula will require future procedures superficialization later and patient will need outpatient follow-up with vascular surgery. - Patient will need to be able to tolerate hemodialysis in chair in an outpatient  setting and demonstrate independent transfers-->doing better with only stand by assist per RN - overall weight since admission 412 >>>[phone]  Generalized fatigue and weakness with physical deconditioning. - Likely due to morbid obesity and prolonged hospital stay - TSH & cortisol level unremarkable. - Hemodynamically stable. Blood cultures no growth to date. - H&H improved after transfusion, improvement in weakness after transfusion. - Patient will need significant physical therapy. - As per CIR not a candidate for CIR and will need SNF. -10/25/15--doing better with transfers--doing better now with only stand by assist per RN  Elevated troponin. - Troponin was 11 on May 6 - 10/07/15 Echo-- EF-- 55-60%, no WMA; EKG does not show any acute ischemic changes. - Currently on aspirin and statin and beta blocker. - Cardiology was consulted and appreciate input, medical management for now  Acute encephalopathy. - resolved - Avoid sedating medications.  Essential hypertension. - Blood pressure getting stable after hemodialysis initiation. - with improving BP on HD, decrease amlodipine to 5 mg  Anemia of chronic kidney disease. - TSH within normal limits. -Iron saturation 26%, ferritin 47, folic acid [id number] - 0000000 elevated. - No active bleeding. - Transfused 1 unit on May of 9, due to significant weakness and monitor clinically. - Monitor serial H&H--remains largely stable in the 9-10 range  Obesity hypoventilation syndrome. - Resume home CPAP.  Constipation >> diarrhea - Currently resolved, Miralax / Senokot discontinued at patient's request since she was having diarrhea - diarrhea resolved, then developed constipation - Senokot for constipation. Add lactulose. Also Enema. Good results 5/23  Impaired glucose tolerance -HbA1c--6.2 -previously on victoza prior to admission   Disposition Plan: SNF when accepted by HD center Family Communication: no Family at beside  Consultants:  Cardiology, Renal, vascular surgery  Code Status: FULL  Subjective:   Objective: Filed Vitals:   10/25/15 2059 10/25/15 2217 10/26/15 0420 10/26/15 1100  BP: 144/94  111/74 103/59  Pulse: 112 98 103 83  Temp: 98.4 F (36.9 C)  98.7 F (37.1 C) 98.8 F (37.1 C)  TempSrc: Oral  Oral Oral  Resp: 17 16 18 20   Height:      Weight: 156 kg (343 lb 14.7 oz)     SpO2: 97% 95% 98% 98%    Intake/Output Summary (Last 24 hours) at 10/26/15 1826 Last data filed at 10/26/15 1300  Gross per 24 hour  Intake    600 ml  Output      0 ml  Net    600 ml   Weight change: 0 kg (0 lb) Exam:   General:  Pt is alert, follows commands appropriately, not in acute distress  HEENT: No icterus, No thrush, No neck mass, [place]/AT  Cardiovascular: RRR, S1/S2, no rubs, no gallops  Respiratory: CTA bilaterally, no wheezing, no crackles, no rhonchi  Abdomen: Soft/+BS, non tender, non distended, no guarding  Extremities: No edema, No lymphangitis, No petechiae, No rashes, no synovitis   Data Reviewed: I have personally reviewed following labs and imaging studies Basic Metabolic Panel:  Recent Labs Lab 10/21/15 0556 10/22/15 0416 10/23/15 0524 10/24/15 0610 10/25/15 0547 10/26/15 0502  NA 136 135 136 137 137 137  K 4.4 5.0 4.7 3.9 4.5 3.9  CL 95* 98* 98* 98* 99* 99*  CO2 29 26 27 29 28 28   GLUCOSE 87 123* 101* 80 80 90  BUN 32* 43* 45* 18 27* 18  CREATININE 7.10* 8.57* 8.52* 5.18* 7.12* 5.24*  CALCIUM 9.2 8.9 9.0 9.0 9.2 9.0  PHOS 3.4 3.2 3.6  --  3.6 2.6   Liver Function Tests:  Recent Labs Lab 10/21/15 0556 10/22/15 0416 10/23/15 0524 10/25/15 0547 10/26/15 0502  ALBUMIN 2.1* 2.2* 2.2* 2.4* 2.4*   No results for input(s): LIPASE, AMYLASE in the last 168 hours. No results for input(s): AMMONIA in the last 168 hours. Coagulation Profile: No results for input(s): INR, PROTIME in the last 168 hours. CBC:  Recent Labs Lab 10/20/15 0601 10/22/15 1330 10/23/15 1515  10/24/15 0610 10/25/15 0830  WBC 9.4 8.1 7.3 7.0 6.3  HGB 10.4* 10.5* 9.2* 9.8* 10.0*  HCT 34.8* 33.3* 30.1* 32.0* 32.9*  MCV 90.2 87.6 87.5 88.6 87.7  PLT 283 308 273 261 266   Cardiac Enzymes: No results for input(s): CKTOTAL, CKMB, CKMBINDEX, TROPONINI in the last 168 hours. BNP: Invalid input(s): POCBNP CBG:  Recent Labs Lab 10/25/15 1645 10/25/15 2106 10/26/15 0737 10/26/15 1214 10/26/15 1646  GLUCAP 89 91 99 99 93   HbA1C: No results for input(s): HGBA1C in the last 72 hours. Urine analysis: No results found for: COLORURINE, APPEARANCEUR, LABSPEC, PHURINE, GLUCOSEU, HGBUR, BILIRUBINUR, KETONESUR, PROTEINUR, UROBILINOGEN, NITRITE, LEUKOCYTESUR Sepsis Labs: @LABRCNTIP (procalcitonin:4,lacticidven:4) )No results found for this or any previous visit (from the past 240 hour(s)).   Scheduled Meds: . alteplase  2 mg Intracatheter Once  . amLODipine  10 mg Oral QHS  . aspirin EC  81 mg Oral Daily  . atorvastatin  40 mg Oral Daily  . darbepoetin (ARANESP) injection - DIALYSIS  100 mcg Intravenous Q Thu-HD  . doxercalciferol  1 mcg Intravenous Q T,Th,Sa-HD  . febuxostat  40 mg Oral Daily  . ferric gluconate (FERRLECIT/NULECIT) IV  125 mg Intravenous Q T,Th,Sa-HD  . heparin subcutaneous  5,000  Units Subcutaneous Q8H  . hydrocortisone   Rectal BID  . metoprolol succinate  50 mg Oral BID  . multivitamin with minerals  1 tablet Oral Daily  . neomycin-bacitracin-polymyxin   Topical Daily  . polyethylene glycol  17 g Oral Daily  . senna-docusate  2 tablet Oral BID  . sevelamer carbonate  2,400 mg Oral TID WC  . sodium chloride flush  3 mL Intravenous Q12H   Continuous Infusions:   Procedures/Studies: Dg Chest 2 View  10/13/2015  CLINICAL DATA:  60 year old female with shortness of breath. EXAM: CHEST  2 VIEW COMPARISON:  Chest x-ray 10/09/2015. FINDINGS: Right internal jugular Vas-Cath with tip terminating in the mid to distal superior vena cava. Lung volumes are slightly  low. There is cephalization of the pulmonary vasculature and slight indistinctness of the interstitial markings suggestive of mild pulmonary edema. No pleural effusions. Mild cardiomegaly. Upper mediastinal contours are within normal limits allowing for patient rotation to the right and lordotic positioning. IMPRESSION: 1. The appearance of the chest suggests mild congestive heart failure, as above. Electronically Signed   By: Vinnie Langton M.D.   On: 10/13/2015 13:40   Dg Chest Port 1 View  10/26/2015  CLINICAL DATA:  Cough, hypertension EXAM: PORTABLE CHEST 1 VIEW COMPARISON:  10/13/2015 FINDINGS: Right dialysis catheter remains in place, unchanged. Heart is borderline in size. No confluent airspace opacities, effusions or edema. No acute bony abnormality. IMPRESSION: No active disease. Electronically Signed   By: Rolm Baptise M.D.   On: 10/26/2015 07:09   Dg Chest Port 1 View  10/09/2015  CLINICAL DATA:  Dialysis catheter insertion EXAM: PORTABLE CHEST 1 VIEW COMPARISON:  10/08/2015 FINDINGS: Right IJ dialysis catheter tips SVC RA junction. No pneumothorax or effusion. Stable cardiomegaly and central vascular congestion. No focal pneumonia, collapse or consolidation. Degenerative changes of the spine. Stable hilar prominence as before. IMPRESSION: Right IJ dialysis catheter tips SVC RA junction. Stable cardiomegaly and central vascular congestion No complicating feature. Electronically Signed   By: Jerilynn Mages.  Shick M.D.   On: 10/09/2015 16:25   Dg Chest Port 1 View  10/08/2015  CLINICAL DATA:  Cough EXAM: PORTABLE CHEST 1 VIEW COMPARISON:  10/05/2015 FINDINGS: Cardiomegaly again noted. Central mild vascular congestion without convincing pulmonary edema. Mild left basilar atelectasis. Right IJ central line is unchanged in position. IMPRESSION: Central mild vascular congestion without convincing pulmonary edema. Mild left basilar atelectasis. Cardiomegaly again noted. Electronically Signed   By: Lahoma Crocker M.D.    On: 10/08/2015 08:16   Dg Chest Port 1 View  10/05/2015  CLINICAL DATA:  Central line placement. EXAM: PORTABLE CHEST 1 VIEW COMPARISON:  10/04/2015 FINDINGS: Right central line placement with the tip in the SVC. No pneumothorax. Heart is mildly enlarged. Mild vascular congestion. Mild interstitial prominence similar prior study. No acute bony abnormality. IMPRESSION: Right central line tip in the SVC. No pneumothorax. Otherwise no real change. Electronically Signed   By: Rolm Baptise M.D.   On: 10/05/2015 12:17   Dg Chest Port 1 View  10/04/2015  CLINICAL DATA:  Chest pain, shortness of breath, and cough for 1 week, worse tonight. EXAM: PORTABLE CHEST 1 VIEW COMPARISON:  09/28/2015 FINDINGS: Shallow inspiration. Cardiac enlargement with pulmonary vascular congestion. Mild interstitial pattern suggesting early interstitial edema. Similar appearance to previous study. No blunting of costophrenic angles. No pneumothorax. Mediastinal contours appear intact. IMPRESSION: Cardiac enlargement with pulmonary vascular congestion and mild interstitial edema similar to prior study. Electronically Signed   By: Lucienne Capers  M.D.   On: 10/04/2015 23:29   Dg Abd Portable 1v  10/15/2015  CLINICAL DATA:  Abdominal pain. EXAM: PORTABLE ABDOMEN - 1 VIEW COMPARISON:  10/14/2015 FINDINGS: Examination is limited due to patient's body habitus. Visualized abdomen demonstrates gas and stool in the colon. No small or large bowel distention. Calcification in the right upper quadrant consistent with gallstone. Surgical clips in the right lower quadrant. No radiopaque stones identified. IMPRESSION: Nonobstructive bowel gas pattern with scattered gas and stool in the colon. Cholelithiasis. Electronically Signed   By: Lucienne Capers M.D.   On: 10/15/2015 02:34   Dg Abd Portable 1v  10/14/2015  CLINICAL DATA:  Constipation EXAM: PORTABLE ABDOMEN - 1 VIEW COMPARISON:  CT abdomen 05/02/2009 FINDINGS: Image quality limited by large  patient size Distended stomach.  Mildly distended colon.  Small bowel nondilated. Right upper quadrant calcification compatible gallstone as noted on CT. IMPRESSION: Limited exam.  Mild colonic ileus. Electronically Signed   By: Franchot Gallo M.D.   On: 10/14/2015 13:47    Kalanie Fewell, DO  [facility] Pager [phone]  If 7PM-7AM, please contact night-coverage [email or web address] Password [email or web address] 10/26/2015, 6:26 PM   LOS: 21 days

## 2015-10-26 NOTE — Progress Notes (Signed)
Patient had episode of not feeling well this am, feels weak, denies any short of breath or chest pain and other discomfort. Vital signs stable had been coughing intermittently during the night and very uncomfortable in bed. Repositioned multiple times with some relief. Patient verbalized coughing is more worse this morning than the other night. TShelda Altes was notified with orders made. Chest x-ray was done and cough medicine was ordered prn. Will continue to monitor patient.

## 2015-10-26 NOTE — Progress Notes (Addendum)
Patient ID: [name], female   DOB: Sep 04, 1955, 60 y.o.   MRN: [id number]   [facility] Progress Note   Assessment/ Plan:   1. Acute hypoxic respiratory failure secondary to pulmonary edema/diastolic CHF: Improved with hemodialysis/ultrafiltration. Continues to tolerate exertion without significant hypoxia. 2. ESRD: Hemodialysis will be done again tomorrow (and will continue on a TTS Schedule after disrupted earlier in the week). Recent infiltration of left brachiocephalic fistula deemed too deep and will need superficialization-- still with some swelling and tenderness at the site of recent infiltration.  3. Anemia: Without overt loss, hemoglobin level is acceptable current dose of Aranesp. 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 Nutrition: With morbid obesity but hypoalbuminemia from progressive CKD/inflammatory component of TDC 6. Hypertension: Blood pressures acceptable-anticipate to improve further with ultrafiltration and hemodialysis.  She will need continued support from physical therapy to demonstrate ability for transfers and mobility prior to acceptance at an outpatient dialysis unit given the limited help available from staff at the center particularly with regards to bariatric care.  Subjective:   Reports to have tolerated dialysis well-currently denies any complaints including shortness of breath or chest pain.    Objective:   BP 111/74 mmHg  Pulse 103  Temp(Src) 98.7 F (37.1 C) (Oral)  Resp 18  Ht 5\' 6"  (1.676 m)  Wt 156 kg (343 lb 14.7 oz)  BMI 55.54 kg/m2  SpO2 98%  Physical Exam: [id number] resting in bariatric recliner at HD CVS: Pulse regular rhythm, S1 and S2 normal Resp: Decreased breath sounds over bases, no rales/rhonchi Abd: Soft, obese, nontender Ext: Chronic lymphedema with some pitting edema  Labs: BMET  Recent Labs Lab 10/20/15 0601 10/21/15 0556  10/22/15 0416 10/23/15 0524 10/24/15 0610 10/25/15 0547 10/26/15 0502  NA 138 136 135 136 137 137 137  K 4.4 4.4 5.0 4.7 3.9 4.5 3.9  CL 95* 95* 98* 98* 98* 99* 99*  CO2 30 29 26 27 29 28 28   GLUCOSE 90 87 123* 101* 80 80 90  BUN 21* 32* 43* 45* 18 27* 18  CREATININE 5.25* 7.10* 8.57* 8.52* 5.18* 7.12* 5.24*  CALCIUM 9.2 9.2 8.9 9.0 9.0 9.2 9.0  PHOS 3.2 3.4 3.2 3.6  --  3.6 2.6   CBC  Recent Labs Lab 10/22/15 1330 10/23/15 1515 10/24/15 0610 10/25/15 0830  WBC 8.1 7.3 7.0 6.3  HGB 10.5* 9.2* 9.8* 10.0*  HCT 33.3* 30.1* 32.0* 32.9*  MCV 87.6 87.5 88.6 87.7  PLT 308 273 261 266   Medications:    . alteplase  2 mg Intracatheter Once  . amLODipine  10 mg Oral QHS  . aspirin EC  81 mg Oral Daily  . atorvastatin  40 mg Oral Daily  . darbepoetin (ARANESP) injection - DIALYSIS  100 mcg Intravenous Q Thu-HD  . doxercalciferol  1 mcg Intravenous Q T,Th,Sa-HD  . febuxostat  40 mg Oral Daily  . ferric gluconate (FERRLECIT/NULECIT) IV  125 mg Intravenous Q T,Th,Sa-HD  . heparin subcutaneous  5,000 Units Subcutaneous Q8H  . hydrocortisone   Rectal BID  . metoprolol succinate  50 mg Oral BID  . multivitamin with minerals  1 tablet Oral Daily  . neomycin-bacitracin-polymyxin   Topical Daily  . polyethylene glycol  17 g Oral Daily  . senna-docusate  2 tablet Oral BID  . sevelamer carbonate  2,400 mg Oral TID WC  . sodium chloride flush  3 mL Intravenous Q12H   Elmarie Shiley,  MD 10/26/2015, 10:12 AM

## 2015-10-26 NOTE — Clinical Social Work Note (Signed)
Patient provided with SCAT application, at her request and professional verification section completed by CSW. Patient will complete application and requested that Dale fax to [place] office on Tuesday.   Somaya Grassi Givens, MSW, LCSW Licensed Clinical Social Worker [place] [phone]

## 2015-10-26 NOTE — Care Management Important Message (Signed)
Important Message  Patient Details  Name: [name] MRN: [id number] Date of Birth: 10-May-1956   Medicare Important Message Given:  Yes    Locke Barrell, Rory Percy, RN 10/26/2015, 4:11 PM

## 2015-10-27 LAB — RENAL FUNCTION PANEL
ALBUMIN: 2.6 g/dL — AB (ref 3.5–5.0)
Anion gap: 10 (ref 5–15)
BUN: 27 mg/dL — ABNORMAL HIGH (ref 6–20)
CHLORIDE: 96 mmol/L — AB (ref 101–111)
CO2: 28 mmol/L (ref 22–32)
Calcium: 9.3 mg/dL (ref 8.9–10.3)
Creatinine, Ser: 7.34 mg/dL — ABNORMAL HIGH (ref 0.44–1.00)
GFR calc Af Amer: 6 mL/min — ABNORMAL LOW (ref >60)
GFR, EST NON AFRICAN AMERICAN: 5 mL/min — AB (ref >60)
Glucose, Bld: 96 mg/dL (ref 65–99)
PHOSPHORUS: 3 mg/dL (ref 2.5–4.6)
POTASSIUM: 3.9 mmol/L (ref 3.5–5.1)
Sodium: 134 mmol/L — ABNORMAL LOW (ref 135–145)

## 2015-10-27 LAB — CBC
HEMATOCRIT: 34.8 % — AB (ref 36.0–46.0)
HEMOGLOBIN: 10.6 g/dL — AB (ref 12.0–15.0)
MCH: 27 pg (ref 26.0–34.0)
MCHC: 30.5 g/dL (ref 30.0–36.0)
MCV: 88.5 fL (ref 78.0–100.0)
Platelets: 251 10*3/uL (ref 150–400)
RBC: 3.93 MIL/uL (ref 3.87–5.11)
RDW: 15.8 % — ABNORMAL HIGH (ref 11.5–15.5)
WBC: 7.8 10*3/uL (ref 4.0–10.5)

## 2015-10-27 LAB — GLUCOSE, CAPILLARY
GLUCOSE-CAPILLARY: 83 mg/dL (ref 65–99)
GLUCOSE-CAPILLARY: 118 mg/dL — AB (ref 65–99)
Glucose-Capillary: 125 mg/dL — ABNORMAL HIGH (ref 65–99)

## 2015-10-27 MED ORDER — HEPARIN SODIUM (PORCINE) 1000 UNIT/ML DIALYSIS: 4000.0000 [IU] | INTRAMUSCULAR | Status: DC | PRN

## 2015-10-27 MED ORDER — DOXERCALCIFEROL 4 MCG/2ML IV SOLN
INTRAVENOUS | Status: AC
  Filled 2015-10-27: qty 2

## 2015-10-27 MED ORDER — CAMPHOR-MENTHOL 0.5-0.5 % EX LOTN
TOPICAL_LOTION | CUTANEOUS | Status: DC | PRN
  Filled 2015-10-27: qty 222

## 2015-10-27 NOTE — Progress Notes (Signed)
PROGRESS NOTE  [name] [id number] DOB: 1955-10-19 DOA: 10/04/2015 PCP: Dwan Bolt, MD  Brief History:  Patient was admitted on 10/04/2015, with complaint of shortness of breath progressively worsening without any chest pain or cough or fever or chills, was found to have acute on chronic diastolic dysfunction with elevated troponin and elevated creatinine. Patient also had hyperkalemia with metabolic acidosis. Patient was treated conservatively with Lasix in the ER but later on transferred to [facility] on admission. Initial AV fistula hemodialysis was not successful and temporary hemodialysis catheter was placed. As per vascular surgery the AV fistula will be superficialized. Currently further plan is continue hemodialysis and arrange for outpatient discharge HD. Limiting factor in why the patient is still hospitalized due to difficulty finding an outpt HD center able to accommodate patient due to her deconditioning and impaired mobility and limited assistance she can be given at the outpt unit. She will need to demonstrate ability to engage in independent transfers.  Assessment/Plan: Acute hypoxic respiratory failure due to Acute pulmonary edema (HCC)  - Oxygenation is significantly improving with fluid removal, now fluid management per HD -now stable on RA -pulmonary edema more a function of renal failure rather than CHF  -10/26/15--discussed with cardiology--pt has not received any Entresto since admission and has preserved EF--hold entresto until f/u with Dr. Irish Lack  New ESRD - Left arm fistula placed in 07/25/2015. Unable to use at present due to being too deep - Tunneled HD catheter on 10/09/15. - Hemodialysis started on 5/5//17 via [facility] - AV fistula will require future procedures superficialization later and patient will need outpatient follow-up with vascular surgery. - Patient will need to be able to tolerate hemodialysis in chair in an outpatient  setting and demonstrate independent transfers-->doing better with only stand by assist per RN - overall weight since admission 412 >>>343  Generalized fatigue and weakness with physical deconditioning. - Likely due to morbid obesity and prolonged hospital stay - TSH & cortisol level unremarkable. - Hemodynamically stable. Blood cultures no growth to date. - H&H improved after transfusion, improvement in weakness after transfusion. - Patient will need significant physical therapy. - As per CIR not a candidate for CIR and will need SNF. -10/25/15--doing better with transfers--doing better now with only stand by assist per RN  Elevated troponin. - Troponin was 11 on May 6 - 10/07/15 Echo-- EF-- 55-60%, no WMA; EKG does not show any acute ischemic changes. - Currently on aspirin and statin and beta blocker. - Cardiology was consulted and appreciate input, medical management for now  Acute encephalopathy. - resolved - Avoid sedating medications.  Essential hypertension. - Blood pressure getting stable after hemodialysis initiation. - BP trending down on HD -d/c amlodipine, continue metoprolol succinate  Anemia of chronic kidney disease. - TSH within normal limits. -Iron saturation 26%, ferritin 47, folic acid 123456 - 0000000 elevated. - No active bleeding. - Transfused 1 unit on May of 9, due to significant weakness  - Monitor serial H&H--remains largely stable in the 9-10 range  Obesity hypoventilation syndrome. - Resume home CPAP.  Constipation >> diarrhea - Currently resolved, Miralax / Senokot discontinued at patient's request since she was having diarrhea - diarrhea resolved, then developed constipation - Senokot for constipation. Add lactulose. Also Enema. Good results 5/23  Impaired glucose tolerance -HbA1c--6.2 -previously on victoza prior to admission   Disposition Plan: SNF when accepted by HD center Family Communication: no Family at beside  Consultants:  Cardiology, Renal, vascular surgery  Code Status: FULL  Subjective: Patient complains of some itching on her back. Denies any fevers, chills, chest pain, sinus breath, nausea, vomiting, diarrhea. Denies any abdominal pain, headache, neck pain.  Objective: Filed Vitals:   10/27/15 1000 10/27/15 1030 10/27/15 1100 10/27/15 1134  BP: 110/67 111/64 118/58 113/51  Pulse: 83 84 88 89  Temp:   97.9 F (36.6 C) 98 F (36.7 C)  TempSrc:   Oral Oral  Resp:   18 16  Height:      Weight:      SpO2:   99% 99%    Intake/Output Summary (Last 24 hours) at 10/27/15 1602 Last data filed at 10/27/15 1247  Gross per 24 hour  Intake    300 ml  Output   1500 ml  Net  -1200 ml   Weight change:  Exam:   General:  Pt is alert, follows commands appropriately, not in acute distress  HEENT: No icterus, No thrush, No neck mass, [place]/AT  Cardiovascular: RRR, S1/S2, no rubs, no gallops  Respiratory: CTA bilaterally, no wheezing, no crackles, no rhonchi  Abdomen: Soft/+BS, non tender, non distended, no guarding  Extremities: 1 + LE edema, No lymphangitis, No petechiae, No rashes, no synovitis   Data Reviewed: I have personally reviewed following labs and imaging studies Basic Metabolic Panel:  Recent Labs Lab 10/22/15 0416 10/23/15 0524 10/24/15 0610 10/25/15 0547 10/26/15 0502 10/27/15 0709  NA 135 136 137 137 137 134*  K 5.0 4.7 3.9 4.5 3.9 3.9  CL 98* 98* 98* 99* 99* 96*  CO2 26 27 29 28 28 28   GLUCOSE 123* 101* 80 80 90 96  BUN 43* 45* 18 27* 18 27*  CREATININE 8.57* 8.52* 5.18* 7.12* 5.24* 7.34*  CALCIUM 8.9 9.0 9.0 9.2 9.0 9.3  PHOS 3.2 3.6  --  3.6 2.6 3.0   Liver Function Tests:  Recent Labs Lab 10/22/15 0416 10/23/15 0524 10/25/15 0547 10/26/15 0502 10/27/15 0709  ALBUMIN 2.2* 2.2* 2.4* 2.4* 2.6*   No results for input(s): LIPASE, AMYLASE in the last 168 hours. No results for input(s): AMMONIA in the last 168 hours. Coagulation Profile: No results for  input(s): INR, PROTIME in the last 168 hours. CBC:  Recent Labs Lab 10/22/15 1330 10/23/15 1515 10/24/15 0610 10/25/15 0830 10/27/15 0709  WBC 8.1 7.3 7.0 6.3 7.8  HGB 10.5* 9.2* 9.8* 10.0* 10.6*  HCT 33.3* 30.1* 32.0* 32.9* 34.8*  MCV 87.6 87.5 88.6 87.7 88.5  PLT 308 273 261 266 251   Cardiac Enzymes: No results for input(s): CKTOTAL, CKMB, CKMBINDEX, TROPONINI in the last 168 hours. BNP: Invalid input(s): POCBNP CBG:  Recent Labs Lab 10/26/15 0737 10/26/15 1214 10/26/15 1646 10/26/15 2047 10/27/15 1139  GLUCAP 99 99 93 130* 125*   HbA1C: No results for input(s): HGBA1C in the last 72 hours. Urine analysis: No results found for: COLORURINE, APPEARANCEUR, LABSPEC, PHURINE, GLUCOSEU, HGBUR, BILIRUBINUR, KETONESUR, PROTEINUR, UROBILINOGEN, NITRITE, LEUKOCYTESUR Sepsis Labs: @LABRCNTIP (procalcitonin:4,lacticidven:4) )No results found for this or any previous visit (from the past 240 hour(s)).   Scheduled Meds: . alteplase  2 mg Intracatheter Once  . aspirin EC  81 mg Oral Daily  . atorvastatin  40 mg Oral Daily  . darbepoetin (ARANESP) injection - DIALYSIS  100 mcg Intravenous Q Thu-HD  . doxercalciferol  1 mcg Intravenous Q T,Th,Sa-HD  . febuxostat  40 mg Oral Daily  . heparin subcutaneous  5,000 Units Subcutaneous Q8H  . hydrocortisone   Rectal BID  .  metoprolol succinate  50 mg Oral BID  . multivitamin with minerals  1 tablet Oral Daily  . neomycin-bacitracin-polymyxin   Topical Daily  . polyethylene glycol  17 g Oral Daily  . senna-docusate  2 tablet Oral BID  . sevelamer carbonate  2,400 mg Oral TID WC  . sodium chloride flush  3 mL Intravenous Q12H   Continuous Infusions:   Procedures/Studies: Dg Chest 2 View  10/13/2015  CLINICAL DATA:  60 year old female with shortness of breath. EXAM: CHEST  2 VIEW COMPARISON:  Chest x-ray 10/09/2015. FINDINGS: Right internal jugular Vas-Cath with tip terminating in the mid to distal superior vena cava. Lung volumes  are slightly low. There is cephalization of the pulmonary vasculature and slight indistinctness of the interstitial markings suggestive of mild pulmonary edema. No pleural effusions. Mild cardiomegaly. Upper mediastinal contours are within normal limits allowing for patient rotation to the right and lordotic positioning. IMPRESSION: 1. The appearance of the chest suggests mild congestive heart failure, as above. Electronically Signed   By: Vinnie Langton M.D.   On: 10/13/2015 13:40   Dg Chest Port 1 View  10/26/2015  CLINICAL DATA:  Cough, hypertension EXAM: PORTABLE CHEST 1 VIEW COMPARISON:  10/13/2015 FINDINGS: Right dialysis catheter remains in place, unchanged. Heart is borderline in size. No confluent airspace opacities, effusions or edema. No acute bony abnormality. IMPRESSION: No active disease. Electronically Signed   By: Rolm Baptise M.D.   On: 10/26/2015 07:09   Dg Chest Port 1 View  10/09/2015  CLINICAL DATA:  Dialysis catheter insertion EXAM: PORTABLE CHEST 1 VIEW COMPARISON:  10/08/2015 FINDINGS: Right IJ dialysis catheter tips SVC RA junction. No pneumothorax or effusion. Stable cardiomegaly and central vascular congestion. No focal pneumonia, collapse or consolidation. Degenerative changes of the spine. Stable hilar prominence as before. IMPRESSION: Right IJ dialysis catheter tips SVC RA junction. Stable cardiomegaly and central vascular congestion No complicating feature. Electronically Signed   By: Jerilynn Mages.  Shick M.D.   On: 10/09/2015 16:25   Dg Chest Port 1 View  10/08/2015  CLINICAL DATA:  Cough EXAM: PORTABLE CHEST 1 VIEW COMPARISON:  10/05/2015 FINDINGS: Cardiomegaly again noted. Central mild vascular congestion without convincing pulmonary edema. Mild left basilar atelectasis. Right IJ central line is unchanged in position. IMPRESSION: Central mild vascular congestion without convincing pulmonary edema. Mild left basilar atelectasis. Cardiomegaly again noted. Electronically Signed   By:  Lahoma Crocker M.D.   On: 10/08/2015 08:16   Dg Chest Port 1 View  10/05/2015  CLINICAL DATA:  Central line placement. EXAM: PORTABLE CHEST 1 VIEW COMPARISON:  10/04/2015 FINDINGS: Right central line placement with the tip in the SVC. No pneumothorax. Heart is mildly enlarged. Mild vascular congestion. Mild interstitial prominence similar prior study. No acute bony abnormality. IMPRESSION: Right central line tip in the SVC. No pneumothorax. Otherwise no real change. Electronically Signed   By: Rolm Baptise M.D.   On: 10/05/2015 12:17   Dg Chest Port 1 View  10/04/2015  CLINICAL DATA:  Chest pain, shortness of breath, and cough for 1 week, worse tonight. EXAM: PORTABLE CHEST 1 VIEW COMPARISON:  09/28/2015 FINDINGS: Shallow inspiration. Cardiac enlargement with pulmonary vascular congestion. Mild interstitial pattern suggesting early interstitial edema. Similar appearance to previous study. No blunting of costophrenic angles. No pneumothorax. Mediastinal contours appear intact. IMPRESSION: Cardiac enlargement with pulmonary vascular congestion and mild interstitial edema similar to prior study. Electronically Signed   By: Lucienne Capers M.D.   On: 10/04/2015 23:29   Dg Abd Portable 1v  10/15/2015  CLINICAL DATA:  Abdominal pain. EXAM: PORTABLE ABDOMEN - 1 VIEW COMPARISON:  10/14/2015 FINDINGS: Examination is limited due to patient's body habitus. Visualized abdomen demonstrates gas and stool in the colon. No small or large bowel distention. Calcification in the right upper quadrant consistent with gallstone. Surgical clips in the right lower quadrant. No radiopaque stones identified. IMPRESSION: Nonobstructive bowel gas pattern with scattered gas and stool in the colon. Cholelithiasis. Electronically Signed   By: Lucienne Capers M.D.   On: 10/15/2015 02:34   Dg Abd Portable 1v  10/14/2015  CLINICAL DATA:  Constipation EXAM: PORTABLE ABDOMEN - 1 VIEW COMPARISON:  CT abdomen 05/02/2009 FINDINGS: Image quality  limited by large patient size Distended stomach.  Mildly distended colon.  Small bowel nondilated. Right upper quadrant calcification compatible gallstone as noted on CT. IMPRESSION: Limited exam.  Mild colonic ileus. Electronically Signed   By: Franchot Gallo M.D.   On: 10/14/2015 13:47    Caidyn Henricksen, DO  [facility] Pager [phone]  If 7PM-7AM, please contact night-coverage [email or web address] Password [email or web address] 10/27/2015, 4:02 PM   LOS: 22 days

## 2015-10-27 NOTE — Procedures (Signed)
Patient seen on Hemodialysis. QB 375, UF goal 2L Treatment adjusted as needed.  Elmarie Shiley MD [facility]. Office # [phone] Pager # [phone] 10:36 AM

## 2015-10-28 DIAGNOSIS — K5909 Other constipation: Secondary | ICD-10-CM

## 2015-10-28 DIAGNOSIS — K5900 Constipation, unspecified: Secondary | ICD-10-CM | POA: Insufficient documentation

## 2015-10-28 LAB — GLUCOSE, CAPILLARY
GLUCOSE-CAPILLARY: 98 mg/dL (ref 65–99)
GLUCOSE-CAPILLARY: 84 mg/dL (ref 65–99)
GLUCOSE-CAPILLARY: 100 mg/dL — AB (ref 65–99)
Glucose-Capillary: 127 mg/dL — ABNORMAL HIGH (ref 65–99)

## 2015-10-28 MED ORDER — LACTULOSE 10 GM/15ML PO SOLN
20.0000 g | Freq: Two times a day (BID) | ORAL | Status: DC
  Administered 2015-10-29: 20 g via ORAL
  Filled 2015-10-28 (×2): qty 30

## 2015-10-28 MED ORDER — BISACODYL 10 MG RE SUPP
10.0000 mg | Freq: Once | RECTAL | Status: AC
  Administered 2015-10-28: 10 mg via RECTAL
  Filled 2015-10-28: qty 1

## 2015-10-28 MED ORDER — METOPROLOL SUCCINATE ER 50 MG PO TB24
50.0000 mg | ORAL_TABLET | Freq: Every day | ORAL | Status: DC
  Administered 2015-10-29: 50 mg via ORAL
  Filled 2015-10-28: qty 1

## 2015-10-28 NOTE — Progress Notes (Signed)
Pt. placed on her BiPAP for h/s, able to do on own, room air, tolerating well, RT to monitor.

## 2015-10-28 NOTE — Progress Notes (Signed)
PROGRESS NOTE  [name] [id number] DOB: [birth year] DOA: 10/04/2015 PCP: Dwan Bolt, MD  Brief History:  Patient was admitted on 10/04/2015, with complaint of shortness of breath progressively worsening without any chest pain or cough or fever or chills, was found to have acute on chronic diastolic dysfunction with elevated troponin and elevated creatinine. Patient also had hyperkalemia with metabolic acidosis. Patient was treated conservatively with Lasix in the ER but later on transferred to [facility] on admission. Initial AV fistula hemodialysis was not successful and temporary hemodialysis catheter was placed. As per vascular surgery the AV fistula will be superficialized. Currently further plan is continue hemodialysis and arrange for outpatient discharge HD. Limiting factor in why the patient is still hospitalized due to difficulty finding an outpt HD center able to accommodate patient due to her deconditioning and impaired mobility and limited assistance she can be given at the outpt unit. She will need to demonstrate ability to engage in independent transfers.  Assessment/Plan: Acute hypoxic respiratory failure due to Acute pulmonary edema (HCC)  - Oxygenation is significantly improving with fluid removal, now fluid management per HD -now stable on RA -pulmonary edema more a function of renal failure rather than CHF  -10/26/15--discussed with cardiology--pt has not received any Entresto since admission and has preserved EF--hold entresto until f/u with Dr. Irish Lack  New ESRD - Left arm fistula placed in 07/25/2015. Unable to use at present due to being too deep - Tunneled HD catheter on 10/09/15. - Hemodialysis started on 5/5//17 via [facility] - AV fistula will require future procedures superficialization later and patient will need outpatient follow-up with vascular surgery. - Patient will need to be able to tolerate hemodialysis in chair in an outpatient  setting and demonstrate independent transfers-->doing better with only stand by assist per RN - overall weight since admission 412 [phone]  Generalized fatigue and weakness with physical deconditioning. - Likely due to morbid obesity and prolonged hospital stay - TSH & cortisol level unremarkable. - Hemodynamically stable. Blood cultures no growth to date. - H&H improved after transfusion, improvement in weakness after transfusion. - Patient will need significant physical therapy. - As per CIR not a candidate for CIR and will need SNF. -10/25/15--doing better with transfers--doing better now with only stand by assist per RN  Elevated troponin. - Troponin was 11 on May 6 - 10/07/15 Echo-- EF-- 55-60%, no WMA; EKG does not show any acute ischemic changes. - Currently on aspirin and statin and beta blocker. - Cardiology was consulted and appreciate input, medical management for now  Acute encephalopathy. - resolved - Avoid sedating medications.  Essential hypertension. - Blood pressure getting stable after hemodialysis initiation. - BP trending down on HD -d/c amlodipine -decrease metoprolol succinate to once daily  Anemia of chronic kidney disease. - TSH within normal limits. -Iron saturation 26%, ferritin 47, folic acid 123456 - 0000000 elevated. - No active bleeding. - Transfused 1 unit on May of 9, due to significant weakness  - Monitor serial H&H--remains largely stable in the 9-10 range  Obesity hypoventilation syndrome. - Resume home CPAP.  Constipation - Currently resolved, Miralax / Senokot discontinued at patient's request since she was having diarrhea - diarrhea resolved, then developed constipation - Senokot for constipation. Add lactulose and bisacodyl  Impaired glucose tolerance -HbA1c--6.2 -previously on victoza prior to admission -CBGs controlled   Disposition Plan: SNF when accepted by HD center Family Communication:  Family at besideupdated  5/28  Consultants: Cardiology, Renal, vascular surgery  Code Status: FULL  Subjective: C/o constipation.  Patient denies fevers, chills, headache, chest pain, dyspnea, nausea, vomiting, diarrhea, abdominal pain, dysuria, hematuria   Objective: Filed Vitals:   10/27/15 1134 10/27/15 1655 10/27/15 2020 10/28/15 0820  BP: 113/51 104/72 108/73 110/49  Pulse: 89 86 88 84  Temp: 98 F (36.7 C) 98.7 F (37.1 C) 98.6 F (37 C) 98.7 F (37.1 C)  TempSrc: Oral Oral Oral Oral  Resp: 16 18 18 18   Height:      Weight:      SpO2: 99% 98% 99% 98%    Intake/Output Summary (Last 24 hours) at 10/28/15 1746 Last data filed at 10/28/15 0900  Gross per 24 hour  Intake    280 ml  Output      0 ml  Net    280 ml   Weight change:  Exam:   General:  Pt is alert, follows commands appropriately, not in acute distress  HEENT: No icterus, No thrush, No neck mass, [place]/AT  Cardiovascular: RRR, S1/S2, no rubs, no gallops  Respiratory: CTA bilaterally, no wheezing, no crackles, no rhonchi  Abdomen: Soft/+BS, non tender, non distended, no guarding  Extremities: 1+LE edema, No lymphangitis, No petechiae, No rashes, no synovitis   Data Reviewed: I have personally reviewed following labs and imaging studies Basic Metabolic Panel:  Recent Labs Lab 10/22/15 0416 10/23/15 0524 10/24/15 0610 10/25/15 0547 10/26/15 0502 10/27/15 0709  NA 135 136 137 137 137 134*  K 5.0 4.7 3.9 4.5 3.9 3.9  CL 98* 98* 98* 99* 99* 96*  CO2 26 27 29 28 28 28   GLUCOSE 123* 101* 80 80 90 96  BUN 43* 45* 18 27* 18 27*  CREATININE 8.57* 8.52* 5.18* 7.12* 5.24* 7.34*  CALCIUM 8.9 9.0 9.0 9.2 9.0 9.3  PHOS 3.2 3.6  --  3.6 2.6 3.0   Liver Function Tests:  Recent Labs Lab 10/22/15 0416 10/23/15 0524 10/25/15 0547 10/26/15 0502 10/27/15 0709  ALBUMIN 2.2* 2.2* 2.4* 2.4* 2.6*   No results for input(s): LIPASE, AMYLASE in the last 168 hours. No results for input(s): AMMONIA in the last 168  hours. Coagulation Profile: No results for input(s): INR, PROTIME in the last 168 hours. CBC:  Recent Labs Lab 10/22/15 1330 10/23/15 1515 10/24/15 0610 10/25/15 0830 10/27/15 0709  WBC 8.1 7.3 7.0 6.3 7.8  HGB 10.5* 9.2* 9.8* 10.0* 10.6*  HCT 33.3* 30.1* 32.0* 32.9* 34.8*  MCV 87.6 87.5 88.6 87.7 88.5  PLT 308 273 261 266 251   Cardiac Enzymes: No results for input(s): CKTOTAL, CKMB, CKMBINDEX, TROPONINI in the last 168 hours. BNP: Invalid input(s): POCBNP CBG:  Recent Labs Lab 10/27/15 1653 10/27/15 2011 10/28/15 0723 10/28/15 1210 10/28/15 1721  GLUCAP 83 118* 98 84 100*   HbA1C: No results for input(s): HGBA1C in the last 72 hours. Urine analysis: No results found for: COLORURINE, APPEARANCEUR, LABSPEC, PHURINE, GLUCOSEU, HGBUR, BILIRUBINUR, KETONESUR, PROTEINUR, UROBILINOGEN, NITRITE, LEUKOCYTESUR Sepsis Labs: @LABRCNTIP (procalcitonin:4,lacticidven:4) )No results found for this or any previous visit (from the past 240 hour(s)).   Scheduled Meds: . alteplase  2 mg Intracatheter Once  . aspirin EC  81 mg Oral Daily  . atorvastatin  40 mg Oral Daily  . bisacodyl  10 mg Rectal Once  . darbepoetin (ARANESP) injection - DIALYSIS  100 mcg Intravenous Q Thu-HD  . doxercalciferol  1 mcg Intravenous Q T,Th,Sa-HD  . febuxostat  40 mg Oral Daily  . heparin subcutaneous  5,000  Units Subcutaneous Q8H  . hydrocortisone   Rectal BID  . [START ON 10/29/2015] metoprolol succinate  50 mg Oral Daily  . multivitamin with minerals  1 tablet Oral Daily  . neomycin-bacitracin-polymyxin   Topical Daily  . polyethylene glycol  17 g Oral Daily  . senna-docusate  2 tablet Oral BID  . sevelamer carbonate  2,400 mg Oral TID WC  . sodium chloride flush  3 mL Intravenous Q12H   Continuous Infusions:   Procedures/Studies: Dg Chest 2 View  10/13/2015  CLINICAL DATA:  60 year old female with shortness of breath. EXAM: CHEST  2 VIEW COMPARISON:  Chest x-ray 10/09/2015. FINDINGS: Right  internal jugular Vas-Cath with tip terminating in the mid to distal superior vena cava. Lung volumes are slightly low. There is cephalization of the pulmonary vasculature and slight indistinctness of the interstitial markings suggestive of mild pulmonary edema. No pleural effusions. Mild cardiomegaly. Upper mediastinal contours are within normal limits allowing for patient rotation to the right and lordotic positioning. IMPRESSION: 1. The appearance of the chest suggests mild congestive heart failure, as above. Electronically Signed   By: Vinnie Langton M.D.   On: 10/13/2015 13:40   Dg Chest Port 1 View  10/26/2015  CLINICAL DATA:  Cough, hypertension EXAM: PORTABLE CHEST 1 VIEW COMPARISON:  10/13/2015 FINDINGS: Right dialysis catheter remains in place, unchanged. Heart is borderline in size. No confluent airspace opacities, effusions or edema. No acute bony abnormality. IMPRESSION: No active disease. Electronically Signed   By: Rolm Baptise M.D.   On: 10/26/2015 07:09   Dg Chest Port 1 View  10/09/2015  CLINICAL DATA:  Dialysis catheter insertion EXAM: PORTABLE CHEST 1 VIEW COMPARISON:  10/08/2015 FINDINGS: Right IJ dialysis catheter tips SVC RA junction. No pneumothorax or effusion. Stable cardiomegaly and central vascular congestion. No focal pneumonia, collapse or consolidation. Degenerative changes of the spine. Stable hilar prominence as before. IMPRESSION: Right IJ dialysis catheter tips SVC RA junction. Stable cardiomegaly and central vascular congestion No complicating feature. Electronically Signed   By: Jerilynn Mages.  Shick M.D.   On: 10/09/2015 16:25   Dg Chest Port 1 View  10/08/2015  CLINICAL DATA:  Cough EXAM: PORTABLE CHEST 1 VIEW COMPARISON:  10/05/2015 FINDINGS: Cardiomegaly again noted. Central mild vascular congestion without convincing pulmonary edema. Mild left basilar atelectasis. Right IJ central line is unchanged in position. IMPRESSION: Central mild vascular congestion without convincing  pulmonary edema. Mild left basilar atelectasis. Cardiomegaly again noted. Electronically Signed   By: Lahoma Crocker M.D.   On: 10/08/2015 08:16   Dg Chest Port 1 View  10/05/2015  CLINICAL DATA:  Central line placement. EXAM: PORTABLE CHEST 1 VIEW COMPARISON:  10/04/2015 FINDINGS: Right central line placement with the tip in the SVC. No pneumothorax. Heart is mildly enlarged. Mild vascular congestion. Mild interstitial prominence similar prior study. No acute bony abnormality. IMPRESSION: Right central line tip in the SVC. No pneumothorax. Otherwise no real change. Electronically Signed   By: Rolm Baptise M.D.   On: 10/05/2015 12:17   Dg Chest Port 1 View  10/04/2015  CLINICAL DATA:  Chest pain, shortness of breath, and cough for 1 week, worse tonight. EXAM: PORTABLE CHEST 1 VIEW COMPARISON:  09/28/2015 FINDINGS: Shallow inspiration. Cardiac enlargement with pulmonary vascular congestion. Mild interstitial pattern suggesting early interstitial edema. Similar appearance to previous study. No blunting of costophrenic angles. No pneumothorax. Mediastinal contours appear intact. IMPRESSION: Cardiac enlargement with pulmonary vascular congestion and mild interstitial edema similar to prior study. Electronically Signed   By:  Lucienne Capers M.D.   On: 10/04/2015 23:29   Dg Abd Portable 1v  10/15/2015  CLINICAL DATA:  Abdominal pain. EXAM: PORTABLE ABDOMEN - 1 VIEW COMPARISON:  10/14/2015 FINDINGS: Examination is limited due to patient's body habitus. Visualized abdomen demonstrates gas and stool in the colon. No small or large bowel distention. Calcification in the right upper quadrant consistent with gallstone. Surgical clips in the right lower quadrant. No radiopaque stones identified. IMPRESSION: Nonobstructive bowel gas pattern with scattered gas and stool in the colon. Cholelithiasis. Electronically Signed   By: Lucienne Capers M.D.   On: 10/15/2015 02:34   Dg Abd Portable 1v  10/14/2015  CLINICAL DATA:   Constipation EXAM: PORTABLE ABDOMEN - 1 VIEW COMPARISON:  CT abdomen 05/02/2009 FINDINGS: Image quality limited by large patient size Distended stomach.  Mildly distended colon.  Small bowel nondilated. Right upper quadrant calcification compatible gallstone as noted on CT. IMPRESSION: Limited exam.  Mild colonic ileus. Electronically Signed   By: Franchot Gallo M.D.   On: 10/14/2015 13:47    Markeise Mathews, DO  [facility] Pager [phone]  If 7PM-7AM, please contact night-coverage [email or web address] Password [email or web address] 10/28/2015, 5:46 PM   LOS: 23 days

## 2015-10-28 NOTE — Progress Notes (Signed)
2000 Patient transferred from bed to chair via rolling walker, with 2 person stand by assist. Patient tolerated well. Patient sat up in chair for 4 hours and returned to bed.  Ermalinda Memos, RN

## 2015-10-28 NOTE — Progress Notes (Signed)
Patient ID: [name], female   DOB: 17-Dec-1955, 60 y.o.   MRN: [id number]  [facility] Progress Note   Assessment/ Plan:   1. Acute hypoxic respiratory failure secondary to pulmonary edema/diastolic CHF: Improved with hemodialysis/ultrafiltration. Continues to tolerate exertion without significant hypoxia. 2. ESRD: Hemodialysis will be continued on a TTS Schedule. Recent infiltration of left brachiocephalic fistula deemed too deep and will need superficialization-- still with some swelling and tenderness at the site of recent infiltration. No acute HD needs at this time.  3. Anemia: Without overt loss, hemoglobin level is acceptable current dose of Aranesp. 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 Nutrition: With morbid obesity but hypoalbuminemia from progressive CKD/inflammatory component of TDC 6. Hypertension: Blood pressures acceptable-anticipate to improve further with ultrafiltration and hemodialysis.  She will need continued support from physical therapy to demonstrate ability for transfers and mobility prior to acceptance at an outpatient dialysis unit given the limited help available from staff at the center particularly with regards to bariatric care.  Subjective:   Reports to have tolerated dialysis well-currently denies any complaints including shortness of breath or chest pain. Motivated with PT progress. Was OOB to recliner yesterday   Objective:   BP 110/49 mmHg  Pulse 84  Temp(Src) 98.7 F (37.1 C) (Oral)  Resp 18  Ht 5\' 6"  (1.676 m)  Wt 156 kg (343 lb 14.7 oz)  BMI 55.54 kg/m2  SpO2 98%  Physical Exam: [id number] resting in bed CVS: Pulse regular rhythm, S1 and S2 normal Resp: Decreased breath sounds over bases, no rales/rhonchi Abd: Soft, obese, nontender Ext: Chronic lymphedema with some pitting edema  Labs: BMET  Recent Labs Lab 10/22/15 0416 10/23/15 0524  10/24/15 0610 10/25/15 0547 10/26/15 0502 10/27/15 0709  NA 135 136 137 137 137 134*  K 5.0 4.7 3.9 4.5 3.9 3.9  CL 98* 98* 98* 99* 99* 96*  CO2 26 27 29 28 28 28   GLUCOSE 123* 101* 80 80 90 96  BUN 43* 45* 18 27* 18 27*  CREATININE 8.57* 8.52* 5.18* 7.12* 5.24* 7.34*  CALCIUM 8.9 9.0 9.0 9.2 9.0 9.3  PHOS 3.2 3.6  --  3.6 2.6 3.0   CBC  Recent Labs Lab 10/23/15 1515 10/24/15 0610 10/25/15 0830 10/27/15 0709  WBC 7.3 7.0 6.3 7.8  HGB 9.2* 9.8* 10.0* 10.6*  HCT 30.1* 32.0* 32.9* 34.8*  MCV 87.5 88.6 87.7 88.5  PLT 273 261 266 251   Medications:    . alteplase  2 mg Intracatheter Once  . aspirin EC  81 mg Oral Daily  . atorvastatin  40 mg Oral Daily  . darbepoetin (ARANESP) injection - DIALYSIS  100 mcg Intravenous Q Thu-HD  . doxercalciferol  1 mcg Intravenous Q T,Th,Sa-HD  . febuxostat  40 mg Oral Daily  . heparin subcutaneous  5,000 Units Subcutaneous Q8H  . hydrocortisone   Rectal BID  . metoprolol succinate  50 mg Oral BID  . multivitamin with minerals  1 tablet Oral Daily  . neomycin-bacitracin-polymyxin   Topical Daily  . polyethylene glycol  17 g Oral Daily  . senna-docusate  2 tablet Oral BID  . sevelamer carbonate  2,400 mg Oral TID WC  . sodium chloride flush  3 mL Intravenous Q12H   Elmarie Shiley, MD 10/28/2015, 11:43 AM

## 2015-10-29 DIAGNOSIS — K921 Melena: Secondary | ICD-10-CM

## 2015-10-29 LAB — CBC
HCT: 36.1 % (ref 36.0–46.0)
Hemoglobin: 10.9 g/dL — ABNORMAL LOW (ref 12.0–15.0)
MCH: 27.1 pg (ref 26.0–34.0)
MCHC: 30.2 g/dL (ref 30.0–36.0)
MCV: 89.8 fL (ref 78.0–100.0)
PLATELETS: 292 10*3/uL (ref 150–400)
RBC: 4.02 MIL/uL (ref 3.87–5.11)
RDW: 16.4 % — AB (ref 11.5–15.5)
WBC: 8.3 10*3/uL (ref 4.0–10.5)

## 2015-10-29 LAB — GLUCOSE, CAPILLARY
GLUCOSE-CAPILLARY: 90 mg/dL (ref 65–99)
GLUCOSE-CAPILLARY: 110 mg/dL — AB (ref 65–99)
GLUCOSE-CAPILLARY: 112 mg/dL — AB (ref 65–99)
Glucose-Capillary: 108 mg/dL — ABNORMAL HIGH (ref 65–99)

## 2015-10-29 MED ORDER — CALCITRIOL 0.5 MCG PO CAPS
1.0000 ug | ORAL_CAPSULE | ORAL | Status: DC
  Administered 2015-11-03 – 2015-11-06 (×2): 1 ug via ORAL
  Filled 2015-10-29 (×2): qty 2

## 2015-10-29 MED ORDER — METOPROLOL SUCCINATE ER 25 MG PO TB24
25.0000 mg | ORAL_TABLET | Freq: Every day | ORAL | Status: DC
  Administered 2015-10-30 – 2015-10-31 (×2): 25 mg via ORAL
  Filled 2015-10-29 (×2): qty 1

## 2015-10-29 MED ORDER — CINACALCET HCL 30 MG PO TABS
60.0000 mg | ORAL_TABLET | Freq: Every day | ORAL | Status: DC
  Administered 2015-10-29 – 2015-11-05 (×7): 60 mg via ORAL
  Filled 2015-10-29 (×7): qty 2

## 2015-10-29 MED ORDER — CALCITRIOL 0.5 MCG PO CAPS: 1.0000 ug | ORAL_CAPSULE | ORAL | Status: DC

## 2015-10-29 NOTE — Progress Notes (Signed)
Physical Therapy Treatment Patient Details Name: MILLENA CALLINS MRN: [id number] DOB: Jul 02, 1955 Today's Date: 10/29/2015    History of Present Illness 60 year old female presenting with 10 days of progressive dyspnea. PMH is significant for CKD, HTN, T2DM, and seizure disorder    PT Comments    Continuing progress with functional mobility and activity tolerance; Goals met and updated  Follow Up Recommendations  SNF;Supervision/Assistance - 24 hour     Equipment Recommendations  Rolling walker with 5" wheels;3in1 (PT)    Recommendations for Other Services       Precautions / Restrictions Precautions Precautions: Fall Restrictions Weight Bearing Restrictions: No    Mobility  Bed Mobility               General bed mobility comments: not assessed  Transfers Overall transfer level: Needs assistance Equipment used: Rolling walker (2 wheeled) (bariatric) Transfers: Sit to/from Stand Sit to Stand: Min assist         General transfer comment: stood from recliner chair with Min A. Took a step with each foot back to chair prior to sitting.   Ambulation/Gait Ambulation/Gait assistance: Min guard Ambulation Distance (Feet): 40 Feet (with 2 standing rest breaks) Assistive device: Rolling walker (2 wheeled) Gait Pattern/deviations: Step-through pattern;Decreased step length - right;Decreased step length - left;Trunk flexed     General Gait Details: Cues to self--monitor for activiy tolerance; Better posture with today's walk with less need for propping elbows on RW   Stairs            Wheelchair Mobility    Modified Rankin (Stroke Patients Only)       Balance             Standing balance-Leahy Scale: Poor                      Cognition Arousal/Alertness: Awake/alert Behavior During Therapy: WFL for tasks assessed/performed Overall Cognitive Status: Within Functional Limits for tasks assessed                      Exercises       General Comments        Pertinent Vitals/Pain Pain Assessment: No/denies pain    Home Living                      Prior Function            PT Goals (current goals can now be found in the care plan section) Acute Rehab PT Goals Patient Stated Goal: not stated PT Goal Formulation: With patient Time For Goal Achievement: 11/12/15 Potential to Achieve Goals: Good Progress towards PT goals: Goals met and updated - see care plan    Frequency  Min 3X/week    PT Plan Current plan remains appropriate    Co-evaluation             End of Session Equipment Utilized During Treatment: Gait belt Activity Tolerance: Patient tolerated treatment well Patient left: in chair;with call bell/phone within reach     Time: 1434-1501 PT Time Calculation (min) (ACUTE ONLY): 27 min  Charges:  $Gait Training: 23-37 mins                    G Codes:      Quin Hoop 10/29/2015, 4:18 PM  Roney Marion, [place] Pager [phone] Office [phone]

## 2015-10-29 NOTE — Progress Notes (Signed)
PROGRESS NOTE  [name] [id number] DOB: [birth year] DOA: 10/04/2015 PCP: Dwan Bolt, MD Brief History:  Patient was admitted on 10/04/2015, with complaint of shortness of breath progressively worsening without any chest pain or cough or fever or chills, was found to have acute on chronic diastolic dysfunction with elevated troponin and elevated creatinine. Patient also had hyperkalemia with metabolic acidosis. Patient was treated conservatively with Lasix in the ER but later on transferred to [facility] on admission. Initial AV fistula hemodialysis was not successful and temporary hemodialysis catheter was placed. As per vascular surgery the AV fistula will be superficialized. Currently further plan is continue hemodialysis and arrange for outpatient discharge HD. Limiting factor in why the patient is still hospitalized due to difficulty finding an outpt HD center able to accommodate patient due to her deconditioning and impaired mobility and limited assistance she can be given at the outpt unit. She will need to demonstrate ability to engage in independent transfers.  Assessment/Plan: Acute hypoxic respiratory failure due to Acute pulmonary edema (HCC)  - Oxygenation is significantly improving with fluid removal, now fluid management per HD -now stable on RA -pulmonary edema more a function of renal failure rather than CHF  -10/26/15--discussed with cardiology--pt has not received any Entresto since admission and has preserved EF--hold entresto until f/u with Dr. Irish Lack  New ESRD - Left arm fistula placed in 07/25/2015. Unable to use at present due to being too deep - Tunneled HD catheter on 10/09/15. - Hemodialysis started on 5/5//17 via [facility] - AV fistula will require future procedures superficialization later and patient will need outpatient follow-up with vascular surgery. - Patient will need to be able to tolerate hemodialysis in chair in an outpatient  setting and demonstrate independent transfers-->doing better with only stand by assist per RN - overall weight since admission 412 [phone]  Hematochezia -10/29/2015 Patient developed small to moderate amount of hematochezia stool -Remains hemodynamically stable -Feb 2011 colonoscopy--diverticulosis, hyperplastic polyps -consulted [facility]--spoke with Dr. Cristina Gong -trend CBC--Hgb stable thus far -hold heparin [place] -hold ASA pending GI eval  Generalized fatigue and weakness with physical deconditioning. - Likely due to morbid obesity and prolonged hospital stay - TSH & cortisol level unremarkable. - Hemodynamically stable. Blood cultures no growth to date. - H&H improved after transfusion, improvement in weakness after transfusion. - Patient will need significant physical therapy. - As per CIR not a candidate for CIR and will need SNF. -10/29/15--doing better with transfers--doing better now withm min assist for transfers per PT  Elevated troponin. - Troponin was 11 on May 6 - 10/07/15 Echo-- EF-- 55-60%, no WMA; EKG does not show any acute ischemic changes. - Currently on aspirin and statin and beta blocker. - Cardiology was consulted and appreciate input, medical management for now -no chest pain  Acute encephalopathy. - resolved - Avoid sedating medications.  Essential hypertension. - Blood pressure getting stable after hemodialysis initiation. - BP trending down on HD -d/c amlodipine -decrease metoprolol succinate to once daily-->dose further decreased by nephrology  Anemia of chronic kidney disease. - TSH within normal limits. -Iron saturation 26%, ferritin 47, folic acid 123456 - 0000000 elevated. - No active bleeding. - Transfused 1 unit on May of 9, due to significant weakness  - Monitor serial H&H--remains largely stable in the 9-10 range  Obesity hypoventilation syndrome. - Resume home CPAP.  Constipation - Currently resolved, Miralax / Senokot discontinued at patient's  request since she was having  diarrhea - diarrhea resolved, then developed constipation - Senokot for constipation. Add lactulose and bisacodyl  Impaired glucose tolerance -HbA1c--6.2 -previously on victoza prior to admission -CBGs controlled   Disposition Plan: SNF when accepted by HD center Family Communication:  Family at besideupdated 5/28  Consultants: Cardiology, Renal, vascular surgery  Code Status: FULL  Subjective: Patient denies fevers, chills, headache, chest pain, dyspnea, nausea, vomiting, diarrhea, abdominal pain, dysuria, hematuria. Had small amount hematochezia this afternoon.   Objective: Filed Vitals:   10/28/15 0820 10/28/15 2046 10/29/15 0431 10/29/15 0742  BP: 110/49 94/75 124/81 116/70  Pulse: 84 82 94 99  Temp: 98.7 F (37.1 C) 98.2 F (36.8 C) 98 F (36.7 C) 97.4 F (36.3 C)  TempSrc: Oral Oral Oral Oral  Resp: 18 19 19 18   Height:      Weight:      SpO2: 98% 91% 92% 94%    Intake/Output Summary (Last 24 hours) at 10/29/15 1627 Last data filed at 10/29/15 1400  Gross per 24 hour  Intake    300 ml  Output      1 ml  Net    299 ml   Weight change:  Exam:   General:  Pt is alert, follows commands appropriately, not in acute distress  HEENT: No icterus, No thrush, No neck mass, [place]/AT  Cardiovascular: RRR, S1/S2, no rubs, no gallops  Respiratory: CTA bilaterally, no wheezing, no crackles, no rhonchi  Abdomen: Soft/+BS, non tender, non distended, no guarding  Extremities: 1+ LE edema, No lymphangitis, No petechiae, No rashes, no synovitis   Data Reviewed: I have personally reviewed following labs and imaging studies Basic Metabolic Panel:  Recent Labs Lab 10/23/15 0524 10/24/15 0610 10/25/15 0547 10/26/15 0502 10/27/15 0709  NA 136 137 137 137 134*  K 4.7 3.9 4.5 3.9 3.9  CL 98* 98* 99* 99* 96*  CO2 27 29 28 28 28   GLUCOSE 101* 80 80 90 96  BUN 45* 18 27* 18 27*  CREATININE 8.52* 5.18* 7.12* 5.24* 7.34*  CALCIUM 9.0  9.0 9.2 9.0 9.3  PHOS 3.6  --  3.6 2.6 3.0   Liver Function Tests:  Recent Labs Lab 10/23/15 0524 10/25/15 0547 10/26/15 0502 10/27/15 0709  ALBUMIN 2.2* 2.4* 2.4* 2.6*   No results for input(s): LIPASE, AMYLASE in the last 168 hours. No results for input(s): AMMONIA in the last 168 hours. Coagulation Profile: No results for input(s): INR, PROTIME in the last 168 hours. CBC:  Recent Labs Lab 10/23/15 1515 10/24/15 0610 10/25/15 0830 10/27/15 0709 10/29/15 1514  WBC 7.3 7.0 6.3 7.8 8.3  HGB 9.2* 9.8* 10.0* 10.6* 10.9*  HCT 30.1* 32.0* 32.9* 34.8* 36.1  MCV 87.5 88.6 87.7 88.5 89.8  PLT 273 261 266 251 292   Cardiac Enzymes: No results for input(s): CKTOTAL, CKMB, CKMBINDEX, TROPONINI in the last 168 hours. BNP: Invalid input(s): POCBNP CBG:  Recent Labs Lab 10/28/15 1210 10/28/15 1721 10/28/15 2031 10/29/15 0740 10/29/15 1223  GLUCAP 84 100* 127* 90 108*   HbA1C: No results for input(s): HGBA1C in the last 72 hours. Urine analysis: No results found for: COLORURINE, APPEARANCEUR, LABSPEC, PHURINE, GLUCOSEU, HGBUR, BILIRUBINUR, KETONESUR, PROTEINUR, UROBILINOGEN, NITRITE, LEUKOCYTESUR Sepsis Labs: @LABRCNTIP (procalcitonin:4,lacticidven:4) )No results found for this or any previous visit (from the past 240 hour(s)).   Scheduled Meds: . alteplase  2 mg Intracatheter Once  . aspirin EC  81 mg Oral Daily  . atorvastatin  40 mg Oral Daily  . [START ON 10/30/2015] calcitRIOL  1  mcg Oral Q T,Th,Sa-HD  . cinacalcet  60 mg Oral Q supper  . darbepoetin (ARANESP) injection - DIALYSIS  100 mcg Intravenous Q Thu-HD  . febuxostat  40 mg Oral Daily  . heparin subcutaneous  5,000 Units Subcutaneous Q8H  . [START ON 10/30/2015] metoprolol succinate  25 mg Oral QHS  . multivitamin with minerals  1 tablet Oral Daily  . neomycin-bacitracin-polymyxin   Topical Daily  . polyethylene glycol  17 g Oral Daily  . senna-docusate  2 tablet Oral BID  . sevelamer carbonate  2,400 mg  Oral TID WC  . sodium chloride flush  3 mL Intravenous Q12H   Continuous Infusions:   Procedures/Studies: Dg Chest 2 View  10/13/2015  CLINICAL DATA:  60 year old female with shortness of breath. EXAM: CHEST  2 VIEW COMPARISON:  Chest x-ray 10/09/2015. FINDINGS: Right internal jugular Vas-Cath with tip terminating in the mid to distal superior vena cava. Lung volumes are slightly low. There is cephalization of the pulmonary vasculature and slight indistinctness of the interstitial markings suggestive of mild pulmonary edema. No pleural effusions. Mild cardiomegaly. Upper mediastinal contours are within normal limits allowing for patient rotation to the right and lordotic positioning. IMPRESSION: 1. The appearance of the chest suggests mild congestive heart failure, as above. Electronically Signed   By: Vinnie Langton M.D.   On: 10/13/2015 13:40   Dg Chest Port 1 View  10/26/2015  CLINICAL DATA:  Cough, hypertension EXAM: PORTABLE CHEST 1 VIEW COMPARISON:  10/13/2015 FINDINGS: Right dialysis catheter remains in place, unchanged. Heart is borderline in size. No confluent airspace opacities, effusions or edema. No acute bony abnormality. IMPRESSION: No active disease. Electronically Signed   By: Rolm Baptise M.D.   On: 10/26/2015 07:09   Dg Chest Port 1 View  10/09/2015  CLINICAL DATA:  Dialysis catheter insertion EXAM: PORTABLE CHEST 1 VIEW COMPARISON:  10/08/2015 FINDINGS: Right IJ dialysis catheter tips SVC RA junction. No pneumothorax or effusion. Stable cardiomegaly and central vascular congestion. No focal pneumonia, collapse or consolidation. Degenerative changes of the spine. Stable hilar prominence as before. IMPRESSION: Right IJ dialysis catheter tips SVC RA junction. Stable cardiomegaly and central vascular congestion No complicating feature. Electronically Signed   By: Jerilynn Mages.  Shick M.D.   On: 10/09/2015 16:25   Dg Chest Port 1 View  10/08/2015  CLINICAL DATA:  Cough EXAM: PORTABLE CHEST 1 VIEW  COMPARISON:  10/05/2015 FINDINGS: Cardiomegaly again noted. Central mild vascular congestion without convincing pulmonary edema. Mild left basilar atelectasis. Right IJ central line is unchanged in position. IMPRESSION: Central mild vascular congestion without convincing pulmonary edema. Mild left basilar atelectasis. Cardiomegaly again noted. Electronically Signed   By: Lahoma Crocker M.D.   On: 10/08/2015 08:16   Dg Chest Port 1 View  10/05/2015  CLINICAL DATA:  Central line placement. EXAM: PORTABLE CHEST 1 VIEW COMPARISON:  10/04/2015 FINDINGS: Right central line placement with the tip in the SVC. No pneumothorax. Heart is mildly enlarged. Mild vascular congestion. Mild interstitial prominence similar prior study. No acute bony abnormality. IMPRESSION: Right central line tip in the SVC. No pneumothorax. Otherwise no real change. Electronically Signed   By: Rolm Baptise M.D.   On: 10/05/2015 12:17   Dg Chest Port 1 View  10/04/2015  CLINICAL DATA:  Chest pain, shortness of breath, and cough for 1 week, worse tonight. EXAM: PORTABLE CHEST 1 VIEW COMPARISON:  09/28/2015 FINDINGS: Shallow inspiration. Cardiac enlargement with pulmonary vascular congestion. Mild interstitial pattern suggesting early interstitial edema. Similar appearance to  previous study. No blunting of costophrenic angles. No pneumothorax. Mediastinal contours appear intact. IMPRESSION: Cardiac enlargement with pulmonary vascular congestion and mild interstitial edema similar to prior study. Electronically Signed   By: Lucienne Capers M.D.   On: 10/04/2015 23:29   Dg Abd Portable 1v  10/15/2015  CLINICAL DATA:  Abdominal pain. EXAM: PORTABLE ABDOMEN - 1 VIEW COMPARISON:  10/14/2015 FINDINGS: Examination is limited due to patient's body habitus. Visualized abdomen demonstrates gas and stool in the colon. No small or large bowel distention. Calcification in the right upper quadrant consistent with gallstone. Surgical clips in the right lower  quadrant. No radiopaque stones identified. IMPRESSION: Nonobstructive bowel gas pattern with scattered gas and stool in the colon. Cholelithiasis. Electronically Signed   By: Lucienne Capers M.D.   On: 10/15/2015 02:34   Dg Abd Portable 1v  10/14/2015  CLINICAL DATA:  Constipation EXAM: PORTABLE ABDOMEN - 1 VIEW COMPARISON:  CT abdomen 05/02/2009 FINDINGS: Image quality limited by large patient size Distended stomach.  Mildly distended colon.  Small bowel nondilated. Right upper quadrant calcification compatible gallstone as noted on CT. IMPRESSION: Limited exam.  Mild colonic ileus. Electronically Signed   By: Franchot Gallo M.D.   On: 10/14/2015 13:47    Jaid Quirion, DO  [facility] Pager [phone]  If 7PM-7AM, please contact night-coverage [email or web address] Password [email or web address] 10/29/2015, 4:27 PM   LOS: 24 days

## 2015-10-29 NOTE — Consult Note (Signed)
Referring Provider:  Dr. Shanon Brow Tat Primary Care Physician:  Dwan Bolt, MD Primary Gastroenterologist:  Dr. Paulita Fujita  Reason for Consultation:  Rectal bleeding  HPI: [name] is a 60 y.o. female with morbid obesity, new to dialysis, status post negative screening colonoscopy by Dr. Wonda Horner February 2011 (mild sigmoid diverticulosis present), has been in the hospital the past month, initially presenting with shortness of breath with acute on chronic diastolic dysfunction and azotemia. She suffers from chronic constipation and has been on a variety of laxatives. At baseline, she typically has a bowel movement once a week, which is hard in character and requires a lot of straining.  With that background, the patient had some blood with her bowel movement today, just a small amount, no associated abdominal pain. Her hemoglobin has actually increased by approximately 1 g over the past several days.   Past Medical History  Diagnosis Date  . Hypertension   . Kidney disease   . High cholesterol   . Shortness of breath dyspnea     with exertion  . H/O blood clots     many years ago in leg  . Sleep apnea     uses c-pap most of time  . Pneumonia   . Diabetes mellitus without complication ([place])     type 2  . Seizures ([place])     had one or two (more than 10-15 years ago)  . Arthritis   . Constipation     Past Surgical History  Procedure Laterality Date  . Abdominal hysterectomy    . Colonoscopy    . Av fistula placement Left 07/25/2015    Procedure: ARTERIOVENOUS (AV) FISTULA CREATION -LEFT BRACHIOCEPHALIC;  Surgeon: Conrad [place], MD;  Location: [facility];  Service: Vascular;  Laterality: Left;  . Insertion of dialysis catheter Right 10/09/2015    Procedure: INSERTION OF DIALYSIS CATHETER RIGHT INTERNAL JUGULAR ;  Surgeon: Angelia Mould, MD;  Location: [facility];  Service: Vascular;  Laterality: Right;    Prior to Admission medications   Medication Sig Start Date End Date  Taking? Authorizing Provider  amLODipine (NORVASC) 10 MG tablet Take 10 mg by mouth at bedtime.    Yes Historical Provider, MD  aspirin 81 MG tablet Take 81 mg by mouth daily.   Yes Historical Provider, MD  atorvastatin (LIPITOR) 40 MG tablet Take 40 mg by mouth daily.   Yes Historical Provider, MD  Cholecalciferol (VITAMIN D PO) Take 1 tablet by mouth daily.   Yes Historical Provider, MD  cloNIDine (CATAPRES) 0.3 MG tablet Take 0.3 mg by mouth 2 (two) times daily.   Yes Historical Provider, MD  febuxostat (ULORIC) 40 MG tablet Take 40 mg by mouth daily.   Yes Historical Provider, MD  furosemide (LASIX) 80 MG tablet Take 80 mg by mouth daily.   Yes Historical Provider, MD  Liraglutide (VICTOZA) 18 MG/3ML SOPN Inject 1.8 mg into the skin daily.    Yes Historical Provider, MD  metoprolol succinate (TOPROL-XL) 100 MG 24 hr tablet Take 100 mg by mouth 2 (two) times daily. Take with or immediately following a meal.   Yes Historical Provider, MD  Multiple Vitamins-Minerals (MULTIVITAMINS THER. W/MINERALS) TABS tablet Take 1 tablet by mouth daily.   Yes Historical Provider, MD  sacubitril-valsartan (ENTRESTO) 24-26 MG Take 1 tablet by mouth 2 (two) times daily.   Yes Historical Provider, MD    Current Facility-Administered Medications  Medication Dose Route Frequency Provider Last Rate Last Dose  . acetaminophen (  TYLENOL) tablet 650 mg  650 mg Oral Q6H PRN Rise Patience, MD   650 mg at 10/29/15 0442   Or  . acetaminophen (TYLENOL) suppository 650 mg  650 mg Rectal Q6H PRN Rise Patience, MD      . alteplase (CATHFLO ACTIVASE) injection 2 mg  2 mg Intracatheter Once Elmarie Shiley, MD      . atorvastatin (LIPITOR) tablet 40 mg  40 mg Oral Daily Rise Patience, MD   40 mg at 10/29/15 1101  . [START ON 10/30/2015] calcitRIOL (ROCALTROL) capsule 1 mcg  1 mcg Oral Q T,Th,Sa-HD Orson Eva, MD      . camphor-menthol [facility]) lotion   Topical PRN Orson Eva, MD      . cinacalcet (SENSIPAR) tablet 60  mg  60 mg Oral Q supper Mauricia Area, MD   60 mg at 10/29/15 1737  . Darbepoetin Alfa (ARANESP) injection 100 mcg  100 mcg Intravenous Q Thu-HD Orson Eva, MD   100 mcg at 10/25/15 0958  . febuxostat (ULORIC) tablet 40 mg  40 mg Oral Daily Rise Patience, MD   40 mg at 10/29/15 1100  . guaiFENesin-dextromethorphan (ROBITUSSIN DM) 100-10 MG/5ML syrup 5 mL  5 mL Oral Q4H PRN Dianne Dun, NP   5 mL at 10/29/15 1737  . hydrALAZINE (APRESOLINE) injection 10 mg  10 mg Intravenous Q4H PRN Rise Patience, MD      . hydrocortisone cream 1 % 1 application  1 application Topical TID PRN Gardiner Barefoot, NP   1 application at [id number]  . lip balm (BLISTEX) ointment   Topical PRN Lavina Hamman, MD      . Derrill Memo ON 10/30/2015] metoprolol succinate (TOPROL-XL) 24 hr tablet 25 mg  25 mg Oral QHS Mauricia Area, MD      . multivitamin with minerals tablet 1 tablet  1 tablet Oral Daily Rise Patience, MD   1 tablet at 10/29/15 1100  . neomycin-bacitracin-polymyxin (NEOSPORIN) ointment   Topical Daily Janece Canterbury, MD      . nitroGLYCERIN (NITROSTAT) SL tablet 0.4 mg  0.4 mg Sublingual Q5 min PRN Janece Canterbury, MD      . ondansetron [facility]) tablet 4 mg  4 mg Oral Q6H PRN Rise Patience, MD   4 mg at 10/11/15 1631   Or  . ondansetron (ZOFRAN) injection 4 mg  4 mg Intravenous Q6H PRN Rise Patience, MD   4 mg at 10/20/15 0443  . polyethylene glycol (MIRALAX / GLYCOLAX) packet 17 g  17 g Oral Daily Elmarie Shiley, MD   17 g at 10/29/15 1058  . senna-docusate (Senokot-S) tablet 2 tablet  2 tablet Oral BID Caren Griffins, MD   2 tablet at 10/29/15 1100  . sevelamer carbonate (RENVELA) tablet 2,400 mg  2,400 mg Oral TID WC Jamal Maes, MD   2,400 mg at 10/29/15 1737  . simethicone (MYLICON) chewable tablet 80 mg  80 mg Oral Q6H PRN Caren Griffins, MD   80 mg at 10/21/15 1606  . sodium chloride flush (NS) 0.9 % injection 3 mL  3 mL Intravenous Q12H Rise Patience, MD   3 mL at 10/29/15 1100    Allergies as of 10/04/2015  . (No Known Allergies)    Family History  Problem Relation Age of Onset  . Cancer Mother   . Hypertension Father   . Heart attack Sister   . Heart attack Paternal Aunt   .  Stroke Neg Hx     Social History   Social History  . Marital Status: Single    Spouse Name: N/A  . Number of Children: N/A  . Years of Education: N/A   Occupational History  . Not on file.   Social History Main Topics  . Smoking status: Never Smoker   . Smokeless tobacco: Never Used  . Alcohol Use: No  . Drug Use: No  . Sexual Activity: Not on file   Other Topics Concern  . Not on file   Social History Narrative    Review of Systems: See history of present illness  Physical Exam: Vital signs in last 24 hours: Temp:  [97.4 F (36.3 C)-98.2 F (36.8 C)] 97.8 F (36.6 C) (05/29 1646) Pulse Rate:  [82-99] 85 (05/29 1646) Resp:  [18-20] 20 (05/29 1646) BP: (94-391)/(51-81) 391/51 mmHg (05/29 1646) SpO2:  [91 %-100 %] 100 % (05/29 1646) FiO2 (%):  [3 %] 3 % (05/29 1646) Last BM Date: 10/28/15  Morbidly obese African-American female in no distress, cognitively intact, no evident focal neurologic deficits. Chest has some inspiratory wheezes or rhonchi especially in the left lung field. Heart normal, no murmur or arrhythmia. Abdomen morbidly obese with massive panniculus, but no overt tenderness.  Intake/Output from previous day: 05/28 0701 - 05/29 0700 In: 120 [P.O.:120] Out: 0  Intake/Output this shift: Total I/O In: 240 [P.O.:240] Out: 1 [Stool:1]  Lab Results:  Recent Labs  10/27/15 0709 10/29/15 1514  WBC 7.8 8.3  HGB 10.6* 10.9*  HCT 34.8* 36.1  PLT 251 292   BMET  Recent Labs  10/27/15 0709  NA 134*  K 3.9  CL 96*  CO2 28  GLUCOSE 96  BUN 27*  CREATININE 7.34*  CALCIUM 9.3   LFT  Recent Labs  10/27/15 0709  ALBUMIN 2.6*   PT/INR No results for input(s): LABPROT, INR in the last 72  hours.  Studies/Results: No results found.  Impression: Minimal hematochezia without drop in hemoglobin in a patient with multiple medical problems outlined above, and essentially negative colonoscopy except for diverticulosis 5 years ago.  Differential diagnosis includes hemorrhoids (most likely), stercoral ulceration, proctitis, neoplasia, vascular ectasia, or minimal diverticular bleeding.  Plan: Sigmoidoscopic evaluation tomorrow. Petra Kuba, purpose, and risks reviewed and patient agreeable. Since only a limited exam is anticipated, I do not anticipate the patient would require sedation. I will leave the decision about whether to administer a prep up to Dr. Paulita Fujita, who will be doing the exam.   LOS: 24 days   Keyla Milone V  10/29/2015, 6:22 PM   Pager [phone] If no answer or after 5 PM call [phone]

## 2015-10-29 NOTE — Care Management Important Message (Signed)
Important Message  Patient Details  Name: [name] MRN: [id number] Date of Birth: January 18, 1956   Medicare Important Message Given:  Yes    Jeannine Pennisi P Tobey Lippard 10/29/2015, 10:04 AM

## 2015-10-29 NOTE — Progress Notes (Signed)
Informed by NT of blood in patient stool. Observed stool in bedpan to have blood. MD notified. Will continue to monitor. Bartholomew Crews, RN

## 2015-10-29 NOTE — Progress Notes (Signed)
Occupational Therapy Treatment Patient Details Name: [name] MRN: [id number] DOB: Oct 18, 1955 Today's Date: 10/29/2015    History of present illness 60 year old female presenting with 10 days of progressive dyspnea. PMH is significant for CKD, HTN, T2DM, and seizure disorder   OT comments  Pt progressing. Pt performed ADLs in session. Feel pt will continue to benefit from acute OT to increase independence prior to d/c.   Follow Up Recommendations  SNF    Equipment Recommendations  Other (comment) (defer to next venue)    Recommendations for Other Services      Precautions / Restrictions Precautions Precautions: Fall Restrictions Weight Bearing Restrictions: No       Mobility Bed Mobility               General bed mobility comments: not assessed  Transfers Overall transfer level: Needs assistance Equipment used: Rolling walker (2 wheeled) (bariatric) Transfers: Sit to/from Stand Sit to Stand: Min assist         General transfer comment: stood from recliner chair with Min A. Took a step with each foot back to chair prior to sitting.     Balance             used RW for support for sit to stand transfer.                  ADL Overall ADL's : Needs assistance/impaired     Grooming: Set up;Sitting (applied powder and lotion)   Upper Body Bathing: Set up;Sitting (washed all but her back)   Lower Body Bathing: Set up;Sitting/lateral leans Lower Body Bathing Details (indicate cue type and reason): washed legs Upper Body Dressing : Set up;Supervision/safety;Sitting       Toilet Transfer: Minimal assistance;RW;Requires wide/bariatric (sit to stand from chair)           Functional mobility during ADLs: Minimal assistance;Rolling walker (sit to stand from chair)        Vision                     Perception     Praxis      Cognition  Lethargic  Behavior During Therapy: [facility] for tasks assessed/performed Overall Cognitive  Status: Within Functional Limits for tasks assessed                       Extremity/Trunk Assessment               Exercises     Shoulder Instructions       General Comments      Pertinent Vitals/ Pain       Pain Assessment: No/denies pain  Home Living                                          Prior Functioning/Environment              Frequency Min 2X/week     Progress Toward Goals  OT Goals(current goals can now be found in the care plan section)  Progress towards OT goals: Progressing toward goals-updated some goals and time for goal achievement  Acute Rehab OT Goals Patient Stated Goal: not stated OT Goal Formulation: With patient Time For Goal Achievement: 11/05/15 Potential to Achieve Goals: Good ADL Goals Pt Will Perform Lower Body Bathing: with min guard assist;sit to/from stand (with or without  AE) Pt Will Perform Lower Body Dressing: with min guard assist;sit to/from stand (with or without AE) Pt Will Transfer to Toilet: with min guard assist;stand pivot transfer;bedside commode Pt Will Perform Toileting - Clothing Manipulation and hygiene: with min assist;with adaptive equipment;sit to/from stand Pt/caregiver will Perform Home Exercise Program: Both right and left upper extremity;With theraband;With Supervision  Plan Discharge plan needs to be updated    Co-evaluation                 End of Session Equipment Utilized During Treatment: Rolling walker;Other (comment) (bariatric)   Activity Tolerance Patient tolerated treatment well   Patient Left in chair;with call bell/phone within reach   Nurse Communication          Time: [id number] OT Time Calculation (min): 16 min  Charges: OT General Charges $OT Visit: 1 Procedure OT Treatments $Self Care/Home Management : 8-22 mins  Benito Mccreedy OTR/L [id number] 10/29/2015, 4:23 PM

## 2015-10-29 NOTE — Progress Notes (Signed)
Subjective: Interval History: has complaints knows she needs to get up.  Objective: Vital signs in last 24 hours: Temp:  [97.4 F (36.3 C)-98.2 F (36.8 C)] 97.4 F (36.3 C) (05/29 0742) Pulse Rate:  [82-99] 99 (05/29 0742) Resp:  [18-19] 18 (05/29 0742) BP: (94-124)/(70-81) 116/70 mmHg (05/29 0742) SpO2:  [91 %-94 %] 94 % (05/29 0742) Weight change:   Intake/Output from previous day: 05/28 0701 - 05/29 0700 In: 120 [P.O.:120] Out: 0  Intake/Output this shift: Total I/O In: 120 [P.O.:120] Out: 0   General appearance: alert, appears stated age and morbidly obese Resp: diminished breath sounds bilaterally Cardio: S1, S2 normal and systolic murmur: holosystolic 2/6, blowing at apex GI: massive soft, pos bs Extremities: RIJ cath, LUA AVF resolving hematoma  Lab Results:  Recent Labs  10/27/15 0709  WBC 7.8  HGB 10.6*  HCT 34.8*  PLT 251   BMET:  Recent Labs  10/27/15 0709  NA 134*  K 3.9  CL 96*  CO2 28  GLUCOSE 96  BUN 27*  CREATININE 7.34*  CALCIUM 9.3   No results for input(s): PTH in the last 72 hours. Iron Studies: No results for input(s): IRON, TIBC, TRANSFERRIN, FERRITIN in the last 72 hours.  Studies/Results: No results found.  I have reviewed the patient's current medications.  Assessment/Plan: 1 ESRD HD on TTS, needs access revision 2 Anemia stable on esa 3 HPTH use po Calcitriol and sensipar 4 Massive obesity.  Limiting mobility 5 Severe debill 6 DM controlled P Sensipar, po calcitriol, esa, HD    LOS: 24 days   Tavarious Freel L 10/29/2015,11:29 AM

## 2015-10-30 ENCOUNTER — Encounter (HOSPITAL_COMMUNITY)
Admission: EM | Disposition: A | Discharge status: Skilled Nursing Facility | Payer: Self-pay | Source: Home / Self Care | Attending: Internal Medicine

## 2015-10-30 LAB — CBC
HEMATOCRIT: 33.4 % — AB (ref 36.0–46.0)
Hemoglobin: 10.2 g/dL — ABNORMAL LOW (ref 12.0–15.0)
MCH: 27.6 pg (ref 26.0–34.0)
MCHC: 30.5 g/dL (ref 30.0–36.0)
MCV: 90.3 fL (ref 78.0–100.0)
PLATELETS: 276 10*3/uL (ref 150–400)
RBC: 3.7 MIL/uL — ABNORMAL LOW (ref 3.87–5.11)
RDW: 16.6 % — AB (ref 11.5–15.5)
WBC: 9.1 10*3/uL (ref 4.0–10.5)

## 2015-10-30 LAB — RENAL FUNCTION PANEL
Albumin: 2.6 g/dL — ABNORMAL LOW (ref 3.5–5.0)
Anion gap: 10 (ref 5–15)
BUN: 33 mg/dL — AB (ref 6–20)
CHLORIDE: 99 mmol/L — AB (ref 101–111)
CO2: 29 mmol/L (ref 22–32)
CREATININE: 8.88 mg/dL — AB (ref 0.44–1.00)
Calcium: 9 mg/dL (ref 8.9–10.3)
GFR calc Af Amer: 5 mL/min — ABNORMAL LOW (ref >60)
GFR calc non Af Amer: 4 mL/min — ABNORMAL LOW (ref >60)
GLUCOSE: 86 mg/dL (ref 65–99)
POTASSIUM: 4.5 mmol/L (ref 3.5–5.1)
Phosphorus: 2.9 mg/dL (ref 2.5–4.6)
Sodium: 138 mmol/L (ref 135–145)

## 2015-10-30 LAB — GLUCOSE, CAPILLARY
Glucose-Capillary: 87 mg/dL (ref 65–99)
Glucose-Capillary: 78 mg/dL (ref 65–99)
Glucose-Capillary: 91 mg/dL (ref 65–99)

## 2015-10-30 SURGERY — SIGMOIDOSCOPY, FLEXIBLE

## 2015-10-30 MED ORDER — SODIUM CHLORIDE 0.9 % IV SOLN: 100.0000 mL | INTRAVENOUS | Status: DC | PRN

## 2015-10-30 MED ORDER — ALTEPLASE 2 MG IJ SOLR
INTRAMUSCULAR | Status: AC
  Administered 2015-10-30: 4 mg
  Filled 2015-10-30: qty 4

## 2015-10-30 MED ORDER — PENTAFLUOROPROP-TETRAFLUOROETH EX AERO: 1.0000 "application " | INHALATION_SPRAY | CUTANEOUS | Status: DC | PRN

## 2015-10-30 MED ORDER — LIDOCAINE HCL (PF) 1 % IJ SOLN: 5.0000 mL | INTRAMUSCULAR | Status: DC | PRN

## 2015-10-30 MED ORDER — HEPARIN SODIUM (PORCINE) 1000 UNIT/ML DIALYSIS: 100.0000 [IU]/kg | INTRAMUSCULAR | Status: DC | PRN

## 2015-10-30 MED ORDER — ALTEPLASE 2 MG IJ SOLR
2.0000 mg | Freq: Once | INTRAMUSCULAR | Status: AC | PRN
  Administered 2015-10-30: 4 mg

## 2015-10-30 MED ORDER — LIDOCAINE-PRILOCAINE 2.5-2.5 % EX CREA: 1.0000 "application " | TOPICAL_CREAM | CUTANEOUS | Status: DC | PRN

## 2015-10-30 MED ORDER — HEPARIN SODIUM (PORCINE) 1000 UNIT/ML DIALYSIS: 1000.0000 [IU] | INTRAMUSCULAR | Status: DC | PRN

## 2015-10-30 MED ORDER — POLYETHYLENE GLYCOL 3350 17 G PO PACK
17.0000 g | PACK | Freq: Two times a day (BID) | ORAL | Status: DC
  Administered 2015-10-30 – 2015-11-05 (×11): 17 g via ORAL
  Filled 2015-10-30 (×11): qty 1

## 2015-10-30 NOTE — Progress Notes (Signed)
      Needs superficialization of left av fistula S/P left brachiocephalic arteriovenous fistula placement by Dr. Bridgett Larsson 07/25/2015  Left arm with palpable thrill of AV fistula at anastomosis, to deep to palpate proximal fistula.  on Sonosite> 6 mm throughout, fistula deep >1 cm for >75% of length.  Plan for procedure later this week Superficialization of left BC fistula  Savhanna Sliva MAUREEN PA-C

## 2015-10-30 NOTE — Progress Notes (Signed)
Subjective: Saw patient in dialysis today. No abdominal pain. No further bleeding since that large stool yesterday. No dyschezia.  Objective: Vital signs in last 24 hours: Temp:  [97.8 F (36.6 C)-98 F (36.7 C)] 97.9 F (36.6 C) (05/30 0750) Pulse Rate:  [84-103] 103 (05/30 0802) Resp:  [18-20] 18 (05/30 0750) BP: (114-391)/(51-96) 145/89 mmHg (05/30 0802) SpO2:  [94 %-100 %] 95 % (05/30 0617) FiO2 (%):  [3 %] 3 % (05/29 1646) Weight change:  Last BM Date: 10/29/15  PE: GEN:  Morbidly obese, mildly short-of-breath appearing ABD:  Obese, protuberant, non-tender  Lab Results: CBC    Component Value Date/Time   WBC 8.3 10/29/2015 1514   RBC 4.02 10/29/2015 1514   HGB 10.9* 10/29/2015 1514   HCT 36.1 10/29/2015 1514   PLT 292 10/29/2015 1514   MCV 89.8 10/29/2015 1514   MCH 27.1 10/29/2015 1514   MCHC 30.2 10/29/2015 1514   RDW 16.4* 10/29/2015 1514   LYMPHSABS 1.4 10/17/2015 0511   MONOABS 1.1* 10/17/2015 0511   EOSABS 0.4 10/17/2015 0511   BASOSABS 0.0 10/17/2015 0511   CMP     Component Value Date/Time   NA 134* 10/27/2015 0709   K 3.9 10/27/2015 0709   CL 96* 10/27/2015 0709   CO2 28 10/27/2015 0709   GLUCOSE 96 10/27/2015 0709   BUN 27* 10/27/2015 0709   CREATININE 7.34* 10/27/2015 0709   CALCIUM 9.3 10/27/2015 0709   CALCIUM 7.7* 10/07/2015 0355   PROT 6.4* 10/17/2015 0511   ALBUMIN 2.6* 10/27/2015 0709   AST 23 10/17/2015 0511   ALT 28 10/17/2015 0511   ALKPHOS 117 10/17/2015 0511   BILITOT 0.4 10/17/2015 0511   GFRNONAA 5* 10/27/2015 0709   GFRAA 6* 10/27/2015 0709   Assessment:  1.  Solitary episode of hematochezia in association with hard stool.  Resolved.  No further ongoing bleeding. 2.  Constipation. 3.  Anemia.  Mild.  Normocytic.  Suspect related principally to renal disease, doubt GI-tract mediated.  Plan:  1.  Patient is due for repeat colonoscopy (last colonoscopy February 2011 with fair prep, hyperplastic polyp, few sigmoid  diverticulosis; she did not answer our letters last summer for a repeat evaluation.  If an intervention is done, it should be a complete colonoscopy. 2.  Given patient's lengthy hospitalization for unrelated reasons, patient and I would like to hold off on any further non-essential interventions, unless urgent.  We will accordingly advance diet and plan on outpatient follow-up for colonoscopy.  However, if patient has recurrent bleeding while an inpatient, please let us know, and we'll make arrangements for inpatient colonoscopy. 3.  Will start diet, please advance as tolerated. 4.  Intensify bowel regimen (she's on Senokot 2/day, and I've increased her Miralax to 17 g po bid), goal is one soft but formed stool at least every-other-day without straining or incomplete evacuation. 5.  [facility] will sign-off; we'll make arrangements for outpatient follow-up; thank you for the consultation; please call with questions.   Landry Dyke 10/30/2015, 8:38 AM   Pager [phone] If no answer or after 5 PM call [phone]

## 2015-10-30 NOTE — Clinical Social Work Note (Signed)
Faxed SCAT application to [facility] office.  Maralyn Witherell Givens, MSW, LCSW Licensed Clinical Social Worker [place] [phone]

## 2015-10-30 NOTE — Procedures (Signed)
I was present at this session.  I have reviewed the session itself and made appropriate changes.  Some issues with cath flow , see if can improve.  Not in chair as ordered, ?? broke  Gaelle Adriance L 5/30/20179:12 AM

## 2015-10-30 NOTE — Progress Notes (Addendum)
PROGRESS NOTE  [name] [id number] DOB: [birth year] DOA: 10/04/2015 PCP: Dwan Bolt, MD Brief History:  Patient was admitted on 10/04/2015, with complaint of shortness of breath progressively worsening without any chest pain or cough or fever or chills, was found to have acute on chronic diastolic dysfunction with elevated troponin and elevated creatinine. Patient also had hyperkalemia with metabolic acidosis. Patient was treated conservatively with Lasix in the ER but later on transferred to [facility] on admission. Initial AV fistula hemodialysis was not successful and temporary hemodialysis catheter was placed. As per vascular surgery the AV fistula will be superficialized. Currently further plan is continue hemodialysis and arrange for outpatient discharge HD. Limiting factor in why the patient is still hospitalized due to difficulty finding an outpt HD center able to accommodate patient due to her deconditioning and impaired mobility and limited assistance she can be given at the outpt unit. She will need to demonstrate ability to engage in independent transfers.  Assessment/Plan: Acute hypoxic respiratory failure due to Acute pulmonary edema (HCC)  - Oxygenation is significantly improving with fluid removal, now fluid management per HD -now stable on RA -pulmonary edema more a function of renal failure rather than CHF  -10/26/15--discussed with cardiology--pt has not received any Entresto since admission and has preserved EF--hold entresto until f/u with Dr. Irish Lack  New ESRD - Left arm fistula placed in 07/25/2015. Unable to use at present due to being too deep - Tunneled HD catheter on 10/09/15. - Hemodialysis started on 5/5//17 via [facility] - AV fistula will require future procedures superficialization later and patient will need outpatient follow-up with vascular surgery. - Patient will need to be able to tolerate hemodialysis in chair in an outpatient  setting and demonstrate independent transfers-->doing better with only stand by assist getting out of chair-->continue PT - overall weight since admission 412 >>>[id number]  Hematochezia -10/29/2015 Patient developed small to moderate amount of hematochezia stool -Remains hemodynamically stable -Feb 2011 colonoscopy--diverticulosis, hyperplastic polyps -trend CBC--Hgb stable thus far -hold heparin [place] -appreciate [facility]-->intensify bowel regimen, hold off endoscopy for now unless further hematochezia -holding ASA -10/30/15--BM without blood  Generalized fatigue and weakness with physical deconditioning. - Likely due to morbid obesity and prolonged hospital stay - TSH & cortisol level unremarkable. - Hemodynamically stable. Blood cultures no growth to date. - H&H improved after transfusion, improvement in weakness after transfusion. - Patient will need significant physical therapy. - As per CIR not a candidate for CIR and will need SNF. -10/29/15--doing better with transfers--doing better now withm min assist for transfers per PT  Elevated troponin. - Troponin was 11 on May 6 - 10/07/15 Echo-- EF-- 55-60%, no WMA; EKG does not show any acute ischemic changes. - Currently on aspirin and statin and beta blocker. - Cardiology was consulted and appreciate input, medical management for now -no chest pain  Acute encephalopathy. - resolved - Avoid sedating medications.  Essential hypertension. - Blood pressure getting stable after hemodialysis initiation. - BP trending down on HD -d/c amlodipine -decrease metoprolol succinate to once daily-->dose further decreased by nephrology  Anemia of chronic kidney disease. - TSH within normal limits. -Iron saturation 26%, ferritin 47, folic acid 123456 - 0000000 elevated. - No active bleeding. - Transfused 1 unit on May of 9, due to significant weakness  - Monitor serial H&H--remains largely stable in the 9-10 range  Obesity hypoventilation  syndrome. - Resume home CPAP.  Constipation - Currently resolved, Miralax /  Senokot discontinued at patient's request since she was having diarrhea - diarrhea resolved, then developed constipation - Senokot for constipation.  -miralax to bid  Impaired glucose tolerance -HbA1c--6.2 -previously on victoza prior to admission -CBGs controlled   Disposition Plan: SNF when accepted by HD center Family Communication:  Family at besideupdated 5/28  Consultants: Cardiology, Renal, vascular surgery  Code Status: FULL   Subjective: Patient denies fevers, chills, headache, chest pain, dyspnea, nausea, vomiting, diarrhea, abdominal pain, dysuria, hematuria   Objective: Filed Vitals:   10/30/15 1300 10/30/15 1327 10/30/15 1400 10/30/15 1744  BP: 80/43 95/56 99/64  123/66  Pulse: 60 65 95 99  Temp:  98.2 F (36.8 C)  97.8 F (36.6 C)  TempSrc:  Oral  Oral  Resp:  18 18 18   Height:      Weight:      SpO2:   100%     Intake/Output Summary (Last 24 hours) at 10/30/15 1850 Last data filed at 10/30/15 1745  Gross per 24 hour  Intake    600 ml  Output   2390 ml  Net  -1790 ml   Weight change:  Exam:   General:  Pt is alert, follows commands appropriately, not in acute distress  HEENT: No icterus, No thrush, No neck mass, [place]/AT  Cardiovascular: RRR, S1/S2, no rubs, no gallops  Respiratory: CTA bilaterally, no wheezing, no crackles, no rhonchi  Abdomen: Soft/+BS, non tender, non distended, no guarding  Extremities: 1 +LE edema, No lymphangitis, No petechiae, No rashes, no synovitis   Data Reviewed: I have personally reviewed following labs and imaging studies Basic Metabolic Panel:  Recent Labs Lab 10/24/15 0610 10/25/15 0547 10/26/15 0502 10/27/15 0709 10/30/15 0834  NA 137 137 137 134* 138  K 3.9 4.5 3.9 3.9 4.5  CL 98* 99* 99* 96* 99*  CO2 29 28 28 28 29   GLUCOSE 80 80 90 96 86  BUN 18 27* 18 27* 33*  CREATININE 5.18* 7.12* 5.24* 7.34* 8.88*   CALCIUM 9.0 9.2 9.0 9.3 9.0  PHOS  --  3.6 2.6 3.0 2.9   Liver Function Tests:  Recent Labs Lab 10/25/15 0547 10/26/15 0502 10/27/15 0709 10/30/15 0834  ALBUMIN 2.4* 2.4* 2.6* 2.6*   No results for input(s): LIPASE, AMYLASE in the last 168 hours. No results for input(s): AMMONIA in the last 168 hours. Coagulation Profile: No results for input(s): INR, PROTIME in the last 168 hours. CBC:  Recent Labs Lab 10/24/15 0610 10/25/15 0830 10/27/15 0709 10/29/15 1514 10/30/15 0834  WBC 7.0 6.3 7.8 8.3 9.1  HGB 9.8* 10.0* 10.6* 10.9* 10.2*  HCT 32.0* 32.9* 34.8* 36.1 33.4*  MCV 88.6 87.7 88.5 89.8 90.3  PLT 261 266 251 292 276   Cardiac Enzymes: No results for input(s): CKTOTAL, CKMB, CKMBINDEX, TROPONINI in the last 168 hours. BNP: Invalid input(s): POCBNP CBG:  Recent Labs Lab 10/29/15 1223 10/29/15 1641 10/29/15 2020 10/30/15 1429 10/30/15 1712  GLUCAP 108* 110* 112* 87 78   HbA1C: No results for input(s): HGBA1C in the last 72 hours. Urine analysis: No results found for: COLORURINE, APPEARANCEUR, LABSPEC, PHURINE, GLUCOSEU, HGBUR, BILIRUBINUR, KETONESUR, PROTEINUR, UROBILINOGEN, NITRITE, LEUKOCYTESUR Sepsis Labs: @LABRCNTIP (procalcitonin:4,lacticidven:4) )No results found for this or any previous visit (from the past 240 hour(s)).   Scheduled Meds: . alteplase  2 mg Intracatheter Once  . atorvastatin  40 mg Oral Daily  . calcitRIOL  1 mcg Oral Q T,Th,Sa-HD  . cinacalcet  60 mg Oral Q supper  . darbepoetin (ARANESP) injection -  DIALYSIS  100 mcg Intravenous Q Thu-HD  . febuxostat  40 mg Oral Daily  . metoprolol succinate  25 mg Oral QHS  . multivitamin with minerals  1 tablet Oral Daily  . neomycin-bacitracin-polymyxin   Topical Daily  . polyethylene glycol  17 g Oral BID  . senna-docusate  2 tablet Oral BID  . sevelamer carbonate  2,400 mg Oral TID WC  . sodium chloride flush  3 mL Intravenous Q12H   Continuous Infusions:   Procedures/Studies: Dg  Chest 2 View  10/13/2015  CLINICAL DATA:  60 year old female with shortness of breath. EXAM: CHEST  2 VIEW COMPARISON:  Chest x-ray 10/09/2015. FINDINGS: Right internal jugular Vas-Cath with tip terminating in the mid to distal superior vena cava. Lung volumes are slightly low. There is cephalization of the pulmonary vasculature and slight indistinctness of the interstitial markings suggestive of mild pulmonary edema. No pleural effusions. Mild cardiomegaly. Upper mediastinal contours are within normal limits allowing for patient rotation to the right and lordotic positioning. IMPRESSION: 1. The appearance of the chest suggests mild congestive heart failure, as above. Electronically Signed   By: Vinnie Langton M.D.   On: 10/13/2015 13:40   Dg Chest Port 1 View  10/26/2015  CLINICAL DATA:  Cough, hypertension EXAM: PORTABLE CHEST 1 VIEW COMPARISON:  10/13/2015 FINDINGS: Right dialysis catheter remains in place, unchanged. Heart is borderline in size. No confluent airspace opacities, effusions or edema. No acute bony abnormality. IMPRESSION: No active disease. Electronically Signed   By: Rolm Baptise M.D.   On: 10/26/2015 07:09   Dg Chest Port 1 View  10/09/2015  CLINICAL DATA:  Dialysis catheter insertion EXAM: PORTABLE CHEST 1 VIEW COMPARISON:  10/08/2015 FINDINGS: Right IJ dialysis catheter tips SVC RA junction. No pneumothorax or effusion. Stable cardiomegaly and central vascular congestion. No focal pneumonia, collapse or consolidation. Degenerative changes of the spine. Stable hilar prominence as before. IMPRESSION: Right IJ dialysis catheter tips SVC RA junction. Stable cardiomegaly and central vascular congestion No complicating feature. Electronically Signed   By: Jerilynn Mages.  Shick M.D.   On: 10/09/2015 16:25   Dg Chest Port 1 View  10/08/2015  CLINICAL DATA:  Cough EXAM: PORTABLE CHEST 1 VIEW COMPARISON:  10/05/2015 FINDINGS: Cardiomegaly again noted. Central mild vascular congestion without convincing  pulmonary edema. Mild left basilar atelectasis. Right IJ central line is unchanged in position. IMPRESSION: Central mild vascular congestion without convincing pulmonary edema. Mild left basilar atelectasis. Cardiomegaly again noted. Electronically Signed   By: Lahoma Crocker M.D.   On: 10/08/2015 08:16   Dg Chest Port 1 View  10/05/2015  CLINICAL DATA:  Central line placement. EXAM: PORTABLE CHEST 1 VIEW COMPARISON:  10/04/2015 FINDINGS: Right central line placement with the tip in the SVC. No pneumothorax. Heart is mildly enlarged. Mild vascular congestion. Mild interstitial prominence similar prior study. No acute bony abnormality. IMPRESSION: Right central line tip in the SVC. No pneumothorax. Otherwise no real change. Electronically Signed   By: Rolm Baptise M.D.   On: 10/05/2015 12:17   Dg Chest Port 1 View  10/04/2015  CLINICAL DATA:  Chest pain, shortness of breath, and cough for 1 week, worse tonight. EXAM: PORTABLE CHEST 1 VIEW COMPARISON:  09/28/2015 FINDINGS: Shallow inspiration. Cardiac enlargement with pulmonary vascular congestion. Mild interstitial pattern suggesting early interstitial edema. Similar appearance to previous study. No blunting of costophrenic angles. No pneumothorax. Mediastinal contours appear intact. IMPRESSION: Cardiac enlargement with pulmonary vascular congestion and mild interstitial edema similar to prior study. Electronically Signed  By: Lucienne Capers M.D.   On: 10/04/2015 23:29   Dg Abd Portable 1v  10/15/2015  CLINICAL DATA:  Abdominal pain. EXAM: PORTABLE ABDOMEN - 1 VIEW COMPARISON:  10/14/2015 FINDINGS: Examination is limited due to patient's body habitus. Visualized abdomen demonstrates gas and stool in the colon. No small or large bowel distention. Calcification in the right upper quadrant consistent with gallstone. Surgical clips in the right lower quadrant. No radiopaque stones identified. IMPRESSION: Nonobstructive bowel gas pattern with scattered gas and stool  in the colon. Cholelithiasis. Electronically Signed   By: Lucienne Capers M.D.   On: 10/15/2015 02:34   Dg Abd Portable 1v  10/14/2015  CLINICAL DATA:  Constipation EXAM: PORTABLE ABDOMEN - 1 VIEW COMPARISON:  CT abdomen 05/02/2009 FINDINGS: Image quality limited by large patient size Distended stomach.  Mildly distended colon.  Small bowel nondilated. Right upper quadrant calcification compatible gallstone as noted on CT. IMPRESSION: Limited exam.  Mild colonic ileus. Electronically Signed   By: Franchot Gallo M.D.   On: 10/14/2015 13:47    Vennessa Affinito, DO  [facility] Pager [phone]  If 7PM-7AM, please contact night-coverage [email or web address] Password [email or web address] 10/30/2015, 6:50 PM   LOS: 25 days

## 2015-10-30 NOTE — Progress Notes (Signed)
Subjective: Interval History: has no complaint .  Objective: Vital signs in last 24 hours: Temp:  [97.8 F (36.6 C)-98 F (36.7 C)] 97.9 F (36.6 C) (05/30 0750) Pulse Rate:  [84-103] 94 (05/30 0830) Resp:  [18-20] 18 (05/30 0750) BP: (114-391)/(51-96) 128/95 mmHg (05/30 0830) SpO2:  [94 %-100 %] 95 % (05/30 0617) FiO2 (%):  [3 %] 3 % (05/29 1646) Weight change:   Intake/Output from previous day: 05/29 0701 - 05/30 0700 In: 360 [P.O.:360] Out: 1 [Stool:1] Intake/Output this shift:    General appearance: alert, cooperative and morbidly obese Resp: diminished breath sounds bilaterally Chest wall: RIJ cath Cardio: RIJ cath GI: massive, pos bs, liver down 7 cm Extremities: AVF R arm resolving hematoma  Lab Results:  Recent Labs  10/29/15 1514 10/30/15 0834  WBC 8.3 9.1  HGB 10.9* 10.2*  HCT 36.1 33.4*  PLT 292 276   BMET:  Recent Labs  10/30/15 0834  NA 138  K 4.5  CL 99*  CO2 29  GLUCOSE 86  BUN 33*  CREATININE 8.88*  CALCIUM 9.0   No results for input(s): PTH in the last 72 hours. Iron Studies: No results for input(s): IRON, TIBC, TRANSFERRIN, FERRITIN in the last 72 hours.  Studies/Results: No results found.  I have reviewed the patient's current medications.  Assessment/Plan: 1 ESRD for HD, needs to be up in chair. Some issues. Not outpatient candidate until can transfer and have chair adequate 2 DM controlled 3 anemia stable 4 HPTH vit D, sensipar 5 Massive obesity needs to be dealt with  ? Bariatrics 6 Access need fistula superficialized P HD, esa, Vit D, sensipar, VVS to eval, ?? Bariatrics.    LOS: 25 days   [name] 10/30/2015,9:13 AM

## 2015-10-31 DIAGNOSIS — E1121 Type 2 diabetes mellitus with diabetic nephropathy: Secondary | ICD-10-CM

## 2015-10-31 DIAGNOSIS — K921 Melena: Secondary | ICD-10-CM

## 2015-10-31 DIAGNOSIS — Z992 Dependence on renal dialysis: Secondary | ICD-10-CM

## 2015-10-31 DIAGNOSIS — N186 End stage renal disease: Secondary | ICD-10-CM

## 2015-10-31 LAB — GLUCOSE, CAPILLARY
Glucose-Capillary: 84 mg/dL (ref 65–99)
Glucose-Capillary: 92 mg/dL (ref 65–99)
Glucose-Capillary: 82 mg/dL (ref 65–99)
Glucose-Capillary: 80 mg/dL (ref 65–99)
Glucose-Capillary: 103 mg/dL — ABNORMAL HIGH (ref 65–99)

## 2015-10-31 NOTE — Progress Notes (Signed)
PROGRESS NOTE  [name]  [id number] DOB: 1955/07/06 DOA: 10/04/2015 PCP: Dwan Bolt, MD  Brief Narrative:  Patient was admitted on 10/04/2015, with complaint of shortness of breath progressively worsening without any chest pain or cough or fever or chills, was found to have acute on chronic diastolic dysfunction with elevated troponin and elevated creatinine. Patient also had hyperkalemia with metabolic acidosis. Patient was treated conservatively with Lasix in the ER but later on transferred to [facility] on admission. Initial AV fistula hemodialysis was not successful and temporary hemodialysis catheter was placed. As per vascular surgery the AV fistula will be superficialized. Currently further plan is continue hemodialysis and arrange for outpatient discharge HD. Limiting factor in why the patient is still hospitalized due to difficulty finding an outpt HD center able to accommodate patient due to her size. Her strength has been improving, although she will need SNF at discharge.    Assessment & Plan:   Principal Problem:   Acute pulmonary edema (HCC) Active Problems:   Morbid obesity ([place])   Renal failure (ARF), acute on chronic (HCC)   Anemia in chronic kidney disease   Type 2 diabetes mellitus with renal complication (HCC)   Hypertension   Elevated troponin   Hypoglycemia   Occult blood positive stool   Chronic anemia   ESRD on dialysis ([place])   Acute respiratory failure with hypoxia (HCC)   Constipation   Hematochezia   Acute hypoxic respiratory failure due to Acute pulmonary edema (HCC)  - Oxygenation improved with fluid removal with HD - stable on RA -pulmonary edema more a function of renal failure rather than CHF  -10/26/15--discussed with cardiology--pt has not received any Entresto since admission and has preserved EF--hold entresto until f/u with Dr. Irish Lack  New ESRD - Left arm fistula placed in 07/25/2015. Unable to use at present due to  being too deep - Tunneled HD catheter on 10/09/15. - Hemodialysis started on 5/5//17 via [facility] - AV fistula to be revised on 6/1  Hematochezia -10/29/2015 Patient developed small to moderate amount of hematochezia stool -Remains hemodynamically stable -Feb 2011 colonoscopy--diverticulosis, hyperplastic polyps -trend CBC--Hgb stable thus far -hold heparin [place] -appreciate [facility]-->intensify bowel regimen, hold off endoscopy for now unless further hematochezia -holding ASA -10/30/15--BM without blood  Generalized fatigue and weakness with physical deconditioning. - Likely due to morbid obesity and prolonged hospital stay - TSH & cortisol level unremarkable. - Hemodynamically stable. Blood cultures no growth to date. - H&H improved after transfusion, improvement in weakness after transfusion. - Patient will need significant physical therapy. - As per CIR not a candidate for CIR and will need SNF. -10/29/15--doing better with transfers--doing better now withm min assist for transfers per PT  Elevated troponin (false positive of 11) - Troponin was 11 on May 6 - 10/07/15 Echo-- EF-- 55-60%, no WMA; EKG does not show any acute ischemic changes. - Currently on aspirin and statin and beta blocker. - Cardiology was consulted and appreciate input, medical management for now  Acute encephalopathy. - resolved - Avoid sedating medications.  Essential hypertension. - Blood pressure getting stable after hemodialysis initiation. - BP trending down on HD -d/c amlodipine -decrease metoprolol succinate to once daily-->dose further decreased by nephrology  Anemia of chronic kidney disease. - TSH within normal limits. -Iron saturation 26%, ferritin 47, folic acid 123456 - 0000000 elevated. - No active bleeding. - Transfused 1 unit on May of 9, due to significant weakness  - Monitor serial H&H--remains largely stable in  the 9-10 range  Obesity hypoventilation syndrome. - Resume home  CPAP.  Constipation - Currently resolved, Miralax / Senokot discontinued at patient's request since she was having diarrhea - diarrhea resolved, then developed constipation - Senokot for constipation.  -miralax to bid  Impaired glucose tolerance -HbA1c--6.2 -previously on victoza prior to admission -CBGs controlled  DVT prophylaxis:  SCD Code Status:  full Family Communication:  Patient alone Disposition Plan:  Having difficulty finding an HD center that can accommodate a bariatric chair.     Consultants:   Nephrology  Cardiology  Vascular surgery  Gastroenterology  Wound care  Procedures:  Started HD  Antimicrobials:   none    Subjective:  Feeling well.  Would like to sit up in chair some today.   Objective: Filed Vitals:   10/31/15 0613 10/31/15 0753 10/31/15 1056 10/31/15 1620  BP: 131/82 99/43  103/61  Pulse:  90  94  Temp: 98.2 F (36.8 C) 98.9 F (37.2 C)  98 F (36.7 C)  TempSrc: Oral Oral  Oral  Resp: 18 17  16   Height:      Weight:   150.821 kg (332 lb 8 oz)   SpO2: 98% 95%  98%    Intake/Output Summary (Last 24 hours) at 10/31/15 1657 Last data filed at 10/31/15 1413  Gross per 24 hour  Intake    900 ml  Output      0 ml  Net    900 ml   Filed Weights   10/25/15 1217 10/25/15 2059 10/31/15 1056  Weight: 160 kg (352 lb 11.8 oz) 156 kg (343 lb 14.7 oz) 150.821 kg (332 lb 8 oz)    Examination:  General exam:  Adult female.  No acute distress.  HEENT:  NCAT, MMM Respiratory system: Clear to auscultation bilaterally Cardiovascular system: Regular rate and rhythm, normal S1/S2. No murmurs, rubs, gallops or clicks.  Warm extremities Gastrointestinal system: Normal active bowel sounds, soft, nondistended, nontender. MSK:  Normal tone and bulk, no lower extremity edema Neuro:  Grossly intact    Data Reviewed: I have personally reviewed following labs and imaging studies  CBC:  Recent Labs Lab 10/25/15 0830 10/27/15 0709  10/29/15 1514 10/30/15 0834  WBC 6.3 7.8 8.3 9.1  HGB 10.0* 10.6* 10.9* 10.2*  HCT 32.9* 34.8* 36.1 33.4*  MCV 87.7 88.5 89.8 90.3  PLT 266 251 292 [id number]   Basic Metabolic Panel:  Recent Labs Lab 10/25/15 0547 10/26/15 0502 10/27/15 0709 10/30/15 0834  NA 137 137 134* 138  K 4.5 3.9 3.9 4.5  CL 99* 99* 96* 99*  CO2 28 28 28 29   GLUCOSE 80 90 96 86  BUN 27* 18 27* 33*  CREATININE 7.12* 5.24* 7.34* 8.88*  CALCIUM 9.2 9.0 9.3 9.0  PHOS 3.6 2.6 3.0 2.9   GFR: Estimated Creatinine Clearance: 10.5 mL/min (by C-G formula based on Cr of 8.88). Liver Function Tests:  Recent Labs Lab 10/25/15 0547 10/26/15 0502 10/27/15 0709 10/30/15 0834  ALBUMIN 2.4* 2.4* 2.6* 2.6*   No results for input(s): LIPASE, AMYLASE in the last 168 hours. No results for input(s): AMMONIA in the last 168 hours. Coagulation Profile: No results for input(s): INR, PROTIME in the last 168 hours. Cardiac Enzymes: No results for input(s): CKTOTAL, CKMB, CKMBINDEX, TROPONINI in the last 168 hours. BNP (last 3 results) No results for input(s): PROBNP in the last 8760 hours. HbA1C: No results for input(s): HGBA1C in the last 72 hours. CBG:  Recent Labs Lab 10/30/15 1429 10/30/15  1712 10/30/15 1951 10/31/15 0751 10/31/15 1207  GLUCAP 87 78 91 84 92   Lipid Profile: No results for input(s): CHOL, HDL, LDLCALC, TRIG, CHOLHDL, LDLDIRECT in the last 72 hours. Thyroid Function Tests: No results for input(s): TSH, T4TOTAL, FREET4, T3FREE, THYROIDAB in the last 72 hours. Anemia Panel: No results for input(s): VITAMINB12, FOLATE, FERRITIN, TIBC, IRON, RETICCTPCT in the last 72 hours. Urine analysis: No results found for: COLORURINE, APPEARANCEUR, LABSPEC, PHURINE, GLUCOSEU, HGBUR, BILIRUBINUR, KETONESUR, PROTEINUR, UROBILINOGEN, NITRITE, LEUKOCYTESUR Sepsis Labs: @LABRCNTIP (procalcitonin:4,lacticidven:4)  )No results found for this or any previous visit (from the past 240 hour(s)).    Radiology  Studies: No results found.   Scheduled Meds: . alteplase  2 mg Intracatheter Once  . atorvastatin  40 mg Oral Daily  . calcitRIOL  1 mcg Oral Q T,Th,Sa-HD  . cinacalcet  60 mg Oral Q supper  . darbepoetin (ARANESP) injection - DIALYSIS  100 mcg Intravenous Q Thu-HD  . febuxostat  40 mg Oral Daily  . metoprolol succinate  25 mg Oral QHS  . multivitamin with minerals  1 tablet Oral Daily  . neomycin-bacitracin-polymyxin   Topical Daily  . polyethylene glycol  17 g Oral BID  . senna-docusate  2 tablet Oral BID  . sevelamer carbonate  2,400 mg Oral TID WC  . sodium chloride flush  3 mL Intravenous Q12H   Continuous Infusions:    LOS: 26 days    Time spent: 30 min    Laura Canterbury, MD [facility] Pager [phone]  If 7PM-7AM, please contact night-coverage [email or web address] Password [facility] 10/31/2015, 4:57 PM

## 2015-10-31 NOTE — Progress Notes (Signed)
Physical Therapy Treatment Patient Details Name: [name] MRN: [id number] DOB: 1956/04/30 Today's Date: 10/31/2015    History of Present Illness 60 year old female presenting with 10 days of progressive dyspnea. Now new to HD;  PMH is significant for CKD, HTN, T2DM, and seizure disorder    PT Comments    Continuing excellent effort and progress with functional mobility and activity tolerance; Walked 60 feet today! Given this and anticipated progress, it is likely she would be able to handle transfers in and out of wheelchair and transfers in and out of HD chair; the bari recliner on this floor is a good fit for [place], and I believe they have an analogous wide HD chair at [employer]   Follow Up Recommendations  SNF;Supervision/Assistance - 24 hour     Equipment Recommendations  Rolling walker with 5" wheels;3in1 (PT) (bari)    Recommendations for Other Services       Precautions / Restrictions Precautions Precautions: Fall Restrictions Weight Bearing Restrictions: No    Mobility  Bed Mobility               General bed mobility comments: Did not perform, but she reports bed mobiltiy is getting easier  Transfers Overall transfer level: Needs assistance Equipment used: Rolling walker (2 wheeled) (bariatric) Transfers: Sit to/from Stand Sit to Stand: Min guard         General transfer comment: Dependent on momentum to stand, but not needing phsyical power-up assist; decr control with stand to sit due to fatigue  Ambulation/Gait Ambulation/Gait assistance: Min guard (Second person to push chair to boost confidence and encourage more distance; it will likely be possiblel to guard pt and pull chiar Plus 1) Ambulation Distance (Feet): 60 Feet (with 2 standing rest breaks) Assistive device: Rolling walker (2 wheeled) Gait Pattern/deviations: Step-through pattern     General Gait Details: Cues to self--monitor for activiy tolerance; Continuing improving  posture with today's walk with less need for propping elbows on RW   Stairs            Wheelchair Mobility    Modified Rankin (Stroke Patients Only)       Balance             Standing balance-Leahy Scale: Poor (approaching Fair)                      Cognition Arousal/Alertness: Awake/alert Behavior During Therapy: WFL for tasks assessed/performed Overall Cognitive Status: Within Functional Limits for tasks assessed                      Exercises      General Comments        Pertinent Vitals/Pain Pain Assessment: No/denies pain    Home Living Family/patient expects to be discharged to:: Private residence Living Arrangements: Other relatives                  Prior Function            PT Goals (current goals can now be found in the care plan section) Acute Rehab PT Goals Patient Stated Goal: not stated PT Goal Formulation: With patient Time For Goal Achievement: 11/12/15 Potential to Achieve Goals: Good Progress towards PT goals: Progressing toward goals    Frequency  Min 3X/week    PT Plan Current plan remains appropriate    Co-evaluation             End of Session Equipment  Utilized During Treatment: Gait belt Activity Tolerance: Patient tolerated treatment well Patient left: in chair;with call bell/phone within reach     Time: 1205-1220 PT Time Calculation (min) (ACUTE ONLY): 15 min  Charges:  $Gait Training: 8-22 mins                    G Codes:      Quin Hoop 10/31/2015, 1:54 PM  Roney Marion, [place] Pager [phone] Office [phone]

## 2015-10-31 NOTE — Care Management Important Message (Signed)
Important Message  Patient Details  Name: [name] MRN: [id number] Date of Birth: 15-Sep-1955   Medicare Important Message Given:  Yes    [name] 10/31/2015, 9:28 AM

## 2015-10-31 NOTE — Consult Note (Signed)
   [facility] CM Inpatient Consult   10/31/2015  PAULETTA RISCH 12-15-1955 [id number]   [facility] Care Management follow up. Spoke with inpatient RNCM to confirm discharge plan remains SNF. No identifiable [facility] Care Management needs at this time.  Marthenia Rolling, MSN-Ed, RN,BSN [facility] Liaison [phone]

## 2015-10-31 NOTE — Progress Notes (Signed)
           Needs superficialization of left av fistula S/P left brachiocephalic arteriovenous fistula placement by Dr. Bridgett Larsson 07/25/2015  Plan NPO  Left BV fistula transposition 11/01/2015 Dr. Bridgett Larsson

## 2015-10-31 NOTE — Progress Notes (Signed)
Subjective: Interval History: has no complaint, has been helping with transfers and taking steps.  Objective: Vital signs in last 24 hours: Temp:  [97.8 F (36.6 C)-98.9 F (37.2 C)] 98.9 F (37.2 C) (05/31 0753) Pulse Rate:  [60-102] 90 (05/31 0753) Resp:  [17-18] 17 (05/31 0753) BP: (69-131)/(38-83) 99/43 mmHg (05/31 0753) SpO2:  [95 %-100 %] 95 % (05/31 0753) Weight change:   Intake/Output from previous day: 05/30 0701 - 05/31 0700 In: 660 [P.O.:660] Out: 2390  Intake/Output this shift:    General appearance: alert, cooperative and morbidly obese Resp: diminished breath sounds bilaterally Chest wall: R IJ PC Cardio: S1, S2 normal and systolic murmur: holosystolic 2/6, blowing at apex GI: massive, pos bs,  Extremities: AVF RUA deep  Lab Results:  Recent Labs  10/29/15 1514 10/30/15 0834  WBC 8.3 9.1  HGB 10.9* 10.2*  HCT 36.1 33.4*  PLT 292 276   BMET:  Recent Labs  10/30/15 0834  NA 138  K 4.5  CL 99*  CO2 29  GLUCOSE 86  BUN 33*  CREATININE 8.88*  CALCIUM 9.0   No results for input(s): PTH in the last 72 hours. Iron Studies: No results for input(s): IRON, TIBC, TRANSFERRIN, FERRITIN in the last 72 hours.  Studies/Results: No results found.  I have reviewed the patient's current medications.  Assessment/Plan: 1 ESRD for HD in am.  Needs access revision prior to placement.  Getting closer to transfers, ??will need special chair 2 anemia on esa 3 HPTH on Vit D, sensipar 4 DM controlled 5 Massive Obesity ?? Bariatrics P mobilize , superficialize aVF, HD ,eval chair.    LOS: 26 days   Heywood Tokunaga L 10/31/2015,9:51 AM

## 2015-11-01 ENCOUNTER — Inpatient Hospital Stay (HOSPITAL_COMMUNITY): Payer: Medicare Other | Admitting: Certified Registered Nurse Anesthetist

## 2015-11-01 ENCOUNTER — Encounter (HOSPITAL_COMMUNITY)
Admission: EM | Disposition: A | Discharge status: Skilled Nursing Facility | Payer: Self-pay | Source: Home / Self Care | Attending: Internal Medicine

## 2015-11-01 DIAGNOSIS — K5900 Constipation, unspecified: Secondary | ICD-10-CM

## 2015-11-01 DIAGNOSIS — T82898A Other specified complication of vascular prosthetic devices, implants and grafts, initial encounter: Secondary | ICD-10-CM | POA: Diagnosis not present

## 2015-11-01 HISTORY — PX: FISTULA SUPERFICIALIZATION: SHX6341

## 2015-11-01 LAB — BASIC METABOLIC PANEL
ANION GAP: 12 (ref 5–15)
BUN: 24 mg/dL — ABNORMAL HIGH (ref 6–20)
CHLORIDE: 94 mmol/L — AB (ref 101–111)
CO2: 25 mmol/L (ref 22–32)
Calcium: 8.6 mg/dL — ABNORMAL LOW (ref 8.9–10.3)
Creatinine, Ser: 7.34 mg/dL — ABNORMAL HIGH (ref 0.44–1.00)
GFR calc non Af Amer: 5 mL/min — ABNORMAL LOW (ref >60)
GFR, EST AFRICAN AMERICAN: 6 mL/min — AB (ref >60)
Glucose, Bld: 91 mg/dL (ref 65–99)
POTASSIUM: 4.1 mmol/L (ref 3.5–5.1)
SODIUM: 131 mmol/L — AB (ref 135–145)

## 2015-11-01 LAB — GLUCOSE, CAPILLARY
Glucose-Capillary: 71 mg/dL (ref 65–99)
Glucose-Capillary: 79 mg/dL (ref 65–99)
Glucose-Capillary: 111 mg/dL — ABNORMAL HIGH (ref 65–99)

## 2015-11-01 LAB — POCT I-STAT 4, (NA,K, GLUC, HGB,HCT)
Glucose, Bld: 80 mg/dL (ref 65–99)
HEMATOCRIT: 38 % (ref 36.0–46.0)
HEMOGLOBIN: 12.9 g/dL (ref 12.0–15.0)
POTASSIUM: 4.2 mmol/L (ref 3.5–5.1)
SODIUM: 132 mmol/L — AB (ref 135–145)

## 2015-11-01 LAB — HEPATITIS B SURFACE ANTIGEN: HEP B S AG: NEGATIVE

## 2015-11-01 SURGERY — FISTULA SUPERFICIALIZATION
Anesthesia: Monitor Anesthesia Care | Site: Arm Upper | Laterality: Left

## 2015-11-01 MED ORDER — LIDOCAINE HCL (PF) 1 % IJ SOLN: 5.0000 mL | INTRAMUSCULAR | Status: DC | PRN

## 2015-11-01 MED ORDER — PHENYLEPHRINE 40 MCG/ML (10ML) SYRINGE FOR IV PUSH (FOR BLOOD PRESSURE SUPPORT)
PREFILLED_SYRINGE | INTRAVENOUS | Status: AC
  Filled 2015-11-01: qty 10

## 2015-11-01 MED ORDER — LIDOCAINE-PRILOCAINE 2.5-2.5 % EX CREA: 1.0000 "application " | TOPICAL_CREAM | CUTANEOUS | Status: DC | PRN

## 2015-11-01 MED ORDER — DARBEPOETIN ALFA 100 MCG/0.5ML IJ SOSY
PREFILLED_SYRINGE | INTRAMUSCULAR | Status: AC
  Filled 2015-11-01: qty 0.5

## 2015-11-01 MED ORDER — FENTANYL CITRATE (PF) 100 MCG/2ML IJ SOLN: 25.0000 ug | INTRAMUSCULAR | Status: DC | PRN

## 2015-11-01 MED ORDER — ALTEPLASE 2 MG IJ SOLR: 2.0000 mg | Freq: Once | INTRAMUSCULAR | Status: DC | PRN

## 2015-11-01 MED ORDER — SODIUM CHLORIDE 0.9 % IV SOLN: 100.0000 mL | INTRAVENOUS | Status: DC | PRN

## 2015-11-01 MED ORDER — DEXTROSE 5 % IV SOLN
1.5000 g | INTRAVENOUS | Status: DC | PRN
  Administered 2015-11-01: 1.5 g via INTRAVENOUS

## 2015-11-01 MED ORDER — PHENYLEPHRINE HCL 10 MG/ML IJ SOLN
10.0000 mg | INTRAVENOUS | Status: DC | PRN
  Administered 2015-11-01: 30 ug/min via INTRAVENOUS

## 2015-11-01 MED ORDER — PHENYLEPHRINE HCL 10 MG/ML IJ SOLN
INTRAMUSCULAR | Status: AC
  Filled 2015-11-01: qty 1

## 2015-11-01 MED ORDER — BUPIVACAINE HCL (PF) 0.5 % IJ SOLN
INTRAMUSCULAR | Status: DC | PRN
  Administered 2015-11-01: 54.5 mL

## 2015-11-01 MED ORDER — FENTANYL CITRATE (PF) 250 MCG/5ML IJ SOLN
INTRAMUSCULAR | Status: DC | PRN
  Administered 2015-11-01 (×4): 50 ug via INTRAVENOUS

## 2015-11-01 MED ORDER — 0.9 % SODIUM CHLORIDE (POUR BTL) OPTIME
TOPICAL | Status: DC | PRN
  Administered 2015-11-01: 1000 mL

## 2015-11-01 MED ORDER — HEPARIN SODIUM (PORCINE) 1000 UNIT/ML DIALYSIS: 1000.0000 [IU] | INTRAMUSCULAR | Status: DC | PRN

## 2015-11-01 MED ORDER — DEXTROSE 5 % IV SOLN
INTRAVENOUS | Status: AC
  Filled 2015-11-01: qty 1.5

## 2015-11-01 MED ORDER — BUPIVACAINE HCL (PF) 0.5 % IJ SOLN
INTRAMUSCULAR | Status: AC
  Filled 2015-11-01: qty 30

## 2015-11-01 MED ORDER — PHENYLEPHRINE HCL 10 MG/ML IJ SOLN
INTRAMUSCULAR | Status: DC | PRN
Start: 2015-11-01 — End: 2015-11-01
  Administered 2015-11-01 (×3): 80 ug via INTRAVENOUS
  Administered 2015-11-01: 120 ug via INTRAVENOUS

## 2015-11-01 MED ORDER — MEPERIDINE HCL 25 MG/ML IJ SOLN: 6.2500 mg | INTRAMUSCULAR | Status: DC | PRN

## 2015-11-01 MED ORDER — PROPOFOL 500 MG/50ML IV EMUL
INTRAVENOUS | Status: DC | PRN
  Administered 2015-11-01: 30 ug/kg/min via INTRAVENOUS

## 2015-11-01 MED ORDER — SODIUM CHLORIDE 0.9 % IV SOLN
INTRAVENOUS | Status: DC | PRN
  Administered 2015-11-01: 500 mL

## 2015-11-01 MED ORDER — PROMETHAZINE HCL 25 MG/ML IJ SOLN: 6.2500 mg | INTRAMUSCULAR | Status: DC | PRN

## 2015-11-01 MED ORDER — LIDOCAINE HCL (PF) 1 % IJ SOLN
INTRAMUSCULAR | Status: AC
  Filled 2015-11-01: qty 30

## 2015-11-01 MED ORDER — LACTATED RINGERS IV SOLN: INTRAVENOUS | Status: DC

## 2015-11-01 MED ORDER — PENTAFLUOROPROP-TETRAFLUOROETH EX AERO: 1.0000 "application " | INHALATION_SPRAY | CUTANEOUS | Status: DC | PRN

## 2015-11-01 MED ORDER — DEXTROSE 5 % IV SOLN
1.5000 g | INTRAVENOUS | Status: DC
  Filled 2015-11-01: qty 1.5

## 2015-11-01 MED ORDER — HEPARIN SODIUM (PORCINE) 1000 UNIT/ML DIALYSIS
100.0000 [IU]/kg | INTRAMUSCULAR | Status: DC | PRN
  Filled 2015-11-01: qty 10

## 2015-11-01 MED ORDER — FENTANYL CITRATE (PF) 250 MCG/5ML IJ SOLN
INTRAMUSCULAR | Status: AC
Start: 2015-11-01 — End: 2015-11-01
  Filled 2015-11-01: qty 5

## 2015-11-01 MED ORDER — HEMOSTATIC AGENTS (NO CHARGE) OPTIME
TOPICAL | Status: DC | PRN
Start: 2015-11-01 — End: 2015-11-01
  Administered 2015-11-01: 1 via TOPICAL

## 2015-11-01 MED ORDER — SODIUM CHLORIDE 0.9 % IV SOLN
Freq: Once | INTRAVENOUS | Status: AC
  Administered 2015-11-01: 15:00:00 via INTRAVENOUS

## 2015-11-01 MED ORDER — LIDOCAINE HCL (PF) 1 % IJ SOLN
INTRAMUSCULAR | Status: DC | PRN
  Administered 2015-11-01: 54.5 mL

## 2015-11-01 SURGICAL SUPPLY — 38 items
BLADE CLIPPER SURG (BLADE) ×3 IMPLANT
CANISTER SUCTION 2500CC (MISCELLANEOUS) ×3 IMPLANT
CLIP TI MEDIUM 6 (CLIP) ×3 IMPLANT
CLIP TI WIDE RED SMALL 6 (CLIP) ×3 IMPLANT
COVER PROBE W GEL 5X96 (DRAPES) ×3 IMPLANT
DECANTER SPIKE VIAL GLASS SM (MISCELLANEOUS) ×3 IMPLANT
DRAIN PENROSE 1/2X12 LTX STRL (WOUND CARE) IMPLANT
DRAPE PROXIMA HALF (DRAPES) ×3 IMPLANT
DRSG COVADERM 4X10 (GAUZE/BANDAGES/DRESSINGS) ×3 IMPLANT
ELECT REM PT RETURN 9FT ADLT (ELECTROSURGICAL) ×3
ELECTRODE REM PT RTRN 9FT ADLT (ELECTROSURGICAL) ×1 IMPLANT
GLOVE BIO SURGEON STRL SZ7 (GLOVE) ×3 IMPLANT
GLOVE BIOGEL PI IND STRL 6.5 (GLOVE) ×1 IMPLANT
GLOVE BIOGEL PI IND STRL 7.5 (GLOVE) ×1 IMPLANT
GLOVE BIOGEL PI INDICATOR 6.5 (GLOVE) ×2
GLOVE BIOGEL PI INDICATOR 7.5 (GLOVE) ×2
GLOVE ECLIPSE 7.5 STRL STRAW (GLOVE) ×3 IMPLANT
GLOVE SURG SS PI 6.5 STRL IVOR (GLOVE) ×3 IMPLANT
GOWN STRL REUS W/ TWL LRG LVL3 (GOWN DISPOSABLE) ×4 IMPLANT
GOWN STRL REUS W/TWL LRG LVL3 (GOWN DISPOSABLE) ×8
HEMOSTAT SPONGE AVITENE ULTRA (HEMOSTASIS) ×3 IMPLANT
HOVERMATT SINGLE USE (MISCELLANEOUS) ×3 IMPLANT
KIT BASIN OR (CUSTOM PROCEDURE TRAY) ×3 IMPLANT
KIT ROOM TURNOVER OR (KITS) ×3 IMPLANT
LIQUID BAND (GAUZE/BANDAGES/DRESSINGS) ×3 IMPLANT
NS IRRIG 1000ML POUR BTL (IV SOLUTION) ×3 IMPLANT
PACK CV ACCESS (CUSTOM PROCEDURE TRAY) ×3 IMPLANT
PAD ARMBOARD 7.5X6 YLW CONV (MISCELLANEOUS) ×6 IMPLANT
SPONGE LAP 18X18 X RAY DECT (DISPOSABLE) ×3 IMPLANT
SPONGE SURGIFOAM ABS GEL 100 (HEMOSTASIS) IMPLANT
STAPLER VISISTAT 35W (STAPLE) ×3 IMPLANT
SUT MNCRL AB 4-0 PS2 18 (SUTURE) ×3 IMPLANT
SUT PROLENE 6 0 BV (SUTURE) IMPLANT
SUT PROLENE 7 0 BV 1 (SUTURE) ×3 IMPLANT
SUT VIC AB 3-0 SH 27 (SUTURE) ×8
SUT VIC AB 3-0 SH 27X BRD (SUTURE) ×4 IMPLANT
UNDERPAD 30X30 INCONTINENT (UNDERPADS AND DIAPERS) ×3 IMPLANT
WATER STERILE IRR 1000ML POUR (IV SOLUTION) ×3 IMPLANT

## 2015-11-01 NOTE — Op Note (Signed)
    OPERATIVE NOTE   PROCEDURE: 1. left brachiocephalic arteriovenous fistula superficialization 2. Side branch ligation x 4  PRE-OPERATIVE DIAGNOSIS: Deep arteriovenous fistula   POST-OPERATIVE DIAGNOSIS: same as above   SURGEON: Adele Barthel, MD  ASSISTANT(S): Gerri Lins, PAC   ANESTHESIA: local and MAC  ESTIMATED BLOOD LOSS: 50 cc  FINDING(S): 1. Deep fistula (>2 in proximally) 2. Fistula >6 mm throughout most of fistula: 2 short segments 4-5 mm in diameter 3. Improved diameters at short segments with side branch ligation  SPECIMEN(S):  none  INDICATIONS:   [name] is a 60 y.o. female who presents with mature arteriovenous fistula that is > 6 mm deep.  It was felt that without superficialization of the arteriovenous fistula it could not be used for hemodialysis.  Risk, benefits, and alternatives to access surgery were discussed.  The patient is aware the risks include but are not limited to: bleeding, infection, steal syndrome, nerve damage, ischemic monomelic neuropathy, failure to mature, need for additional procedures, death and stroke.  The patient agrees to proceed forward with the procedure.  DESCRIPTION: After obtain full informed written consent, the patient was brought back to the operating room and placed supine upon the operating table.  The patient received IV antibiotics prior to induction.  After obtaining adequate anesthesia, the patient was prepped and draped in the standard fashion for: left arm access.  I turned my attention to the antecubitum, where I could feel the arterial anastomosis.  Using the Sonosite, I marked out the pathway of the cephalic vein all the way up to the axilla.  I made an incision 3 cm proximal to the arterial anastomosis up to the proximal 1/3 of the vein where the cephalic vein began to dive toward the axilla.  Using electrocautery and blunt dissection, I developed a plane down the vein.  I tied off side branches with silk ties  prior to transecting them.  In such fashion, I fully mobilized the vein in its surgical exposure.  The vein was noted to be >6 mm in diameter externally through most of its length with two short segments where it appeared to be 4-5 mm in diameter.  After ligation of side branches and hydrodistension even this segments appeared to be largery.  Using the electrocautery, a shallow trough (6 mm deep) was developed in the lateral subcutaneous tissue in the incision.  The vein was transposed into the trough and secured with interrupted stitches of 3-0 Vicryl.  The deep and superficial subcutaneous tissue was reapproximated with a running stitch of 3-0 Vicryl.  The skin was reapproximated with staples due to this patient's obesity.  The skin was cleaned, dried, and dressed with a sterile Coverderm.   COMPLICATIONS: none  CONDITION: stable   Adele Barthel, MD [facility] [place] Office: [phone] Pager: [phone]  11/01/2015, 4:33 PM

## 2015-11-01 NOTE — Anesthesia Postprocedure Evaluation (Signed)
Anesthesia Post Note  Patient: [name]  Procedure(s) Performed: Procedure(s) (LRB): SUPERFICIALIZATION OF LEFT ARM BRACHIOCEPHALIC FISTULA  (Left)  Patient location during evaluation: PACU Anesthesia Type: MAC Level of consciousness: awake and alert Pain management: pain level controlled Vital Signs Assessment: post-procedure vital signs reviewed and stable Respiratory status: spontaneous breathing, nonlabored ventilation, respiratory function stable and patient connected to nasal cannula oxygen Cardiovascular status: stable and blood pressure returned to baseline Anesthetic complications: no    Last Vitals:  Filed Vitals:   11/01/15 1700 11/01/15 1715  BP: 112/85 104/63  Pulse: 99 100  Temp: 36.9 C 36.8 C  Resp: 14 17    Last Pain:  Filed Vitals:   11/01/15 1734  PainSc: 0-No pain                 Effie Berkshire

## 2015-11-01 NOTE — Anesthesia Procedure Notes (Signed)
Procedure Name: MAC Date/Time: 11/01/2015 2:56 PM Performed by: Lance Coon Pre-anesthesia Checklist: Patient identified, Timeout performed, Emergency Drugs available, Suction available and Patient being monitored Patient Re-evaluated:Patient Re-evaluated prior to inductionOxygen Delivery Method: Simple face mask

## 2015-11-01 NOTE — H&P (Addendum)
Brief History and Physical  History of Present Illness  [name] is a 60 y.o. female who presents with chief complaint: ESRD.  The patient presents today for superficialization of L BC AVF.    Past Medical History  Diagnosis Date  . Hypertension   . Kidney disease   . High cholesterol   . Shortness of breath dyspnea     with exertion  . H/O blood clots     many years ago in leg  . Sleep apnea     uses c-pap most of time  . Pneumonia   . Diabetes mellitus without complication ([place])     type 2  . Seizures ([place])     had one or two (more than 10-15 years ago)  . Arthritis   . Constipation     Past Surgical History  Procedure Laterality Date  . Abdominal hysterectomy    . Colonoscopy    . Av fistula placement Left 07/25/2015    Procedure: ARTERIOVENOUS (AV) FISTULA CREATION -LEFT BRACHIOCEPHALIC;  Surgeon: Conrad Newellton, MD;  Location: [place];  Service: Vascular;  Laterality: Left;  . Insertion of dialysis catheter Right 10/09/2015    Procedure: INSERTION OF DIALYSIS CATHETER RIGHT INTERNAL JUGULAR ;  Surgeon: Angelia Mould, MD;  Location: [facility] OR;  Service: Vascular;  Laterality: Right;    Social History   Social History  . Marital Status: Single    Spouse Name: N/A  . Number of Children: N/A  . Years of Education: N/A   Occupational History  . Not on file.   Social History Main Topics  . Smoking status: Never Smoker   . Smokeless tobacco: Never Used  . Alcohol Use: No  . Drug Use: No  . Sexual Activity: Not on file   Other Topics Concern  . Not on file   Social History Narrative    Family History  Problem Relation Age of Onset  . Cancer Mother   . Hypertension Father   . Heart attack Sister   . Heart attack Paternal Aunt   . Stroke Neg Hx     No current facility-administered medications on file prior to encounter.   Current Outpatient Prescriptions on File Prior to Encounter  Medication Sig Dispense Refill  . amLODipine (NORVASC)  10 MG tablet Take 10 mg by mouth at bedtime.     Marland Kitchen aspirin 81 MG tablet Take 81 mg by mouth daily.    Marland Kitchen atorvastatin (LIPITOR) 40 MG tablet Take 40 mg by mouth daily.    . Cholecalciferol (VITAMIN D PO) Take 1 tablet by mouth daily.    . cloNIDine (CATAPRES) 0.3 MG tablet Take 0.3 mg by mouth 2 (two) times daily.    . febuxostat (ULORIC) 40 MG tablet Take 40 mg by mouth daily.    . furosemide (LASIX) 80 MG tablet Take 80 mg by mouth daily.    . Liraglutide (VICTOZA) 18 MG/3ML SOPN Inject 1.8 mg into the skin daily.     . metoprolol succinate (TOPROL-XL) 100 MG 24 hr tablet Take 100 mg by mouth 2 (two) times daily. Take with or immediately following a meal.    . Multiple Vitamins-Minerals (MULTIVITAMINS THER. W/MINERALS) TABS tablet Take 1 tablet by mouth daily.    . sacubitril-valsartan (ENTRESTO) 24-26 MG Take 1 tablet by mouth 2 (two) times daily.      Allergies  Allergen Reactions  . Morphine And Related Other (See Comments)    Per patient, morphine  causes seizures    Review of Systems: As listed above, otherwise negative.  Physical Examination  Filed Vitals:   11/01/15 1030 11/01/15 1100 11/01/15 1130 11/01/15 1243  BP: 127/72 110/70 114/65 122/66  Pulse: 92 80 80 90  Temp:    97.8 F (36.6 C)  TempSrc:    Oral  Resp:    18  Height:      Weight:    330 lb 11 oz (150 kg)  SpO2:    100%    General: A&O x 3, WDWN  Pulmonary: Sym exp, good air movt, CTAB, no rales, rhonchi, & wheezing  Cardiac: RRR, Nl S1, S2, no Murmurs, rubs or gallops  Gastrointestinal: soft, NTND, -G/R, - HSM, - masses, - CVAT B  Musculoskeletal: M/S 5/5 throughout , Extremities without ischemic changes , palpable thrill and bruit in L arm  Laboratory: CBC:    Component Value Date/Time   WBC 9.1 10/30/2015 0834   RBC 3.70* 10/30/2015 0834   HGB 10.2* 10/30/2015 0834   HCT 33.4* 10/30/2015 0834   PLT 276 10/30/2015 0834   MCV 90.3 10/30/2015 0834   MCH 27.6 10/30/2015 0834   MCHC 30.5  10/30/2015 0834   RDW 16.6* 10/30/2015 0834   LYMPHSABS 1.4 10/17/2015 0511   MONOABS 1.1* 10/17/2015 0511   EOSABS 0.4 10/17/2015 0511   BASOSABS 0.0 10/17/2015 0511    BMP:    Component Value Date/Time   NA 131* 11/01/2015 0019   K 4.1 11/01/2015 0019   CL 94* 11/01/2015 0019   CO2 25 11/01/2015 0019   GLUCOSE 91 11/01/2015 0019   BUN 24* 11/01/2015 0019   CREATININE 7.34* 11/01/2015 0019   CALCIUM 8.6* 11/01/2015 0019   CALCIUM 7.7* 10/07/2015 0355   GFRNONAA 5* 11/01/2015 0019   GFRAA 6* 11/01/2015 0019    Coagulation: No results found for: INR, PROTIME No results found for: PTT  Lipids: No results found for: CHOL, TRIG, HDL, CHOLHDL, VLDL, LDLCALC, LDLDIRECT    Medical Decision Making  [name] is a 60 y.o. female who presents with: ESRD.   The patient is scheduled for: superficialization of L BC AVF  Risk, benefits, and alternatives to access surgery were discussed.  The patient is aware the risks include but are not limited to: bleeding, infection, steal syndrome, nerve damage, ischemic monomelic neuropathy, failure to mature, and need for additional procedures.  The patient is aware of the risks and agrees to proceed.  Adele Barthel, MD [facility] [place] Office: [phone] Pager: [phone]  11/01/2015, 2:22 PM

## 2015-11-01 NOTE — Progress Notes (Signed)
   Daily Progress Note  Assessment/Planning: POD #0 s/p L BC AVF superficialization, Side branch ligation   Pain control  No steal sx  Palpable thrill even through bandage  No active bleeding  Follow up in office in 2 weeks for staples removal.  F/U 4 weeks for re-evaluation for use  Subjective  - Day of Surgery  Pain controlled, no steal sx  Objective Filed Vitals:   11/01/15 1648 11/01/15 1659 11/01/15 1700 11/01/15 1715  BP:  112/85 112/85 104/63  Pulse: 96 99 99 100  Temp:   98.5 F (36.9 C) 98.2 F (36.8 C)  TempSrc:    Oral  Resp:  18 14 17   Height:      Weight:      SpO2: 98% 99% 100% 97%    Intake/Output Summary (Last 24 hours) at 11/01/15 2007 Last data filed at 11/01/15 1638  Gross per 24 hour  Intake    450 ml  Output   1350 ml  Net   -900 ml    VASC  L arm bandage without active bleeding, +thrill even through bandage, L hand warm with intact hand grip 5/5, gross sensation intact  Laboratory CBC    Component Value Date/Time   WBC 9.1 10/30/2015 0834   HGB 12.9 11/01/2015 1432   HCT 38.0 11/01/2015 1432   PLT 276 10/30/2015 0834    BMET    Component Value Date/Time   NA 132* 11/01/2015 1432   K 4.2 11/01/2015 1432   CL 94* 11/01/2015 0019   CO2 25 11/01/2015 0019   GLUCOSE 80 11/01/2015 1432   BUN 24* 11/01/2015 0019   CREATININE 7.34* 11/01/2015 0019   CALCIUM 8.6* 11/01/2015 0019   CALCIUM 7.7* 10/07/2015 0355   GFRNONAA 5* 11/01/2015 0019   GFRAA 6* 11/01/2015 0019    Adele Barthel, MD [facility]: [phone] Pager: [phone]  11/01/2015, 8:07 PM

## 2015-11-01 NOTE — Progress Notes (Signed)
   11/01/15 0900  OT Visit Information  Last OT Received On 11/01/15  Assistance Needed +1 ((chair behind is helpful, prob can do it without +2)  Reason Eval/Treat Not Completed Patient at procedure or test/ unavailable  History of Present Illness 60 year old female presenting with 10 days of progressive dyspnea. Now new to HD;  PMH is significant for CKD, HTN, T2DM, and seizure disorder    Will reattempt as able.  Tyrone Schimke OTR/L Pager: [phone]

## 2015-11-01 NOTE — Progress Notes (Signed)
PROGRESS NOTE  [name]  [id number] DOB: 03/16/1956 DOA: 10/04/2015 PCP: Dwan Bolt, MD  Brief Narrative:  Patient was admitted on 10/04/2015, with complaint of shortness of breath progressively worsening without any chest pain or cough or fever or chills, was found to have acute on chronic diastolic dysfunction with elevated troponin and elevated creatinine. Patient also had hyperkalemia with metabolic acidosis. Patient was treated conservatively with Lasix in the ER but later on transferred to [facility] on admission. Initial AV fistula hemodialysis was not successful and temporary hemodialysis catheter was placed. As per vascular surgery the AV fistula will be superficialized. Currently further plan is continue hemodialysis and arrange for outpatient discharge HD. Limiting factor in why the patient is still hospitalized due to difficulty finding an outpt HD center able to accommodate patient due to her size. Her strength has been improving, although she will need SNF at discharge.    Assessment & Plan:   Principal Problem:   Acute pulmonary edema (HCC) Active Problems:   Morbid obesity ([place])   Renal failure (ARF), acute on chronic (HCC)   Anemia in chronic kidney disease   Type 2 diabetes mellitus with renal complication (HCC)   Hypertension   Elevated troponin   Hypoglycemia   Occult blood positive stool   Chronic anemia   ESRD on dialysis ([place])   Acute respiratory failure with hypoxia (HCC)   Constipation   Hematochezia   Acute hypoxic respiratory failure due to Acute pulmonary edema (HCC)  - Oxygenation improved with fluid removal with HD - stable on RA -pulmonary edema more a function of renal failure rather than CHF  -10/26/15--discussed with cardiology--pt has not received any Entresto since admission and has preserved EF--hold entresto until f/u with Dr. Irish Lack  New ESRD, Hemodialysis started on 10/05/15 - Left arm fistula placed in 07/25/2015.  Too deep. - Tunneled HD catheter on 10/09/15. - AV fistula revision today  Hematochezia -10/29/2015 Patient developed small to moderate amount of hematochezia stool -Feb 2011 colonoscopy--diverticulosis, hyperplastic polyps -trend CBC--Hgb stable thus far -holding heparin [place] and ASA -appreciate [facility]-->intensify bowel regimen, hold off endoscopy for now unless further hematochezia -10/30/15--BM without blood  Generalized fatigue and weakness with physical deconditioning. - Likely due to morbid obesity and prolonged hospital stay - TSH & cortisol level unremarkable. - To SNF for PT/OT  Elevated troponin - Troponin was 11 on May 6, but this was likely a false positive. - 10/07/15 Echo-- EF-- 55-60%, no WMA; EKG does not show any acute ischemic changes. - Cardiology has signed off  Acute encephalopathy due to uremia and sedating medications, resolved..  Essential hypertension, blood pressures low normal and hypotensive during HD -d/c metoprolol  Anemia of chronic kidney disease. - TSH within normal limits. -Iron saturation 26%, ferritin 47, folic acid 123456 - 0000000 elevated. - Transfused 1 unit on May of 9  Obesity hypoventilation syndrome. - continue home CPAP.  Constipation, resolved.  Continue miralax and senna.  Impaired glucose tolerance -HbA1c--6.2 -previously on victoza prior to admission -CBGs controlled  DVT prophylaxis:  SCD Code Status:  full Family Communication:  Patient alone Disposition Plan:  Having difficulty finding an HD center that can accommodate a bariatric chair.  Vascular revision today.     Consultants:   Nephrology  Cardiology  Vascular surgery  Gastroenterology  Wound care  Procedures:  Started HD  Antimicrobials:   none    Subjective:  Tired this morning and states she is trying to sleep during HD.  Denies cough, shortness of breath, nausea.    Objective: Filed Vitals:   11/01/15 1030 11/01/15 1100 11/01/15 1130 11/01/15  1243  BP: 127/72 110/70 114/65 122/66  Pulse: 92 80 80 90  Temp:    97.8 F (36.6 C)  TempSrc:    Oral  Resp:    18  Height:      Weight:    150 kg (330 lb 11 oz)  SpO2:    100%    Intake/Output Summary (Last 24 hours) at 11/01/15 1357 Last data filed at 11/01/15 1243  Gross per 24 hour  Intake    580 ml  Output   1300 ml  Net   -720 ml   Filed Weights   10/31/15 2146 11/01/15 0743 11/01/15 1243  Weight: 150.866 kg (332 lb 9.6 oz) 151 kg (332 lb 14.3 oz) 150 kg (330 lb 11 oz)    Examination:  General exam:  Adult female.  No acute distress. Seen at HD.  Asleep but arouseable HEENT:  NCAT, MMM Respiratory system: Clear to auscultation bilaterally Cardiovascular system: Regular rate and rhythm, normal S1/S2. No murmurs, rubs, gallops or clicks.  Warm extremities Gastrointestinal system: Normal active bowel sounds, soft, nondistended, nontender. MSK:  Normal tone and bulk, no lower extremity edema    Data Reviewed: I have personally reviewed following labs and imaging studies  CBC:  Recent Labs Lab 10/27/15 0709 10/29/15 1514 10/30/15 0834  WBC 7.8 8.3 9.1  HGB 10.6* 10.9* 10.2*  HCT 34.8* 36.1 33.4*  MCV 88.5 89.8 90.3  PLT 251 292 [id number]   Basic Metabolic Panel:  Recent Labs Lab 10/26/15 0502 10/27/15 0709 10/30/15 0834 11/01/15 0019  NA 137 134* 138 131*  K 3.9 3.9 4.5 4.1  CL 99* 96* 99* 94*  CO2 28 28 29 25   GLUCOSE 90 96 86 91  BUN 18 27* 33* 24*  CREATININE 5.24* 7.34* 8.88* 7.34*  CALCIUM 9.0 9.3 9.0 8.6*  PHOS 2.6 3.0 2.9  --    GFR: Estimated Creatinine Clearance: 12.6 mL/min (by C-G formula based on Cr of 7.34). Liver Function Tests:  Recent Labs Lab 10/26/15 0502 10/27/15 0709 10/30/15 0834  ALBUMIN 2.4* 2.6* 2.6*   No results for input(s): LIPASE, AMYLASE in the last 168 hours. No results for input(s): AMMONIA in the last 168 hours. Coagulation Profile: No results for input(s): INR, PROTIME in the last 168 hours. Cardiac  Enzymes: No results for input(s): CKTOTAL, CKMB, CKMBINDEX, TROPONINI in the last 168 hours. BNP (last 3 results) No results for input(s): PROBNP in the last 8760 hours. HbA1C: No results for input(s): HGBA1C in the last 72 hours. CBG:  Recent Labs Lab 10/31/15 0751 10/31/15 1207 10/31/15 1618 10/31/15 2137 10/31/15 2313  GLUCAP 84 92 82 80 103*   Lipid Profile: No results for input(s): CHOL, HDL, LDLCALC, TRIG, CHOLHDL, LDLDIRECT in the last 72 hours. Thyroid Function Tests: No results for input(s): TSH, T4TOTAL, FREET4, T3FREE, THYROIDAB in the last 72 hours. Anemia Panel: No results for input(s): VITAMINB12, FOLATE, FERRITIN, TIBC, IRON, RETICCTPCT in the last 72 hours. Urine analysis: No results found for: COLORURINE, APPEARANCEUR, LABSPEC, PHURINE, GLUCOSEU, HGBUR, BILIRUBINUR, KETONESUR, PROTEINUR, UROBILINOGEN, NITRITE, LEUKOCYTESUR Sepsis Labs: @LABRCNTIP (procalcitonin:4,lacticidven:4)  )No results found for this or any previous visit (from the past 240 hour(s)).    Radiology Studies: No results found.   Scheduled Meds: . alteplase  2 mg Intracatheter Once  . atorvastatin  40 mg Oral Daily  . calcitRIOL  1 mcg Oral Q  T,Th,Sa-HD  . cinacalcet  60 mg Oral Q supper  . darbepoetin (ARANESP) injection - DIALYSIS  100 mcg Intravenous Q Thu-HD  . febuxostat  40 mg Oral Daily  . metoprolol succinate  25 mg Oral QHS  . multivitamin with minerals  1 tablet Oral Daily  . neomycin-bacitracin-polymyxin   Topical Daily  . polyethylene glycol  17 g Oral BID  . senna-docusate  2 tablet Oral BID  . sevelamer carbonate  2,400 mg Oral TID WC  . sodium chloride flush  3 mL Intravenous Q12H   Continuous Infusions:    LOS: 27 days    Time spent: 30 min    Janece Canterbury, MD [facility] Pager [phone]  If 7PM-7AM, please contact night-coverage [email or web address] Password [email or web address] 11/01/2015, 1:57 PM

## 2015-11-01 NOTE — Anesthesia Preprocedure Evaluation (Addendum)
Anesthesia Evaluation  Patient identified by MRN, date of birth, ID band Patient awake    Reviewed: Allergy & Precautions, NPO status , Patient's Chart, lab work & pertinent test results, reviewed documented beta blocker date and time   History of Anesthesia Complications Negative for: history of anesthetic complications  Airway Mallampati: II  TM Distance: >3 FB Neck ROM: Full    Dental  (+) Missing, Dental Advisory Given, Poor Dentition,    Pulmonary shortness of breath and with exertion, sleep apnea and Continuous Positive Airway Pressure Ventilation , pneumonia, resolved,    breath sounds clear to auscultation       Cardiovascular Exercise Tolerance: Poor hypertension, Pt. on medications and Pt. on home beta blockers + DOE   Rhythm:Regular Rate:Normal     Neuro/Psych Seizures -, Well Controlled,     GI/Hepatic   Endo/Other  diabetes, Well Controlled, Type 2Morbid obesity          Renal/GU ESRF, Dialysis and Renal InsufficiencyRenal disease     Musculoskeletal  (+) Arthritis , Osteoarthritis,    Abdominal (+) + obese,   Peds  Hematology  (+) anemia , S/p 1 transfusion of PRBC   Anesthesia Other Findings  Received HD morning of 11/01/15  Reproductive/Obstetrics                          Lab Results  Component Value Date   WBC 9.1 10/30/2015   HGB 10.2* 10/30/2015   HCT 33.4* 10/30/2015   MCV 90.3 10/30/2015   PLT 276 10/30/2015     Chemistry      Component Value Date/Time   NA 131* 11/01/2015 0019   K 4.1 11/01/2015 0019   CL 94* 11/01/2015 0019   CO2 25 11/01/2015 0019   BUN 24* 11/01/2015 0019   CREATININE 7.34* 11/01/2015 0019      Component Value Date/Time   CALCIUM 8.6* 11/01/2015 0019   CALCIUM 7.7* 10/07/2015 0355   ALKPHOS 117 10/17/2015 0511   AST 23 10/17/2015 0511   ALT 28 10/17/2015 0511   BILITOT 0.4 10/17/2015 0511     No results found for: INR, PROTIME  BP  Readings from Last 3 Encounters:  11/01/15 126/66  10/03/15 140/80  09/19/15 180/81   Wt Readings from Last 3 Encounters:  11/01/15 332 lb 14.3 oz (151 kg)  10/03/15 412 lb (186.882 kg)  09/19/15 397 lb 14.4 oz (180.486 kg)   Echo 10/07/15: Study Conclusions - Left ventricle: The cavity size was normal. Wall thickness was increased in a pattern of severe LVH. Systolic function was normal. The estimated ejection fraction was in the range of 55% to 60%. - Aortic valve: There was very mild stenosis. - Left atrium: The atrium was moderately dilated. - Right ventricle: Catheter in RV - Atrial septum: No defect or patent foramen ovale was identified. - Pulmonary arteries: PA peak pressure: 37 mm Hg (S).  EKG 10/06/15: Sinus tachycardia Otherwise normal ECG Premature ventricular complexes  Anesthesia Physical Anesthesia Plan  ASA: III  Anesthesia Plan: MAC   Post-op Pain Management:    Induction: Intravenous  Airway Management Planned: Natural Airway and Simple Face Mask  Additional Equipment:   Intra-op Plan:   Post-operative Plan:   Informed Consent: I have reviewed the patients History and Physical, chart, labs and discussed the procedure including the risks, benefits and alternatives for the proposed anesthesia with the patient or authorized representative who has indicated his/her understanding and acceptance.  Plan Discussed with: CRNA  Anesthesia Plan Comments:         Anesthesia Quick Evaluation

## 2015-11-01 NOTE — Procedures (Signed)
I was present at this session.  I have reviewed the session itself and made appropriate changes.  BP decrease early, now ok in 120s sys.  Cath flow marginal. Will need TPA  Tavoris Brisk L 6/1/201710:22 AM

## 2015-11-01 NOTE — Transfer of Care (Signed)
Immediate Anesthesia Transfer of Care Note  Patient: [name]  Procedure(s) Performed: Procedure(s): SUPERFICIALIZATION OF LEFT ARM BRACHIOCEPHALIC FISTULA  (Left)  Patient Location: PACU  Anesthesia Type:MAC  Level of Consciousness: awake, alert  and oriented  Airway & Oxygen Therapy: Patient Spontanous Breathing and Patient connected to nasal cannula oxygen  Post-op Assessment: Report given to RN and Post -op Vital signs reviewed and stable  Post vital signs: Reviewed and stable  Last Vitals:  Filed Vitals:   11/01/15 1130 11/01/15 1243  BP: 114/65 122/66  Pulse: 80 90  Temp:  36.6 C  Resp:  18    Last Pain:  Filed Vitals:   11/01/15 1300  PainSc: 0-No pain      Patients Stated Pain Goal: 0 ([id number])  Complications: No apparent anesthesia complications

## 2015-11-01 NOTE — Progress Notes (Signed)
Subjective: Interval History: has no complaint .  Objective: Vital signs in last 24 hours: Temp:  [97.9 F (36.6 C)-98.6 F (37 C)] 98 F (36.7 C) (06/01 0743) Pulse Rate:  [87-97] 89 (06/01 1000) Resp:  [16-18] 18 (06/01 0743) BP: (86-136)/(47-86) 126/66 mmHg (06/01 1000) SpO2:  [92 %-100 %] 100 % (06/01 0743) Weight:  [150.821 kg (332 lb 8 oz)-151 kg (332 lb 14.3 oz)] 151 kg (332 lb 14.3 oz) (06/01 0743) Weight change:   Intake/Output from previous day: 05/31 0701 - 06/01 0700 In: 820 [P.O.:820] Out: 0  Intake/Output this shift:    General appearance: alert, cooperative and morbidly obese Resp: diminished breath sounds bilaterally Chest wall: RIJ cath Cardio: S1, S2 normal and systolic murmur: holosystolic 2/6, blowing at apex GI: massive, pos bs,  Extremities: AVF RUA deep  Lab Results:  Recent Labs  10/29/15 1514 10/30/15 0834  WBC 8.3 9.1  HGB 10.9* 10.2*  HCT 36.1 33.4*  PLT 292 276   BMET:  Recent Labs  10/30/15 0834 11/01/15 0019  NA 138 131*  K 4.5 4.1  CL 99* 94*  CO2 29 25  GLUCOSE 86 91  BUN 33* 24*  CREATININE 8.88* 7.34*  CALCIUM 9.0 8.6*   No results for input(s): PTH in the last 72 hours. Iron Studies: No results for input(s): IRON, TIBC, TRANSFERRIN, FERRITIN in the last 72 hours.  Studies/Results: No results found.  I have reviewed the patient's current medications.  Assessment/Plan: 1 ESRD for HD, vol ok. Need to get fistula useable 2 Massive obesity  ?? Bariatrics 3 DM controlled 4 Anemia stable 5 HPTH vit D, sensipar P HD, superficialize fistula.,  Esa, vit D, Fe, sensipar    LOS: 27 days   [name] L 11/01/2015,10:23 AM

## 2015-11-02 ENCOUNTER — Encounter (HOSPITAL_COMMUNITY): Payer: Self-pay | Admitting: Vascular Surgery

## 2015-11-02 LAB — GLUCOSE, CAPILLARY
GLUCOSE-CAPILLARY: 119 mg/dL — AB (ref 65–99)
GLUCOSE-CAPILLARY: 103 mg/dL — AB (ref 65–99)
Glucose-Capillary: 92 mg/dL (ref 65–99)

## 2015-11-02 NOTE — Progress Notes (Signed)
Subjective: Interval History: has no complaint, interested in bariatrics.  Objective: Vital signs in last 24 hours: Temp:  [97.3 F (36.3 C)-98.5 F (36.9 C)] 98.3 F (36.8 C) (06/02 0746) Pulse Rate:  [80-108] 105 (06/02 0746) Resp:  [14-19] 18 (06/02 0746) BP: (92-127)/(52-85) 99/73 mmHg (06/02 0746) SpO2:  [92 %-100 %] 100 % (06/02 0746) Weight:  [150 kg (330 lb 11 oz)-182.346 kg (402 lb)] 182.346 kg (402 lb) (06/01 2014) Weight change: 0.179 kg (6.3 oz)  Intake/Output from previous day: 06/01 0701 - 06/02 0700 In: 350 [I.V.:350] Out: 1350 [Blood:50] Intake/Output this shift:    General appearance: alert, cooperative and morbidly obese Resp: diminished breath sounds bilaterally Chest wall: RIJ cath Cardio: S1, S2 normal and systolic murmur: holosystolic 2/6, blowing at apex GI: obese,massive , pos bs, liver down 6 cm Extremities: extremities normal, atraumatic, no cyanosis or edema and dressing, pos B&T LUA  Lab Results:  Recent Labs  11/01/15 1432  HGB 12.9  HCT 38.0   BMET:  Recent Labs  11/01/15 0019 11/01/15 1432  NA 131* 132*  K 4.1 4.2  CL 94*  --   CO2 25  --   GLUCOSE 91 80  BUN 24*  --   CREATININE 7.34*  --   CALCIUM 8.6*  --    No results for input(s): PTH in the last 72 hours. Iron Studies: No results for input(s): IRON, TIBC, TRANSFERRIN, FERRITIN in the last 72 hours.  Studies/Results: No results found.  I have reviewed the patient's current medications.  Assessment/Plan: 1 ESRD  For HD tomorrow.  [place] RN to eval if have chair big enough so can accept or not 2 Anemia esa,Fe 3 HPTH vit D, sensipar 4 Fistula superficialized and branch tied off. 5 DM controlled 6 Massive obesity need to get in bariatrics 7 Debill to get up P Hd, mobilize , needs bariatric referral    LOS: 28 days   Ravleen Ries L 11/02/2015,9:41 AM

## 2015-11-02 NOTE — Progress Notes (Signed)
PROGRESS NOTE  [name]  [id number] DOB: [birth year] DOA: 10/04/2015 PCP: Dwan Bolt, MD  Brief Narrative:  Patient was admitted on 10/04/2015, with complaint of shortness of breath progressively worsening without any chest pain or cough or fever or chills, was found to have acute on chronic diastolic dysfunction with elevated troponin and elevated creatinine. Patient also had hyperkalemia with metabolic acidosis. Patient was treated conservatively with Lasix in the ER but later on transferred to [facility] on admission. Initial AV fistula hemodialysis was not successful and temporary hemodialysis catheter was placed. As per vascular surgery the AV fistula will be superficialized. Currently further plan is continue hemodialysis and arrange for outpatient discharge HD. Limiting factor in why the patient is still hospitalized due to difficulty finding an outpt HD center able to accommodate patient due to her size. Her strength has been improving, although she will need SNF at discharge.    Assessment & Plan:   Principal Problem:   Acute pulmonary edema (HCC) Active Problems:   Morbid obesity ([place])   Renal failure (ARF), acute on chronic (HCC)   Anemia in chronic kidney disease   Type 2 diabetes mellitus with renal complication (HCC)   Hypertension   Elevated troponin   Hypoglycemia   Occult blood positive stool   Chronic anemia   ESRD on dialysis ([place])   Acute respiratory failure with hypoxia (HCC)   Constipation   Hematochezia   Acute hypoxic respiratory failure due to Acute pulmonary edema (HCC)  - Oxygenation improved with fluid removal with HD - stable on RA -pulmonary edema more a function of renal failure rather than CHF  -10/26/15--discussed with cardiology--pt has not received any Entresto since admission and has preserved EF--hold entresto until f/u with Dr. Irish Lack  New ESRD, Hemodialysis started on 10/05/15 - Left arm fistula placed in 07/25/2015.  Too deep. - Tunneled HD catheter on 10/09/15. - AV fistula revision completed 6/1  Hematochezia -10/29/2015 Patient developed small to moderate amount of hematochezia stool -Feb 2011 colonoscopy--diverticulosis, hyperplastic polyps -trend CBC--Hgb stable thus far -holding heparin [place] and ASA -appreciate [facility]-->intensify bowel regimen, hold off endoscopy for now unless further hematochezia -10/30/15--BM without blood  Generalized fatigue and weakness with physical deconditioning. - Likely due to morbid obesity and prolonged hospital stay - TSH & cortisol level unremarkable. - To SNF for PT/OT  Elevated troponin - Troponin was 11 on May 6, but this was likely a false positive. - 10/07/15 Echo-- EF-- 55-60%, no WMA; EKG does not show any acute ischemic changes. - Cardiology has signed off  Acute encephalopathy due to uremia and sedating medications, resolved..  Essential hypertension, blood pressures low normal and hypotensive during HD -d/c metoprolol  Anemia of chronic kidney disease. - TSH within normal limits. -Iron saturation 26%, ferritin 47, folic acid 123456 - 0000000 elevated. - Transfused 1 unit on May of 9  Obesity hypoventilation syndrome. - continue home CPAP.  Constipation, resolved.  Continue miralax and senna.  Impaired glucose tolerance -HbA1c--6.2 -previously on victoza prior to admission -CBGs controlled  DVT prophylaxis:  SCD Code Status:  full Family Communication:  Patient alone Disposition Plan:   Awaiting HD center for discharge  Consultants:   Nephrology  Cardiology  Vascular surgery  Gastroenterology  Wound care  Procedures:  Started HD  Antimicrobials:   none    Subjective:  Feels well, no compliants.  Denies SOB and swelling.  OOB to chair later today  Objective: Filed Vitals:   11/01/15 2014 11/02/15  0451 11/02/15 0746 11/02/15 1616  BP: 92/52 102/60 99/73 99/57   Pulse: 108 100 105 100  Temp: 98.3 F (36.8 C) 97.3 F  (36.3 C) 98.3 F (36.8 C) 98.5 F (36.9 C)  TempSrc: Oral Axillary Oral Oral  Resp: 18 19 18 18   Height:      Weight: 182.346 kg (402 lb)     SpO2: 92% 92% 100% 96%    Intake/Output Summary (Last 24 hours) at 11/02/15 1851 Last data filed at 11/02/15 1441  Gross per 24 hour  Intake    480 ml  Output      0 ml  Net    480 ml   Filed Weights   11/01/15 0743 11/01/15 1243 11/01/15 2014  Weight: 151 kg (332 lb 14.3 oz) 150 kg (330 lb 11 oz) 182.346 kg (402 lb)    Examination:  General exam:  Adult female.  No acute distress.   HEENT:  NCAT, MMM Respiratory system: Clear to auscultation bilaterally Cardiovascular system: Regular rate and rhythm, normal S1/S2. No murmurs, rubs, gallops or clicks.  Warm extremities Gastrointestinal system: Normal active bowel sounds, soft, nondistended, nontender. MSK:  Normal tone and bulk, no lower extremity edema Left arm with fistula without surrounding erythema or induration, + thrill and bruit    Data Reviewed: I have personally reviewed following labs and imaging studies  CBC:  Recent Labs Lab 10/27/15 0709 10/29/15 1514 10/30/15 0834 11/01/15 1432  WBC 7.8 8.3 9.1  --   HGB 10.6* 10.9* 10.2* 12.9  HCT 34.8* 36.1 33.4* 38.0  MCV 88.5 89.8 90.3  --   PLT 251 292 276  --    Basic Metabolic Panel:  Recent Labs Lab 10/27/15 0709 10/30/15 0834 11/01/15 0019 11/01/15 1432  NA 134* 138 131* 132*  K 3.9 4.5 4.1 4.2  CL 96* 99* 94*  --   CO2 28 29 25   --   GLUCOSE 96 86 91 80  BUN 27* 33* 24*  --   CREATININE 7.34* 8.88* 7.34*  --   CALCIUM 9.3 9.0 8.6*  --   PHOS 3.0 2.9  --   --    GFR: Estimated Creatinine Clearance: 14.3 mL/min (by C-G formula based on Cr of 7.34). Liver Function Tests:  Recent Labs Lab 10/27/15 0709 10/30/15 0834  ALBUMIN 2.6* 2.6*   No results for input(s): LIPASE, AMYLASE in the last 168 hours. No results for input(s): AMMONIA in the last 168 hours. Coagulation Profile: No results for  input(s): INR, PROTIME in the last 168 hours. Cardiac Enzymes: No results for input(s): CKTOTAL, CKMB, CKMBINDEX, TROPONINI in the last 168 hours. BNP (last 3 results) No results for input(s): PROBNP in the last 8760 hours. HbA1C: No results for input(s): HGBA1C in the last 72 hours. CBG:  Recent Labs Lab 11/01/15 1720 11/01/15 2014 11/02/15 0742 11/02/15 1142 11/02/15 1640  GLUCAP 79 111* 92 119* 103*   Lipid Profile: No results for input(s): CHOL, HDL, LDLCALC, TRIG, CHOLHDL, LDLDIRECT in the last 72 hours. Thyroid Function Tests: No results for input(s): TSH, T4TOTAL, FREET4, T3FREE, THYROIDAB in the last 72 hours. Anemia Panel: No results for input(s): VITAMINB12, FOLATE, FERRITIN, TIBC, IRON, RETICCTPCT in the last 72 hours. Urine analysis: No results found for: COLORURINE, APPEARANCEUR, LABSPEC, PHURINE, GLUCOSEU, HGBUR, BILIRUBINUR, KETONESUR, PROTEINUR, UROBILINOGEN, NITRITE, LEUKOCYTESUR Sepsis Labs: @LABRCNTIP (procalcitonin:4,lacticidven:4)  )No results found for this or any previous visit (from the past 240 hour(s)).    Radiology Studies: No results found.   Scheduled Meds: .  alteplase  2 mg Intracatheter Once  . atorvastatin  40 mg Oral Daily  . calcitRIOL  1 mcg Oral Q T,Th,Sa-HD  . cinacalcet  60 mg Oral Q supper  . darbepoetin (ARANESP) injection - DIALYSIS  100 mcg Intravenous Q Thu-HD  . febuxostat  40 mg Oral Daily  . multivitamin with minerals  1 tablet Oral Daily  . neomycin-bacitracin-polymyxin   Topical Daily  . polyethylene glycol  17 g Oral BID  . senna-docusate  2 tablet Oral BID  . sevelamer carbonate  2,400 mg Oral TID WC  . sodium chloride flush  3 mL Intravenous Q12H   Continuous Infusions:    LOS: 28 days    Time spent: 30 min    Janece Canterbury, MD [facility] Pager [phone]  If 7PM-7AM, please contact night-coverage [email or web address] Password [facility] 11/02/2015, 6:51 PM

## 2015-11-02 NOTE — Care Management Important Message (Signed)
Important Message  Patient Details  Name: [name] MRN: [id number] Date of Birth: [birth year]   Medicare Important Message Given:  Yes    [name] 11/02/2015, 11:06 AM

## 2015-11-02 NOTE — Clinical Social Work Note (Signed)
CSW continuing to follow patient's progress and will continue to offer support and other services as needed to patient/family as needed and will facilitate discharge to a skilled facility [facility]) when medically stable.  Ruther Ephraim Givens, MSW, LCSW Licensed Clinical Social Worker [place] [phone]

## 2015-11-03 DIAGNOSIS — E662 Morbid (severe) obesity with alveolar hypoventilation: Secondary | ICD-10-CM

## 2015-11-03 LAB — RENAL FUNCTION PANEL
ANION GAP: 11 (ref 5–15)
Albumin: 2.6 g/dL — ABNORMAL LOW (ref 3.5–5.0)
BUN: 20 mg/dL (ref 6–20)
CALCIUM: 8.3 mg/dL — AB (ref 8.9–10.3)
CHLORIDE: 97 mmol/L — AB (ref 101–111)
CO2: 28 mmol/L (ref 22–32)
CREATININE: 7.41 mg/dL — AB (ref 0.44–1.00)
GFR calc non Af Amer: 5 mL/min — ABNORMAL LOW (ref >60)
GFR, EST AFRICAN AMERICAN: 6 mL/min — AB (ref >60)
Glucose, Bld: 87 mg/dL (ref 65–99)
PHOSPHORUS: 2.8 mg/dL (ref 2.5–4.6)
Potassium: 3.8 mmol/L (ref 3.5–5.1)
Sodium: 136 mmol/L (ref 135–145)

## 2015-11-03 LAB — CBC
HCT: 32.7 % — ABNORMAL LOW (ref 36.0–46.0)
Hemoglobin: 10 g/dL — ABNORMAL LOW (ref 12.0–15.0)
MCH: 27.2 pg (ref 26.0–34.0)
MCHC: 30.6 g/dL (ref 30.0–36.0)
MCV: 88.9 fL (ref 78.0–100.0)
PLATELETS: 199 10*3/uL (ref 150–400)
RBC: 3.68 MIL/uL — AB (ref 3.87–5.11)
RDW: 16.3 % — ABNORMAL HIGH (ref 11.5–15.5)
WBC: 7 10*3/uL (ref 4.0–10.5)

## 2015-11-03 MED ORDER — ACETAMINOPHEN 325 MG PO TABS
ORAL_TABLET | ORAL | Status: AC
  Filled 2015-11-03: qty 2

## 2015-11-03 MED ORDER — CALCITRIOL 0.5 MCG PO CAPS
ORAL_CAPSULE | ORAL | Status: AC
  Filled 2015-11-03: qty 2

## 2015-11-03 NOTE — Procedures (Signed)
I was present at this session.  I have reviewed the session itself and made appropriate changes.  HD via PC, flows 300-350.  Bps low 100s  Gladis Soley L 6/3/20179:23 AM

## 2015-11-03 NOTE — Progress Notes (Signed)
Subjective: Interval History: has no complaint, interested in bariatrics.  Objective: Vital signs in last 24 hours: Temp:  [97 F (36.1 C)-98.7 F (37.1 C)] 97 F (36.1 C) (06/03 0820) Pulse Rate:  [95-105] 96 (06/03 0905) Resp:  [18] 18 (06/03 0820) BP: (92-135)/(54-88) 123/83 mmHg (06/03 0905) SpO2:  [94 %-96 %] 96 % (06/03 0820) Weight:  [150.6 kg (332 lb 0.2 oz)] 150.6 kg (332 lb 0.2 oz) (06/03 0820) Weight change:   Intake/Output from previous day: 06/02 0701 - 06/03 0700 In: 600 [P.O.:600] Out: 0  Intake/Output this shift:    General appearance: alert, cooperative, no distress and morbidly obese Resp: diminished breath sounds bilaterally Chest wall: RIJ cath Cardio: S1, S2 normal and systolic murmur: holosystolic 2/6, blowing at apex GI: massive, pos bs, soft Extremities: AVF LUA B&T, not a lot or swelling  Lab Results:  Recent Labs  11/01/15 1432 11/03/15 0835  WBC  --  7.0  HGB 12.9 10.0*  HCT 38.0 32.7*  PLT  --  199   BMET:  Recent Labs  11/01/15 0019 11/01/15 1432 11/03/15 0836  NA 131* 132* 136  K 4.1 4.2 3.8  CL 94*  --  97*  CO2 25  --  28  GLUCOSE 91 80 87  BUN 24*  --  20  CREATININE 7.34*  --  7.41*  CALCIUM 8.6*  --  8.3*   No results for input(s): PTH in the last 72 hours. Iron Studies: No results for input(s): IRON, TIBC, TRANSFERRIN, FERRITIN in the last 72 hours.  Studies/Results: No results found.  I have reviewed the patient's current medications.  Assessment/Plan: 1 ESRD on HD.  eval for SGSO 2 Massive obesity ? Bariatric process 3 DM controlled 4 Anemia on esa/Fe 5 HPTH vit D. sensipar 6 Debill taking steps P HD, eval for SGSO, Bariatric process, esa/Fe, Vit D, Sensipar    LOS: 29 days   [name] L 11/03/2015,9:24 AM

## 2015-11-03 NOTE — Progress Notes (Signed)
PROGRESS NOTE  [name]  [id number] DOB: 1955/12/22 DOA: 10/04/2015 PCP: Dwan Bolt, MD  Brief Narrative:  Patient was admitted on 10/04/2015, with complaint of shortness of breath progressively worsening without any chest pain or cough or fever or chills, was found to have acute on chronic diastolic dysfunction with elevated troponin and elevated creatinine. Patient also had hyperkalemia with metabolic acidosis. Patient was treated conservatively with Lasix in the ER but later on transferred to [facility] on admission. Initial AV fistula hemodialysis was not successful and temporary hemodialysis catheter was placed. As per vascular surgery the AV fistula will be superficialized. Currently further plan is continue hemodialysis and arrange for outpatient discharge HD. Limiting factor in why the patient is still hospitalized due to difficulty finding an outpt HD center able to accommodate patient due to her size. Her strength has been improving, although she will need SNF at discharge.    Assessment & Plan:   Principal Problem:   Acute pulmonary edema (HCC) Active Problems:   Morbid obesity ([place])   Renal failure (ARF), acute on chronic (HCC)   Anemia in chronic kidney disease   Type 2 diabetes mellitus with renal complication (HCC)   Hypertension   Elevated troponin   Hypoglycemia   Occult blood positive stool   Chronic anemia   ESRD on dialysis ([place])   Acute respiratory failure with hypoxia (HCC)   Constipation   Hematochezia   Acute hypoxic respiratory failure due to Acute pulmonary edema (HCC) from acute on chronic renal failure - Oxygenation improved with fluid removal with HD - stable on RA  New ESRD, Hemodialysis started on 10/05/15 - Left arm fistula placed in 07/25/2015. Too deep. - Tunneled HD catheter on 10/09/15. - AV fistula revision completed 6/1  Hematochezia -10/29/2015 Patient developed small to moderate amount of hematochezia stool -Feb  2011 colonoscopy--diverticulosis, hyperplastic polyps -held heparin [place] and ASA -appreciate [facility]-->intensify bowel regimen, hold off endoscopy for now unless further hematochezia -10/30/15--BM without blood  Generalized fatigue and weakness with physical deconditioning. - Likely due to morbid obesity and prolonged hospital stay - TSH & cortisol level unremarkable. - To SNF for PT/OT  Elevated troponin - Troponin was 11 on May 6, but this was likely a false positive. - 10/07/15 Echo-- EF-- 55-60%, no WMA; EKG does not show any acute ischemic changes. - Cardiology has signed off  Acute encephalopathy due to uremia and sedating medications, resolved..  Essential hypertension, blood pressures low normal and hypotensive during HD -  BP medications discontinued  Anemia of chronic kidney disease. - TSH within normal limits. -Iron saturation 26%, ferritin 47, folic acid 33.6 - P-22 elevated. - Transfused 1 unit on May of 9  Obesity hypoventilation syndrome. - continue home CPAP.  Constipation, resolved.  Continue miralax and senna.  Impaired glucose tolerance -HbA1c--6.2 -previously on victoza prior to admission -CBGs controlled  Morbid obesity Body mass index is 53.19 kg/(m^2). -  Patient has met with nutritionist regarding dietary changes -  Amenable to bariatric referral > called today but only on-call surgeon available.  Defer to Monday.  DVT prophylaxis:  SCD Code Status:  full Family Communication:  Patient alone Disposition Plan:   Awaiting HD center for discharge  Consultants:   Nephrology  Cardiology  Vascular surgery  Gastroenterology  Wound care  Procedures:  Started HD  Antimicrobials:   none    Subjective:  Feels well, no compliants.  Denies SOB and swelling.  Feels stronger and was able to  walk to the nursing station yesterday  Objective: Filed Vitals:   11/03/15 1315 11/03/15 1333 11/03/15 1402 11/03/15 1547  BP: 135/86 142/97 132/85  92/58  Pulse: 104 104 101 112  Temp:   98.4 F (36.9 C) 98.4 F (36.9 C)  TempSrc:   Oral Oral  Resp:   18 17  Height:      Weight:   149.4 kg (329 lb 5.9 oz)   SpO2:   98% 94%    Intake/Output Summary (Last 24 hours) at 11/03/15 1603 Last data filed at 11/03/15 1526  Gross per 24 hour  Intake    120 ml  Output   1001 ml  Net   -881 ml   Filed Weights   11/01/15 2014 11/03/15 0820 11/03/15 1402  Weight: 182.346 kg (402 lb) 150.6 kg (332 lb 0.2 oz) 149.4 kg (329 lb 5.9 oz)    Examination:  General exam:  Adult female.  No acute distress.   HEENT:  NCAT, MMM Respiratory system: Clear to auscultation bilaterally Cardiovascular system: Regular rate and rhythm, normal S1/S2. No murmurs, rubs, gallops or clicks.  Warm extremities Gastrointestinal system: Normal active bowel sounds, soft, nondistended, nontender. MSK:  Normal tone and bulk, no lower extremity edema Left arm with fistula without surrounding erythema or induration, + thrill and bruit.  Dressing with dried blood    Data Reviewed: I have personally reviewed following labs and imaging studies  CBC:  Recent Labs Lab 10/29/15 1514 10/30/15 0834 11/01/15 1432 11/03/15 0835  WBC 8.3 9.1  --  7.0  HGB 10.9* 10.2* 12.9 10.0*  HCT 36.1 33.4* 38.0 32.7*  MCV 89.8 90.3  --  88.9  PLT 292 276  --  149   Basic Metabolic Panel:  Recent Labs Lab 10/30/15 0834 11/01/15 0019 11/01/15 1432 11/03/15 0836  NA 138 131* 132* 136  K 4.5 4.1 4.2 3.8  CL 99* 94*  --  97*  CO2 29 25  --  28  GLUCOSE 86 91 80 87  BUN 33* 24*  --  20  CREATININE 8.88* 7.34*  --  7.41*  CALCIUM 9.0 8.6*  --  8.3*  PHOS 2.9  --   --  2.8   GFR: Estimated Creatinine Clearance: 12.5 mL/min (by C-G formula based on Cr of 7.41). Liver Function Tests:  Recent Labs Lab 10/30/15 0834 11/03/15 0836  ALBUMIN 2.6* 2.6*   No results for input(s): LIPASE, AMYLASE in the last 168 hours. No results for input(s): AMMONIA in the last 168  hours. Coagulation Profile: No results for input(s): INR, PROTIME in the last 168 hours. Cardiac Enzymes: No results for input(s): CKTOTAL, CKMB, CKMBINDEX, TROPONINI in the last 168 hours. BNP (last 3 results) No results for input(s): PROBNP in the last 8760 hours. HbA1C: No results for input(s): HGBA1C in the last 72 hours. CBG:  Recent Labs Lab 11/01/15 1720 11/01/15 2014 11/02/15 0742 11/02/15 1142 11/02/15 1640  GLUCAP 79 111* 92 119* 103*   Lipid Profile: No results for input(s): CHOL, HDL, LDLCALC, TRIG, CHOLHDL, LDLDIRECT in the last 72 hours. Thyroid Function Tests: No results for input(s): TSH, T4TOTAL, FREET4, T3FREE, THYROIDAB in the last 72 hours. Anemia Panel: No results for input(s): VITAMINB12, FOLATE, FERRITIN, TIBC, IRON, RETICCTPCT in the last 72 hours. Urine analysis: No results found for: COLORURINE, APPEARANCEUR, LABSPEC, PHURINE, GLUCOSEU, HGBUR, BILIRUBINUR, KETONESUR, PROTEINUR, UROBILINOGEN, NITRITE, LEUKOCYTESUR Sepsis Labs: _0 (procalcitonin:4,lacticidven:4)  )No results found for this or any previous visit (from the past 240 hour(s)).  Radiology Studies: No results found.   Scheduled Meds: . alteplase  2 mg Intracatheter Once  . atorvastatin  40 mg Oral Daily  . calcitRIOL  1 mcg Oral Q T,Th,Sa-HD  . cinacalcet  60 mg Oral Q supper  . darbepoetin (ARANESP) injection - DIALYSIS  100 mcg Intravenous Q Thu-HD  . febuxostat  40 mg Oral Daily  . multivitamin with minerals  1 tablet Oral Daily  . neomycin-bacitracin-polymyxin   Topical Daily  . polyethylene glycol  17 g Oral BID  . senna-docusate  2 tablet Oral BID  . sevelamer carbonate  2,400 mg Oral TID WC  . sodium chloride flush  3 mL Intravenous Q12H   Continuous Infusions:    LOS: 29 days    Time spent: 30 min    Janece Canterbury, MD [facility] Pager [phone]  If 7PM-7AM, please contact night-coverage [email or web address] Password [email or web address] 11/03/2015, 4:03 PM

## 2015-11-04 DIAGNOSIS — J9601 Acute respiratory failure with hypoxia: Secondary | ICD-10-CM

## 2015-11-04 LAB — GLUCOSE, CAPILLARY: Glucose-Capillary: 95 mg/dL (ref 65–99)

## 2015-11-04 NOTE — Procedures (Signed)
Pt is using home cpap machine with home settings.RT will continue to monitor.

## 2015-11-04 NOTE — Progress Notes (Signed)
Subjective: Interval History: has complaints wants to get up more, wants to be involved in bariatrics.  Objective: Vital signs in last 24 hours: Temp:  [98.4 F (36.9 C)-98.8 F (37.1 C)] 98.4 F (36.9 C) (06/04 0855) Pulse Rate:  [99-113] 107 (06/04 0855) Resp:  [16-18] 18 (06/04 0855) BP: (92-146)/(58-97) 137/71 mmHg (06/04 0855) SpO2:  [94 %-99 %] 96 % (06/04 0855) Weight:  [149.4 kg (329 lb 5.9 oz)-149.7 kg (330 lb 0.5 oz)] 149.7 kg (330 lb 0.5 oz) (06/04 0631) Weight change:   Intake/Output from previous day: 06/03 0701 - 06/04 0700 In: 240 [P.O.:240] Out: 1001  Intake/Output this shift:    General appearance: alert, cooperative, no distress and morbidly obese Resp: diminished breath sounds bilaterally Chest wall: RIJ cath Cardio: S1, S2 normal and systolic murmur: holosystolic 2/6, blowing at apex GI: massive pos bs, soft Extremities: AVF LUA B&T  Lab Results:  Recent Labs  11/01/15 1432 11/03/15 0835  WBC  --  7.0  HGB 12.9 10.0*  HCT 38.0 32.7*  PLT  --  199   BMET:  Recent Labs  11/01/15 1432 11/03/15 0836  NA 132* 136  K 4.2 3.8  CL  --  97*  CO2  --  28  GLUCOSE 80 87  BUN  --  20  CREATININE  --  7.41*  CALCIUM  --  8.3*   No results for input(s): PTH in the last 72 hours. Iron Studies: No results for input(s): IRON, TIBC, TRANSFERRIN, FERRITIN in the last 72 hours.  Studies/Results: No results found.  I have reviewed the patient's current medications.  Assessment/Plan: 1 ESRD  HD TTS. 5 h. To be eval for SGKD 2 DM controlled 3 Anemia esa 4 HPTH on Vit D, cinn 5 Massive obesity  Bariatric referra 6 Debill mobilize P Hd, esa, mobilize, bariatric eval    LOS: 30 days   Maddilyn Campus L 11/04/2015,10:00 AM

## 2015-11-04 NOTE — Progress Notes (Signed)
PROGRESS NOTE  [name]  MBT:[id number] DOB: April 11, 1956 DOA: 10/04/2015 PCP: Dwan Bolt, MD  Brief Narrative:  Patient was admitted on 10/04/2015, with complaint of shortness of breath progressively worsening without any chest pain or cough or fever or chills, was found to have acute on chronic diastolic dysfunction with elevated troponin and elevated creatinine. Patient also had hyperkalemia with metabolic acidosis. Patient was treated conservatively with Lasix in the ER but later on transferred to [facility] on admission. Initial AV fistula hemodialysis was not successful and temporary hemodialysis catheter was placed. As per vascular surgery the AV fistula will be superficialized. Currently further plan is continue hemodialysis and arrange for outpatient discharge HD. Limiting factor in why the patient is still hospitalized due to difficulty finding an outpt HD center able to accommodate patient due to her size. Her strength has been improving, although she will need SNF at discharge.    Assessment & Plan:   Principal Problem:   Acute pulmonary edema (HCC) Active Problems:   Morbid obesity ([place])   Renal failure (ARF), acute on chronic (HCC)   Anemia in chronic kidney disease   Type 2 diabetes mellitus with renal complication (HCC)   Hypertension   Elevated troponin   Hypoglycemia   Occult blood positive stool   Chronic anemia   ESRD on dialysis ([place])   Acute respiratory failure with hypoxia (HCC)   Constipation   Hematochezia   Acute hypoxic respiratory failure due to Acute pulmonary edema (HCC) from acute on chronic renal failure, resolved with HD.  New ESRD, Hemodialysis started on 10/05/15 - Left arm fistula placed in 07/25/2015. Too deep. - Tunneled HD catheter on 10/09/15. - AV fistula revision completed 6/1 - Awaiting assignment of outpatient HD center  Generalized fatigue and weakness with physical deconditioning. - Likely due to morbid obesity and  prolonged hospital stay - TSH & cortisol level unremarkable. - To SNF for PT/OT -  Ambulate in halls BID  Elevated troponin - Troponin was 11 on May 6, but this was likely a false positive. - 10/07/15 Echo-- EF-- 55-60%, no WMA; EKG does not show any acute ischemic changes. - Cardiology has signed off  Acute encephalopathy due to uremia and sedating medications, resolved..  Essential hypertension, blood pressures low normal and hypotensive during HD -  BP medications discontinued  Anemia of chronic kidney disease. - TSH within normal limits. -Iron saturation 26%, ferritin 47, folic acid 53.6 - I-68 elevated. - Transfused 1 unit on May of 9  Hematochezia, resolved -10/29/2015 Patient developed small to moderate amount of hematochezia stool -Feb 2011 colonoscopy--diverticulosis, hyperplastic polyps -held heparin [place] and ASA -appreciate [facility]-->intensify bowel regimen, hold off endoscopy for now unless further hematochezia -10/30/15--BM without blood  Obesity hypoventilation syndrome. - continue home CPAP.  Constipation, resolved.  Continue miralax and senna.  Impaired glucose tolerance -HbA1c--6.2 -previously on victoza prior to admission -CBGs controlled  Morbid obesity Body mass index is 53.29 kg/(m^2). -  Patient has met with nutritionist regarding dietary changes -  Amenable to bariatric referral >  Defer to Monday.  DVT prophylaxis:  SCD Code Status:  full Family Communication:  Patient alone Disposition Plan:   Awaiting HD center for discharge  Consultants:   Nephrology  Cardiology  Vascular surgery  Gastroenterology  Wound care  Procedures:  Started HD  Antimicrobials:   none    Subjective:  Feels well, no compliants.  Denies SOB and swelling.  Feels stronger but wasn't able to get out  of bed yesterday because nurses were too busy.  Asks if family can get her out of bed.  Objective: Filed Vitals:   11/03/15 2115 11/04/15 0541 11/04/15 0631  11/04/15 0855  BP: 144/85 125/80  137/71  Pulse: 113 102  107  Temp: 98.8 F (37.1 C) 98.8 F (37.1 C)  98.4 F (36.9 C)  TempSrc: Oral Oral  Oral  Resp: _0 Height:      Weight:   149.7 kg (330 lb 0.5 oz)   SpO2: 99% 95%  96%    Intake/Output Summary (Last 24 hours) at 11/04/15 1640 Last data filed at 11/04/15 1343  Gross per 24 hour  Intake    483 ml  Output     52 ml  Net    431 ml   Filed Weights   11/03/15 0820 11/03/15 1402 11/04/15 0631  Weight: 150.6 kg (332 lb 0.2 oz) 149.4 kg (329 lb 5.9 oz) 149.7 kg (330 lb 0.5 oz)    Examination:  General exam:  Adult female.  No acute distress.   HEENT:  NCAT, MMM Respiratory system: Clear to auscultation bilaterally Cardiovascular system: Regular rate and rhythm, normal S1/S2. No murmurs, rubs, gallops or clicks.  Warm extremities Gastrointestinal system: Normal active bowel sounds, soft, nondistended, nontender. MSK:  Normal tone and bulk, no lower extremity edema Left arm with fistula without surrounding erythema or induration, + thrill and bruit.  Dressing with dried blood    Data Reviewed: I have personally reviewed following labs and imaging studies  CBC:  Recent Labs Lab 10/29/15 1514 10/30/15 0834 11/01/15 1432 11/03/15 0835  WBC 8.3 9.1  --  7.0  HGB 10.9* 10.2* 12.9 10.0*  HCT 36.1 33.4* 38.0 32.7*  MCV 89.8 90.3  --  88.9  PLT 292 276  --  751   Basic Metabolic Panel:  Recent Labs Lab 10/30/15 0834 11/01/15 0019 11/01/15 1432 11/03/15 0836  NA 138 131* 132* 136  K 4.5 4.1 4.2 3.8  CL 99* 94*  --  97*  CO2 29 25  --  28  GLUCOSE 86 91 80 87  BUN 33* 24*  --  20  CREATININE 8.88* 7.34*  --  7.41*  CALCIUM 9.0 8.6*  --  8.3*  PHOS 2.9  --   --  2.8   GFR: Estimated Creatinine Clearance: 12.5 mL/min (by C-G formula based on Cr of 7.41). Liver Function Tests:  Recent Labs Lab 10/30/15 0834 11/03/15 0836  ALBUMIN 2.6* 2.6*   No results for input(s): LIPASE, AMYLASE in the last  168 hours. No results for input(s): AMMONIA in the last 168 hours. Coagulation Profile: No results for input(s): INR, PROTIME in the last 168 hours. Cardiac Enzymes: No results for input(s): CKTOTAL, CKMB, CKMBINDEX, TROPONINI in the last 168 hours. BNP (last 3 results) No results for input(s): PROBNP in the last 8760 hours. HbA1C: No results for input(s): HGBA1C in the last 72 hours. CBG:  Recent Labs Lab 11/01/15 1720 11/01/15 2014 11/02/15 0742 11/02/15 1142 11/02/15 1640  GLUCAP 79 111* 92 119* 103*   Lipid Profile: No results for input(s): CHOL, HDL, LDLCALC, TRIG, CHOLHDL, LDLDIRECT in the last 72 hours. Thyroid Function Tests: No results for input(s): TSH, T4TOTAL, FREET4, T3FREE, THYROIDAB in the last 72 hours. Anemia Panel: No results for input(s): VITAMINB12, FOLATE, FERRITIN, TIBC, IRON, RETICCTPCT in the last 72 hours. Urine analysis: No results found for: COLORURINE, APPEARANCEUR, LABSPEC, [place], GLUCOSEU, HGBUR, BILIRUBINUR, KETONESUR, PROTEINUR, UROBILINOGEN, NITRITE,  LEUKOCYTESUR Sepsis Labs: _0 (procalcitonin:4,lacticidven:4)  )No results found for this or any previous visit (from the past 240 hour(s)).    Radiology Studies: No results found.   Scheduled Meds: . alteplase  2 mg Intracatheter Once  . atorvastatin  40 mg Oral Daily  . calcitRIOL  1 mcg Oral Q T,Th,Sa-HD  . cinacalcet  60 mg Oral Q supper  . darbepoetin (ARANESP) injection - DIALYSIS  100 mcg Intravenous Q Thu-HD  . febuxostat  40 mg Oral Daily  . multivitamin with minerals  1 tablet Oral Daily  . neomycin-bacitracin-polymyxin   Topical Daily  . polyethylene glycol  17 g Oral BID  . senna-docusate  2 tablet Oral BID  . sevelamer carbonate  2,400 mg Oral TID WC  . sodium chloride flush  3 mL Intravenous Q12H   Continuous Infusions:    LOS: 30 days    Time spent: 30 min    Janece Canterbury, MD [facility] Pager [phone]  If 7PM-7AM, please contact  night-coverage [email or web address] Password [email or web address] 11/04/2015, 4:40 PM

## 2015-11-04 NOTE — Discharge Summary (Addendum)
Physician Discharge Summary  [name] ELF:[id number] DOB: January 28, 1956 DOA: 10/04/2015  PCP: Dwan Bolt, MD  Admit date: 10/04/2015 Discharge date: 11/06/2015  Recommendations for Outpatient Follow-up:  1. Follow-up at the [facility] at 9:30 AM on Thursday, dialysis sessions will normally be a 10:40 a.m on a Tuesday, Thursday, Saturday schedule 2. Ongoing physical and occupational therapy 3. Patient can call [phone] to schedule an appointment at the bariatric seminar 4. Follow-up at the vascular vein center in 2 weeks for staple removal 5. Follow-up with the vascular and vein center with Dr. Bridgett Larsson in 4 weeks for a postoperative visit 6. [facility] in 1 month for hematochezia 7. Follow-up with cardiology on 11/09/2015 at already scheduled appointment  Discharge Diagnoses:  Principal Problem:   Renal failure (ARF), acute on chronic [facility]) Active Problems:   Morbid obesity ([place])   Acute pulmonary edema (HCC)   Anemia in chronic kidney disease   Type 2 diabetes mellitus with renal complication (HCC)   Hypertension   Elevated troponin   Hypoglycemia   Occult blood positive stool   Chronic anemia   ESRD on dialysis ([place])   Acute respiratory failure with hypoxia ([place])   Constipation   Hematochezia   Discharge Condition: stable, improved  Diet recommendation: renal dialysis  Wt Readings from Last 3 Encounters:  11/06/15 147.7 kg (325 lb 9.9 oz)  10/03/15 186.882 kg (412 lb)  09/19/15 180.486 kg (397 lb 14.4 oz)    History of present illness:   Patient was admitted on 10/04/2015, with complaint of progressively worsening shortness of breath without chest pain, cough, fever or chills, was found to have acute on chronic diastolic dysfunction and acute renal failure. She also had hyperkalemia with metabolic acidosis. Patient was treated conservatively with Lasix, however she became uremic with worsening renal failure.  Initially there was concern that she may  be having an end STEMI because she had a very elevated troponin during her period of metabolic encephalopathy, however this was deemed to be a false positive. Cardiology was consulted but later signed off. Her mentation gradually improved after she had placement of a temporary hemodialysis catheter and started hemodialysis. Although she previously had an AV fistula placed in February 2017 it was too deep and could not be used for hemodialysis. She was able to undergo fistula revision on 6/1 by Dr. Bridgett Larsson from vascular surgery.  Due to her severe illness at the time of admission and from prolonged hospitalization she became very debilitated. Fortunately, over the last few weeks, she has started to regain her strength and has been ambulating the halls. Physical and occupational therapy have recommended transfer to a skilled nursing facility for ongoing therapy. There was some difficulty finding a hemodialysis center secondary to her body habitus and weight. The patient has already lost over 100 pounds with diet and exercise, and is intending to continue her weight loss.  She has been given information for bariatric seminar but she is already enrolled in a weight loss program as an outpatient that seems to be working for her.  She is highly motivated.  Hospital Course:   Acute hypoxic respiratory failure due to Acute pulmonary edema (HCC) from acute on chronic renal failure.  She did not respond to Lasix and started hemodialysis with improvement of her symptoms.   She has been stable on room air.  New ESRD, Hemodialysis started on 10/05/15 - Left arm fistula placed in 07/25/2015 was too deep to use  - Tunneled HD catheter  on 10/09/15. - AV fistula revision completed 6/1 - Awaiting assignment of outpatient HD center  Generalized fatigue and weakness with physical deconditioning, strength is rapidly improving. - Likely due to morbid obesity and prolonged hospital stay - TSH & cortisol level unremarkable. - To  SNF for PT/OT  Elevated troponin, likely secondary to end-stage renal disease/acute renal failure - Troponin was 11 on May 6, but this was likely lab error.  The troponins before and after the troponin of 11 were both minimally elevated.   - 10/07/15 Echo-- EF-- 55-60%, no WMA; EKG does not show any acute ischemic changes. - Cardiology recommended medical management.   -  Continued aspirin and statin, but held beta blocker due to hypotension.  Acute metabolic encephalopathy due to uremia and sedating medications, resolved with holding sedating medications and starting hemodialysis.  Essential hypertension, blood pressures low normal and hypotensive during HD - BP medications discontinued  Anemia of chronic kidney disease. - TSH within normal limits. -Iron saturation 26%, ferritin 47, folic acid 85.6 - D-14 elevated. - Transfused 1 unit on May of 9  Hematochezia, resolved -10/29/2015 Patient developed small to moderate amount of hematochezia stool -Feb 2011 colonoscopy--diverticulosis, hyperplastic polyps -held heparin [place] and ASA -appreciate [facility]-->intensify bowel regimen, hold off endoscopy for now unless further hematochezia -10/30/15--BM without blood  Obesity hypoventilation syndrome. - continue home CPAP.  Constipation, resolved. Continue miralax and senna.  Impaired glucose tolerance/prediabetes -HbA1c--6.2 -previously on victoza prior to admission -CBGs controlled - repeat A1c in 3 months  Morbid obesity Body mass index is 53.29 kg/(m^2). - Patient has met with nutritionist regarding dietary changes -  Recommended that she continue with her current outpatient weight loss program as she is already lost over 100 pounds with this method.  She was provided information to attend a bariatric seminar if she is interested.  Consultants:   Nephrology  Cardiology  Vascular surgery  Gastroenterology  Wound care  Procedures:  Started HD  Antimicrobials:    none  Discharge Exam: Filed Vitals:   11/06/15 1235 11/06/15 1327  BP: 125/99 113/62  Pulse: 113 113  Temp: 97.3 F (36.3 C) 98.4 F (36.9 C)  Resp: 18 17   Filed Vitals:   11/06/15 1210 11/06/15 1229 11/06/15 1235 11/06/15 1327  BP: 138/87 135/86 125/99 113/62  Pulse: 98 98 113 113  Temp:   97.3 F (36.3 C) 98.4 F (36.9 C)  TempSrc:   Oral Oral  Resp:   18 17  Height:      Weight:   147.7 kg (325 lb 9.9 oz)   SpO2:    97%   General exam: Adult female. No acute distress.  HEENT: NCAT, MMM Respiratory system: Clear to auscultation bilaterally Cardiovascular system: Regular rate and rhythm, normal S1/S2. No murmurs, rubs, gallops or clicks. Warm extremities Gastrointestinal system: Normal active bowel sounds, soft, nondistended, nontender. MSK: Normal tone and bulk, no lower extremity edema Left arm with fistula without surrounding erythema or induration, + thrill and bruit. Right IJ tunneled hemodialysis catheter   Discharge Instructions      Discharge Instructions    (HEART FAILURE PATIENTS) Call MD:  Anytime you have any of the following symptoms: 1) 3 pound weight gain in 24 hours or 5 pounds in 1 week 2) shortness of breath, with or without a dry hacking cough 3) swelling in the hands, feet or stomach 4) if you have to sleep on extra pillows at night in order to breathe.    Complete by:  As directed      Call MD for:  difficulty breathing, headache or visual disturbances    Complete by:  As directed      Call MD for:  extreme fatigue    Complete by:  As directed      Call MD for:  hives    Complete by:  As directed      Call MD for:  persistant dizziness or light-headedness    Complete by:  As directed      Call MD for:  persistant nausea and vomiting    Complete by:  As directed      Call MD for:  redness, tenderness, or signs of infection (pain, swelling, redness, odor or green/yellow discharge around incision site)    Complete by:  As directed       Call MD for:  severe uncontrolled pain    Complete by:  As directed      Call MD for:  temperature >100.4    Complete by:  As directed      Discharge wound care:    Complete by:  As directed   Staple removal on 6/14.     Increase activity slowly    Complete by:  As directed             Medication List    STOP taking these medications        amLODipine 10 MG tablet  Commonly known as:  NORVASC     cloNIDine 0.3 MG tablet  Commonly known as:  CATAPRES     furosemide 80 MG tablet  Commonly known as:  LASIX     metoprolol succinate 100 MG 24 hr tablet  Commonly known as:  TOPROL-XL     multivitamins ther. w/minerals Tabs tablet     sacubitril-valsartan 24-26 MG  Commonly known as:  ENTRESTO     VICTOZA 18 MG/3ML Sopn  Generic drug:  Liraglutide     VITAMIN D PO      TAKE these medications        aspirin 81 MG tablet  Take 81 mg by mouth daily.     atorvastatin 40 MG tablet  Commonly known as:  LIPITOR  Take 40 mg by mouth daily.     calcitRIOL 0.5 MCG capsule  Commonly known as:  ROCALTROL  Take 2 capsules (1 mcg total) by mouth Every Tuesday,Thursday,and Saturday with dialysis.     cinacalcet 30 MG tablet  Commonly known as:  SENSIPAR  Take 2 tablets (60 mg total) by mouth daily with supper.     febuxostat 40 MG tablet  Commonly known as:  ULORIC  Take 40 mg by mouth daily.     multivitamin Tabs tablet  Take 1 tablet by mouth daily.     polyethylene glycol packet  Commonly known as:  MIRALAX / GLYCOLAX  Take 17 g by mouth 2 (two) times daily.     senna-docusate 8.6-50 MG tablet  Commonly known as:  Senokot-S  Take 2 tablets by mouth 2 (two) times daily.       Follow-up Information    Follow up with Adele Barthel, MD In 4 weeks.   Specialties:  Vascular Surgery, Cardiology   Why:  Office will call you to arrange your appt (sent)   Contact information:   [address] [place] [address] [phone]       Follow up with Cecilie Kicks, NP  On 11/09/2015.   Specialties:  Cardiology, Radiology  Why:  @ 1:30pm   Contact information:   [place] [address] [phone]       Follow up with Dwan Bolt, MD.   Specialty:  Endocrinology   Why:  As needed   Contact information:   [address] [place] [address] [phone]        The results of significant diagnostics from this hospitalization (including imaging, microbiology, ancillary and laboratory) are listed below for reference.    Significant Diagnostic Studies: Dg Chest 2 View  10/13/2015  CLINICAL DATA:  60 year old female with shortness of breath. EXAM: CHEST  2 VIEW COMPARISON:  Chest x-ray 10/09/2015. FINDINGS: Right internal jugular Vas-Cath with tip terminating in the mid to distal superior vena cava. Lung volumes are slightly low. There is cephalization of the pulmonary vasculature and slight indistinctness of the interstitial markings suggestive of mild pulmonary edema. No pleural effusions. Mild cardiomegaly. Upper mediastinal contours are within normal limits allowing for patient rotation to the right and lordotic positioning. IMPRESSION: 1. The appearance of the chest suggests mild congestive heart failure, as above. Electronically Signed   By: Vinnie Langton M.D.   On: 10/13/2015 13:40   Dg Chest Port 1 View  10/26/2015  CLINICAL DATA:  Cough, hypertension EXAM: PORTABLE CHEST 1 VIEW COMPARISON:  10/13/2015 FINDINGS: Right dialysis catheter remains in place, unchanged. Heart is borderline in size. No confluent airspace opacities, effusions or edema. No acute bony abnormality. IMPRESSION: No active disease. Electronically Signed   By: Rolm Baptise M.D.   On: 10/26/2015 07:09   Dg Chest Port 1 View  10/09/2015  CLINICAL DATA:  Dialysis catheter insertion EXAM: PORTABLE CHEST 1 VIEW COMPARISON:  10/08/2015 FINDINGS: Right IJ dialysis catheter tips SVC RA junction. No pneumothorax or effusion. Stable  cardiomegaly and central vascular congestion. No focal pneumonia, collapse or consolidation. Degenerative changes of the spine. Stable hilar prominence as before. IMPRESSION: Right IJ dialysis catheter tips SVC RA junction. Stable cardiomegaly and central vascular congestion No complicating feature. Electronically Signed   By: Jerilynn Mages.  Shick M.D.   On: 10/09/2015 16:25   Dg Chest Port 1 View  10/08/2015  CLINICAL DATA:  Cough EXAM: PORTABLE CHEST 1 VIEW COMPARISON:  10/05/2015 FINDINGS: Cardiomegaly again noted. Central mild vascular congestion without convincing pulmonary edema. Mild left basilar atelectasis. Right IJ central line is unchanged in position. IMPRESSION: Central mild vascular congestion without convincing pulmonary edema. Mild left basilar atelectasis. Cardiomegaly again noted. Electronically Signed   By: Lahoma Crocker M.D.   On: 10/08/2015 08:16   Dg Abd Portable 1v  10/15/2015  CLINICAL DATA:  Abdominal pain. EXAM: PORTABLE ABDOMEN - 1 VIEW COMPARISON:  10/14/2015 FINDINGS: Examination is limited due to patient's body habitus. Visualized abdomen demonstrates gas and stool in the colon. No small or large bowel distention. Calcification in the right upper quadrant consistent with gallstone. Surgical clips in the right lower quadrant. No radiopaque stones identified. IMPRESSION: Nonobstructive bowel gas pattern with scattered gas and stool in the colon. Cholelithiasis. Electronically Signed   By: Lucienne Capers M.D.   On: 10/15/2015 02:34   Dg Abd Portable 1v  10/14/2015  CLINICAL DATA:  Constipation EXAM: PORTABLE ABDOMEN - 1 VIEW COMPARISON:  CT abdomen 05/02/2009 FINDINGS: Image quality limited by large patient size Distended stomach.  Mildly distended colon.  Small bowel nondilated. Right upper quadrant calcification compatible gallstone as noted on CT. IMPRESSION: Limited exam.  Mild colonic ileus. Electronically Signed   By: Juanda Crumble  Carlis Abbott M.D.   On: 10/14/2015 13:47    Microbiology: No  results found for this or any previous visit (from the past 240 hour(s)).   Labs: Basic Metabolic Panel:  Recent Labs Lab 11/01/15 0019 11/01/15 1432 11/03/15 0836 11/06/15 0745  NA 131* 132* 136 135  K 4.1 4.2 3.8 4.3  CL 94*  --  97* 96*  CO2 25  --  28 30  GLUCOSE 91 80 87 97  BUN 24*  --  20 25*  CREATININE 7.34*  --  7.41* 8.13*  CALCIUM 8.6*  --  8.3* 8.2*  PHOS  --   --  2.8 2.4*   Liver Function Tests:  Recent Labs Lab 11/03/15 0836 11/06/15 0745  ALBUMIN 2.6* 2.6*   No results for input(s): LIPASE, AMYLASE in the last 168 hours. No results for input(s): AMMONIA in the last 168 hours. CBC:  Recent Labs Lab 11/01/15 1432 11/03/15 0835 11/06/15 0745  WBC  --  7.0 6.2  HGB 12.9 10.0* 10.0*  HCT 38.0 32.7* 33.4*  MCV  --  88.9 89.8  PLT  --  199 245   Cardiac Enzymes: No results for input(s): CKTOTAL, CKMB, CKMBINDEX, TROPONINI in the last 168 hours. BNP: BNP (last 3 results)  Recent Labs  10/04/15 2319  BNP 464.4*    ProBNP (last 3 results) No results for input(s): PROBNP in the last 8760 hours.  CBG:  Recent Labs Lab 11/01/15 2014 11/02/15 0742 11/02/15 1142 11/02/15 1640 11/04/15 2048  GLUCAP 111* 92 119* 103* 95    Time coordinating discharge: 45 minutes  Signed:  Maija Biggers  [facility] 11/06/2015, 3:20 PM

## 2015-11-05 ENCOUNTER — Telehealth: Payer: Self-pay | Admitting: Vascular Surgery

## 2015-11-05 NOTE — Care Management Important Message (Signed)
Important Message  Patient Details  Name: [name] MRN: [id number] Date of Birth: Oct 27, 1955   Medicare Important Message Given:  Yes    [name] 11/05/2015, 12:06 PM

## 2015-11-05 NOTE — Progress Notes (Signed)
11/05/2015 3:18 PM Hemodialysis Outpatient Note; this patient has been accepted at the [place] center on a Tuesday, Thursday and Saturday schedule. Due to the extended treatment time a "chair time" is not readily available at this time. I will update as soon as I receive the chair time. Thank you. Gordy Savers

## 2015-11-05 NOTE — Clinical Social Work Note (Signed)
CSW noted in nephrology note and confirmed with Bethena Roys in dialysis center that patient will go to [facility] for dialysis upon discharge. The dialysis time has not been set-up yet. CSW talked with patient and her daughter at the bedside and updated them and also contacted Santiago Glad at [facility] with update.  Patient will be ready for discharge once dialysis set-up is complete.  [name], MSW, LCSW Licensed Clinical Social Worker [place] [phone]

## 2015-11-05 NOTE — Telephone Encounter (Signed)
-----   Message from Mena Goes, RN sent at 11/02/2015  9:08 AM EDT ----- Regarding: schedule   ----- Message -----    From: Conrad [place], MD    Sent: 11/01/2015   8:09 PM      To: [address]  MIKYRA NIVENS [id number] [birth year]  Follow-up: 2 weeks (staple removal with nurse), 4 weeks post-op visit

## 2015-11-05 NOTE — Telephone Encounter (Signed)
Sched staple removal 6/16 at 9:45. Sched md f/u 6/30 at 8:45. Spoke to pt to inform them of appts.

## 2015-11-05 NOTE — Progress Notes (Signed)
Physical Therapy Treatment Patient Details Name: [name] MRN: [id number] DOB: 08-Jun-1955 Today's Date: 11-23-15    History of Present Illness 60 year old female presenting with 10 days of progressive dyspnea. Now new to HD;  PMH is significant for CKD, HTN, T2DM, and seizure disorder    PT Comments    Assisted pt OOB to amb a greater distance then assisted on/off BSC.  Assisted with hygiene then amb to recliner.    Follow Up Recommendations  SNF     Equipment Recommendations       Recommendations for Other Services       Precautions / Restrictions Precautions Precautions: Fall Restrictions Weight Bearing Restrictions: No    Mobility  Bed Mobility Overal bed mobility: Needs Assistance Bed Mobility: Supine to Sit     Supine to sit: Supervision     General bed mobility comments: increased time and use of rail  Transfers Overall transfer level: Needs assistance Equipment used: Rolling walker (2 wheeled) Transfers: Sit to/from Stand Sit to Stand: Min guard Stand pivot transfers: Min guard       General transfer comment: Dependent on momentum to stand, but not needing phsyical power-up assist; decr control with stand to sit due to fatigue.  also assisted on and off BSC using forward momentum weight shift  Ambulation/Gait Ambulation/Gait assistance: Min guard Ambulation Distance (Feet): 75 Feet Assistive device: Rolling walker (2 wheeled) Gait Pattern/deviations: Step-to pattern;Step-through pattern;Decreased step length - right;Decreased step length - left;Wide base of support Gait velocity: decreased but functional   General Gait Details: tolerated increased distance but needed x 2 standing rest breaks.  Pt highly motivated.  "I got todo thisTourist information centre manager [place] (Stroke Patients Only)       Balance                                    Cognition Arousal/Alertness:  Awake/alert Behavior During Therapy: WFL for tasks assessed/performed Overall Cognitive Status: Within Functional Limits for tasks assessed                      Exercises      General Comments        Pertinent Vitals/Pain Pain Assessment: No/denies pain    Home Living                      Prior Function            PT Goals (current goals can now be found in the care plan section) Progress towards PT goals: Progressing toward goals    Frequency  Min 3X/week    PT Plan Current plan remains appropriate    Co-evaluation             End of Session   Activity Tolerance: Patient tolerated treatment well Patient left: in chair     Time: 1004-1032 PT Time Calculation (min) (ACUTE ONLY): 28 min  Charges:  $Gait Training: 8-22 mins $Therapeutic Activity: 8-22 mins                    G Codes:      Nathanial Rancher 11/23/15, 10:36 AM

## 2015-11-05 NOTE — Progress Notes (Signed)
PROGRESS NOTE  [name]  [id number] DOB: Jun 23, 1955 DOA: 10/04/2015 PCP: Dwan Bolt, MD  Brief Narrative:  Patient was admitted on 10/04/2015, with complaint of shortness of breath progressively worsening without any chest pain or cough or fever or chills, was found to have acute on chronic diastolic dysfunction with elevated troponin and elevated creatinine. Patient also had hyperkalemia with metabolic acidosis. Patient was treated conservatively with Lasix in the ER but later on transferred to [facility] on admission. Initial AV fistula hemodialysis was not successful and temporary hemodialysis catheter was placed. As per vascular surgery the AV fistula will be superficialized. Currently further plan is continue hemodialysis and arrange for outpatient discharge HD. Limiting factor in why the patient is still hospitalized due to difficulty finding an outpt HD center able to accommodate patient due to her size. Her strength has been improving, although she will need SNF at discharge.    Assessment & Plan:   Principal Problem:   Acute pulmonary edema (HCC) Active Problems:   Morbid obesity ([place])   Renal failure (ARF), acute on chronic (HCC)   Anemia in chronic kidney disease   Type 2 diabetes mellitus with renal complication (HCC)   Hypertension   Elevated troponin   Hypoglycemia   Occult blood positive stool   Chronic anemia   ESRD on dialysis ([place])   Acute respiratory failure with hypoxia (HCC)   Constipation   Hematochezia   Acute hypoxic respiratory failure due to Acute pulmonary edema (HCC) from acute on chronic renal failure, resolved with HD.  New ESRD, Hemodialysis started on 10/05/15 - Left arm fistula placed in 07/25/2015. Too deep. - Tunneled HD catheter on 10/09/15. - AV fistula revision completed 6/1 - f/u with Dr. Bridgett Larsson in 2 weeks for staple removal with RN and 4 weeks for post-op visit - Awaiting assignment of time at [facility]  center  Generalized fatigue and weakness with physical deconditioning. - Likely due to morbid obesity and prolonged hospital stay - TSH & cortisol level unremarkable. - To SNF for PT/OT -  Ambulate in halls BID  Elevated troponin - Troponin was 11 on May 6, but this was likely a false positive. - 10/07/15 Echo-- EF-- 55-60%, no WMA; EKG does not show any acute ischemic changes. - Cardiology has signed off  Acute encephalopathy due to uremia and sedating medications, resolved..  Essential hypertension, blood pressures low normal and hypotensive during HD -  BP medications discontinued  Anemia of chronic kidney disease. - TSH within normal limits. -Iron saturation 26%, ferritin 47, folic acid 90.2 - I-09 elevated. - Transfused 1 unit on May of 9  Hematochezia, resolved -10/29/2015 Patient developed small to moderate amount of hematochezia stool -Feb 2011 colonoscopy--diverticulosis, hyperplastic polyps -held heparin [place] and ASA -appreciate [facility]-->intensify bowel regimen, hold off endoscopy for now unless further hematochezia -10/30/15--BM without blood  Obesity hypoventilation syndrome. - continue home CPAP.  Constipation, resolved.  Continue miralax and senna.  Impaired glucose tolerance -HbA1c--6.2 -previously on victoza prior to admission -CBGs controlled  Morbid obesity Body mass index is 53.29 kg/(m^2). -  Patient has met with nutritionist regarding dietary changes -  Amenable to bariatric referral >  Defer to Monday.  DVT prophylaxis:  SCD Code Status:  full Family Communication:  Patient alone Disposition Plan:   Awaiting HD center for discharge  Consultants:   Nephrology  Cardiology  Vascular surgery  Gastroenterology  Wound care  Procedures:  Started HD  Antimicrobials:   none  Subjective:  Feels well, no compliants.  Able to walk around some today and practice standing up and sitting down.  Was previously able to lose over 100-lbs with  diet and exercise on her own.  Wants to avoid weight loss surgery if possible  Objective: Filed Vitals:   11/04/15 0855 11/04/15 2100 11/05/15 0850 11/05/15 1812  BP: 137/71 127/75 135/74 116/67  Pulse: 107 105 100 107  Temp: 98.4 F (36.9 C) 98.3 F (36.8 C) 98.1 F (36.7 C) 98.7 F (37.1 C)  TempSrc: Oral Oral Oral Oral  Resp: '18 16 18 17  ' Height:      Weight:      SpO2: 96% 98% 99% 94%    Intake/Output Summary (Last 24 hours) at 11/05/15 1949 Last data filed at 11/05/15 1840  Gross per 24 hour  Intake    843 ml  Output      0 ml  Net    843 ml   Filed Weights   11/03/15 0820 11/03/15 1402 11/04/15 0631  Weight: 150.6 kg (332 lb 0.2 oz) 149.4 kg (329 lb 5.9 oz) 149.7 kg (330 lb 0.5 oz)    Examination:  General exam:  Adult female.  No acute distress.   HEENT:  NCAT, MMM Respiratory system: Clear to auscultation bilaterally Cardiovascular system: Regular rate and rhythm, normal S1/S2. No murmurs, rubs, gallops or clicks.  Warm extremities Gastrointestinal system: Normal active bowel sounds, soft, nondistended, nontender. MSK:  Normal tone and bulk, no lower extremity edema Left arm with fistula without surrounding erythema or induration, + thrill and bruit.    Data Reviewed: I have personally reviewed following labs and imaging studies  CBC:  Recent Labs Lab 10/30/15 0834 11/01/15 1432 11/03/15 0835  WBC 9.1  --  7.0  HGB 10.2* 12.9 10.0*  HCT 33.4* 38.0 32.7*  MCV 90.3  --  88.9  PLT 276  --  350   Basic Metabolic Panel:  Recent Labs Lab 10/30/15 0834 11/01/15 0019 11/01/15 1432 11/03/15 0836  NA 138 131* 132* 136  K 4.5 4.1 4.2 3.8  CL 99* 94*  --  97*  CO2 29 25  --  28  GLUCOSE 86 91 80 87  BUN 33* 24*  --  20  CREATININE 8.88* 7.34*  --  7.41*  CALCIUM 9.0 8.6*  --  8.3*  PHOS 2.9  --   --  2.8   GFR: Estimated Creatinine Clearance: 12.5 mL/min (by C-G formula based on Cr of 7.41). Liver Function Tests:  Recent Labs Lab  10/30/15 0834 11/03/15 0836  ALBUMIN 2.6* 2.6*   No results for input(s): LIPASE, AMYLASE in the last 168 hours. No results for input(s): AMMONIA in the last 168 hours. Coagulation Profile: No results for input(s): INR, PROTIME in the last 168 hours. Cardiac Enzymes: No results for input(s): CKTOTAL, CKMB, CKMBINDEX, TROPONINI in the last 168 hours. BNP (last 3 results) No results for input(s): PROBNP in the last 8760 hours. HbA1C: No results for input(s): HGBA1C in the last 72 hours. CBG:  Recent Labs Lab 11/01/15 2014 11/02/15 0742 11/02/15 1142 11/02/15 1640 11/04/15 2048  GLUCAP 111* 92 119* 103* 95   Lipid Profile: No results for input(s): CHOL, HDL, LDLCALC, TRIG, CHOLHDL, LDLDIRECT in the last 72 hours. Thyroid Function Tests: No results for input(s): TSH, T4TOTAL, FREET4, T3FREE, THYROIDAB in the last 72 hours. Anemia Panel: No results for input(s): VITAMINB12, FOLATE, FERRITIN, TIBC, IRON, RETICCTPCT in the last 72 hours. Urine analysis: No results  found for: COLORURINE, APPEARANCEUR, LABSPEC, PHURINE, GLUCOSEU, HGBUR, BILIRUBINUR, KETONESUR, PROTEINUR, UROBILINOGEN, NITRITE, LEUKOCYTESUR Sepsis Labs: '@LABRCNTIP' (procalcitonin:4,lacticidven:4)  )No results found for this or any previous visit (from the past 240 hour(s)).    Radiology Studies: No results found.   Scheduled Meds: . alteplase  2 mg Intracatheter Once  . atorvastatin  40 mg Oral Daily  . calcitRIOL  1 mcg Oral Q T,Th,Sa-HD  . cinacalcet  60 mg Oral Q supper  . darbepoetin (ARANESP) injection - DIALYSIS  100 mcg Intravenous Q Thu-HD  . febuxostat  40 mg Oral Daily  . multivitamin with minerals  1 tablet Oral Daily  . neomycin-bacitracin-polymyxin   Topical Daily  . polyethylene glycol  17 g Oral BID  . senna-docusate  2 tablet Oral BID  . sevelamer carbonate  2,400 mg Oral TID WC  . sodium chloride flush  3 mL Intravenous Q12H   Continuous Infusions:    LOS: 31 days    Time spent: 30  min    Janece Canterbury, MD [facility] Pager [phone]  If 7PM-7AM, please contact night-coverage [email or web address] Password [facility] 11/05/2015, 7:49 PM

## 2015-11-05 NOTE — Progress Notes (Signed)
S: Walking some.  No new CO. O:BP 127/75 mmHg  Pulse 105  Temp(Src) 98.3 F (36.8 C) (Oral)  Resp 16  Ht 5\' 6"  (1.676 m)  Wt 149.7 kg (330 lb 0.5 oz)  BMI 53.29 kg/m2  SpO2 98%  Intake/Output Summary (Last 24 hours) at 11/05/15 1021 Last data filed at 11/05/15 0600  Gross per 24 hour  Intake    840 ml  Output     51 ml  Net    789 ml   Weight change:  Gen: Awake and alert CVS: RRR Resp: Clear Abd:+ BS NTND Ext:No edema.  LUA AVF SP superficialization, + bruit NEURO: CNI Ox3 no asterixis Rt IJ PC   . alteplase  2 mg Intracatheter Once  . atorvastatin  40 mg Oral Daily  . calcitRIOL  1 mcg Oral Q T,Th,Sa-HD  . cinacalcet  60 mg Oral Q supper  . darbepoetin (ARANESP) injection - DIALYSIS  100 mcg Intravenous Q Thu-HD  . febuxostat  40 mg Oral Daily  . multivitamin with minerals  1 tablet Oral Daily  . neomycin-bacitracin-polymyxin   Topical Daily  . polyethylene glycol  17 g Oral BID  . senna-docusate  2 tablet Oral BID  . sevelamer carbonate  2,400 mg Oral TID WC  . sodium chloride flush  3 mL Intravenous Q12H   No results found. BMET    Component Value Date/Time   NA 136 11/03/2015 0836   K 3.8 11/03/2015 0836   CL 97* 11/03/2015 0836   CO2 28 11/03/2015 0836   GLUCOSE 87 11/03/2015 0836   BUN 20 11/03/2015 0836   CREATININE 7.41* 11/03/2015 0836   CALCIUM 8.3* 11/03/2015 0836   CALCIUM 7.7* 10/07/2015 0355   GFRNONAA 5* 11/03/2015 0836   GFRAA 6* 11/03/2015 0836   CBC    Component Value Date/Time   WBC 7.0 11/03/2015 0835   RBC 3.68* 11/03/2015 0835   HGB 10.0* 11/03/2015 0835   HCT 32.7* 11/03/2015 0835   PLT 199 11/03/2015 0835   MCV 88.9 11/03/2015 0835   MCH 27.2 11/03/2015 0835   MCHC 30.6 11/03/2015 0835   RDW 16.3* 11/03/2015 0835   LYMPHSABS 1.4 10/17/2015 0511   MONOABS 1.1* 10/17/2015 0511   EOSABS 0.4 10/17/2015 0511   BASOSABS 0.0 10/17/2015 0511     Assessment: 1. ESRD  TTS.  Being evaluated for [facility] 2. Anemia on ESA 3. HPTH  on calcitriol/sensipar 4. DM  Plan: 1.  HD tomorrow.  She is accepted by [facility].  ? Awaiting SNF 2. Recheck labs with HD   Cynthia Cogle T

## 2015-11-05 NOTE — Progress Notes (Signed)
Pt has home machine and can place on when ready. Pt encouraged to call RT if needing any assistance.

## 2015-11-06 DIAGNOSIS — J9621 Acute and chronic respiratory failure with hypoxia: Secondary | ICD-10-CM | POA: Diagnosis not present

## 2015-11-06 DIAGNOSIS — D631 Anemia in chronic kidney disease: Secondary | ICD-10-CM | POA: Diagnosis not present

## 2015-11-06 DIAGNOSIS — K5900 Constipation, unspecified: Secondary | ICD-10-CM | POA: Diagnosis not present

## 2015-11-06 DIAGNOSIS — E119 Type 2 diabetes mellitus without complications: Secondary | ICD-10-CM | POA: Diagnosis not present

## 2015-11-06 DIAGNOSIS — Z992 Dependence on renal dialysis: Secondary | ICD-10-CM | POA: Diagnosis not present

## 2015-11-06 DIAGNOSIS — J810 Acute pulmonary edema: Secondary | ICD-10-CM | POA: Diagnosis not present

## 2015-11-06 DIAGNOSIS — E162 Hypoglycemia, unspecified: Secondary | ICD-10-CM | POA: Diagnosis not present

## 2015-11-06 DIAGNOSIS — I10 Essential (primary) hypertension: Secondary | ICD-10-CM | POA: Diagnosis not present

## 2015-11-06 DIAGNOSIS — J9601 Acute respiratory failure with hypoxia: Secondary | ICD-10-CM | POA: Diagnosis not present

## 2015-11-06 DIAGNOSIS — N179 Acute kidney failure, unspecified: Secondary | ICD-10-CM | POA: Diagnosis not present

## 2015-11-06 DIAGNOSIS — N189 Chronic kidney disease, unspecified: Secondary | ICD-10-CM | POA: Diagnosis not present

## 2015-11-06 DIAGNOSIS — N186 End stage renal disease: Secondary | ICD-10-CM | POA: Diagnosis not present

## 2015-11-06 DIAGNOSIS — M6281 Muscle weakness (generalized): Secondary | ICD-10-CM | POA: Diagnosis not present

## 2015-11-06 LAB — RENAL FUNCTION PANEL
ALBUMIN: 2.6 g/dL — AB (ref 3.5–5.0)
Anion gap: 9 (ref 5–15)
BUN: 25 mg/dL — ABNORMAL HIGH (ref 6–20)
CALCIUM: 8.2 mg/dL — AB (ref 8.9–10.3)
CO2: 30 mmol/L (ref 22–32)
CREATININE: 8.13 mg/dL — AB (ref 0.44–1.00)
Chloride: 96 mmol/L — ABNORMAL LOW (ref 101–111)
GFR calc non Af Amer: 5 mL/min — ABNORMAL LOW (ref >60)
GFR, EST AFRICAN AMERICAN: 6 mL/min — AB (ref >60)
GLUCOSE: 97 mg/dL (ref 65–99)
PHOSPHORUS: 2.4 mg/dL — AB (ref 2.5–4.6)
Potassium: 4.3 mmol/L (ref 3.5–5.1)
SODIUM: 135 mmol/L (ref 135–145)

## 2015-11-06 LAB — CBC
HCT: 33.4 % — ABNORMAL LOW (ref 36.0–46.0)
Hemoglobin: 10 g/dL — ABNORMAL LOW (ref 12.0–15.0)
MCH: 26.9 pg (ref 26.0–34.0)
MCHC: 29.9 g/dL — ABNORMAL LOW (ref 30.0–36.0)
MCV: 89.8 fL (ref 78.0–100.0)
PLATELETS: 245 10*3/uL (ref 150–400)
RBC: 3.72 MIL/uL — AB (ref 3.87–5.11)
RDW: 16.5 % — ABNORMAL HIGH (ref 11.5–15.5)
WBC: 6.2 10*3/uL (ref 4.0–10.5)

## 2015-11-06 MED ORDER — SENNOSIDES-DOCUSATE SODIUM 8.6-50 MG PO TABS: 2.0000 | ORAL_TABLET | Freq: Two times a day (BID) | ORAL | Status: DC

## 2015-11-06 MED ORDER — RENA-VITE PO TABS: 1.0000 | ORAL_TABLET | Freq: Every day | ORAL | Status: AC

## 2015-11-06 MED ORDER — SODIUM CHLORIDE 0.9 % IV SOLN: 100.0000 mL | INTRAVENOUS | Status: DC | PRN

## 2015-11-06 MED ORDER — CALCITRIOL 0.5 MCG PO CAPS: 1.0000 ug | ORAL_CAPSULE | ORAL | Status: DC

## 2015-11-06 MED ORDER — PENTAFLUOROPROP-TETRAFLUOROETH EX AERO: 1.0000 "application " | INHALATION_SPRAY | CUTANEOUS | Status: DC | PRN

## 2015-11-06 MED ORDER — ACETAMINOPHEN 325 MG PO TABS
ORAL_TABLET | ORAL | Status: AC
  Filled 2015-11-06: qty 2

## 2015-11-06 MED ORDER — POLYETHYLENE GLYCOL 3350 17 G PO PACK: 17.0000 g | PACK | Freq: Two times a day (BID) | ORAL | Status: DC

## 2015-11-06 MED ORDER — LIDOCAINE-PRILOCAINE 2.5-2.5 % EX CREA: 1.0000 "application " | TOPICAL_CREAM | CUTANEOUS | Status: DC | PRN

## 2015-11-06 MED ORDER — CINACALCET HCL 30 MG PO TABS: 60.0000 mg | ORAL_TABLET | Freq: Every day | ORAL | Status: DC

## 2015-11-06 MED ORDER — ALTEPLASE 2 MG IJ SOLR: 2.0000 mg | Freq: Once | INTRAMUSCULAR | Status: DC | PRN

## 2015-11-06 MED ORDER — HEPARIN SODIUM (PORCINE) 1000 UNIT/ML DIALYSIS: 1000.0000 [IU] | INTRAMUSCULAR | Status: DC | PRN

## 2015-11-06 MED ORDER — LIDOCAINE HCL (PF) 1 % IJ SOLN: 5.0000 mL | INTRAMUSCULAR | Status: DC | PRN

## 2015-11-06 MED ORDER — HEPARIN SODIUM (PORCINE) 1000 UNIT/ML DIALYSIS
40.0000 [IU]/kg | Freq: Once | INTRAMUSCULAR | Status: AC
  Administered 2015-11-06: 7300 [IU] via INTRAVENOUS_CENTRAL

## 2015-11-06 NOTE — Progress Notes (Addendum)
Attempted to call report to [facility] multiple times with no answer. Will attempt to call again in 15 minutes.

## 2015-11-06 NOTE — Progress Notes (Signed)
11/06/2015 1:23 PM Hemodialysis Outpatient Note-update;The chair time for this patient will be 10:40 AM however her FIRST day at the center she will need to arrive for 09:30 AM to sign paperwork and consents. She will be a Tuesday,Thursday and Saturday patient at the [facility]. Thank you. Gordy Savers

## 2015-11-06 NOTE — Procedures (Signed)
Pt seen on HD.  Ap 190 Vp 180  BFR 400. PO4 sl low so will Dc renvela.  Has spot at [facility] and awaiting NHP.  She can go from renal standpoint.

## 2015-11-06 NOTE — Progress Notes (Signed)
OT cancellation    11/06/15 1144  OT Visit Information  Last OT Received On 11/06/15  Reason Eval/Treat Not Completed Patient at procedure or test/ unavailable. At HD.    Roseanne Reno, OTR/L [phone]

## 2015-11-06 NOTE — Clinical Social Work Placement (Signed)
   CLINICAL SOCIAL WORK PLACEMENT  NOTE 11/06/15 - DISCHARGED TO [place]  Date:  11/06/2015  Patient Details  Name: [name] MRN: [id number] Date of Birth: 06-11-1955  Clinical Social Work is seeking post-discharge placement for this patient at the [place] level of care (*CSW will initial, date and re-position this form in  chart as items are completed):  No (Sister/patient has facility preference)   Patient/family provided with [place] Work Department's list of facilities offering this level of care within the geographic area requested by the patient (or if unable, by the patient's family).  Yes   Patient/family informed of their freedom to choose among providers that offer the needed level of care, that participate in Medicare, Medicaid or managed care program needed by the patient, have an available bed and are willing to accept the patient.  No   Patient/family informed of [facility]'s ownership interest in [facility] and [facility], as well as of the fact that they are under no obligation to receive care at these facilities.  PASRR submitted to EDS on       PASRR number received on       Existing PASRR number confirmed on 10/25/15     FL2 transmitted to all facilities in geographic area requested by pt/family on 10/19/15     FL2 transmitted to all facilities within larger geographic area on       Patient informed that his/her managed care company has contracts with or will negotiate with certain facilities, including the following:        Yes   Patient/family informed of bed offers received.  Patient chooses bed at [facility]     Physician recommends and patient chooses bed at      Patient to be transferred to [facility] on  11/06/15.  Patient to be transferred to facility by  ambulance     Patient family notified on  11/06/15 of transfer.  Name of family member notified:   Denyse Dago  - sister.     PHYSICIAN       Additional Comment:    _______________________________________________ Sable Feil, LCSW 11/06/2015, 4:49 PM

## 2015-11-09 ENCOUNTER — Ambulatory Visit: Payer: Self-pay | Admitting: Cardiology

## 2015-11-12 ENCOUNTER — Encounter: Payer: Self-pay | Admitting: Vascular Surgery

## 2015-11-16 ENCOUNTER — Ambulatory Visit (INDEPENDENT_AMBULATORY_CARE_PROVIDER_SITE_OTHER): Payer: Medicare Other | Admitting: Vascular Surgery

## 2015-11-16 ENCOUNTER — Encounter: Payer: Self-pay | Admitting: Vascular Surgery

## 2015-11-16 VITALS — BP 126/72 | HR 113 | Ht 66.0 in | Wt 325.0 lb

## 2015-11-16 DIAGNOSIS — N186 End stage renal disease: Secondary | ICD-10-CM

## 2015-11-16 DIAGNOSIS — Z992 Dependence on renal dialysis: Secondary | ICD-10-CM

## 2015-11-16 NOTE — Progress Notes (Signed)
    Postoperative Access Visit   History of Present Illness  [name] is a 60 y.o. year old female who presents for postoperative follow-up for: L BC AVF superficialization, SBL (Date: 11/01/15).  The patient's wounds are healed.  The patient notes no steal symptoms.  The patient is able to complete their activities of daily living.  The patient's current symptoms are: none.  For VQI Use Only  PRE-ADM LIVING: Home  AMB STATUS: Ambulatory  Physical Examination Filed Vitals:   11/16/15 0906  BP: 126/72  Pulse: 113    LUE: Incision is healed, skin feels warm, hand grip is 5/5, sensation in digits is intact, easily palpable thrill LATERAL to incision, bruit can be auscultated   Medical Decision Making  [name] is a 60 y.o. year old female who presents s/p L BC AVF superficialization .  The patient's access is ready for use.  The patient's tunneled dialysis catheter can be removed after two successful cannulations and completed dialysis treatments.  Thank you for allowing [place] to participate in this patient's care.  Adele Barthel, MD, FACS [facility] [place] Office: [phone] Pager: [phone]

## 2015-11-23 ENCOUNTER — Encounter: Payer: Self-pay | Admitting: Vascular Surgery

## 2015-11-28 NOTE — Progress Notes (Signed)
    Postoperative Access Visit   History of Present Illness  [name] is a 60 y.o. (31-Jan-1956) female  who presents for postoperative follow-up for: L BC AVF superficialization, SBL (Date: 11/01/15). Pt has no steal sx.  They have not started using the fistula.  For VQI Use Only  PRE-ADM LIVING: Home  AMB STATUS: Ambulatory  Physical Examination Filed Vitals:   11/30/15 0843  BP: 130/81  Pulse: 99  Height: 5\' 6"  (1.676 m)  Weight: 325 lb (147.419 kg)  SpO2: 95%   LUE: easily palpable fistula, with visibly pulsating segment lateral to incision   Medical Decision Making  [name] is a 60 y.o. (April 14, 1956) female  who presents s/p L BC AVF superficialization .  The patient's access is ready for use.  The patient's tunneled dialysis catheter can be removed after two successful cannulations and completed dialysis treatments.  Thank you for allowing [place] to participate in this patient's care.  Adele Barthel, MD, FACS [facility] [place] Office: [phone] Pager: [phone]

## 2015-11-30 ENCOUNTER — Encounter: Payer: Self-pay | Admitting: Surgery

## 2015-11-30 ENCOUNTER — Encounter: Payer: Self-pay | Admitting: Vascular Surgery

## 2015-11-30 ENCOUNTER — Ambulatory Visit (INDEPENDENT_AMBULATORY_CARE_PROVIDER_SITE_OTHER): Payer: Medicare Other | Admitting: Vascular Surgery

## 2015-11-30 VITALS — BP 130/81 | HR 99 | Ht 66.0 in | Wt 325.0 lb

## 2015-11-30 DIAGNOSIS — N186 End stage renal disease: Secondary | ICD-10-CM

## 2015-11-30 DIAGNOSIS — Z992 Dependence on renal dialysis: Secondary | ICD-10-CM

## 2015-12-02 NOTE — Progress Notes (Signed)
Cardiology Office Note:    Date:  12/03/2015   ID:  BEBE BASA, DOB May 05, 1956, MRN [id number]  PCP:  Dwan Bolt, MD  Cardiologist:  Dr. Casandra Doffing   Electrophysiologist:  N/a Nephrologist: Dr. Florene Glen  Referring MD: Anda Kraft, MD   Chief Complaint  Patient presents with  . Hospitalization Follow-up    CHF, started dialysis for ESRD    History of Present Illness:     [name] is a 60 y.o. female with a hx of advanced, stage 5 CKD, CHF, HTN, HL, OSA.  She was evaluated by Dr. Casandra Doffing on 10/03/15 for HF symptoms.  Plan was to adjust Lasix in conjunction with her Nephrologist and to obtain an Echocardiogram.  However, she was admitted 10/04/15-11/06/15 with acute on chronic diastolic CHF in the setting of worsening renal failure. She was managed with Lasix initially. She had elevated troponin levels and was evaluated by cardiology. She was ultimately started on hemodialysis. She required AV fistula revision with vascular surgery. She required transfusion with 1 unit of PRBCs secondary to worsening anemia. She was deconditioned given her prolonged admission and she was discharged to SNF. Of note, she did have one troponin value of 11. All other troponin values were minimally elevated without clear trend. Echocardiogram demonstrated normal LV function with severe LVH and normal wall motion. It was ultimately felt that the elevated troponin level of 11 was an erroneous lab result.  Medical therapy was recommended. All BP meds were held secondary to hypotension.    Returns for FU. Here with her brother.  She was DC from SNF and is now at home.  She is on dialysis every Tues, Thurs, Sat.  She notes that her BP has been running low and her HR has been running fast since DC.  She has her dialysis cut short at times.  She denies syncope or dizziness.  She denies chest pain or significant dyspnea.  She remains weak but is getting stronger.  She denies orthopnea, PND, edema.  Denies  bleeding issues.  She has discussed her low BP with her nephrologist.  No medication changes have been made.    Past Medical History  Diagnosis Date  . Hypertension     BP low after dialysis started, no further BP meds  . ESRD (end stage renal disease) (HCC)     Tues, Thurs, Sat dialysis  . HLD (hyperlipidemia)   . H/O blood clots     many years ago in leg  . Sleep apnea     uses c-pap most of time  . History of pneumonia   . Diabetes mellitus without complication ([place])     type 2  . Seizures ([place])     had one or two (more than 10-15 years ago)  . Arthritis   . Chronic diastolic CHF (congestive heart failure) ([place])     a. Echo 5/17: severe LVH, EF 55-60%, mild AS, mean AV gradient 10 mmHg, mod LAE, PASP 37 mmHg    Past Surgical History  Procedure Laterality Date  . Abdominal hysterectomy    . Colonoscopy    . Av fistula placement Left 07/25/2015    Procedure: ARTERIOVENOUS (AV) FISTULA CREATION -LEFT BRACHIOCEPHALIC;  Surgeon: Conrad Tuscarawas, MD;  Location: [facility];  Service: Vascular;  Laterality: Left;  . Insertion of dialysis catheter Right 10/09/2015    Procedure: INSERTION OF DIALYSIS CATHETER RIGHT INTERNAL JUGULAR ;  Surgeon: Angelia Mould, MD;  Location: [facility];  Service:  Vascular;  Laterality: Right;  . Fistula superficialization Left 11/01/2015    Procedure: SUPERFICIALIZATION OF LEFT ARM BRACHIOCEPHALIC FISTULA ;  Surgeon: Conrad Mayville, MD;  Location: [facility];  Service: Vascular;  Laterality: Left;    Current Medications: Outpatient Prescriptions Prior to Visit  Medication Sig Dispense Refill  . aspirin 81 MG tablet Take 81 mg by mouth daily.    Marland Kitchen atorvastatin (LIPITOR) 40 MG tablet Take 40 mg by mouth daily.    . calcitRIOL (ROCALTROL) 0.5 MCG capsule Take 2 capsules (1 mcg total) by mouth Every Tuesday,Thursday,and Saturday with dialysis. 12 capsule 0  . febuxostat (ULORIC) 40 MG tablet Take 40 mg by mouth daily.    . multivitamin (RENA-VIT) TABS tablet Take 1  tablet by mouth daily. 30 each 0  . polyethylene glycol (MIRALAX / GLYCOLAX) packet Take 17 g by mouth 2 (two) times daily. 14 each 0  . senna-docusate (SENOKOT-S) 8.6-50 MG tablet Take 2 tablets by mouth 2 (two) times daily. 120 tablet 0  . cinacalcet (SENSIPAR) 30 MG tablet Take 2 tablets (60 mg total) by mouth daily with supper. (Patient not taking: Reported on 12/03/2015) 30 tablet 0   No facility-administered medications prior to visit.      Allergies:   Morphine and related   Social History   Social History  . Marital Status: Single    Spouse Name: N/A  . Number of Children: N/A  . Years of Education: N/A   Social History Main Topics  . Smoking status: Never Smoker   . Smokeless tobacco: Never Used  . Alcohol Use: No  . Drug Use: No  . Sexual Activity: Not Asked   Other Topics Concern  . None   Social History Narrative     Family History:  The patient's family history includes Cancer in her mother; Heart attack in her paternal aunt and sister; Hypertension in her father. There is no history of Stroke.   ROS:   Please see the history of present illness.    Review of Systems  Respiratory: Positive for cough.    All other systems reviewed and are negative.   Physical Exam:    VS:  BP 82/60 mmHg  Pulse 124  Ht 5\' 6"  (1.676 m)  Wt 329 lb 1.9 oz (149.288 kg)  BMI 53.15 kg/m2   Physical Exam  Constitutional: She is oriented to person, place, and time. She appears well-developed and well-nourished.  HENT:  Head: Normocephalic and atraumatic.  Neck: No JVD present.  Cardiovascular: Normal rate, regular rhythm and normal heart sounds.  Exam reveals no gallop and no friction rub.   No murmur heard. Pulmonary/Chest: Effort normal and breath sounds normal. She has no wheezes. She has no rales.  Abdominal: Soft. She exhibits no distension. There is no tenderness.  Musculoskeletal: She exhibits no edema.  Neurological: She is alert and oriented to person, place, and  time.  Skin: Skin is warm and dry.  Psychiatric: She has a normal mood and affect.    Wt Readings from Last 3 Encounters:  12/03/15 329 lb 1.9 oz (149.288 kg)  11/30/15 325 lb (147.419 kg)  11/16/15 325 lb (147.419 kg)      Studies/Labs Reviewed:     EKG:  EKG is  ordered today.  The ekg ordered today demonstrates sinus tachycardia, HR 125, normal axis, QTc 450 ms  Recent Labs: 10/04/2015: B Natriuretic Peptide 464.4* 10/07/2015: TSH 1.541 10/12/2015: Magnesium 2.1 10/17/2015: ALT 28 11/06/2015: BUN 25*; Creatinine, Ser 8.13*;  Hemoglobin 10.0*; Platelets 245; Potassium 4.3; Sodium 135   Recent Lipid Panel No results found for: CHOL, TRIG, HDL, CHOLHDL, VLDL, LDLCALC, LDLDIRECT  Additional studies/ records that were reviewed today include:   Echo 10/07/15 - Left ventricle: The cavity size was normal. Wall thickness was   increased in a pattern of severe LVH. Systolic function was   normal. The estimated ejection fraction was in the range of 55%   to 60%. - Aortic valve: There was very mild stenosis. - Left atrium: The atrium was moderately dilated. - Right ventricle: Catheter in RV - Atrial septum: No defect or patent foramen ovale was identified. - Pulmonary arteries: PA peak pressure: 37 mm Hg (S).   ASSESSMENT:     1. Chronic diastolic CHF (congestive heart failure) ([place])   2. Sinus tachycardia ([place])   3. Hemodialysis-associated hypotension   4. ESRD on dialysis ([place])   5. Elevated troponin     PLAN:     In order of problems listed above:  1. Chronic diastolic CHF - Volume management per dialysis. Volume currently appears stable.  2. Sinus tachycardia - She is fairly asymptomatic. I suspect her elevated heart rate is related to her hypotension. TSH in May was normal. She is no longer on medication for blood pressure. I discussed her case today with Dr. Harrington Challenger. We will get 24-hour Holter monitor to assess her average heart rate.  3. Hypotension - Her low blood pressure  seems to be related to dialysis. Question if we should consider adding midodrine to her medical regimen. I will leave this up to her nephrologist.  4. ESRD - She is on hemodialysis every Tuesday, Thursday, Saturday.  5. Elevated troponin - She had one elevated troponin in the hospital that was 11 and appeared to be an erroneous lab value. All other troponin levels seemed to be consistent with demand ischemia in the setting of end-stage renal failure. Echocardiogram demonstrated normal LV function. She is not having anginal symptoms. At this point, stress testing does not seem to be indicated. Continue aspirin, statin.   Medication Adjustments/Labs and Tests Ordered: Current medicines are reviewed at length with the patient today.  Concerns regarding medicines are outlined above.  Medication changes, Labs and Tests ordered today are outlined in the Patient Instructions noted below. Patient Instructions  Medication Instructions:  Your physician recommends that you continue on your current medications as directed. Please refer to the Current Medication list given to you today. Labwork: NONE Testing/Procedures: Your physician has recommended that you wear a 24 HOUR holter monitor. Holter monitors are medical devices that record the heart's electrical activity. Doctors most often use these monitors to diagnose arrhythmias. Arrhythmias are problems with the speed or rhythm of the heartbeat. The monitor is a small, portable device. You can wear one while you do your normal daily activities. This is usually used to diagnose what is causing palpitations/syncope (passing out). Follow-Up: DR. VARANASI 3 MONTH FOLLOW UP; WE WILL SEND OUT A REMINDER LETTER  Any Other Special Instructions Will Be Listed Below (If Applicable). If you need a refill on your cardiac medications before your next appointment, please call your pharmacy.   Signed, Richardson Dopp, PA-C  12/03/2015 12:18 PM    [facility]  [facility] [address], [place], [place]  [address] Phone: [phone]; Fax: [phone]

## 2015-12-03 ENCOUNTER — Ambulatory Visit (INDEPENDENT_AMBULATORY_CARE_PROVIDER_SITE_OTHER): Payer: Medicare Other | Admitting: Physician Assistant

## 2015-12-03 ENCOUNTER — Encounter (INDEPENDENT_AMBULATORY_CARE_PROVIDER_SITE_OTHER): Payer: Self-pay

## 2015-12-03 ENCOUNTER — Encounter: Payer: Self-pay | Admitting: Physician Assistant

## 2015-12-03 VITALS — BP 82/60 | HR 124 | Ht 66.0 in | Wt 329.1 lb

## 2015-12-03 DIAGNOSIS — R778 Other specified abnormalities of plasma proteins: Secondary | ICD-10-CM

## 2015-12-03 DIAGNOSIS — R000 Tachycardia, unspecified: Secondary | ICD-10-CM | POA: Diagnosis not present

## 2015-12-03 DIAGNOSIS — N186 End stage renal disease: Secondary | ICD-10-CM | POA: Diagnosis not present

## 2015-12-03 DIAGNOSIS — I953 Hypotension of hemodialysis: Secondary | ICD-10-CM | POA: Diagnosis not present

## 2015-12-03 DIAGNOSIS — Z992 Dependence on renal dialysis: Secondary | ICD-10-CM

## 2015-12-03 DIAGNOSIS — I5032 Chronic diastolic (congestive) heart failure: Secondary | ICD-10-CM | POA: Diagnosis not present

## 2015-12-03 DIAGNOSIS — R7989 Other specified abnormal findings of blood chemistry: Secondary | ICD-10-CM

## 2015-12-03 NOTE — Patient Instructions (Addendum)
Medication Instructions:  Your physician recommends that you continue on your current medications as directed. Please refer to the Current Medication list given to you today. Labwork: NONE Testing/Procedures: Your physician has recommended that you wear a 24 HOUR holter monitor. Holter monitors are medical devices that record the heart's electrical activity. Doctors most often use these monitors to diagnose arrhythmias. Arrhythmias are problems with the speed or rhythm of the heartbeat. The monitor is a small, portable device. You can wear one while you do your normal daily activities. This is usually used to diagnose what is causing palpitations/syncope (passing out). Follow-Up: DR. VARANASI 3 MONTH FOLLOW UP; WE WILL SEND OUT A REMINDER LETTER  Any Other Special Instructions Will Be Listed Below (If Applicable). If you need a refill on your cardiac medications before your next appointment, please call your pharmacy.

## 2015-12-10 ENCOUNTER — Ambulatory Visit (INDEPENDENT_AMBULATORY_CARE_PROVIDER_SITE_OTHER): Payer: Medicare Other

## 2015-12-10 DIAGNOSIS — R000 Tachycardia, unspecified: Secondary | ICD-10-CM | POA: Diagnosis not present

## 2015-12-26 ENCOUNTER — Ambulatory Visit: Payer: Self-pay | Admitting: Cardiology

## 2015-12-28 ENCOUNTER — Telehealth: Payer: Self-pay | Admitting: Interventional Cardiology

## 2015-12-28 ENCOUNTER — Other Ambulatory Visit: Payer: Self-pay

## 2015-12-28 NOTE — Telephone Encounter (Signed)
**Note De-Identified Perseus Westall Obfuscation** The pt called me back and expressed her concerns about not taking Metoprolol on her dialysis days because she states that her HR continues to be elevated at 105 or greater during her dialysis. She is requesting that she continue to take Metoprolol 50 mg daily (even on dialysis days). She is advised to continue to take Metoprolol 50 mg daily and that if her HR starts to decrease during dialysis to let us know. She verbalized understanding and is in agreement with plan.

## 2015-12-28 NOTE — Telephone Encounter (Signed)
New message  Pt call requesting to speak with RN. Pt states she spoke with RN previously about some issues. Pt states she just wanted a call back. Please call back to discuss

## 2016-01-15 ENCOUNTER — Telehealth: Payer: Self-pay | Admitting: Physician Assistant

## 2016-01-15 NOTE — Telephone Encounter (Signed)
Received records from [facility] for appointment on 01/25/16 with Almyra Deforest, PA.  Records given to [employer] (medical records) for Hao's schedule on 01/25/16. lp

## 2016-01-25 ENCOUNTER — Ambulatory Visit (INDEPENDENT_AMBULATORY_CARE_PROVIDER_SITE_OTHER): Payer: Medicare Other | Admitting: Physician Assistant

## 2016-01-25 ENCOUNTER — Encounter: Payer: Self-pay | Admitting: Physician Assistant

## 2016-01-25 VITALS — BP 97/62 | HR 82 | Ht 66.0 in | Wt 332.6 lb

## 2016-01-25 DIAGNOSIS — Z992 Dependence on renal dialysis: Secondary | ICD-10-CM

## 2016-01-25 DIAGNOSIS — I953 Hypotension of hemodialysis: Secondary | ICD-10-CM | POA: Diagnosis not present

## 2016-01-25 DIAGNOSIS — N186 End stage renal disease: Secondary | ICD-10-CM

## 2016-01-25 DIAGNOSIS — I5032 Chronic diastolic (congestive) heart failure: Secondary | ICD-10-CM

## 2016-01-25 DIAGNOSIS — R000 Tachycardia, unspecified: Secondary | ICD-10-CM

## 2016-01-25 NOTE — Patient Instructions (Signed)
Your physician has recommended you make the following change in your medication:   Due to decrease in blood pressure- DO NOT TAKE METOPROLOL ER SUCC THE NIGHT PRIOR TO DIALYSIS.  We will contact you to follow up in 3 months with Dr. Claiborne Billings or Dr Berline Lopes.

## 2016-01-25 NOTE — Progress Notes (Signed)
Cardiology Office Note    Date:  01/25/2016   ID:  [name], DOB 06-12-1955, MRN [id number]  PCP:  Dwan Bolt, MD  Cardiologist:  Dr. Casandra Doffing Nephrologist: Dr. Florene Glen  Chief Complaint  Patient presents with  . Follow-up    seen for Dr. Irish Lack    History of Present Illness:  [name] is a 60 y.o. female with PMH of ESRD, CHF, HTN, HLD and OSA. Patient was evaluated by cardiology in the office on 10/03/2015 for heart failure symptoms. The plan was to adjust Lasix in conjunction with her nephrologist and to obtain an echocardiogram, however she was admitted on 10/04/2015 for acute on chronic diastolic heart failure in the setting of worsening renal failure. During that admission, she had elevated troponin level and was evaluated by cardiology service. Echocardiogram obtained on 10/07/2015 showed EF 55-60%, severe LVH, PA peak pressure 37 mmHg. She ultimately was started on hemodialysis TTS. She required AV fistula revision with vascular surgery. She required transfusion secondary to worsening anemia as well. Her admission was prolonged from 5/4 until 6/6, she was deconditioned due to prolonged admission and was discharged to skilled nursing facility. Despite most of her troponin were minimally elevated without clear trend, one of her troponin level was 11, this was ultimately attributed to an erroneous lab results. Medical therapy was recommended. All blood pressure medication was held secondary to hypotension.   She was last seen in the office on 12/03/2015, she was tachycardic at the time and also hypotensive. Her hypotension was thought to be related to dialysis. A 24 hour Holter monitor was placed to assess her tachycardia which showed minimal heart rate 104, maximal heart rate 144, average heart rate 122 during the 24 hours. Toprol-XL was recommended for the patient. He presents today for follow-up, she has been doing well with the exception that during dialysis days,  sometimes her dialysis session has to finish early because of hypotension and dizziness. Otherwise she denies any chest pain. While on the current dose of Toprol-XL, she normally does fine, her heart rate today is in the 80s. Blood pressure is 97/62. She has no dizziness. She says yesterday, her dialysis session also finished after 4 hours worse usually she will require 5 hours of dialysis. She says she had a little bit dizziness in the end. She takes her Toprol-XL at night. I have instructed her to hold the night dose of Toprol-XL prior to dialysis session. She wished to establish with Dr. Claiborne Billings at [facility] clinic instead. I will message her current cardiologist and Dr. Claiborne Billings to confirm before arrange follow-up in 3 month.    Past Medical History:  Diagnosis Date  . Arthritis   . Chronic diastolic CHF (congestive heart failure) ([place])    a. Echo 5/17: severe LVH, EF 55-60%, mild AS, mean AV gradient 10 mmHg, mod LAE, PASP 37 mmHg  . Diabetes mellitus without complication ([place])    type 2  . ESRD (end stage renal disease) (HCC)    Tues, Thurs, Sat dialysis  . H/O blood clots    many years ago in leg  . History of pneumonia   . HLD (hyperlipidemia)   . Hypertension    BP low after dialysis started, no further BP meds  . Seizures ([place])    had one or two (more than 10-15 years ago)  . Sleep apnea    uses c-pap most of time    Past Surgical History:  Procedure Laterality Date  .  ABDOMINAL HYSTERECTOMY    . AV FISTULA PLACEMENT Left 07/25/2015   Procedure: ARTERIOVENOUS (AV) FISTULA CREATION -LEFT BRACHIOCEPHALIC;  Surgeon: Conrad Malheur, MD;  Location: [place] [facility];  Service: Vascular;  Laterality: Left;  . COLONOSCOPY    . FISTULA SUPERFICIALIZATION Left 11/01/2015   Procedure: SUPERFICIALIZATION OF LEFT ARM BRACHIOCEPHALIC FISTULA ;  Surgeon: Conrad Lyman, MD;  Location: [facility];  Service: Vascular;  Laterality: Left;  . INSERTION OF DIALYSIS CATHETER Right 10/09/2015   Procedure: INSERTION OF  DIALYSIS CATHETER RIGHT INTERNAL JUGULAR ;  Surgeon: Angelia Mould, MD;  Location: [facility];  Service: Vascular;  Laterality: Right;    Current Medications: Outpatient Medications Prior to Visit  Medication Sig Dispense Refill  . aspirin 81 MG tablet Take 81 mg by mouth daily.    Marland Kitchen atorvastatin (LIPITOR) 40 MG tablet Take 40 mg by mouth daily.    . cinacalcet (SENSIPAR) 60 MG tablet Take 60 mg by mouth daily.    . febuxostat (ULORIC) 40 MG tablet Take 40 mg by mouth daily.    . multivitamin (RENA-VIT) TABS tablet Take 1 tablet by mouth daily. 30 each 0  . ONETOUCH VERIO test strip TEST 2 TIMES A DAY AND AS NEEDED FOR BLOOD SUGAR  0  . senna-docusate (SENOKOT-S) 8.6-50 MG tablet Take 2 tablets by mouth 2 (two) times daily. 120 tablet 0  . calcitRIOL (ROCALTROL) 0.5 MCG capsule Take 2 capsules (1 mcg total) by mouth Every Tuesday,Thursday,and Saturday with dialysis. 12 capsule 0  . metoprolol succinate (TOPROL-XL) 50 MG 24 hr tablet Take with or immediately following a meal. Take 1 tablet daily on non dialysis days only. 90 tablet 3  . polyethylene glycol (MIRALAX / GLYCOLAX) packet Take 17 g by mouth 2 (two) times daily. 14 each 0   No facility-administered medications prior to visit.      Allergies:   Morphine and related   Social History   Social History  . Marital status: Single    Spouse name: N/A  . Number of children: N/A  . Years of education: N/A   Social History Main Topics  . Smoking status: Never Smoker  . Smokeless tobacco: Never Used  . Alcohol use No  . Drug use: No  . Sexual activity: Not Asked   Other Topics Concern  . None   Social History Narrative  . None     Family History:  The patient's family history includes Cancer in her mother; Heart attack in her paternal aunt and sister; Hypertension in her father.   ROS:   Please see the history of present illness.    ROS All other systems reviewed and are negative.   PHYSICAL EXAM:   VS:  BP 97/62    Pulse 82   Ht 5\' 6"  (1.676 m)   Wt (!) 332 lb 9.6 oz (150.9 kg)   BMI 53.68 kg/m    GEN: Well nourished, well developed, in no acute distress  HEENT: normal  Neck: no JVD, carotid bruits, or masses Cardiac: RRR; no murmurs, rubs, or gallops,no edema  Respiratory:  clear to auscultation bilaterally, normal work of breathing GI: soft, nontender, nondistended, + BS MS: no deformity or atrophy  Skin: warm and dry, no rash Neuro:  Alert and Oriented x 3, Strength and sensation are intact Psych: euthymic mood, full affect  Wt Readings from Last 3 Encounters:  01/25/16 (!) 332 lb 9.6 oz (150.9 kg)  12/03/15 (!) 329 lb 1.9 oz (149.3 kg)  11/30/15 Marland Kitchen)  325 lb (147.4 kg)      Studies/Labs Reviewed:   EKG:  EKG is not ordered today.  Recent Labs: 10/04/2015: B Natriuretic Peptide 464.4 10/07/2015: TSH 1.541 10/12/2015: Magnesium 2.1 10/17/2015: ALT 28 11/06/2015: BUN 25; Creatinine, Ser 8.13; Hemoglobin 10.0; Platelets 245; Potassium 4.3; Sodium 135   Lipid Panel No results found for: CHOL, TRIG, HDL, CHOLHDL, VLDL, LDLCALC, LDLDIRECT  Additional studies/ records that were reviewed today include:   Echo 10/07/15 - Left ventricle: The cavity size was normal. Wall thickness was  increased in a pattern of severe LVH. Systolic function was  normal. The estimated ejection fraction was in the range of 55%  to 60%. - Aortic valve: There was very mild stenosis. - Left atrium: The atrium was moderately dilated. - Right ventricle: Catheter in RV - Atrial septum: No defect or patent foramen ovale was identified. - Pulmonary arteries: PA peak pressure: 37 mm Hg (S).   ASSESSMENT:    1. Chronic diastolic heart failure ([place])   2. Hemodialysis-associated hypotension   3. Sinus tachycardia ([place])   4. ESRD on dialysis [facility])      PLAN:  In order of problems listed above:  1. Chronic diastolic HF: Euvolemic on physical exam, volume managed by nephrology through hemodialysis.   2. Sinus  tach: Average heart rate 152 on 24-hour Holter monitor, started on Toprol-XL. Current heart rate in the 80s, blood pressure stable but borderline.  3. Elevated trop: She did have one elevated troponin at the hospital which was 11, however this was felt to be erroneous lab result. All other troponin was minimally elevated which is likely due to deviated ischemia. Denies any chest pain.  4. Hypotension: Likely related to dialysis. Continue to have hypotensive episodes during hemodialysis, she says her dialysis session and had to stop early several times. She also have dizziness when her blood pressure drops. I have advised her to hold her night dose of Toprol-XL prior to dialysis days.  5. ESRD: on HD TTS    Medication Adjustments/Labs and Tests Ordered: Current medicines are reviewed at length with the patient today.  Concerns regarding medicines are outlined above.  Medication changes, Labs and Tests ordered today are listed in the Patient Instructions below. Patient Instructions  Your physician has recommended you make the following change in your medication:   Due to decrease in blood pressure- DO NOT TAKE METOPROLOL ER SUCC THE NIGHT PRIOR TO DIALYSIS.  We will contact you to follow up in 3 months with Dr. Claiborne Billings or Dr Berline Lopes.    Hilbert Corrigan, [place]  01/25/2016 9:46 AM    [address], [place], [place]  [address] Phone: [phone]; Fax: [phone]

## 2016-02-02 DIAGNOSIS — E1122 Type 2 diabetes mellitus with diabetic chronic kidney disease: Secondary | ICD-10-CM | POA: Diagnosis not present

## 2016-02-02 DIAGNOSIS — D631 Anemia in chronic kidney disease: Secondary | ICD-10-CM | POA: Diagnosis not present

## 2016-02-02 DIAGNOSIS — N2581 Secondary hyperparathyroidism of renal origin: Secondary | ICD-10-CM | POA: Diagnosis not present

## 2016-02-02 DIAGNOSIS — D689 Coagulation defect, unspecified: Secondary | ICD-10-CM | POA: Diagnosis not present

## 2016-02-02 DIAGNOSIS — T8249XD Other complication of vascular dialysis catheter, subsequent encounter: Secondary | ICD-10-CM | POA: Diagnosis not present

## 2016-02-02 DIAGNOSIS — N186 End stage renal disease: Secondary | ICD-10-CM | POA: Diagnosis not present

## 2016-02-02 DIAGNOSIS — Z23 Encounter for immunization: Secondary | ICD-10-CM | POA: Diagnosis not present

## 2016-02-05 DIAGNOSIS — Z23 Encounter for immunization: Secondary | ICD-10-CM | POA: Diagnosis not present

## 2016-02-05 DIAGNOSIS — D631 Anemia in chronic kidney disease: Secondary | ICD-10-CM | POA: Diagnosis not present

## 2016-02-05 DIAGNOSIS — N186 End stage renal disease: Secondary | ICD-10-CM | POA: Diagnosis not present

## 2016-02-05 DIAGNOSIS — T8249XD Other complication of vascular dialysis catheter, subsequent encounter: Secondary | ICD-10-CM | POA: Diagnosis not present

## 2016-02-05 DIAGNOSIS — E1122 Type 2 diabetes mellitus with diabetic chronic kidney disease: Secondary | ICD-10-CM | POA: Diagnosis not present

## 2016-02-05 DIAGNOSIS — N2581 Secondary hyperparathyroidism of renal origin: Secondary | ICD-10-CM | POA: Diagnosis not present

## 2016-02-05 DIAGNOSIS — D689 Coagulation defect, unspecified: Secondary | ICD-10-CM | POA: Diagnosis not present

## 2016-02-06 DIAGNOSIS — E789 Disorder of lipoprotein metabolism, unspecified: Secondary | ICD-10-CM | POA: Diagnosis not present

## 2016-02-06 DIAGNOSIS — N189 Chronic kidney disease, unspecified: Secondary | ICD-10-CM | POA: Diagnosis not present

## 2016-02-06 DIAGNOSIS — E118 Type 2 diabetes mellitus with unspecified complications: Secondary | ICD-10-CM | POA: Diagnosis not present

## 2016-02-07 DIAGNOSIS — D631 Anemia in chronic kidney disease: Secondary | ICD-10-CM | POA: Diagnosis not present

## 2016-02-07 DIAGNOSIS — N186 End stage renal disease: Secondary | ICD-10-CM | POA: Diagnosis not present

## 2016-02-07 DIAGNOSIS — Z23 Encounter for immunization: Secondary | ICD-10-CM | POA: Diagnosis not present

## 2016-02-07 DIAGNOSIS — D689 Coagulation defect, unspecified: Secondary | ICD-10-CM | POA: Diagnosis not present

## 2016-02-07 DIAGNOSIS — T8249XD Other complication of vascular dialysis catheter, subsequent encounter: Secondary | ICD-10-CM | POA: Diagnosis not present

## 2016-02-07 DIAGNOSIS — N2581 Secondary hyperparathyroidism of renal origin: Secondary | ICD-10-CM | POA: Diagnosis not present

## 2016-02-07 DIAGNOSIS — E1122 Type 2 diabetes mellitus with diabetic chronic kidney disease: Secondary | ICD-10-CM | POA: Diagnosis not present

## 2016-02-09 DIAGNOSIS — N2581 Secondary hyperparathyroidism of renal origin: Secondary | ICD-10-CM | POA: Diagnosis not present

## 2016-02-09 DIAGNOSIS — E1122 Type 2 diabetes mellitus with diabetic chronic kidney disease: Secondary | ICD-10-CM | POA: Diagnosis not present

## 2016-02-09 DIAGNOSIS — T8249XD Other complication of vascular dialysis catheter, subsequent encounter: Secondary | ICD-10-CM | POA: Diagnosis not present

## 2016-02-09 DIAGNOSIS — N186 End stage renal disease: Secondary | ICD-10-CM | POA: Diagnosis not present

## 2016-02-09 DIAGNOSIS — D689 Coagulation defect, unspecified: Secondary | ICD-10-CM | POA: Diagnosis not present

## 2016-02-09 DIAGNOSIS — D631 Anemia in chronic kidney disease: Secondary | ICD-10-CM | POA: Diagnosis not present

## 2016-02-09 DIAGNOSIS — Z23 Encounter for immunization: Secondary | ICD-10-CM | POA: Diagnosis not present

## 2016-02-12 DIAGNOSIS — E1122 Type 2 diabetes mellitus with diabetic chronic kidney disease: Secondary | ICD-10-CM | POA: Diagnosis not present

## 2016-02-12 DIAGNOSIS — D689 Coagulation defect, unspecified: Secondary | ICD-10-CM | POA: Diagnosis not present

## 2016-02-12 DIAGNOSIS — T8249XD Other complication of vascular dialysis catheter, subsequent encounter: Secondary | ICD-10-CM | POA: Diagnosis not present

## 2016-02-12 DIAGNOSIS — Z23 Encounter for immunization: Secondary | ICD-10-CM | POA: Diagnosis not present

## 2016-02-12 DIAGNOSIS — D631 Anemia in chronic kidney disease: Secondary | ICD-10-CM | POA: Diagnosis not present

## 2016-02-12 DIAGNOSIS — N2581 Secondary hyperparathyroidism of renal origin: Secondary | ICD-10-CM | POA: Diagnosis not present

## 2016-02-12 DIAGNOSIS — N186 End stage renal disease: Secondary | ICD-10-CM | POA: Diagnosis not present

## 2016-02-14 DIAGNOSIS — N186 End stage renal disease: Secondary | ICD-10-CM | POA: Diagnosis not present

## 2016-02-14 DIAGNOSIS — T8249XD Other complication of vascular dialysis catheter, subsequent encounter: Secondary | ICD-10-CM | POA: Diagnosis not present

## 2016-02-14 DIAGNOSIS — N2581 Secondary hyperparathyroidism of renal origin: Secondary | ICD-10-CM | POA: Diagnosis not present

## 2016-02-14 DIAGNOSIS — E1122 Type 2 diabetes mellitus with diabetic chronic kidney disease: Secondary | ICD-10-CM | POA: Diagnosis not present

## 2016-02-14 DIAGNOSIS — Z23 Encounter for immunization: Secondary | ICD-10-CM | POA: Diagnosis not present

## 2016-02-14 DIAGNOSIS — D689 Coagulation defect, unspecified: Secondary | ICD-10-CM | POA: Diagnosis not present

## 2016-02-14 DIAGNOSIS — D631 Anemia in chronic kidney disease: Secondary | ICD-10-CM | POA: Diagnosis not present

## 2016-02-16 DIAGNOSIS — Z23 Encounter for immunization: Secondary | ICD-10-CM | POA: Diagnosis not present

## 2016-02-16 DIAGNOSIS — N186 End stage renal disease: Secondary | ICD-10-CM | POA: Diagnosis not present

## 2016-02-16 DIAGNOSIS — N2581 Secondary hyperparathyroidism of renal origin: Secondary | ICD-10-CM | POA: Diagnosis not present

## 2016-02-16 DIAGNOSIS — T8249XD Other complication of vascular dialysis catheter, subsequent encounter: Secondary | ICD-10-CM | POA: Diagnosis not present

## 2016-02-16 DIAGNOSIS — D631 Anemia in chronic kidney disease: Secondary | ICD-10-CM | POA: Diagnosis not present

## 2016-02-16 DIAGNOSIS — D689 Coagulation defect, unspecified: Secondary | ICD-10-CM | POA: Diagnosis not present

## 2016-02-16 DIAGNOSIS — E1122 Type 2 diabetes mellitus with diabetic chronic kidney disease: Secondary | ICD-10-CM | POA: Diagnosis not present

## 2016-02-19 DIAGNOSIS — Z23 Encounter for immunization: Secondary | ICD-10-CM | POA: Diagnosis not present

## 2016-02-19 DIAGNOSIS — N2581 Secondary hyperparathyroidism of renal origin: Secondary | ICD-10-CM | POA: Diagnosis not present

## 2016-02-19 DIAGNOSIS — D631 Anemia in chronic kidney disease: Secondary | ICD-10-CM | POA: Diagnosis not present

## 2016-02-19 DIAGNOSIS — D689 Coagulation defect, unspecified: Secondary | ICD-10-CM | POA: Diagnosis not present

## 2016-02-19 DIAGNOSIS — N186 End stage renal disease: Secondary | ICD-10-CM | POA: Diagnosis not present

## 2016-02-19 DIAGNOSIS — T8249XD Other complication of vascular dialysis catheter, subsequent encounter: Secondary | ICD-10-CM | POA: Diagnosis not present

## 2016-02-19 DIAGNOSIS — E1122 Type 2 diabetes mellitus with diabetic chronic kidney disease: Secondary | ICD-10-CM | POA: Diagnosis not present

## 2016-02-21 DIAGNOSIS — Z23 Encounter for immunization: Secondary | ICD-10-CM | POA: Diagnosis not present

## 2016-02-21 DIAGNOSIS — N2581 Secondary hyperparathyroidism of renal origin: Secondary | ICD-10-CM | POA: Diagnosis not present

## 2016-02-21 DIAGNOSIS — N186 End stage renal disease: Secondary | ICD-10-CM | POA: Diagnosis not present

## 2016-02-21 DIAGNOSIS — T8249XD Other complication of vascular dialysis catheter, subsequent encounter: Secondary | ICD-10-CM | POA: Diagnosis not present

## 2016-02-21 DIAGNOSIS — D689 Coagulation defect, unspecified: Secondary | ICD-10-CM | POA: Diagnosis not present

## 2016-02-21 DIAGNOSIS — E1122 Type 2 diabetes mellitus with diabetic chronic kidney disease: Secondary | ICD-10-CM | POA: Diagnosis not present

## 2016-02-21 DIAGNOSIS — D631 Anemia in chronic kidney disease: Secondary | ICD-10-CM | POA: Diagnosis not present

## 2016-02-23 DIAGNOSIS — T8249XD Other complication of vascular dialysis catheter, subsequent encounter: Secondary | ICD-10-CM | POA: Diagnosis not present

## 2016-02-23 DIAGNOSIS — Z23 Encounter for immunization: Secondary | ICD-10-CM | POA: Diagnosis not present

## 2016-02-23 DIAGNOSIS — D631 Anemia in chronic kidney disease: Secondary | ICD-10-CM | POA: Diagnosis not present

## 2016-02-23 DIAGNOSIS — D689 Coagulation defect, unspecified: Secondary | ICD-10-CM | POA: Diagnosis not present

## 2016-02-23 DIAGNOSIS — N186 End stage renal disease: Secondary | ICD-10-CM | POA: Diagnosis not present

## 2016-02-23 DIAGNOSIS — N2581 Secondary hyperparathyroidism of renal origin: Secondary | ICD-10-CM | POA: Diagnosis not present

## 2016-02-23 DIAGNOSIS — E1122 Type 2 diabetes mellitus with diabetic chronic kidney disease: Secondary | ICD-10-CM | POA: Diagnosis not present

## 2016-02-26 DIAGNOSIS — Z23 Encounter for immunization: Secondary | ICD-10-CM | POA: Diagnosis not present

## 2016-02-26 DIAGNOSIS — D689 Coagulation defect, unspecified: Secondary | ICD-10-CM | POA: Diagnosis not present

## 2016-02-26 DIAGNOSIS — T8249XD Other complication of vascular dialysis catheter, subsequent encounter: Secondary | ICD-10-CM | POA: Diagnosis not present

## 2016-02-26 DIAGNOSIS — D631 Anemia in chronic kidney disease: Secondary | ICD-10-CM | POA: Diagnosis not present

## 2016-02-26 DIAGNOSIS — N2581 Secondary hyperparathyroidism of renal origin: Secondary | ICD-10-CM | POA: Diagnosis not present

## 2016-02-26 DIAGNOSIS — E1122 Type 2 diabetes mellitus with diabetic chronic kidney disease: Secondary | ICD-10-CM | POA: Diagnosis not present

## 2016-02-26 DIAGNOSIS — N186 End stage renal disease: Secondary | ICD-10-CM | POA: Diagnosis not present

## 2016-02-27 DIAGNOSIS — R195 Other fecal abnormalities: Secondary | ICD-10-CM | POA: Diagnosis not present

## 2016-02-28 DIAGNOSIS — N2581 Secondary hyperparathyroidism of renal origin: Secondary | ICD-10-CM | POA: Diagnosis not present

## 2016-02-28 DIAGNOSIS — D689 Coagulation defect, unspecified: Secondary | ICD-10-CM | POA: Diagnosis not present

## 2016-02-28 DIAGNOSIS — N186 End stage renal disease: Secondary | ICD-10-CM | POA: Diagnosis not present

## 2016-02-28 DIAGNOSIS — D631 Anemia in chronic kidney disease: Secondary | ICD-10-CM | POA: Diagnosis not present

## 2016-02-28 DIAGNOSIS — Z23 Encounter for immunization: Secondary | ICD-10-CM | POA: Diagnosis not present

## 2016-02-28 DIAGNOSIS — T8249XD Other complication of vascular dialysis catheter, subsequent encounter: Secondary | ICD-10-CM | POA: Diagnosis not present

## 2016-02-28 DIAGNOSIS — E1122 Type 2 diabetes mellitus with diabetic chronic kidney disease: Secondary | ICD-10-CM | POA: Diagnosis not present

## 2016-03-01 DIAGNOSIS — N186 End stage renal disease: Secondary | ICD-10-CM | POA: Diagnosis not present

## 2016-03-01 DIAGNOSIS — Z992 Dependence on renal dialysis: Secondary | ICD-10-CM | POA: Diagnosis not present

## 2016-03-01 DIAGNOSIS — Z23 Encounter for immunization: Secondary | ICD-10-CM | POA: Diagnosis not present

## 2016-03-01 DIAGNOSIS — N2581 Secondary hyperparathyroidism of renal origin: Secondary | ICD-10-CM | POA: Diagnosis not present

## 2016-03-01 DIAGNOSIS — T8249XD Other complication of vascular dialysis catheter, subsequent encounter: Secondary | ICD-10-CM | POA: Diagnosis not present

## 2016-03-01 DIAGNOSIS — D689 Coagulation defect, unspecified: Secondary | ICD-10-CM | POA: Diagnosis not present

## 2016-03-01 DIAGNOSIS — D631 Anemia in chronic kidney disease: Secondary | ICD-10-CM | POA: Diagnosis not present

## 2016-03-01 DIAGNOSIS — E1122 Type 2 diabetes mellitus with diabetic chronic kidney disease: Secondary | ICD-10-CM | POA: Diagnosis not present

## 2016-03-01 DIAGNOSIS — I129 Hypertensive chronic kidney disease with stage 1 through stage 4 chronic kidney disease, or unspecified chronic kidney disease: Secondary | ICD-10-CM | POA: Diagnosis not present

## 2016-03-04 DIAGNOSIS — D689 Coagulation defect, unspecified: Secondary | ICD-10-CM | POA: Diagnosis not present

## 2016-03-04 DIAGNOSIS — N186 End stage renal disease: Secondary | ICD-10-CM | POA: Diagnosis not present

## 2016-03-04 DIAGNOSIS — N2581 Secondary hyperparathyroidism of renal origin: Secondary | ICD-10-CM | POA: Diagnosis not present

## 2016-03-04 DIAGNOSIS — E1122 Type 2 diabetes mellitus with diabetic chronic kidney disease: Secondary | ICD-10-CM | POA: Diagnosis not present

## 2016-03-04 DIAGNOSIS — T8249XD Other complication of vascular dialysis catheter, subsequent encounter: Secondary | ICD-10-CM | POA: Diagnosis not present

## 2016-03-06 DIAGNOSIS — N186 End stage renal disease: Secondary | ICD-10-CM | POA: Diagnosis not present

## 2016-03-06 DIAGNOSIS — N2581 Secondary hyperparathyroidism of renal origin: Secondary | ICD-10-CM | POA: Diagnosis not present

## 2016-03-06 DIAGNOSIS — E1122 Type 2 diabetes mellitus with diabetic chronic kidney disease: Secondary | ICD-10-CM | POA: Diagnosis not present

## 2016-03-06 DIAGNOSIS — D689 Coagulation defect, unspecified: Secondary | ICD-10-CM | POA: Diagnosis not present

## 2016-03-06 DIAGNOSIS — T8249XD Other complication of vascular dialysis catheter, subsequent encounter: Secondary | ICD-10-CM | POA: Diagnosis not present

## 2016-03-08 DIAGNOSIS — N2581 Secondary hyperparathyroidism of renal origin: Secondary | ICD-10-CM | POA: Diagnosis not present

## 2016-03-08 DIAGNOSIS — E1122 Type 2 diabetes mellitus with diabetic chronic kidney disease: Secondary | ICD-10-CM | POA: Diagnosis not present

## 2016-03-08 DIAGNOSIS — N186 End stage renal disease: Secondary | ICD-10-CM | POA: Diagnosis not present

## 2016-03-08 DIAGNOSIS — T8249XD Other complication of vascular dialysis catheter, subsequent encounter: Secondary | ICD-10-CM | POA: Diagnosis not present

## 2016-03-08 DIAGNOSIS — D689 Coagulation defect, unspecified: Secondary | ICD-10-CM | POA: Diagnosis not present

## 2016-03-11 DIAGNOSIS — D689 Coagulation defect, unspecified: Secondary | ICD-10-CM | POA: Diagnosis not present

## 2016-03-11 DIAGNOSIS — T8249XD Other complication of vascular dialysis catheter, subsequent encounter: Secondary | ICD-10-CM | POA: Diagnosis not present

## 2016-03-11 DIAGNOSIS — N186 End stage renal disease: Secondary | ICD-10-CM | POA: Diagnosis not present

## 2016-03-11 DIAGNOSIS — N2581 Secondary hyperparathyroidism of renal origin: Secondary | ICD-10-CM | POA: Diagnosis not present

## 2016-03-11 DIAGNOSIS — E1122 Type 2 diabetes mellitus with diabetic chronic kidney disease: Secondary | ICD-10-CM | POA: Diagnosis not present

## 2016-03-13 DIAGNOSIS — E1122 Type 2 diabetes mellitus with diabetic chronic kidney disease: Secondary | ICD-10-CM | POA: Diagnosis not present

## 2016-03-13 DIAGNOSIS — D689 Coagulation defect, unspecified: Secondary | ICD-10-CM | POA: Diagnosis not present

## 2016-03-13 DIAGNOSIS — N186 End stage renal disease: Secondary | ICD-10-CM | POA: Diagnosis not present

## 2016-03-13 DIAGNOSIS — N2581 Secondary hyperparathyroidism of renal origin: Secondary | ICD-10-CM | POA: Diagnosis not present

## 2016-03-13 DIAGNOSIS — T8249XD Other complication of vascular dialysis catheter, subsequent encounter: Secondary | ICD-10-CM | POA: Diagnosis not present

## 2016-03-15 DIAGNOSIS — N2581 Secondary hyperparathyroidism of renal origin: Secondary | ICD-10-CM | POA: Diagnosis not present

## 2016-03-15 DIAGNOSIS — E1122 Type 2 diabetes mellitus with diabetic chronic kidney disease: Secondary | ICD-10-CM | POA: Diagnosis not present

## 2016-03-15 DIAGNOSIS — D689 Coagulation defect, unspecified: Secondary | ICD-10-CM | POA: Diagnosis not present

## 2016-03-15 DIAGNOSIS — T8249XD Other complication of vascular dialysis catheter, subsequent encounter: Secondary | ICD-10-CM | POA: Diagnosis not present

## 2016-03-15 DIAGNOSIS — N186 End stage renal disease: Secondary | ICD-10-CM | POA: Diagnosis not present

## 2016-03-18 DIAGNOSIS — E1122 Type 2 diabetes mellitus with diabetic chronic kidney disease: Secondary | ICD-10-CM | POA: Diagnosis not present

## 2016-03-18 DIAGNOSIS — D689 Coagulation defect, unspecified: Secondary | ICD-10-CM | POA: Diagnosis not present

## 2016-03-18 DIAGNOSIS — T8249XD Other complication of vascular dialysis catheter, subsequent encounter: Secondary | ICD-10-CM | POA: Diagnosis not present

## 2016-03-18 DIAGNOSIS — N186 End stage renal disease: Secondary | ICD-10-CM | POA: Diagnosis not present

## 2016-03-18 DIAGNOSIS — N2581 Secondary hyperparathyroidism of renal origin: Secondary | ICD-10-CM | POA: Diagnosis not present

## 2016-03-20 DIAGNOSIS — N2581 Secondary hyperparathyroidism of renal origin: Secondary | ICD-10-CM | POA: Diagnosis not present

## 2016-03-20 DIAGNOSIS — D689 Coagulation defect, unspecified: Secondary | ICD-10-CM | POA: Diagnosis not present

## 2016-03-20 DIAGNOSIS — E1122 Type 2 diabetes mellitus with diabetic chronic kidney disease: Secondary | ICD-10-CM | POA: Diagnosis not present

## 2016-03-20 DIAGNOSIS — N186 End stage renal disease: Secondary | ICD-10-CM | POA: Diagnosis not present

## 2016-03-20 DIAGNOSIS — T8249XD Other complication of vascular dialysis catheter, subsequent encounter: Secondary | ICD-10-CM | POA: Diagnosis not present

## 2016-03-21 DIAGNOSIS — I871 Compression of vein: Secondary | ICD-10-CM | POA: Diagnosis not present

## 2016-03-21 DIAGNOSIS — T82858A Stenosis of vascular prosthetic devices, implants and grafts, initial encounter: Secondary | ICD-10-CM | POA: Diagnosis not present

## 2016-03-21 DIAGNOSIS — N186 End stage renal disease: Secondary | ICD-10-CM | POA: Diagnosis not present

## 2016-03-21 DIAGNOSIS — Z992 Dependence on renal dialysis: Secondary | ICD-10-CM | POA: Diagnosis not present

## 2016-03-22 DIAGNOSIS — N2581 Secondary hyperparathyroidism of renal origin: Secondary | ICD-10-CM | POA: Diagnosis not present

## 2016-03-22 DIAGNOSIS — T8249XD Other complication of vascular dialysis catheter, subsequent encounter: Secondary | ICD-10-CM | POA: Diagnosis not present

## 2016-03-22 DIAGNOSIS — E1122 Type 2 diabetes mellitus with diabetic chronic kidney disease: Secondary | ICD-10-CM | POA: Diagnosis not present

## 2016-03-22 DIAGNOSIS — D689 Coagulation defect, unspecified: Secondary | ICD-10-CM | POA: Diagnosis not present

## 2016-03-22 DIAGNOSIS — N186 End stage renal disease: Secondary | ICD-10-CM | POA: Diagnosis not present

## 2016-03-25 DIAGNOSIS — N2581 Secondary hyperparathyroidism of renal origin: Secondary | ICD-10-CM | POA: Diagnosis not present

## 2016-03-25 DIAGNOSIS — D689 Coagulation defect, unspecified: Secondary | ICD-10-CM | POA: Diagnosis not present

## 2016-03-25 DIAGNOSIS — E1122 Type 2 diabetes mellitus with diabetic chronic kidney disease: Secondary | ICD-10-CM | POA: Diagnosis not present

## 2016-03-25 DIAGNOSIS — T8249XD Other complication of vascular dialysis catheter, subsequent encounter: Secondary | ICD-10-CM | POA: Diagnosis not present

## 2016-03-25 DIAGNOSIS — N186 End stage renal disease: Secondary | ICD-10-CM | POA: Diagnosis not present

## 2016-03-27 DIAGNOSIS — E1122 Type 2 diabetes mellitus with diabetic chronic kidney disease: Secondary | ICD-10-CM | POA: Diagnosis not present

## 2016-03-27 DIAGNOSIS — N2581 Secondary hyperparathyroidism of renal origin: Secondary | ICD-10-CM | POA: Diagnosis not present

## 2016-03-27 DIAGNOSIS — N186 End stage renal disease: Secondary | ICD-10-CM | POA: Diagnosis not present

## 2016-03-27 DIAGNOSIS — T8249XD Other complication of vascular dialysis catheter, subsequent encounter: Secondary | ICD-10-CM | POA: Diagnosis not present

## 2016-03-27 DIAGNOSIS — D689 Coagulation defect, unspecified: Secondary | ICD-10-CM | POA: Diagnosis not present

## 2016-03-29 DIAGNOSIS — T8249XD Other complication of vascular dialysis catheter, subsequent encounter: Secondary | ICD-10-CM | POA: Diagnosis not present

## 2016-03-29 DIAGNOSIS — E1122 Type 2 diabetes mellitus with diabetic chronic kidney disease: Secondary | ICD-10-CM | POA: Diagnosis not present

## 2016-03-29 DIAGNOSIS — D689 Coagulation defect, unspecified: Secondary | ICD-10-CM | POA: Diagnosis not present

## 2016-03-29 DIAGNOSIS — N2581 Secondary hyperparathyroidism of renal origin: Secondary | ICD-10-CM | POA: Diagnosis not present

## 2016-03-29 DIAGNOSIS — N186 End stage renal disease: Secondary | ICD-10-CM | POA: Diagnosis not present

## 2016-04-01 DIAGNOSIS — I129 Hypertensive chronic kidney disease with stage 1 through stage 4 chronic kidney disease, or unspecified chronic kidney disease: Secondary | ICD-10-CM | POA: Diagnosis not present

## 2016-04-01 DIAGNOSIS — N186 End stage renal disease: Secondary | ICD-10-CM | POA: Diagnosis not present

## 2016-04-01 DIAGNOSIS — E1122 Type 2 diabetes mellitus with diabetic chronic kidney disease: Secondary | ICD-10-CM | POA: Diagnosis not present

## 2016-04-01 DIAGNOSIS — N2581 Secondary hyperparathyroidism of renal origin: Secondary | ICD-10-CM | POA: Diagnosis not present

## 2016-04-01 DIAGNOSIS — Z992 Dependence on renal dialysis: Secondary | ICD-10-CM | POA: Diagnosis not present

## 2016-04-01 DIAGNOSIS — T8249XD Other complication of vascular dialysis catheter, subsequent encounter: Secondary | ICD-10-CM | POA: Diagnosis not present

## 2016-04-01 DIAGNOSIS — D689 Coagulation defect, unspecified: Secondary | ICD-10-CM | POA: Diagnosis not present

## 2016-04-03 DIAGNOSIS — N186 End stage renal disease: Secondary | ICD-10-CM | POA: Diagnosis not present

## 2016-04-03 DIAGNOSIS — T8249XD Other complication of vascular dialysis catheter, subsequent encounter: Secondary | ICD-10-CM | POA: Diagnosis not present

## 2016-04-03 DIAGNOSIS — D631 Anemia in chronic kidney disease: Secondary | ICD-10-CM | POA: Diagnosis not present

## 2016-04-03 DIAGNOSIS — D509 Iron deficiency anemia, unspecified: Secondary | ICD-10-CM | POA: Diagnosis not present

## 2016-04-03 DIAGNOSIS — D689 Coagulation defect, unspecified: Secondary | ICD-10-CM | POA: Diagnosis not present

## 2016-04-03 DIAGNOSIS — N2581 Secondary hyperparathyroidism of renal origin: Secondary | ICD-10-CM | POA: Diagnosis not present

## 2016-04-05 DIAGNOSIS — D689 Coagulation defect, unspecified: Secondary | ICD-10-CM | POA: Diagnosis not present

## 2016-04-05 DIAGNOSIS — D509 Iron deficiency anemia, unspecified: Secondary | ICD-10-CM | POA: Diagnosis not present

## 2016-04-05 DIAGNOSIS — T8249XD Other complication of vascular dialysis catheter, subsequent encounter: Secondary | ICD-10-CM | POA: Diagnosis not present

## 2016-04-05 DIAGNOSIS — N2581 Secondary hyperparathyroidism of renal origin: Secondary | ICD-10-CM | POA: Diagnosis not present

## 2016-04-05 DIAGNOSIS — D631 Anemia in chronic kidney disease: Secondary | ICD-10-CM | POA: Diagnosis not present

## 2016-04-05 DIAGNOSIS — N186 End stage renal disease: Secondary | ICD-10-CM | POA: Diagnosis not present

## 2016-04-08 ENCOUNTER — Encounter: Payer: Self-pay | Admitting: Vascular Surgery

## 2016-04-08 DIAGNOSIS — D689 Coagulation defect, unspecified: Secondary | ICD-10-CM | POA: Diagnosis not present

## 2016-04-08 DIAGNOSIS — D631 Anemia in chronic kidney disease: Secondary | ICD-10-CM | POA: Diagnosis not present

## 2016-04-08 DIAGNOSIS — T8249XD Other complication of vascular dialysis catheter, subsequent encounter: Secondary | ICD-10-CM | POA: Diagnosis not present

## 2016-04-08 DIAGNOSIS — N2581 Secondary hyperparathyroidism of renal origin: Secondary | ICD-10-CM | POA: Diagnosis not present

## 2016-04-08 DIAGNOSIS — D509 Iron deficiency anemia, unspecified: Secondary | ICD-10-CM | POA: Diagnosis not present

## 2016-04-08 DIAGNOSIS — N186 End stage renal disease: Secondary | ICD-10-CM | POA: Diagnosis not present

## 2016-04-09 ENCOUNTER — Encounter: Payer: Self-pay | Admitting: Vascular Surgery

## 2016-04-09 ENCOUNTER — Ambulatory Visit (INDEPENDENT_AMBULATORY_CARE_PROVIDER_SITE_OTHER): Payer: Medicare Other | Admitting: Vascular Surgery

## 2016-04-09 VITALS — BP 121/82 | HR 108 | Temp 97.3°F | Resp 18 | Ht 66.0 in | Wt 337.0 lb

## 2016-04-09 DIAGNOSIS — Z992 Dependence on renal dialysis: Secondary | ICD-10-CM

## 2016-04-09 DIAGNOSIS — N186 End stage renal disease: Secondary | ICD-10-CM | POA: Diagnosis not present

## 2016-04-09 NOTE — Progress Notes (Signed)
Patient is a 60 year old female sent for evaluation of a poorly healing wound of her left arm AV fistula. She had revision of the fistula in June 2017 with Superficialization. It is a left brachiocephalic AV fistula. The incision has failed to completely heal. She denies any bleeding episodes. She is currently treating it with Neosporin. They are able to successfully use the access for dialysis.  Review of systems: She has shortness of breath with exertion. She uses CPAP at home and occasionally has a productive cough.  Physical exam:  Vitals:   04/09/16 1334  BP: 121/82  Pulse: (!) 108  Resp: 18  Temp: 97.3 F (36.3 C)  TempSrc: Oral  SpO2: 95%  Weight: (!) 337 lb (152.9 kg)  Height: 5\' 6"  (1.676 m)     Left upper extremity: There is a curvilinear arcing scar over the biceps muscle the upper half of this is completely healed. The lower third has slight separation of 1 mm with some granulation tissue. The area of separation of the scan is remote from the fistula which is more lateral. There is an audible bruit and palpable thrill in the fistula.  Assessment: Slowly healing wound left upper extremity appears to be remote from the fistula so should be low risk of bleeding overall. New. Plan: The patient will continue applying Neosporin ointment once daily. She will follow-up with me in 2-4 weeks to make sure that the wound completely heals.  Plan: See above  Ruta Hinds, MD [facility] Office: [phone] Pager: [phone]

## 2016-04-10 DIAGNOSIS — N186 End stage renal disease: Secondary | ICD-10-CM | POA: Diagnosis not present

## 2016-04-10 DIAGNOSIS — T8249XD Other complication of vascular dialysis catheter, subsequent encounter: Secondary | ICD-10-CM | POA: Diagnosis not present

## 2016-04-10 DIAGNOSIS — D689 Coagulation defect, unspecified: Secondary | ICD-10-CM | POA: Diagnosis not present

## 2016-04-10 DIAGNOSIS — D631 Anemia in chronic kidney disease: Secondary | ICD-10-CM | POA: Diagnosis not present

## 2016-04-10 DIAGNOSIS — D509 Iron deficiency anemia, unspecified: Secondary | ICD-10-CM | POA: Diagnosis not present

## 2016-04-10 DIAGNOSIS — N2581 Secondary hyperparathyroidism of renal origin: Secondary | ICD-10-CM | POA: Diagnosis not present

## 2016-04-11 DIAGNOSIS — N186 End stage renal disease: Secondary | ICD-10-CM | POA: Diagnosis not present

## 2016-04-11 DIAGNOSIS — D689 Coagulation defect, unspecified: Secondary | ICD-10-CM | POA: Diagnosis not present

## 2016-04-11 DIAGNOSIS — D509 Iron deficiency anemia, unspecified: Secondary | ICD-10-CM | POA: Diagnosis not present

## 2016-04-11 DIAGNOSIS — N2581 Secondary hyperparathyroidism of renal origin: Secondary | ICD-10-CM | POA: Diagnosis not present

## 2016-04-11 DIAGNOSIS — D631 Anemia in chronic kidney disease: Secondary | ICD-10-CM | POA: Diagnosis not present

## 2016-04-11 DIAGNOSIS — T8249XD Other complication of vascular dialysis catheter, subsequent encounter: Secondary | ICD-10-CM | POA: Diagnosis not present

## 2016-04-14 DIAGNOSIS — D631 Anemia in chronic kidney disease: Secondary | ICD-10-CM | POA: Diagnosis not present

## 2016-04-14 DIAGNOSIS — D509 Iron deficiency anemia, unspecified: Secondary | ICD-10-CM | POA: Diagnosis not present

## 2016-04-14 DIAGNOSIS — T8249XD Other complication of vascular dialysis catheter, subsequent encounter: Secondary | ICD-10-CM | POA: Diagnosis not present

## 2016-04-14 DIAGNOSIS — N186 End stage renal disease: Secondary | ICD-10-CM | POA: Diagnosis not present

## 2016-04-14 DIAGNOSIS — D689 Coagulation defect, unspecified: Secondary | ICD-10-CM | POA: Diagnosis not present

## 2016-04-14 DIAGNOSIS — N2581 Secondary hyperparathyroidism of renal origin: Secondary | ICD-10-CM | POA: Diagnosis not present

## 2016-04-16 DIAGNOSIS — Z452 Encounter for adjustment and management of vascular access device: Secondary | ICD-10-CM | POA: Diagnosis not present

## 2016-04-17 DIAGNOSIS — N186 End stage renal disease: Secondary | ICD-10-CM | POA: Diagnosis not present

## 2016-04-17 DIAGNOSIS — D631 Anemia in chronic kidney disease: Secondary | ICD-10-CM | POA: Diagnosis not present

## 2016-04-17 DIAGNOSIS — D509 Iron deficiency anemia, unspecified: Secondary | ICD-10-CM | POA: Diagnosis not present

## 2016-04-17 DIAGNOSIS — T8249XD Other complication of vascular dialysis catheter, subsequent encounter: Secondary | ICD-10-CM | POA: Diagnosis not present

## 2016-04-17 DIAGNOSIS — N2581 Secondary hyperparathyroidism of renal origin: Secondary | ICD-10-CM | POA: Diagnosis not present

## 2016-04-17 DIAGNOSIS — D689 Coagulation defect, unspecified: Secondary | ICD-10-CM | POA: Diagnosis not present

## 2016-04-19 DIAGNOSIS — D509 Iron deficiency anemia, unspecified: Secondary | ICD-10-CM | POA: Diagnosis not present

## 2016-04-19 DIAGNOSIS — D631 Anemia in chronic kidney disease: Secondary | ICD-10-CM | POA: Diagnosis not present

## 2016-04-19 DIAGNOSIS — D689 Coagulation defect, unspecified: Secondary | ICD-10-CM | POA: Diagnosis not present

## 2016-04-19 DIAGNOSIS — N2581 Secondary hyperparathyroidism of renal origin: Secondary | ICD-10-CM | POA: Diagnosis not present

## 2016-04-19 DIAGNOSIS — N186 End stage renal disease: Secondary | ICD-10-CM | POA: Diagnosis not present

## 2016-04-19 DIAGNOSIS — T8249XD Other complication of vascular dialysis catheter, subsequent encounter: Secondary | ICD-10-CM | POA: Diagnosis not present

## 2016-04-21 DIAGNOSIS — D631 Anemia in chronic kidney disease: Secondary | ICD-10-CM | POA: Diagnosis not present

## 2016-04-21 DIAGNOSIS — N186 End stage renal disease: Secondary | ICD-10-CM | POA: Diagnosis not present

## 2016-04-21 DIAGNOSIS — D509 Iron deficiency anemia, unspecified: Secondary | ICD-10-CM | POA: Diagnosis not present

## 2016-04-21 DIAGNOSIS — N2581 Secondary hyperparathyroidism of renal origin: Secondary | ICD-10-CM | POA: Diagnosis not present

## 2016-04-21 DIAGNOSIS — T8249XD Other complication of vascular dialysis catheter, subsequent encounter: Secondary | ICD-10-CM | POA: Diagnosis not present

## 2016-04-21 DIAGNOSIS — D689 Coagulation defect, unspecified: Secondary | ICD-10-CM | POA: Diagnosis not present

## 2016-04-23 DIAGNOSIS — N186 End stage renal disease: Secondary | ICD-10-CM | POA: Diagnosis not present

## 2016-04-23 DIAGNOSIS — T8249XD Other complication of vascular dialysis catheter, subsequent encounter: Secondary | ICD-10-CM | POA: Diagnosis not present

## 2016-04-23 DIAGNOSIS — D631 Anemia in chronic kidney disease: Secondary | ICD-10-CM | POA: Diagnosis not present

## 2016-04-23 DIAGNOSIS — D509 Iron deficiency anemia, unspecified: Secondary | ICD-10-CM | POA: Diagnosis not present

## 2016-04-23 DIAGNOSIS — D689 Coagulation defect, unspecified: Secondary | ICD-10-CM | POA: Diagnosis not present

## 2016-04-23 DIAGNOSIS — N2581 Secondary hyperparathyroidism of renal origin: Secondary | ICD-10-CM | POA: Diagnosis not present

## 2016-04-26 DIAGNOSIS — N2581 Secondary hyperparathyroidism of renal origin: Secondary | ICD-10-CM | POA: Diagnosis not present

## 2016-04-26 DIAGNOSIS — T8249XD Other complication of vascular dialysis catheter, subsequent encounter: Secondary | ICD-10-CM | POA: Diagnosis not present

## 2016-04-26 DIAGNOSIS — D509 Iron deficiency anemia, unspecified: Secondary | ICD-10-CM | POA: Diagnosis not present

## 2016-04-26 DIAGNOSIS — D689 Coagulation defect, unspecified: Secondary | ICD-10-CM | POA: Diagnosis not present

## 2016-04-26 DIAGNOSIS — N186 End stage renal disease: Secondary | ICD-10-CM | POA: Diagnosis not present

## 2016-04-26 DIAGNOSIS — D631 Anemia in chronic kidney disease: Secondary | ICD-10-CM | POA: Diagnosis not present

## 2016-04-28 ENCOUNTER — Telehealth: Payer: Self-pay | Admitting: Interventional Cardiology

## 2016-04-28 NOTE — Telephone Encounter (Signed)
ok 

## 2016-04-28 NOTE — Telephone Encounter (Signed)
New Message  Pt voiced she is wanting to be released by MD-Varanasi and to be accepted as a new pt by MD-Kelly.  Please f/u

## 2016-04-28 NOTE — Telephone Encounter (Signed)
I am fine with her changing to Dr. Claiborne Billings.

## 2016-04-29 DIAGNOSIS — T8249XD Other complication of vascular dialysis catheter, subsequent encounter: Secondary | ICD-10-CM | POA: Diagnosis not present

## 2016-04-29 DIAGNOSIS — D509 Iron deficiency anemia, unspecified: Secondary | ICD-10-CM | POA: Diagnosis not present

## 2016-04-29 DIAGNOSIS — D631 Anemia in chronic kidney disease: Secondary | ICD-10-CM | POA: Diagnosis not present

## 2016-04-29 DIAGNOSIS — D689 Coagulation defect, unspecified: Secondary | ICD-10-CM | POA: Diagnosis not present

## 2016-04-29 DIAGNOSIS — N2581 Secondary hyperparathyroidism of renal origin: Secondary | ICD-10-CM | POA: Diagnosis not present

## 2016-04-29 DIAGNOSIS — N186 End stage renal disease: Secondary | ICD-10-CM | POA: Diagnosis not present

## 2016-05-01 DIAGNOSIS — D631 Anemia in chronic kidney disease: Secondary | ICD-10-CM | POA: Diagnosis not present

## 2016-05-01 DIAGNOSIS — I129 Hypertensive chronic kidney disease with stage 1 through stage 4 chronic kidney disease, or unspecified chronic kidney disease: Secondary | ICD-10-CM | POA: Diagnosis not present

## 2016-05-01 DIAGNOSIS — D509 Iron deficiency anemia, unspecified: Secondary | ICD-10-CM | POA: Diagnosis not present

## 2016-05-01 DIAGNOSIS — Z992 Dependence on renal dialysis: Secondary | ICD-10-CM | POA: Diagnosis not present

## 2016-05-01 DIAGNOSIS — N2581 Secondary hyperparathyroidism of renal origin: Secondary | ICD-10-CM | POA: Diagnosis not present

## 2016-05-01 DIAGNOSIS — D689 Coagulation defect, unspecified: Secondary | ICD-10-CM | POA: Diagnosis not present

## 2016-05-01 DIAGNOSIS — N186 End stage renal disease: Secondary | ICD-10-CM | POA: Diagnosis not present

## 2016-05-01 DIAGNOSIS — T8249XD Other complication of vascular dialysis catheter, subsequent encounter: Secondary | ICD-10-CM | POA: Diagnosis not present

## 2016-05-03 DIAGNOSIS — N186 End stage renal disease: Secondary | ICD-10-CM | POA: Diagnosis not present

## 2016-05-03 DIAGNOSIS — D689 Coagulation defect, unspecified: Secondary | ICD-10-CM | POA: Diagnosis not present

## 2016-05-03 DIAGNOSIS — E1122 Type 2 diabetes mellitus with diabetic chronic kidney disease: Secondary | ICD-10-CM | POA: Diagnosis not present

## 2016-05-03 DIAGNOSIS — D631 Anemia in chronic kidney disease: Secondary | ICD-10-CM | POA: Diagnosis not present

## 2016-05-03 DIAGNOSIS — N2581 Secondary hyperparathyroidism of renal origin: Secondary | ICD-10-CM | POA: Diagnosis not present

## 2016-05-06 ENCOUNTER — Ambulatory Visit: Payer: Self-pay | Admitting: Cardiovascular Disease

## 2016-05-06 DIAGNOSIS — N186 End stage renal disease: Secondary | ICD-10-CM | POA: Diagnosis not present

## 2016-05-06 DIAGNOSIS — E1122 Type 2 diabetes mellitus with diabetic chronic kidney disease: Secondary | ICD-10-CM | POA: Diagnosis not present

## 2016-05-06 DIAGNOSIS — D631 Anemia in chronic kidney disease: Secondary | ICD-10-CM | POA: Diagnosis not present

## 2016-05-06 DIAGNOSIS — N2581 Secondary hyperparathyroidism of renal origin: Secondary | ICD-10-CM | POA: Diagnosis not present

## 2016-05-06 DIAGNOSIS — D689 Coagulation defect, unspecified: Secondary | ICD-10-CM | POA: Diagnosis not present

## 2016-05-07 ENCOUNTER — Ambulatory Visit: Payer: Self-pay | Admitting: Cardiovascular Disease

## 2016-05-08 DIAGNOSIS — N2581 Secondary hyperparathyroidism of renal origin: Secondary | ICD-10-CM | POA: Diagnosis not present

## 2016-05-08 DIAGNOSIS — N186 End stage renal disease: Secondary | ICD-10-CM | POA: Diagnosis not present

## 2016-05-08 DIAGNOSIS — D631 Anemia in chronic kidney disease: Secondary | ICD-10-CM | POA: Diagnosis not present

## 2016-05-08 DIAGNOSIS — D689 Coagulation defect, unspecified: Secondary | ICD-10-CM | POA: Diagnosis not present

## 2016-05-08 DIAGNOSIS — E1122 Type 2 diabetes mellitus with diabetic chronic kidney disease: Secondary | ICD-10-CM | POA: Diagnosis not present

## 2016-05-10 DIAGNOSIS — E1122 Type 2 diabetes mellitus with diabetic chronic kidney disease: Secondary | ICD-10-CM | POA: Diagnosis not present

## 2016-05-10 DIAGNOSIS — D631 Anemia in chronic kidney disease: Secondary | ICD-10-CM | POA: Diagnosis not present

## 2016-05-10 DIAGNOSIS — D689 Coagulation defect, unspecified: Secondary | ICD-10-CM | POA: Diagnosis not present

## 2016-05-10 DIAGNOSIS — N186 End stage renal disease: Secondary | ICD-10-CM | POA: Diagnosis not present

## 2016-05-10 DIAGNOSIS — N2581 Secondary hyperparathyroidism of renal origin: Secondary | ICD-10-CM | POA: Diagnosis not present

## 2016-05-13 DIAGNOSIS — D631 Anemia in chronic kidney disease: Secondary | ICD-10-CM | POA: Diagnosis not present

## 2016-05-13 DIAGNOSIS — N186 End stage renal disease: Secondary | ICD-10-CM | POA: Diagnosis not present

## 2016-05-13 DIAGNOSIS — N2581 Secondary hyperparathyroidism of renal origin: Secondary | ICD-10-CM | POA: Diagnosis not present

## 2016-05-13 DIAGNOSIS — E1122 Type 2 diabetes mellitus with diabetic chronic kidney disease: Secondary | ICD-10-CM | POA: Diagnosis not present

## 2016-05-13 DIAGNOSIS — D689 Coagulation defect, unspecified: Secondary | ICD-10-CM | POA: Diagnosis not present

## 2016-05-14 DIAGNOSIS — I10 Essential (primary) hypertension: Secondary | ICD-10-CM | POA: Diagnosis not present

## 2016-05-14 DIAGNOSIS — E118 Type 2 diabetes mellitus with unspecified complications: Secondary | ICD-10-CM | POA: Diagnosis not present

## 2016-05-15 ENCOUNTER — Encounter: Payer: Self-pay | Admitting: Vascular Surgery

## 2016-05-15 DIAGNOSIS — N2581 Secondary hyperparathyroidism of renal origin: Secondary | ICD-10-CM | POA: Diagnosis not present

## 2016-05-15 DIAGNOSIS — E1122 Type 2 diabetes mellitus with diabetic chronic kidney disease: Secondary | ICD-10-CM | POA: Diagnosis not present

## 2016-05-15 DIAGNOSIS — D631 Anemia in chronic kidney disease: Secondary | ICD-10-CM | POA: Diagnosis not present

## 2016-05-15 DIAGNOSIS — D689 Coagulation defect, unspecified: Secondary | ICD-10-CM | POA: Diagnosis not present

## 2016-05-15 DIAGNOSIS — N186 End stage renal disease: Secondary | ICD-10-CM | POA: Diagnosis not present

## 2016-05-17 DIAGNOSIS — D689 Coagulation defect, unspecified: Secondary | ICD-10-CM | POA: Diagnosis not present

## 2016-05-17 DIAGNOSIS — N2581 Secondary hyperparathyroidism of renal origin: Secondary | ICD-10-CM | POA: Diagnosis not present

## 2016-05-17 DIAGNOSIS — E1122 Type 2 diabetes mellitus with diabetic chronic kidney disease: Secondary | ICD-10-CM | POA: Diagnosis not present

## 2016-05-17 DIAGNOSIS — N186 End stage renal disease: Secondary | ICD-10-CM | POA: Diagnosis not present

## 2016-05-17 DIAGNOSIS — D631 Anemia in chronic kidney disease: Secondary | ICD-10-CM | POA: Diagnosis not present

## 2016-05-19 ENCOUNTER — Ambulatory Visit: Payer: Self-pay | Admitting: Interventional Cardiology

## 2016-05-20 DIAGNOSIS — D631 Anemia in chronic kidney disease: Secondary | ICD-10-CM | POA: Diagnosis not present

## 2016-05-20 DIAGNOSIS — E1122 Type 2 diabetes mellitus with diabetic chronic kidney disease: Secondary | ICD-10-CM | POA: Diagnosis not present

## 2016-05-20 DIAGNOSIS — D689 Coagulation defect, unspecified: Secondary | ICD-10-CM | POA: Diagnosis not present

## 2016-05-20 DIAGNOSIS — N2581 Secondary hyperparathyroidism of renal origin: Secondary | ICD-10-CM | POA: Diagnosis not present

## 2016-05-20 DIAGNOSIS — N186 End stage renal disease: Secondary | ICD-10-CM | POA: Diagnosis not present

## 2016-05-21 ENCOUNTER — Ambulatory Visit: Payer: Medicare Other | Admitting: Vascular Surgery

## 2016-05-21 DIAGNOSIS — R05 Cough: Secondary | ICD-10-CM | POA: Diagnosis not present

## 2016-05-21 DIAGNOSIS — E789 Disorder of lipoprotein metabolism, unspecified: Secondary | ICD-10-CM | POA: Diagnosis not present

## 2016-05-21 DIAGNOSIS — E118 Type 2 diabetes mellitus with unspecified complications: Secondary | ICD-10-CM | POA: Diagnosis not present

## 2016-05-21 DIAGNOSIS — I10 Essential (primary) hypertension: Secondary | ICD-10-CM | POA: Diagnosis not present

## 2016-05-22 DIAGNOSIS — D631 Anemia in chronic kidney disease: Secondary | ICD-10-CM | POA: Diagnosis not present

## 2016-05-22 DIAGNOSIS — E1122 Type 2 diabetes mellitus with diabetic chronic kidney disease: Secondary | ICD-10-CM | POA: Diagnosis not present

## 2016-05-22 DIAGNOSIS — N186 End stage renal disease: Secondary | ICD-10-CM | POA: Diagnosis not present

## 2016-05-22 DIAGNOSIS — D689 Coagulation defect, unspecified: Secondary | ICD-10-CM | POA: Diagnosis not present

## 2016-05-22 DIAGNOSIS — N2581 Secondary hyperparathyroidism of renal origin: Secondary | ICD-10-CM | POA: Diagnosis not present

## 2016-05-24 DIAGNOSIS — N186 End stage renal disease: Secondary | ICD-10-CM | POA: Diagnosis not present

## 2016-05-24 DIAGNOSIS — D689 Coagulation defect, unspecified: Secondary | ICD-10-CM | POA: Diagnosis not present

## 2016-05-24 DIAGNOSIS — N2581 Secondary hyperparathyroidism of renal origin: Secondary | ICD-10-CM | POA: Diagnosis not present

## 2016-05-24 DIAGNOSIS — D631 Anemia in chronic kidney disease: Secondary | ICD-10-CM | POA: Diagnosis not present

## 2016-05-24 DIAGNOSIS — E1122 Type 2 diabetes mellitus with diabetic chronic kidney disease: Secondary | ICD-10-CM | POA: Diagnosis not present

## 2016-05-27 DIAGNOSIS — N2581 Secondary hyperparathyroidism of renal origin: Secondary | ICD-10-CM | POA: Diagnosis not present

## 2016-05-27 DIAGNOSIS — D631 Anemia in chronic kidney disease: Secondary | ICD-10-CM | POA: Diagnosis not present

## 2016-05-27 DIAGNOSIS — E1122 Type 2 diabetes mellitus with diabetic chronic kidney disease: Secondary | ICD-10-CM | POA: Diagnosis not present

## 2016-05-27 DIAGNOSIS — D689 Coagulation defect, unspecified: Secondary | ICD-10-CM | POA: Diagnosis not present

## 2016-05-27 DIAGNOSIS — N186 End stage renal disease: Secondary | ICD-10-CM | POA: Diagnosis not present

## 2016-05-29 DIAGNOSIS — E1122 Type 2 diabetes mellitus with diabetic chronic kidney disease: Secondary | ICD-10-CM | POA: Diagnosis not present

## 2016-05-29 DIAGNOSIS — N186 End stage renal disease: Secondary | ICD-10-CM | POA: Diagnosis not present

## 2016-05-29 DIAGNOSIS — D631 Anemia in chronic kidney disease: Secondary | ICD-10-CM | POA: Diagnosis not present

## 2016-05-29 DIAGNOSIS — N2581 Secondary hyperparathyroidism of renal origin: Secondary | ICD-10-CM | POA: Diagnosis not present

## 2016-05-29 DIAGNOSIS — D689 Coagulation defect, unspecified: Secondary | ICD-10-CM | POA: Diagnosis not present

## 2016-05-31 DIAGNOSIS — D689 Coagulation defect, unspecified: Secondary | ICD-10-CM | POA: Diagnosis not present

## 2016-05-31 DIAGNOSIS — N186 End stage renal disease: Secondary | ICD-10-CM | POA: Diagnosis not present

## 2016-05-31 DIAGNOSIS — N2581 Secondary hyperparathyroidism of renal origin: Secondary | ICD-10-CM | POA: Diagnosis not present

## 2016-05-31 DIAGNOSIS — D631 Anemia in chronic kidney disease: Secondary | ICD-10-CM | POA: Diagnosis not present

## 2016-05-31 DIAGNOSIS — E1122 Type 2 diabetes mellitus with diabetic chronic kidney disease: Secondary | ICD-10-CM | POA: Diagnosis not present

## 2016-06-01 DIAGNOSIS — Z992 Dependence on renal dialysis: Secondary | ICD-10-CM | POA: Diagnosis not present

## 2016-06-01 DIAGNOSIS — N186 End stage renal disease: Secondary | ICD-10-CM | POA: Diagnosis not present

## 2016-06-01 DIAGNOSIS — I129 Hypertensive chronic kidney disease with stage 1 through stage 4 chronic kidney disease, or unspecified chronic kidney disease: Secondary | ICD-10-CM | POA: Diagnosis not present

## 2016-06-03 DIAGNOSIS — N186 End stage renal disease: Secondary | ICD-10-CM | POA: Diagnosis not present

## 2016-06-03 DIAGNOSIS — D689 Coagulation defect, unspecified: Secondary | ICD-10-CM | POA: Diagnosis not present

## 2016-06-03 DIAGNOSIS — D631 Anemia in chronic kidney disease: Secondary | ICD-10-CM | POA: Diagnosis not present

## 2016-06-03 DIAGNOSIS — D509 Iron deficiency anemia, unspecified: Secondary | ICD-10-CM | POA: Diagnosis not present

## 2016-06-03 DIAGNOSIS — Z23 Encounter for immunization: Secondary | ICD-10-CM | POA: Diagnosis not present

## 2016-06-03 DIAGNOSIS — E1122 Type 2 diabetes mellitus with diabetic chronic kidney disease: Secondary | ICD-10-CM | POA: Diagnosis not present

## 2016-06-03 DIAGNOSIS — N2581 Secondary hyperparathyroidism of renal origin: Secondary | ICD-10-CM | POA: Diagnosis not present

## 2016-06-05 DIAGNOSIS — N2581 Secondary hyperparathyroidism of renal origin: Secondary | ICD-10-CM | POA: Diagnosis not present

## 2016-06-05 DIAGNOSIS — D689 Coagulation defect, unspecified: Secondary | ICD-10-CM | POA: Diagnosis not present

## 2016-06-05 DIAGNOSIS — D509 Iron deficiency anemia, unspecified: Secondary | ICD-10-CM | POA: Diagnosis not present

## 2016-06-05 DIAGNOSIS — D631 Anemia in chronic kidney disease: Secondary | ICD-10-CM | POA: Diagnosis not present

## 2016-06-05 DIAGNOSIS — N186 End stage renal disease: Secondary | ICD-10-CM | POA: Diagnosis not present

## 2016-06-05 DIAGNOSIS — Z23 Encounter for immunization: Secondary | ICD-10-CM | POA: Diagnosis not present

## 2016-06-05 DIAGNOSIS — E1122 Type 2 diabetes mellitus with diabetic chronic kidney disease: Secondary | ICD-10-CM | POA: Diagnosis not present

## 2016-06-07 DIAGNOSIS — E1122 Type 2 diabetes mellitus with diabetic chronic kidney disease: Secondary | ICD-10-CM | POA: Diagnosis not present

## 2016-06-07 DIAGNOSIS — D509 Iron deficiency anemia, unspecified: Secondary | ICD-10-CM | POA: Diagnosis not present

## 2016-06-07 DIAGNOSIS — N2581 Secondary hyperparathyroidism of renal origin: Secondary | ICD-10-CM | POA: Diagnosis not present

## 2016-06-07 DIAGNOSIS — N186 End stage renal disease: Secondary | ICD-10-CM | POA: Diagnosis not present

## 2016-06-07 DIAGNOSIS — D689 Coagulation defect, unspecified: Secondary | ICD-10-CM | POA: Diagnosis not present

## 2016-06-07 DIAGNOSIS — D631 Anemia in chronic kidney disease: Secondary | ICD-10-CM | POA: Diagnosis not present

## 2016-06-07 DIAGNOSIS — Z23 Encounter for immunization: Secondary | ICD-10-CM | POA: Diagnosis not present

## 2016-06-09 ENCOUNTER — Ambulatory Visit: Payer: Medicare Other | Admitting: Cardiovascular Disease

## 2016-06-10 DIAGNOSIS — Z23 Encounter for immunization: Secondary | ICD-10-CM | POA: Diagnosis not present

## 2016-06-10 DIAGNOSIS — N2581 Secondary hyperparathyroidism of renal origin: Secondary | ICD-10-CM | POA: Diagnosis not present

## 2016-06-10 DIAGNOSIS — E1122 Type 2 diabetes mellitus with diabetic chronic kidney disease: Secondary | ICD-10-CM | POA: Diagnosis not present

## 2016-06-10 DIAGNOSIS — N186 End stage renal disease: Secondary | ICD-10-CM | POA: Diagnosis not present

## 2016-06-10 DIAGNOSIS — D631 Anemia in chronic kidney disease: Secondary | ICD-10-CM | POA: Diagnosis not present

## 2016-06-10 DIAGNOSIS — D509 Iron deficiency anemia, unspecified: Secondary | ICD-10-CM | POA: Diagnosis not present

## 2016-06-10 DIAGNOSIS — D689 Coagulation defect, unspecified: Secondary | ICD-10-CM | POA: Diagnosis not present

## 2016-06-12 DIAGNOSIS — D689 Coagulation defect, unspecified: Secondary | ICD-10-CM | POA: Diagnosis not present

## 2016-06-12 DIAGNOSIS — E1122 Type 2 diabetes mellitus with diabetic chronic kidney disease: Secondary | ICD-10-CM | POA: Diagnosis not present

## 2016-06-12 DIAGNOSIS — Z23 Encounter for immunization: Secondary | ICD-10-CM | POA: Diagnosis not present

## 2016-06-12 DIAGNOSIS — D509 Iron deficiency anemia, unspecified: Secondary | ICD-10-CM | POA: Diagnosis not present

## 2016-06-12 DIAGNOSIS — N186 End stage renal disease: Secondary | ICD-10-CM | POA: Diagnosis not present

## 2016-06-12 DIAGNOSIS — N2581 Secondary hyperparathyroidism of renal origin: Secondary | ICD-10-CM | POA: Diagnosis not present

## 2016-06-12 DIAGNOSIS — D631 Anemia in chronic kidney disease: Secondary | ICD-10-CM | POA: Diagnosis not present

## 2016-06-14 DIAGNOSIS — N2581 Secondary hyperparathyroidism of renal origin: Secondary | ICD-10-CM | POA: Diagnosis not present

## 2016-06-14 DIAGNOSIS — Z23 Encounter for immunization: Secondary | ICD-10-CM | POA: Diagnosis not present

## 2016-06-14 DIAGNOSIS — E1122 Type 2 diabetes mellitus with diabetic chronic kidney disease: Secondary | ICD-10-CM | POA: Diagnosis not present

## 2016-06-14 DIAGNOSIS — D631 Anemia in chronic kidney disease: Secondary | ICD-10-CM | POA: Diagnosis not present

## 2016-06-14 DIAGNOSIS — D509 Iron deficiency anemia, unspecified: Secondary | ICD-10-CM | POA: Diagnosis not present

## 2016-06-14 DIAGNOSIS — N186 End stage renal disease: Secondary | ICD-10-CM | POA: Diagnosis not present

## 2016-06-14 DIAGNOSIS — D689 Coagulation defect, unspecified: Secondary | ICD-10-CM | POA: Diagnosis not present

## 2016-06-17 DIAGNOSIS — D689 Coagulation defect, unspecified: Secondary | ICD-10-CM | POA: Diagnosis not present

## 2016-06-17 DIAGNOSIS — N2581 Secondary hyperparathyroidism of renal origin: Secondary | ICD-10-CM | POA: Diagnosis not present

## 2016-06-17 DIAGNOSIS — N186 End stage renal disease: Secondary | ICD-10-CM | POA: Diagnosis not present

## 2016-06-17 DIAGNOSIS — D509 Iron deficiency anemia, unspecified: Secondary | ICD-10-CM | POA: Diagnosis not present

## 2016-06-17 DIAGNOSIS — E1122 Type 2 diabetes mellitus with diabetic chronic kidney disease: Secondary | ICD-10-CM | POA: Diagnosis not present

## 2016-06-17 DIAGNOSIS — Z23 Encounter for immunization: Secondary | ICD-10-CM | POA: Diagnosis not present

## 2016-06-17 DIAGNOSIS — D631 Anemia in chronic kidney disease: Secondary | ICD-10-CM | POA: Diagnosis not present

## 2016-06-19 DIAGNOSIS — D689 Coagulation defect, unspecified: Secondary | ICD-10-CM | POA: Diagnosis not present

## 2016-06-19 DIAGNOSIS — E1122 Type 2 diabetes mellitus with diabetic chronic kidney disease: Secondary | ICD-10-CM | POA: Diagnosis not present

## 2016-06-19 DIAGNOSIS — N2581 Secondary hyperparathyroidism of renal origin: Secondary | ICD-10-CM | POA: Diagnosis not present

## 2016-06-19 DIAGNOSIS — N186 End stage renal disease: Secondary | ICD-10-CM | POA: Diagnosis not present

## 2016-06-19 DIAGNOSIS — D631 Anemia in chronic kidney disease: Secondary | ICD-10-CM | POA: Diagnosis not present

## 2016-06-19 DIAGNOSIS — Z23 Encounter for immunization: Secondary | ICD-10-CM | POA: Diagnosis not present

## 2016-06-19 DIAGNOSIS — D509 Iron deficiency anemia, unspecified: Secondary | ICD-10-CM | POA: Diagnosis not present

## 2016-06-21 DIAGNOSIS — D509 Iron deficiency anemia, unspecified: Secondary | ICD-10-CM | POA: Diagnosis not present

## 2016-06-21 DIAGNOSIS — D631 Anemia in chronic kidney disease: Secondary | ICD-10-CM | POA: Diagnosis not present

## 2016-06-21 DIAGNOSIS — N2581 Secondary hyperparathyroidism of renal origin: Secondary | ICD-10-CM | POA: Diagnosis not present

## 2016-06-21 DIAGNOSIS — N186 End stage renal disease: Secondary | ICD-10-CM | POA: Diagnosis not present

## 2016-06-21 DIAGNOSIS — Z23 Encounter for immunization: Secondary | ICD-10-CM | POA: Diagnosis not present

## 2016-06-21 DIAGNOSIS — D689 Coagulation defect, unspecified: Secondary | ICD-10-CM | POA: Diagnosis not present

## 2016-06-21 DIAGNOSIS — E1122 Type 2 diabetes mellitus with diabetic chronic kidney disease: Secondary | ICD-10-CM | POA: Diagnosis not present

## 2016-06-24 DIAGNOSIS — N2581 Secondary hyperparathyroidism of renal origin: Secondary | ICD-10-CM | POA: Diagnosis not present

## 2016-06-24 DIAGNOSIS — D631 Anemia in chronic kidney disease: Secondary | ICD-10-CM | POA: Diagnosis not present

## 2016-06-24 DIAGNOSIS — D509 Iron deficiency anemia, unspecified: Secondary | ICD-10-CM | POA: Diagnosis not present

## 2016-06-24 DIAGNOSIS — E1122 Type 2 diabetes mellitus with diabetic chronic kidney disease: Secondary | ICD-10-CM | POA: Diagnosis not present

## 2016-06-24 DIAGNOSIS — D689 Coagulation defect, unspecified: Secondary | ICD-10-CM | POA: Diagnosis not present

## 2016-06-24 DIAGNOSIS — Z23 Encounter for immunization: Secondary | ICD-10-CM | POA: Diagnosis not present

## 2016-06-24 DIAGNOSIS — N186 End stage renal disease: Secondary | ICD-10-CM | POA: Diagnosis not present

## 2016-06-25 DIAGNOSIS — D509 Iron deficiency anemia, unspecified: Secondary | ICD-10-CM | POA: Diagnosis not present

## 2016-06-25 DIAGNOSIS — D631 Anemia in chronic kidney disease: Secondary | ICD-10-CM | POA: Diagnosis not present

## 2016-06-25 DIAGNOSIS — E1122 Type 2 diabetes mellitus with diabetic chronic kidney disease: Secondary | ICD-10-CM | POA: Diagnosis not present

## 2016-06-25 DIAGNOSIS — N2581 Secondary hyperparathyroidism of renal origin: Secondary | ICD-10-CM | POA: Diagnosis not present

## 2016-06-25 DIAGNOSIS — D689 Coagulation defect, unspecified: Secondary | ICD-10-CM | POA: Diagnosis not present

## 2016-06-25 DIAGNOSIS — Z23 Encounter for immunization: Secondary | ICD-10-CM | POA: Diagnosis not present

## 2016-06-25 DIAGNOSIS — N186 End stage renal disease: Secondary | ICD-10-CM | POA: Diagnosis not present

## 2016-06-26 DIAGNOSIS — E1122 Type 2 diabetes mellitus with diabetic chronic kidney disease: Secondary | ICD-10-CM | POA: Diagnosis not present

## 2016-06-26 DIAGNOSIS — D689 Coagulation defect, unspecified: Secondary | ICD-10-CM | POA: Diagnosis not present

## 2016-06-26 DIAGNOSIS — N186 End stage renal disease: Secondary | ICD-10-CM | POA: Diagnosis not present

## 2016-06-26 DIAGNOSIS — Z23 Encounter for immunization: Secondary | ICD-10-CM | POA: Diagnosis not present

## 2016-06-26 DIAGNOSIS — D509 Iron deficiency anemia, unspecified: Secondary | ICD-10-CM | POA: Diagnosis not present

## 2016-06-26 DIAGNOSIS — D631 Anemia in chronic kidney disease: Secondary | ICD-10-CM | POA: Diagnosis not present

## 2016-06-26 DIAGNOSIS — N2581 Secondary hyperparathyroidism of renal origin: Secondary | ICD-10-CM | POA: Diagnosis not present

## 2016-06-28 DIAGNOSIS — Z23 Encounter for immunization: Secondary | ICD-10-CM | POA: Diagnosis not present

## 2016-06-28 DIAGNOSIS — E1122 Type 2 diabetes mellitus with diabetic chronic kidney disease: Secondary | ICD-10-CM | POA: Diagnosis not present

## 2016-06-28 DIAGNOSIS — N2581 Secondary hyperparathyroidism of renal origin: Secondary | ICD-10-CM | POA: Diagnosis not present

## 2016-06-28 DIAGNOSIS — D631 Anemia in chronic kidney disease: Secondary | ICD-10-CM | POA: Diagnosis not present

## 2016-06-28 DIAGNOSIS — D689 Coagulation defect, unspecified: Secondary | ICD-10-CM | POA: Diagnosis not present

## 2016-06-28 DIAGNOSIS — D509 Iron deficiency anemia, unspecified: Secondary | ICD-10-CM | POA: Diagnosis not present

## 2016-06-28 DIAGNOSIS — N186 End stage renal disease: Secondary | ICD-10-CM | POA: Diagnosis not present

## 2016-06-30 ENCOUNTER — Encounter: Payer: Self-pay | Admitting: Vascular Surgery

## 2016-07-01 DIAGNOSIS — Z23 Encounter for immunization: Secondary | ICD-10-CM | POA: Diagnosis not present

## 2016-07-01 DIAGNOSIS — E1122 Type 2 diabetes mellitus with diabetic chronic kidney disease: Secondary | ICD-10-CM | POA: Diagnosis not present

## 2016-07-01 DIAGNOSIS — N186 End stage renal disease: Secondary | ICD-10-CM | POA: Diagnosis not present

## 2016-07-01 DIAGNOSIS — N2581 Secondary hyperparathyroidism of renal origin: Secondary | ICD-10-CM | POA: Diagnosis not present

## 2016-07-01 DIAGNOSIS — D689 Coagulation defect, unspecified: Secondary | ICD-10-CM | POA: Diagnosis not present

## 2016-07-01 DIAGNOSIS — D509 Iron deficiency anemia, unspecified: Secondary | ICD-10-CM | POA: Diagnosis not present

## 2016-07-01 DIAGNOSIS — D631 Anemia in chronic kidney disease: Secondary | ICD-10-CM | POA: Diagnosis not present

## 2016-07-02 DIAGNOSIS — I129 Hypertensive chronic kidney disease with stage 1 through stage 4 chronic kidney disease, or unspecified chronic kidney disease: Secondary | ICD-10-CM | POA: Diagnosis not present

## 2016-07-02 DIAGNOSIS — Z992 Dependence on renal dialysis: Secondary | ICD-10-CM | POA: Diagnosis not present

## 2016-07-02 DIAGNOSIS — N186 End stage renal disease: Secondary | ICD-10-CM | POA: Diagnosis not present

## 2016-07-03 ENCOUNTER — Ambulatory Visit (INDEPENDENT_AMBULATORY_CARE_PROVIDER_SITE_OTHER): Payer: Medicare Other | Admitting: Vascular Surgery

## 2016-07-03 ENCOUNTER — Encounter: Payer: Self-pay | Admitting: Vascular Surgery

## 2016-07-03 VITALS — BP 134/85 | HR 112 | Temp 97.1°F | Resp 20 | Ht 66.0 in | Wt 343.0 lb

## 2016-07-03 DIAGNOSIS — N2581 Secondary hyperparathyroidism of renal origin: Secondary | ICD-10-CM | POA: Diagnosis not present

## 2016-07-03 DIAGNOSIS — N186 End stage renal disease: Secondary | ICD-10-CM | POA: Diagnosis not present

## 2016-07-03 DIAGNOSIS — D689 Coagulation defect, unspecified: Secondary | ICD-10-CM | POA: Diagnosis not present

## 2016-07-03 DIAGNOSIS — Z992 Dependence on renal dialysis: Secondary | ICD-10-CM | POA: Diagnosis not present

## 2016-07-03 DIAGNOSIS — E1122 Type 2 diabetes mellitus with diabetic chronic kidney disease: Secondary | ICD-10-CM | POA: Diagnosis not present

## 2016-07-03 NOTE — Progress Notes (Signed)
Patient is a 61 year old female who returns for follow-up today after second stage basilic vein transposition fistula in her left arm. They're currently cannulating the fistula. There able to cannulate it without difficulty on most occasions. The patient denies any numbness or tingling in her hand. She has one place on her vein harvest site which still has a slight skin erosion but overall appears to be healed.  Review of systems: She denies shortness of breath. She denies chest pain.  Physical exam:  Vitals:   07/03/16 0856  BP: 134/85  Pulse: (!) 112  Resp: 20  Temp: 97.1 F (36.2 C)  TempSrc: Oral  SpO2: 96%  Weight: (!) 343 lb (155.6 kg)  Height: 5\' 6"  (1.676 m)    Left Upper extremity: Palpable thrill in fistula the fistula is palpable throughout the upper arm absent left radial pulse all incisions essentially healed  Assessment: Doing well status post basilic vein transposition fistula.  Plan: Follow-up as needed  Ruta Hinds, MD [facility] Office: [phone] Pager: [phone]

## 2016-07-05 DIAGNOSIS — N2581 Secondary hyperparathyroidism of renal origin: Secondary | ICD-10-CM | POA: Diagnosis not present

## 2016-07-05 DIAGNOSIS — N186 End stage renal disease: Secondary | ICD-10-CM | POA: Diagnosis not present

## 2016-07-05 DIAGNOSIS — D689 Coagulation defect, unspecified: Secondary | ICD-10-CM | POA: Diagnosis not present

## 2016-07-05 DIAGNOSIS — E1122 Type 2 diabetes mellitus with diabetic chronic kidney disease: Secondary | ICD-10-CM | POA: Diagnosis not present

## 2016-07-08 DIAGNOSIS — E1122 Type 2 diabetes mellitus with diabetic chronic kidney disease: Secondary | ICD-10-CM | POA: Diagnosis not present

## 2016-07-08 DIAGNOSIS — D689 Coagulation defect, unspecified: Secondary | ICD-10-CM | POA: Diagnosis not present

## 2016-07-08 DIAGNOSIS — N186 End stage renal disease: Secondary | ICD-10-CM | POA: Diagnosis not present

## 2016-07-08 DIAGNOSIS — N2581 Secondary hyperparathyroidism of renal origin: Secondary | ICD-10-CM | POA: Diagnosis not present

## 2016-07-10 DIAGNOSIS — N2581 Secondary hyperparathyroidism of renal origin: Secondary | ICD-10-CM | POA: Diagnosis not present

## 2016-07-10 DIAGNOSIS — E1122 Type 2 diabetes mellitus with diabetic chronic kidney disease: Secondary | ICD-10-CM | POA: Diagnosis not present

## 2016-07-10 DIAGNOSIS — D689 Coagulation defect, unspecified: Secondary | ICD-10-CM | POA: Diagnosis not present

## 2016-07-10 DIAGNOSIS — N186 End stage renal disease: Secondary | ICD-10-CM | POA: Diagnosis not present

## 2016-07-12 DIAGNOSIS — D689 Coagulation defect, unspecified: Secondary | ICD-10-CM | POA: Diagnosis not present

## 2016-07-12 DIAGNOSIS — N186 End stage renal disease: Secondary | ICD-10-CM | POA: Diagnosis not present

## 2016-07-12 DIAGNOSIS — N2581 Secondary hyperparathyroidism of renal origin: Secondary | ICD-10-CM | POA: Diagnosis not present

## 2016-07-12 DIAGNOSIS — E1122 Type 2 diabetes mellitus with diabetic chronic kidney disease: Secondary | ICD-10-CM | POA: Diagnosis not present

## 2016-07-15 DIAGNOSIS — E1122 Type 2 diabetes mellitus with diabetic chronic kidney disease: Secondary | ICD-10-CM | POA: Diagnosis not present

## 2016-07-15 DIAGNOSIS — N186 End stage renal disease: Secondary | ICD-10-CM | POA: Diagnosis not present

## 2016-07-15 DIAGNOSIS — N2581 Secondary hyperparathyroidism of renal origin: Secondary | ICD-10-CM | POA: Diagnosis not present

## 2016-07-15 DIAGNOSIS — D689 Coagulation defect, unspecified: Secondary | ICD-10-CM | POA: Diagnosis not present

## 2016-07-17 DIAGNOSIS — D689 Coagulation defect, unspecified: Secondary | ICD-10-CM | POA: Diagnosis not present

## 2016-07-17 DIAGNOSIS — N186 End stage renal disease: Secondary | ICD-10-CM | POA: Diagnosis not present

## 2016-07-17 DIAGNOSIS — E1122 Type 2 diabetes mellitus with diabetic chronic kidney disease: Secondary | ICD-10-CM | POA: Diagnosis not present

## 2016-07-17 DIAGNOSIS — N2581 Secondary hyperparathyroidism of renal origin: Secondary | ICD-10-CM | POA: Diagnosis not present

## 2016-07-19 DIAGNOSIS — E1122 Type 2 diabetes mellitus with diabetic chronic kidney disease: Secondary | ICD-10-CM | POA: Diagnosis not present

## 2016-07-19 DIAGNOSIS — N2581 Secondary hyperparathyroidism of renal origin: Secondary | ICD-10-CM | POA: Diagnosis not present

## 2016-07-19 DIAGNOSIS — D689 Coagulation defect, unspecified: Secondary | ICD-10-CM | POA: Diagnosis not present

## 2016-07-19 DIAGNOSIS — N186 End stage renal disease: Secondary | ICD-10-CM | POA: Diagnosis not present

## 2016-07-22 DIAGNOSIS — E1122 Type 2 diabetes mellitus with diabetic chronic kidney disease: Secondary | ICD-10-CM | POA: Diagnosis not present

## 2016-07-22 DIAGNOSIS — D689 Coagulation defect, unspecified: Secondary | ICD-10-CM | POA: Diagnosis not present

## 2016-07-22 DIAGNOSIS — N2581 Secondary hyperparathyroidism of renal origin: Secondary | ICD-10-CM | POA: Diagnosis not present

## 2016-07-22 DIAGNOSIS — N186 End stage renal disease: Secondary | ICD-10-CM | POA: Diagnosis not present

## 2016-07-24 DIAGNOSIS — E1122 Type 2 diabetes mellitus with diabetic chronic kidney disease: Secondary | ICD-10-CM | POA: Diagnosis not present

## 2016-07-24 DIAGNOSIS — N186 End stage renal disease: Secondary | ICD-10-CM | POA: Diagnosis not present

## 2016-07-24 DIAGNOSIS — D689 Coagulation defect, unspecified: Secondary | ICD-10-CM | POA: Diagnosis not present

## 2016-07-24 DIAGNOSIS — N2581 Secondary hyperparathyroidism of renal origin: Secondary | ICD-10-CM | POA: Diagnosis not present

## 2016-07-26 DIAGNOSIS — N186 End stage renal disease: Secondary | ICD-10-CM | POA: Diagnosis not present

## 2016-07-26 DIAGNOSIS — E1122 Type 2 diabetes mellitus with diabetic chronic kidney disease: Secondary | ICD-10-CM | POA: Diagnosis not present

## 2016-07-26 DIAGNOSIS — N2581 Secondary hyperparathyroidism of renal origin: Secondary | ICD-10-CM | POA: Diagnosis not present

## 2016-07-26 DIAGNOSIS — D689 Coagulation defect, unspecified: Secondary | ICD-10-CM | POA: Diagnosis not present

## 2016-07-29 DIAGNOSIS — D689 Coagulation defect, unspecified: Secondary | ICD-10-CM | POA: Diagnosis not present

## 2016-07-29 DIAGNOSIS — N2581 Secondary hyperparathyroidism of renal origin: Secondary | ICD-10-CM | POA: Diagnosis not present

## 2016-07-29 DIAGNOSIS — E1122 Type 2 diabetes mellitus with diabetic chronic kidney disease: Secondary | ICD-10-CM | POA: Diagnosis not present

## 2016-07-29 DIAGNOSIS — N186 End stage renal disease: Secondary | ICD-10-CM | POA: Diagnosis not present

## 2016-07-30 DIAGNOSIS — I129 Hypertensive chronic kidney disease with stage 1 through stage 4 chronic kidney disease, or unspecified chronic kidney disease: Secondary | ICD-10-CM | POA: Diagnosis not present

## 2016-07-30 DIAGNOSIS — N186 End stage renal disease: Secondary | ICD-10-CM | POA: Diagnosis not present

## 2016-07-30 DIAGNOSIS — Z992 Dependence on renal dialysis: Secondary | ICD-10-CM | POA: Diagnosis not present

## 2016-07-31 DIAGNOSIS — N186 End stage renal disease: Secondary | ICD-10-CM | POA: Diagnosis not present

## 2016-07-31 DIAGNOSIS — N2581 Secondary hyperparathyroidism of renal origin: Secondary | ICD-10-CM | POA: Diagnosis not present

## 2016-07-31 DIAGNOSIS — Z23 Encounter for immunization: Secondary | ICD-10-CM | POA: Diagnosis not present

## 2016-07-31 DIAGNOSIS — D631 Anemia in chronic kidney disease: Secondary | ICD-10-CM | POA: Diagnosis not present

## 2016-07-31 DIAGNOSIS — E1122 Type 2 diabetes mellitus with diabetic chronic kidney disease: Secondary | ICD-10-CM | POA: Diagnosis not present

## 2016-07-31 DIAGNOSIS — D689 Coagulation defect, unspecified: Secondary | ICD-10-CM | POA: Diagnosis not present

## 2016-08-02 DIAGNOSIS — N186 End stage renal disease: Secondary | ICD-10-CM | POA: Diagnosis not present

## 2016-08-02 DIAGNOSIS — E1122 Type 2 diabetes mellitus with diabetic chronic kidney disease: Secondary | ICD-10-CM | POA: Diagnosis not present

## 2016-08-02 DIAGNOSIS — D631 Anemia in chronic kidney disease: Secondary | ICD-10-CM | POA: Diagnosis not present

## 2016-08-02 DIAGNOSIS — Z23 Encounter for immunization: Secondary | ICD-10-CM | POA: Diagnosis not present

## 2016-08-02 DIAGNOSIS — N2581 Secondary hyperparathyroidism of renal origin: Secondary | ICD-10-CM | POA: Diagnosis not present

## 2016-08-02 DIAGNOSIS — D689 Coagulation defect, unspecified: Secondary | ICD-10-CM | POA: Diagnosis not present

## 2016-08-04 ENCOUNTER — Ambulatory Visit (INDEPENDENT_AMBULATORY_CARE_PROVIDER_SITE_OTHER): Payer: Medicare Other | Admitting: Cardiovascular Disease

## 2016-08-04 ENCOUNTER — Encounter: Payer: Self-pay | Admitting: Cardiovascular Disease

## 2016-08-04 VITALS — BP 102/70 | HR 75 | Ht 66.0 in | Wt 342.0 lb

## 2016-08-04 DIAGNOSIS — E1121 Type 2 diabetes mellitus with diabetic nephropathy: Secondary | ICD-10-CM | POA: Diagnosis not present

## 2016-08-04 DIAGNOSIS — G4733 Obstructive sleep apnea (adult) (pediatric): Secondary | ICD-10-CM

## 2016-08-04 DIAGNOSIS — E785 Hyperlipidemia, unspecified: Secondary | ICD-10-CM

## 2016-08-04 DIAGNOSIS — I5032 Chronic diastolic (congestive) heart failure: Secondary | ICD-10-CM | POA: Diagnosis not present

## 2016-08-04 DIAGNOSIS — I10 Essential (primary) hypertension: Secondary | ICD-10-CM | POA: Diagnosis not present

## 2016-08-04 DIAGNOSIS — Z992 Dependence on renal dialysis: Secondary | ICD-10-CM

## 2016-08-04 DIAGNOSIS — N186 End stage renal disease: Secondary | ICD-10-CM

## 2016-08-04 NOTE — Patient Instructions (Signed)
Your physician wants you to follow-up in: 6 MONTHS OR SOONER IF NEEDED. You will receive a reminder letter in the mail two months in advance. If you don't receive a letter, please call our office to schedule the follow-up appointment.  If you need a refill on your cardiac medications before your next appointment, please call your pharmacy.

## 2016-08-04 NOTE — Progress Notes (Signed)
Cardiology Office Note    Date:  08/10/2016   ID:  [name], DOB [birth year], MRN [id number]  PCP:  Dwan Bolt, MD  Cardiologist:  Shelva Majestic, MD   Chief Complaint  Patient presents with  . New Patient (Initial Visit)    transferring care from Dr Irish Lack requesting Dr Claiborne Billings    History of Present Illness:  [name] is a 61 y.o. female  who was seen by Dr. Irish Lack in May 2017.  She has a history of hypertension, hyperlipidemia, obstructive sleep apnea, stage IV renal disease and was not yet on dialysis.  She has had heart failure symptoms and volume overload.  She was admitted to [facility] in May 2017 for acute on chronic heart failure in the setting of worsening renal failure.  During that hospitalization, troponins were elevated.  An echo Doppler study showed an EF of 55-60%, severe LVH, and mild PA hypertension.  She was started on hemodialysis and required AV fistula revision.  She was transfused due to worsening edema and ultimately was hospitalized for close to one month and later discharged to skilled nursing facility.  She was seen in follow-up in the office and was tachycardic.  She was last seen in August 2017 by how Main, PA and was having some mild dizziness after dialysis. She was instructed to hold the night dose of Toprol prior to her dialysis session.  The patient has requested establishment of care with me and presents for evaluation.  She denies any episodes of chest tightness.  She denies recent episodes of dizziness.  She is followed by Dr. Gareth Eagle for her primary care.  She is unaware of palpitations.  Her volume overload has resolved with implementation of dialysis.  She has a history of obstructive sleep apnea and is followed at [facility] by Dr. Shelia Media.  I calculated an Epworth Sleepiness Scale in the office today, which was elevated at 12 suggestive of residual daytime sleepiness.   Past Medical History:  Diagnosis Date  .  Arthritis   . Chronic diastolic CHF (congestive heart failure) ([place])    a. Echo 5/17: severe LVH, EF 55-60%, mild AS, mean AV gradient 10 mmHg, mod LAE, PASP 37 mmHg  . Diabetes mellitus without complication ([place])    type 2  . ESRD (end stage renal disease) ([facility])    Tues, Thurs, Sat dialysis  . H/O blood clots    many years ago in leg  . History of pneumonia   . HLD (hyperlipidemia)   . Hypertension    BP low after dialysis started, no further BP meds  . Seizures ([place])    had one or two (more than 10-15 years ago)  . Sleep apnea    uses c-pap most of time    Past Surgical History:  Procedure Laterality Date  . ABDOMINAL HYSTERECTOMY    . AV FISTULA PLACEMENT Left 07/25/2015   Procedure: ARTERIOVENOUS (AV) FISTULA CREATION -LEFT BRACHIOCEPHALIC;  Surgeon: Conrad Bergholz, MD;  Location: [facility];  Service: Vascular;  Laterality: Left;  . COLONOSCOPY    . FISTULA SUPERFICIALIZATION Left 11/01/2015   Procedure: SUPERFICIALIZATION OF LEFT ARM BRACHIOCEPHALIC FISTULA ;  Surgeon: Conrad [place], MD;  Location: [facility];  Service: Vascular;  Laterality: Left;  . INSERTION OF DIALYSIS CATHETER Right 10/09/2015   Procedure: INSERTION OF DIALYSIS CATHETER RIGHT INTERNAL JUGULAR ;  Surgeon: Angelia Mould, MD;  Location: [place] [place];  Service: Vascular;  Laterality: Right;  Current Medications: Outpatient Medications Prior to Visit  Medication Sig Dispense Refill  . aspirin 81 MG tablet Take 81 mg by mouth daily.    Marland Kitchen atorvastatin (LIPITOR) 40 MG tablet Take 40 mg by mouth daily.    . cinacalcet (SENSIPAR) 60 MG tablet Take 60 mg by mouth daily.    . febuxostat (ULORIC) 40 MG tablet Take 40 mg by mouth daily.    Marland Kitchen FOSRENOL 1000 MG chewable tablet Chew 1,000 mg by mouth 3 (three) times daily with meals.    . metoprolol succinate (TOPROL-XL) 50 MG 24 hr tablet Take 50 mg by mouth daily. DO NOT TAKE THE NIGHT PRIOR TO YOUR DIALYSIS  0  . multivitamin (RENA-VIT) TABS tablet Take 1 tablet by mouth daily.  30 each 0  . ONETOUCH VERIO test strip TEST 2 TIMES A DAY AND AS NEEDED FOR BLOOD SUGAR  0  . senna-docusate (SENOKOT-S) 8.6-50 MG tablet Take 2 tablets by mouth 2 (two) times daily. 120 tablet 0  . polyethylene glycol (MIRALAX / GLYCOLAX) packet Take 17 g by mouth 2 (two) times daily as needed for constipation.  0   No facility-administered medications prior to visit.      Allergies:   Morphine and related   Social History   Social History  . Marital status: Single    Spouse name: N/A  . Number of children: N/A  . Years of education: N/A   Social History Main Topics  . Smoking status: Never Smoker  . Smokeless tobacco: Never Used  . Alcohol use No  . Drug use: No  . Sexual activity: Not Asked   Other Topics Concern  . None   Social History Narrative   epworth sleepiness scale score 12    I have taken care of her family member, [name]  Family History:  The patient's family history includes Cancer in her mother; Heart attack in her paternal aunt and sister; Hypertension in her brother, brother, father, sister, sister, sister, and sister.   ROS General: Negative; No fevers, chills, or night sweats;  HEENT: Negative; No changes in vision or hearing, sinus congestion, difficulty swallowing Pulmonary: Negative; No cough, wheezing, shortness of breath, hemoptysis Cardiovascular: Negative; No chest pain, presyncope, syncope, palpitations GI: Negative; No nausea, vomiting, diarrhea, or abdominal pain GU: Negative; No dysuria, hematuria, or difficulty voiding Musculoskeletal: Negative; no myalgias, joint pain, or weakness Hematologic/Oncology: Negative; no easy bruising, bleeding Endocrine: Negative; no heat/cold intolerance; no diabetes Neuro: Negative; no changes in balance, headaches Skin: Negative; No rashes or skin lesions Psychiatric: Negative; No behavioral problems, depression Sleep: Negative; No snoring, daytime sleepiness, hypersomnolence, bruxism, restless  legs, hypnogognic hallucinations, no cataplexy Other comprehensive 14 point system review is negative.   PHYSICAL EXAM:   VS:  BP 102/70   Pulse 75   Ht '5\' 6"'  (1.676 m)   Wt (!) 342 lb (155.1 kg)   BMI 55.20 kg/m    Wt Readings from Last 3 Encounters:  08/04/16 (!) 342 lb (155.1 kg)  07/03/16 (!) 343 lb (155.6 kg)  04/09/16 (!) 337 lb (152.9 kg)    General: Alert, oriented, no distress.  Skin: normal turgor, no rashes, warm and dry HEENT: Normocephalic, atraumatic. Pupils equal round and reactive to light; sclera anicteric; extraocular muscles intact; Fundi arteriolar narrowing Nose without nasal septal hypertrophy Mouth/Parynx benign; Mallinpatti scale 3 Neck: No JVD, no carotid bruits; normal carotid upstroke Lungs: clear to ausculatation and percussion; no wheezing or rales Chest wall: without tenderness to palpitation  Heart: PMI not displaced, RRR, s1 s2 normal, 1/6 systolic murmur, no diastolic murmur, no rubs, gallops, thrills, or heaves Abdomen: soft, nontender; no hepatosplenomehaly, BS+; abdominal aorta nontender and not dilated by palpation. Back: no CVA tenderness Pulses 2+ Musculoskeletal: full range of motion, normal strength, no joint deformities Extremities: Palpable thrill in the AV fistula in the left upper extremity.  No significant edema  Neurologic: grossly nonfocal; Cranial nerves grossly wnl Psychologic: Normal mood and affect   Studies/Labs Reviewed:   EKG:  EKG is ordered today.  ECG (independently read by me): Normal sinus rhythm at 75 bpm.  Nonspecific ST changes.  Normal intervals.  Recent Labs: BMP Latest Ref Rng & Units 11/06/2015 11/03/2015 11/01/2015  Glucose 65 - 99 mg/dL 97 87 80  BUN 6 - 20 mg/dL 25(H) 20 -  Creatinine 0.44 - 1.00 mg/dL 8.13(H) 7.41(H) -  Sodium 135 - 145 mmol/L 135 136 132(L)  Potassium 3.5 - 5.1 mmol/L 4.3 3.8 4.2  Chloride 101 - 111 mmol/L 96(L) 97(L) -  CO2 22 - 32 mmol/L 30 28 -  Calcium 8.9 - 10.3 mg/dL 8.2(L) 8.3(L) -       Hepatic Function Latest Ref Rng & Units 11/06/2015 11/03/2015 10/30/2015  Total Protein 6.5 - 8.1 g/dL - - -  Albumin 3.5 - 5.0 g/dL 2.6(L) 2.6(L) 2.6(L)  AST 15 - 41 U/L - - -  ALT 14 - 54 U/L - - -  Alk Phosphatase 38 - 126 U/L - - -  Total Bilirubin 0.3 - 1.2 mg/dL - - -    CBC Latest Ref Rng & Units 11/06/2015 11/03/2015 11/01/2015  WBC 4.0 - 10.5 K/uL 6.2 7.0 -  Hemoglobin 12.0 - 15.0 g/dL 10.0(L) 10.0(L) 12.9  Hematocrit 36.0 - 46.0 % 33.4(L) 32.7(L) 38.0  Platelets 150 - 400 K/uL 245 199 -   Lab Results  Component Value Date   MCV 89.8 11/06/2015   MCV 88.9 11/03/2015   MCV 90.3 10/30/2015   Lab Results  Component Value Date   TSH 1.541 10/07/2015   Lab Results  Component Value Date   HGBA1C 6.2 (H) 10/07/2015     BNP    Component Value Date/Time   BNP 464.4 (H) 10/04/2015 2319    ProBNP No results found for: PROBNP   Lipid Panel  No results found for: CHOL, TRIG, HDL, CHOLHDL, VLDL, LDLCALC, LDLDIRECT   RADIOLOGY: No results found.   Additional studies/ records that were reviewed today include:  I have reviewed the office notes from her prior providers.  I reviewed her hospitalization.  I reviewed laboratory.    ASSESSMENT:    1. Essential hypertension   2. Type 2 diabetes mellitus with diabetic nephropathy, without long-term current use of insulin ([place])   3. Chronic diastolic CHF (congestive heart failure) ([place])   4. Hyperlipidemia with target LDL less than 70   5. Morbid obesity, unspecified obesity type ([place])   6. ESRD on dialysis ([place])   7. Chronic diastolic heart failure ([place])   8. OSA (obstructive sleep apnea)      PLAN:  Ms. Dwyer is a 61 year old female who is a history of hypertension, hyperlipidemia, obstructive sleep apnea, and end-stage renal disease.  She has been on dialysis since May 2017.  Prior to that, she had issues with significant volume overload and acute on chronic diastolic heart failure.  Since being on dialysis, her  volume status has stabilized.  She transiently had had issues with dialysis and her medications have been  reduced.  Presently, she is maintaining sinus rhythm.  Her blood pressure today is low normal on her current regimen of Toprol-XL 50 mg, which she takes on nondialysis days.  He is on atorvastatin 40 mg for hyperlipidemia with target LDL less than 70.  She also is on uloric.  At present, she is compensated and does not have overt CHF symptoms or signs.  I will try to obtain the blood work that has been done by Dr. Wilson Singer.  She is on CPAP therapy and has been followed by Dr. Deland Pretty.  She cannot recall her recent download.  Presently, she denies any palpitations or episodes of tachycardia.  She will continue current therapy.  She is morbidly obese with a BMI of 55.2.  Weight reduction was strongly encouraged.  I will see her in 6 months for reevaluation.    Medication Adjustments/Labs and Tests Ordered: Current medicines are reviewed at length with the patient today.  Concerns regarding medicines are outlined above.  Medication changes, Labs and Tests ordered today are listed in the Patient Instructions below. Patient Instructions  Your physician wants you to follow-up in: 6 MONTHS OR SOONER IF NEEDED. You will receive a reminder letter in the mail two months in advance. If you don't receive a letter, please call our office to schedule the follow-up appointment.  If you need a refill on your cardiac medications before your next appointment, please call your pharmacy.      Signed, Shelva Majestic, MD  08/10/2016 12:22 PM    [facility] [address], [place], [place], [place]  [address] Phone: [phone]

## 2016-08-05 DIAGNOSIS — E1122 Type 2 diabetes mellitus with diabetic chronic kidney disease: Secondary | ICD-10-CM | POA: Diagnosis not present

## 2016-08-05 DIAGNOSIS — N186 End stage renal disease: Secondary | ICD-10-CM | POA: Diagnosis not present

## 2016-08-05 DIAGNOSIS — Z23 Encounter for immunization: Secondary | ICD-10-CM | POA: Diagnosis not present

## 2016-08-05 DIAGNOSIS — D631 Anemia in chronic kidney disease: Secondary | ICD-10-CM | POA: Diagnosis not present

## 2016-08-05 DIAGNOSIS — D689 Coagulation defect, unspecified: Secondary | ICD-10-CM | POA: Diagnosis not present

## 2016-08-05 DIAGNOSIS — N2581 Secondary hyperparathyroidism of renal origin: Secondary | ICD-10-CM | POA: Diagnosis not present

## 2016-08-07 DIAGNOSIS — N2581 Secondary hyperparathyroidism of renal origin: Secondary | ICD-10-CM | POA: Diagnosis not present

## 2016-08-07 DIAGNOSIS — D689 Coagulation defect, unspecified: Secondary | ICD-10-CM | POA: Diagnosis not present

## 2016-08-07 DIAGNOSIS — D631 Anemia in chronic kidney disease: Secondary | ICD-10-CM | POA: Diagnosis not present

## 2016-08-07 DIAGNOSIS — E1122 Type 2 diabetes mellitus with diabetic chronic kidney disease: Secondary | ICD-10-CM | POA: Diagnosis not present

## 2016-08-07 DIAGNOSIS — Z23 Encounter for immunization: Secondary | ICD-10-CM | POA: Diagnosis not present

## 2016-08-07 DIAGNOSIS — N186 End stage renal disease: Secondary | ICD-10-CM | POA: Diagnosis not present

## 2016-08-09 DIAGNOSIS — Z23 Encounter for immunization: Secondary | ICD-10-CM | POA: Diagnosis not present

## 2016-08-09 DIAGNOSIS — N186 End stage renal disease: Secondary | ICD-10-CM | POA: Diagnosis not present

## 2016-08-09 DIAGNOSIS — E1122 Type 2 diabetes mellitus with diabetic chronic kidney disease: Secondary | ICD-10-CM | POA: Diagnosis not present

## 2016-08-09 DIAGNOSIS — D689 Coagulation defect, unspecified: Secondary | ICD-10-CM | POA: Diagnosis not present

## 2016-08-09 DIAGNOSIS — D631 Anemia in chronic kidney disease: Secondary | ICD-10-CM | POA: Diagnosis not present

## 2016-08-09 DIAGNOSIS — N2581 Secondary hyperparathyroidism of renal origin: Secondary | ICD-10-CM | POA: Diagnosis not present

## 2016-08-12 DIAGNOSIS — D631 Anemia in chronic kidney disease: Secondary | ICD-10-CM | POA: Diagnosis not present

## 2016-08-12 DIAGNOSIS — D689 Coagulation defect, unspecified: Secondary | ICD-10-CM | POA: Diagnosis not present

## 2016-08-12 DIAGNOSIS — N186 End stage renal disease: Secondary | ICD-10-CM | POA: Diagnosis not present

## 2016-08-12 DIAGNOSIS — Z23 Encounter for immunization: Secondary | ICD-10-CM | POA: Diagnosis not present

## 2016-08-12 DIAGNOSIS — N2581 Secondary hyperparathyroidism of renal origin: Secondary | ICD-10-CM | POA: Diagnosis not present

## 2016-08-12 DIAGNOSIS — E1122 Type 2 diabetes mellitus with diabetic chronic kidney disease: Secondary | ICD-10-CM | POA: Diagnosis not present

## 2016-08-14 DIAGNOSIS — Z23 Encounter for immunization: Secondary | ICD-10-CM | POA: Diagnosis not present

## 2016-08-14 DIAGNOSIS — D689 Coagulation defect, unspecified: Secondary | ICD-10-CM | POA: Diagnosis not present

## 2016-08-14 DIAGNOSIS — D631 Anemia in chronic kidney disease: Secondary | ICD-10-CM | POA: Diagnosis not present

## 2016-08-14 DIAGNOSIS — E1122 Type 2 diabetes mellitus with diabetic chronic kidney disease: Secondary | ICD-10-CM | POA: Diagnosis not present

## 2016-08-14 DIAGNOSIS — N2581 Secondary hyperparathyroidism of renal origin: Secondary | ICD-10-CM | POA: Diagnosis not present

## 2016-08-14 DIAGNOSIS — N186 End stage renal disease: Secondary | ICD-10-CM | POA: Diagnosis not present

## 2016-08-16 DIAGNOSIS — D689 Coagulation defect, unspecified: Secondary | ICD-10-CM | POA: Diagnosis not present

## 2016-08-16 DIAGNOSIS — N2581 Secondary hyperparathyroidism of renal origin: Secondary | ICD-10-CM | POA: Diagnosis not present

## 2016-08-16 DIAGNOSIS — Z23 Encounter for immunization: Secondary | ICD-10-CM | POA: Diagnosis not present

## 2016-08-16 DIAGNOSIS — N186 End stage renal disease: Secondary | ICD-10-CM | POA: Diagnosis not present

## 2016-08-16 DIAGNOSIS — D631 Anemia in chronic kidney disease: Secondary | ICD-10-CM | POA: Diagnosis not present

## 2016-08-16 DIAGNOSIS — E1122 Type 2 diabetes mellitus with diabetic chronic kidney disease: Secondary | ICD-10-CM | POA: Diagnosis not present

## 2016-08-18 DIAGNOSIS — D631 Anemia in chronic kidney disease: Secondary | ICD-10-CM | POA: Diagnosis not present

## 2016-08-18 DIAGNOSIS — N186 End stage renal disease: Secondary | ICD-10-CM | POA: Diagnosis not present

## 2016-08-18 DIAGNOSIS — E1122 Type 2 diabetes mellitus with diabetic chronic kidney disease: Secondary | ICD-10-CM | POA: Diagnosis not present

## 2016-08-18 DIAGNOSIS — Z23 Encounter for immunization: Secondary | ICD-10-CM | POA: Diagnosis not present

## 2016-08-18 DIAGNOSIS — D689 Coagulation defect, unspecified: Secondary | ICD-10-CM | POA: Diagnosis not present

## 2016-08-18 DIAGNOSIS — N2581 Secondary hyperparathyroidism of renal origin: Secondary | ICD-10-CM | POA: Diagnosis not present

## 2016-08-19 DIAGNOSIS — D631 Anemia in chronic kidney disease: Secondary | ICD-10-CM | POA: Diagnosis not present

## 2016-08-19 DIAGNOSIS — N186 End stage renal disease: Secondary | ICD-10-CM | POA: Diagnosis not present

## 2016-08-19 DIAGNOSIS — E1122 Type 2 diabetes mellitus with diabetic chronic kidney disease: Secondary | ICD-10-CM | POA: Diagnosis not present

## 2016-08-19 DIAGNOSIS — Z23 Encounter for immunization: Secondary | ICD-10-CM | POA: Diagnosis not present

## 2016-08-19 DIAGNOSIS — N2581 Secondary hyperparathyroidism of renal origin: Secondary | ICD-10-CM | POA: Diagnosis not present

## 2016-08-19 DIAGNOSIS — D689 Coagulation defect, unspecified: Secondary | ICD-10-CM | POA: Diagnosis not present

## 2016-08-21 DIAGNOSIS — D689 Coagulation defect, unspecified: Secondary | ICD-10-CM | POA: Diagnosis not present

## 2016-08-21 DIAGNOSIS — N186 End stage renal disease: Secondary | ICD-10-CM | POA: Diagnosis not present

## 2016-08-21 DIAGNOSIS — E1122 Type 2 diabetes mellitus with diabetic chronic kidney disease: Secondary | ICD-10-CM | POA: Diagnosis not present

## 2016-08-21 DIAGNOSIS — Z23 Encounter for immunization: Secondary | ICD-10-CM | POA: Diagnosis not present

## 2016-08-21 DIAGNOSIS — N2581 Secondary hyperparathyroidism of renal origin: Secondary | ICD-10-CM | POA: Diagnosis not present

## 2016-08-21 DIAGNOSIS — D631 Anemia in chronic kidney disease: Secondary | ICD-10-CM | POA: Diagnosis not present

## 2016-08-23 DIAGNOSIS — D689 Coagulation defect, unspecified: Secondary | ICD-10-CM | POA: Diagnosis not present

## 2016-08-23 DIAGNOSIS — N186 End stage renal disease: Secondary | ICD-10-CM | POA: Diagnosis not present

## 2016-08-23 DIAGNOSIS — D631 Anemia in chronic kidney disease: Secondary | ICD-10-CM | POA: Diagnosis not present

## 2016-08-23 DIAGNOSIS — N2581 Secondary hyperparathyroidism of renal origin: Secondary | ICD-10-CM | POA: Diagnosis not present

## 2016-08-23 DIAGNOSIS — Z23 Encounter for immunization: Secondary | ICD-10-CM | POA: Diagnosis not present

## 2016-08-23 DIAGNOSIS — E1122 Type 2 diabetes mellitus with diabetic chronic kidney disease: Secondary | ICD-10-CM | POA: Diagnosis not present

## 2016-08-26 DIAGNOSIS — D689 Coagulation defect, unspecified: Secondary | ICD-10-CM | POA: Diagnosis not present

## 2016-08-26 DIAGNOSIS — E1122 Type 2 diabetes mellitus with diabetic chronic kidney disease: Secondary | ICD-10-CM | POA: Diagnosis not present

## 2016-08-26 DIAGNOSIS — N2581 Secondary hyperparathyroidism of renal origin: Secondary | ICD-10-CM | POA: Diagnosis not present

## 2016-08-26 DIAGNOSIS — D631 Anemia in chronic kidney disease: Secondary | ICD-10-CM | POA: Diagnosis not present

## 2016-08-26 DIAGNOSIS — N186 End stage renal disease: Secondary | ICD-10-CM | POA: Diagnosis not present

## 2016-08-26 DIAGNOSIS — Z23 Encounter for immunization: Secondary | ICD-10-CM | POA: Diagnosis not present

## 2016-08-28 DIAGNOSIS — D631 Anemia in chronic kidney disease: Secondary | ICD-10-CM | POA: Diagnosis not present

## 2016-08-28 DIAGNOSIS — D689 Coagulation defect, unspecified: Secondary | ICD-10-CM | POA: Diagnosis not present

## 2016-08-28 DIAGNOSIS — N2581 Secondary hyperparathyroidism of renal origin: Secondary | ICD-10-CM | POA: Diagnosis not present

## 2016-08-28 DIAGNOSIS — E1122 Type 2 diabetes mellitus with diabetic chronic kidney disease: Secondary | ICD-10-CM | POA: Diagnosis not present

## 2016-08-28 DIAGNOSIS — N186 End stage renal disease: Secondary | ICD-10-CM | POA: Diagnosis not present

## 2016-08-28 DIAGNOSIS — Z23 Encounter for immunization: Secondary | ICD-10-CM | POA: Diagnosis not present

## 2016-08-30 DIAGNOSIS — E1122 Type 2 diabetes mellitus with diabetic chronic kidney disease: Secondary | ICD-10-CM | POA: Diagnosis not present

## 2016-08-30 DIAGNOSIS — D689 Coagulation defect, unspecified: Secondary | ICD-10-CM | POA: Diagnosis not present

## 2016-08-30 DIAGNOSIS — Z992 Dependence on renal dialysis: Secondary | ICD-10-CM | POA: Diagnosis not present

## 2016-08-30 DIAGNOSIS — D631 Anemia in chronic kidney disease: Secondary | ICD-10-CM | POA: Diagnosis not present

## 2016-08-30 DIAGNOSIS — Z23 Encounter for immunization: Secondary | ICD-10-CM | POA: Diagnosis not present

## 2016-08-30 DIAGNOSIS — N186 End stage renal disease: Secondary | ICD-10-CM | POA: Diagnosis not present

## 2016-08-30 DIAGNOSIS — N2581 Secondary hyperparathyroidism of renal origin: Secondary | ICD-10-CM | POA: Diagnosis not present

## 2016-08-30 DIAGNOSIS — I129 Hypertensive chronic kidney disease with stage 1 through stage 4 chronic kidney disease, or unspecified chronic kidney disease: Secondary | ICD-10-CM | POA: Diagnosis not present

## 2016-09-02 DIAGNOSIS — N186 End stage renal disease: Secondary | ICD-10-CM | POA: Diagnosis not present

## 2016-09-02 DIAGNOSIS — N2581 Secondary hyperparathyroidism of renal origin: Secondary | ICD-10-CM | POA: Diagnosis not present

## 2016-09-02 DIAGNOSIS — D689 Coagulation defect, unspecified: Secondary | ICD-10-CM | POA: Diagnosis not present

## 2016-09-02 DIAGNOSIS — E1122 Type 2 diabetes mellitus with diabetic chronic kidney disease: Secondary | ICD-10-CM | POA: Diagnosis not present

## 2016-09-04 DIAGNOSIS — D689 Coagulation defect, unspecified: Secondary | ICD-10-CM | POA: Diagnosis not present

## 2016-09-04 DIAGNOSIS — N2581 Secondary hyperparathyroidism of renal origin: Secondary | ICD-10-CM | POA: Diagnosis not present

## 2016-09-04 DIAGNOSIS — E1122 Type 2 diabetes mellitus with diabetic chronic kidney disease: Secondary | ICD-10-CM | POA: Diagnosis not present

## 2016-09-04 DIAGNOSIS — N186 End stage renal disease: Secondary | ICD-10-CM | POA: Diagnosis not present

## 2016-09-06 DIAGNOSIS — E1122 Type 2 diabetes mellitus with diabetic chronic kidney disease: Secondary | ICD-10-CM | POA: Diagnosis not present

## 2016-09-06 DIAGNOSIS — D689 Coagulation defect, unspecified: Secondary | ICD-10-CM | POA: Diagnosis not present

## 2016-09-06 DIAGNOSIS — N186 End stage renal disease: Secondary | ICD-10-CM | POA: Diagnosis not present

## 2016-09-06 DIAGNOSIS — N2581 Secondary hyperparathyroidism of renal origin: Secondary | ICD-10-CM | POA: Diagnosis not present

## 2016-09-09 DIAGNOSIS — N186 End stage renal disease: Secondary | ICD-10-CM | POA: Diagnosis not present

## 2016-09-09 DIAGNOSIS — N2581 Secondary hyperparathyroidism of renal origin: Secondary | ICD-10-CM | POA: Diagnosis not present

## 2016-09-09 DIAGNOSIS — D689 Coagulation defect, unspecified: Secondary | ICD-10-CM | POA: Diagnosis not present

## 2016-09-09 DIAGNOSIS — E1122 Type 2 diabetes mellitus with diabetic chronic kidney disease: Secondary | ICD-10-CM | POA: Diagnosis not present

## 2016-09-11 DIAGNOSIS — D689 Coagulation defect, unspecified: Secondary | ICD-10-CM | POA: Diagnosis not present

## 2016-09-11 DIAGNOSIS — N186 End stage renal disease: Secondary | ICD-10-CM | POA: Diagnosis not present

## 2016-09-11 DIAGNOSIS — E1122 Type 2 diabetes mellitus with diabetic chronic kidney disease: Secondary | ICD-10-CM | POA: Diagnosis not present

## 2016-09-11 DIAGNOSIS — N2581 Secondary hyperparathyroidism of renal origin: Secondary | ICD-10-CM | POA: Diagnosis not present

## 2016-09-13 DIAGNOSIS — N2581 Secondary hyperparathyroidism of renal origin: Secondary | ICD-10-CM | POA: Diagnosis not present

## 2016-09-13 DIAGNOSIS — N186 End stage renal disease: Secondary | ICD-10-CM | POA: Diagnosis not present

## 2016-09-13 DIAGNOSIS — E1122 Type 2 diabetes mellitus with diabetic chronic kidney disease: Secondary | ICD-10-CM | POA: Diagnosis not present

## 2016-09-13 DIAGNOSIS — D689 Coagulation defect, unspecified: Secondary | ICD-10-CM | POA: Diagnosis not present

## 2016-09-16 DIAGNOSIS — N2581 Secondary hyperparathyroidism of renal origin: Secondary | ICD-10-CM | POA: Diagnosis not present

## 2016-09-16 DIAGNOSIS — N186 End stage renal disease: Secondary | ICD-10-CM | POA: Diagnosis not present

## 2016-09-16 DIAGNOSIS — E1122 Type 2 diabetes mellitus with diabetic chronic kidney disease: Secondary | ICD-10-CM | POA: Diagnosis not present

## 2016-09-16 DIAGNOSIS — D689 Coagulation defect, unspecified: Secondary | ICD-10-CM | POA: Diagnosis not present

## 2016-09-18 DIAGNOSIS — D689 Coagulation defect, unspecified: Secondary | ICD-10-CM | POA: Diagnosis not present

## 2016-09-18 DIAGNOSIS — E1122 Type 2 diabetes mellitus with diabetic chronic kidney disease: Secondary | ICD-10-CM | POA: Diagnosis not present

## 2016-09-18 DIAGNOSIS — N186 End stage renal disease: Secondary | ICD-10-CM | POA: Diagnosis not present

## 2016-09-18 DIAGNOSIS — N2581 Secondary hyperparathyroidism of renal origin: Secondary | ICD-10-CM | POA: Diagnosis not present

## 2016-09-20 DIAGNOSIS — E1122 Type 2 diabetes mellitus with diabetic chronic kidney disease: Secondary | ICD-10-CM | POA: Diagnosis not present

## 2016-09-20 DIAGNOSIS — D689 Coagulation defect, unspecified: Secondary | ICD-10-CM | POA: Diagnosis not present

## 2016-09-20 DIAGNOSIS — N2581 Secondary hyperparathyroidism of renal origin: Secondary | ICD-10-CM | POA: Diagnosis not present

## 2016-09-20 DIAGNOSIS — N186 End stage renal disease: Secondary | ICD-10-CM | POA: Diagnosis not present

## 2016-09-23 DIAGNOSIS — E1122 Type 2 diabetes mellitus with diabetic chronic kidney disease: Secondary | ICD-10-CM | POA: Diagnosis not present

## 2016-09-23 DIAGNOSIS — D689 Coagulation defect, unspecified: Secondary | ICD-10-CM | POA: Diagnosis not present

## 2016-09-23 DIAGNOSIS — N2581 Secondary hyperparathyroidism of renal origin: Secondary | ICD-10-CM | POA: Diagnosis not present

## 2016-09-23 DIAGNOSIS — N186 End stage renal disease: Secondary | ICD-10-CM | POA: Diagnosis not present

## 2016-09-25 DIAGNOSIS — D689 Coagulation defect, unspecified: Secondary | ICD-10-CM | POA: Diagnosis not present

## 2016-09-25 DIAGNOSIS — N2581 Secondary hyperparathyroidism of renal origin: Secondary | ICD-10-CM | POA: Diagnosis not present

## 2016-09-25 DIAGNOSIS — N186 End stage renal disease: Secondary | ICD-10-CM | POA: Diagnosis not present

## 2016-09-25 DIAGNOSIS — E1122 Type 2 diabetes mellitus with diabetic chronic kidney disease: Secondary | ICD-10-CM | POA: Diagnosis not present

## 2016-09-27 DIAGNOSIS — N186 End stage renal disease: Secondary | ICD-10-CM | POA: Diagnosis not present

## 2016-09-27 DIAGNOSIS — D689 Coagulation defect, unspecified: Secondary | ICD-10-CM | POA: Diagnosis not present

## 2016-09-27 DIAGNOSIS — E1122 Type 2 diabetes mellitus with diabetic chronic kidney disease: Secondary | ICD-10-CM | POA: Diagnosis not present

## 2016-09-27 DIAGNOSIS — N2581 Secondary hyperparathyroidism of renal origin: Secondary | ICD-10-CM | POA: Diagnosis not present

## 2016-09-29 DIAGNOSIS — Z992 Dependence on renal dialysis: Secondary | ICD-10-CM | POA: Diagnosis not present

## 2016-09-29 DIAGNOSIS — I129 Hypertensive chronic kidney disease with stage 1 through stage 4 chronic kidney disease, or unspecified chronic kidney disease: Secondary | ICD-10-CM | POA: Diagnosis not present

## 2016-09-29 DIAGNOSIS — N186 End stage renal disease: Secondary | ICD-10-CM | POA: Diagnosis not present

## 2016-09-30 DIAGNOSIS — D689 Coagulation defect, unspecified: Secondary | ICD-10-CM | POA: Diagnosis not present

## 2016-09-30 DIAGNOSIS — N186 End stage renal disease: Secondary | ICD-10-CM | POA: Diagnosis not present

## 2016-09-30 DIAGNOSIS — E1122 Type 2 diabetes mellitus with diabetic chronic kidney disease: Secondary | ICD-10-CM | POA: Diagnosis not present

## 2016-09-30 DIAGNOSIS — N2581 Secondary hyperparathyroidism of renal origin: Secondary | ICD-10-CM | POA: Diagnosis not present

## 2016-09-30 DIAGNOSIS — D631 Anemia in chronic kidney disease: Secondary | ICD-10-CM | POA: Diagnosis not present

## 2016-10-01 DIAGNOSIS — N186 End stage renal disease: Secondary | ICD-10-CM | POA: Diagnosis not present

## 2016-10-01 DIAGNOSIS — D689 Coagulation defect, unspecified: Secondary | ICD-10-CM | POA: Diagnosis not present

## 2016-10-01 DIAGNOSIS — N2581 Secondary hyperparathyroidism of renal origin: Secondary | ICD-10-CM | POA: Diagnosis not present

## 2016-10-01 DIAGNOSIS — E1122 Type 2 diabetes mellitus with diabetic chronic kidney disease: Secondary | ICD-10-CM | POA: Diagnosis not present

## 2016-10-01 DIAGNOSIS — D631 Anemia in chronic kidney disease: Secondary | ICD-10-CM | POA: Diagnosis not present

## 2016-10-02 DIAGNOSIS — N186 End stage renal disease: Secondary | ICD-10-CM | POA: Diagnosis not present

## 2016-10-02 DIAGNOSIS — N2581 Secondary hyperparathyroidism of renal origin: Secondary | ICD-10-CM | POA: Diagnosis not present

## 2016-10-02 DIAGNOSIS — D689 Coagulation defect, unspecified: Secondary | ICD-10-CM | POA: Diagnosis not present

## 2016-10-02 DIAGNOSIS — D631 Anemia in chronic kidney disease: Secondary | ICD-10-CM | POA: Diagnosis not present

## 2016-10-02 DIAGNOSIS — E1122 Type 2 diabetes mellitus with diabetic chronic kidney disease: Secondary | ICD-10-CM | POA: Diagnosis not present

## 2016-10-04 DIAGNOSIS — D631 Anemia in chronic kidney disease: Secondary | ICD-10-CM | POA: Diagnosis not present

## 2016-10-04 DIAGNOSIS — E1122 Type 2 diabetes mellitus with diabetic chronic kidney disease: Secondary | ICD-10-CM | POA: Diagnosis not present

## 2016-10-04 DIAGNOSIS — D689 Coagulation defect, unspecified: Secondary | ICD-10-CM | POA: Diagnosis not present

## 2016-10-04 DIAGNOSIS — N2581 Secondary hyperparathyroidism of renal origin: Secondary | ICD-10-CM | POA: Diagnosis not present

## 2016-10-04 DIAGNOSIS — N186 End stage renal disease: Secondary | ICD-10-CM | POA: Diagnosis not present

## 2016-10-07 DIAGNOSIS — D689 Coagulation defect, unspecified: Secondary | ICD-10-CM | POA: Diagnosis not present

## 2016-10-07 DIAGNOSIS — N186 End stage renal disease: Secondary | ICD-10-CM | POA: Diagnosis not present

## 2016-10-07 DIAGNOSIS — N2581 Secondary hyperparathyroidism of renal origin: Secondary | ICD-10-CM | POA: Diagnosis not present

## 2016-10-07 DIAGNOSIS — E1122 Type 2 diabetes mellitus with diabetic chronic kidney disease: Secondary | ICD-10-CM | POA: Diagnosis not present

## 2016-10-07 DIAGNOSIS — D631 Anemia in chronic kidney disease: Secondary | ICD-10-CM | POA: Diagnosis not present

## 2016-10-09 DIAGNOSIS — D631 Anemia in chronic kidney disease: Secondary | ICD-10-CM | POA: Diagnosis not present

## 2016-10-09 DIAGNOSIS — N2581 Secondary hyperparathyroidism of renal origin: Secondary | ICD-10-CM | POA: Diagnosis not present

## 2016-10-09 DIAGNOSIS — E1122 Type 2 diabetes mellitus with diabetic chronic kidney disease: Secondary | ICD-10-CM | POA: Diagnosis not present

## 2016-10-09 DIAGNOSIS — N186 End stage renal disease: Secondary | ICD-10-CM | POA: Diagnosis not present

## 2016-10-09 DIAGNOSIS — D689 Coagulation defect, unspecified: Secondary | ICD-10-CM | POA: Diagnosis not present

## 2016-10-11 DIAGNOSIS — N2581 Secondary hyperparathyroidism of renal origin: Secondary | ICD-10-CM | POA: Diagnosis not present

## 2016-10-11 DIAGNOSIS — E1122 Type 2 diabetes mellitus with diabetic chronic kidney disease: Secondary | ICD-10-CM | POA: Diagnosis not present

## 2016-10-11 DIAGNOSIS — D689 Coagulation defect, unspecified: Secondary | ICD-10-CM | POA: Diagnosis not present

## 2016-10-11 DIAGNOSIS — N186 End stage renal disease: Secondary | ICD-10-CM | POA: Diagnosis not present

## 2016-10-11 DIAGNOSIS — D631 Anemia in chronic kidney disease: Secondary | ICD-10-CM | POA: Diagnosis not present

## 2016-10-14 DIAGNOSIS — N186 End stage renal disease: Secondary | ICD-10-CM | POA: Diagnosis not present

## 2016-10-14 DIAGNOSIS — E1122 Type 2 diabetes mellitus with diabetic chronic kidney disease: Secondary | ICD-10-CM | POA: Diagnosis not present

## 2016-10-14 DIAGNOSIS — N2581 Secondary hyperparathyroidism of renal origin: Secondary | ICD-10-CM | POA: Diagnosis not present

## 2016-10-14 DIAGNOSIS — D631 Anemia in chronic kidney disease: Secondary | ICD-10-CM | POA: Diagnosis not present

## 2016-10-14 DIAGNOSIS — D689 Coagulation defect, unspecified: Secondary | ICD-10-CM | POA: Diagnosis not present

## 2016-10-16 DIAGNOSIS — N2581 Secondary hyperparathyroidism of renal origin: Secondary | ICD-10-CM | POA: Diagnosis not present

## 2016-10-16 DIAGNOSIS — E1122 Type 2 diabetes mellitus with diabetic chronic kidney disease: Secondary | ICD-10-CM | POA: Diagnosis not present

## 2016-10-16 DIAGNOSIS — D689 Coagulation defect, unspecified: Secondary | ICD-10-CM | POA: Diagnosis not present

## 2016-10-16 DIAGNOSIS — N186 End stage renal disease: Secondary | ICD-10-CM | POA: Diagnosis not present

## 2016-10-16 DIAGNOSIS — D631 Anemia in chronic kidney disease: Secondary | ICD-10-CM | POA: Diagnosis not present

## 2016-10-18 DIAGNOSIS — E1122 Type 2 diabetes mellitus with diabetic chronic kidney disease: Secondary | ICD-10-CM | POA: Diagnosis not present

## 2016-10-18 DIAGNOSIS — D631 Anemia in chronic kidney disease: Secondary | ICD-10-CM | POA: Diagnosis not present

## 2016-10-18 DIAGNOSIS — N2581 Secondary hyperparathyroidism of renal origin: Secondary | ICD-10-CM | POA: Diagnosis not present

## 2016-10-18 DIAGNOSIS — D689 Coagulation defect, unspecified: Secondary | ICD-10-CM | POA: Diagnosis not present

## 2016-10-18 DIAGNOSIS — N186 End stage renal disease: Secondary | ICD-10-CM | POA: Diagnosis not present

## 2016-10-21 DIAGNOSIS — N186 End stage renal disease: Secondary | ICD-10-CM | POA: Diagnosis not present

## 2016-10-21 DIAGNOSIS — D631 Anemia in chronic kidney disease: Secondary | ICD-10-CM | POA: Diagnosis not present

## 2016-10-21 DIAGNOSIS — N2581 Secondary hyperparathyroidism of renal origin: Secondary | ICD-10-CM | POA: Diagnosis not present

## 2016-10-21 DIAGNOSIS — E1122 Type 2 diabetes mellitus with diabetic chronic kidney disease: Secondary | ICD-10-CM | POA: Diagnosis not present

## 2016-10-21 DIAGNOSIS — D689 Coagulation defect, unspecified: Secondary | ICD-10-CM | POA: Diagnosis not present

## 2016-10-23 DIAGNOSIS — D631 Anemia in chronic kidney disease: Secondary | ICD-10-CM | POA: Diagnosis not present

## 2016-10-23 DIAGNOSIS — E1122 Type 2 diabetes mellitus with diabetic chronic kidney disease: Secondary | ICD-10-CM | POA: Diagnosis not present

## 2016-10-23 DIAGNOSIS — D689 Coagulation defect, unspecified: Secondary | ICD-10-CM | POA: Diagnosis not present

## 2016-10-23 DIAGNOSIS — N2581 Secondary hyperparathyroidism of renal origin: Secondary | ICD-10-CM | POA: Diagnosis not present

## 2016-10-23 DIAGNOSIS — N186 End stage renal disease: Secondary | ICD-10-CM | POA: Diagnosis not present

## 2016-10-25 DIAGNOSIS — E1122 Type 2 diabetes mellitus with diabetic chronic kidney disease: Secondary | ICD-10-CM | POA: Diagnosis not present

## 2016-10-25 DIAGNOSIS — D689 Coagulation defect, unspecified: Secondary | ICD-10-CM | POA: Diagnosis not present

## 2016-10-25 DIAGNOSIS — D631 Anemia in chronic kidney disease: Secondary | ICD-10-CM | POA: Diagnosis not present

## 2016-10-25 DIAGNOSIS — N2581 Secondary hyperparathyroidism of renal origin: Secondary | ICD-10-CM | POA: Diagnosis not present

## 2016-10-25 DIAGNOSIS — N186 End stage renal disease: Secondary | ICD-10-CM | POA: Diagnosis not present

## 2016-10-28 DIAGNOSIS — D631 Anemia in chronic kidney disease: Secondary | ICD-10-CM | POA: Diagnosis not present

## 2016-10-28 DIAGNOSIS — N186 End stage renal disease: Secondary | ICD-10-CM | POA: Diagnosis not present

## 2016-10-28 DIAGNOSIS — D689 Coagulation defect, unspecified: Secondary | ICD-10-CM | POA: Diagnosis not present

## 2016-10-28 DIAGNOSIS — N2581 Secondary hyperparathyroidism of renal origin: Secondary | ICD-10-CM | POA: Diagnosis not present

## 2016-10-28 DIAGNOSIS — E1122 Type 2 diabetes mellitus with diabetic chronic kidney disease: Secondary | ICD-10-CM | POA: Diagnosis not present

## 2016-10-30 DIAGNOSIS — N2581 Secondary hyperparathyroidism of renal origin: Secondary | ICD-10-CM | POA: Diagnosis not present

## 2016-10-30 DIAGNOSIS — D689 Coagulation defect, unspecified: Secondary | ICD-10-CM | POA: Diagnosis not present

## 2016-10-30 DIAGNOSIS — Z992 Dependence on renal dialysis: Secondary | ICD-10-CM | POA: Diagnosis not present

## 2016-10-30 DIAGNOSIS — E1122 Type 2 diabetes mellitus with diabetic chronic kidney disease: Secondary | ICD-10-CM | POA: Diagnosis not present

## 2016-10-30 DIAGNOSIS — I129 Hypertensive chronic kidney disease with stage 1 through stage 4 chronic kidney disease, or unspecified chronic kidney disease: Secondary | ICD-10-CM | POA: Diagnosis not present

## 2016-10-30 DIAGNOSIS — D631 Anemia in chronic kidney disease: Secondary | ICD-10-CM | POA: Diagnosis not present

## 2016-10-30 DIAGNOSIS — N186 End stage renal disease: Secondary | ICD-10-CM | POA: Diagnosis not present

## 2016-11-01 DIAGNOSIS — D689 Coagulation defect, unspecified: Secondary | ICD-10-CM | POA: Diagnosis not present

## 2016-11-01 DIAGNOSIS — E1122 Type 2 diabetes mellitus with diabetic chronic kidney disease: Secondary | ICD-10-CM | POA: Diagnosis not present

## 2016-11-01 DIAGNOSIS — N2581 Secondary hyperparathyroidism of renal origin: Secondary | ICD-10-CM | POA: Diagnosis not present

## 2016-11-01 DIAGNOSIS — N186 End stage renal disease: Secondary | ICD-10-CM | POA: Diagnosis not present

## 2016-11-04 DIAGNOSIS — D689 Coagulation defect, unspecified: Secondary | ICD-10-CM | POA: Diagnosis not present

## 2016-11-04 DIAGNOSIS — E1122 Type 2 diabetes mellitus with diabetic chronic kidney disease: Secondary | ICD-10-CM | POA: Diagnosis not present

## 2016-11-04 DIAGNOSIS — N2581 Secondary hyperparathyroidism of renal origin: Secondary | ICD-10-CM | POA: Diagnosis not present

## 2016-11-04 DIAGNOSIS — N186 End stage renal disease: Secondary | ICD-10-CM | POA: Diagnosis not present

## 2016-11-05 DIAGNOSIS — E118 Type 2 diabetes mellitus with unspecified complications: Secondary | ICD-10-CM | POA: Diagnosis not present

## 2016-11-05 DIAGNOSIS — N186 End stage renal disease: Secondary | ICD-10-CM | POA: Diagnosis not present

## 2016-11-05 DIAGNOSIS — E1122 Type 2 diabetes mellitus with diabetic chronic kidney disease: Secondary | ICD-10-CM | POA: Diagnosis not present

## 2016-11-05 DIAGNOSIS — D689 Coagulation defect, unspecified: Secondary | ICD-10-CM | POA: Diagnosis not present

## 2016-11-05 DIAGNOSIS — G4730 Sleep apnea, unspecified: Secondary | ICD-10-CM | POA: Diagnosis not present

## 2016-11-05 DIAGNOSIS — N2581 Secondary hyperparathyroidism of renal origin: Secondary | ICD-10-CM | POA: Diagnosis not present

## 2016-11-05 DIAGNOSIS — E789 Disorder of lipoprotein metabolism, unspecified: Secondary | ICD-10-CM | POA: Diagnosis not present

## 2016-11-06 DIAGNOSIS — N186 End stage renal disease: Secondary | ICD-10-CM | POA: Diagnosis not present

## 2016-11-06 DIAGNOSIS — E1122 Type 2 diabetes mellitus with diabetic chronic kidney disease: Secondary | ICD-10-CM | POA: Diagnosis not present

## 2016-11-06 DIAGNOSIS — N2581 Secondary hyperparathyroidism of renal origin: Secondary | ICD-10-CM | POA: Diagnosis not present

## 2016-11-06 DIAGNOSIS — D689 Coagulation defect, unspecified: Secondary | ICD-10-CM | POA: Diagnosis not present

## 2016-11-07 ENCOUNTER — Telehealth: Payer: Self-pay | Admitting: Cardiovascular Disease

## 2016-11-07 NOTE — Telephone Encounter (Signed)
11/07/2016 Received faxed referral packet from [facility] for upcoming appointment with Dr. Claiborne Billings on 11/28/2016.  Records given to [facility].  cbr

## 2016-11-08 DIAGNOSIS — N186 End stage renal disease: Secondary | ICD-10-CM | POA: Diagnosis not present

## 2016-11-08 DIAGNOSIS — D689 Coagulation defect, unspecified: Secondary | ICD-10-CM | POA: Diagnosis not present

## 2016-11-08 DIAGNOSIS — E1122 Type 2 diabetes mellitus with diabetic chronic kidney disease: Secondary | ICD-10-CM | POA: Diagnosis not present

## 2016-11-08 DIAGNOSIS — N2581 Secondary hyperparathyroidism of renal origin: Secondary | ICD-10-CM | POA: Diagnosis not present

## 2016-11-10 ENCOUNTER — Other Ambulatory Visit: Payer: Self-pay | Admitting: Vascular Surgery

## 2016-11-10 DIAGNOSIS — I739 Peripheral vascular disease, unspecified: Secondary | ICD-10-CM

## 2016-11-11 DIAGNOSIS — D689 Coagulation defect, unspecified: Secondary | ICD-10-CM | POA: Diagnosis not present

## 2016-11-11 DIAGNOSIS — N186 End stage renal disease: Secondary | ICD-10-CM | POA: Diagnosis not present

## 2016-11-11 DIAGNOSIS — E1122 Type 2 diabetes mellitus with diabetic chronic kidney disease: Secondary | ICD-10-CM | POA: Diagnosis not present

## 2016-11-11 DIAGNOSIS — N2581 Secondary hyperparathyroidism of renal origin: Secondary | ICD-10-CM | POA: Diagnosis not present

## 2016-11-12 DIAGNOSIS — E118 Type 2 diabetes mellitus with unspecified complications: Secondary | ICD-10-CM | POA: Diagnosis not present

## 2016-11-12 DIAGNOSIS — I10 Essential (primary) hypertension: Secondary | ICD-10-CM | POA: Diagnosis not present

## 2016-11-12 DIAGNOSIS — M109 Gout, unspecified: Secondary | ICD-10-CM | POA: Diagnosis not present

## 2016-11-12 DIAGNOSIS — N189 Chronic kidney disease, unspecified: Secondary | ICD-10-CM | POA: Diagnosis not present

## 2016-11-13 DIAGNOSIS — D689 Coagulation defect, unspecified: Secondary | ICD-10-CM | POA: Diagnosis not present

## 2016-11-13 DIAGNOSIS — E1122 Type 2 diabetes mellitus with diabetic chronic kidney disease: Secondary | ICD-10-CM | POA: Diagnosis not present

## 2016-11-13 DIAGNOSIS — N186 End stage renal disease: Secondary | ICD-10-CM | POA: Diagnosis not present

## 2016-11-13 DIAGNOSIS — N2581 Secondary hyperparathyroidism of renal origin: Secondary | ICD-10-CM | POA: Diagnosis not present

## 2016-11-15 DIAGNOSIS — N186 End stage renal disease: Secondary | ICD-10-CM | POA: Diagnosis not present

## 2016-11-15 DIAGNOSIS — N2581 Secondary hyperparathyroidism of renal origin: Secondary | ICD-10-CM | POA: Diagnosis not present

## 2016-11-15 DIAGNOSIS — E1122 Type 2 diabetes mellitus with diabetic chronic kidney disease: Secondary | ICD-10-CM | POA: Diagnosis not present

## 2016-11-15 DIAGNOSIS — D689 Coagulation defect, unspecified: Secondary | ICD-10-CM | POA: Diagnosis not present

## 2016-11-18 DIAGNOSIS — N2581 Secondary hyperparathyroidism of renal origin: Secondary | ICD-10-CM | POA: Diagnosis not present

## 2016-11-18 DIAGNOSIS — D689 Coagulation defect, unspecified: Secondary | ICD-10-CM | POA: Diagnosis not present

## 2016-11-18 DIAGNOSIS — N186 End stage renal disease: Secondary | ICD-10-CM | POA: Diagnosis not present

## 2016-11-18 DIAGNOSIS — E1122 Type 2 diabetes mellitus with diabetic chronic kidney disease: Secondary | ICD-10-CM | POA: Diagnosis not present

## 2016-11-20 DIAGNOSIS — D689 Coagulation defect, unspecified: Secondary | ICD-10-CM | POA: Diagnosis not present

## 2016-11-20 DIAGNOSIS — N186 End stage renal disease: Secondary | ICD-10-CM | POA: Diagnosis not present

## 2016-11-20 DIAGNOSIS — E1122 Type 2 diabetes mellitus with diabetic chronic kidney disease: Secondary | ICD-10-CM | POA: Diagnosis not present

## 2016-11-20 DIAGNOSIS — N2581 Secondary hyperparathyroidism of renal origin: Secondary | ICD-10-CM | POA: Diagnosis not present

## 2016-11-22 DIAGNOSIS — N186 End stage renal disease: Secondary | ICD-10-CM | POA: Diagnosis not present

## 2016-11-22 DIAGNOSIS — N2581 Secondary hyperparathyroidism of renal origin: Secondary | ICD-10-CM | POA: Diagnosis not present

## 2016-11-22 DIAGNOSIS — D689 Coagulation defect, unspecified: Secondary | ICD-10-CM | POA: Diagnosis not present

## 2016-11-22 DIAGNOSIS — E1122 Type 2 diabetes mellitus with diabetic chronic kidney disease: Secondary | ICD-10-CM | POA: Diagnosis not present

## 2016-11-25 DIAGNOSIS — E1122 Type 2 diabetes mellitus with diabetic chronic kidney disease: Secondary | ICD-10-CM | POA: Diagnosis not present

## 2016-11-25 DIAGNOSIS — N186 End stage renal disease: Secondary | ICD-10-CM | POA: Diagnosis not present

## 2016-11-25 DIAGNOSIS — N2581 Secondary hyperparathyroidism of renal origin: Secondary | ICD-10-CM | POA: Diagnosis not present

## 2016-11-25 DIAGNOSIS — D689 Coagulation defect, unspecified: Secondary | ICD-10-CM | POA: Diagnosis not present

## 2016-11-27 DIAGNOSIS — N2581 Secondary hyperparathyroidism of renal origin: Secondary | ICD-10-CM | POA: Diagnosis not present

## 2016-11-27 DIAGNOSIS — E1122 Type 2 diabetes mellitus with diabetic chronic kidney disease: Secondary | ICD-10-CM | POA: Diagnosis not present

## 2016-11-27 DIAGNOSIS — N186 End stage renal disease: Secondary | ICD-10-CM | POA: Diagnosis not present

## 2016-11-27 DIAGNOSIS — D689 Coagulation defect, unspecified: Secondary | ICD-10-CM | POA: Diagnosis not present

## 2016-11-28 ENCOUNTER — Encounter: Payer: Self-pay | Admitting: Cardiovascular Disease

## 2016-11-28 ENCOUNTER — Ambulatory Visit (INDEPENDENT_AMBULATORY_CARE_PROVIDER_SITE_OTHER): Payer: Medicare Other | Admitting: Cardiovascular Disease

## 2016-11-28 VITALS — BP 106/78 | HR 105 | Ht 66.0 in | Wt 347.0 lb

## 2016-11-28 DIAGNOSIS — E785 Hyperlipidemia, unspecified: Secondary | ICD-10-CM | POA: Diagnosis not present

## 2016-11-28 DIAGNOSIS — Z992 Dependence on renal dialysis: Secondary | ICD-10-CM

## 2016-11-28 DIAGNOSIS — I10 Essential (primary) hypertension: Secondary | ICD-10-CM | POA: Diagnosis not present

## 2016-11-28 DIAGNOSIS — R000 Tachycardia, unspecified: Secondary | ICD-10-CM

## 2016-11-28 DIAGNOSIS — N186 End stage renal disease: Secondary | ICD-10-CM | POA: Diagnosis not present

## 2016-11-28 DIAGNOSIS — G4733 Obstructive sleep apnea (adult) (pediatric): Secondary | ICD-10-CM

## 2016-11-28 DIAGNOSIS — Z9989 Dependence on other enabling machines and devices: Secondary | ICD-10-CM

## 2016-11-28 NOTE — Progress Notes (Signed)
Cardiology Office Note    Date:  11/30/2016   ID:  [name], DOB Nov 27, 1955, MRN [id number]  PCP:  Laura Kraft, MD  Cardiologist:  Laura Majestic, MD   Chief Complaint  Patient presents with  . Follow-up    History of Present Illness:  [name] is a 61 y.o. female who referred by [name] for me to assume care of her obstructive sleep apnea.  She had establish cardiology care with me in March 2018.  [name] was seen by [name] in May 2017.  She has a history of hypertension, hyperlipidemia, obstructive sleep apnea, stage IV renal disease and was not yet on dialysis.  She has had heart failure symptoms and volume overload.  She was admitted to [facility] hospital in May 2017 for acute on chronic heart failure in the setting of worsening renal failure.  During that hospitalization, troponins were elevated.  An echo Doppler study showed an EF of 55-60%, severe LVH, and mild [name] hypertension.  She was started on hemodialysis and required AV fistula revision.  She was transfused due to worsening edema and ultimately was hospitalized for close to one month and later discharged to skilled nursing facility.  She was seen in follow-up in the office and was tachycardic.  She was last seen in August 2017 by Laura Main, [name] and was having some mild dizziness after dialysis. She was instructed to hold the night dose of Toprol prior to her dialysis session.  The patient has requested establishment of care with me and presents for evaluation.  She denies any episodes of chest tightness.  She denies recent episodes of dizziness.  Her volume overload has resolved with implementation of dialysis.  She has a history of obstructive sleep apnea and was followed remotely at [facility] by [name].  She had undergone a sleep study at [place] in 2009.  Overall AHI was 12.8, but she had severe sleep apnea with rim sleep at 43.9.  Remotely, she had been set at a 14 cm pressure and  with [facility] her [facility].  Her last download was in 2012.  She had not been using CPAP, regular labor recently has increased usage.  Without CPAP therapy.  She would wake up every 2 hours.  Her sleep has improved with reinstitution of treatment.  She typically goes to bed at 11 PM and wakes up at 7 AM.  She is followed by [name] for renal disease.  She continues to have excessive daytime sleepiness and an Epworth Sleepiness Scale score endorsed at 13 today as shown below.  Epworth Sleepiness Scale: Situation   Chance of Dozing/Sleeping (0 = never , 1 = slight chance , 2 = moderate chance , 3 = high chance )   sitting and reading 2   watching TV 3   sitting inactive in a public place 1   being a passenger in a motor vehicle for an hour or more 1   lying down in the afternoon 2   sitting and talking to someone 1   sitting quietly after lunch (no alcohol) 2   while stopped for a few minutes in traffic as the driver 1   Total Score  13   She presents for evaluation.  Past Medical History:  Diagnosis Date  . Arthritis   . Chronic diastolic CHF (congestive heart failure) ([place])    a. Echo 5/17: severe LVH, EF 55-60%, mild AS, mean AV gradient  10 mmHg, mod LAE, PASP 37 mmHg  . Diabetes mellitus without complication ([place])    type 2  . ESRD (end stage renal disease) ([facility])    Tues, Thurs, Sat dialysis  . H/O blood clots    many years ago in leg  . History of pneumonia   . HLD (hyperlipidemia)   . Hypertension    BP low after dialysis started, no further BP meds  . Seizures ([place])    had one or two (more than 10-15 years ago)  . Sleep apnea    uses c-pap most of time    Past Surgical History:  Procedure Laterality Date  . ABDOMINAL HYSTERECTOMY    . AV FISTULA PLACEMENT Left 07/25/2015   Procedure: ARTERIOVENOUS (AV) FISTULA CREATION -LEFT BRACHIOCEPHALIC;  Surgeon: Laura Wenonah, MD;  Location: [place];  Service: Vascular;  Laterality: Left;  . COLONOSCOPY    . FISTULA  SUPERFICIALIZATION Left 11/01/2015   Procedure: SUPERFICIALIZATION OF LEFT ARM BRACHIOCEPHALIC FISTULA ;  Surgeon: Laura Montrose, MD;  Location: [place];  Service: Vascular;  Laterality: Left;  . INSERTION OF DIALYSIS CATHETER Right 10/09/2015   Procedure: INSERTION OF DIALYSIS CATHETER RIGHT INTERNAL JUGULAR ;  Surgeon: Laura Mould, MD;  Location[facility] OR;  Service: Vascular;  Laterality: Right;    Current Medications: Outpatient Medications Prior to Visit  Medication Sig Dispense Refill  . aspirin 81 MG tablet Take 81 mg by mouth daily.    Marland Kitchen atorvastatin (LIPITOR) 40 MG tablet Take 40 mg by mouth daily.    . cinacalcet (SENSIPAR) 60 MG tablet Take 60 mg by mouth daily.    Marland Kitchen docusate sodium (COLACE) 50 MG capsule Take 50 mg by mouth 2 (two) times daily.    . febuxostat (ULORIC) 40 MG tablet Take 40 mg by mouth daily.    Marland Kitchen FOSRENOL 1000 MG chewable tablet Chew 1,000 mg by mouth 3 (three) times daily with meals.    . metoprolol succinate (TOPROL-XL) 50 MG 24 hr tablet Take 50 mg by mouth daily. DO NOT TAKE THE NIGHT PRIOR TO YOUR DIALYSIS  0  . multivitamin (RENA-VIT) TABS tablet Take 1 tablet by mouth daily. 30 each 0  . ONETOUCH VERIO test strip TEST 2 TIMES A DAY AND AS NEEDED FOR BLOOD SUGAR  0  . senna-docusate (SENOKOT-S) 8.6-50 MG tablet Take 2 tablets by mouth 2 (two) times daily. 120 tablet 0   No facility-administered medications prior to visit.      Allergies:   Morphine and related   Social History   Social History  . Marital status: Single    Spouse name: N/A  . Number of children: N/A  . Years of education: N/A   Social History Horn Topics  . Smoking status: Never Smoker  . Smokeless tobacco: Never Used  . Alcohol use No  . Drug use: No  . Sexual activity: Not Asked   Other Topics Concern  . None   Social History Narrative   epworth sleepiness scale score 12    I have taken care of her family member, [name]  Family History:  The patient's family  history includes Cancer in her mother; Heart attack in her paternal aunt and sister; Hypertension in her brother, brother, father, sister, sister, sister, and sister.   ROS General: Negative; No fevers, chills, or night sweats;  HEENT: Negative; No changes in vision or hearing, sinus congestion, difficulty swallowing Pulmonary: Negative; No cough, wheezing, shortness of breath, hemoptysis Cardiovascular: Negative;  No chest pain, presyncope, syncope, palpitations GI: Negative; No nausea, vomiting, diarrhea, or abdominal pain GU: End-stage renal disease, now on dialysis on Tuesdays, Thursdays and Saturdays Musculoskeletal: Negative; no myalgias, joint pain, or weakness Hematologic/Oncology: Negative; no easy bruising, bleeding Endocrine: Positive for diabetes mellitus Neuro: Negative; no changes in balance, headaches Skin: Negative; No rashes or skin lesions Psychiatric: Negative; No behavioral problems, depression Sleep: Positive for OSA documented in 2009.  She had not been using CPAP but has recently restarted using therapy, daytime sleepiness, hypersomnolence;  no bruxism, restless legs, hypnogognic hallucinations, no cataplexy Other comprehensive 14 point system review is negative.   PHYSICAL EXAM:   VS:  BP 106/78   Pulse (!) 105   Ht _0  (1.676 m)   Wt (!) 347 lb (157.4 kg)   BMI 56.01 kg/m     Wt Readings from Last 3 Encounters:  11/28/16 (!) 347 lb (157.4 kg)  08/04/16 (!) 342 lb (155.1 kg)  07/03/16 (!) 343 lb (155.6 kg)     Physical Exam BP 106/78   Pulse (!) 105   Ht _1  (1.676 m)   Wt (!) 347 lb (157.4 kg)   BMI 56.01 kg/m  General: Alert, oriented, no distress.  Skin: normal turgor, no rashes, warm and dry HEENT: Normocephalic, atraumatic. Pupils equal round and reactive to light; sclera anicteric; extraocular muscles intact; Fundi Previously noted to have arterial narrowing Nose without nasal septal hypertrophy Mouth/Parynx benign; Mallinpatti scale  3 Neck: No JVD, no carotid bruits; normal carotid upstroke Lungs: clear to ausculatation and percussion; no wheezing or rales Chest wall: without tenderness to palpitation Heart: PMI not displaced, RRR, s1 s2 normal, 1/6 systolic murmur, no diastolic murmur, no rubs, gallops, thrills, or heaves Abdomen: soft, nontender; no hepatosplenomehaly, BS+; abdominal aorta nontender and not dilated by palpation. Back: no CVA tenderness Pulses 2+; AV fistula with palpable thrill in the left upper extremity Musculoskeletal: full range of motion, normal strength, no joint deformities Extremities: no clubbing cyanosis or edema, Homan's sign negative  Neurologic: grossly nonfocal; Cranial nerves grossly wnl Psychologic: Normal mood and affect    Studies/Labs Reviewed:   EKG:  EKG is ordered today. Sinus tachycardia 105 bpm.  QTc interval 460 Laura.  Q wave in lead 3.  PR interval 182 Laura.  March 2018 ECG (independently read by me): Normal sinus rhythm at 75 bpm.  Nonspecific ST changes.  Normal intervals.  Recent Labs: BMP Latest Ref Rng & Units 11/06/2015 11/03/2015 11/01/2015  Glucose 65 - 99 mg/dL 97 87 80  BUN 6 - 20 mg/dL 25(H) 20 -  Creatinine 0.44 - 1.00 mg/dL 8.13(H) 7.41(H) -  Sodium 135 - 145 mmol/L 135 136 132(L)  Potassium 3.5 - 5.1 mmol/L 4.3 3.8 4.2  Chloride 101 - 111 mmol/L 96(L) 97(L) -  CO2 22 - 32 mmol/L 30 28 -  Calcium 8.9 - 10.3 mg/dL 8.2(L) 8.3(L) -     Hepatic Function Latest Ref Rng & Units 11/06/2015 11/03/2015 10/30/2015  Total Protein 6.5 - 8.1 g/dL - - -  Albumin 3.5 - 5.0 g/dL 2.6(L) 2.6(L) 2.6(L)  AST 15 - 41 U/L - - -  ALT 14 - 54 U/L - - -  Alk Phosphatase 38 - 126 U/L - - -  Total Bilirubin 0.3 - 1.2 mg/dL - - -    CBC Latest Ref Rng & Units 11/06/2015 11/03/2015 11/01/2015  WBC 4.0 - 10.5 K/uL 6.2 7.0 -  Hemoglobin 12.0 - 15.0 g/dL 10.0(L) 10.0(L) 12.9  Hematocrit 36.0 - 46.0 % 33.4(L) 32.7(L) 38.0  Platelets 150 - 400 K/uL 245 199 -   Lab Results  Component Value  Date   MCV 89.8 11/06/2015   MCV 88.9 11/03/2015   MCV 90.3 10/30/2015   Lab Results  Component Value Date   TSH 1.541 10/07/2015   Lab Results  Component Value Date   HGBA1C 6.2 (H) 10/07/2015     BNP    Component Value Date/Time   BNP 464.4 (H) 10/04/2015 2319    ProBNP No results found for: PROBNP   Lipid Panel  No results found for: CHOL, TRIG, HDL, CHOLHDL, VLDL, LDLCALC, LDLDIRECT   RADIOLOGY: No results found.   Additional studies/ records that were reviewed today include:  I have reviewed the office notes from her prior providers.  I reviewed her hospitalization.  I reviewed laboratory.  I have reviewed her sleep study from [facility] and the notes of Dr. Gareth Eagle   ASSESSMENT:    1. OSA on CPAP   2. Essential hypertension   3. Hyperlipidemia with target LDL less than 70   4. Morbid obesity, unspecified obesity type ([place])   5. ESRD on dialysis ([place])   6. Sinus tachycardia      PLAN:  Laura. Millett is a 61 year old female who is a history of hypertension, hyperlipidemia, obstructive sleep apnea, and end-stage renal disease.  She has been on dialysis since May 2017 And is currently going for dialysis on Tuesdays, Thursdays and Saturdays.  Prior to that, she had issues with significant volume overload and acute on chronic diastolic heart failure.  Since being on dialysis, her volume status has stabilized.  She transiently had had issues with dialysis and her medications have been reduced.  Her blood pressure today is low normal, but she is tachycardic.  She has been taking metoprolol succinate at bedtime and I have suggested that she take this in the morning on nondialysis days.  She is on atorvastatin 40 mg for hyperlipidemia with target LDL less than 70.  She has been documented have obstructive sleep apnea since 2009.  I reviewed her sleep study in detail.  Her sleep apnea AHI was 12.8.  Overall, but she had severe sleep apnea with rim sleep.  It  has been 9 years since her initial evaluation.  There also has been continued obesity and her BMI is now 56.01, consistent with super morbid obesity.  I will list be assuming her sleep management.  I have recommended a new 6 split-night sleep study.  She qualifies for new machine and following her sleep study.  A new CPAP set up.  Will be initiated with plans for sleep clinic follow-up evaluation.     Medication Adjustments/Labs and Tests Ordered: Current medicines are reviewed at length with the patient today.  Concerns regarding medicines are outlined above.  Medication changes, Labs and Tests ordered today are listed in the Patient Instructions below. Patient Instructions  Your physician has recommended that you have a spilt-night sleep study. This test records several body functions during sleep, including: brain activity, eye movement, oxygen and carbon dioxide blood levels, heart rate and rhythm, breathing rate and rhythm, the flow of air through your mouth and nose, snoring, body muscle movements, and chest and belly movement. This will be done at [place].  Your physician recommends that you schedule a follow-up appointment after sleep study.        Signed, Laura Majestic, MD, [facility] 11/30/2016 8:31  [facility] [address] [place], [place], [place]  [address] Phone: [phone]

## 2016-11-28 NOTE — Patient Instructions (Addendum)
Your physician has recommended that you have a spilt-night sleep study. This test records several body functions during sleep, including: brain activity, eye movement, oxygen and carbon dioxide blood levels, heart rate and rhythm, breathing rate and rhythm, the flow of air through your mouth and nose, snoring, body muscle movements, and chest and belly movement. This will be done at [place].  Your physician recommends that you schedule a follow-up appointment after sleep study.

## 2016-11-29 DIAGNOSIS — I129 Hypertensive chronic kidney disease with stage 1 through stage 4 chronic kidney disease, or unspecified chronic kidney disease: Secondary | ICD-10-CM | POA: Diagnosis not present

## 2016-11-29 DIAGNOSIS — Z992 Dependence on renal dialysis: Secondary | ICD-10-CM | POA: Diagnosis not present

## 2016-11-29 DIAGNOSIS — N186 End stage renal disease: Secondary | ICD-10-CM | POA: Diagnosis not present

## 2016-11-29 DIAGNOSIS — E1122 Type 2 diabetes mellitus with diabetic chronic kidney disease: Secondary | ICD-10-CM | POA: Diagnosis not present

## 2016-11-29 DIAGNOSIS — D689 Coagulation defect, unspecified: Secondary | ICD-10-CM | POA: Diagnosis not present

## 2016-11-29 DIAGNOSIS — N2581 Secondary hyperparathyroidism of renal origin: Secondary | ICD-10-CM | POA: Diagnosis not present

## 2016-12-01 DIAGNOSIS — D689 Coagulation defect, unspecified: Secondary | ICD-10-CM | POA: Diagnosis not present

## 2016-12-01 DIAGNOSIS — D631 Anemia in chronic kidney disease: Secondary | ICD-10-CM | POA: Diagnosis not present

## 2016-12-01 DIAGNOSIS — N186 End stage renal disease: Secondary | ICD-10-CM | POA: Diagnosis not present

## 2016-12-01 DIAGNOSIS — Z23 Encounter for immunization: Secondary | ICD-10-CM | POA: Diagnosis not present

## 2016-12-01 DIAGNOSIS — E1122 Type 2 diabetes mellitus with diabetic chronic kidney disease: Secondary | ICD-10-CM | POA: Diagnosis not present

## 2016-12-01 DIAGNOSIS — N2581 Secondary hyperparathyroidism of renal origin: Secondary | ICD-10-CM | POA: Diagnosis not present

## 2016-12-02 DIAGNOSIS — E1122 Type 2 diabetes mellitus with diabetic chronic kidney disease: Secondary | ICD-10-CM | POA: Diagnosis not present

## 2016-12-02 DIAGNOSIS — D631 Anemia in chronic kidney disease: Secondary | ICD-10-CM | POA: Diagnosis not present

## 2016-12-02 DIAGNOSIS — Z23 Encounter for immunization: Secondary | ICD-10-CM | POA: Diagnosis not present

## 2016-12-02 DIAGNOSIS — N2581 Secondary hyperparathyroidism of renal origin: Secondary | ICD-10-CM | POA: Diagnosis not present

## 2016-12-02 DIAGNOSIS — D689 Coagulation defect, unspecified: Secondary | ICD-10-CM | POA: Diagnosis not present

## 2016-12-02 DIAGNOSIS — N186 End stage renal disease: Secondary | ICD-10-CM | POA: Diagnosis not present

## 2016-12-04 DIAGNOSIS — N2581 Secondary hyperparathyroidism of renal origin: Secondary | ICD-10-CM | POA: Diagnosis not present

## 2016-12-04 DIAGNOSIS — D631 Anemia in chronic kidney disease: Secondary | ICD-10-CM | POA: Diagnosis not present

## 2016-12-04 DIAGNOSIS — N186 End stage renal disease: Secondary | ICD-10-CM | POA: Diagnosis not present

## 2016-12-04 DIAGNOSIS — Z23 Encounter for immunization: Secondary | ICD-10-CM | POA: Diagnosis not present

## 2016-12-04 DIAGNOSIS — E1122 Type 2 diabetes mellitus with diabetic chronic kidney disease: Secondary | ICD-10-CM | POA: Diagnosis not present

## 2016-12-04 DIAGNOSIS — D689 Coagulation defect, unspecified: Secondary | ICD-10-CM | POA: Diagnosis not present

## 2016-12-06 DIAGNOSIS — D689 Coagulation defect, unspecified: Secondary | ICD-10-CM | POA: Diagnosis not present

## 2016-12-06 DIAGNOSIS — Z23 Encounter for immunization: Secondary | ICD-10-CM | POA: Diagnosis not present

## 2016-12-06 DIAGNOSIS — N186 End stage renal disease: Secondary | ICD-10-CM | POA: Diagnosis not present

## 2016-12-06 DIAGNOSIS — E1122 Type 2 diabetes mellitus with diabetic chronic kidney disease: Secondary | ICD-10-CM | POA: Diagnosis not present

## 2016-12-06 DIAGNOSIS — N2581 Secondary hyperparathyroidism of renal origin: Secondary | ICD-10-CM | POA: Diagnosis not present

## 2016-12-06 DIAGNOSIS — D631 Anemia in chronic kidney disease: Secondary | ICD-10-CM | POA: Diagnosis not present

## 2016-12-09 DIAGNOSIS — D631 Anemia in chronic kidney disease: Secondary | ICD-10-CM | POA: Diagnosis not present

## 2016-12-09 DIAGNOSIS — N2581 Secondary hyperparathyroidism of renal origin: Secondary | ICD-10-CM | POA: Diagnosis not present

## 2016-12-09 DIAGNOSIS — N186 End stage renal disease: Secondary | ICD-10-CM | POA: Diagnosis not present

## 2016-12-09 DIAGNOSIS — E1122 Type 2 diabetes mellitus with diabetic chronic kidney disease: Secondary | ICD-10-CM | POA: Diagnosis not present

## 2016-12-09 DIAGNOSIS — Z23 Encounter for immunization: Secondary | ICD-10-CM | POA: Diagnosis not present

## 2016-12-09 DIAGNOSIS — D689 Coagulation defect, unspecified: Secondary | ICD-10-CM | POA: Diagnosis not present

## 2016-12-11 DIAGNOSIS — D689 Coagulation defect, unspecified: Secondary | ICD-10-CM | POA: Diagnosis not present

## 2016-12-11 DIAGNOSIS — E1122 Type 2 diabetes mellitus with diabetic chronic kidney disease: Secondary | ICD-10-CM | POA: Diagnosis not present

## 2016-12-11 DIAGNOSIS — Z23 Encounter for immunization: Secondary | ICD-10-CM | POA: Diagnosis not present

## 2016-12-11 DIAGNOSIS — D631 Anemia in chronic kidney disease: Secondary | ICD-10-CM | POA: Diagnosis not present

## 2016-12-11 DIAGNOSIS — N186 End stage renal disease: Secondary | ICD-10-CM | POA: Diagnosis not present

## 2016-12-11 DIAGNOSIS — N2581 Secondary hyperparathyroidism of renal origin: Secondary | ICD-10-CM | POA: Diagnosis not present

## 2016-12-13 DIAGNOSIS — D631 Anemia in chronic kidney disease: Secondary | ICD-10-CM | POA: Diagnosis not present

## 2016-12-13 DIAGNOSIS — D689 Coagulation defect, unspecified: Secondary | ICD-10-CM | POA: Diagnosis not present

## 2016-12-13 DIAGNOSIS — N186 End stage renal disease: Secondary | ICD-10-CM | POA: Diagnosis not present

## 2016-12-13 DIAGNOSIS — Z23 Encounter for immunization: Secondary | ICD-10-CM | POA: Diagnosis not present

## 2016-12-13 DIAGNOSIS — E1122 Type 2 diabetes mellitus with diabetic chronic kidney disease: Secondary | ICD-10-CM | POA: Diagnosis not present

## 2016-12-13 DIAGNOSIS — N2581 Secondary hyperparathyroidism of renal origin: Secondary | ICD-10-CM | POA: Diagnosis not present

## 2016-12-16 DIAGNOSIS — Z23 Encounter for immunization: Secondary | ICD-10-CM | POA: Diagnosis not present

## 2016-12-16 DIAGNOSIS — D689 Coagulation defect, unspecified: Secondary | ICD-10-CM | POA: Diagnosis not present

## 2016-12-16 DIAGNOSIS — E1122 Type 2 diabetes mellitus with diabetic chronic kidney disease: Secondary | ICD-10-CM | POA: Diagnosis not present

## 2016-12-16 DIAGNOSIS — N2581 Secondary hyperparathyroidism of renal origin: Secondary | ICD-10-CM | POA: Diagnosis not present

## 2016-12-16 DIAGNOSIS — D631 Anemia in chronic kidney disease: Secondary | ICD-10-CM | POA: Diagnosis not present

## 2016-12-16 DIAGNOSIS — N186 End stage renal disease: Secondary | ICD-10-CM | POA: Diagnosis not present

## 2016-12-18 DIAGNOSIS — N186 End stage renal disease: Secondary | ICD-10-CM | POA: Diagnosis not present

## 2016-12-18 DIAGNOSIS — D631 Anemia in chronic kidney disease: Secondary | ICD-10-CM | POA: Diagnosis not present

## 2016-12-18 DIAGNOSIS — N2581 Secondary hyperparathyroidism of renal origin: Secondary | ICD-10-CM | POA: Diagnosis not present

## 2016-12-18 DIAGNOSIS — D689 Coagulation defect, unspecified: Secondary | ICD-10-CM | POA: Diagnosis not present

## 2016-12-18 DIAGNOSIS — Z23 Encounter for immunization: Secondary | ICD-10-CM | POA: Diagnosis not present

## 2016-12-18 DIAGNOSIS — E1122 Type 2 diabetes mellitus with diabetic chronic kidney disease: Secondary | ICD-10-CM | POA: Diagnosis not present

## 2016-12-19 ENCOUNTER — Encounter: Payer: Self-pay | Admitting: Vascular Surgery

## 2016-12-20 DIAGNOSIS — D689 Coagulation defect, unspecified: Secondary | ICD-10-CM | POA: Diagnosis not present

## 2016-12-20 DIAGNOSIS — N186 End stage renal disease: Secondary | ICD-10-CM | POA: Diagnosis not present

## 2016-12-20 DIAGNOSIS — Z23 Encounter for immunization: Secondary | ICD-10-CM | POA: Diagnosis not present

## 2016-12-20 DIAGNOSIS — E1122 Type 2 diabetes mellitus with diabetic chronic kidney disease: Secondary | ICD-10-CM | POA: Diagnosis not present

## 2016-12-20 DIAGNOSIS — D631 Anemia in chronic kidney disease: Secondary | ICD-10-CM | POA: Diagnosis not present

## 2016-12-20 DIAGNOSIS — N2581 Secondary hyperparathyroidism of renal origin: Secondary | ICD-10-CM | POA: Diagnosis not present

## 2016-12-23 DIAGNOSIS — N186 End stage renal disease: Secondary | ICD-10-CM | POA: Diagnosis not present

## 2016-12-23 DIAGNOSIS — E1122 Type 2 diabetes mellitus with diabetic chronic kidney disease: Secondary | ICD-10-CM | POA: Diagnosis not present

## 2016-12-23 DIAGNOSIS — D631 Anemia in chronic kidney disease: Secondary | ICD-10-CM | POA: Diagnosis not present

## 2016-12-23 DIAGNOSIS — D689 Coagulation defect, unspecified: Secondary | ICD-10-CM | POA: Diagnosis not present

## 2016-12-23 DIAGNOSIS — Z23 Encounter for immunization: Secondary | ICD-10-CM | POA: Diagnosis not present

## 2016-12-23 DIAGNOSIS — N2581 Secondary hyperparathyroidism of renal origin: Secondary | ICD-10-CM | POA: Diagnosis not present

## 2016-12-25 DIAGNOSIS — D689 Coagulation defect, unspecified: Secondary | ICD-10-CM | POA: Diagnosis not present

## 2016-12-25 DIAGNOSIS — D631 Anemia in chronic kidney disease: Secondary | ICD-10-CM | POA: Diagnosis not present

## 2016-12-25 DIAGNOSIS — N2581 Secondary hyperparathyroidism of renal origin: Secondary | ICD-10-CM | POA: Diagnosis not present

## 2016-12-25 DIAGNOSIS — Z23 Encounter for immunization: Secondary | ICD-10-CM | POA: Diagnosis not present

## 2016-12-25 DIAGNOSIS — E1122 Type 2 diabetes mellitus with diabetic chronic kidney disease: Secondary | ICD-10-CM | POA: Diagnosis not present

## 2016-12-25 DIAGNOSIS — N186 End stage renal disease: Secondary | ICD-10-CM | POA: Diagnosis not present

## 2016-12-26 ENCOUNTER — Ambulatory Visit (HOSPITAL_COMMUNITY)
Admission: RE | Admit: 2016-12-26 | Discharge: 2016-12-26 | Disposition: A | Discharge status: Home / Self Care | Payer: Medicare Other | Source: Ambulatory Visit | Attending: Vascular Surgery | Admitting: Vascular Surgery

## 2016-12-26 ENCOUNTER — Ambulatory Visit (INDEPENDENT_AMBULATORY_CARE_PROVIDER_SITE_OTHER): Payer: Medicare Other | Admitting: Vascular Surgery

## 2016-12-26 ENCOUNTER — Encounter: Payer: Self-pay | Admitting: Vascular Surgery

## 2016-12-26 VITALS — BP 93/63 | HR 98 | Temp 97.5°F | Resp 18 | Ht 66.0 in | Wt 342.0 lb

## 2016-12-26 DIAGNOSIS — I739 Peripheral vascular disease, unspecified: Secondary | ICD-10-CM | POA: Diagnosis not present

## 2016-12-26 DIAGNOSIS — Z992 Dependence on renal dialysis: Secondary | ICD-10-CM

## 2016-12-26 DIAGNOSIS — R0989 Other specified symptoms and signs involving the circulatory and respiratory systems: Secondary | ICD-10-CM | POA: Diagnosis not present

## 2016-12-26 DIAGNOSIS — N186 End stage renal disease: Secondary | ICD-10-CM

## 2016-12-26 NOTE — Progress Notes (Signed)
Patient ID: [name], female   DOB: [birth year], 61 y.o.   MRN: [id number]  Reason for Consult: New Evaluation (eval for PAD )   Referred by Anda Kraft, MD  Subjective:     HPI:  [name] is a 61 y.o. female with history of end-stage renal disease, diabetes, hypertension on dialysis via left upper extremity AV fistula. She is referred here for home screening for PAD which showed decreased ABI on the right 0.36. She denies any claudication, rest pain, tissue loss, ulceration. She does take aspirin. She does walk with the help of walker is limited mostly by her weight is shortness of breath. She has no complaints related to today's visit.  Past Medical History:  Diagnosis Date  . Arthritis   . Chronic diastolic CHF (congestive heart failure) ([place])    a. Echo 5/17: severe LVH, EF 55-60%, mild AS, mean AV gradient 10 mmHg, mod LAE, PASP 37 mmHg  . Diabetes mellitus without complication ([place])    type 2  . ESRD (end stage renal disease) ([facility])    Tues, Thurs, Sat dialysis  . H/O blood clots    many years ago in leg  . History of pneumonia   . HLD (hyperlipidemia)   . Hypertension    BP low after dialysis started, no further BP meds  . Seizures ([place])    had one or two (more than 10-15 years ago)  . Sleep apnea    uses c-pap most of time   Family History  Problem Relation Age of Onset  . Cancer Mother   . Hypertension Father   . Heart attack Sister   . Hypertension Sister   . Heart attack Paternal Aunt   . Hypertension Brother   . Hypertension Sister   . Hypertension Sister   . Hypertension Sister   . Hypertension Brother   . Stroke Neg Hx    Past Surgical History:  Procedure Laterality Date  . ABDOMINAL HYSTERECTOMY    . AV FISTULA PLACEMENT Left 07/25/2015   Procedure: ARTERIOVENOUS (AV) FISTULA CREATION -LEFT BRACHIOCEPHALIC;  Surgeon: Conrad Prince William, MD;  Location: [place];  Service: Vascular;  Laterality: Left;  . COLONOSCOPY    . FISTULA  SUPERFICIALIZATION Left 11/01/2015   Procedure: SUPERFICIALIZATION OF LEFT ARM BRACHIOCEPHALIC FISTULA ;  Surgeon: Conrad King George, MD;  Location: [place];  Service: Vascular;  Laterality: Left;  . INSERTION OF DIALYSIS CATHETER Right 10/09/2015   Procedure: INSERTION OF DIALYSIS CATHETER RIGHT INTERNAL JUGULAR ;  Surgeon: Angelia Mould, MD;  Location: [place];  Service: Vascular;  Laterality: Right;    Short Social History:  Social History  Substance Use Topics  . Smoking status: Never Smoker  . Smokeless tobacco: Never Used  . Alcohol use No    Allergies  Allergen Reactions  . Morphine And Related Other (See Comments)    Per patient, morphine causes seizures    Current Outpatient Prescriptions  Medication Sig Dispense Refill  . aspirin 81 MG tablet Take 81 mg by mouth daily.    Marland Kitchen atorvastatin (LIPITOR) 40 MG tablet Take 40 mg by mouth daily.    . cinacalcet (SENSIPAR) 60 MG tablet Take 60 mg by mouth daily.    Marland Kitchen docusate sodium (COLACE) 50 MG capsule Take 50 mg by mouth 2 (two) times daily.    . febuxostat (ULORIC) 40 MG tablet Take 40 mg by mouth daily.    Marland Kitchen FOSRENOL 1000 MG chewable tablet Chew 1,000 mg by  mouth 3 (three) times daily with meals.    . Loratadine (CLARITIN PO) Take 1 tablet by mouth daily.    . metoprolol succinate (TOPROL-XL) 50 MG 24 hr tablet Take 50 mg by mouth daily. DO NOT TAKE THE NIGHT PRIOR TO YOUR DIALYSIS  0  . multivitamin (RENA-VIT) TABS tablet Take 1 tablet by mouth daily. 30 each 0  . ONETOUCH VERIO test strip TEST 2 TIMES A DAY AND AS NEEDED FOR BLOOD SUGAR  0  . senna-docusate (SENOKOT-S) 8.6-50 MG tablet Take 2 tablets by mouth 2 (two) times daily. 120 tablet 0  . TRESIBA FLEXTOUCH 100 UNIT/ML SOPN FlexTouch Pen Inject 14 Units into the skin daily.  0   No current facility-administered medications for this visit.     Review of Systems  Constitutional:  Constitutional negative. HENT: HENT negative.  Eyes: Eyes negative.  Respiratory: Positive  for shortness of breath.  Cardiovascular: Cardiovascular negative.  GI: Gastrointestinal negative.  Musculoskeletal: Musculoskeletal negative.  Skin: Skin negative.  Neurological: Neurological negative. Hematologic: Hematologic/lymphatic negative.  Psychiatric: Psychiatric negative.        Objective:  Objective   Vitals:   12/26/16 1346 12/26/16 1348  BP: 90/65 93/63  Pulse: 98   Resp: 18   Temp: (!) 97.5 F (36.4 C)   TempSrc: Oral   SpO2: 95%   Weight: (!) 342 lb (155.1 kg)   Height: 5\' 6"  (1.676 m)    Body mass index is 55.2 kg/m.  Physical Exam  Constitutional: She is oriented to person, place, and time. She appears well-developed.  HENT:  Head: Normocephalic.  Eyes: Pupils are equal, round, and reactive to light.  Neck: Normal range of motion.  Cardiovascular: Normal rate.   Pulses:      Radial pulses are 2+ on the right side, and 2+ on the left side.  Monophasic AT bilaterally, strong biphasic PT bilaterally  Musculoskeletal: She exhibits no edema.  Neurological: She is alert and oriented to person, place, and time.  Skin: Skin is warm and dry.  Psychiatric: She has a normal mood and affect. Her behavior is normal. Judgment and thought content normal.    Data: ABIs today interpreted by me demonstrate noncompressible vessels that are triphasic on the right and biphasic on the left. Toe pressures are 87 on the left is 64.     Assessment/Plan:     61 year old female here for evaluation of lower extremity peripheral arterial disease after having home study demonstrating decreased ABI on the right. ABIs today are noncompressible but toe pressures are greater than 60 bilaterally. She has strong PT signals bilaterally she does not have any claudication rest pain tissue loss. The standpoint she does not need any vascular intervention on her lower extremities. She is on dialysis via left upper extremity AV fistula is working well. I recommended she continue aspirin and  a statin drug and good foot care to prevent any wounds. Shehas good understanding follow-up on a when necessary basis.     Waynetta Sandy MD [facility]

## 2016-12-27 DIAGNOSIS — D689 Coagulation defect, unspecified: Secondary | ICD-10-CM | POA: Diagnosis not present

## 2016-12-27 DIAGNOSIS — Z23 Encounter for immunization: Secondary | ICD-10-CM | POA: Diagnosis not present

## 2016-12-27 DIAGNOSIS — E1122 Type 2 diabetes mellitus with diabetic chronic kidney disease: Secondary | ICD-10-CM | POA: Diagnosis not present

## 2016-12-27 DIAGNOSIS — D631 Anemia in chronic kidney disease: Secondary | ICD-10-CM | POA: Diagnosis not present

## 2016-12-27 DIAGNOSIS — N2581 Secondary hyperparathyroidism of renal origin: Secondary | ICD-10-CM | POA: Diagnosis not present

## 2016-12-27 DIAGNOSIS — N186 End stage renal disease: Secondary | ICD-10-CM | POA: Diagnosis not present

## 2016-12-30 DIAGNOSIS — I129 Hypertensive chronic kidney disease with stage 1 through stage 4 chronic kidney disease, or unspecified chronic kidney disease: Secondary | ICD-10-CM | POA: Diagnosis not present

## 2016-12-30 DIAGNOSIS — D631 Anemia in chronic kidney disease: Secondary | ICD-10-CM | POA: Diagnosis not present

## 2016-12-30 DIAGNOSIS — D689 Coagulation defect, unspecified: Secondary | ICD-10-CM | POA: Diagnosis not present

## 2016-12-30 DIAGNOSIS — Z23 Encounter for immunization: Secondary | ICD-10-CM | POA: Diagnosis not present

## 2016-12-30 DIAGNOSIS — N186 End stage renal disease: Secondary | ICD-10-CM | POA: Diagnosis not present

## 2016-12-30 DIAGNOSIS — Z992 Dependence on renal dialysis: Secondary | ICD-10-CM | POA: Diagnosis not present

## 2016-12-30 DIAGNOSIS — N2581 Secondary hyperparathyroidism of renal origin: Secondary | ICD-10-CM | POA: Diagnosis not present

## 2016-12-30 DIAGNOSIS — E1122 Type 2 diabetes mellitus with diabetic chronic kidney disease: Secondary | ICD-10-CM | POA: Diagnosis not present

## 2017-01-01 DIAGNOSIS — E1122 Type 2 diabetes mellitus with diabetic chronic kidney disease: Secondary | ICD-10-CM | POA: Diagnosis not present

## 2017-01-01 DIAGNOSIS — D689 Coagulation defect, unspecified: Secondary | ICD-10-CM | POA: Diagnosis not present

## 2017-01-01 DIAGNOSIS — D631 Anemia in chronic kidney disease: Secondary | ICD-10-CM | POA: Diagnosis not present

## 2017-01-01 DIAGNOSIS — N186 End stage renal disease: Secondary | ICD-10-CM | POA: Diagnosis not present

## 2017-01-01 DIAGNOSIS — N2581 Secondary hyperparathyroidism of renal origin: Secondary | ICD-10-CM | POA: Diagnosis not present

## 2017-01-03 DIAGNOSIS — N186 End stage renal disease: Secondary | ICD-10-CM | POA: Diagnosis not present

## 2017-01-03 DIAGNOSIS — D689 Coagulation defect, unspecified: Secondary | ICD-10-CM | POA: Diagnosis not present

## 2017-01-03 DIAGNOSIS — D631 Anemia in chronic kidney disease: Secondary | ICD-10-CM | POA: Diagnosis not present

## 2017-01-03 DIAGNOSIS — E1122 Type 2 diabetes mellitus with diabetic chronic kidney disease: Secondary | ICD-10-CM | POA: Diagnosis not present

## 2017-01-03 DIAGNOSIS — N2581 Secondary hyperparathyroidism of renal origin: Secondary | ICD-10-CM | POA: Diagnosis not present

## 2017-01-06 DIAGNOSIS — N186 End stage renal disease: Secondary | ICD-10-CM | POA: Diagnosis not present

## 2017-01-06 DIAGNOSIS — D689 Coagulation defect, unspecified: Secondary | ICD-10-CM | POA: Diagnosis not present

## 2017-01-06 DIAGNOSIS — E1122 Type 2 diabetes mellitus with diabetic chronic kidney disease: Secondary | ICD-10-CM | POA: Diagnosis not present

## 2017-01-06 DIAGNOSIS — N2581 Secondary hyperparathyroidism of renal origin: Secondary | ICD-10-CM | POA: Diagnosis not present

## 2017-01-06 DIAGNOSIS — D631 Anemia in chronic kidney disease: Secondary | ICD-10-CM | POA: Diagnosis not present

## 2017-01-08 DIAGNOSIS — D689 Coagulation defect, unspecified: Secondary | ICD-10-CM | POA: Diagnosis not present

## 2017-01-08 DIAGNOSIS — N2581 Secondary hyperparathyroidism of renal origin: Secondary | ICD-10-CM | POA: Diagnosis not present

## 2017-01-08 DIAGNOSIS — D631 Anemia in chronic kidney disease: Secondary | ICD-10-CM | POA: Diagnosis not present

## 2017-01-08 DIAGNOSIS — N186 End stage renal disease: Secondary | ICD-10-CM | POA: Diagnosis not present

## 2017-01-08 DIAGNOSIS — E1122 Type 2 diabetes mellitus with diabetic chronic kidney disease: Secondary | ICD-10-CM | POA: Diagnosis not present

## 2017-01-10 DIAGNOSIS — D631 Anemia in chronic kidney disease: Secondary | ICD-10-CM | POA: Diagnosis not present

## 2017-01-10 DIAGNOSIS — N2581 Secondary hyperparathyroidism of renal origin: Secondary | ICD-10-CM | POA: Diagnosis not present

## 2017-01-10 DIAGNOSIS — N186 End stage renal disease: Secondary | ICD-10-CM | POA: Diagnosis not present

## 2017-01-10 DIAGNOSIS — D689 Coagulation defect, unspecified: Secondary | ICD-10-CM | POA: Diagnosis not present

## 2017-01-10 DIAGNOSIS — E1122 Type 2 diabetes mellitus with diabetic chronic kidney disease: Secondary | ICD-10-CM | POA: Diagnosis not present

## 2017-01-13 DIAGNOSIS — D689 Coagulation defect, unspecified: Secondary | ICD-10-CM | POA: Diagnosis not present

## 2017-01-13 DIAGNOSIS — N186 End stage renal disease: Secondary | ICD-10-CM | POA: Diagnosis not present

## 2017-01-13 DIAGNOSIS — E1122 Type 2 diabetes mellitus with diabetic chronic kidney disease: Secondary | ICD-10-CM | POA: Diagnosis not present

## 2017-01-13 DIAGNOSIS — N2581 Secondary hyperparathyroidism of renal origin: Secondary | ICD-10-CM | POA: Diagnosis not present

## 2017-01-13 DIAGNOSIS — D631 Anemia in chronic kidney disease: Secondary | ICD-10-CM | POA: Diagnosis not present

## 2017-01-15 DIAGNOSIS — N186 End stage renal disease: Secondary | ICD-10-CM | POA: Diagnosis not present

## 2017-01-15 DIAGNOSIS — D631 Anemia in chronic kidney disease: Secondary | ICD-10-CM | POA: Diagnosis not present

## 2017-01-15 DIAGNOSIS — N2581 Secondary hyperparathyroidism of renal origin: Secondary | ICD-10-CM | POA: Diagnosis not present

## 2017-01-15 DIAGNOSIS — E1122 Type 2 diabetes mellitus with diabetic chronic kidney disease: Secondary | ICD-10-CM | POA: Diagnosis not present

## 2017-01-15 DIAGNOSIS — D689 Coagulation defect, unspecified: Secondary | ICD-10-CM | POA: Diagnosis not present

## 2017-01-17 DIAGNOSIS — E1122 Type 2 diabetes mellitus with diabetic chronic kidney disease: Secondary | ICD-10-CM | POA: Diagnosis not present

## 2017-01-17 DIAGNOSIS — D689 Coagulation defect, unspecified: Secondary | ICD-10-CM | POA: Diagnosis not present

## 2017-01-17 DIAGNOSIS — N186 End stage renal disease: Secondary | ICD-10-CM | POA: Diagnosis not present

## 2017-01-17 DIAGNOSIS — N2581 Secondary hyperparathyroidism of renal origin: Secondary | ICD-10-CM | POA: Diagnosis not present

## 2017-01-17 DIAGNOSIS — D631 Anemia in chronic kidney disease: Secondary | ICD-10-CM | POA: Diagnosis not present

## 2017-01-20 DIAGNOSIS — E1122 Type 2 diabetes mellitus with diabetic chronic kidney disease: Secondary | ICD-10-CM | POA: Diagnosis not present

## 2017-01-20 DIAGNOSIS — D689 Coagulation defect, unspecified: Secondary | ICD-10-CM | POA: Diagnosis not present

## 2017-01-20 DIAGNOSIS — N186 End stage renal disease: Secondary | ICD-10-CM | POA: Diagnosis not present

## 2017-01-20 DIAGNOSIS — D631 Anemia in chronic kidney disease: Secondary | ICD-10-CM | POA: Diagnosis not present

## 2017-01-20 DIAGNOSIS — N2581 Secondary hyperparathyroidism of renal origin: Secondary | ICD-10-CM | POA: Diagnosis not present

## 2017-01-22 DIAGNOSIS — N2581 Secondary hyperparathyroidism of renal origin: Secondary | ICD-10-CM | POA: Diagnosis not present

## 2017-01-22 DIAGNOSIS — D689 Coagulation defect, unspecified: Secondary | ICD-10-CM | POA: Diagnosis not present

## 2017-01-22 DIAGNOSIS — E1122 Type 2 diabetes mellitus with diabetic chronic kidney disease: Secondary | ICD-10-CM | POA: Diagnosis not present

## 2017-01-22 DIAGNOSIS — D631 Anemia in chronic kidney disease: Secondary | ICD-10-CM | POA: Diagnosis not present

## 2017-01-22 DIAGNOSIS — N186 End stage renal disease: Secondary | ICD-10-CM | POA: Diagnosis not present

## 2017-01-24 DIAGNOSIS — N2581 Secondary hyperparathyroidism of renal origin: Secondary | ICD-10-CM | POA: Diagnosis not present

## 2017-01-24 DIAGNOSIS — D631 Anemia in chronic kidney disease: Secondary | ICD-10-CM | POA: Diagnosis not present

## 2017-01-24 DIAGNOSIS — D689 Coagulation defect, unspecified: Secondary | ICD-10-CM | POA: Diagnosis not present

## 2017-01-24 DIAGNOSIS — N186 End stage renal disease: Secondary | ICD-10-CM | POA: Diagnosis not present

## 2017-01-24 DIAGNOSIS — E1122 Type 2 diabetes mellitus with diabetic chronic kidney disease: Secondary | ICD-10-CM | POA: Diagnosis not present

## 2017-01-27 DIAGNOSIS — D631 Anemia in chronic kidney disease: Secondary | ICD-10-CM | POA: Diagnosis not present

## 2017-01-27 DIAGNOSIS — N186 End stage renal disease: Secondary | ICD-10-CM | POA: Diagnosis not present

## 2017-01-27 DIAGNOSIS — D689 Coagulation defect, unspecified: Secondary | ICD-10-CM | POA: Diagnosis not present

## 2017-01-27 DIAGNOSIS — N2581 Secondary hyperparathyroidism of renal origin: Secondary | ICD-10-CM | POA: Diagnosis not present

## 2017-01-27 DIAGNOSIS — E1122 Type 2 diabetes mellitus with diabetic chronic kidney disease: Secondary | ICD-10-CM | POA: Diagnosis not present

## 2017-01-28 DIAGNOSIS — E1122 Type 2 diabetes mellitus with diabetic chronic kidney disease: Secondary | ICD-10-CM | POA: Diagnosis not present

## 2017-01-28 DIAGNOSIS — D689 Coagulation defect, unspecified: Secondary | ICD-10-CM | POA: Diagnosis not present

## 2017-01-28 DIAGNOSIS — N2581 Secondary hyperparathyroidism of renal origin: Secondary | ICD-10-CM | POA: Diagnosis not present

## 2017-01-28 DIAGNOSIS — N186 End stage renal disease: Secondary | ICD-10-CM | POA: Diagnosis not present

## 2017-01-28 DIAGNOSIS — D631 Anemia in chronic kidney disease: Secondary | ICD-10-CM | POA: Diagnosis not present

## 2017-01-29 ENCOUNTER — Ambulatory Visit (HOSPITAL_BASED_OUTPATIENT_CLINIC_OR_DEPARTMENT_OTHER): Payer: Medicare Other | Attending: Cardiovascular Disease | Admitting: Cardiovascular Disease

## 2017-01-29 VITALS — Ht 66.0 in | Wt 330.0 lb

## 2017-01-29 DIAGNOSIS — E669 Obesity, unspecified: Secondary | ICD-10-CM | POA: Insufficient documentation

## 2017-01-29 DIAGNOSIS — G4733 Obstructive sleep apnea (adult) (pediatric): Secondary | ICD-10-CM | POA: Insufficient documentation

## 2017-01-29 DIAGNOSIS — Z7982 Long term (current) use of aspirin: Secondary | ICD-10-CM | POA: Diagnosis not present

## 2017-01-29 DIAGNOSIS — Z79899 Other long term (current) drug therapy: Secondary | ICD-10-CM | POA: Diagnosis not present

## 2017-01-29 DIAGNOSIS — G4736 Sleep related hypoventilation in conditions classified elsewhere: Secondary | ICD-10-CM | POA: Insufficient documentation

## 2017-01-29 DIAGNOSIS — Z6841 Body Mass Index (BMI) 40.0 and over, adult: Secondary | ICD-10-CM | POA: Insufficient documentation

## 2017-01-29 DIAGNOSIS — I110 Hypertensive heart disease with heart failure: Secondary | ICD-10-CM | POA: Diagnosis not present

## 2017-01-29 DIAGNOSIS — Z9989 Dependence on other enabling machines and devices: Secondary | ICD-10-CM | POA: Diagnosis not present

## 2017-01-29 DIAGNOSIS — E119 Type 2 diabetes mellitus without complications: Secondary | ICD-10-CM | POA: Insufficient documentation

## 2017-01-29 DIAGNOSIS — N186 End stage renal disease: Secondary | ICD-10-CM | POA: Diagnosis not present

## 2017-01-29 DIAGNOSIS — I509 Heart failure, unspecified: Secondary | ICD-10-CM | POA: Insufficient documentation

## 2017-01-29 DIAGNOSIS — D689 Coagulation defect, unspecified: Secondary | ICD-10-CM | POA: Diagnosis not present

## 2017-01-29 DIAGNOSIS — N2581 Secondary hyperparathyroidism of renal origin: Secondary | ICD-10-CM | POA: Diagnosis not present

## 2017-01-29 DIAGNOSIS — D631 Anemia in chronic kidney disease: Secondary | ICD-10-CM | POA: Diagnosis not present

## 2017-01-29 DIAGNOSIS — E1122 Type 2 diabetes mellitus with diabetic chronic kidney disease: Secondary | ICD-10-CM | POA: Diagnosis not present

## 2017-01-30 DIAGNOSIS — I129 Hypertensive chronic kidney disease with stage 1 through stage 4 chronic kidney disease, or unspecified chronic kidney disease: Secondary | ICD-10-CM | POA: Diagnosis not present

## 2017-01-30 DIAGNOSIS — Z992 Dependence on renal dialysis: Secondary | ICD-10-CM | POA: Diagnosis not present

## 2017-01-30 DIAGNOSIS — N186 End stage renal disease: Secondary | ICD-10-CM | POA: Diagnosis not present

## 2017-01-31 DIAGNOSIS — N186 End stage renal disease: Secondary | ICD-10-CM | POA: Diagnosis not present

## 2017-01-31 DIAGNOSIS — N2581 Secondary hyperparathyroidism of renal origin: Secondary | ICD-10-CM | POA: Diagnosis not present

## 2017-01-31 DIAGNOSIS — D631 Anemia in chronic kidney disease: Secondary | ICD-10-CM | POA: Diagnosis not present

## 2017-01-31 DIAGNOSIS — D689 Coagulation defect, unspecified: Secondary | ICD-10-CM | POA: Diagnosis not present

## 2017-01-31 DIAGNOSIS — Z992 Dependence on renal dialysis: Secondary | ICD-10-CM | POA: Diagnosis not present

## 2017-01-31 DIAGNOSIS — E1122 Type 2 diabetes mellitus with diabetic chronic kidney disease: Secondary | ICD-10-CM | POA: Diagnosis not present

## 2017-02-03 ENCOUNTER — Other Ambulatory Visit (HOSPITAL_BASED_OUTPATIENT_CLINIC_OR_DEPARTMENT_OTHER): Payer: Self-pay

## 2017-02-03 DIAGNOSIS — D631 Anemia in chronic kidney disease: Secondary | ICD-10-CM | POA: Diagnosis not present

## 2017-02-03 DIAGNOSIS — D689 Coagulation defect, unspecified: Secondary | ICD-10-CM | POA: Diagnosis not present

## 2017-02-03 DIAGNOSIS — G4733 Obstructive sleep apnea (adult) (pediatric): Secondary | ICD-10-CM

## 2017-02-03 DIAGNOSIS — Z992 Dependence on renal dialysis: Secondary | ICD-10-CM | POA: Diagnosis not present

## 2017-02-03 DIAGNOSIS — E1122 Type 2 diabetes mellitus with diabetic chronic kidney disease: Secondary | ICD-10-CM | POA: Diagnosis not present

## 2017-02-03 DIAGNOSIS — N2581 Secondary hyperparathyroidism of renal origin: Secondary | ICD-10-CM | POA: Diagnosis not present

## 2017-02-03 DIAGNOSIS — Z9989 Dependence on other enabling machines and devices: Principal | ICD-10-CM

## 2017-02-03 DIAGNOSIS — N186 End stage renal disease: Secondary | ICD-10-CM | POA: Diagnosis not present

## 2017-02-05 DIAGNOSIS — D631 Anemia in chronic kidney disease: Secondary | ICD-10-CM | POA: Diagnosis not present

## 2017-02-05 DIAGNOSIS — N2581 Secondary hyperparathyroidism of renal origin: Secondary | ICD-10-CM | POA: Diagnosis not present

## 2017-02-05 DIAGNOSIS — E1122 Type 2 diabetes mellitus with diabetic chronic kidney disease: Secondary | ICD-10-CM | POA: Diagnosis not present

## 2017-02-05 DIAGNOSIS — N186 End stage renal disease: Secondary | ICD-10-CM | POA: Diagnosis not present

## 2017-02-05 DIAGNOSIS — Z992 Dependence on renal dialysis: Secondary | ICD-10-CM | POA: Diagnosis not present

## 2017-02-05 DIAGNOSIS — D689 Coagulation defect, unspecified: Secondary | ICD-10-CM | POA: Diagnosis not present

## 2017-02-07 DIAGNOSIS — D689 Coagulation defect, unspecified: Secondary | ICD-10-CM | POA: Diagnosis not present

## 2017-02-07 DIAGNOSIS — N186 End stage renal disease: Secondary | ICD-10-CM | POA: Diagnosis not present

## 2017-02-07 DIAGNOSIS — Z992 Dependence on renal dialysis: Secondary | ICD-10-CM | POA: Diagnosis not present

## 2017-02-07 DIAGNOSIS — E1122 Type 2 diabetes mellitus with diabetic chronic kidney disease: Secondary | ICD-10-CM | POA: Diagnosis not present

## 2017-02-07 DIAGNOSIS — N2581 Secondary hyperparathyroidism of renal origin: Secondary | ICD-10-CM | POA: Diagnosis not present

## 2017-02-07 DIAGNOSIS — D631 Anemia in chronic kidney disease: Secondary | ICD-10-CM | POA: Diagnosis not present

## 2017-02-10 DIAGNOSIS — D631 Anemia in chronic kidney disease: Secondary | ICD-10-CM | POA: Diagnosis not present

## 2017-02-10 DIAGNOSIS — N2581 Secondary hyperparathyroidism of renal origin: Secondary | ICD-10-CM | POA: Diagnosis not present

## 2017-02-10 DIAGNOSIS — D689 Coagulation defect, unspecified: Secondary | ICD-10-CM | POA: Diagnosis not present

## 2017-02-10 DIAGNOSIS — N186 End stage renal disease: Secondary | ICD-10-CM | POA: Diagnosis not present

## 2017-02-10 DIAGNOSIS — E1122 Type 2 diabetes mellitus with diabetic chronic kidney disease: Secondary | ICD-10-CM | POA: Diagnosis not present

## 2017-02-10 DIAGNOSIS — Z992 Dependence on renal dialysis: Secondary | ICD-10-CM | POA: Diagnosis not present

## 2017-02-11 DIAGNOSIS — I10 Essential (primary) hypertension: Secondary | ICD-10-CM | POA: Diagnosis not present

## 2017-02-11 DIAGNOSIS — Z23 Encounter for immunization: Secondary | ICD-10-CM | POA: Diagnosis not present

## 2017-02-11 DIAGNOSIS — E119 Type 2 diabetes mellitus without complications: Secondary | ICD-10-CM | POA: Diagnosis not present

## 2017-02-12 DIAGNOSIS — N186 End stage renal disease: Secondary | ICD-10-CM | POA: Diagnosis not present

## 2017-02-12 DIAGNOSIS — E1122 Type 2 diabetes mellitus with diabetic chronic kidney disease: Secondary | ICD-10-CM | POA: Diagnosis not present

## 2017-02-12 DIAGNOSIS — Z992 Dependence on renal dialysis: Secondary | ICD-10-CM | POA: Diagnosis not present

## 2017-02-12 DIAGNOSIS — N2581 Secondary hyperparathyroidism of renal origin: Secondary | ICD-10-CM | POA: Diagnosis not present

## 2017-02-12 DIAGNOSIS — D631 Anemia in chronic kidney disease: Secondary | ICD-10-CM | POA: Diagnosis not present

## 2017-02-12 DIAGNOSIS — D689 Coagulation defect, unspecified: Secondary | ICD-10-CM | POA: Diagnosis not present

## 2017-02-14 DIAGNOSIS — N2581 Secondary hyperparathyroidism of renal origin: Secondary | ICD-10-CM | POA: Diagnosis not present

## 2017-02-14 DIAGNOSIS — E1122 Type 2 diabetes mellitus with diabetic chronic kidney disease: Secondary | ICD-10-CM | POA: Diagnosis not present

## 2017-02-14 DIAGNOSIS — D631 Anemia in chronic kidney disease: Secondary | ICD-10-CM | POA: Diagnosis not present

## 2017-02-14 DIAGNOSIS — D689 Coagulation defect, unspecified: Secondary | ICD-10-CM | POA: Diagnosis not present

## 2017-02-14 DIAGNOSIS — Z992 Dependence on renal dialysis: Secondary | ICD-10-CM | POA: Diagnosis not present

## 2017-02-14 DIAGNOSIS — N186 End stage renal disease: Secondary | ICD-10-CM | POA: Diagnosis not present

## 2017-02-17 DIAGNOSIS — D689 Coagulation defect, unspecified: Secondary | ICD-10-CM | POA: Diagnosis not present

## 2017-02-17 DIAGNOSIS — N186 End stage renal disease: Secondary | ICD-10-CM | POA: Diagnosis not present

## 2017-02-17 DIAGNOSIS — D631 Anemia in chronic kidney disease: Secondary | ICD-10-CM | POA: Diagnosis not present

## 2017-02-17 DIAGNOSIS — Z992 Dependence on renal dialysis: Secondary | ICD-10-CM | POA: Diagnosis not present

## 2017-02-17 DIAGNOSIS — E1122 Type 2 diabetes mellitus with diabetic chronic kidney disease: Secondary | ICD-10-CM | POA: Diagnosis not present

## 2017-02-17 DIAGNOSIS — N2581 Secondary hyperparathyroidism of renal origin: Secondary | ICD-10-CM | POA: Diagnosis not present

## 2017-02-19 DIAGNOSIS — D689 Coagulation defect, unspecified: Secondary | ICD-10-CM | POA: Diagnosis not present

## 2017-02-19 DIAGNOSIS — N2581 Secondary hyperparathyroidism of renal origin: Secondary | ICD-10-CM | POA: Diagnosis not present

## 2017-02-19 DIAGNOSIS — D631 Anemia in chronic kidney disease: Secondary | ICD-10-CM | POA: Diagnosis not present

## 2017-02-19 DIAGNOSIS — Z992 Dependence on renal dialysis: Secondary | ICD-10-CM | POA: Diagnosis not present

## 2017-02-19 DIAGNOSIS — N186 End stage renal disease: Secondary | ICD-10-CM | POA: Diagnosis not present

## 2017-02-19 DIAGNOSIS — E1122 Type 2 diabetes mellitus with diabetic chronic kidney disease: Secondary | ICD-10-CM | POA: Diagnosis not present

## 2017-02-20 DIAGNOSIS — N186 End stage renal disease: Secondary | ICD-10-CM | POA: Diagnosis not present

## 2017-02-20 DIAGNOSIS — N2581 Secondary hyperparathyroidism of renal origin: Secondary | ICD-10-CM | POA: Diagnosis not present

## 2017-02-20 DIAGNOSIS — E1122 Type 2 diabetes mellitus with diabetic chronic kidney disease: Secondary | ICD-10-CM | POA: Diagnosis not present

## 2017-02-20 DIAGNOSIS — Z992 Dependence on renal dialysis: Secondary | ICD-10-CM | POA: Diagnosis not present

## 2017-02-20 DIAGNOSIS — D689 Coagulation defect, unspecified: Secondary | ICD-10-CM | POA: Diagnosis not present

## 2017-02-20 DIAGNOSIS — D631 Anemia in chronic kidney disease: Secondary | ICD-10-CM | POA: Diagnosis not present

## 2017-02-21 DIAGNOSIS — N2581 Secondary hyperparathyroidism of renal origin: Secondary | ICD-10-CM | POA: Diagnosis not present

## 2017-02-21 DIAGNOSIS — Z992 Dependence on renal dialysis: Secondary | ICD-10-CM | POA: Diagnosis not present

## 2017-02-21 DIAGNOSIS — D689 Coagulation defect, unspecified: Secondary | ICD-10-CM | POA: Diagnosis not present

## 2017-02-21 DIAGNOSIS — E1122 Type 2 diabetes mellitus with diabetic chronic kidney disease: Secondary | ICD-10-CM | POA: Diagnosis not present

## 2017-02-21 DIAGNOSIS — N186 End stage renal disease: Secondary | ICD-10-CM | POA: Diagnosis not present

## 2017-02-21 DIAGNOSIS — D631 Anemia in chronic kidney disease: Secondary | ICD-10-CM | POA: Diagnosis not present

## 2017-02-24 DIAGNOSIS — D689 Coagulation defect, unspecified: Secondary | ICD-10-CM | POA: Diagnosis not present

## 2017-02-24 DIAGNOSIS — N2581 Secondary hyperparathyroidism of renal origin: Secondary | ICD-10-CM | POA: Diagnosis not present

## 2017-02-24 DIAGNOSIS — Z992 Dependence on renal dialysis: Secondary | ICD-10-CM | POA: Diagnosis not present

## 2017-02-24 DIAGNOSIS — N186 End stage renal disease: Secondary | ICD-10-CM | POA: Diagnosis not present

## 2017-02-24 DIAGNOSIS — D631 Anemia in chronic kidney disease: Secondary | ICD-10-CM | POA: Diagnosis not present

## 2017-02-24 DIAGNOSIS — E1122 Type 2 diabetes mellitus with diabetic chronic kidney disease: Secondary | ICD-10-CM | POA: Diagnosis not present

## 2017-02-25 NOTE — Procedures (Signed)
Patient Name: [name], [name] Date: 01/29/2017 Gender: Female D.O.B: 1955-08-09 Age (years): 66 Referring Provider: Shelva Majestic MD, ABSM Height (inches): 65 Interpreting Physician: Shelva Majestic MD, ABSM Weight (lbs): 330 RPSGT: Laren Everts BMI: 41 MRN: [id number] Neck Size: 20.00  CLINICAL INFORMATION Sleep Study Type: NPSG  Indication for sleep study: Congestive Heart Failure, Diabetes, Hypertension, Obesity, OSA, Snoring, Witnessed Apneas.  She was previously diagnosed with sleep apnea in 2009, with an overall AHI of 12.8/h  and during REM sleep at 43.9/h.  Epworth Sleepiness Score: 14  SLEEP STUDY TECHNIQUE As per the AASM Manual for the Scoring of Sleep and Associated Events v2.3 (April 2016) with a hypopnea requiring 4% desaturations.  The channels recorded and monitored were frontal, central and occipital EEG, electrooculogram (EOG), submentalis EMG (chin), nasal and oral airflow, thoracic and abdominal wall motion, anterior tibialis EMG, snore microphone, electrocardiogram, and pulse oximetry.  MEDICATIONS *    aspirin 81 MG tablet    Take 81 mg by mouth daily.           *    atorvastatin (LIPITOR) 40 MG tablet    Take 40 mg by mouth daily.           *    cinacalcet (SENSIPAR) 60 MG tablet    Take 60 mg by mouth daily.           *    docusate sodium (COLACE) 50 MG capsule    Take 50 mg by mouth 2 (two) times daily.           *    febuxostat (ULORIC) 40 MG tablet    Take 40 mg by mouth daily.           *    FOSRENOL 1000 MG chewable tablet    Chew 1,000 mg by mouth 3 (three) times daily with meals.           *    metoprolol succinate (TOPROL-XL) 50 MG 24 hr tablet    Take 50 mg by mouth daily. DO NOT TAKE THE NIGHT PRIOR TO YOUR DIALYSIS        *    multivitamin (RENA-VIT) TABS tablet    Take 1 tablet by mouth daily.     *    ONETOUCH VERIO test strip    TEST 2 TIMES A DAY AND AS NEEDED FOR BLOOD SUGAR       *    senna-docusate (SENOKOT-S) 8.6-50 MG tablet     Take 2 tablets by mouth 2 (two) times daily.       Medications self-administered by patient taken the night of the study : TRESIBA FLEXTOUCH, [place] The study was initiated at 10:58:47 PM and ended at 4:59:12 AM.  Sleep onset time was 14.2 minutes and the sleep efficiency was 70.1%. The total sleep time was 252.5 minutes.  Stage REM latency was 117.0 minutes.  The patient spent 12.87% of the night in stage N1 sleep, 74.06% in stage N2 sleep, 0.00% in stage N3 and 13.07% in REM.  Alpha intrusion was absent.  Supine sleep was 0.00%.  RESPIRATORY PARAMETERS The overall apnea/hypopnea index (AHI) was 19.7 per hour. The respiratory disturbance index (RDI)  was 25.4/h. There were 1 total apneas, including 1 obstructive, 0 central and 0 mixed apneas. There were 82 hypopneas and 24 RERAs.  The AHI during Stage REM sleep was 43.6 per hour.  AHI while supine was N/A per hour.  The mean oxygen saturation was 87.64%. The minimum SpO2 during sleep was 81.00%.  Moderate snoring was noted during this study.  CARDIAC DATA The 2 lead EKG demonstrated sinus rhythm. The mean heart rate was 83.13 beats per minute. Other EKG findings include: None.  LEG MOVEMENT DATA The total PLMS were 2 with a resulting PLMS index of 0.48. Associated arousal with leg movement index was 0.5 .  IMPRESSIONS - Moderate obstructive sleep apnea overall (AHI 19.7/h); however, sleep apnea was severe during REM sleep with an AHI of 43.6/h. - No significant central sleep apnea occurred during this study (CAI = 0.0/h). - Significant oxygen desaturation to a nadir of 81%. - The patient snored with Moderate snoring volume. - No cardiac abnormalities were noted during this study. - Clinically significant periodic limb movements did not occur during sleep. No significant associated arousals.  DIAGNOSIS - Obstructive Sleep Apnea (327.23 [G47.33 ICD-10]) - Nocturnal Hypoxemia (327.26  [G47.36 ICD-10])  RECOMMENDATIONS - Recommend therapeutic CPAP titration to determine optimal pressure required to alleviate sleep disordered breathing. - Efforts should be made to optimize nasal and oral pharyngeal patency - Avoid alcohol, sedatives and other CNS depressants that may worsen sleep apnea and disrupt normal sleep architecture. - Sleep hygiene should be reviewed to assess factors that may improve sleep quality. - Weight management and regular exercise should be initiated or continued if appropriate.  [Electronically signed] 02/25/2017 06:37 PM  Shelva Majestic MD,FACC, ABSM Diplomate, American Board of Sleep Medicine  NPI: [id number]  [place] PH: [phone]   FX: [phone] [place]

## 2017-02-26 DIAGNOSIS — N186 End stage renal disease: Secondary | ICD-10-CM | POA: Diagnosis not present

## 2017-02-26 DIAGNOSIS — D631 Anemia in chronic kidney disease: Secondary | ICD-10-CM | POA: Diagnosis not present

## 2017-02-26 DIAGNOSIS — Z992 Dependence on renal dialysis: Secondary | ICD-10-CM | POA: Diagnosis not present

## 2017-02-26 DIAGNOSIS — D689 Coagulation defect, unspecified: Secondary | ICD-10-CM | POA: Diagnosis not present

## 2017-02-26 DIAGNOSIS — E1122 Type 2 diabetes mellitus with diabetic chronic kidney disease: Secondary | ICD-10-CM | POA: Diagnosis not present

## 2017-02-26 DIAGNOSIS — N2581 Secondary hyperparathyroidism of renal origin: Secondary | ICD-10-CM | POA: Diagnosis not present

## 2017-02-28 DIAGNOSIS — Z992 Dependence on renal dialysis: Secondary | ICD-10-CM | POA: Diagnosis not present

## 2017-02-28 DIAGNOSIS — D631 Anemia in chronic kidney disease: Secondary | ICD-10-CM | POA: Diagnosis not present

## 2017-02-28 DIAGNOSIS — E1122 Type 2 diabetes mellitus with diabetic chronic kidney disease: Secondary | ICD-10-CM | POA: Diagnosis not present

## 2017-02-28 DIAGNOSIS — N2581 Secondary hyperparathyroidism of renal origin: Secondary | ICD-10-CM | POA: Diagnosis not present

## 2017-02-28 DIAGNOSIS — D689 Coagulation defect, unspecified: Secondary | ICD-10-CM | POA: Diagnosis not present

## 2017-02-28 DIAGNOSIS — N186 End stage renal disease: Secondary | ICD-10-CM | POA: Diagnosis not present

## 2017-03-01 DIAGNOSIS — I129 Hypertensive chronic kidney disease with stage 1 through stage 4 chronic kidney disease, or unspecified chronic kidney disease: Secondary | ICD-10-CM | POA: Diagnosis not present

## 2017-03-01 DIAGNOSIS — N186 End stage renal disease: Secondary | ICD-10-CM | POA: Diagnosis not present

## 2017-03-01 DIAGNOSIS — Z992 Dependence on renal dialysis: Secondary | ICD-10-CM | POA: Diagnosis not present

## 2017-03-04 ENCOUNTER — Encounter: Payer: Self-pay | Admitting: Cardiovascular Disease

## 2017-03-04 ENCOUNTER — Ambulatory Visit (INDEPENDENT_AMBULATORY_CARE_PROVIDER_SITE_OTHER): Payer: Medicare Other | Admitting: Cardiovascular Disease

## 2017-03-04 VITALS — BP 113/66 | HR 79 | Ht 66.0 in | Wt 342.6 lb

## 2017-03-04 DIAGNOSIS — I5032 Chronic diastolic (congestive) heart failure: Secondary | ICD-10-CM | POA: Diagnosis not present

## 2017-03-04 DIAGNOSIS — I10 Essential (primary) hypertension: Secondary | ICD-10-CM | POA: Diagnosis not present

## 2017-03-04 DIAGNOSIS — G4733 Obstructive sleep apnea (adult) (pediatric): Secondary | ICD-10-CM | POA: Diagnosis not present

## 2017-03-04 DIAGNOSIS — N186 End stage renal disease: Secondary | ICD-10-CM

## 2017-03-04 DIAGNOSIS — Z794 Long term (current) use of insulin: Secondary | ICD-10-CM

## 2017-03-04 DIAGNOSIS — Z992 Dependence on renal dialysis: Secondary | ICD-10-CM | POA: Diagnosis not present

## 2017-03-04 DIAGNOSIS — E785 Hyperlipidemia, unspecified: Secondary | ICD-10-CM

## 2017-03-04 DIAGNOSIS — E1121 Type 2 diabetes mellitus with diabetic nephropathy: Secondary | ICD-10-CM | POA: Diagnosis not present

## 2017-03-04 NOTE — Patient Instructions (Signed)
Medication Instructions:  Your physician recommends that you continue on your current medications as directed. Please refer to the Current Medication list given to you today.  Testing/Procedures: Your physician has recommended that you have a CPAP titration study.  Follow-Up: Your physician recommends that you schedule a follow-up appointment in: 2 MONTHS after study with Dr. Claiborne Billings.   Any Other Special Instructions Will Be Listed Below (If Applicable).     If you need a refill on your cardiac medications before your next appointment, please call your pharmacy.

## 2017-03-04 NOTE — Progress Notes (Signed)
OV with Dr. Claiborne Billings 10/3-CPAP titration scheduled.

## 2017-03-04 NOTE — Progress Notes (Signed)
Cardiology Office Note    Date:  03/06/2017   ID:  [name], DOB 1956-02-24, MRN [id number]  PCP:  Deland Pretty, MD  Cardiologist:  Shelva Majestic, MD   No chief complaint on file.   History of Present Illness:  [name] is a 61 y.o. female who referred by Dr. Wilson Singer for me to assume care of her obstructive sleep apnea.  She had establish cardiology care with me in March 2018. She presents for 7 month follow-up cardiology/sleep evaluation.  [name] was seen by Dr. Irish Lack in May 2017.  She has a history of hypertension, hyperlipidemia, obstructive sleep apnea, stage IV renal disease and was not yet on dialysis.  She has had heart failure symptoms and volume overload.  She was admitted to [facility] hospital in May 2017 for acute on chronic heart failure in the setting of worsening renal failure.  During that hospitalization, troponins were elevated.  An echo Doppler study showed an EF of 55-60%, severe LVH, and mild PA hypertension.  She was started on hemodialysis and required AV fistula revision.  She was transfused due to worsening edema and ultimately was hospitalized for close to one month and later discharged to skilled nursing facility.  She was seen in follow-up in the office and was tachycardic.  She was last seen in August 2017 by how Main, PA and was having some mild dizziness after dialysis. She was instructed to hold the night dose of Toprol prior to her dialysis session.  The patient has requested establishment of care with me and presents for evaluation.  She denies any episodes of chest tightness.  She denies recent episodes of dizziness.  Her volume overload has resolved with implementation of dialysis.  She has a history of obstructive sleep apnea and was followed remotely at [facility] by Dr. Shelia Media.  She had undergone a sleep study at [place] in 2009.  Overall AHI was 12.8, but she had severe sleep apnea with rim sleep at 43.9.  Remotely, she  had been set at a 14 cm pressure and with [facility] as her DME.  Her last download was in 2012.  She had not been using CPAP, regular labor recently has increased usage.  Without CPAP therapy.  She would wake up every 2 hours.  Her sleep has improved with reinstitution of treatment.  She typically goes to bed at 11 PM and wakes up at 7 AM.  She is followed by Dr. Florene Glen for renal disease.  When I saw her in March 2018.  She continues to have excessive daytime sleepiness and an Epworth Sleepiness Scale score endorsed at 13.  Epworth Sleepiness Scale: Situation   Chance of Dozing/Sleeping (0 = never , 1 = slight chance , 2 = moderate chance , 3 = high chance )   sitting and reading 2   watching TV 3   sitting inactive in a public place 1   being a passenger in a motor vehicle for an hour or more 1   lying down in the afternoon 2   sitting and talking to someone 1   sitting quietly after lunch (no alcohol) 2   while stopped for a few minutes in traffic as the driver 1   Total Score  13   Since it had been over 9 years since her initial sleep evaluation.  With her continued obesity.  I recommended a new sleep evaluation and machine.  Her recent sleep study was  done on 01/30/2007 which showed moderate sleep apnea were all within AHI of 19.7 per hour.  However, her sleep apnea was very severe doing rim sleep with an HI of 43.6 per hour.  She has significant oxygen desaturation to a nadir of 81% and there was moderate snoring.  CPAP titration was recommended, which has not yet been done.  She is on hemodialysis on Tuesdays, Thursdays and Saturdays.  She has been trying to lose some weight.  She denies recent chest pain.  She presents for evaluation.  Past Medical History:  Diagnosis Date  . Arthritis   . Chronic diastolic CHF (congestive heart failure) ([place])    a. Echo 5/17: severe LVH, EF 55-60%, mild AS, mean AV gradient 10 mmHg, mod LAE, PASP 37 mmHg  . Diabetes mellitus without complication  ([place])    type 2  . ESRD (end stage renal disease) ([facility])    Tues, Thurs, Sat dialysis  . H/O blood clots    many years ago in leg  . History of pneumonia   . HLD (hyperlipidemia)   . Hypertension    BP low after dialysis started, no further BP meds  . Seizures ([place])    had one or two (more than 10-15 years ago)  . Sleep apnea    uses c-pap most of time    Past Surgical History:  Procedure Laterality Date  . ABDOMINAL HYSTERECTOMY    . AV FISTULA PLACEMENT Left 07/25/2015   Procedure: ARTERIOVENOUS (AV) FISTULA CREATION -LEFT BRACHIOCEPHALIC;  Surgeon: Conrad Yabucoa, MD;  Location: [facility];  Service: Vascular;  Laterality: Left;  . COLONOSCOPY    . FISTULA SUPERFICIALIZATION Left 11/01/2015   Procedure: SUPERFICIALIZATION OF LEFT ARM BRACHIOCEPHALIC FISTULA ;  Surgeon: Conrad Belleview, MD;  Location: [facility];  Service: Vascular;  Laterality: Left;  . INSERTION OF DIALYSIS CATHETER Right 10/09/2015   Procedure: INSERTION OF DIALYSIS CATHETER RIGHT INTERNAL JUGULAR ;  Surgeon: Angelia Mould, MD;  Location: [facility] OR;  Service: Vascular;  Laterality: Right;    Current Medications: Outpatient Medications Prior to Visit  Medication Sig Dispense Refill  . aspirin 81 MG tablet Take 81 mg by mouth daily.    Marland Kitchen atorvastatin (LIPITOR) 40 MG tablet Take 40 mg by mouth daily.    . cinacalcet (SENSIPAR) 60 MG tablet Take 60 mg by mouth daily.    Marland Kitchen docusate sodium (COLACE) 50 MG capsule Take 50 mg by mouth 2 (two) times daily.    . febuxostat (ULORIC) 40 MG tablet Take 40 mg by mouth daily.    Marland Kitchen FOSRENOL 1000 MG chewable tablet Chew 1,000 mg by mouth 3 (three) times daily with meals.    . Loratadine (CLARITIN PO) Take 1 tablet by mouth daily.    . metoprolol succinate (TOPROL-XL) 50 MG 24 hr tablet Take 50 mg by mouth daily. DO NOT TAKE THE NIGHT PRIOR TO YOUR DIALYSIS  0  . multivitamin (RENA-VIT) TABS tablet Take 1 tablet by mouth daily. 30 each 0  . ONETOUCH VERIO test strip TEST 2 TIMES A DAY AND  AS NEEDED FOR BLOOD SUGAR  0  . senna-docusate (SENOKOT-S) 8.6-50 MG tablet Take 2 tablets by mouth 2 (two) times daily. 120 tablet 0  . TRESIBA FLEXTOUCH 100 UNIT/ML SOPN FlexTouch Pen Inject 14 Units into the skin daily.  0   No facility-administered medications prior to visit.      Allergies:   Morphine and related   Social History   Social History  .  Marital status: Single    Spouse name: N/A  . Number of children: N/A  . Years of education: N/A   Social History Main Topics  . Smoking status: Never Smoker  . Smokeless tobacco: Never Used  . Alcohol use No  . Drug use: No  . Sexual activity: Not Asked   Other Topics Concern  . None   Social History Narrative   epworth sleepiness scale score 12    I have taken care of her family member, [name]  Family History:  The patient's family history includes Cancer in her mother; Heart attack in her paternal aunt and sister; Hypertension in her brother, brother, father, sister, sister, sister, and sister.   ROS General: Negative; No fevers, chills, or night sweats; positive for super morbid obesity HEENT: Negative; No changes in vision or hearing, sinus congestion, difficulty swallowing Pulmonary: Negative; No cough, wheezing, shortness of breath, hemoptysis Cardiovascular: Negative; No chest pain, presyncope, syncope, palpitations GI: Negative; No nausea, vomiting, diarrhea, or abdominal pain GU: End-stage renal disease, now on dialysis on Tuesdays, Thursdays and Saturdays Musculoskeletal: Negative; no myalgias, joint pain, or weakness Hematologic/Oncology: Negative; no easy bruising, bleeding Endocrine: Positive for diabetes mellitus Neuro: Negative; no changes in balance, headaches Skin: Negative; No rashes or skin lesions Psychiatric: Negative; No behavioral problems, depression Sleep: Positive for OSA documented in 2009.  She had not been using CPAP but has recently restarted using therapy, daytime sleepiness,  hypersomnolence;  no bruxism, restless legs, hypnogognic hallucinations, no cataplexy.  New sleep study was reviewed.  CPAP titration is outstanding Other comprehensive 14 point system review is negative.   PHYSICAL EXAM:   VS:  BP 113/66   Pulse 79   Ht _0  (1.676 m)   Wt (!) 342 lb 9.6 oz (155.4 kg)   BMI 55.30 kg/m     Repeat blood pressure by me was 110/70  Wt Readings from Last 3 Encounters:  03/04/17 (!) 342 lb 9.6 oz (155.4 kg)  01/29/17 (!) 330 lb (149.7 kg)  12/26/16 (!) 342 lb (155.1 kg)    General: Alert, oriented, no distress.  Skin: normal turgor, no rashes, warm and dry HEENT: Normocephalic, atraumatic. Pupils equal round and reactive to light; sclera anicteric; extraocular muscles intact; Nose without nasal septal hypertrophy Mouth/Parynx benign; Mallinpatti scale 3 Neck: No JVD, no carotid bruits; normal carotid upstroke Lungs: clear to ausculatation and percussion; no wheezing or rales Chest wall: without tenderness to palpitation Heart: PMI not displaced, RRR, s1 s2 normal, 1/6 systolic murmur, no diastolic murmur, no rubs, gallops, thrills, or heaves Abdomen: soft, nontender; no hepatosplenomehaly, BS+; abdominal aorta nontender and not dilated by palpation. Back: no CVA tenderness Pulses 2+; left upper extremity AV fistula with palpable thrill Musculoskeletal: full range of motion, normal strength, no joint deformities Extremities: no clubbing cyanosis or edema, Homan's sign negative  Neurologic: grossly nonfocal; Cranial nerves grossly wnl Psychologic: Normal mood and affect    Studies/Labs Reviewed:   ECG (independently read by me): Normal sinus rhythm at 79 bpm..  No ST segment changes.  QTc interval 463 Laura.  June 2018 EKG:  EKG is ordered today. Sinus tachycardia 105 bpm.  QTc interval 460 Laura.  Q wave in lead 3.  PR interval 182 Laura.  March 2018 ECG (independently read by me): Normal sinus rhythm at 75 bpm.  Nonspecific ST changes.  Normal  intervals.  Recent Labs: BMP Latest Ref Rng & Units 11/06/2015 11/03/2015 11/01/2015  Glucose 65 - 99 mg/dL 97 87  80  BUN 6 - 20 mg/dL 25(H) 20 -  Creatinine 0.44 - 1.00 mg/dL 8.13(H) 7.41(H) -  Sodium 135 - 145 mmol/L 135 136 132(L)  Potassium 3.5 - 5.1 mmol/L 4.3 3.8 4.2  Chloride 101 - 111 mmol/L 96(L) 97(L) -  CO2 22 - 32 mmol/L 30 28 -  Calcium 8.9 - 10.3 mg/dL 8.2(L) 8.3(L) -     Hepatic Function Latest Ref Rng & Units 11/06/2015 11/03/2015 10/30/2015  Total Protein 6.5 - 8.1 g/dL - - -  Albumin 3.5 - 5.0 g/dL 2.6(L) 2.6(L) 2.6(L)  AST 15 - 41 U/L - - -  ALT 14 - 54 U/L - - -  Alk Phosphatase 38 - 126 U/L - - -  Total Bilirubin 0.3 - 1.2 mg/dL - - -    CBC Latest Ref Rng & Units 11/06/2015 11/03/2015 11/01/2015  WBC 4.0 - 10.5 K/uL 6.2 7.0 -  Hemoglobin 12.0 - 15.0 g/dL 10.0(L) 10.0(L) 12.9  Hematocrit 36.0 - 46.0 % 33.4(L) 32.7(L) 38.0  Platelets 150 - 400 K/uL 245 199 -   Lab Results  Component Value Date   MCV 89.8 11/06/2015   MCV 88.9 11/03/2015   MCV 90.3 10/30/2015   Lab Results  Component Value Date   TSH 1.541 10/07/2015   Lab Results  Component Value Date   HGBA1C 6.2 (H) 10/07/2015     BNP    Component Value Date/Time   BNP 464.4 (H) 10/04/2015 2319    ProBNP No results found for: PROBNP   Lipid Panel  No results found for: CHOL, TRIG, HDL, CHOLHDL, VLDL, LDLCALC, LDLDIRECT   RADIOLOGY: No results found.   Additional studies/ records that were reviewed today include:  I have reviewed the office notes from her prior providers.  I reviewed her hospitalization.  I reviewed laboratory.  I have reviewed her sleep study from [facility] and the notes of Dr. Gareth Eagle   ASSESSMENT:    1. OSA (obstructive sleep apnea)   2. Chronic diastolic CHF (congestive heart failure) ([place])   3. Essential hypertension   4. Morbid obesity, unspecified obesity type ([place])   5. ESRD on dialysis ([place])   6. Hyperlipidemia with target LDL less than 70     7. Type 2 diabetes mellitus with diabetic nephropathy, with long-term current use of insulin [facility])      PLAN:  [name] is a 61 year old female who is a history of hypertension, hyperlipidemia, obstructive sleep apnea, Super morbid obesity and end-stage renal disease.  She has been on dialysis since May 2017 and currently going for dialysis on Tuesdays, Thursdays and Saturdays.  Prior to that, she had issues with significant volume overload and acute on chronic diastolic heart failure.  Since being on dialysis, her volume status has stabilized.  She transiently had had issues with dialysis and her medications have been reduced.  When I initially saw her, her blood pressure was low and she was tachycardic.  Pressure today is stable and resting pulse is now normal.  She underwent a repeat sleep evaluation to reassess the severity of her sleep apnea.  This confirmed moderate sleep apnea overall, but very severe sleep apnea with REM sleep.  There was significant oxygen desaturation to 81%.  CPAP titration was recommended, which has not yet been done.  She denies any chest pain.  She denies PND, orthopnea.  She denies orthostatic symptoms but rarely.  Her blood pressure does drop during dialysis.  She will undergo her CPAP titration  and we will then prescribe a new CPAP machine.  I will see her in follow-up in 3-4 months for reevaluation and assessment of compliance.  She continues to be on atorvastatin 40 mg daily for hyperlipidemia with target LDL less than 70.  She is diabetic on Tresiba.   Medication Adjustments/Labs and Tests Ordered: Current medicines are reviewed at length with the patient today.  Concerns regarding medicines are outlined above.  Medication changes, Labs and Tests ordered today are listed in the Patient Instructions below. Patient Instructions  Medication Instructions:  Your physician recommends that you continue on your current medications as directed. Please refer to the Current  Medication list given to you today.  Testing/Procedures: Your physician has recommended that you have a CPAP titration study.  Follow-Up: Your physician recommends that you schedule a follow-up appointment in: 2 MONTHS after study with Dr. Claiborne Billings.   Any Other Special Instructions Will Be Listed Below (If Applicable).     If you need a refill on your cardiac medications before your next appointment, please call your pharmacy.     Signed, Shelva Majestic, MD, [facility] 03/06/2017 4:47 PM    [place] [address] [place], [place], [place]  [address] Phone: [phone]

## 2017-04-26 ENCOUNTER — Ambulatory Visit (HOSPITAL_BASED_OUTPATIENT_CLINIC_OR_DEPARTMENT_OTHER): Payer: Medicare Other | Attending: Cardiovascular Disease | Admitting: Cardiovascular Disease

## 2017-04-26 VITALS — Ht 66.0 in | Wt 342.0 lb

## 2017-04-26 DIAGNOSIS — G4733 Obstructive sleep apnea (adult) (pediatric): Secondary | ICD-10-CM | POA: Diagnosis not present

## 2017-04-26 DIAGNOSIS — I5032 Chronic diastolic (congestive) heart failure: Secondary | ICD-10-CM

## 2017-04-26 DIAGNOSIS — Z79899 Other long term (current) drug therapy: Secondary | ICD-10-CM | POA: Diagnosis not present

## 2017-04-26 DIAGNOSIS — Z7982 Long term (current) use of aspirin: Secondary | ICD-10-CM | POA: Insufficient documentation

## 2017-04-26 DIAGNOSIS — I10 Essential (primary) hypertension: Secondary | ICD-10-CM

## 2017-04-27 ENCOUNTER — Encounter (HOSPITAL_BASED_OUTPATIENT_CLINIC_OR_DEPARTMENT_OTHER): Payer: Self-pay

## 2017-05-02 DIAGNOSIS — D689 Coagulation defect, unspecified: Secondary | ICD-10-CM | POA: Diagnosis not present

## 2017-05-02 DIAGNOSIS — Z992 Dependence on renal dialysis: Secondary | ICD-10-CM | POA: Diagnosis not present

## 2017-05-02 DIAGNOSIS — N186 End stage renal disease: Secondary | ICD-10-CM | POA: Diagnosis not present

## 2017-05-02 DIAGNOSIS — N2581 Secondary hyperparathyroidism of renal origin: Secondary | ICD-10-CM | POA: Diagnosis not present

## 2017-05-02 DIAGNOSIS — E1122 Type 2 diabetes mellitus with diabetic chronic kidney disease: Secondary | ICD-10-CM | POA: Diagnosis not present

## 2017-05-05 DIAGNOSIS — N2581 Secondary hyperparathyroidism of renal origin: Secondary | ICD-10-CM | POA: Diagnosis not present

## 2017-05-05 DIAGNOSIS — E1122 Type 2 diabetes mellitus with diabetic chronic kidney disease: Secondary | ICD-10-CM | POA: Diagnosis not present

## 2017-05-05 DIAGNOSIS — D689 Coagulation defect, unspecified: Secondary | ICD-10-CM | POA: Diagnosis not present

## 2017-05-05 DIAGNOSIS — Z992 Dependence on renal dialysis: Secondary | ICD-10-CM | POA: Diagnosis not present

## 2017-05-05 DIAGNOSIS — N186 End stage renal disease: Secondary | ICD-10-CM | POA: Diagnosis not present

## 2017-05-06 DIAGNOSIS — Z0000 Encounter for general adult medical examination without abnormal findings: Secondary | ICD-10-CM | POA: Diagnosis not present

## 2017-05-06 DIAGNOSIS — E789 Disorder of lipoprotein metabolism, unspecified: Secondary | ICD-10-CM | POA: Diagnosis not present

## 2017-05-06 DIAGNOSIS — E118 Type 2 diabetes mellitus with unspecified complications: Secondary | ICD-10-CM | POA: Diagnosis not present

## 2017-05-07 DIAGNOSIS — N186 End stage renal disease: Secondary | ICD-10-CM | POA: Diagnosis not present

## 2017-05-07 DIAGNOSIS — Z992 Dependence on renal dialysis: Secondary | ICD-10-CM | POA: Diagnosis not present

## 2017-05-07 DIAGNOSIS — E1122 Type 2 diabetes mellitus with diabetic chronic kidney disease: Secondary | ICD-10-CM | POA: Diagnosis not present

## 2017-05-07 DIAGNOSIS — N2581 Secondary hyperparathyroidism of renal origin: Secondary | ICD-10-CM | POA: Diagnosis not present

## 2017-05-07 DIAGNOSIS — D689 Coagulation defect, unspecified: Secondary | ICD-10-CM | POA: Diagnosis not present

## 2017-05-09 DIAGNOSIS — N2581 Secondary hyperparathyroidism of renal origin: Secondary | ICD-10-CM | POA: Diagnosis not present

## 2017-05-09 DIAGNOSIS — D689 Coagulation defect, unspecified: Secondary | ICD-10-CM | POA: Diagnosis not present

## 2017-05-09 DIAGNOSIS — Z992 Dependence on renal dialysis: Secondary | ICD-10-CM | POA: Diagnosis not present

## 2017-05-09 DIAGNOSIS — E1122 Type 2 diabetes mellitus with diabetic chronic kidney disease: Secondary | ICD-10-CM | POA: Diagnosis not present

## 2017-05-09 DIAGNOSIS — N186 End stage renal disease: Secondary | ICD-10-CM | POA: Diagnosis not present

## 2017-05-13 DIAGNOSIS — D689 Coagulation defect, unspecified: Secondary | ICD-10-CM | POA: Diagnosis not present

## 2017-05-13 DIAGNOSIS — N186 End stage renal disease: Secondary | ICD-10-CM | POA: Diagnosis not present

## 2017-05-13 DIAGNOSIS — E1122 Type 2 diabetes mellitus with diabetic chronic kidney disease: Secondary | ICD-10-CM | POA: Diagnosis not present

## 2017-05-13 DIAGNOSIS — N2581 Secondary hyperparathyroidism of renal origin: Secondary | ICD-10-CM | POA: Diagnosis not present

## 2017-05-13 DIAGNOSIS — Z992 Dependence on renal dialysis: Secondary | ICD-10-CM | POA: Diagnosis not present

## 2017-05-14 DIAGNOSIS — E1122 Type 2 diabetes mellitus with diabetic chronic kidney disease: Secondary | ICD-10-CM | POA: Diagnosis not present

## 2017-05-14 DIAGNOSIS — Z992 Dependence on renal dialysis: Secondary | ICD-10-CM | POA: Diagnosis not present

## 2017-05-14 DIAGNOSIS — D689 Coagulation defect, unspecified: Secondary | ICD-10-CM | POA: Diagnosis not present

## 2017-05-14 DIAGNOSIS — N186 End stage renal disease: Secondary | ICD-10-CM | POA: Diagnosis not present

## 2017-05-14 DIAGNOSIS — N2581 Secondary hyperparathyroidism of renal origin: Secondary | ICD-10-CM | POA: Diagnosis not present

## 2017-05-16 ENCOUNTER — Encounter (HOSPITAL_BASED_OUTPATIENT_CLINIC_OR_DEPARTMENT_OTHER): Payer: Self-pay | Admitting: Cardiovascular Disease

## 2017-05-16 DIAGNOSIS — E1122 Type 2 diabetes mellitus with diabetic chronic kidney disease: Secondary | ICD-10-CM | POA: Diagnosis not present

## 2017-05-16 DIAGNOSIS — N186 End stage renal disease: Secondary | ICD-10-CM | POA: Diagnosis not present

## 2017-05-16 DIAGNOSIS — D689 Coagulation defect, unspecified: Secondary | ICD-10-CM | POA: Diagnosis not present

## 2017-05-16 DIAGNOSIS — N2581 Secondary hyperparathyroidism of renal origin: Secondary | ICD-10-CM | POA: Diagnosis not present

## 2017-05-16 DIAGNOSIS — Z992 Dependence on renal dialysis: Secondary | ICD-10-CM | POA: Diagnosis not present

## 2017-05-16 NOTE — Procedures (Signed)
Patient Name: [name], [name] Date: 04/26/2017 Gender: Female D.O.B: 07-17-1955 Age (years): 44 Referring Provider: Shelva Majestic MD, ABSM Height (inches): 66 Interpreting Physician: Shelva Majestic MD, ABSM Weight (lbs): 330 RPSGT: Zadie Rhine BMI: 53 MRN: [id number] Neck Size: 18.00  CLINICAL INFORMATION The patient is referred for a CPAP titration to treat sleep apnea.  Date of NPSG, Split Night or HST: 01/29/2017: AHI 19.7/h; RDI 25.4/h; AHI during REM sleep 43.6/h: oxygen desaturation to a nadir of 81%.  SLEEP STUDY TECHNIQUE As per the AASM Manual for the Scoring of Sleep and Associated Events v2.3 (April 2016) with a hypopnea requiring 4% desaturations.  The channels recorded and monitored were frontal, central and occipital EEG, electrooculogram (EOG), submentalis EMG (chin), nasal and oral airflow, thoracic and abdominal wall motion, anterior tibialis EMG, snore microphone, electrocardiogram, and pulse oximetry. Continuous positive airway pressure (CPAP) was initiated at the beginning of the study and titrated to treat sleep-disordered breathing.  MEDICATIONS *    aspirin 81 MG tablet    Take 81 mg by mouth daily.           *    atorvastatin (LIPITOR) 40 MG tablet    Take 40 mg by mouth daily.           *    cinacalcet (SENSIPAR) 60 MG tablet    Take 60 mg by mouth daily.           *    docusate sodium (COLACE) 50 MG capsule    Take 50 mg by mouth 2 (two) times daily.           *    febuxostat (ULORIC) 40 MG tablet    Take 40 mg by mouth daily.           *    FOSRENOL 1000 MG chewable tablet    Chew 1,000 mg by mouth 3 (three) times daily with meals.           *    metoprolol succinate (TOPROL-XL) 50 MG 24 hr tablet    Take 50 mg by mouth daily. DO NOT TAKE THE NIGHT PRIOR TO YOUR DIALYSIS        *    multivitamin (RENA-VIT) TABS tablet    Take 1 tablet by mouth daily.     *    ONETOUCH VERIO test strip    TEST 2 TIMES A DAY AND AS NEEDED FOR BLOOD SUGAR       *     senna-docusate (SENOKOT-S) 8.6-50 MG tablet    Take 2 tablets by mouth 2 (two) times daily.       Medications self-administered by patient taken the night of the study : TRESIBA FLEXTOUCH, [place] Comments added by technician: NO RESTROOM VISTED. O2 initiated due to low sats.   RESPIRATORY PARAMETERS Optimal PAP Pressure (cm): 9 AHI at Optimal Pressure (/hr): 0.0 Overall Minimal O2 (%): 84.00 Supine % at Optimal Pressure (%): 0 Minimal O2 at Optimal Pressure (%): 85.0    SLEEP ARCHITECTURE The study was initiated at 9:56:03 PM and ended at 4:15:13 AM.  Sleep onset time was 9.5 minutes and the sleep efficiency was 88.2%. The total sleep time was 334.5 minutes.  The patient spent 2.99% of the night in stage N1 sleep, 67.41% in stage N2 sleep, 15.70% in stage N3 and 13.90% in REM.Stage REM latency was 140.0 minutes  Wake after sleep onset was 35.2. Alpha intrusion was  absent. Supine sleep was 0.00%.  CARDIAC DATA The 2 lead EKG demonstrated sinus rhythm. The mean heart rate was 77.23 beats per minute. Other EKG findings include: None.  LEG MOVEMENT DATA The total Periodic Limb Movements of Sleep (PLMS) were 40. The PLMS index was 7.17. A PLMS index of <15 is considered normal in adults.  IMPRESSIONS - CPAP was initiated at 5 cm and was titrated to optimal pressure at 9 cm of water. AHI at 9 cm water pressure was 0. - Central sleep apnea was not noted during this titration (CAI = 0.4/h). - Moderate oxygen desaturations to a nadir of 84% at 5 cm water pressure.  Due to low oxygen saturation supplemental oxygen at 2 L/m was implemented. - No snoring was audible during this study. - No cardiac abnormalities were observed during this study. - Mild periodic limb movements were observed during this study. Arousals associated with PLMs were rare.  DIAGNOSIS - Obstructive Sleep Apnea (327.23 [G47.33 ICD-10])   RECOMMENDATIONS - Recommend an initial   trial of CPAP therapy with EPR of 3 at 9 cm H2O with heated humidification.  A Medium size Resmed Full Face Mask AirFit F20 mask was used for the titration.  Supplemental oxygen was implemented due to low saturations in the 80s. - Effort should be made to optimize nasal and oral pharyngeal patency. - Avoid alcohol, sedatives and other CNS depressants that may worsen sleep apnea and disrupt normal sleep architecture. - Sleep hygiene should be reviewed to assess factors that may improve sleep quality. - Weight management and regular exercise should be initiated. - Recommend a download be obtained in 30 days and sleep clinic evaluation after 4 weeks of therapy   [Electronically signed] 05/16/2017 10:30 AM  Shelva Majestic MD, [facility], ABSM Diplomate, American Board of Sleep Medicine   NPI: [id number] [place] PH: [phone]   FX: [phone] [place]

## 2017-05-18 ENCOUNTER — Telehealth: Payer: Self-pay | Admitting: *Deleted

## 2017-05-18 NOTE — Telephone Encounter (Signed)
-----   Message from Troy Sine, MD sent at 05/16/2017 10:37 AM EST ----- [name]: Please notify patient the results and set up with DME company to initiate CPAP therapy with supplemental O2.

## 2017-05-18 NOTE — Progress Notes (Signed)
See telephone note 12/17-orders faxed to [facility], patient aware.

## 2017-05-18 NOTE — Telephone Encounter (Signed)
Orders faxed to [facility] for CPAP with supplemental O2.  Patient aware and verbalized understanding.    Advised to call back if not contacted within 2 weeks by DME company.   Patient has OV 1/30, will keep unless not set up by 12/30.

## 2017-05-19 DIAGNOSIS — N2581 Secondary hyperparathyroidism of renal origin: Secondary | ICD-10-CM | POA: Diagnosis not present

## 2017-05-19 DIAGNOSIS — N186 End stage renal disease: Secondary | ICD-10-CM | POA: Diagnosis not present

## 2017-05-19 DIAGNOSIS — E1122 Type 2 diabetes mellitus with diabetic chronic kidney disease: Secondary | ICD-10-CM | POA: Diagnosis not present

## 2017-05-19 DIAGNOSIS — D689 Coagulation defect, unspecified: Secondary | ICD-10-CM | POA: Diagnosis not present

## 2017-05-19 DIAGNOSIS — Z992 Dependence on renal dialysis: Secondary | ICD-10-CM | POA: Diagnosis not present

## 2017-05-21 DIAGNOSIS — Z992 Dependence on renal dialysis: Secondary | ICD-10-CM | POA: Diagnosis not present

## 2017-05-21 DIAGNOSIS — E1122 Type 2 diabetes mellitus with diabetic chronic kidney disease: Secondary | ICD-10-CM | POA: Diagnosis not present

## 2017-05-21 DIAGNOSIS — N186 End stage renal disease: Secondary | ICD-10-CM | POA: Diagnosis not present

## 2017-05-21 DIAGNOSIS — N2581 Secondary hyperparathyroidism of renal origin: Secondary | ICD-10-CM | POA: Diagnosis not present

## 2017-05-21 DIAGNOSIS — D689 Coagulation defect, unspecified: Secondary | ICD-10-CM | POA: Diagnosis not present

## 2017-05-23 DIAGNOSIS — N2581 Secondary hyperparathyroidism of renal origin: Secondary | ICD-10-CM | POA: Diagnosis not present

## 2017-05-23 DIAGNOSIS — N186 End stage renal disease: Secondary | ICD-10-CM | POA: Diagnosis not present

## 2017-05-23 DIAGNOSIS — Z992 Dependence on renal dialysis: Secondary | ICD-10-CM | POA: Diagnosis not present

## 2017-05-23 DIAGNOSIS — D689 Coagulation defect, unspecified: Secondary | ICD-10-CM | POA: Diagnosis not present

## 2017-05-23 DIAGNOSIS — E1122 Type 2 diabetes mellitus with diabetic chronic kidney disease: Secondary | ICD-10-CM | POA: Diagnosis not present

## 2017-05-25 DIAGNOSIS — Z992 Dependence on renal dialysis: Secondary | ICD-10-CM | POA: Diagnosis not present

## 2017-05-25 DIAGNOSIS — E1122 Type 2 diabetes mellitus with diabetic chronic kidney disease: Secondary | ICD-10-CM | POA: Diagnosis not present

## 2017-05-25 DIAGNOSIS — D689 Coagulation defect, unspecified: Secondary | ICD-10-CM | POA: Diagnosis not present

## 2017-05-25 DIAGNOSIS — N2581 Secondary hyperparathyroidism of renal origin: Secondary | ICD-10-CM | POA: Diagnosis not present

## 2017-05-25 DIAGNOSIS — N186 End stage renal disease: Secondary | ICD-10-CM | POA: Diagnosis not present

## 2017-05-28 DIAGNOSIS — E1122 Type 2 diabetes mellitus with diabetic chronic kidney disease: Secondary | ICD-10-CM | POA: Diagnosis not present

## 2017-05-28 DIAGNOSIS — N2581 Secondary hyperparathyroidism of renal origin: Secondary | ICD-10-CM | POA: Diagnosis not present

## 2017-05-28 DIAGNOSIS — N186 End stage renal disease: Secondary | ICD-10-CM | POA: Diagnosis not present

## 2017-05-28 DIAGNOSIS — Z992 Dependence on renal dialysis: Secondary | ICD-10-CM | POA: Diagnosis not present

## 2017-05-28 DIAGNOSIS — D689 Coagulation defect, unspecified: Secondary | ICD-10-CM | POA: Diagnosis not present

## 2017-05-30 DIAGNOSIS — N2581 Secondary hyperparathyroidism of renal origin: Secondary | ICD-10-CM | POA: Diagnosis not present

## 2017-05-30 DIAGNOSIS — D689 Coagulation defect, unspecified: Secondary | ICD-10-CM | POA: Diagnosis not present

## 2017-05-30 DIAGNOSIS — N186 End stage renal disease: Secondary | ICD-10-CM | POA: Diagnosis not present

## 2017-05-30 DIAGNOSIS — Z992 Dependence on renal dialysis: Secondary | ICD-10-CM | POA: Diagnosis not present

## 2017-05-30 DIAGNOSIS — E1122 Type 2 diabetes mellitus with diabetic chronic kidney disease: Secondary | ICD-10-CM | POA: Diagnosis not present

## 2017-06-01 DIAGNOSIS — N2581 Secondary hyperparathyroidism of renal origin: Secondary | ICD-10-CM | POA: Diagnosis not present

## 2017-06-01 DIAGNOSIS — I129 Hypertensive chronic kidney disease with stage 1 through stage 4 chronic kidney disease, or unspecified chronic kidney disease: Secondary | ICD-10-CM | POA: Diagnosis not present

## 2017-06-01 DIAGNOSIS — D689 Coagulation defect, unspecified: Secondary | ICD-10-CM | POA: Diagnosis not present

## 2017-06-01 DIAGNOSIS — Z992 Dependence on renal dialysis: Secondary | ICD-10-CM | POA: Diagnosis not present

## 2017-06-01 DIAGNOSIS — E1122 Type 2 diabetes mellitus with diabetic chronic kidney disease: Secondary | ICD-10-CM | POA: Diagnosis not present

## 2017-06-01 DIAGNOSIS — N186 End stage renal disease: Secondary | ICD-10-CM | POA: Diagnosis not present

## 2017-06-04 DIAGNOSIS — N186 End stage renal disease: Secondary | ICD-10-CM | POA: Diagnosis not present

## 2017-06-04 DIAGNOSIS — E1122 Type 2 diabetes mellitus with diabetic chronic kidney disease: Secondary | ICD-10-CM | POA: Diagnosis not present

## 2017-06-04 DIAGNOSIS — N2581 Secondary hyperparathyroidism of renal origin: Secondary | ICD-10-CM | POA: Diagnosis not present

## 2017-06-04 DIAGNOSIS — D689 Coagulation defect, unspecified: Secondary | ICD-10-CM | POA: Diagnosis not present

## 2017-06-06 DIAGNOSIS — N186 End stage renal disease: Secondary | ICD-10-CM | POA: Diagnosis not present

## 2017-06-06 DIAGNOSIS — D689 Coagulation defect, unspecified: Secondary | ICD-10-CM | POA: Diagnosis not present

## 2017-06-06 DIAGNOSIS — E1122 Type 2 diabetes mellitus with diabetic chronic kidney disease: Secondary | ICD-10-CM | POA: Diagnosis not present

## 2017-06-06 DIAGNOSIS — N2581 Secondary hyperparathyroidism of renal origin: Secondary | ICD-10-CM | POA: Diagnosis not present

## 2017-06-08 DIAGNOSIS — Z992 Dependence on renal dialysis: Secondary | ICD-10-CM | POA: Diagnosis not present

## 2017-06-08 DIAGNOSIS — I871 Compression of vein: Secondary | ICD-10-CM | POA: Diagnosis not present

## 2017-06-08 DIAGNOSIS — N186 End stage renal disease: Secondary | ICD-10-CM | POA: Diagnosis not present

## 2017-06-08 DIAGNOSIS — T82858A Stenosis of vascular prosthetic devices, implants and grafts, initial encounter: Secondary | ICD-10-CM | POA: Diagnosis not present

## 2017-06-09 DIAGNOSIS — D689 Coagulation defect, unspecified: Secondary | ICD-10-CM | POA: Diagnosis not present

## 2017-06-09 DIAGNOSIS — N2581 Secondary hyperparathyroidism of renal origin: Secondary | ICD-10-CM | POA: Diagnosis not present

## 2017-06-09 DIAGNOSIS — N186 End stage renal disease: Secondary | ICD-10-CM | POA: Diagnosis not present

## 2017-06-09 DIAGNOSIS — E1122 Type 2 diabetes mellitus with diabetic chronic kidney disease: Secondary | ICD-10-CM | POA: Diagnosis not present

## 2017-06-11 DIAGNOSIS — N2581 Secondary hyperparathyroidism of renal origin: Secondary | ICD-10-CM | POA: Diagnosis not present

## 2017-06-11 DIAGNOSIS — N186 End stage renal disease: Secondary | ICD-10-CM | POA: Diagnosis not present

## 2017-06-11 DIAGNOSIS — E1122 Type 2 diabetes mellitus with diabetic chronic kidney disease: Secondary | ICD-10-CM | POA: Diagnosis not present

## 2017-06-11 DIAGNOSIS — D689 Coagulation defect, unspecified: Secondary | ICD-10-CM | POA: Diagnosis not present

## 2017-06-13 DIAGNOSIS — N2581 Secondary hyperparathyroidism of renal origin: Secondary | ICD-10-CM | POA: Diagnosis not present

## 2017-06-13 DIAGNOSIS — E1122 Type 2 diabetes mellitus with diabetic chronic kidney disease: Secondary | ICD-10-CM | POA: Diagnosis not present

## 2017-06-13 DIAGNOSIS — D689 Coagulation defect, unspecified: Secondary | ICD-10-CM | POA: Diagnosis not present

## 2017-06-13 DIAGNOSIS — N186 End stage renal disease: Secondary | ICD-10-CM | POA: Diagnosis not present

## 2017-06-15 DIAGNOSIS — G4733 Obstructive sleep apnea (adult) (pediatric): Secondary | ICD-10-CM | POA: Diagnosis not present

## 2017-06-16 DIAGNOSIS — N2581 Secondary hyperparathyroidism of renal origin: Secondary | ICD-10-CM | POA: Diagnosis not present

## 2017-06-16 DIAGNOSIS — E1122 Type 2 diabetes mellitus with diabetic chronic kidney disease: Secondary | ICD-10-CM | POA: Diagnosis not present

## 2017-06-16 DIAGNOSIS — N186 End stage renal disease: Secondary | ICD-10-CM | POA: Diagnosis not present

## 2017-06-16 DIAGNOSIS — D689 Coagulation defect, unspecified: Secondary | ICD-10-CM | POA: Diagnosis not present

## 2017-06-18 DIAGNOSIS — D689 Coagulation defect, unspecified: Secondary | ICD-10-CM | POA: Diagnosis not present

## 2017-06-18 DIAGNOSIS — N186 End stage renal disease: Secondary | ICD-10-CM | POA: Diagnosis not present

## 2017-06-18 DIAGNOSIS — N2581 Secondary hyperparathyroidism of renal origin: Secondary | ICD-10-CM | POA: Diagnosis not present

## 2017-06-18 DIAGNOSIS — E1122 Type 2 diabetes mellitus with diabetic chronic kidney disease: Secondary | ICD-10-CM | POA: Diagnosis not present

## 2017-06-20 DIAGNOSIS — E1122 Type 2 diabetes mellitus with diabetic chronic kidney disease: Secondary | ICD-10-CM | POA: Diagnosis not present

## 2017-06-20 DIAGNOSIS — N186 End stage renal disease: Secondary | ICD-10-CM | POA: Diagnosis not present

## 2017-06-20 DIAGNOSIS — D689 Coagulation defect, unspecified: Secondary | ICD-10-CM | POA: Diagnosis not present

## 2017-06-20 DIAGNOSIS — N2581 Secondary hyperparathyroidism of renal origin: Secondary | ICD-10-CM | POA: Diagnosis not present

## 2017-06-23 DIAGNOSIS — E1122 Type 2 diabetes mellitus with diabetic chronic kidney disease: Secondary | ICD-10-CM | POA: Diagnosis not present

## 2017-06-23 DIAGNOSIS — N2581 Secondary hyperparathyroidism of renal origin: Secondary | ICD-10-CM | POA: Diagnosis not present

## 2017-06-23 DIAGNOSIS — N186 End stage renal disease: Secondary | ICD-10-CM | POA: Diagnosis not present

## 2017-06-23 DIAGNOSIS — D689 Coagulation defect, unspecified: Secondary | ICD-10-CM | POA: Diagnosis not present

## 2017-06-25 DIAGNOSIS — N2581 Secondary hyperparathyroidism of renal origin: Secondary | ICD-10-CM | POA: Diagnosis not present

## 2017-06-25 DIAGNOSIS — E1122 Type 2 diabetes mellitus with diabetic chronic kidney disease: Secondary | ICD-10-CM | POA: Diagnosis not present

## 2017-06-25 DIAGNOSIS — N186 End stage renal disease: Secondary | ICD-10-CM | POA: Diagnosis not present

## 2017-06-25 DIAGNOSIS — D689 Coagulation defect, unspecified: Secondary | ICD-10-CM | POA: Diagnosis not present

## 2017-06-26 DIAGNOSIS — Z992 Dependence on renal dialysis: Secondary | ICD-10-CM | POA: Diagnosis not present

## 2017-06-26 DIAGNOSIS — Z0000 Encounter for general adult medical examination without abnormal findings: Secondary | ICD-10-CM | POA: Diagnosis not present

## 2017-06-27 DIAGNOSIS — E1122 Type 2 diabetes mellitus with diabetic chronic kidney disease: Secondary | ICD-10-CM | POA: Diagnosis not present

## 2017-06-27 DIAGNOSIS — D689 Coagulation defect, unspecified: Secondary | ICD-10-CM | POA: Diagnosis not present

## 2017-06-27 DIAGNOSIS — N186 End stage renal disease: Secondary | ICD-10-CM | POA: Diagnosis not present

## 2017-06-27 DIAGNOSIS — N2581 Secondary hyperparathyroidism of renal origin: Secondary | ICD-10-CM | POA: Diagnosis not present

## 2017-06-30 DIAGNOSIS — D689 Coagulation defect, unspecified: Secondary | ICD-10-CM | POA: Diagnosis not present

## 2017-06-30 DIAGNOSIS — N186 End stage renal disease: Secondary | ICD-10-CM | POA: Diagnosis not present

## 2017-06-30 DIAGNOSIS — N2581 Secondary hyperparathyroidism of renal origin: Secondary | ICD-10-CM | POA: Diagnosis not present

## 2017-06-30 DIAGNOSIS — E1122 Type 2 diabetes mellitus with diabetic chronic kidney disease: Secondary | ICD-10-CM | POA: Diagnosis not present

## 2017-07-01 ENCOUNTER — Ambulatory Visit: Payer: Self-pay | Admitting: Cardiovascular Disease

## 2017-07-02 ENCOUNTER — Other Ambulatory Visit: Payer: Self-pay | Admitting: Internal Medicine

## 2017-07-02 DIAGNOSIS — D689 Coagulation defect, unspecified: Secondary | ICD-10-CM | POA: Diagnosis not present

## 2017-07-02 DIAGNOSIS — Z992 Dependence on renal dialysis: Secondary | ICD-10-CM | POA: Diagnosis not present

## 2017-07-02 DIAGNOSIS — I129 Hypertensive chronic kidney disease with stage 1 through stage 4 chronic kidney disease, or unspecified chronic kidney disease: Secondary | ICD-10-CM | POA: Diagnosis not present

## 2017-07-02 DIAGNOSIS — R748 Abnormal levels of other serum enzymes: Secondary | ICD-10-CM

## 2017-07-02 DIAGNOSIS — E1122 Type 2 diabetes mellitus with diabetic chronic kidney disease: Secondary | ICD-10-CM | POA: Diagnosis not present

## 2017-07-02 DIAGNOSIS — N2581 Secondary hyperparathyroidism of renal origin: Secondary | ICD-10-CM | POA: Diagnosis not present

## 2017-07-02 DIAGNOSIS — N186 End stage renal disease: Secondary | ICD-10-CM | POA: Diagnosis not present

## 2017-07-03 DIAGNOSIS — N186 End stage renal disease: Secondary | ICD-10-CM | POA: Diagnosis not present

## 2017-07-03 DIAGNOSIS — I129 Hypertensive chronic kidney disease with stage 1 through stage 4 chronic kidney disease, or unspecified chronic kidney disease: Secondary | ICD-10-CM | POA: Diagnosis not present

## 2017-07-03 DIAGNOSIS — Z992 Dependence on renal dialysis: Secondary | ICD-10-CM | POA: Diagnosis not present

## 2017-07-04 DIAGNOSIS — D631 Anemia in chronic kidney disease: Secondary | ICD-10-CM | POA: Diagnosis not present

## 2017-07-04 DIAGNOSIS — Z992 Dependence on renal dialysis: Secondary | ICD-10-CM | POA: Diagnosis not present

## 2017-07-04 DIAGNOSIS — D689 Coagulation defect, unspecified: Secondary | ICD-10-CM | POA: Diagnosis not present

## 2017-07-04 DIAGNOSIS — E1122 Type 2 diabetes mellitus with diabetic chronic kidney disease: Secondary | ICD-10-CM | POA: Diagnosis not present

## 2017-07-04 DIAGNOSIS — N186 End stage renal disease: Secondary | ICD-10-CM | POA: Diagnosis not present

## 2017-07-04 DIAGNOSIS — N2581 Secondary hyperparathyroidism of renal origin: Secondary | ICD-10-CM | POA: Diagnosis not present

## 2017-07-07 DIAGNOSIS — N2581 Secondary hyperparathyroidism of renal origin: Secondary | ICD-10-CM | POA: Diagnosis not present

## 2017-07-07 DIAGNOSIS — D631 Anemia in chronic kidney disease: Secondary | ICD-10-CM | POA: Diagnosis not present

## 2017-07-07 DIAGNOSIS — Z992 Dependence on renal dialysis: Secondary | ICD-10-CM | POA: Diagnosis not present

## 2017-07-07 DIAGNOSIS — E1122 Type 2 diabetes mellitus with diabetic chronic kidney disease: Secondary | ICD-10-CM | POA: Diagnosis not present

## 2017-07-07 DIAGNOSIS — N186 End stage renal disease: Secondary | ICD-10-CM | POA: Diagnosis not present

## 2017-07-07 DIAGNOSIS — D689 Coagulation defect, unspecified: Secondary | ICD-10-CM | POA: Diagnosis not present

## 2017-07-09 DIAGNOSIS — D631 Anemia in chronic kidney disease: Secondary | ICD-10-CM | POA: Diagnosis not present

## 2017-07-09 DIAGNOSIS — N186 End stage renal disease: Secondary | ICD-10-CM | POA: Diagnosis not present

## 2017-07-09 DIAGNOSIS — N2581 Secondary hyperparathyroidism of renal origin: Secondary | ICD-10-CM | POA: Diagnosis not present

## 2017-07-09 DIAGNOSIS — E1122 Type 2 diabetes mellitus with diabetic chronic kidney disease: Secondary | ICD-10-CM | POA: Diagnosis not present

## 2017-07-09 DIAGNOSIS — Z992 Dependence on renal dialysis: Secondary | ICD-10-CM | POA: Diagnosis not present

## 2017-07-09 DIAGNOSIS — D689 Coagulation defect, unspecified: Secondary | ICD-10-CM | POA: Diagnosis not present

## 2017-07-10 ENCOUNTER — Ambulatory Visit
Admission: RE | Admit: 2017-07-10 | Discharge: 2017-07-10 | Disposition: A | Discharge status: Home / Self Care | Payer: Medicare Other | Source: Ambulatory Visit | Attending: Internal Medicine | Admitting: Internal Medicine

## 2017-07-10 DIAGNOSIS — K8020 Calculus of gallbladder without cholecystitis without obstruction: Secondary | ICD-10-CM | POA: Diagnosis not present

## 2017-07-10 DIAGNOSIS — R748 Abnormal levels of other serum enzymes: Secondary | ICD-10-CM

## 2017-07-11 DIAGNOSIS — D631 Anemia in chronic kidney disease: Secondary | ICD-10-CM | POA: Diagnosis not present

## 2017-07-11 DIAGNOSIS — Z992 Dependence on renal dialysis: Secondary | ICD-10-CM | POA: Diagnosis not present

## 2017-07-11 DIAGNOSIS — D689 Coagulation defect, unspecified: Secondary | ICD-10-CM | POA: Diagnosis not present

## 2017-07-11 DIAGNOSIS — N186 End stage renal disease: Secondary | ICD-10-CM | POA: Diagnosis not present

## 2017-07-11 DIAGNOSIS — E1122 Type 2 diabetes mellitus with diabetic chronic kidney disease: Secondary | ICD-10-CM | POA: Diagnosis not present

## 2017-07-11 DIAGNOSIS — N2581 Secondary hyperparathyroidism of renal origin: Secondary | ICD-10-CM | POA: Diagnosis not present

## 2017-07-14 DIAGNOSIS — E1122 Type 2 diabetes mellitus with diabetic chronic kidney disease: Secondary | ICD-10-CM | POA: Diagnosis not present

## 2017-07-14 DIAGNOSIS — N186 End stage renal disease: Secondary | ICD-10-CM | POA: Diagnosis not present

## 2017-07-14 DIAGNOSIS — Z992 Dependence on renal dialysis: Secondary | ICD-10-CM | POA: Diagnosis not present

## 2017-07-14 DIAGNOSIS — D689 Coagulation defect, unspecified: Secondary | ICD-10-CM | POA: Diagnosis not present

## 2017-07-14 DIAGNOSIS — N2581 Secondary hyperparathyroidism of renal origin: Secondary | ICD-10-CM | POA: Diagnosis not present

## 2017-07-14 DIAGNOSIS — D631 Anemia in chronic kidney disease: Secondary | ICD-10-CM | POA: Diagnosis not present

## 2017-07-15 DIAGNOSIS — N186 End stage renal disease: Secondary | ICD-10-CM | POA: Diagnosis not present

## 2017-07-15 DIAGNOSIS — Z992 Dependence on renal dialysis: Secondary | ICD-10-CM | POA: Diagnosis not present

## 2017-07-15 DIAGNOSIS — N2581 Secondary hyperparathyroidism of renal origin: Secondary | ICD-10-CM | POA: Diagnosis not present

## 2017-07-15 DIAGNOSIS — D689 Coagulation defect, unspecified: Secondary | ICD-10-CM | POA: Diagnosis not present

## 2017-07-15 DIAGNOSIS — D631 Anemia in chronic kidney disease: Secondary | ICD-10-CM | POA: Diagnosis not present

## 2017-07-15 DIAGNOSIS — E1122 Type 2 diabetes mellitus with diabetic chronic kidney disease: Secondary | ICD-10-CM | POA: Diagnosis not present

## 2017-07-16 DIAGNOSIS — Z992 Dependence on renal dialysis: Secondary | ICD-10-CM | POA: Diagnosis not present

## 2017-07-16 DIAGNOSIS — N186 End stage renal disease: Secondary | ICD-10-CM | POA: Diagnosis not present

## 2017-07-16 DIAGNOSIS — D689 Coagulation defect, unspecified: Secondary | ICD-10-CM | POA: Diagnosis not present

## 2017-07-16 DIAGNOSIS — N2581 Secondary hyperparathyroidism of renal origin: Secondary | ICD-10-CM | POA: Diagnosis not present

## 2017-07-16 DIAGNOSIS — E1122 Type 2 diabetes mellitus with diabetic chronic kidney disease: Secondary | ICD-10-CM | POA: Diagnosis not present

## 2017-07-16 DIAGNOSIS — D631 Anemia in chronic kidney disease: Secondary | ICD-10-CM | POA: Diagnosis not present

## 2017-07-16 DIAGNOSIS — G4733 Obstructive sleep apnea (adult) (pediatric): Secondary | ICD-10-CM | POA: Diagnosis not present

## 2017-07-18 DIAGNOSIS — D689 Coagulation defect, unspecified: Secondary | ICD-10-CM | POA: Diagnosis not present

## 2017-07-18 DIAGNOSIS — N2581 Secondary hyperparathyroidism of renal origin: Secondary | ICD-10-CM | POA: Diagnosis not present

## 2017-07-18 DIAGNOSIS — Z992 Dependence on renal dialysis: Secondary | ICD-10-CM | POA: Diagnosis not present

## 2017-07-18 DIAGNOSIS — N186 End stage renal disease: Secondary | ICD-10-CM | POA: Diagnosis not present

## 2017-07-18 DIAGNOSIS — E1122 Type 2 diabetes mellitus with diabetic chronic kidney disease: Secondary | ICD-10-CM | POA: Diagnosis not present

## 2017-07-18 DIAGNOSIS — D631 Anemia in chronic kidney disease: Secondary | ICD-10-CM | POA: Diagnosis not present

## 2017-07-21 DIAGNOSIS — N2581 Secondary hyperparathyroidism of renal origin: Secondary | ICD-10-CM | POA: Diagnosis not present

## 2017-07-21 DIAGNOSIS — Z992 Dependence on renal dialysis: Secondary | ICD-10-CM | POA: Diagnosis not present

## 2017-07-21 DIAGNOSIS — D689 Coagulation defect, unspecified: Secondary | ICD-10-CM | POA: Diagnosis not present

## 2017-07-21 DIAGNOSIS — N186 End stage renal disease: Secondary | ICD-10-CM | POA: Diagnosis not present

## 2017-07-21 DIAGNOSIS — E1122 Type 2 diabetes mellitus with diabetic chronic kidney disease: Secondary | ICD-10-CM | POA: Diagnosis not present

## 2017-07-21 DIAGNOSIS — D631 Anemia in chronic kidney disease: Secondary | ICD-10-CM | POA: Diagnosis not present

## 2017-07-23 DIAGNOSIS — E1122 Type 2 diabetes mellitus with diabetic chronic kidney disease: Secondary | ICD-10-CM | POA: Diagnosis not present

## 2017-07-23 DIAGNOSIS — D631 Anemia in chronic kidney disease: Secondary | ICD-10-CM | POA: Diagnosis not present

## 2017-07-23 DIAGNOSIS — Z992 Dependence on renal dialysis: Secondary | ICD-10-CM | POA: Diagnosis not present

## 2017-07-23 DIAGNOSIS — N2581 Secondary hyperparathyroidism of renal origin: Secondary | ICD-10-CM | POA: Diagnosis not present

## 2017-07-23 DIAGNOSIS — D689 Coagulation defect, unspecified: Secondary | ICD-10-CM | POA: Diagnosis not present

## 2017-07-23 DIAGNOSIS — N186 End stage renal disease: Secondary | ICD-10-CM | POA: Diagnosis not present

## 2017-07-25 DIAGNOSIS — D689 Coagulation defect, unspecified: Secondary | ICD-10-CM | POA: Diagnosis not present

## 2017-07-25 DIAGNOSIS — Z992 Dependence on renal dialysis: Secondary | ICD-10-CM | POA: Diagnosis not present

## 2017-07-25 DIAGNOSIS — E1122 Type 2 diabetes mellitus with diabetic chronic kidney disease: Secondary | ICD-10-CM | POA: Diagnosis not present

## 2017-07-25 DIAGNOSIS — D631 Anemia in chronic kidney disease: Secondary | ICD-10-CM | POA: Diagnosis not present

## 2017-07-25 DIAGNOSIS — N2581 Secondary hyperparathyroidism of renal origin: Secondary | ICD-10-CM | POA: Diagnosis not present

## 2017-07-25 DIAGNOSIS — N186 End stage renal disease: Secondary | ICD-10-CM | POA: Diagnosis not present

## 2017-07-28 DIAGNOSIS — E1122 Type 2 diabetes mellitus with diabetic chronic kidney disease: Secondary | ICD-10-CM | POA: Diagnosis not present

## 2017-07-28 DIAGNOSIS — Z992 Dependence on renal dialysis: Secondary | ICD-10-CM | POA: Diagnosis not present

## 2017-07-28 DIAGNOSIS — N186 End stage renal disease: Secondary | ICD-10-CM | POA: Diagnosis not present

## 2017-07-28 DIAGNOSIS — D689 Coagulation defect, unspecified: Secondary | ICD-10-CM | POA: Diagnosis not present

## 2017-07-28 DIAGNOSIS — D631 Anemia in chronic kidney disease: Secondary | ICD-10-CM | POA: Diagnosis not present

## 2017-07-28 DIAGNOSIS — N2581 Secondary hyperparathyroidism of renal origin: Secondary | ICD-10-CM | POA: Diagnosis not present

## 2017-07-30 DIAGNOSIS — D689 Coagulation defect, unspecified: Secondary | ICD-10-CM | POA: Diagnosis not present

## 2017-07-30 DIAGNOSIS — N2581 Secondary hyperparathyroidism of renal origin: Secondary | ICD-10-CM | POA: Diagnosis not present

## 2017-07-30 DIAGNOSIS — Z992 Dependence on renal dialysis: Secondary | ICD-10-CM | POA: Diagnosis not present

## 2017-07-30 DIAGNOSIS — E1122 Type 2 diabetes mellitus with diabetic chronic kidney disease: Secondary | ICD-10-CM | POA: Diagnosis not present

## 2017-07-30 DIAGNOSIS — D631 Anemia in chronic kidney disease: Secondary | ICD-10-CM | POA: Diagnosis not present

## 2017-07-30 DIAGNOSIS — N186 End stage renal disease: Secondary | ICD-10-CM | POA: Diagnosis not present

## 2017-07-31 DIAGNOSIS — N186 End stage renal disease: Secondary | ICD-10-CM | POA: Diagnosis not present

## 2017-07-31 DIAGNOSIS — I129 Hypertensive chronic kidney disease with stage 1 through stage 4 chronic kidney disease, or unspecified chronic kidney disease: Secondary | ICD-10-CM | POA: Diagnosis not present

## 2017-07-31 DIAGNOSIS — Z992 Dependence on renal dialysis: Secondary | ICD-10-CM | POA: Diagnosis not present

## 2017-08-01 DIAGNOSIS — Z23 Encounter for immunization: Secondary | ICD-10-CM | POA: Diagnosis not present

## 2017-08-01 DIAGNOSIS — N186 End stage renal disease: Secondary | ICD-10-CM | POA: Diagnosis not present

## 2017-08-01 DIAGNOSIS — Z992 Dependence on renal dialysis: Secondary | ICD-10-CM | POA: Diagnosis not present

## 2017-08-01 DIAGNOSIS — D509 Iron deficiency anemia, unspecified: Secondary | ICD-10-CM | POA: Diagnosis not present

## 2017-08-01 DIAGNOSIS — R52 Pain, unspecified: Secondary | ICD-10-CM | POA: Diagnosis not present

## 2017-08-01 DIAGNOSIS — N2581 Secondary hyperparathyroidism of renal origin: Secondary | ICD-10-CM | POA: Diagnosis not present

## 2017-08-01 DIAGNOSIS — D631 Anemia in chronic kidney disease: Secondary | ICD-10-CM | POA: Diagnosis not present

## 2017-08-01 DIAGNOSIS — E1122 Type 2 diabetes mellitus with diabetic chronic kidney disease: Secondary | ICD-10-CM | POA: Diagnosis not present

## 2017-08-01 DIAGNOSIS — D689 Coagulation defect, unspecified: Secondary | ICD-10-CM | POA: Diagnosis not present

## 2017-08-04 DIAGNOSIS — R52 Pain, unspecified: Secondary | ICD-10-CM | POA: Diagnosis not present

## 2017-08-04 DIAGNOSIS — Z23 Encounter for immunization: Secondary | ICD-10-CM | POA: Diagnosis not present

## 2017-08-04 DIAGNOSIS — D689 Coagulation defect, unspecified: Secondary | ICD-10-CM | POA: Diagnosis not present

## 2017-08-04 DIAGNOSIS — N186 End stage renal disease: Secondary | ICD-10-CM | POA: Diagnosis not present

## 2017-08-04 DIAGNOSIS — D631 Anemia in chronic kidney disease: Secondary | ICD-10-CM | POA: Diagnosis not present

## 2017-08-04 DIAGNOSIS — N2581 Secondary hyperparathyroidism of renal origin: Secondary | ICD-10-CM | POA: Diagnosis not present

## 2017-08-04 DIAGNOSIS — Z992 Dependence on renal dialysis: Secondary | ICD-10-CM | POA: Diagnosis not present

## 2017-08-04 DIAGNOSIS — D509 Iron deficiency anemia, unspecified: Secondary | ICD-10-CM | POA: Diagnosis not present

## 2017-08-04 DIAGNOSIS — E1122 Type 2 diabetes mellitus with diabetic chronic kidney disease: Secondary | ICD-10-CM | POA: Diagnosis not present

## 2017-08-06 DIAGNOSIS — D509 Iron deficiency anemia, unspecified: Secondary | ICD-10-CM | POA: Diagnosis not present

## 2017-08-06 DIAGNOSIS — Z23 Encounter for immunization: Secondary | ICD-10-CM | POA: Diagnosis not present

## 2017-08-06 DIAGNOSIS — E1122 Type 2 diabetes mellitus with diabetic chronic kidney disease: Secondary | ICD-10-CM | POA: Diagnosis not present

## 2017-08-06 DIAGNOSIS — D689 Coagulation defect, unspecified: Secondary | ICD-10-CM | POA: Diagnosis not present

## 2017-08-06 DIAGNOSIS — R52 Pain, unspecified: Secondary | ICD-10-CM | POA: Diagnosis not present

## 2017-08-06 DIAGNOSIS — Z992 Dependence on renal dialysis: Secondary | ICD-10-CM | POA: Diagnosis not present

## 2017-08-06 DIAGNOSIS — D631 Anemia in chronic kidney disease: Secondary | ICD-10-CM | POA: Diagnosis not present

## 2017-08-06 DIAGNOSIS — N186 End stage renal disease: Secondary | ICD-10-CM | POA: Diagnosis not present

## 2017-08-06 DIAGNOSIS — N2581 Secondary hyperparathyroidism of renal origin: Secondary | ICD-10-CM | POA: Diagnosis not present

## 2017-08-07 DIAGNOSIS — Z992 Dependence on renal dialysis: Secondary | ICD-10-CM | POA: Diagnosis not present

## 2017-08-07 DIAGNOSIS — R52 Pain, unspecified: Secondary | ICD-10-CM | POA: Diagnosis not present

## 2017-08-07 DIAGNOSIS — N2581 Secondary hyperparathyroidism of renal origin: Secondary | ICD-10-CM | POA: Diagnosis not present

## 2017-08-07 DIAGNOSIS — N186 End stage renal disease: Secondary | ICD-10-CM | POA: Diagnosis not present

## 2017-08-07 DIAGNOSIS — D689 Coagulation defect, unspecified: Secondary | ICD-10-CM | POA: Diagnosis not present

## 2017-08-07 DIAGNOSIS — D631 Anemia in chronic kidney disease: Secondary | ICD-10-CM | POA: Diagnosis not present

## 2017-08-07 DIAGNOSIS — D509 Iron deficiency anemia, unspecified: Secondary | ICD-10-CM | POA: Diagnosis not present

## 2017-08-07 DIAGNOSIS — Z23 Encounter for immunization: Secondary | ICD-10-CM | POA: Diagnosis not present

## 2017-08-07 DIAGNOSIS — E1122 Type 2 diabetes mellitus with diabetic chronic kidney disease: Secondary | ICD-10-CM | POA: Diagnosis not present

## 2017-08-08 DIAGNOSIS — N186 End stage renal disease: Secondary | ICD-10-CM | POA: Diagnosis not present

## 2017-08-08 DIAGNOSIS — N2581 Secondary hyperparathyroidism of renal origin: Secondary | ICD-10-CM | POA: Diagnosis not present

## 2017-08-08 DIAGNOSIS — D509 Iron deficiency anemia, unspecified: Secondary | ICD-10-CM | POA: Diagnosis not present

## 2017-08-08 DIAGNOSIS — D689 Coagulation defect, unspecified: Secondary | ICD-10-CM | POA: Diagnosis not present

## 2017-08-08 DIAGNOSIS — D631 Anemia in chronic kidney disease: Secondary | ICD-10-CM | POA: Diagnosis not present

## 2017-08-08 DIAGNOSIS — Z992 Dependence on renal dialysis: Secondary | ICD-10-CM | POA: Diagnosis not present

## 2017-08-08 DIAGNOSIS — R52 Pain, unspecified: Secondary | ICD-10-CM | POA: Diagnosis not present

## 2017-08-08 DIAGNOSIS — Z23 Encounter for immunization: Secondary | ICD-10-CM | POA: Diagnosis not present

## 2017-08-08 DIAGNOSIS — E1122 Type 2 diabetes mellitus with diabetic chronic kidney disease: Secondary | ICD-10-CM | POA: Diagnosis not present

## 2017-08-11 DIAGNOSIS — D509 Iron deficiency anemia, unspecified: Secondary | ICD-10-CM | POA: Diagnosis not present

## 2017-08-11 DIAGNOSIS — D631 Anemia in chronic kidney disease: Secondary | ICD-10-CM | POA: Diagnosis not present

## 2017-08-11 DIAGNOSIS — Z23 Encounter for immunization: Secondary | ICD-10-CM | POA: Diagnosis not present

## 2017-08-11 DIAGNOSIS — R52 Pain, unspecified: Secondary | ICD-10-CM | POA: Diagnosis not present

## 2017-08-11 DIAGNOSIS — N2581 Secondary hyperparathyroidism of renal origin: Secondary | ICD-10-CM | POA: Diagnosis not present

## 2017-08-11 DIAGNOSIS — D689 Coagulation defect, unspecified: Secondary | ICD-10-CM | POA: Diagnosis not present

## 2017-08-11 DIAGNOSIS — Z992 Dependence on renal dialysis: Secondary | ICD-10-CM | POA: Diagnosis not present

## 2017-08-11 DIAGNOSIS — E1122 Type 2 diabetes mellitus with diabetic chronic kidney disease: Secondary | ICD-10-CM | POA: Diagnosis not present

## 2017-08-11 DIAGNOSIS — N186 End stage renal disease: Secondary | ICD-10-CM | POA: Diagnosis not present

## 2017-08-13 DIAGNOSIS — D631 Anemia in chronic kidney disease: Secondary | ICD-10-CM | POA: Diagnosis not present

## 2017-08-13 DIAGNOSIS — N2581 Secondary hyperparathyroidism of renal origin: Secondary | ICD-10-CM | POA: Diagnosis not present

## 2017-08-13 DIAGNOSIS — R52 Pain, unspecified: Secondary | ICD-10-CM | POA: Diagnosis not present

## 2017-08-13 DIAGNOSIS — E1122 Type 2 diabetes mellitus with diabetic chronic kidney disease: Secondary | ICD-10-CM | POA: Diagnosis not present

## 2017-08-13 DIAGNOSIS — Z23 Encounter for immunization: Secondary | ICD-10-CM | POA: Diagnosis not present

## 2017-08-13 DIAGNOSIS — D509 Iron deficiency anemia, unspecified: Secondary | ICD-10-CM | POA: Diagnosis not present

## 2017-08-13 DIAGNOSIS — D689 Coagulation defect, unspecified: Secondary | ICD-10-CM | POA: Diagnosis not present

## 2017-08-13 DIAGNOSIS — G4733 Obstructive sleep apnea (adult) (pediatric): Secondary | ICD-10-CM | POA: Diagnosis not present

## 2017-08-13 DIAGNOSIS — Z992 Dependence on renal dialysis: Secondary | ICD-10-CM | POA: Diagnosis not present

## 2017-08-13 DIAGNOSIS — N186 End stage renal disease: Secondary | ICD-10-CM | POA: Diagnosis not present

## 2017-08-15 DIAGNOSIS — D509 Iron deficiency anemia, unspecified: Secondary | ICD-10-CM | POA: Diagnosis not present

## 2017-08-15 DIAGNOSIS — D631 Anemia in chronic kidney disease: Secondary | ICD-10-CM | POA: Diagnosis not present

## 2017-08-15 DIAGNOSIS — N186 End stage renal disease: Secondary | ICD-10-CM | POA: Diagnosis not present

## 2017-08-15 DIAGNOSIS — D689 Coagulation defect, unspecified: Secondary | ICD-10-CM | POA: Diagnosis not present

## 2017-08-15 DIAGNOSIS — Z992 Dependence on renal dialysis: Secondary | ICD-10-CM | POA: Diagnosis not present

## 2017-08-15 DIAGNOSIS — E1122 Type 2 diabetes mellitus with diabetic chronic kidney disease: Secondary | ICD-10-CM | POA: Diagnosis not present

## 2017-08-15 DIAGNOSIS — Z23 Encounter for immunization: Secondary | ICD-10-CM | POA: Diagnosis not present

## 2017-08-15 DIAGNOSIS — R52 Pain, unspecified: Secondary | ICD-10-CM | POA: Diagnosis not present

## 2017-08-15 DIAGNOSIS — N2581 Secondary hyperparathyroidism of renal origin: Secondary | ICD-10-CM | POA: Diagnosis not present

## 2017-08-18 DIAGNOSIS — D631 Anemia in chronic kidney disease: Secondary | ICD-10-CM | POA: Diagnosis not present

## 2017-08-18 DIAGNOSIS — R52 Pain, unspecified: Secondary | ICD-10-CM | POA: Diagnosis not present

## 2017-08-18 DIAGNOSIS — D509 Iron deficiency anemia, unspecified: Secondary | ICD-10-CM | POA: Diagnosis not present

## 2017-08-18 DIAGNOSIS — N186 End stage renal disease: Secondary | ICD-10-CM | POA: Diagnosis not present

## 2017-08-18 DIAGNOSIS — D689 Coagulation defect, unspecified: Secondary | ICD-10-CM | POA: Diagnosis not present

## 2017-08-18 DIAGNOSIS — E1122 Type 2 diabetes mellitus with diabetic chronic kidney disease: Secondary | ICD-10-CM | POA: Diagnosis not present

## 2017-08-18 DIAGNOSIS — Z992 Dependence on renal dialysis: Secondary | ICD-10-CM | POA: Diagnosis not present

## 2017-08-18 DIAGNOSIS — N2581 Secondary hyperparathyroidism of renal origin: Secondary | ICD-10-CM | POA: Diagnosis not present

## 2017-08-18 DIAGNOSIS — Z23 Encounter for immunization: Secondary | ICD-10-CM | POA: Diagnosis not present

## 2017-08-20 ENCOUNTER — Ambulatory Visit: Payer: Medicare Other | Admitting: Cardiovascular Disease

## 2017-08-20 DIAGNOSIS — N2581 Secondary hyperparathyroidism of renal origin: Secondary | ICD-10-CM | POA: Diagnosis not present

## 2017-08-20 DIAGNOSIS — E1122 Type 2 diabetes mellitus with diabetic chronic kidney disease: Secondary | ICD-10-CM | POA: Diagnosis not present

## 2017-08-20 DIAGNOSIS — Z992 Dependence on renal dialysis: Secondary | ICD-10-CM | POA: Diagnosis not present

## 2017-08-20 DIAGNOSIS — R52 Pain, unspecified: Secondary | ICD-10-CM | POA: Diagnosis not present

## 2017-08-20 DIAGNOSIS — Z23 Encounter for immunization: Secondary | ICD-10-CM | POA: Diagnosis not present

## 2017-08-20 DIAGNOSIS — N186 End stage renal disease: Secondary | ICD-10-CM | POA: Diagnosis not present

## 2017-08-20 DIAGNOSIS — D689 Coagulation defect, unspecified: Secondary | ICD-10-CM | POA: Diagnosis not present

## 2017-08-20 DIAGNOSIS — D509 Iron deficiency anemia, unspecified: Secondary | ICD-10-CM | POA: Diagnosis not present

## 2017-08-20 DIAGNOSIS — D631 Anemia in chronic kidney disease: Secondary | ICD-10-CM | POA: Diagnosis not present

## 2017-08-22 DIAGNOSIS — N2581 Secondary hyperparathyroidism of renal origin: Secondary | ICD-10-CM | POA: Diagnosis not present

## 2017-08-22 DIAGNOSIS — Z992 Dependence on renal dialysis: Secondary | ICD-10-CM | POA: Diagnosis not present

## 2017-08-22 DIAGNOSIS — N186 End stage renal disease: Secondary | ICD-10-CM | POA: Diagnosis not present

## 2017-08-22 DIAGNOSIS — D631 Anemia in chronic kidney disease: Secondary | ICD-10-CM | POA: Diagnosis not present

## 2017-08-22 DIAGNOSIS — D509 Iron deficiency anemia, unspecified: Secondary | ICD-10-CM | POA: Diagnosis not present

## 2017-08-22 DIAGNOSIS — E1122 Type 2 diabetes mellitus with diabetic chronic kidney disease: Secondary | ICD-10-CM | POA: Diagnosis not present

## 2017-08-22 DIAGNOSIS — R52 Pain, unspecified: Secondary | ICD-10-CM | POA: Diagnosis not present

## 2017-08-22 DIAGNOSIS — Z23 Encounter for immunization: Secondary | ICD-10-CM | POA: Diagnosis not present

## 2017-08-22 DIAGNOSIS — D689 Coagulation defect, unspecified: Secondary | ICD-10-CM | POA: Diagnosis not present

## 2017-08-25 DIAGNOSIS — E1122 Type 2 diabetes mellitus with diabetic chronic kidney disease: Secondary | ICD-10-CM | POA: Diagnosis not present

## 2017-08-25 DIAGNOSIS — N2581 Secondary hyperparathyroidism of renal origin: Secondary | ICD-10-CM | POA: Diagnosis not present

## 2017-08-25 DIAGNOSIS — Z23 Encounter for immunization: Secondary | ICD-10-CM | POA: Diagnosis not present

## 2017-08-25 DIAGNOSIS — D509 Iron deficiency anemia, unspecified: Secondary | ICD-10-CM | POA: Diagnosis not present

## 2017-08-25 DIAGNOSIS — N186 End stage renal disease: Secondary | ICD-10-CM | POA: Diagnosis not present

## 2017-08-25 DIAGNOSIS — D631 Anemia in chronic kidney disease: Secondary | ICD-10-CM | POA: Diagnosis not present

## 2017-08-25 DIAGNOSIS — Z992 Dependence on renal dialysis: Secondary | ICD-10-CM | POA: Diagnosis not present

## 2017-08-25 DIAGNOSIS — D689 Coagulation defect, unspecified: Secondary | ICD-10-CM | POA: Diagnosis not present

## 2017-08-25 DIAGNOSIS — R52 Pain, unspecified: Secondary | ICD-10-CM | POA: Diagnosis not present

## 2017-08-27 DIAGNOSIS — R52 Pain, unspecified: Secondary | ICD-10-CM | POA: Diagnosis not present

## 2017-08-27 DIAGNOSIS — N2581 Secondary hyperparathyroidism of renal origin: Secondary | ICD-10-CM | POA: Diagnosis not present

## 2017-08-27 DIAGNOSIS — Z23 Encounter for immunization: Secondary | ICD-10-CM | POA: Diagnosis not present

## 2017-08-27 DIAGNOSIS — N186 End stage renal disease: Secondary | ICD-10-CM | POA: Diagnosis not present

## 2017-08-27 DIAGNOSIS — D689 Coagulation defect, unspecified: Secondary | ICD-10-CM | POA: Diagnosis not present

## 2017-08-27 DIAGNOSIS — D509 Iron deficiency anemia, unspecified: Secondary | ICD-10-CM | POA: Diagnosis not present

## 2017-08-27 DIAGNOSIS — E1122 Type 2 diabetes mellitus with diabetic chronic kidney disease: Secondary | ICD-10-CM | POA: Diagnosis not present

## 2017-08-27 DIAGNOSIS — Z992 Dependence on renal dialysis: Secondary | ICD-10-CM | POA: Diagnosis not present

## 2017-08-27 DIAGNOSIS — D631 Anemia in chronic kidney disease: Secondary | ICD-10-CM | POA: Diagnosis not present

## 2017-08-29 DIAGNOSIS — E1122 Type 2 diabetes mellitus with diabetic chronic kidney disease: Secondary | ICD-10-CM | POA: Diagnosis not present

## 2017-08-29 DIAGNOSIS — N2581 Secondary hyperparathyroidism of renal origin: Secondary | ICD-10-CM | POA: Diagnosis not present

## 2017-08-29 DIAGNOSIS — N186 End stage renal disease: Secondary | ICD-10-CM | POA: Diagnosis not present

## 2017-08-29 DIAGNOSIS — D509 Iron deficiency anemia, unspecified: Secondary | ICD-10-CM | POA: Diagnosis not present

## 2017-08-29 DIAGNOSIS — R52 Pain, unspecified: Secondary | ICD-10-CM | POA: Diagnosis not present

## 2017-08-29 DIAGNOSIS — D689 Coagulation defect, unspecified: Secondary | ICD-10-CM | POA: Diagnosis not present

## 2017-08-29 DIAGNOSIS — D631 Anemia in chronic kidney disease: Secondary | ICD-10-CM | POA: Diagnosis not present

## 2017-08-29 DIAGNOSIS — Z23 Encounter for immunization: Secondary | ICD-10-CM | POA: Diagnosis not present

## 2017-08-29 DIAGNOSIS — Z992 Dependence on renal dialysis: Secondary | ICD-10-CM | POA: Diagnosis not present

## 2017-08-31 DIAGNOSIS — I129 Hypertensive chronic kidney disease with stage 1 through stage 4 chronic kidney disease, or unspecified chronic kidney disease: Secondary | ICD-10-CM | POA: Diagnosis not present

## 2017-08-31 DIAGNOSIS — Z992 Dependence on renal dialysis: Secondary | ICD-10-CM | POA: Diagnosis not present

## 2017-08-31 DIAGNOSIS — N186 End stage renal disease: Secondary | ICD-10-CM | POA: Diagnosis not present

## 2017-09-01 DIAGNOSIS — N186 End stage renal disease: Secondary | ICD-10-CM | POA: Diagnosis not present

## 2017-09-01 DIAGNOSIS — Z992 Dependence on renal dialysis: Secondary | ICD-10-CM | POA: Diagnosis not present

## 2017-09-01 DIAGNOSIS — E1122 Type 2 diabetes mellitus with diabetic chronic kidney disease: Secondary | ICD-10-CM | POA: Diagnosis not present

## 2017-09-01 DIAGNOSIS — D509 Iron deficiency anemia, unspecified: Secondary | ICD-10-CM | POA: Diagnosis not present

## 2017-09-01 DIAGNOSIS — N2581 Secondary hyperparathyroidism of renal origin: Secondary | ICD-10-CM | POA: Diagnosis not present

## 2017-09-01 DIAGNOSIS — D631 Anemia in chronic kidney disease: Secondary | ICD-10-CM | POA: Diagnosis not present

## 2017-09-01 DIAGNOSIS — D689 Coagulation defect, unspecified: Secondary | ICD-10-CM | POA: Diagnosis not present

## 2017-09-03 DIAGNOSIS — E1122 Type 2 diabetes mellitus with diabetic chronic kidney disease: Secondary | ICD-10-CM | POA: Diagnosis not present

## 2017-09-03 DIAGNOSIS — D689 Coagulation defect, unspecified: Secondary | ICD-10-CM | POA: Diagnosis not present

## 2017-09-03 DIAGNOSIS — D509 Iron deficiency anemia, unspecified: Secondary | ICD-10-CM | POA: Diagnosis not present

## 2017-09-03 DIAGNOSIS — N186 End stage renal disease: Secondary | ICD-10-CM | POA: Diagnosis not present

## 2017-09-03 DIAGNOSIS — Z992 Dependence on renal dialysis: Secondary | ICD-10-CM | POA: Diagnosis not present

## 2017-09-03 DIAGNOSIS — D631 Anemia in chronic kidney disease: Secondary | ICD-10-CM | POA: Diagnosis not present

## 2017-09-03 DIAGNOSIS — N2581 Secondary hyperparathyroidism of renal origin: Secondary | ICD-10-CM | POA: Diagnosis not present

## 2017-09-05 DIAGNOSIS — Z992 Dependence on renal dialysis: Secondary | ICD-10-CM | POA: Diagnosis not present

## 2017-09-05 DIAGNOSIS — D509 Iron deficiency anemia, unspecified: Secondary | ICD-10-CM | POA: Diagnosis not present

## 2017-09-05 DIAGNOSIS — N2581 Secondary hyperparathyroidism of renal origin: Secondary | ICD-10-CM | POA: Diagnosis not present

## 2017-09-05 DIAGNOSIS — N186 End stage renal disease: Secondary | ICD-10-CM | POA: Diagnosis not present

## 2017-09-05 DIAGNOSIS — E1122 Type 2 diabetes mellitus with diabetic chronic kidney disease: Secondary | ICD-10-CM | POA: Diagnosis not present

## 2017-09-05 DIAGNOSIS — D631 Anemia in chronic kidney disease: Secondary | ICD-10-CM | POA: Diagnosis not present

## 2017-09-05 DIAGNOSIS — D689 Coagulation defect, unspecified: Secondary | ICD-10-CM | POA: Diagnosis not present

## 2017-09-08 DIAGNOSIS — D509 Iron deficiency anemia, unspecified: Secondary | ICD-10-CM | POA: Diagnosis not present

## 2017-09-08 DIAGNOSIS — D689 Coagulation defect, unspecified: Secondary | ICD-10-CM | POA: Diagnosis not present

## 2017-09-08 DIAGNOSIS — N2581 Secondary hyperparathyroidism of renal origin: Secondary | ICD-10-CM | POA: Diagnosis not present

## 2017-09-08 DIAGNOSIS — Z992 Dependence on renal dialysis: Secondary | ICD-10-CM | POA: Diagnosis not present

## 2017-09-08 DIAGNOSIS — N186 End stage renal disease: Secondary | ICD-10-CM | POA: Diagnosis not present

## 2017-09-08 DIAGNOSIS — E1122 Type 2 diabetes mellitus with diabetic chronic kidney disease: Secondary | ICD-10-CM | POA: Diagnosis not present

## 2017-09-08 DIAGNOSIS — D631 Anemia in chronic kidney disease: Secondary | ICD-10-CM | POA: Diagnosis not present

## 2017-09-09 DIAGNOSIS — I871 Compression of vein: Secondary | ICD-10-CM | POA: Diagnosis not present

## 2017-09-09 DIAGNOSIS — N186 End stage renal disease: Secondary | ICD-10-CM | POA: Diagnosis not present

## 2017-09-09 DIAGNOSIS — T82858A Stenosis of vascular prosthetic devices, implants and grafts, initial encounter: Secondary | ICD-10-CM | POA: Diagnosis not present

## 2017-09-09 DIAGNOSIS — Z992 Dependence on renal dialysis: Secondary | ICD-10-CM | POA: Diagnosis not present

## 2017-09-10 DIAGNOSIS — N2581 Secondary hyperparathyroidism of renal origin: Secondary | ICD-10-CM | POA: Diagnosis not present

## 2017-09-10 DIAGNOSIS — Z992 Dependence on renal dialysis: Secondary | ICD-10-CM | POA: Diagnosis not present

## 2017-09-10 DIAGNOSIS — N186 End stage renal disease: Secondary | ICD-10-CM | POA: Diagnosis not present

## 2017-09-10 DIAGNOSIS — D689 Coagulation defect, unspecified: Secondary | ICD-10-CM | POA: Diagnosis not present

## 2017-09-10 DIAGNOSIS — D631 Anemia in chronic kidney disease: Secondary | ICD-10-CM | POA: Diagnosis not present

## 2017-09-10 DIAGNOSIS — D509 Iron deficiency anemia, unspecified: Secondary | ICD-10-CM | POA: Diagnosis not present

## 2017-09-10 DIAGNOSIS — E1122 Type 2 diabetes mellitus with diabetic chronic kidney disease: Secondary | ICD-10-CM | POA: Diagnosis not present

## 2017-09-12 DIAGNOSIS — D631 Anemia in chronic kidney disease: Secondary | ICD-10-CM | POA: Diagnosis not present

## 2017-09-12 DIAGNOSIS — E1122 Type 2 diabetes mellitus with diabetic chronic kidney disease: Secondary | ICD-10-CM | POA: Diagnosis not present

## 2017-09-12 DIAGNOSIS — N2581 Secondary hyperparathyroidism of renal origin: Secondary | ICD-10-CM | POA: Diagnosis not present

## 2017-09-12 DIAGNOSIS — D689 Coagulation defect, unspecified: Secondary | ICD-10-CM | POA: Diagnosis not present

## 2017-09-12 DIAGNOSIS — Z992 Dependence on renal dialysis: Secondary | ICD-10-CM | POA: Diagnosis not present

## 2017-09-12 DIAGNOSIS — N186 End stage renal disease: Secondary | ICD-10-CM | POA: Diagnosis not present

## 2017-09-12 DIAGNOSIS — D509 Iron deficiency anemia, unspecified: Secondary | ICD-10-CM | POA: Diagnosis not present

## 2017-09-13 DIAGNOSIS — G4733 Obstructive sleep apnea (adult) (pediatric): Secondary | ICD-10-CM | POA: Diagnosis not present

## 2017-09-15 DIAGNOSIS — D631 Anemia in chronic kidney disease: Secondary | ICD-10-CM | POA: Diagnosis not present

## 2017-09-15 DIAGNOSIS — E1122 Type 2 diabetes mellitus with diabetic chronic kidney disease: Secondary | ICD-10-CM | POA: Diagnosis not present

## 2017-09-15 DIAGNOSIS — Z992 Dependence on renal dialysis: Secondary | ICD-10-CM | POA: Diagnosis not present

## 2017-09-15 DIAGNOSIS — N2581 Secondary hyperparathyroidism of renal origin: Secondary | ICD-10-CM | POA: Diagnosis not present

## 2017-09-15 DIAGNOSIS — N186 End stage renal disease: Secondary | ICD-10-CM | POA: Diagnosis not present

## 2017-09-15 DIAGNOSIS — D689 Coagulation defect, unspecified: Secondary | ICD-10-CM | POA: Diagnosis not present

## 2017-09-15 DIAGNOSIS — D509 Iron deficiency anemia, unspecified: Secondary | ICD-10-CM | POA: Diagnosis not present

## 2017-09-17 DIAGNOSIS — N2581 Secondary hyperparathyroidism of renal origin: Secondary | ICD-10-CM | POA: Diagnosis not present

## 2017-09-17 DIAGNOSIS — E1122 Type 2 diabetes mellitus with diabetic chronic kidney disease: Secondary | ICD-10-CM | POA: Diagnosis not present

## 2017-09-17 DIAGNOSIS — D509 Iron deficiency anemia, unspecified: Secondary | ICD-10-CM | POA: Diagnosis not present

## 2017-09-17 DIAGNOSIS — N186 End stage renal disease: Secondary | ICD-10-CM | POA: Diagnosis not present

## 2017-09-17 DIAGNOSIS — D631 Anemia in chronic kidney disease: Secondary | ICD-10-CM | POA: Diagnosis not present

## 2017-09-17 DIAGNOSIS — Z992 Dependence on renal dialysis: Secondary | ICD-10-CM | POA: Diagnosis not present

## 2017-09-17 DIAGNOSIS — D689 Coagulation defect, unspecified: Secondary | ICD-10-CM | POA: Diagnosis not present

## 2017-09-19 DIAGNOSIS — D631 Anemia in chronic kidney disease: Secondary | ICD-10-CM | POA: Diagnosis not present

## 2017-09-19 DIAGNOSIS — Z992 Dependence on renal dialysis: Secondary | ICD-10-CM | POA: Diagnosis not present

## 2017-09-19 DIAGNOSIS — E1122 Type 2 diabetes mellitus with diabetic chronic kidney disease: Secondary | ICD-10-CM | POA: Diagnosis not present

## 2017-09-19 DIAGNOSIS — D689 Coagulation defect, unspecified: Secondary | ICD-10-CM | POA: Diagnosis not present

## 2017-09-19 DIAGNOSIS — D509 Iron deficiency anemia, unspecified: Secondary | ICD-10-CM | POA: Diagnosis not present

## 2017-09-19 DIAGNOSIS — N186 End stage renal disease: Secondary | ICD-10-CM | POA: Diagnosis not present

## 2017-09-19 DIAGNOSIS — N2581 Secondary hyperparathyroidism of renal origin: Secondary | ICD-10-CM | POA: Diagnosis not present

## 2017-09-22 DIAGNOSIS — Z992 Dependence on renal dialysis: Secondary | ICD-10-CM | POA: Diagnosis not present

## 2017-09-22 DIAGNOSIS — D689 Coagulation defect, unspecified: Secondary | ICD-10-CM | POA: Diagnosis not present

## 2017-09-22 DIAGNOSIS — N2581 Secondary hyperparathyroidism of renal origin: Secondary | ICD-10-CM | POA: Diagnosis not present

## 2017-09-22 DIAGNOSIS — D509 Iron deficiency anemia, unspecified: Secondary | ICD-10-CM | POA: Diagnosis not present

## 2017-09-22 DIAGNOSIS — N186 End stage renal disease: Secondary | ICD-10-CM | POA: Diagnosis not present

## 2017-09-22 DIAGNOSIS — E1122 Type 2 diabetes mellitus with diabetic chronic kidney disease: Secondary | ICD-10-CM | POA: Diagnosis not present

## 2017-09-22 DIAGNOSIS — D631 Anemia in chronic kidney disease: Secondary | ICD-10-CM | POA: Diagnosis not present

## 2017-09-24 DIAGNOSIS — N186 End stage renal disease: Secondary | ICD-10-CM | POA: Diagnosis not present

## 2017-09-24 DIAGNOSIS — N2581 Secondary hyperparathyroidism of renal origin: Secondary | ICD-10-CM | POA: Diagnosis not present

## 2017-09-24 DIAGNOSIS — D689 Coagulation defect, unspecified: Secondary | ICD-10-CM | POA: Diagnosis not present

## 2017-09-24 DIAGNOSIS — D631 Anemia in chronic kidney disease: Secondary | ICD-10-CM | POA: Diagnosis not present

## 2017-09-24 DIAGNOSIS — D509 Iron deficiency anemia, unspecified: Secondary | ICD-10-CM | POA: Diagnosis not present

## 2017-09-24 DIAGNOSIS — E1122 Type 2 diabetes mellitus with diabetic chronic kidney disease: Secondary | ICD-10-CM | POA: Diagnosis not present

## 2017-09-24 DIAGNOSIS — Z992 Dependence on renal dialysis: Secondary | ICD-10-CM | POA: Diagnosis not present

## 2017-09-26 DIAGNOSIS — D509 Iron deficiency anemia, unspecified: Secondary | ICD-10-CM | POA: Diagnosis not present

## 2017-09-26 DIAGNOSIS — Z992 Dependence on renal dialysis: Secondary | ICD-10-CM | POA: Diagnosis not present

## 2017-09-26 DIAGNOSIS — D689 Coagulation defect, unspecified: Secondary | ICD-10-CM | POA: Diagnosis not present

## 2017-09-26 DIAGNOSIS — N186 End stage renal disease: Secondary | ICD-10-CM | POA: Diagnosis not present

## 2017-09-26 DIAGNOSIS — E1122 Type 2 diabetes mellitus with diabetic chronic kidney disease: Secondary | ICD-10-CM | POA: Diagnosis not present

## 2017-09-26 DIAGNOSIS — D631 Anemia in chronic kidney disease: Secondary | ICD-10-CM | POA: Diagnosis not present

## 2017-09-26 DIAGNOSIS — N2581 Secondary hyperparathyroidism of renal origin: Secondary | ICD-10-CM | POA: Diagnosis not present

## 2017-09-29 DIAGNOSIS — Z992 Dependence on renal dialysis: Secondary | ICD-10-CM | POA: Diagnosis not present

## 2017-09-29 DIAGNOSIS — E1122 Type 2 diabetes mellitus with diabetic chronic kidney disease: Secondary | ICD-10-CM | POA: Diagnosis not present

## 2017-09-29 DIAGNOSIS — N186 End stage renal disease: Secondary | ICD-10-CM | POA: Diagnosis not present

## 2017-09-29 DIAGNOSIS — D509 Iron deficiency anemia, unspecified: Secondary | ICD-10-CM | POA: Diagnosis not present

## 2017-09-29 DIAGNOSIS — N2581 Secondary hyperparathyroidism of renal origin: Secondary | ICD-10-CM | POA: Diagnosis not present

## 2017-09-29 DIAGNOSIS — D689 Coagulation defect, unspecified: Secondary | ICD-10-CM | POA: Diagnosis not present

## 2017-09-29 DIAGNOSIS — D631 Anemia in chronic kidney disease: Secondary | ICD-10-CM | POA: Diagnosis not present

## 2017-09-30 DIAGNOSIS — R748 Abnormal levels of other serum enzymes: Secondary | ICD-10-CM | POA: Diagnosis not present

## 2017-09-30 DIAGNOSIS — Z1211 Encounter for screening for malignant neoplasm of colon: Secondary | ICD-10-CM | POA: Diagnosis not present

## 2017-09-30 DIAGNOSIS — K8020 Calculus of gallbladder without cholecystitis without obstruction: Secondary | ICD-10-CM | POA: Diagnosis not present

## 2017-10-01 DIAGNOSIS — Z992 Dependence on renal dialysis: Secondary | ICD-10-CM | POA: Diagnosis not present

## 2017-10-01 DIAGNOSIS — N2581 Secondary hyperparathyroidism of renal origin: Secondary | ICD-10-CM | POA: Diagnosis not present

## 2017-10-01 DIAGNOSIS — E1122 Type 2 diabetes mellitus with diabetic chronic kidney disease: Secondary | ICD-10-CM | POA: Diagnosis not present

## 2017-10-01 DIAGNOSIS — D689 Coagulation defect, unspecified: Secondary | ICD-10-CM | POA: Diagnosis not present

## 2017-10-01 DIAGNOSIS — L299 Pruritus, unspecified: Secondary | ICD-10-CM | POA: Diagnosis not present

## 2017-10-01 DIAGNOSIS — D631 Anemia in chronic kidney disease: Secondary | ICD-10-CM | POA: Diagnosis not present

## 2017-10-01 DIAGNOSIS — N186 End stage renal disease: Secondary | ICD-10-CM | POA: Diagnosis not present

## 2017-10-03 DIAGNOSIS — Z992 Dependence on renal dialysis: Secondary | ICD-10-CM | POA: Diagnosis not present

## 2017-10-03 DIAGNOSIS — N186 End stage renal disease: Secondary | ICD-10-CM | POA: Diagnosis not present

## 2017-10-03 DIAGNOSIS — E1122 Type 2 diabetes mellitus with diabetic chronic kidney disease: Secondary | ICD-10-CM | POA: Diagnosis not present

## 2017-10-03 DIAGNOSIS — N2581 Secondary hyperparathyroidism of renal origin: Secondary | ICD-10-CM | POA: Diagnosis not present

## 2017-10-03 DIAGNOSIS — L299 Pruritus, unspecified: Secondary | ICD-10-CM | POA: Diagnosis not present

## 2017-10-03 DIAGNOSIS — D689 Coagulation defect, unspecified: Secondary | ICD-10-CM | POA: Diagnosis not present

## 2017-10-03 DIAGNOSIS — D631 Anemia in chronic kidney disease: Secondary | ICD-10-CM | POA: Diagnosis not present

## 2017-10-06 DIAGNOSIS — N2581 Secondary hyperparathyroidism of renal origin: Secondary | ICD-10-CM | POA: Diagnosis not present

## 2017-10-06 DIAGNOSIS — D631 Anemia in chronic kidney disease: Secondary | ICD-10-CM | POA: Diagnosis not present

## 2017-10-06 DIAGNOSIS — D689 Coagulation defect, unspecified: Secondary | ICD-10-CM | POA: Diagnosis not present

## 2017-10-06 DIAGNOSIS — Z992 Dependence on renal dialysis: Secondary | ICD-10-CM | POA: Diagnosis not present

## 2017-10-06 DIAGNOSIS — E1122 Type 2 diabetes mellitus with diabetic chronic kidney disease: Secondary | ICD-10-CM | POA: Diagnosis not present

## 2017-10-06 DIAGNOSIS — L299 Pruritus, unspecified: Secondary | ICD-10-CM | POA: Diagnosis not present

## 2017-10-06 DIAGNOSIS — N186 End stage renal disease: Secondary | ICD-10-CM | POA: Diagnosis not present

## 2017-10-08 DIAGNOSIS — Z992 Dependence on renal dialysis: Secondary | ICD-10-CM | POA: Diagnosis not present

## 2017-10-08 DIAGNOSIS — N186 End stage renal disease: Secondary | ICD-10-CM | POA: Diagnosis not present

## 2017-10-08 DIAGNOSIS — N2581 Secondary hyperparathyroidism of renal origin: Secondary | ICD-10-CM | POA: Diagnosis not present

## 2017-10-08 DIAGNOSIS — D689 Coagulation defect, unspecified: Secondary | ICD-10-CM | POA: Diagnosis not present

## 2017-10-08 DIAGNOSIS — E1122 Type 2 diabetes mellitus with diabetic chronic kidney disease: Secondary | ICD-10-CM | POA: Diagnosis not present

## 2017-10-08 DIAGNOSIS — L299 Pruritus, unspecified: Secondary | ICD-10-CM | POA: Diagnosis not present

## 2017-10-08 DIAGNOSIS — D631 Anemia in chronic kidney disease: Secondary | ICD-10-CM | POA: Diagnosis not present

## 2017-10-10 DIAGNOSIS — E1122 Type 2 diabetes mellitus with diabetic chronic kidney disease: Secondary | ICD-10-CM | POA: Diagnosis not present

## 2017-10-10 DIAGNOSIS — N186 End stage renal disease: Secondary | ICD-10-CM | POA: Diagnosis not present

## 2017-10-10 DIAGNOSIS — D689 Coagulation defect, unspecified: Secondary | ICD-10-CM | POA: Diagnosis not present

## 2017-10-10 DIAGNOSIS — Z992 Dependence on renal dialysis: Secondary | ICD-10-CM | POA: Diagnosis not present

## 2017-10-10 DIAGNOSIS — D631 Anemia in chronic kidney disease: Secondary | ICD-10-CM | POA: Diagnosis not present

## 2017-10-10 DIAGNOSIS — L299 Pruritus, unspecified: Secondary | ICD-10-CM | POA: Diagnosis not present

## 2017-10-10 DIAGNOSIS — N2581 Secondary hyperparathyroidism of renal origin: Secondary | ICD-10-CM | POA: Diagnosis not present

## 2017-10-13 DIAGNOSIS — G4733 Obstructive sleep apnea (adult) (pediatric): Secondary | ICD-10-CM | POA: Diagnosis not present

## 2017-10-13 DIAGNOSIS — N186 End stage renal disease: Secondary | ICD-10-CM | POA: Diagnosis not present

## 2017-10-13 DIAGNOSIS — N2581 Secondary hyperparathyroidism of renal origin: Secondary | ICD-10-CM | POA: Diagnosis not present

## 2017-10-13 DIAGNOSIS — Z992 Dependence on renal dialysis: Secondary | ICD-10-CM | POA: Diagnosis not present

## 2017-10-13 DIAGNOSIS — E1122 Type 2 diabetes mellitus with diabetic chronic kidney disease: Secondary | ICD-10-CM | POA: Diagnosis not present

## 2017-10-13 DIAGNOSIS — D631 Anemia in chronic kidney disease: Secondary | ICD-10-CM | POA: Diagnosis not present

## 2017-10-13 DIAGNOSIS — D689 Coagulation defect, unspecified: Secondary | ICD-10-CM | POA: Diagnosis not present

## 2017-10-13 DIAGNOSIS — L299 Pruritus, unspecified: Secondary | ICD-10-CM | POA: Diagnosis not present

## 2017-10-15 DIAGNOSIS — N2581 Secondary hyperparathyroidism of renal origin: Secondary | ICD-10-CM | POA: Diagnosis not present

## 2017-10-15 DIAGNOSIS — E1122 Type 2 diabetes mellitus with diabetic chronic kidney disease: Secondary | ICD-10-CM | POA: Diagnosis not present

## 2017-10-15 DIAGNOSIS — N186 End stage renal disease: Secondary | ICD-10-CM | POA: Diagnosis not present

## 2017-10-15 DIAGNOSIS — L299 Pruritus, unspecified: Secondary | ICD-10-CM | POA: Diagnosis not present

## 2017-10-15 DIAGNOSIS — D631 Anemia in chronic kidney disease: Secondary | ICD-10-CM | POA: Diagnosis not present

## 2017-10-15 DIAGNOSIS — Z992 Dependence on renal dialysis: Secondary | ICD-10-CM | POA: Diagnosis not present

## 2017-10-15 DIAGNOSIS — D689 Coagulation defect, unspecified: Secondary | ICD-10-CM | POA: Diagnosis not present

## 2017-10-16 DIAGNOSIS — Z1211 Encounter for screening for malignant neoplasm of colon: Secondary | ICD-10-CM | POA: Diagnosis not present

## 2017-10-16 DIAGNOSIS — E789 Disorder of lipoprotein metabolism, unspecified: Secondary | ICD-10-CM | POA: Diagnosis not present

## 2017-10-16 DIAGNOSIS — R748 Abnormal levels of other serum enzymes: Secondary | ICD-10-CM | POA: Diagnosis not present

## 2017-10-16 DIAGNOSIS — E118 Type 2 diabetes mellitus with unspecified complications: Secondary | ICD-10-CM | POA: Diagnosis not present

## 2017-10-16 DIAGNOSIS — Z1212 Encounter for screening for malignant neoplasm of rectum: Secondary | ICD-10-CM | POA: Diagnosis not present

## 2017-10-16 DIAGNOSIS — N186 End stage renal disease: Secondary | ICD-10-CM | POA: Diagnosis not present

## 2017-10-17 DIAGNOSIS — D631 Anemia in chronic kidney disease: Secondary | ICD-10-CM | POA: Diagnosis not present

## 2017-10-17 DIAGNOSIS — E1122 Type 2 diabetes mellitus with diabetic chronic kidney disease: Secondary | ICD-10-CM | POA: Diagnosis not present

## 2017-10-17 DIAGNOSIS — L299 Pruritus, unspecified: Secondary | ICD-10-CM | POA: Diagnosis not present

## 2017-10-17 DIAGNOSIS — N186 End stage renal disease: Secondary | ICD-10-CM | POA: Diagnosis not present

## 2017-10-17 DIAGNOSIS — Z992 Dependence on renal dialysis: Secondary | ICD-10-CM | POA: Diagnosis not present

## 2017-10-17 DIAGNOSIS — N2581 Secondary hyperparathyroidism of renal origin: Secondary | ICD-10-CM | POA: Diagnosis not present

## 2017-10-17 DIAGNOSIS — D689 Coagulation defect, unspecified: Secondary | ICD-10-CM | POA: Diagnosis not present

## 2017-10-19 ENCOUNTER — Other Ambulatory Visit: Payer: Self-pay | Admitting: Internal Medicine

## 2017-10-19 DIAGNOSIS — Z1231 Encounter for screening mammogram for malignant neoplasm of breast: Secondary | ICD-10-CM

## 2017-10-20 DIAGNOSIS — Z992 Dependence on renal dialysis: Secondary | ICD-10-CM | POA: Diagnosis not present

## 2017-10-20 DIAGNOSIS — N186 End stage renal disease: Secondary | ICD-10-CM | POA: Diagnosis not present

## 2017-10-20 DIAGNOSIS — E1122 Type 2 diabetes mellitus with diabetic chronic kidney disease: Secondary | ICD-10-CM | POA: Diagnosis not present

## 2017-10-20 DIAGNOSIS — L299 Pruritus, unspecified: Secondary | ICD-10-CM | POA: Diagnosis not present

## 2017-10-20 DIAGNOSIS — D631 Anemia in chronic kidney disease: Secondary | ICD-10-CM | POA: Diagnosis not present

## 2017-10-20 DIAGNOSIS — D689 Coagulation defect, unspecified: Secondary | ICD-10-CM | POA: Diagnosis not present

## 2017-10-20 DIAGNOSIS — N2581 Secondary hyperparathyroidism of renal origin: Secondary | ICD-10-CM | POA: Diagnosis not present

## 2017-10-21 ENCOUNTER — Encounter

## 2017-10-21 ENCOUNTER — Ambulatory Visit: Payer: Self-pay | Admitting: Cardiovascular Disease

## 2017-10-22 DIAGNOSIS — E1122 Type 2 diabetes mellitus with diabetic chronic kidney disease: Secondary | ICD-10-CM | POA: Diagnosis not present

## 2017-10-22 DIAGNOSIS — L299 Pruritus, unspecified: Secondary | ICD-10-CM | POA: Diagnosis not present

## 2017-10-22 DIAGNOSIS — Z992 Dependence on renal dialysis: Secondary | ICD-10-CM | POA: Diagnosis not present

## 2017-10-22 DIAGNOSIS — D689 Coagulation defect, unspecified: Secondary | ICD-10-CM | POA: Diagnosis not present

## 2017-10-22 DIAGNOSIS — N186 End stage renal disease: Secondary | ICD-10-CM | POA: Diagnosis not present

## 2017-10-22 DIAGNOSIS — D631 Anemia in chronic kidney disease: Secondary | ICD-10-CM | POA: Diagnosis not present

## 2017-10-22 DIAGNOSIS — N2581 Secondary hyperparathyroidism of renal origin: Secondary | ICD-10-CM | POA: Diagnosis not present

## 2017-10-23 DIAGNOSIS — E118 Type 2 diabetes mellitus with unspecified complications: Secondary | ICD-10-CM | POA: Diagnosis not present

## 2017-10-23 DIAGNOSIS — N186 End stage renal disease: Secondary | ICD-10-CM | POA: Diagnosis not present

## 2017-10-24 DIAGNOSIS — D689 Coagulation defect, unspecified: Secondary | ICD-10-CM | POA: Diagnosis not present

## 2017-10-24 DIAGNOSIS — D631 Anemia in chronic kidney disease: Secondary | ICD-10-CM | POA: Diagnosis not present

## 2017-10-24 DIAGNOSIS — N2581 Secondary hyperparathyroidism of renal origin: Secondary | ICD-10-CM | POA: Diagnosis not present

## 2017-10-24 DIAGNOSIS — L299 Pruritus, unspecified: Secondary | ICD-10-CM | POA: Diagnosis not present

## 2017-10-24 DIAGNOSIS — Z992 Dependence on renal dialysis: Secondary | ICD-10-CM | POA: Diagnosis not present

## 2017-10-24 DIAGNOSIS — N186 End stage renal disease: Secondary | ICD-10-CM | POA: Diagnosis not present

## 2017-10-24 DIAGNOSIS — E1122 Type 2 diabetes mellitus with diabetic chronic kidney disease: Secondary | ICD-10-CM | POA: Diagnosis not present

## 2017-10-27 DIAGNOSIS — N186 End stage renal disease: Secondary | ICD-10-CM | POA: Diagnosis not present

## 2017-10-27 DIAGNOSIS — E1122 Type 2 diabetes mellitus with diabetic chronic kidney disease: Secondary | ICD-10-CM | POA: Diagnosis not present

## 2017-10-27 DIAGNOSIS — L299 Pruritus, unspecified: Secondary | ICD-10-CM | POA: Diagnosis not present

## 2017-10-27 DIAGNOSIS — D689 Coagulation defect, unspecified: Secondary | ICD-10-CM | POA: Diagnosis not present

## 2017-10-27 DIAGNOSIS — D631 Anemia in chronic kidney disease: Secondary | ICD-10-CM | POA: Diagnosis not present

## 2017-10-27 DIAGNOSIS — N2581 Secondary hyperparathyroidism of renal origin: Secondary | ICD-10-CM | POA: Diagnosis not present

## 2017-10-27 DIAGNOSIS — Z992 Dependence on renal dialysis: Secondary | ICD-10-CM | POA: Diagnosis not present

## 2017-10-29 DIAGNOSIS — N2581 Secondary hyperparathyroidism of renal origin: Secondary | ICD-10-CM | POA: Diagnosis not present

## 2017-10-29 DIAGNOSIS — D689 Coagulation defect, unspecified: Secondary | ICD-10-CM | POA: Diagnosis not present

## 2017-10-29 DIAGNOSIS — L299 Pruritus, unspecified: Secondary | ICD-10-CM | POA: Diagnosis not present

## 2017-10-29 DIAGNOSIS — N186 End stage renal disease: Secondary | ICD-10-CM | POA: Diagnosis not present

## 2017-10-29 DIAGNOSIS — E1122 Type 2 diabetes mellitus with diabetic chronic kidney disease: Secondary | ICD-10-CM | POA: Diagnosis not present

## 2017-10-29 DIAGNOSIS — D631 Anemia in chronic kidney disease: Secondary | ICD-10-CM | POA: Diagnosis not present

## 2017-10-29 DIAGNOSIS — Z992 Dependence on renal dialysis: Secondary | ICD-10-CM | POA: Diagnosis not present

## 2017-10-30 DIAGNOSIS — N186 End stage renal disease: Secondary | ICD-10-CM | POA: Diagnosis not present

## 2017-10-30 DIAGNOSIS — I129 Hypertensive chronic kidney disease with stage 1 through stage 4 chronic kidney disease, or unspecified chronic kidney disease: Secondary | ICD-10-CM | POA: Diagnosis not present

## 2017-10-30 DIAGNOSIS — Z992 Dependence on renal dialysis: Secondary | ICD-10-CM | POA: Diagnosis not present

## 2017-10-31 DIAGNOSIS — D689 Coagulation defect, unspecified: Secondary | ICD-10-CM | POA: Diagnosis not present

## 2017-10-31 DIAGNOSIS — N2581 Secondary hyperparathyroidism of renal origin: Secondary | ICD-10-CM | POA: Diagnosis not present

## 2017-10-31 DIAGNOSIS — D631 Anemia in chronic kidney disease: Secondary | ICD-10-CM | POA: Diagnosis not present

## 2017-10-31 DIAGNOSIS — N186 End stage renal disease: Secondary | ICD-10-CM | POA: Diagnosis not present

## 2017-10-31 DIAGNOSIS — E1122 Type 2 diabetes mellitus with diabetic chronic kidney disease: Secondary | ICD-10-CM | POA: Diagnosis not present

## 2017-10-31 DIAGNOSIS — Z992 Dependence on renal dialysis: Secondary | ICD-10-CM | POA: Diagnosis not present

## 2017-11-03 DIAGNOSIS — N186 End stage renal disease: Secondary | ICD-10-CM | POA: Diagnosis not present

## 2017-11-03 DIAGNOSIS — D631 Anemia in chronic kidney disease: Secondary | ICD-10-CM | POA: Diagnosis not present

## 2017-11-03 DIAGNOSIS — Z992 Dependence on renal dialysis: Secondary | ICD-10-CM | POA: Diagnosis not present

## 2017-11-03 DIAGNOSIS — E1122 Type 2 diabetes mellitus with diabetic chronic kidney disease: Secondary | ICD-10-CM | POA: Diagnosis not present

## 2017-11-03 DIAGNOSIS — D689 Coagulation defect, unspecified: Secondary | ICD-10-CM | POA: Diagnosis not present

## 2017-11-03 DIAGNOSIS — N2581 Secondary hyperparathyroidism of renal origin: Secondary | ICD-10-CM | POA: Diagnosis not present

## 2017-11-05 DIAGNOSIS — N186 End stage renal disease: Secondary | ICD-10-CM | POA: Diagnosis not present

## 2017-11-05 DIAGNOSIS — D689 Coagulation defect, unspecified: Secondary | ICD-10-CM | POA: Diagnosis not present

## 2017-11-05 DIAGNOSIS — Z992 Dependence on renal dialysis: Secondary | ICD-10-CM | POA: Diagnosis not present

## 2017-11-05 DIAGNOSIS — E1122 Type 2 diabetes mellitus with diabetic chronic kidney disease: Secondary | ICD-10-CM | POA: Diagnosis not present

## 2017-11-05 DIAGNOSIS — N2581 Secondary hyperparathyroidism of renal origin: Secondary | ICD-10-CM | POA: Diagnosis not present

## 2017-11-05 DIAGNOSIS — D631 Anemia in chronic kidney disease: Secondary | ICD-10-CM | POA: Diagnosis not present

## 2017-11-07 DIAGNOSIS — N186 End stage renal disease: Secondary | ICD-10-CM | POA: Diagnosis not present

## 2017-11-07 DIAGNOSIS — D689 Coagulation defect, unspecified: Secondary | ICD-10-CM | POA: Diagnosis not present

## 2017-11-07 DIAGNOSIS — Z992 Dependence on renal dialysis: Secondary | ICD-10-CM | POA: Diagnosis not present

## 2017-11-07 DIAGNOSIS — N2581 Secondary hyperparathyroidism of renal origin: Secondary | ICD-10-CM | POA: Diagnosis not present

## 2017-11-07 DIAGNOSIS — D631 Anemia in chronic kidney disease: Secondary | ICD-10-CM | POA: Diagnosis not present

## 2017-11-07 DIAGNOSIS — E1122 Type 2 diabetes mellitus with diabetic chronic kidney disease: Secondary | ICD-10-CM | POA: Diagnosis not present

## 2017-11-08 DIAGNOSIS — K6389 Other specified diseases of intestine: Secondary | ICD-10-CM | POA: Diagnosis not present

## 2017-11-08 DIAGNOSIS — K5730 Diverticulosis of large intestine without perforation or abscess without bleeding: Secondary | ICD-10-CM | POA: Diagnosis not present

## 2017-11-08 DIAGNOSIS — R195 Other fecal abnormalities: Secondary | ICD-10-CM | POA: Diagnosis not present

## 2017-11-08 DIAGNOSIS — D123 Benign neoplasm of transverse colon: Secondary | ICD-10-CM | POA: Diagnosis not present

## 2017-11-08 DIAGNOSIS — K649 Unspecified hemorrhoids: Secondary | ICD-10-CM | POA: Diagnosis not present

## 2017-11-09 ENCOUNTER — Other Ambulatory Visit: Payer: Self-pay | Admitting: Gastroenterology

## 2017-11-10 DIAGNOSIS — Z992 Dependence on renal dialysis: Secondary | ICD-10-CM | POA: Diagnosis not present

## 2017-11-10 DIAGNOSIS — D689 Coagulation defect, unspecified: Secondary | ICD-10-CM | POA: Diagnosis not present

## 2017-11-10 DIAGNOSIS — N2581 Secondary hyperparathyroidism of renal origin: Secondary | ICD-10-CM | POA: Diagnosis not present

## 2017-11-10 DIAGNOSIS — N186 End stage renal disease: Secondary | ICD-10-CM | POA: Diagnosis not present

## 2017-11-10 DIAGNOSIS — D631 Anemia in chronic kidney disease: Secondary | ICD-10-CM | POA: Diagnosis not present

## 2017-11-10 DIAGNOSIS — E1122 Type 2 diabetes mellitus with diabetic chronic kidney disease: Secondary | ICD-10-CM | POA: Diagnosis not present

## 2017-11-11 ENCOUNTER — Encounter

## 2017-11-11 ENCOUNTER — Ambulatory Visit: Payer: Medicare Other | Admitting: Cardiovascular Disease

## 2017-11-11 ENCOUNTER — Encounter: Payer: Self-pay | Admitting: Cardiovascular Disease

## 2017-11-11 VITALS — BP 122/72 | HR 77 | Ht 66.0 in | Wt 344.0 lb

## 2017-11-11 DIAGNOSIS — G4733 Obstructive sleep apnea (adult) (pediatric): Secondary | ICD-10-CM

## 2017-11-11 DIAGNOSIS — I5032 Chronic diastolic (congestive) heart failure: Secondary | ICD-10-CM

## 2017-11-11 DIAGNOSIS — I10 Essential (primary) hypertension: Secondary | ICD-10-CM | POA: Diagnosis not present

## 2017-11-11 DIAGNOSIS — E785 Hyperlipidemia, unspecified: Secondary | ICD-10-CM

## 2017-11-11 DIAGNOSIS — Z992 Dependence on renal dialysis: Secondary | ICD-10-CM | POA: Diagnosis not present

## 2017-11-11 DIAGNOSIS — N186 End stage renal disease: Secondary | ICD-10-CM

## 2017-11-11 DIAGNOSIS — R6889 Other general symptoms and signs: Secondary | ICD-10-CM | POA: Diagnosis not present

## 2017-11-11 NOTE — Patient Instructions (Signed)
Your physician wants you to follow-up in: 1 year with Dr. Kelly (sleep clinic). You will receive a reminder letter in the mail two months in advance. If you don't receive a letter, please call our office to schedule the follow-up appointment.  

## 2017-11-11 NOTE — Progress Notes (Addendum)
Cardiology Office Note    Date:  11/13/2017   ID:  [name], DOB 10/05/1955, MRN [id number]  PCP:  Deland Pretty, MD  Cardiologist:  Shelva Majestic, MD   Chief Complaint  Patient presents with  . Follow-up    History of Present Illness:  [name] is a 62 y.o. female who referred by Dr. Wilson Singer for me to assume care of her obstructive sleep apnea.  She had establish cardiology care with me in March 2018. I last saw her in October 2018. She presents for 7 month follow-up cardiology/sleep evaluation.  Ms Cephus was seen by Dr. Irish Lack in May 2017.  She has a history of hypertension, hyperlipidemia, obstructive sleep apnea, stage IV renal disease and was not yet on dialysis.  She has had heart failure symptoms and volume overload.  She was admitted to [facility] hospital in May 2017 for acute on chronic heart failure in the setting of worsening renal failure.  During that hospitalization, troponins were elevated.  An echo Doppler study showed an EF of 55-60%, severe LVH, and mild PA hypertension.  She was started on hemodialysis and required AV fistula revision.  She was transfused due to worsening edema and ultimately was hospitalized for close to one month and later discharged to skilled nursing facility.  She was seen in follow-up in the office and was tachycardic.  She was last seen in August 2017 by how Main, PA and was having some mild dizziness after dialysis. She was instructed to hold the night dose of Toprol prior to her dialysis session.  The patient has requested establishment of care with me and presents for evaluation.  She denies any episodes of chest tightness.  She denies recent episodes of dizziness.  Her volume overload has resolved with implementation of dialysis.  She has a history of obstructive sleep apnea and was followed remotely at [facility] by Dr. Shelia Media.  She had undergone a sleep study at [place] in 2009.  Overall AHI was 12.8, but she had  severe sleep apnea with rim sleep at 43.9.  Remotely, she had been set at a 14 cm pressure and with [facility] as her DME.  Her last download was in 2012.  She had not been using CPAP, regular labor recently has increased usage.  Without CPAP therapy.  She would wake up every 2 hours.  Her sleep has improved with reinstitution of treatment.  She typically goes to bed at 11 PM and wakes up at 7 AM.  She is followed by Dr. Florene Glen for renal disease.  When I saw her in March 2018 she continued to have excessive daytime sleepiness and an Epworth Sleepiness Scale score endorsed at 13.  Epworth Sleepiness Scale: Situation   Chance of Dozing/Sleeping (0 = never , 1 = slight chance , 2 = moderate chance , 3 = high chance )   sitting and reading 2   watching TV 3   sitting inactive in a public place 1   being a passenger in a motor vehicle for an hour or more 1   lying down in the afternoon 2   sitting and talking to someone 1   sitting quietly after lunch (no alcohol) 2   while stopped for a few minutes in traffic as the driver 1   Total Score  13   Since it had been over 9 years since her initial sleep evaluation.  I recommended a new sleep evaluation and machine.  The sleep study was done on 01/30/2007 which showed moderate sleep apnea were all within AHI of 19.7 per hour.  However, her sleep apnea was very severe doing REM sleep with an HI of 43.6 per hour.  She had significant oxygen desaturation to a nadir of 81% and there was moderate snoring.    I last saw her, she underwent her CPAP titration study on April 26, 2017.  Her new CPAP set up date was June 15, 2017 and [facility] is her DME company.  She is now sleeping much better.  She has a [employer] Simplus medium full facemask.  A new download was obtained in the office today from Oct 07, 2017 through November 05, 2017.  She is 100% compliant.  However sleep duration is only 5 hours and 25 minutes.  AHI is 3.4 on her 9 cm water  pressure.  There is no leak.  Typically she goes to bed at 10:30 but wakes up at 3:45 on dialysis days.  As result she is not sleeping for long enough duration.  She is unaware of breakthrough snoring.  Past Medical History:  Diagnosis Date  . Arthritis   . Chronic diastolic CHF (congestive heart failure) ([place])    a. Echo 5/17: severe LVH, EF 55-60%, mild AS, mean AV gradient 10 mmHg, mod LAE, PASP 37 mmHg  . Diabetes mellitus without complication ([place])    type 2  . ESRD (end stage renal disease) ([facility])    Tues, Thurs, Sat dialysis  . H/O blood clots    many years ago in leg  . History of pneumonia   . HLD (hyperlipidemia)   . Hypertension    BP low after dialysis started, no further BP meds  . Seizures ([place])    had one or two (more than 10-15 years ago)  . Sleep apnea    uses c-pap most of time    Past Surgical History:  Procedure Laterality Date  . ABDOMINAL HYSTERECTOMY    . AV FISTULA PLACEMENT Left 07/25/2015   Procedure: ARTERIOVENOUS (AV) FISTULA CREATION -LEFT BRACHIOCEPHALIC;  Surgeon: Conrad Isabel, MD;  Location: [place];  Service: Vascular;  Laterality: Left;  . COLONOSCOPY    . FISTULA SUPERFICIALIZATION Left 11/01/2015   Procedure: SUPERFICIALIZATION OF LEFT ARM BRACHIOCEPHALIC FISTULA ;  Surgeon: Conrad , MD;  Location: [place];  Service: Vascular;  Laterality: Left;  . INSERTION OF DIALYSIS CATHETER Right 10/09/2015   Procedure: INSERTION OF DIALYSIS CATHETER RIGHT INTERNAL JUGULAR ;  Surgeon: Angelia Mould, MD;  Location: [facility] OR;  Service: Vascular;  Laterality: Right;    Current Medications: Outpatient Medications Prior to Visit  Medication Sig Dispense Refill  . aspirin 81 MG tablet Take 81 mg by mouth daily.    Marland Kitchen atorvastatin (LIPITOR) 40 MG tablet Take 40 mg by mouth daily.    . cinacalcet (SENSIPAR) 60 MG tablet Take 60 mg by mouth daily.    Marland Kitchen docusate sodium (COLACE) 50 MG capsule Take 100 mg by mouth 2 (two) times daily.     . febuxostat (ULORIC) 40  MG tablet Take 40 mg by mouth daily.    Marland Kitchen FOSRENOL 1000 MG chewable tablet Chew 1,000 mg by mouth 3 (three) times daily with meals.    Marland Kitchen loratadine (CLARITIN) 10 MG tablet Take 1 tablet by mouth daily.     . metoprolol succinate (TOPROL-XL) 50 MG 24 hr tablet Take 50 mg by mouth every other day. DO NOT TAKE THE NIGHT  PRIOR TO YOUR DIALYSIS  0  . multivitamin (RENA-VIT) TABS tablet Take 1 tablet by mouth daily. 30 each 0  . ONETOUCH VERIO test strip TEST 2 TIMES A DAY AND AS NEEDED FOR BLOOD SUGAR  0  . TRESIBA FLEXTOUCH 100 UNIT/ML SOPN FlexTouch Pen Inject 14 Units into the skin daily. Dinner  0  . senna-docusate (SENOKOT-S) 8.6-50 MG tablet Take 2 tablets by mouth 2 (two) times daily. 120 tablet 0   No facility-administered medications prior to visit.      Allergies:   Morphine and related   Social History   Socioeconomic History  . Marital status: Single    Spouse name: Not on file  . Number of children: Not on file  . Years of education: Not on file  . Highest education level: Not on file  Occupational History  . Not on file  Social Needs  . Financial resource strain: Not on file  . Food insecurity:    Worry: Not on file    Inability: Not on file  . Transportation needs:    Medical: Not on file    Non-medical: Not on file  Tobacco Use  . Smoking status: Never Smoker  . Smokeless tobacco: Never Used  Substance and Sexual Activity  . Alcohol use: No    Alcohol/week: 0.0 oz  . Drug use: No  . Sexual activity: Not on file  Lifestyle  . Physical activity:    Days per week: Not on file    Minutes per session: Not on file  . Stress: Not on file  Relationships  . Social connections:    Talks on phone: Not on file    Gets together: Not on file    Attends religious service: Not on file    Active member of club or organization: Not on file    Attends meetings of clubs or organizations: Not on file    Relationship status: Not on file  Other Topics Concern  . Not on file    Social History Narrative   epworth sleepiness scale score 12    I have taken care of her family member, Nadara Mustard  Family History:  The patient's family history includes Cancer in her mother; Heart attack in her paternal aunt and sister; Hypertension in her brother, brother, father, sister, sister, sister, and sister.   ROS General: Negative; No fevers, chills, or night sweats; positive for super morbid obesity HEENT: Negative; No changes in vision or hearing, sinus congestion, difficulty swallowing Pulmonary: Negative; No cough, wheezing, shortness of breath, hemoptysis Cardiovascular: Negative; No chest pain, presyncope, syncope, palpitations GI: Negative; No nausea, vomiting, diarrhea, or abdominal pain GU: End-stage renal disease, now on dialysis on Tuesdays, Thursdays and Saturdays Musculoskeletal: Negative; no myalgias, joint pain, or weakness Hematologic/Oncology: Negative; no easy bruising, bleeding Endocrine: Positive for diabetes mellitus Neuro: Negative; no changes in balance, headaches Skin: Negative; No rashes or skin lesions Psychiatric: Negative; No behavioral problems, depression Sleep: Positive for OSA documented in 2009.  She had not been using CPAP but has recently restarted using therapy, daytime sleepiness, hypersomnolence;  no bruxism, restless legs, hypnogognic hallucinations, no cataplexy.  New sleep study was reviewed.  CPAP titration is outstanding Other comprehensive 14 point system review is negative.   PHYSICAL EXAM:   VS:  BP 122/72 (BP Location: Right Arm, Patient Position: Sitting, Cuff Size: Large)   Pulse 77   Ht _0  (1.676 m)   Wt (!) 344 lb (156 kg)   BMI 55.52  kg/m     Repeat blood pressure was 116/70  Wt Readings from Last 3 Encounters:  11/11/17 (!) 344 lb (156 kg)  04/26/17 (!) 342 lb (155.1 kg)  03/04/17 (!) 342 lb 9.6 oz (155.4 kg)    General: Alert, oriented, no distress.  Skin: normal turgor, no rashes, warm and dry HEENT:  Normocephalic, atraumatic. Pupils equal round and reactive to light; sclera anicteric; extraocular muscles intact;  Nose without nasal septal hypertrophy Mouth/Parynx benign; Mallinpatti scale 3 Neck: No JVD, no carotid bruits; normal carotid upstroke Lungs: clear to ausculatation and percussion; no wheezing or rales Chest wall: without tenderness to palpitation Heart: PMI not displaced, RRR, s1 s2 normal, 1/6 systolic murmur, no diastolic murmur, no rubs, gallops, thrills, or heaves Abdomen: soft, nontender; no hepatosplenomehaly, BS+; abdominal aorta nontender and not dilated by palpation. Back: no CVA tenderness Pulses 2+ Musculoskeletal: full range of motion, normal strength, no joint deformities Extremities: Left upper extremity AV fistula; no clubbing cyanosis or edema, Homan's sign negative  Neurologic: grossly nonfocal; Cranial nerves grossly wnl Psychologic: Normal mood and affect    Studies/Labs Reviewed:   ECG (independently read by me): Sinus rhythm at 77 bpm.  Poor anterior R wave progression.  No ectopy.  QTc interval 468 ms.  October 2018 ECG (independently read by me): Normal sinus rhythm at 79 bpm..  No ST segment changes.  QTc interval 463 ms.  June 2018 EKG:  EKG is ordered today. Sinus tachycardia 105 bpm.  QTc interval 460 ms.  Q wave in lead 3.  PR interval 182 ms.  March 2018 ECG (independently read by me): Normal sinus rhythm at 75 bpm.  Nonspecific ST changes.  Normal intervals.  Recent Labs: BMP Latest Ref Rng & Units 11/06/2015 11/03/2015 11/01/2015  Glucose 65 - 99 mg/dL 97 87 80  BUN 6 - 20 mg/dL 25(H) 20 -  Creatinine 0.44 - 1.00 mg/dL 8.13(H) 7.41(H) -  Sodium 135 - 145 mmol/L 135 136 132(L)  Potassium 3.5 - 5.1 mmol/L 4.3 3.8 4.2  Chloride 101 - 111 mmol/L 96(L) 97(L) -  CO2 22 - 32 mmol/L 30 28 -  Calcium 8.9 - 10.3 mg/dL 8.2(L) 8.3(L) -     Hepatic Function Latest Ref Rng & Units 11/06/2015 11/03/2015 10/30/2015  Total Protein 6.5 - 8.1 g/dL - - -    Albumin 3.5 - 5.0 g/dL 2.6(L) 2.6(L) 2.6(L)  AST 15 - 41 U/L - - -  ALT 14 - 54 U/L - - -  Alk Phosphatase 38 - 126 U/L - - -  Total Bilirubin 0.3 - 1.2 mg/dL - - -    CBC Latest Ref Rng & Units 11/06/2015 11/03/2015 11/01/2015  WBC 4.0 - 10.5 K/uL 6.2 7.0 -  Hemoglobin 12.0 - 15.0 g/dL 10.0(L) 10.0(L) 12.9  Hematocrit 36.0 - 46.0 % 33.4(L) 32.7(L) 38.0  Platelets 150 - 400 K/uL 245 199 -   Lab Results  Component Value Date   MCV 89.8 11/06/2015   MCV 88.9 11/03/2015   MCV 90.3 10/30/2015   Lab Results  Component Value Date   TSH 1.541 10/07/2015   Lab Results  Component Value Date   HGBA1C 6.2 (H) 10/07/2015     BNP    Component Value Date/Time   BNP 464.4 (H) 10/04/2015 2319    ProBNP No results found for: PROBNP   Lipid Panel  No results found for: CHOL, TRIG, HDL, CHOLHDL, VLDL, LDLCALC, LDLDIRECT   RADIOLOGY: No results found.   Additional studies/ records  that were reviewed today include:  I have reviewed the office notes from her prior providers.  I reviewed her hospitalization.  I reviewed laboratory.  I have reviewed her sleep study from [facility] and the notes of Dr. Gareth Eagle  I generated a new download from Oct 07, 2017 through November 05, 2017  ASSESSMENT:    1. Essential hypertension   2. OSA (obstructive sleep apnea)   3. ESRD on dialysis ([place])   4. Chronic diastolic CHF (congestive heart failure) ([place])   5. Hyperlipidemia with target LDL less than 70   6. Morbid obesity, unspecified obesity type [facility])      PLAN:  Ms. Bures is a 62 year-old female who is a history of hypertension, hyperlipidemia, obstructive sleep apnea, super morbid obesity and end-stage renal disease.  She has been on dialysis since May 2017 and currently going for dialysis on Tuesdays, Thursdays and Saturdays.  Prior to that, she had issues with significant volume overload and acute on chronic diastolic heart failure.  Since being on dialysis, her volume status  has stabilized.  She transiently had had issues with dialysis and her medications have been reduced.  When I initially saw her, her blood pressure was low and she was tachycardic.  Her blood pressure today is stable on her regimen consisting of metoprolol 50 mg but she does not take this the night prior to her dialysis.  She continues to be on atorvastatin for hyperlipidemia with target LDL less than 70.  I again reviewed her most recent sleep study which confirmed moderate overall sleep apnea but this was very severe during REM sleep.  There was significant oxygen desaturation to 81%.  He has now received a new CPAP machine following her CPAP titration.  She is 100% compliant both with usage days and usage greater than 4 hours; however, she is not sleeping for adequate duration and only 5 hours and 25 minutes.  Typically she gets up at 345 on her dialysis days and has to take a bus to go to the dialysis center.  I have recommended that she must go to bed by 9 PM the absolute latest on those nights prior to dialysis.  There is no mask leak.  She feels well.  She is unaware of breakthrough snoring.  Her sleep is restorative.  However, her Epworth Sleepiness Scale score endorsed today at 11 which is not due to her untreated sleep apnea but mainly due to her reduced sleep duration.   Medication Adjustments/Labs and Tests Ordered: Current medicines are reviewed at length with the patient today.  Concerns regarding medicines are outlined above.  Medication changes, Labs and Tests ordered today are listed in the Patient Instructions below. Patient Instructions  Your physician wants you to follow-up in: 1 year with Dr. Claiborne Billings (sleep clinic). You will receive a reminder letter in the mail two months in advance. If you don't receive a letter, please call our office to schedule the follow-up appointment.    Signed, Shelva Majestic, MD, [facility] 11/13/2017 2:57 PM    [facility] [address],  [address], [place], [place]  [address] Phone: [phone]

## 2017-11-12 DIAGNOSIS — E1122 Type 2 diabetes mellitus with diabetic chronic kidney disease: Secondary | ICD-10-CM | POA: Diagnosis not present

## 2017-11-12 DIAGNOSIS — D631 Anemia in chronic kidney disease: Secondary | ICD-10-CM | POA: Diagnosis not present

## 2017-11-12 DIAGNOSIS — D689 Coagulation defect, unspecified: Secondary | ICD-10-CM | POA: Diagnosis not present

## 2017-11-12 DIAGNOSIS — N186 End stage renal disease: Secondary | ICD-10-CM | POA: Diagnosis not present

## 2017-11-12 DIAGNOSIS — N2581 Secondary hyperparathyroidism of renal origin: Secondary | ICD-10-CM | POA: Diagnosis not present

## 2017-11-12 DIAGNOSIS — Z992 Dependence on renal dialysis: Secondary | ICD-10-CM | POA: Diagnosis not present

## 2017-11-13 ENCOUNTER — Encounter: Payer: Self-pay | Admitting: Cardiovascular Disease

## 2017-11-13 DIAGNOSIS — G4733 Obstructive sleep apnea (adult) (pediatric): Secondary | ICD-10-CM | POA: Diagnosis not present

## 2017-11-13 NOTE — Addendum Note (Signed)
Addended by: Shelva Majestic A on: 11/13/2017 02:58 PM   Modules accepted: Level of Service

## 2017-11-14 DIAGNOSIS — D689 Coagulation defect, unspecified: Secondary | ICD-10-CM | POA: Diagnosis not present

## 2017-11-14 DIAGNOSIS — Z992 Dependence on renal dialysis: Secondary | ICD-10-CM | POA: Diagnosis not present

## 2017-11-14 DIAGNOSIS — N2581 Secondary hyperparathyroidism of renal origin: Secondary | ICD-10-CM | POA: Diagnosis not present

## 2017-11-14 DIAGNOSIS — E1122 Type 2 diabetes mellitus with diabetic chronic kidney disease: Secondary | ICD-10-CM | POA: Diagnosis not present

## 2017-11-14 DIAGNOSIS — N186 End stage renal disease: Secondary | ICD-10-CM | POA: Diagnosis not present

## 2017-11-14 DIAGNOSIS — D631 Anemia in chronic kidney disease: Secondary | ICD-10-CM | POA: Diagnosis not present

## 2017-11-17 ENCOUNTER — Other Ambulatory Visit: Payer: Self-pay | Admitting: Gastroenterology

## 2017-11-17 DIAGNOSIS — N186 End stage renal disease: Secondary | ICD-10-CM | POA: Diagnosis not present

## 2017-11-17 DIAGNOSIS — D631 Anemia in chronic kidney disease: Secondary | ICD-10-CM | POA: Diagnosis not present

## 2017-11-17 DIAGNOSIS — E1122 Type 2 diabetes mellitus with diabetic chronic kidney disease: Secondary | ICD-10-CM | POA: Diagnosis not present

## 2017-11-17 DIAGNOSIS — N2581 Secondary hyperparathyroidism of renal origin: Secondary | ICD-10-CM | POA: Diagnosis not present

## 2017-11-17 DIAGNOSIS — D689 Coagulation defect, unspecified: Secondary | ICD-10-CM | POA: Diagnosis not present

## 2017-11-17 DIAGNOSIS — Z992 Dependence on renal dialysis: Secondary | ICD-10-CM | POA: Diagnosis not present

## 2017-11-18 ENCOUNTER — Ambulatory Visit (HOSPITAL_COMMUNITY)
Admission: RE | Admit: 2017-11-18 | Discharge: 2017-11-18 | Disposition: A | Discharge status: Home / Self Care | Payer: Medicare Other | Source: Ambulatory Visit | Attending: Gastroenterology | Admitting: Gastroenterology

## 2017-11-18 ENCOUNTER — Other Ambulatory Visit: Payer: Self-pay

## 2017-11-18 ENCOUNTER — Encounter (HOSPITAL_COMMUNITY): Payer: Self-pay | Admitting: *Deleted

## 2017-11-18 ENCOUNTER — Ambulatory Visit (HOSPITAL_COMMUNITY): Payer: Medicare Other | Admitting: Anesthesiology

## 2017-11-18 ENCOUNTER — Encounter (HOSPITAL_COMMUNITY)
Admission: RE | Disposition: A | Discharge status: Home / Self Care | Payer: Self-pay | Source: Ambulatory Visit | Attending: Gastroenterology

## 2017-11-18 ENCOUNTER — Ambulatory Visit: Payer: Self-pay

## 2017-11-18 DIAGNOSIS — E785 Hyperlipidemia, unspecified: Secondary | ICD-10-CM | POA: Diagnosis not present

## 2017-11-18 DIAGNOSIS — K6389 Other specified diseases of intestine: Secondary | ICD-10-CM | POA: Diagnosis not present

## 2017-11-18 DIAGNOSIS — R195 Other fecal abnormalities: Secondary | ICD-10-CM | POA: Diagnosis present

## 2017-11-18 DIAGNOSIS — Z992 Dependence on renal dialysis: Secondary | ICD-10-CM | POA: Diagnosis not present

## 2017-11-18 DIAGNOSIS — D123 Benign neoplasm of transverse colon: Secondary | ICD-10-CM | POA: Insufficient documentation

## 2017-11-18 DIAGNOSIS — N186 End stage renal disease: Secondary | ICD-10-CM | POA: Insufficient documentation

## 2017-11-18 DIAGNOSIS — Z79899 Other long term (current) drug therapy: Secondary | ICD-10-CM | POA: Diagnosis not present

## 2017-11-18 DIAGNOSIS — K649 Unspecified hemorrhoids: Secondary | ICD-10-CM | POA: Diagnosis not present

## 2017-11-18 DIAGNOSIS — G4730 Sleep apnea, unspecified: Secondary | ICD-10-CM | POA: Diagnosis not present

## 2017-11-18 DIAGNOSIS — K5730 Diverticulosis of large intestine without perforation or abscess without bleeding: Secondary | ICD-10-CM | POA: Insufficient documentation

## 2017-11-18 DIAGNOSIS — E119 Type 2 diabetes mellitus without complications: Secondary | ICD-10-CM | POA: Diagnosis not present

## 2017-11-18 DIAGNOSIS — Z7982 Long term (current) use of aspirin: Secondary | ICD-10-CM | POA: Insufficient documentation

## 2017-11-18 DIAGNOSIS — I120 Hypertensive chronic kidney disease with stage 5 chronic kidney disease or end stage renal disease: Secondary | ICD-10-CM | POA: Insufficient documentation

## 2017-11-18 DIAGNOSIS — E1122 Type 2 diabetes mellitus with diabetic chronic kidney disease: Secondary | ICD-10-CM | POA: Diagnosis not present

## 2017-11-18 HISTORY — PX: POLYPECTOMY: SHX5525

## 2017-11-18 HISTORY — PX: COLONOSCOPY WITH PROPOFOL: SHX5780

## 2017-11-18 LAB — POCT I-STAT 4, (NA,K, GLUC, HGB,HCT)
GLUCOSE: 112 mg/dL — AB (ref 65–99)
HEMATOCRIT: 42 % (ref 36.0–46.0)
HEMOGLOBIN: 14.3 g/dL (ref 12.0–15.0)
POTASSIUM: 4.2 mmol/L (ref 3.5–5.1)
Sodium: 138 mmol/L (ref 135–145)

## 2017-11-18 SURGERY — COLONOSCOPY WITH PROPOFOL
Anesthesia: Monitor Anesthesia Care

## 2017-11-18 MED ORDER — PROPOFOL 500 MG/50ML IV EMUL
INTRAVENOUS | Status: DC | PRN
  Administered 2017-11-18: 100 ug/kg/min via INTRAVENOUS

## 2017-11-18 MED ORDER — SODIUM CHLORIDE 0.9 % IV SOLN
INTRAVENOUS | Status: DC
  Administered 2017-11-18 (×2): via INTRAVENOUS

## 2017-11-18 MED ORDER — PHENYLEPHRINE HCL 10 MG/ML IJ SOLN
INTRAMUSCULAR | Status: DC | PRN
  Administered 2017-11-18 (×5): 80 ug via INTRAVENOUS

## 2017-11-18 MED ORDER — SODIUM CHLORIDE 0.9 % IV SOLN: INTRAVENOUS | Status: DC

## 2017-11-18 MED ORDER — LIDOCAINE HCL (CARDIAC) PF 100 MG/5ML IV SOSY
PREFILLED_SYRINGE | INTRAVENOUS | Status: DC | PRN
  Administered 2017-11-18 (×2): 50 mg via INTRAVENOUS

## 2017-11-18 MED ORDER — PROPOFOL 10 MG/ML IV BOLUS
INTRAVENOUS | Status: AC
  Filled 2017-11-18: qty 60

## 2017-11-18 MED ORDER — GLYCOPYRROLATE 0.2 MG/ML IJ SOLN
INTRAMUSCULAR | Status: DC | PRN
  Administered 2017-11-18: 0.1 mg via INTRAVENOUS

## 2017-11-18 MED ORDER — PROPOFOL 500 MG/50ML IV EMUL
INTRAVENOUS | Status: DC | PRN
  Administered 2017-11-18: 30 mg via INTRAVENOUS

## 2017-11-18 SURGICAL SUPPLY — 22 items

## 2017-11-18 NOTE — Transfer of Care (Signed)
Immediate Anesthesia Transfer of Care Note  Patient: [name]  Procedure(s) Performed: COLONOSCOPY WITH PROPOFOL (N/A ) POLYPECTOMY  Patient Location: PACU and Endoscopy Unit  Anesthesia Type:MAC  Level of Consciousness: awake, alert  and patient cooperative  Airway & Oxygen Therapy: Patient Spontanous Breathing and Patient connected to face mask oxygen  Post-op Assessment: Report given to RN and Post -op Vital signs reviewed and stable  Post vital signs: Reviewed and stable  Last Vitals:  Vitals Value Taken Time  BP 84/43 11/18/2017 11:47 AM  Temp    Pulse 110 11/18/2017 11:49 AM  Resp 20 11/18/2017 11:49 AM  SpO2 100 % 11/18/2017 11:49 AM  Vitals shown include unvalidated device data.  Last Pain:  Vitals:   11/18/17 1010  TempSrc: Oral  PainSc: 0-No pain         Complications: No apparent anesthesia complications

## 2017-11-18 NOTE — Anesthesia Preprocedure Evaluation (Addendum)
Anesthesia Evaluation  Patient identified by MRN, date of birth, ID band Patient awake    Reviewed: Allergy & Precautions, NPO status , Patient's Chart, lab work & pertinent test results  Airway Mallampati: II  TM Distance: >3 FB Neck ROM: Full    Dental   Pulmonary sleep apnea ,    Pulmonary exam normal        Cardiovascular hypertension, Pt. on medications Normal cardiovascular exam     Neuro/Psych    GI/Hepatic   Endo/Other  diabetes, Type 2, Oral Hypoglycemic Agents  Renal/GU Renal InsufficiencyRenal disease     Musculoskeletal   Abdominal   Peds  Hematology   Anesthesia Other Findings   Reproductive/Obstetrics                             Anesthesia Physical Anesthesia Plan  ASA: III  Anesthesia Plan: MAC   Post-op Pain Management:    Induction: Intravenous  PONV Risk Score and Plan: 2 and Treatment may vary due to age or medical condition  Airway Management Planned: Simple Face Mask  Additional Equipment:   Intra-op Plan:   Post-operative Plan:   Informed Consent: I have reviewed the patients History and Physical, chart, labs and discussed the procedure including the risks, benefits and alternatives for the proposed anesthesia with the patient or authorized representative who has indicated his/her understanding and acceptance.     Plan Discussed with: CRNA and Surgeon  Anesthesia Plan Comments:         Anesthesia Quick Evaluation

## 2017-11-18 NOTE — Discharge Instructions (Signed)
Colonoscopy ° °Post procedure instructions: ° °Read the instructions outlined below and refer to this sheet in the next few weeks. These discharge instructions provide you with general information on caring for yourself after you leave the hospital. Your doctor may also give you specific instructions. While your treatment has been planned according to the most current medical practices available, unavoidable complications occasionally occur. If you have any problems or questions after discharge, call Dr. Aleigha Gilani at [facility] ([phone]). ° °HOME CARE INSTRUCTIONS ° °ACTIVITY: °· You may resume your regular activity, but move at a slower pace for the next 24 hours.  °· Take frequent rest periods for the next 24 hours.  °· Walking will help get rid of the air and reduce the bloated feeling in your belly (abdomen).  °· No driving for 24 hours (because of the medicine (anesthesia) used during the test).  °· You may shower.  °· Do not sign any important legal documents or operate any machinery for 24 hours (because of the anesthesia used during the test).  °NUTRITION: °· Drink plenty of fluids.  °· You may resume your normal diet as instructed by your doctor.  °· Begin with a light meal and progress to your normal diet. Heavy or fried foods are harder to digest and may make you feel sick to your stomach (nauseated).  °· Avoid alcoholic beverages for 24 hours or as instructed.  °MEDICATIONS: °· You may resume your normal medications unless your doctor tells you otherwise.  °WHAT TO EXPECT TODAY: °· Some feelings of bloating in the abdomen.  °· Passage of more gas than usual.  °· Spotting of blood in your stool or on the toilet paper.  °IF YOU HAD POLYPS REMOVED DURING THE COLONOSCOPY: °· No aspirin products for 7 days or as instructed.  °· No alcohol for 7 days or as instructed.  °· Eat a soft diet for the next 24 hours.  ° °FINDING OUT THE RESULTS OF YOUR TEST ° °Not all test results are available during your  visit. If your test results are not back during the visit, make an appointment with your caregiver to find out the results. Do not assume everything is normal if you have not heard from your caregiver or the medical facility. It is important for you to follow up on all of your test results.  ° ° ° °SEEK IMMEDIATE MEDICAL CARE IF: ° °· You have more than a spotting of blood in your stool.  °· Your belly is swollen (abdominal distention).  °· You are nauseated or vomiting.  °· You have a fever.  °· You have abdominal pain or discomfort that is severe or gets worse throughout the day.  ° ° °Document Released: 01/01/2004 Document Revised: 01/29/2011 Document Reviewed: 12/30/2007 °ExitCare® Patient Information ©2012 [employer]. ° °

## 2017-11-18 NOTE — H&P (Signed)
Patient interval history reviewed.  Patient examined again.  There has been no change from documented H/P dated 11/12/17 (scanned into chart from our office) except as documented above.  Assessment:  1.  Positive cologuard test  Plan:  1.  Colonoscopy. 2.  Risks (bleeding, infection, bowel perforation that could require surgery, sedation-related changes in cardiopulmonary systems), benefits (identification and possible treatment of source of symptoms, exclusion of certain causes of symptoms), and alternatives (watchful waiting, radiographic imaging studies, empiric medical treatment) of colonoscopy were explained to patient/family in detail and patient wishes to proceed.

## 2017-11-18 NOTE — Anesthesia Procedure Notes (Signed)
Date/Time: 11/18/2017 11:10 AM Performed by: Glory Buff, CRNA Oxygen Delivery Method: Nasal cannula

## 2017-11-18 NOTE — Op Note (Signed)
[facility] Patient Name: [name] Procedure Date: 11/18/2017 MRN: [id number] Attending MD: Arta Silence , MD Date of Birth: 1956/03/25 CSN: [id number] Age: 62 Admit Type: Outpatient Procedure:                Colonoscopy Indications:              Last colonoscopy: 2011, Positive Cologuard test Providers:                Arta Silence, MD, Kingsley Plan, RN, Kaila Devries Dalton, Technician Referring MD:              Medicines:                Monitored Anesthesia Care Complications:            No immediate complications. Estimated Blood Loss:     Estimated blood loss was minimal. Procedure:                Pre-Anesthesia Assessment:                           - Prior to the procedure, a History and Physical                            was performed, and patient medications and                            allergies were reviewed. The patient's tolerance of                            previous anesthesia was also reviewed. The risks                            and benefits of the procedure and the sedation                            options and risks were discussed with the patient.                            All questions were answered, and informed consent                            was obtained. Prior Anticoagulants: The patient has                            taken aspirin. ASA Grade Assessment: III - A                            patient with severe systemic disease. After                            reviewing the risks and benefits, the patient was  deemed in satisfactory condition to undergo the                            procedure.                           After obtaining informed consent, the colonoscope                            was passed under direct vision. Throughout the                            procedure, the patient's blood pressure, pulse, and                            oxygen saturations were monitored  continuously. The                            EC-[id number] ([id number]) scope was introduced through                            the anus and advanced to the the cecum, identified                            by appendiceal orifice and ileocecal valve. The                            ileocecal valve, appendiceal orifice, and rectum                            were photographed. The entire colon was examined.                            The colonoscopy was performed without difficulty.                            The patient tolerated the procedure well. The                            quality of the bowel preparation was fair. Scope In: 11:21:15 AM Scope Out: 11:39:59 AM Scope Withdrawal Time: 0 hours 15 minutes 33 seconds  Total Procedure Duration: 0 hours 18 minutes 44 seconds  Findings:      Hemorrhoids were found on perianal exam.      A few medium-mouthed diverticula were found in the sigmoid colon and       descending colon.      A diffuse area of moderate melanosis was found in the entire colon.      A 8 mm polyp was found in the transverse colon. The polyp was       semi-pedunculated. The polyp was removed with a hot snare. Resection and       retrieval were complete.      A 5 mm polyp was found in the transverse colon. The polyp was sessile.       The polyp was removed with a cold snare. Resection and  retrieval were       complete.      Fair prep; diminutive or subtle sessile lesions could have easily been       missed. Colon otherwise normal; no other polyps, masses, vascular       ectasias, or inflammatory changes were seen.      The retroflexed view of the distal rectum and anal verge was normal and       showed no anal or rectal abnormalities. Impression:               - Preparation of the colon was fair, limiting some                            views as described above.                           - Hemorrhoids found on perianal exam.                           - Diverticulosis in the  sigmoid colon and in the                            descending colon.                           - Melanosis in the colon.                           - One 8 mm polyp in the transverse colon, removed                            with a hot snare. Resected and retrieved.                           - One 5 mm polyp in the transverse colon, removed                            with a cold snare. Resected and retrieved.                           - The distal rectum and anal verge are normal on                            retroflexion view. Moderate Sedation:      None Recommendation:           - Patient has a contact number available for                            emergencies. The signs and symptoms of potential                            delayed complications were discussed with the                            patient. Return to normal activities tomorrow.  Written discharge instructions were provided to the                            patient.                           - Discharge patient to home (ambulatory).                           - High fiber diet indefinitely.                           - Continue present medications.                           - Await pathology results.                           - Repeat colonoscopy (date not yet determined) for                            surveillance based on pathology results.                           - Return to GI clinic PRN.                           - Return to referring physician as previously                            scheduled. Procedure Code(s):        --- Professional ---                           [phone], Colonoscopy, flexible; with removal of                            tumor(s), polyp(s), or other lesion(s) by snare                            technique Diagnosis Code(s):        --- Professional ---                           K64.9, Unspecified hemorrhoids                           K63.89, Other specified diseases of  intestine                           D12.3, Benign neoplasm of transverse colon (hepatic                            flexure or splenic flexure)                           R19.5, Other fecal abnormalities  K57.30, Diverticulosis of large intestine without                            perforation or abscess without bleeding CPT copyright 2017 [employer]. All rights reserved. The codes documented in this report are preliminary and upon coder review may  be revised to meet current compliance requirements. Arta Silence, MD 11/18/2017 11:44:42 AM This report has been signed electronically. Number of Addenda: 0

## 2017-11-18 NOTE — Progress Notes (Signed)
Bp running low post procedure. Dr. Sharol Roussel by to see pt. Order for NS 250cc bolus then reassess pressure

## 2017-11-18 NOTE — Anesthesia Postprocedure Evaluation (Signed)
Anesthesia Post Note  Patient: [name]  Procedure(s) Performed: COLONOSCOPY WITH PROPOFOL (N/A ) POLYPECTOMY     Patient location during evaluation: PACU Anesthesia Type: MAC Level of consciousness: awake and alert Pain management: pain level controlled Vital Signs Assessment: post-procedure vital signs reviewed and stable Respiratory status: spontaneous breathing, nonlabored ventilation, respiratory function stable and patient connected to nasal cannula oxygen Cardiovascular status: stable and blood pressure returned to baseline Postop Assessment: no apparent nausea or vomiting Anesthetic complications: no    Last Vitals:  Vitals:   11/18/17 1210 11/18/17 1215  BP: (!) 58/40 (!) 87/60  Pulse: (!) 103 (!) 104  Resp: 15 19  Temp:    SpO2: 100% 97%    Last Pain:  Vitals:   11/18/17 1215  TempSrc:   PainSc: 0-No pain                 Chinyere Galiano DAVID

## 2017-11-19 DIAGNOSIS — D689 Coagulation defect, unspecified: Secondary | ICD-10-CM | POA: Diagnosis not present

## 2017-11-19 DIAGNOSIS — Z992 Dependence on renal dialysis: Secondary | ICD-10-CM | POA: Diagnosis not present

## 2017-11-19 DIAGNOSIS — N2581 Secondary hyperparathyroidism of renal origin: Secondary | ICD-10-CM | POA: Diagnosis not present

## 2017-11-19 DIAGNOSIS — E1122 Type 2 diabetes mellitus with diabetic chronic kidney disease: Secondary | ICD-10-CM | POA: Diagnosis not present

## 2017-11-19 DIAGNOSIS — N186 End stage renal disease: Secondary | ICD-10-CM | POA: Diagnosis not present

## 2017-11-19 DIAGNOSIS — D631 Anemia in chronic kidney disease: Secondary | ICD-10-CM | POA: Diagnosis not present

## 2017-11-20 ENCOUNTER — Encounter (HOSPITAL_COMMUNITY): Payer: Self-pay | Admitting: Gastroenterology

## 2017-11-21 DIAGNOSIS — E1122 Type 2 diabetes mellitus with diabetic chronic kidney disease: Secondary | ICD-10-CM | POA: Diagnosis not present

## 2017-11-21 DIAGNOSIS — D631 Anemia in chronic kidney disease: Secondary | ICD-10-CM | POA: Diagnosis not present

## 2017-11-21 DIAGNOSIS — N186 End stage renal disease: Secondary | ICD-10-CM | POA: Diagnosis not present

## 2017-11-21 DIAGNOSIS — N2581 Secondary hyperparathyroidism of renal origin: Secondary | ICD-10-CM | POA: Diagnosis not present

## 2017-11-21 DIAGNOSIS — Z992 Dependence on renal dialysis: Secondary | ICD-10-CM | POA: Diagnosis not present

## 2017-11-21 DIAGNOSIS — D689 Coagulation defect, unspecified: Secondary | ICD-10-CM | POA: Diagnosis not present

## 2017-11-24 DIAGNOSIS — D631 Anemia in chronic kidney disease: Secondary | ICD-10-CM | POA: Diagnosis not present

## 2017-11-24 DIAGNOSIS — Z992 Dependence on renal dialysis: Secondary | ICD-10-CM | POA: Diagnosis not present

## 2017-11-24 DIAGNOSIS — N2581 Secondary hyperparathyroidism of renal origin: Secondary | ICD-10-CM | POA: Diagnosis not present

## 2017-11-24 DIAGNOSIS — N186 End stage renal disease: Secondary | ICD-10-CM | POA: Diagnosis not present

## 2017-11-24 DIAGNOSIS — D689 Coagulation defect, unspecified: Secondary | ICD-10-CM | POA: Diagnosis not present

## 2017-11-24 DIAGNOSIS — E1122 Type 2 diabetes mellitus with diabetic chronic kidney disease: Secondary | ICD-10-CM | POA: Diagnosis not present

## 2017-11-26 DIAGNOSIS — Z992 Dependence on renal dialysis: Secondary | ICD-10-CM | POA: Diagnosis not present

## 2017-11-26 DIAGNOSIS — E1122 Type 2 diabetes mellitus with diabetic chronic kidney disease: Secondary | ICD-10-CM | POA: Diagnosis not present

## 2017-11-26 DIAGNOSIS — D631 Anemia in chronic kidney disease: Secondary | ICD-10-CM | POA: Diagnosis not present

## 2017-11-26 DIAGNOSIS — N2581 Secondary hyperparathyroidism of renal origin: Secondary | ICD-10-CM | POA: Diagnosis not present

## 2017-11-26 DIAGNOSIS — D689 Coagulation defect, unspecified: Secondary | ICD-10-CM | POA: Diagnosis not present

## 2017-11-26 DIAGNOSIS — N186 End stage renal disease: Secondary | ICD-10-CM | POA: Diagnosis not present

## 2017-11-27 DIAGNOSIS — R6889 Other general symptoms and signs: Secondary | ICD-10-CM | POA: Diagnosis not present

## 2017-11-27 DIAGNOSIS — Z992 Dependence on renal dialysis: Secondary | ICD-10-CM | POA: Diagnosis not present

## 2017-11-27 DIAGNOSIS — I10 Essential (primary) hypertension: Secondary | ICD-10-CM | POA: Diagnosis not present

## 2017-11-27 DIAGNOSIS — R05 Cough: Secondary | ICD-10-CM | POA: Diagnosis not present

## 2017-11-27 DIAGNOSIS — E118 Type 2 diabetes mellitus with unspecified complications: Secondary | ICD-10-CM | POA: Diagnosis not present

## 2017-11-28 DIAGNOSIS — N186 End stage renal disease: Secondary | ICD-10-CM | POA: Diagnosis not present

## 2017-11-28 DIAGNOSIS — D631 Anemia in chronic kidney disease: Secondary | ICD-10-CM | POA: Diagnosis not present

## 2017-11-28 DIAGNOSIS — N2581 Secondary hyperparathyroidism of renal origin: Secondary | ICD-10-CM | POA: Diagnosis not present

## 2017-11-28 DIAGNOSIS — D689 Coagulation defect, unspecified: Secondary | ICD-10-CM | POA: Diagnosis not present

## 2017-11-28 DIAGNOSIS — Z992 Dependence on renal dialysis: Secondary | ICD-10-CM | POA: Diagnosis not present

## 2017-11-28 DIAGNOSIS — E1122 Type 2 diabetes mellitus with diabetic chronic kidney disease: Secondary | ICD-10-CM | POA: Diagnosis not present

## 2017-11-29 DIAGNOSIS — N186 End stage renal disease: Secondary | ICD-10-CM | POA: Diagnosis not present

## 2017-11-29 DIAGNOSIS — Z992 Dependence on renal dialysis: Secondary | ICD-10-CM | POA: Diagnosis not present

## 2017-11-29 DIAGNOSIS — I129 Hypertensive chronic kidney disease with stage 1 through stage 4 chronic kidney disease, or unspecified chronic kidney disease: Secondary | ICD-10-CM | POA: Diagnosis not present

## 2017-12-01 DIAGNOSIS — N2581 Secondary hyperparathyroidism of renal origin: Secondary | ICD-10-CM | POA: Diagnosis not present

## 2017-12-01 DIAGNOSIS — N186 End stage renal disease: Secondary | ICD-10-CM | POA: Diagnosis not present

## 2017-12-01 DIAGNOSIS — D631 Anemia in chronic kidney disease: Secondary | ICD-10-CM | POA: Diagnosis not present

## 2017-12-01 DIAGNOSIS — L299 Pruritus, unspecified: Secondary | ICD-10-CM | POA: Diagnosis not present

## 2017-12-01 DIAGNOSIS — E1122 Type 2 diabetes mellitus with diabetic chronic kidney disease: Secondary | ICD-10-CM | POA: Diagnosis not present

## 2017-12-01 DIAGNOSIS — Z992 Dependence on renal dialysis: Secondary | ICD-10-CM | POA: Diagnosis not present

## 2017-12-01 DIAGNOSIS — D689 Coagulation defect, unspecified: Secondary | ICD-10-CM | POA: Diagnosis not present

## 2017-12-01 DIAGNOSIS — R52 Pain, unspecified: Secondary | ICD-10-CM | POA: Diagnosis not present

## 2017-12-03 DIAGNOSIS — Z992 Dependence on renal dialysis: Secondary | ICD-10-CM | POA: Diagnosis not present

## 2017-12-03 DIAGNOSIS — N186 End stage renal disease: Secondary | ICD-10-CM | POA: Diagnosis not present

## 2017-12-03 DIAGNOSIS — D631 Anemia in chronic kidney disease: Secondary | ICD-10-CM | POA: Diagnosis not present

## 2017-12-03 DIAGNOSIS — R52 Pain, unspecified: Secondary | ICD-10-CM | POA: Diagnosis not present

## 2017-12-03 DIAGNOSIS — E1122 Type 2 diabetes mellitus with diabetic chronic kidney disease: Secondary | ICD-10-CM | POA: Diagnosis not present

## 2017-12-03 DIAGNOSIS — L299 Pruritus, unspecified: Secondary | ICD-10-CM | POA: Diagnosis not present

## 2017-12-03 DIAGNOSIS — D689 Coagulation defect, unspecified: Secondary | ICD-10-CM | POA: Diagnosis not present

## 2017-12-03 DIAGNOSIS — N2581 Secondary hyperparathyroidism of renal origin: Secondary | ICD-10-CM | POA: Diagnosis not present

## 2017-12-05 DIAGNOSIS — R52 Pain, unspecified: Secondary | ICD-10-CM | POA: Diagnosis not present

## 2017-12-05 DIAGNOSIS — N186 End stage renal disease: Secondary | ICD-10-CM | POA: Diagnosis not present

## 2017-12-05 DIAGNOSIS — Z992 Dependence on renal dialysis: Secondary | ICD-10-CM | POA: Diagnosis not present

## 2017-12-05 DIAGNOSIS — D689 Coagulation defect, unspecified: Secondary | ICD-10-CM | POA: Diagnosis not present

## 2017-12-05 DIAGNOSIS — N2581 Secondary hyperparathyroidism of renal origin: Secondary | ICD-10-CM | POA: Diagnosis not present

## 2017-12-05 DIAGNOSIS — L299 Pruritus, unspecified: Secondary | ICD-10-CM | POA: Diagnosis not present

## 2017-12-05 DIAGNOSIS — E1122 Type 2 diabetes mellitus with diabetic chronic kidney disease: Secondary | ICD-10-CM | POA: Diagnosis not present

## 2017-12-05 DIAGNOSIS — D631 Anemia in chronic kidney disease: Secondary | ICD-10-CM | POA: Diagnosis not present

## 2017-12-08 DIAGNOSIS — E1122 Type 2 diabetes mellitus with diabetic chronic kidney disease: Secondary | ICD-10-CM | POA: Diagnosis not present

## 2017-12-08 DIAGNOSIS — L299 Pruritus, unspecified: Secondary | ICD-10-CM | POA: Diagnosis not present

## 2017-12-08 DIAGNOSIS — Z992 Dependence on renal dialysis: Secondary | ICD-10-CM | POA: Diagnosis not present

## 2017-12-08 DIAGNOSIS — N186 End stage renal disease: Secondary | ICD-10-CM | POA: Diagnosis not present

## 2017-12-08 DIAGNOSIS — D631 Anemia in chronic kidney disease: Secondary | ICD-10-CM | POA: Diagnosis not present

## 2017-12-08 DIAGNOSIS — R52 Pain, unspecified: Secondary | ICD-10-CM | POA: Diagnosis not present

## 2017-12-08 DIAGNOSIS — D689 Coagulation defect, unspecified: Secondary | ICD-10-CM | POA: Diagnosis not present

## 2017-12-08 DIAGNOSIS — N2581 Secondary hyperparathyroidism of renal origin: Secondary | ICD-10-CM | POA: Diagnosis not present

## 2017-12-10 DIAGNOSIS — D689 Coagulation defect, unspecified: Secondary | ICD-10-CM | POA: Diagnosis not present

## 2017-12-10 DIAGNOSIS — Z992 Dependence on renal dialysis: Secondary | ICD-10-CM | POA: Diagnosis not present

## 2017-12-10 DIAGNOSIS — D631 Anemia in chronic kidney disease: Secondary | ICD-10-CM | POA: Diagnosis not present

## 2017-12-10 DIAGNOSIS — E1122 Type 2 diabetes mellitus with diabetic chronic kidney disease: Secondary | ICD-10-CM | POA: Diagnosis not present

## 2017-12-10 DIAGNOSIS — L299 Pruritus, unspecified: Secondary | ICD-10-CM | POA: Diagnosis not present

## 2017-12-10 DIAGNOSIS — N2581 Secondary hyperparathyroidism of renal origin: Secondary | ICD-10-CM | POA: Diagnosis not present

## 2017-12-10 DIAGNOSIS — N186 End stage renal disease: Secondary | ICD-10-CM | POA: Diagnosis not present

## 2017-12-10 DIAGNOSIS — R52 Pain, unspecified: Secondary | ICD-10-CM | POA: Diagnosis not present

## 2017-12-12 DIAGNOSIS — N2581 Secondary hyperparathyroidism of renal origin: Secondary | ICD-10-CM | POA: Diagnosis not present

## 2017-12-12 DIAGNOSIS — L299 Pruritus, unspecified: Secondary | ICD-10-CM | POA: Diagnosis not present

## 2017-12-12 DIAGNOSIS — E1122 Type 2 diabetes mellitus with diabetic chronic kidney disease: Secondary | ICD-10-CM | POA: Diagnosis not present

## 2017-12-12 DIAGNOSIS — Z992 Dependence on renal dialysis: Secondary | ICD-10-CM | POA: Diagnosis not present

## 2017-12-12 DIAGNOSIS — R52 Pain, unspecified: Secondary | ICD-10-CM | POA: Diagnosis not present

## 2017-12-12 DIAGNOSIS — D689 Coagulation defect, unspecified: Secondary | ICD-10-CM | POA: Diagnosis not present

## 2017-12-12 DIAGNOSIS — N186 End stage renal disease: Secondary | ICD-10-CM | POA: Diagnosis not present

## 2017-12-12 DIAGNOSIS — D631 Anemia in chronic kidney disease: Secondary | ICD-10-CM | POA: Diagnosis not present

## 2017-12-13 DIAGNOSIS — G4733 Obstructive sleep apnea (adult) (pediatric): Secondary | ICD-10-CM | POA: Diagnosis not present

## 2017-12-15 DIAGNOSIS — D689 Coagulation defect, unspecified: Secondary | ICD-10-CM | POA: Diagnosis not present

## 2017-12-15 DIAGNOSIS — N2581 Secondary hyperparathyroidism of renal origin: Secondary | ICD-10-CM | POA: Diagnosis not present

## 2017-12-15 DIAGNOSIS — Z992 Dependence on renal dialysis: Secondary | ICD-10-CM | POA: Diagnosis not present

## 2017-12-15 DIAGNOSIS — N186 End stage renal disease: Secondary | ICD-10-CM | POA: Diagnosis not present

## 2017-12-15 DIAGNOSIS — E1122 Type 2 diabetes mellitus with diabetic chronic kidney disease: Secondary | ICD-10-CM | POA: Diagnosis not present

## 2017-12-15 DIAGNOSIS — D631 Anemia in chronic kidney disease: Secondary | ICD-10-CM | POA: Diagnosis not present

## 2017-12-15 DIAGNOSIS — L299 Pruritus, unspecified: Secondary | ICD-10-CM | POA: Diagnosis not present

## 2017-12-15 DIAGNOSIS — R52 Pain, unspecified: Secondary | ICD-10-CM | POA: Diagnosis not present

## 2017-12-16 ENCOUNTER — Ambulatory Visit
Admission: RE | Admit: 2017-12-16 | Discharge: 2017-12-16 | Disposition: A | Discharge status: Home / Self Care | Payer: Medicare Other | Source: Ambulatory Visit | Attending: Internal Medicine | Admitting: Internal Medicine

## 2017-12-16 DIAGNOSIS — Z1231 Encounter for screening mammogram for malignant neoplasm of breast: Secondary | ICD-10-CM | POA: Diagnosis not present

## 2017-12-17 DIAGNOSIS — L299 Pruritus, unspecified: Secondary | ICD-10-CM | POA: Diagnosis not present

## 2017-12-17 DIAGNOSIS — N2581 Secondary hyperparathyroidism of renal origin: Secondary | ICD-10-CM | POA: Diagnosis not present

## 2017-12-17 DIAGNOSIS — N186 End stage renal disease: Secondary | ICD-10-CM | POA: Diagnosis not present

## 2017-12-17 DIAGNOSIS — D689 Coagulation defect, unspecified: Secondary | ICD-10-CM | POA: Diagnosis not present

## 2017-12-17 DIAGNOSIS — D631 Anemia in chronic kidney disease: Secondary | ICD-10-CM | POA: Diagnosis not present

## 2017-12-17 DIAGNOSIS — Z992 Dependence on renal dialysis: Secondary | ICD-10-CM | POA: Diagnosis not present

## 2017-12-17 DIAGNOSIS — R52 Pain, unspecified: Secondary | ICD-10-CM | POA: Diagnosis not present

## 2017-12-17 DIAGNOSIS — E1122 Type 2 diabetes mellitus with diabetic chronic kidney disease: Secondary | ICD-10-CM | POA: Diagnosis not present

## 2017-12-19 DIAGNOSIS — E1122 Type 2 diabetes mellitus with diabetic chronic kidney disease: Secondary | ICD-10-CM | POA: Diagnosis not present

## 2017-12-19 DIAGNOSIS — D689 Coagulation defect, unspecified: Secondary | ICD-10-CM | POA: Diagnosis not present

## 2017-12-19 DIAGNOSIS — N2581 Secondary hyperparathyroidism of renal origin: Secondary | ICD-10-CM | POA: Diagnosis not present

## 2017-12-19 DIAGNOSIS — R52 Pain, unspecified: Secondary | ICD-10-CM | POA: Diagnosis not present

## 2017-12-19 DIAGNOSIS — N186 End stage renal disease: Secondary | ICD-10-CM | POA: Diagnosis not present

## 2017-12-19 DIAGNOSIS — L299 Pruritus, unspecified: Secondary | ICD-10-CM | POA: Diagnosis not present

## 2017-12-19 DIAGNOSIS — D631 Anemia in chronic kidney disease: Secondary | ICD-10-CM | POA: Diagnosis not present

## 2017-12-19 DIAGNOSIS — Z992 Dependence on renal dialysis: Secondary | ICD-10-CM | POA: Diagnosis not present

## 2017-12-22 DIAGNOSIS — R52 Pain, unspecified: Secondary | ICD-10-CM | POA: Diagnosis not present

## 2017-12-22 DIAGNOSIS — Z992 Dependence on renal dialysis: Secondary | ICD-10-CM | POA: Diagnosis not present

## 2017-12-22 DIAGNOSIS — D689 Coagulation defect, unspecified: Secondary | ICD-10-CM | POA: Diagnosis not present

## 2017-12-22 DIAGNOSIS — D631 Anemia in chronic kidney disease: Secondary | ICD-10-CM | POA: Diagnosis not present

## 2017-12-22 DIAGNOSIS — E1122 Type 2 diabetes mellitus with diabetic chronic kidney disease: Secondary | ICD-10-CM | POA: Diagnosis not present

## 2017-12-22 DIAGNOSIS — L299 Pruritus, unspecified: Secondary | ICD-10-CM | POA: Diagnosis not present

## 2017-12-22 DIAGNOSIS — N2581 Secondary hyperparathyroidism of renal origin: Secondary | ICD-10-CM | POA: Diagnosis not present

## 2017-12-22 DIAGNOSIS — N186 End stage renal disease: Secondary | ICD-10-CM | POA: Diagnosis not present

## 2017-12-23 DIAGNOSIS — D689 Coagulation defect, unspecified: Secondary | ICD-10-CM | POA: Diagnosis not present

## 2017-12-23 DIAGNOSIS — Z992 Dependence on renal dialysis: Secondary | ICD-10-CM | POA: Diagnosis not present

## 2017-12-23 DIAGNOSIS — N2581 Secondary hyperparathyroidism of renal origin: Secondary | ICD-10-CM | POA: Diagnosis not present

## 2017-12-23 DIAGNOSIS — E1122 Type 2 diabetes mellitus with diabetic chronic kidney disease: Secondary | ICD-10-CM | POA: Diagnosis not present

## 2017-12-23 DIAGNOSIS — N186 End stage renal disease: Secondary | ICD-10-CM | POA: Diagnosis not present

## 2017-12-23 DIAGNOSIS — R52 Pain, unspecified: Secondary | ICD-10-CM | POA: Diagnosis not present

## 2017-12-23 DIAGNOSIS — D631 Anemia in chronic kidney disease: Secondary | ICD-10-CM | POA: Diagnosis not present

## 2017-12-23 DIAGNOSIS — L299 Pruritus, unspecified: Secondary | ICD-10-CM | POA: Diagnosis not present

## 2017-12-24 DIAGNOSIS — Z992 Dependence on renal dialysis: Secondary | ICD-10-CM | POA: Diagnosis not present

## 2017-12-24 DIAGNOSIS — E1122 Type 2 diabetes mellitus with diabetic chronic kidney disease: Secondary | ICD-10-CM | POA: Diagnosis not present

## 2017-12-24 DIAGNOSIS — L299 Pruritus, unspecified: Secondary | ICD-10-CM | POA: Diagnosis not present

## 2017-12-24 DIAGNOSIS — D689 Coagulation defect, unspecified: Secondary | ICD-10-CM | POA: Diagnosis not present

## 2017-12-24 DIAGNOSIS — D631 Anemia in chronic kidney disease: Secondary | ICD-10-CM | POA: Diagnosis not present

## 2017-12-24 DIAGNOSIS — R52 Pain, unspecified: Secondary | ICD-10-CM | POA: Diagnosis not present

## 2017-12-24 DIAGNOSIS — N186 End stage renal disease: Secondary | ICD-10-CM | POA: Diagnosis not present

## 2017-12-24 DIAGNOSIS — N2581 Secondary hyperparathyroidism of renal origin: Secondary | ICD-10-CM | POA: Diagnosis not present

## 2017-12-26 DIAGNOSIS — Z992 Dependence on renal dialysis: Secondary | ICD-10-CM | POA: Diagnosis not present

## 2017-12-26 DIAGNOSIS — D631 Anemia in chronic kidney disease: Secondary | ICD-10-CM | POA: Diagnosis not present

## 2017-12-26 DIAGNOSIS — L299 Pruritus, unspecified: Secondary | ICD-10-CM | POA: Diagnosis not present

## 2017-12-26 DIAGNOSIS — N2581 Secondary hyperparathyroidism of renal origin: Secondary | ICD-10-CM | POA: Diagnosis not present

## 2017-12-26 DIAGNOSIS — D689 Coagulation defect, unspecified: Secondary | ICD-10-CM | POA: Diagnosis not present

## 2017-12-26 DIAGNOSIS — E1122 Type 2 diabetes mellitus with diabetic chronic kidney disease: Secondary | ICD-10-CM | POA: Diagnosis not present

## 2017-12-26 DIAGNOSIS — R52 Pain, unspecified: Secondary | ICD-10-CM | POA: Diagnosis not present

## 2017-12-26 DIAGNOSIS — N186 End stage renal disease: Secondary | ICD-10-CM | POA: Diagnosis not present

## 2017-12-29 DIAGNOSIS — N186 End stage renal disease: Secondary | ICD-10-CM | POA: Diagnosis not present

## 2017-12-29 DIAGNOSIS — Z992 Dependence on renal dialysis: Secondary | ICD-10-CM | POA: Diagnosis not present

## 2017-12-29 DIAGNOSIS — D631 Anemia in chronic kidney disease: Secondary | ICD-10-CM | POA: Diagnosis not present

## 2017-12-29 DIAGNOSIS — E1122 Type 2 diabetes mellitus with diabetic chronic kidney disease: Secondary | ICD-10-CM | POA: Diagnosis not present

## 2017-12-29 DIAGNOSIS — D689 Coagulation defect, unspecified: Secondary | ICD-10-CM | POA: Diagnosis not present

## 2017-12-29 DIAGNOSIS — L299 Pruritus, unspecified: Secondary | ICD-10-CM | POA: Diagnosis not present

## 2017-12-29 DIAGNOSIS — R52 Pain, unspecified: Secondary | ICD-10-CM | POA: Diagnosis not present

## 2017-12-29 DIAGNOSIS — N2581 Secondary hyperparathyroidism of renal origin: Secondary | ICD-10-CM | POA: Diagnosis not present

## 2017-12-30 DIAGNOSIS — N186 End stage renal disease: Secondary | ICD-10-CM | POA: Diagnosis not present

## 2017-12-30 DIAGNOSIS — I129 Hypertensive chronic kidney disease with stage 1 through stage 4 chronic kidney disease, or unspecified chronic kidney disease: Secondary | ICD-10-CM | POA: Diagnosis not present

## 2017-12-30 DIAGNOSIS — Z992 Dependence on renal dialysis: Secondary | ICD-10-CM | POA: Diagnosis not present

## 2017-12-31 DIAGNOSIS — N2581 Secondary hyperparathyroidism of renal origin: Secondary | ICD-10-CM | POA: Diagnosis not present

## 2017-12-31 DIAGNOSIS — D689 Coagulation defect, unspecified: Secondary | ICD-10-CM | POA: Diagnosis not present

## 2017-12-31 DIAGNOSIS — D631 Anemia in chronic kidney disease: Secondary | ICD-10-CM | POA: Diagnosis not present

## 2017-12-31 DIAGNOSIS — Z992 Dependence on renal dialysis: Secondary | ICD-10-CM | POA: Diagnosis not present

## 2017-12-31 DIAGNOSIS — E1122 Type 2 diabetes mellitus with diabetic chronic kidney disease: Secondary | ICD-10-CM | POA: Diagnosis not present

## 2017-12-31 DIAGNOSIS — N186 End stage renal disease: Secondary | ICD-10-CM | POA: Diagnosis not present

## 2018-01-02 DIAGNOSIS — D631 Anemia in chronic kidney disease: Secondary | ICD-10-CM | POA: Diagnosis not present

## 2018-01-02 DIAGNOSIS — N186 End stage renal disease: Secondary | ICD-10-CM | POA: Diagnosis not present

## 2018-01-02 DIAGNOSIS — E1122 Type 2 diabetes mellitus with diabetic chronic kidney disease: Secondary | ICD-10-CM | POA: Diagnosis not present

## 2018-01-02 DIAGNOSIS — N2581 Secondary hyperparathyroidism of renal origin: Secondary | ICD-10-CM | POA: Diagnosis not present

## 2018-01-02 DIAGNOSIS — D689 Coagulation defect, unspecified: Secondary | ICD-10-CM | POA: Diagnosis not present

## 2018-01-02 DIAGNOSIS — Z992 Dependence on renal dialysis: Secondary | ICD-10-CM | POA: Diagnosis not present

## 2018-01-05 DIAGNOSIS — N2581 Secondary hyperparathyroidism of renal origin: Secondary | ICD-10-CM | POA: Diagnosis not present

## 2018-01-05 DIAGNOSIS — Z992 Dependence on renal dialysis: Secondary | ICD-10-CM | POA: Diagnosis not present

## 2018-01-05 DIAGNOSIS — D689 Coagulation defect, unspecified: Secondary | ICD-10-CM | POA: Diagnosis not present

## 2018-01-05 DIAGNOSIS — N186 End stage renal disease: Secondary | ICD-10-CM | POA: Diagnosis not present

## 2018-01-05 DIAGNOSIS — D631 Anemia in chronic kidney disease: Secondary | ICD-10-CM | POA: Diagnosis not present

## 2018-01-05 DIAGNOSIS — E1122 Type 2 diabetes mellitus with diabetic chronic kidney disease: Secondary | ICD-10-CM | POA: Diagnosis not present

## 2018-01-07 DIAGNOSIS — E1122 Type 2 diabetes mellitus with diabetic chronic kidney disease: Secondary | ICD-10-CM | POA: Diagnosis not present

## 2018-01-07 DIAGNOSIS — D631 Anemia in chronic kidney disease: Secondary | ICD-10-CM | POA: Diagnosis not present

## 2018-01-07 DIAGNOSIS — N2581 Secondary hyperparathyroidism of renal origin: Secondary | ICD-10-CM | POA: Diagnosis not present

## 2018-01-07 DIAGNOSIS — N186 End stage renal disease: Secondary | ICD-10-CM | POA: Diagnosis not present

## 2018-01-07 DIAGNOSIS — D689 Coagulation defect, unspecified: Secondary | ICD-10-CM | POA: Diagnosis not present

## 2018-01-07 DIAGNOSIS — Z992 Dependence on renal dialysis: Secondary | ICD-10-CM | POA: Diagnosis not present

## 2018-01-07 IMAGING — DX DG CHEST 1V PORT
1 series · 1 of 1 positions shown · non-contrast
Comparison: 09/28/2015

CLINICAL DATA: Chest pain, shortness of breath, and cough for 1
week, worse tonight.

EXAM:
PORTABLE CHEST 1 VIEW

[chest ap]
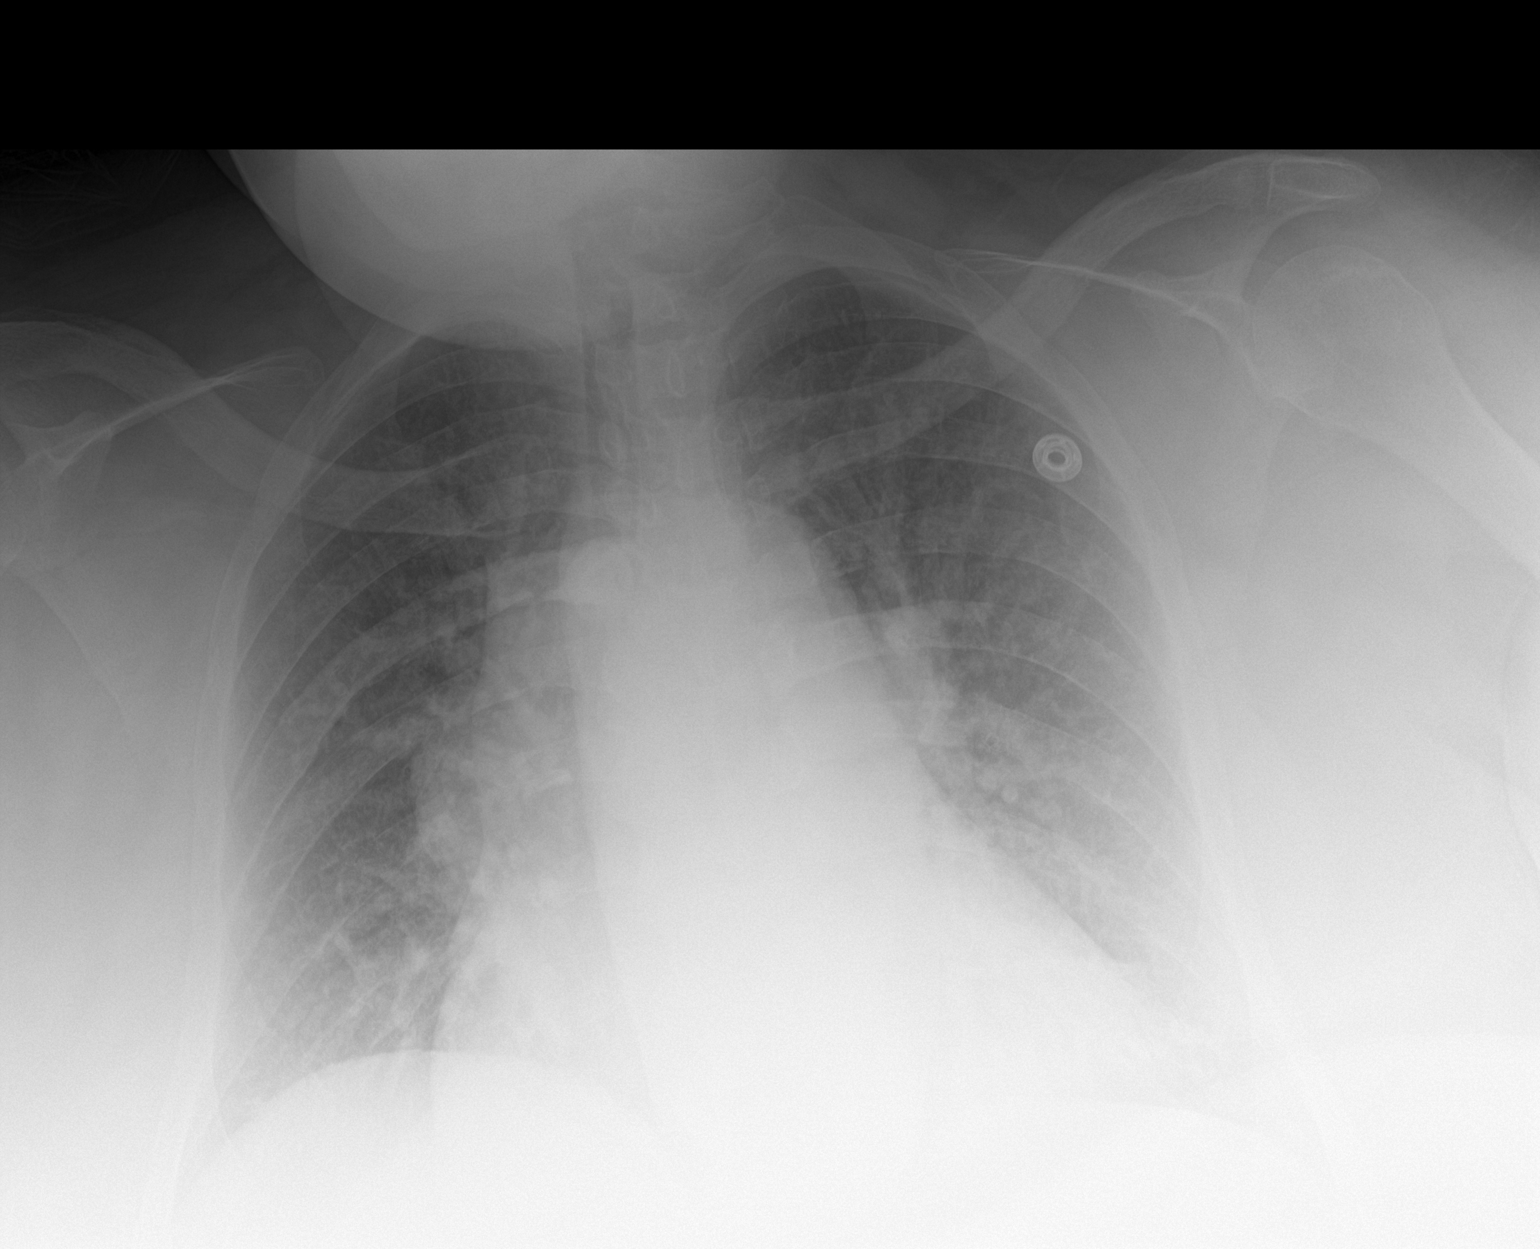

[1 of 1 positions shown; findings below may reference images not displayed]

FINDINGS: Shallow inspiration. Cardiac enlargement with pulmonary vascular
congestion. Mild interstitial pattern suggesting early interstitial
edema. Similar appearance to previous study. No blunting of
costophrenic angles. No pneumothorax. Mediastinal contours appear
intact.
IMPRESSION: Cardiac enlargement with pulmonary vascular congestion and mild
interstitial edema similar to prior study.

## 2018-01-08 IMAGING — DX DG CHEST 1V PORT
1 series · 1 of 1 positions shown · non-contrast
Comparison: 10/04/2015

CLINICAL DATA: Central line placement.

EXAM:
PORTABLE CHEST 1 VIEW

[chest ap]
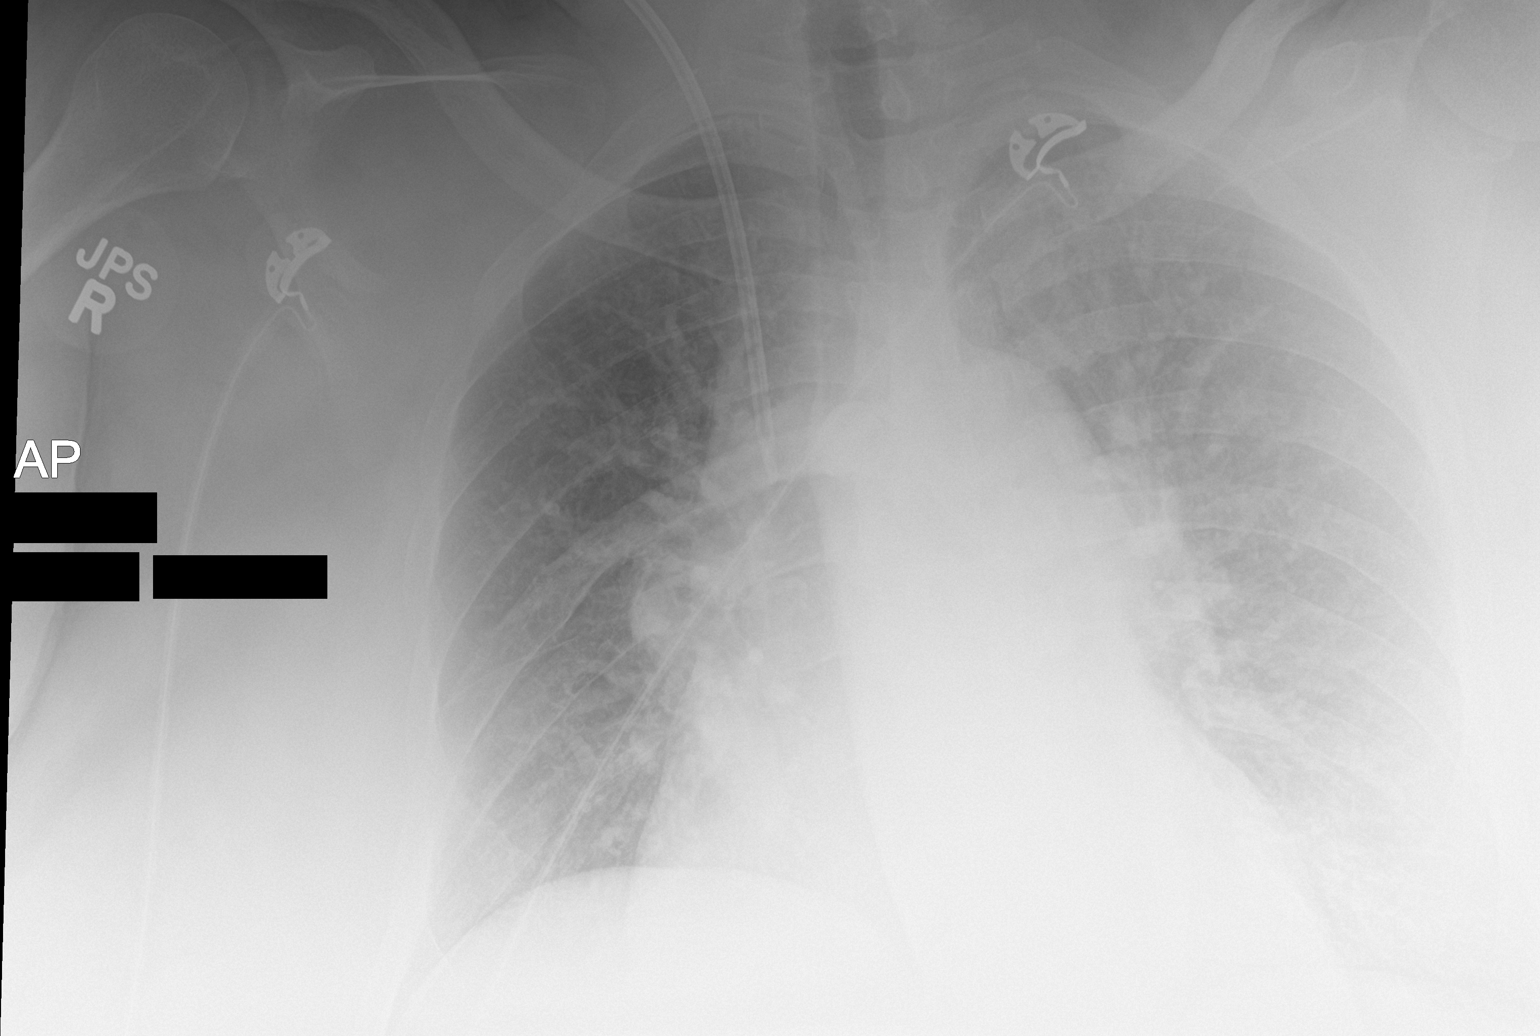

[1 of 1 positions shown; findings below may reference images not displayed]

FINDINGS: Right central line placement with the tip in the SVC. No
pneumothorax. Heart is mildly enlarged. Mild vascular congestion.
Mild interstitial prominence similar prior study. No acute bony
abnormality.
IMPRESSION: Right central line tip in the SVC. No pneumothorax. Otherwise no
real change.

## 2018-01-09 DIAGNOSIS — D689 Coagulation defect, unspecified: Secondary | ICD-10-CM | POA: Diagnosis not present

## 2018-01-09 DIAGNOSIS — N186 End stage renal disease: Secondary | ICD-10-CM | POA: Diagnosis not present

## 2018-01-09 DIAGNOSIS — E1122 Type 2 diabetes mellitus with diabetic chronic kidney disease: Secondary | ICD-10-CM | POA: Diagnosis not present

## 2018-01-09 DIAGNOSIS — N2581 Secondary hyperparathyroidism of renal origin: Secondary | ICD-10-CM | POA: Diagnosis not present

## 2018-01-09 DIAGNOSIS — Z992 Dependence on renal dialysis: Secondary | ICD-10-CM | POA: Diagnosis not present

## 2018-01-09 DIAGNOSIS — D631 Anemia in chronic kidney disease: Secondary | ICD-10-CM | POA: Diagnosis not present

## 2018-01-12 DIAGNOSIS — Z992 Dependence on renal dialysis: Secondary | ICD-10-CM | POA: Diagnosis not present

## 2018-01-12 DIAGNOSIS — E1122 Type 2 diabetes mellitus with diabetic chronic kidney disease: Secondary | ICD-10-CM | POA: Diagnosis not present

## 2018-01-12 DIAGNOSIS — D689 Coagulation defect, unspecified: Secondary | ICD-10-CM | POA: Diagnosis not present

## 2018-01-12 DIAGNOSIS — N186 End stage renal disease: Secondary | ICD-10-CM | POA: Diagnosis not present

## 2018-01-12 DIAGNOSIS — D631 Anemia in chronic kidney disease: Secondary | ICD-10-CM | POA: Diagnosis not present

## 2018-01-12 DIAGNOSIS — N2581 Secondary hyperparathyroidism of renal origin: Secondary | ICD-10-CM | POA: Diagnosis not present

## 2018-01-13 DIAGNOSIS — G4733 Obstructive sleep apnea (adult) (pediatric): Secondary | ICD-10-CM | POA: Diagnosis not present

## 2018-01-14 DIAGNOSIS — E1122 Type 2 diabetes mellitus with diabetic chronic kidney disease: Secondary | ICD-10-CM | POA: Diagnosis not present

## 2018-01-14 DIAGNOSIS — Z992 Dependence on renal dialysis: Secondary | ICD-10-CM | POA: Diagnosis not present

## 2018-01-14 DIAGNOSIS — D631 Anemia in chronic kidney disease: Secondary | ICD-10-CM | POA: Diagnosis not present

## 2018-01-14 DIAGNOSIS — D689 Coagulation defect, unspecified: Secondary | ICD-10-CM | POA: Diagnosis not present

## 2018-01-14 DIAGNOSIS — N186 End stage renal disease: Secondary | ICD-10-CM | POA: Diagnosis not present

## 2018-01-14 DIAGNOSIS — N2581 Secondary hyperparathyroidism of renal origin: Secondary | ICD-10-CM | POA: Diagnosis not present

## 2018-01-16 DIAGNOSIS — Z992 Dependence on renal dialysis: Secondary | ICD-10-CM | POA: Diagnosis not present

## 2018-01-16 DIAGNOSIS — N2581 Secondary hyperparathyroidism of renal origin: Secondary | ICD-10-CM | POA: Diagnosis not present

## 2018-01-16 DIAGNOSIS — N186 End stage renal disease: Secondary | ICD-10-CM | POA: Diagnosis not present

## 2018-01-16 DIAGNOSIS — D689 Coagulation defect, unspecified: Secondary | ICD-10-CM | POA: Diagnosis not present

## 2018-01-16 DIAGNOSIS — E1122 Type 2 diabetes mellitus with diabetic chronic kidney disease: Secondary | ICD-10-CM | POA: Diagnosis not present

## 2018-01-16 DIAGNOSIS — D631 Anemia in chronic kidney disease: Secondary | ICD-10-CM | POA: Diagnosis not present

## 2018-01-19 DIAGNOSIS — Z992 Dependence on renal dialysis: Secondary | ICD-10-CM | POA: Diagnosis not present

## 2018-01-19 DIAGNOSIS — N2581 Secondary hyperparathyroidism of renal origin: Secondary | ICD-10-CM | POA: Diagnosis not present

## 2018-01-19 DIAGNOSIS — N186 End stage renal disease: Secondary | ICD-10-CM | POA: Diagnosis not present

## 2018-01-19 DIAGNOSIS — D631 Anemia in chronic kidney disease: Secondary | ICD-10-CM | POA: Diagnosis not present

## 2018-01-19 DIAGNOSIS — D689 Coagulation defect, unspecified: Secondary | ICD-10-CM | POA: Diagnosis not present

## 2018-01-19 DIAGNOSIS — E1122 Type 2 diabetes mellitus with diabetic chronic kidney disease: Secondary | ICD-10-CM | POA: Diagnosis not present

## 2018-01-21 DIAGNOSIS — D689 Coagulation defect, unspecified: Secondary | ICD-10-CM | POA: Diagnosis not present

## 2018-01-21 DIAGNOSIS — N186 End stage renal disease: Secondary | ICD-10-CM | POA: Diagnosis not present

## 2018-01-21 DIAGNOSIS — Z992 Dependence on renal dialysis: Secondary | ICD-10-CM | POA: Diagnosis not present

## 2018-01-21 DIAGNOSIS — D631 Anemia in chronic kidney disease: Secondary | ICD-10-CM | POA: Diagnosis not present

## 2018-01-21 DIAGNOSIS — N2581 Secondary hyperparathyroidism of renal origin: Secondary | ICD-10-CM | POA: Diagnosis not present

## 2018-01-21 DIAGNOSIS — E1122 Type 2 diabetes mellitus with diabetic chronic kidney disease: Secondary | ICD-10-CM | POA: Diagnosis not present

## 2018-01-22 DIAGNOSIS — R6889 Other general symptoms and signs: Secondary | ICD-10-CM | POA: Diagnosis not present

## 2018-01-22 DIAGNOSIS — E118 Type 2 diabetes mellitus with unspecified complications: Secondary | ICD-10-CM | POA: Diagnosis not present

## 2018-01-23 DIAGNOSIS — D689 Coagulation defect, unspecified: Secondary | ICD-10-CM | POA: Diagnosis not present

## 2018-01-23 DIAGNOSIS — N2581 Secondary hyperparathyroidism of renal origin: Secondary | ICD-10-CM | POA: Diagnosis not present

## 2018-01-23 DIAGNOSIS — Z992 Dependence on renal dialysis: Secondary | ICD-10-CM | POA: Diagnosis not present

## 2018-01-23 DIAGNOSIS — E1122 Type 2 diabetes mellitus with diabetic chronic kidney disease: Secondary | ICD-10-CM | POA: Diagnosis not present

## 2018-01-23 DIAGNOSIS — D631 Anemia in chronic kidney disease: Secondary | ICD-10-CM | POA: Diagnosis not present

## 2018-01-23 DIAGNOSIS — N186 End stage renal disease: Secondary | ICD-10-CM | POA: Diagnosis not present

## 2018-01-25 DIAGNOSIS — Z86010 Personal history of colonic polyps: Secondary | ICD-10-CM | POA: Diagnosis not present

## 2018-01-25 DIAGNOSIS — K8020 Calculus of gallbladder without cholecystitis without obstruction: Secondary | ICD-10-CM | POA: Diagnosis not present

## 2018-01-25 DIAGNOSIS — R748 Abnormal levels of other serum enzymes: Secondary | ICD-10-CM | POA: Diagnosis not present

## 2018-01-26 DIAGNOSIS — D689 Coagulation defect, unspecified: Secondary | ICD-10-CM | POA: Diagnosis not present

## 2018-01-26 DIAGNOSIS — E1122 Type 2 diabetes mellitus with diabetic chronic kidney disease: Secondary | ICD-10-CM | POA: Diagnosis not present

## 2018-01-26 DIAGNOSIS — D631 Anemia in chronic kidney disease: Secondary | ICD-10-CM | POA: Diagnosis not present

## 2018-01-26 DIAGNOSIS — Z992 Dependence on renal dialysis: Secondary | ICD-10-CM | POA: Diagnosis not present

## 2018-01-26 DIAGNOSIS — N186 End stage renal disease: Secondary | ICD-10-CM | POA: Diagnosis not present

## 2018-01-26 DIAGNOSIS — N2581 Secondary hyperparathyroidism of renal origin: Secondary | ICD-10-CM | POA: Diagnosis not present

## 2018-01-27 DIAGNOSIS — E1122 Type 2 diabetes mellitus with diabetic chronic kidney disease: Secondary | ICD-10-CM | POA: Diagnosis not present

## 2018-01-27 DIAGNOSIS — N2581 Secondary hyperparathyroidism of renal origin: Secondary | ICD-10-CM | POA: Diagnosis not present

## 2018-01-27 DIAGNOSIS — N186 End stage renal disease: Secondary | ICD-10-CM | POA: Diagnosis not present

## 2018-01-27 DIAGNOSIS — D689 Coagulation defect, unspecified: Secondary | ICD-10-CM | POA: Diagnosis not present

## 2018-01-27 DIAGNOSIS — Z992 Dependence on renal dialysis: Secondary | ICD-10-CM | POA: Diagnosis not present

## 2018-01-27 DIAGNOSIS — D631 Anemia in chronic kidney disease: Secondary | ICD-10-CM | POA: Diagnosis not present

## 2018-01-28 DIAGNOSIS — D631 Anemia in chronic kidney disease: Secondary | ICD-10-CM | POA: Diagnosis not present

## 2018-01-28 DIAGNOSIS — Z992 Dependence on renal dialysis: Secondary | ICD-10-CM | POA: Diagnosis not present

## 2018-01-28 DIAGNOSIS — N2581 Secondary hyperparathyroidism of renal origin: Secondary | ICD-10-CM | POA: Diagnosis not present

## 2018-01-28 DIAGNOSIS — D689 Coagulation defect, unspecified: Secondary | ICD-10-CM | POA: Diagnosis not present

## 2018-01-28 DIAGNOSIS — N186 End stage renal disease: Secondary | ICD-10-CM | POA: Diagnosis not present

## 2018-01-28 DIAGNOSIS — E1122 Type 2 diabetes mellitus with diabetic chronic kidney disease: Secondary | ICD-10-CM | POA: Diagnosis not present

## 2018-01-29 DIAGNOSIS — E118 Type 2 diabetes mellitus with unspecified complications: Secondary | ICD-10-CM | POA: Diagnosis not present

## 2018-01-29 DIAGNOSIS — I10 Essential (primary) hypertension: Secondary | ICD-10-CM | POA: Diagnosis not present

## 2018-01-29 DIAGNOSIS — R6889 Other general symptoms and signs: Secondary | ICD-10-CM | POA: Diagnosis not present

## 2018-01-30 DIAGNOSIS — N2581 Secondary hyperparathyroidism of renal origin: Secondary | ICD-10-CM | POA: Diagnosis not present

## 2018-01-30 DIAGNOSIS — E1122 Type 2 diabetes mellitus with diabetic chronic kidney disease: Secondary | ICD-10-CM | POA: Diagnosis not present

## 2018-01-30 DIAGNOSIS — D631 Anemia in chronic kidney disease: Secondary | ICD-10-CM | POA: Diagnosis not present

## 2018-01-30 DIAGNOSIS — D689 Coagulation defect, unspecified: Secondary | ICD-10-CM | POA: Diagnosis not present

## 2018-01-30 DIAGNOSIS — N186 End stage renal disease: Secondary | ICD-10-CM | POA: Diagnosis not present

## 2018-01-30 DIAGNOSIS — I129 Hypertensive chronic kidney disease with stage 1 through stage 4 chronic kidney disease, or unspecified chronic kidney disease: Secondary | ICD-10-CM | POA: Diagnosis not present

## 2018-01-30 DIAGNOSIS — Z992 Dependence on renal dialysis: Secondary | ICD-10-CM | POA: Diagnosis not present

## 2018-02-02 DIAGNOSIS — D631 Anemia in chronic kidney disease: Secondary | ICD-10-CM | POA: Diagnosis not present

## 2018-02-02 DIAGNOSIS — D689 Coagulation defect, unspecified: Secondary | ICD-10-CM | POA: Diagnosis not present

## 2018-02-02 DIAGNOSIS — D509 Iron deficiency anemia, unspecified: Secondary | ICD-10-CM | POA: Diagnosis not present

## 2018-02-02 DIAGNOSIS — N2581 Secondary hyperparathyroidism of renal origin: Secondary | ICD-10-CM | POA: Diagnosis not present

## 2018-02-02 DIAGNOSIS — N186 End stage renal disease: Secondary | ICD-10-CM | POA: Diagnosis not present

## 2018-02-02 DIAGNOSIS — Z23 Encounter for immunization: Secondary | ICD-10-CM | POA: Diagnosis not present

## 2018-02-04 DIAGNOSIS — D631 Anemia in chronic kidney disease: Secondary | ICD-10-CM | POA: Diagnosis not present

## 2018-02-04 DIAGNOSIS — N186 End stage renal disease: Secondary | ICD-10-CM | POA: Diagnosis not present

## 2018-02-04 DIAGNOSIS — Z23 Encounter for immunization: Secondary | ICD-10-CM | POA: Diagnosis not present

## 2018-02-04 DIAGNOSIS — D509 Iron deficiency anemia, unspecified: Secondary | ICD-10-CM | POA: Diagnosis not present

## 2018-02-04 DIAGNOSIS — N2581 Secondary hyperparathyroidism of renal origin: Secondary | ICD-10-CM | POA: Diagnosis not present

## 2018-02-04 DIAGNOSIS — D689 Coagulation defect, unspecified: Secondary | ICD-10-CM | POA: Diagnosis not present

## 2018-02-06 DIAGNOSIS — D631 Anemia in chronic kidney disease: Secondary | ICD-10-CM | POA: Diagnosis not present

## 2018-02-06 DIAGNOSIS — N186 End stage renal disease: Secondary | ICD-10-CM | POA: Diagnosis not present

## 2018-02-06 DIAGNOSIS — N2581 Secondary hyperparathyroidism of renal origin: Secondary | ICD-10-CM | POA: Diagnosis not present

## 2018-02-06 DIAGNOSIS — D509 Iron deficiency anemia, unspecified: Secondary | ICD-10-CM | POA: Diagnosis not present

## 2018-02-06 DIAGNOSIS — Z23 Encounter for immunization: Secondary | ICD-10-CM | POA: Diagnosis not present

## 2018-02-06 DIAGNOSIS — D689 Coagulation defect, unspecified: Secondary | ICD-10-CM | POA: Diagnosis not present

## 2018-02-08 DIAGNOSIS — D689 Coagulation defect, unspecified: Secondary | ICD-10-CM | POA: Diagnosis not present

## 2018-02-08 DIAGNOSIS — Z23 Encounter for immunization: Secondary | ICD-10-CM | POA: Diagnosis not present

## 2018-02-08 DIAGNOSIS — N2581 Secondary hyperparathyroidism of renal origin: Secondary | ICD-10-CM | POA: Diagnosis not present

## 2018-02-08 DIAGNOSIS — D631 Anemia in chronic kidney disease: Secondary | ICD-10-CM | POA: Diagnosis not present

## 2018-02-08 DIAGNOSIS — N186 End stage renal disease: Secondary | ICD-10-CM | POA: Diagnosis not present

## 2018-02-08 DIAGNOSIS — D509 Iron deficiency anemia, unspecified: Secondary | ICD-10-CM | POA: Diagnosis not present

## 2018-02-10 DIAGNOSIS — Z23 Encounter for immunization: Secondary | ICD-10-CM | POA: Diagnosis not present

## 2018-02-10 DIAGNOSIS — D689 Coagulation defect, unspecified: Secondary | ICD-10-CM | POA: Diagnosis not present

## 2018-02-10 DIAGNOSIS — D631 Anemia in chronic kidney disease: Secondary | ICD-10-CM | POA: Diagnosis not present

## 2018-02-10 DIAGNOSIS — D509 Iron deficiency anemia, unspecified: Secondary | ICD-10-CM | POA: Diagnosis not present

## 2018-02-10 DIAGNOSIS — N2581 Secondary hyperparathyroidism of renal origin: Secondary | ICD-10-CM | POA: Diagnosis not present

## 2018-02-10 DIAGNOSIS — N186 End stage renal disease: Secondary | ICD-10-CM | POA: Diagnosis not present

## 2018-02-12 DIAGNOSIS — D509 Iron deficiency anemia, unspecified: Secondary | ICD-10-CM | POA: Diagnosis not present

## 2018-02-12 DIAGNOSIS — N2581 Secondary hyperparathyroidism of renal origin: Secondary | ICD-10-CM | POA: Diagnosis not present

## 2018-02-12 DIAGNOSIS — Z23 Encounter for immunization: Secondary | ICD-10-CM | POA: Diagnosis not present

## 2018-02-12 DIAGNOSIS — D689 Coagulation defect, unspecified: Secondary | ICD-10-CM | POA: Diagnosis not present

## 2018-02-12 DIAGNOSIS — D631 Anemia in chronic kidney disease: Secondary | ICD-10-CM | POA: Diagnosis not present

## 2018-02-12 DIAGNOSIS — N186 End stage renal disease: Secondary | ICD-10-CM | POA: Diagnosis not present

## 2018-02-13 DIAGNOSIS — G4733 Obstructive sleep apnea (adult) (pediatric): Secondary | ICD-10-CM | POA: Diagnosis not present

## 2018-02-15 DIAGNOSIS — D689 Coagulation defect, unspecified: Secondary | ICD-10-CM | POA: Diagnosis not present

## 2018-02-15 DIAGNOSIS — Z23 Encounter for immunization: Secondary | ICD-10-CM | POA: Diagnosis not present

## 2018-02-15 DIAGNOSIS — D631 Anemia in chronic kidney disease: Secondary | ICD-10-CM | POA: Diagnosis not present

## 2018-02-15 DIAGNOSIS — D509 Iron deficiency anemia, unspecified: Secondary | ICD-10-CM | POA: Diagnosis not present

## 2018-02-15 DIAGNOSIS — N186 End stage renal disease: Secondary | ICD-10-CM | POA: Diagnosis not present

## 2018-02-15 DIAGNOSIS — N2581 Secondary hyperparathyroidism of renal origin: Secondary | ICD-10-CM | POA: Diagnosis not present

## 2018-02-16 DIAGNOSIS — Z992 Dependence on renal dialysis: Secondary | ICD-10-CM | POA: Diagnosis not present

## 2018-02-16 DIAGNOSIS — I871 Compression of vein: Secondary | ICD-10-CM | POA: Diagnosis not present

## 2018-02-16 DIAGNOSIS — N186 End stage renal disease: Secondary | ICD-10-CM | POA: Diagnosis not present

## 2018-02-17 DIAGNOSIS — Z23 Encounter for immunization: Secondary | ICD-10-CM | POA: Diagnosis not present

## 2018-02-17 DIAGNOSIS — D631 Anemia in chronic kidney disease: Secondary | ICD-10-CM | POA: Diagnosis not present

## 2018-02-17 DIAGNOSIS — N186 End stage renal disease: Secondary | ICD-10-CM | POA: Diagnosis not present

## 2018-02-17 DIAGNOSIS — D509 Iron deficiency anemia, unspecified: Secondary | ICD-10-CM | POA: Diagnosis not present

## 2018-02-17 DIAGNOSIS — N2581 Secondary hyperparathyroidism of renal origin: Secondary | ICD-10-CM | POA: Diagnosis not present

## 2018-02-17 DIAGNOSIS — D689 Coagulation defect, unspecified: Secondary | ICD-10-CM | POA: Diagnosis not present

## 2018-02-19 DIAGNOSIS — N186 End stage renal disease: Secondary | ICD-10-CM | POA: Diagnosis not present

## 2018-02-19 DIAGNOSIS — D689 Coagulation defect, unspecified: Secondary | ICD-10-CM | POA: Diagnosis not present

## 2018-02-19 DIAGNOSIS — D509 Iron deficiency anemia, unspecified: Secondary | ICD-10-CM | POA: Diagnosis not present

## 2018-02-19 DIAGNOSIS — N2581 Secondary hyperparathyroidism of renal origin: Secondary | ICD-10-CM | POA: Diagnosis not present

## 2018-02-19 DIAGNOSIS — Z23 Encounter for immunization: Secondary | ICD-10-CM | POA: Diagnosis not present

## 2018-02-19 DIAGNOSIS — D631 Anemia in chronic kidney disease: Secondary | ICD-10-CM | POA: Diagnosis not present

## 2018-02-22 DIAGNOSIS — Z23 Encounter for immunization: Secondary | ICD-10-CM | POA: Diagnosis not present

## 2018-02-22 DIAGNOSIS — N2581 Secondary hyperparathyroidism of renal origin: Secondary | ICD-10-CM | POA: Diagnosis not present

## 2018-02-22 DIAGNOSIS — D509 Iron deficiency anemia, unspecified: Secondary | ICD-10-CM | POA: Diagnosis not present

## 2018-02-22 DIAGNOSIS — N186 End stage renal disease: Secondary | ICD-10-CM | POA: Diagnosis not present

## 2018-02-22 DIAGNOSIS — D689 Coagulation defect, unspecified: Secondary | ICD-10-CM | POA: Diagnosis not present

## 2018-02-22 DIAGNOSIS — D631 Anemia in chronic kidney disease: Secondary | ICD-10-CM | POA: Diagnosis not present

## 2018-02-24 DIAGNOSIS — Z23 Encounter for immunization: Secondary | ICD-10-CM | POA: Diagnosis not present

## 2018-02-24 DIAGNOSIS — N186 End stage renal disease: Secondary | ICD-10-CM | POA: Diagnosis not present

## 2018-02-24 DIAGNOSIS — N2581 Secondary hyperparathyroidism of renal origin: Secondary | ICD-10-CM | POA: Diagnosis not present

## 2018-02-24 DIAGNOSIS — D631 Anemia in chronic kidney disease: Secondary | ICD-10-CM | POA: Diagnosis not present

## 2018-02-24 DIAGNOSIS — D689 Coagulation defect, unspecified: Secondary | ICD-10-CM | POA: Diagnosis not present

## 2018-02-24 DIAGNOSIS — D509 Iron deficiency anemia, unspecified: Secondary | ICD-10-CM | POA: Diagnosis not present

## 2018-02-26 DIAGNOSIS — D631 Anemia in chronic kidney disease: Secondary | ICD-10-CM | POA: Diagnosis not present

## 2018-02-26 DIAGNOSIS — Z23 Encounter for immunization: Secondary | ICD-10-CM | POA: Diagnosis not present

## 2018-02-26 DIAGNOSIS — N186 End stage renal disease: Secondary | ICD-10-CM | POA: Diagnosis not present

## 2018-02-26 DIAGNOSIS — N2581 Secondary hyperparathyroidism of renal origin: Secondary | ICD-10-CM | POA: Diagnosis not present

## 2018-02-26 DIAGNOSIS — D689 Coagulation defect, unspecified: Secondary | ICD-10-CM | POA: Diagnosis not present

## 2018-02-26 DIAGNOSIS — D509 Iron deficiency anemia, unspecified: Secondary | ICD-10-CM | POA: Diagnosis not present

## 2018-03-01 DIAGNOSIS — I129 Hypertensive chronic kidney disease with stage 1 through stage 4 chronic kidney disease, or unspecified chronic kidney disease: Secondary | ICD-10-CM | POA: Diagnosis not present

## 2018-03-01 DIAGNOSIS — D689 Coagulation defect, unspecified: Secondary | ICD-10-CM | POA: Diagnosis not present

## 2018-03-01 DIAGNOSIS — N186 End stage renal disease: Secondary | ICD-10-CM | POA: Diagnosis not present

## 2018-03-01 DIAGNOSIS — N2581 Secondary hyperparathyroidism of renal origin: Secondary | ICD-10-CM | POA: Diagnosis not present

## 2018-03-01 DIAGNOSIS — D631 Anemia in chronic kidney disease: Secondary | ICD-10-CM | POA: Diagnosis not present

## 2018-03-01 DIAGNOSIS — D509 Iron deficiency anemia, unspecified: Secondary | ICD-10-CM | POA: Diagnosis not present

## 2018-03-01 DIAGNOSIS — Z23 Encounter for immunization: Secondary | ICD-10-CM | POA: Diagnosis not present

## 2018-03-01 DIAGNOSIS — Z992 Dependence on renal dialysis: Secondary | ICD-10-CM | POA: Diagnosis not present

## 2018-03-03 DIAGNOSIS — N186 End stage renal disease: Secondary | ICD-10-CM | POA: Diagnosis not present

## 2018-03-03 DIAGNOSIS — N2581 Secondary hyperparathyroidism of renal origin: Secondary | ICD-10-CM | POA: Diagnosis not present

## 2018-03-03 DIAGNOSIS — E1122 Type 2 diabetes mellitus with diabetic chronic kidney disease: Secondary | ICD-10-CM | POA: Diagnosis not present

## 2018-03-03 DIAGNOSIS — D689 Coagulation defect, unspecified: Secondary | ICD-10-CM | POA: Diagnosis not present

## 2018-03-03 DIAGNOSIS — D631 Anemia in chronic kidney disease: Secondary | ICD-10-CM | POA: Diagnosis not present

## 2018-03-05 DIAGNOSIS — D631 Anemia in chronic kidney disease: Secondary | ICD-10-CM | POA: Diagnosis not present

## 2018-03-05 DIAGNOSIS — N186 End stage renal disease: Secondary | ICD-10-CM | POA: Diagnosis not present

## 2018-03-05 DIAGNOSIS — E1122 Type 2 diabetes mellitus with diabetic chronic kidney disease: Secondary | ICD-10-CM | POA: Diagnosis not present

## 2018-03-05 DIAGNOSIS — N2581 Secondary hyperparathyroidism of renal origin: Secondary | ICD-10-CM | POA: Diagnosis not present

## 2018-03-05 DIAGNOSIS — D689 Coagulation defect, unspecified: Secondary | ICD-10-CM | POA: Diagnosis not present

## 2018-03-08 DIAGNOSIS — D689 Coagulation defect, unspecified: Secondary | ICD-10-CM | POA: Diagnosis not present

## 2018-03-08 DIAGNOSIS — D631 Anemia in chronic kidney disease: Secondary | ICD-10-CM | POA: Diagnosis not present

## 2018-03-08 DIAGNOSIS — N2581 Secondary hyperparathyroidism of renal origin: Secondary | ICD-10-CM | POA: Diagnosis not present

## 2018-03-08 DIAGNOSIS — N186 End stage renal disease: Secondary | ICD-10-CM | POA: Diagnosis not present

## 2018-03-08 DIAGNOSIS — E1122 Type 2 diabetes mellitus with diabetic chronic kidney disease: Secondary | ICD-10-CM | POA: Diagnosis not present

## 2018-03-10 DIAGNOSIS — N186 End stage renal disease: Secondary | ICD-10-CM | POA: Diagnosis not present

## 2018-03-10 DIAGNOSIS — D689 Coagulation defect, unspecified: Secondary | ICD-10-CM | POA: Diagnosis not present

## 2018-03-10 DIAGNOSIS — E1122 Type 2 diabetes mellitus with diabetic chronic kidney disease: Secondary | ICD-10-CM | POA: Diagnosis not present

## 2018-03-10 DIAGNOSIS — N2581 Secondary hyperparathyroidism of renal origin: Secondary | ICD-10-CM | POA: Diagnosis not present

## 2018-03-10 DIAGNOSIS — D631 Anemia in chronic kidney disease: Secondary | ICD-10-CM | POA: Diagnosis not present

## 2018-03-12 DIAGNOSIS — E1122 Type 2 diabetes mellitus with diabetic chronic kidney disease: Secondary | ICD-10-CM | POA: Diagnosis not present

## 2018-03-12 DIAGNOSIS — D689 Coagulation defect, unspecified: Secondary | ICD-10-CM | POA: Diagnosis not present

## 2018-03-12 DIAGNOSIS — N186 End stage renal disease: Secondary | ICD-10-CM | POA: Diagnosis not present

## 2018-03-12 DIAGNOSIS — D631 Anemia in chronic kidney disease: Secondary | ICD-10-CM | POA: Diagnosis not present

## 2018-03-12 DIAGNOSIS — N2581 Secondary hyperparathyroidism of renal origin: Secondary | ICD-10-CM | POA: Diagnosis not present

## 2018-03-15 DIAGNOSIS — E1122 Type 2 diabetes mellitus with diabetic chronic kidney disease: Secondary | ICD-10-CM | POA: Diagnosis not present

## 2018-03-15 DIAGNOSIS — D689 Coagulation defect, unspecified: Secondary | ICD-10-CM | POA: Diagnosis not present

## 2018-03-15 DIAGNOSIS — G4733 Obstructive sleep apnea (adult) (pediatric): Secondary | ICD-10-CM | POA: Diagnosis not present

## 2018-03-15 DIAGNOSIS — N186 End stage renal disease: Secondary | ICD-10-CM | POA: Diagnosis not present

## 2018-03-15 DIAGNOSIS — D631 Anemia in chronic kidney disease: Secondary | ICD-10-CM | POA: Diagnosis not present

## 2018-03-15 DIAGNOSIS — N2581 Secondary hyperparathyroidism of renal origin: Secondary | ICD-10-CM | POA: Diagnosis not present

## 2018-03-17 DIAGNOSIS — N186 End stage renal disease: Secondary | ICD-10-CM | POA: Diagnosis not present

## 2018-03-17 DIAGNOSIS — D631 Anemia in chronic kidney disease: Secondary | ICD-10-CM | POA: Diagnosis not present

## 2018-03-17 DIAGNOSIS — E1122 Type 2 diabetes mellitus with diabetic chronic kidney disease: Secondary | ICD-10-CM | POA: Diagnosis not present

## 2018-03-17 DIAGNOSIS — N2581 Secondary hyperparathyroidism of renal origin: Secondary | ICD-10-CM | POA: Diagnosis not present

## 2018-03-17 DIAGNOSIS — D689 Coagulation defect, unspecified: Secondary | ICD-10-CM | POA: Diagnosis not present

## 2018-03-19 DIAGNOSIS — N2581 Secondary hyperparathyroidism of renal origin: Secondary | ICD-10-CM | POA: Diagnosis not present

## 2018-03-19 DIAGNOSIS — N186 End stage renal disease: Secondary | ICD-10-CM | POA: Diagnosis not present

## 2018-03-19 DIAGNOSIS — E1122 Type 2 diabetes mellitus with diabetic chronic kidney disease: Secondary | ICD-10-CM | POA: Diagnosis not present

## 2018-03-19 DIAGNOSIS — D689 Coagulation defect, unspecified: Secondary | ICD-10-CM | POA: Diagnosis not present

## 2018-03-19 DIAGNOSIS — D631 Anemia in chronic kidney disease: Secondary | ICD-10-CM | POA: Diagnosis not present

## 2018-03-22 DIAGNOSIS — N186 End stage renal disease: Secondary | ICD-10-CM | POA: Diagnosis not present

## 2018-03-22 DIAGNOSIS — D631 Anemia in chronic kidney disease: Secondary | ICD-10-CM | POA: Diagnosis not present

## 2018-03-22 DIAGNOSIS — D689 Coagulation defect, unspecified: Secondary | ICD-10-CM | POA: Diagnosis not present

## 2018-03-22 DIAGNOSIS — E1122 Type 2 diabetes mellitus with diabetic chronic kidney disease: Secondary | ICD-10-CM | POA: Diagnosis not present

## 2018-03-22 DIAGNOSIS — N2581 Secondary hyperparathyroidism of renal origin: Secondary | ICD-10-CM | POA: Diagnosis not present

## 2018-03-24 DIAGNOSIS — D631 Anemia in chronic kidney disease: Secondary | ICD-10-CM | POA: Diagnosis not present

## 2018-03-24 DIAGNOSIS — D689 Coagulation defect, unspecified: Secondary | ICD-10-CM | POA: Diagnosis not present

## 2018-03-24 DIAGNOSIS — N2581 Secondary hyperparathyroidism of renal origin: Secondary | ICD-10-CM | POA: Diagnosis not present

## 2018-03-24 DIAGNOSIS — E1122 Type 2 diabetes mellitus with diabetic chronic kidney disease: Secondary | ICD-10-CM | POA: Diagnosis not present

## 2018-03-24 DIAGNOSIS — N186 End stage renal disease: Secondary | ICD-10-CM | POA: Diagnosis not present

## 2018-03-26 DIAGNOSIS — N186 End stage renal disease: Secondary | ICD-10-CM | POA: Diagnosis not present

## 2018-03-26 DIAGNOSIS — D631 Anemia in chronic kidney disease: Secondary | ICD-10-CM | POA: Diagnosis not present

## 2018-03-26 DIAGNOSIS — D689 Coagulation defect, unspecified: Secondary | ICD-10-CM | POA: Diagnosis not present

## 2018-03-26 DIAGNOSIS — E1122 Type 2 diabetes mellitus with diabetic chronic kidney disease: Secondary | ICD-10-CM | POA: Diagnosis not present

## 2018-03-26 DIAGNOSIS — N2581 Secondary hyperparathyroidism of renal origin: Secondary | ICD-10-CM | POA: Diagnosis not present

## 2018-03-29 DIAGNOSIS — N2581 Secondary hyperparathyroidism of renal origin: Secondary | ICD-10-CM | POA: Diagnosis not present

## 2018-03-29 DIAGNOSIS — E1122 Type 2 diabetes mellitus with diabetic chronic kidney disease: Secondary | ICD-10-CM | POA: Diagnosis not present

## 2018-03-29 DIAGNOSIS — D689 Coagulation defect, unspecified: Secondary | ICD-10-CM | POA: Diagnosis not present

## 2018-03-29 DIAGNOSIS — D631 Anemia in chronic kidney disease: Secondary | ICD-10-CM | POA: Diagnosis not present

## 2018-03-29 DIAGNOSIS — N186 End stage renal disease: Secondary | ICD-10-CM | POA: Diagnosis not present

## 2018-03-31 DIAGNOSIS — D689 Coagulation defect, unspecified: Secondary | ICD-10-CM | POA: Diagnosis not present

## 2018-03-31 DIAGNOSIS — N186 End stage renal disease: Secondary | ICD-10-CM | POA: Diagnosis not present

## 2018-03-31 DIAGNOSIS — E1122 Type 2 diabetes mellitus with diabetic chronic kidney disease: Secondary | ICD-10-CM | POA: Diagnosis not present

## 2018-03-31 DIAGNOSIS — N2581 Secondary hyperparathyroidism of renal origin: Secondary | ICD-10-CM | POA: Diagnosis not present

## 2018-03-31 DIAGNOSIS — D631 Anemia in chronic kidney disease: Secondary | ICD-10-CM | POA: Diagnosis not present

## 2018-04-01 DIAGNOSIS — I129 Hypertensive chronic kidney disease with stage 1 through stage 4 chronic kidney disease, or unspecified chronic kidney disease: Secondary | ICD-10-CM | POA: Diagnosis not present

## 2018-04-01 DIAGNOSIS — Z992 Dependence on renal dialysis: Secondary | ICD-10-CM | POA: Diagnosis not present

## 2018-04-01 DIAGNOSIS — N186 End stage renal disease: Secondary | ICD-10-CM | POA: Diagnosis not present

## 2018-04-02 DIAGNOSIS — N2581 Secondary hyperparathyroidism of renal origin: Secondary | ICD-10-CM | POA: Diagnosis not present

## 2018-04-02 DIAGNOSIS — E1122 Type 2 diabetes mellitus with diabetic chronic kidney disease: Secondary | ICD-10-CM | POA: Diagnosis not present

## 2018-04-02 DIAGNOSIS — D631 Anemia in chronic kidney disease: Secondary | ICD-10-CM | POA: Diagnosis not present

## 2018-04-02 DIAGNOSIS — D689 Coagulation defect, unspecified: Secondary | ICD-10-CM | POA: Diagnosis not present

## 2018-04-02 DIAGNOSIS — N186 End stage renal disease: Secondary | ICD-10-CM | POA: Diagnosis not present

## 2018-04-05 DIAGNOSIS — N186 End stage renal disease: Secondary | ICD-10-CM | POA: Diagnosis not present

## 2018-04-05 DIAGNOSIS — N2581 Secondary hyperparathyroidism of renal origin: Secondary | ICD-10-CM | POA: Diagnosis not present

## 2018-04-05 DIAGNOSIS — D631 Anemia in chronic kidney disease: Secondary | ICD-10-CM | POA: Diagnosis not present

## 2018-04-05 DIAGNOSIS — E1122 Type 2 diabetes mellitus with diabetic chronic kidney disease: Secondary | ICD-10-CM | POA: Diagnosis not present

## 2018-04-05 DIAGNOSIS — D689 Coagulation defect, unspecified: Secondary | ICD-10-CM | POA: Diagnosis not present

## 2018-04-07 DIAGNOSIS — N2581 Secondary hyperparathyroidism of renal origin: Secondary | ICD-10-CM | POA: Diagnosis not present

## 2018-04-07 DIAGNOSIS — N186 End stage renal disease: Secondary | ICD-10-CM | POA: Diagnosis not present

## 2018-04-07 DIAGNOSIS — D631 Anemia in chronic kidney disease: Secondary | ICD-10-CM | POA: Diagnosis not present

## 2018-04-07 DIAGNOSIS — E1122 Type 2 diabetes mellitus with diabetic chronic kidney disease: Secondary | ICD-10-CM | POA: Diagnosis not present

## 2018-04-07 DIAGNOSIS — D689 Coagulation defect, unspecified: Secondary | ICD-10-CM | POA: Diagnosis not present

## 2018-04-08 DIAGNOSIS — D631 Anemia in chronic kidney disease: Secondary | ICD-10-CM | POA: Diagnosis not present

## 2018-04-08 DIAGNOSIS — N2581 Secondary hyperparathyroidism of renal origin: Secondary | ICD-10-CM | POA: Diagnosis not present

## 2018-04-08 DIAGNOSIS — N186 End stage renal disease: Secondary | ICD-10-CM | POA: Diagnosis not present

## 2018-04-08 DIAGNOSIS — E1122 Type 2 diabetes mellitus with diabetic chronic kidney disease: Secondary | ICD-10-CM | POA: Diagnosis not present

## 2018-04-08 DIAGNOSIS — D689 Coagulation defect, unspecified: Secondary | ICD-10-CM | POA: Diagnosis not present

## 2018-04-09 DIAGNOSIS — D631 Anemia in chronic kidney disease: Secondary | ICD-10-CM | POA: Diagnosis not present

## 2018-04-09 DIAGNOSIS — N2581 Secondary hyperparathyroidism of renal origin: Secondary | ICD-10-CM | POA: Diagnosis not present

## 2018-04-09 DIAGNOSIS — N186 End stage renal disease: Secondary | ICD-10-CM | POA: Diagnosis not present

## 2018-04-09 DIAGNOSIS — D689 Coagulation defect, unspecified: Secondary | ICD-10-CM | POA: Diagnosis not present

## 2018-04-09 DIAGNOSIS — E1122 Type 2 diabetes mellitus with diabetic chronic kidney disease: Secondary | ICD-10-CM | POA: Diagnosis not present

## 2018-04-12 DIAGNOSIS — N2581 Secondary hyperparathyroidism of renal origin: Secondary | ICD-10-CM | POA: Diagnosis not present

## 2018-04-12 DIAGNOSIS — D631 Anemia in chronic kidney disease: Secondary | ICD-10-CM | POA: Diagnosis not present

## 2018-04-12 DIAGNOSIS — E1122 Type 2 diabetes mellitus with diabetic chronic kidney disease: Secondary | ICD-10-CM | POA: Diagnosis not present

## 2018-04-12 DIAGNOSIS — N186 End stage renal disease: Secondary | ICD-10-CM | POA: Diagnosis not present

## 2018-04-12 DIAGNOSIS — D689 Coagulation defect, unspecified: Secondary | ICD-10-CM | POA: Diagnosis not present

## 2018-04-14 DIAGNOSIS — N186 End stage renal disease: Secondary | ICD-10-CM | POA: Diagnosis not present

## 2018-04-14 DIAGNOSIS — E1122 Type 2 diabetes mellitus with diabetic chronic kidney disease: Secondary | ICD-10-CM | POA: Diagnosis not present

## 2018-04-14 DIAGNOSIS — D689 Coagulation defect, unspecified: Secondary | ICD-10-CM | POA: Diagnosis not present

## 2018-04-14 DIAGNOSIS — D631 Anemia in chronic kidney disease: Secondary | ICD-10-CM | POA: Diagnosis not present

## 2018-04-14 DIAGNOSIS — N2581 Secondary hyperparathyroidism of renal origin: Secondary | ICD-10-CM | POA: Diagnosis not present

## 2018-04-15 DIAGNOSIS — R6889 Other general symptoms and signs: Secondary | ICD-10-CM | POA: Diagnosis not present

## 2018-04-15 DIAGNOSIS — Z992 Dependence on renal dialysis: Secondary | ICD-10-CM | POA: Diagnosis not present

## 2018-04-15 DIAGNOSIS — N186 End stage renal disease: Secondary | ICD-10-CM | POA: Diagnosis not present

## 2018-04-15 DIAGNOSIS — G4733 Obstructive sleep apnea (adult) (pediatric): Secondary | ICD-10-CM | POA: Diagnosis not present

## 2018-04-15 DIAGNOSIS — I871 Compression of vein: Secondary | ICD-10-CM | POA: Diagnosis not present

## 2018-04-16 DIAGNOSIS — D689 Coagulation defect, unspecified: Secondary | ICD-10-CM | POA: Diagnosis not present

## 2018-04-16 DIAGNOSIS — N2581 Secondary hyperparathyroidism of renal origin: Secondary | ICD-10-CM | POA: Diagnosis not present

## 2018-04-16 DIAGNOSIS — D631 Anemia in chronic kidney disease: Secondary | ICD-10-CM | POA: Diagnosis not present

## 2018-04-16 DIAGNOSIS — E1122 Type 2 diabetes mellitus with diabetic chronic kidney disease: Secondary | ICD-10-CM | POA: Diagnosis not present

## 2018-04-16 DIAGNOSIS — N186 End stage renal disease: Secondary | ICD-10-CM | POA: Diagnosis not present

## 2018-04-19 DIAGNOSIS — D689 Coagulation defect, unspecified: Secondary | ICD-10-CM | POA: Diagnosis not present

## 2018-04-19 DIAGNOSIS — E1122 Type 2 diabetes mellitus with diabetic chronic kidney disease: Secondary | ICD-10-CM | POA: Diagnosis not present

## 2018-04-19 DIAGNOSIS — D631 Anemia in chronic kidney disease: Secondary | ICD-10-CM | POA: Diagnosis not present

## 2018-04-19 DIAGNOSIS — N2581 Secondary hyperparathyroidism of renal origin: Secondary | ICD-10-CM | POA: Diagnosis not present

## 2018-04-19 DIAGNOSIS — N186 End stage renal disease: Secondary | ICD-10-CM | POA: Diagnosis not present

## 2018-04-21 DIAGNOSIS — N2581 Secondary hyperparathyroidism of renal origin: Secondary | ICD-10-CM | POA: Diagnosis not present

## 2018-04-21 DIAGNOSIS — E1122 Type 2 diabetes mellitus with diabetic chronic kidney disease: Secondary | ICD-10-CM | POA: Diagnosis not present

## 2018-04-21 DIAGNOSIS — D689 Coagulation defect, unspecified: Secondary | ICD-10-CM | POA: Diagnosis not present

## 2018-04-21 DIAGNOSIS — D631 Anemia in chronic kidney disease: Secondary | ICD-10-CM | POA: Diagnosis not present

## 2018-04-21 DIAGNOSIS — N186 End stage renal disease: Secondary | ICD-10-CM | POA: Diagnosis not present

## 2018-04-23 DIAGNOSIS — E1122 Type 2 diabetes mellitus with diabetic chronic kidney disease: Secondary | ICD-10-CM | POA: Diagnosis not present

## 2018-04-23 DIAGNOSIS — N186 End stage renal disease: Secondary | ICD-10-CM | POA: Diagnosis not present

## 2018-04-23 DIAGNOSIS — D631 Anemia in chronic kidney disease: Secondary | ICD-10-CM | POA: Diagnosis not present

## 2018-04-23 DIAGNOSIS — D689 Coagulation defect, unspecified: Secondary | ICD-10-CM | POA: Diagnosis not present

## 2018-04-23 DIAGNOSIS — N2581 Secondary hyperparathyroidism of renal origin: Secondary | ICD-10-CM | POA: Diagnosis not present

## 2018-04-25 DIAGNOSIS — D689 Coagulation defect, unspecified: Secondary | ICD-10-CM | POA: Diagnosis not present

## 2018-04-25 DIAGNOSIS — D631 Anemia in chronic kidney disease: Secondary | ICD-10-CM | POA: Diagnosis not present

## 2018-04-25 DIAGNOSIS — N2581 Secondary hyperparathyroidism of renal origin: Secondary | ICD-10-CM | POA: Diagnosis not present

## 2018-04-25 DIAGNOSIS — E1122 Type 2 diabetes mellitus with diabetic chronic kidney disease: Secondary | ICD-10-CM | POA: Diagnosis not present

## 2018-04-25 DIAGNOSIS — N186 End stage renal disease: Secondary | ICD-10-CM | POA: Diagnosis not present

## 2018-04-27 DIAGNOSIS — N186 End stage renal disease: Secondary | ICD-10-CM | POA: Diagnosis not present

## 2018-04-27 DIAGNOSIS — E1122 Type 2 diabetes mellitus with diabetic chronic kidney disease: Secondary | ICD-10-CM | POA: Diagnosis not present

## 2018-04-27 DIAGNOSIS — D689 Coagulation defect, unspecified: Secondary | ICD-10-CM | POA: Diagnosis not present

## 2018-04-27 DIAGNOSIS — D631 Anemia in chronic kidney disease: Secondary | ICD-10-CM | POA: Diagnosis not present

## 2018-04-27 DIAGNOSIS — N2581 Secondary hyperparathyroidism of renal origin: Secondary | ICD-10-CM | POA: Diagnosis not present

## 2018-04-28 DIAGNOSIS — E118 Type 2 diabetes mellitus with unspecified complications: Secondary | ICD-10-CM | POA: Diagnosis not present

## 2018-04-30 DIAGNOSIS — N186 End stage renal disease: Secondary | ICD-10-CM | POA: Diagnosis not present

## 2018-04-30 DIAGNOSIS — D689 Coagulation defect, unspecified: Secondary | ICD-10-CM | POA: Diagnosis not present

## 2018-04-30 DIAGNOSIS — E1122 Type 2 diabetes mellitus with diabetic chronic kidney disease: Secondary | ICD-10-CM | POA: Diagnosis not present

## 2018-04-30 DIAGNOSIS — D631 Anemia in chronic kidney disease: Secondary | ICD-10-CM | POA: Diagnosis not present

## 2018-04-30 DIAGNOSIS — N2581 Secondary hyperparathyroidism of renal origin: Secondary | ICD-10-CM | POA: Diagnosis not present

## 2018-05-01 DIAGNOSIS — Z992 Dependence on renal dialysis: Secondary | ICD-10-CM | POA: Diagnosis not present

## 2018-05-01 DIAGNOSIS — I129 Hypertensive chronic kidney disease with stage 1 through stage 4 chronic kidney disease, or unspecified chronic kidney disease: Secondary | ICD-10-CM | POA: Diagnosis not present

## 2018-05-01 DIAGNOSIS — N186 End stage renal disease: Secondary | ICD-10-CM | POA: Diagnosis not present

## 2018-05-03 DIAGNOSIS — R52 Pain, unspecified: Secondary | ICD-10-CM | POA: Diagnosis not present

## 2018-05-03 DIAGNOSIS — E1122 Type 2 diabetes mellitus with diabetic chronic kidney disease: Secondary | ICD-10-CM | POA: Diagnosis not present

## 2018-05-03 DIAGNOSIS — D631 Anemia in chronic kidney disease: Secondary | ICD-10-CM | POA: Diagnosis not present

## 2018-05-03 DIAGNOSIS — N186 End stage renal disease: Secondary | ICD-10-CM | POA: Diagnosis not present

## 2018-05-03 DIAGNOSIS — N2581 Secondary hyperparathyroidism of renal origin: Secondary | ICD-10-CM | POA: Diagnosis not present

## 2018-05-03 DIAGNOSIS — Z992 Dependence on renal dialysis: Secondary | ICD-10-CM | POA: Diagnosis not present

## 2018-05-03 DIAGNOSIS — D689 Coagulation defect, unspecified: Secondary | ICD-10-CM | POA: Diagnosis not present

## 2018-05-04 DIAGNOSIS — E118 Type 2 diabetes mellitus with unspecified complications: Secondary | ICD-10-CM | POA: Diagnosis not present

## 2018-05-04 DIAGNOSIS — I10 Essential (primary) hypertension: Secondary | ICD-10-CM | POA: Diagnosis not present

## 2018-05-04 DIAGNOSIS — N186 End stage renal disease: Secondary | ICD-10-CM | POA: Diagnosis not present

## 2018-05-04 DIAGNOSIS — E789 Disorder of lipoprotein metabolism, unspecified: Secondary | ICD-10-CM | POA: Diagnosis not present

## 2018-05-05 DIAGNOSIS — D631 Anemia in chronic kidney disease: Secondary | ICD-10-CM | POA: Diagnosis not present

## 2018-05-05 DIAGNOSIS — D689 Coagulation defect, unspecified: Secondary | ICD-10-CM | POA: Diagnosis not present

## 2018-05-05 DIAGNOSIS — N2581 Secondary hyperparathyroidism of renal origin: Secondary | ICD-10-CM | POA: Diagnosis not present

## 2018-05-05 DIAGNOSIS — N186 End stage renal disease: Secondary | ICD-10-CM | POA: Diagnosis not present

## 2018-05-05 DIAGNOSIS — Z992 Dependence on renal dialysis: Secondary | ICD-10-CM | POA: Diagnosis not present

## 2018-05-05 DIAGNOSIS — R52 Pain, unspecified: Secondary | ICD-10-CM | POA: Diagnosis not present

## 2018-05-05 DIAGNOSIS — E1122 Type 2 diabetes mellitus with diabetic chronic kidney disease: Secondary | ICD-10-CM | POA: Diagnosis not present

## 2018-05-07 DIAGNOSIS — R52 Pain, unspecified: Secondary | ICD-10-CM | POA: Diagnosis not present

## 2018-05-07 DIAGNOSIS — Z992 Dependence on renal dialysis: Secondary | ICD-10-CM | POA: Diagnosis not present

## 2018-05-07 DIAGNOSIS — N186 End stage renal disease: Secondary | ICD-10-CM | POA: Diagnosis not present

## 2018-05-07 DIAGNOSIS — N2581 Secondary hyperparathyroidism of renal origin: Secondary | ICD-10-CM | POA: Diagnosis not present

## 2018-05-07 DIAGNOSIS — D631 Anemia in chronic kidney disease: Secondary | ICD-10-CM | POA: Diagnosis not present

## 2018-05-07 DIAGNOSIS — D689 Coagulation defect, unspecified: Secondary | ICD-10-CM | POA: Diagnosis not present

## 2018-05-07 DIAGNOSIS — E1122 Type 2 diabetes mellitus with diabetic chronic kidney disease: Secondary | ICD-10-CM | POA: Diagnosis not present

## 2018-05-10 DIAGNOSIS — R52 Pain, unspecified: Secondary | ICD-10-CM | POA: Diagnosis not present

## 2018-05-10 DIAGNOSIS — D631 Anemia in chronic kidney disease: Secondary | ICD-10-CM | POA: Diagnosis not present

## 2018-05-10 DIAGNOSIS — N2581 Secondary hyperparathyroidism of renal origin: Secondary | ICD-10-CM | POA: Diagnosis not present

## 2018-05-10 DIAGNOSIS — Z992 Dependence on renal dialysis: Secondary | ICD-10-CM | POA: Diagnosis not present

## 2018-05-10 DIAGNOSIS — E1122 Type 2 diabetes mellitus with diabetic chronic kidney disease: Secondary | ICD-10-CM | POA: Diagnosis not present

## 2018-05-10 DIAGNOSIS — D689 Coagulation defect, unspecified: Secondary | ICD-10-CM | POA: Diagnosis not present

## 2018-05-10 DIAGNOSIS — N186 End stage renal disease: Secondary | ICD-10-CM | POA: Diagnosis not present

## 2018-05-12 DIAGNOSIS — R52 Pain, unspecified: Secondary | ICD-10-CM | POA: Diagnosis not present

## 2018-05-12 DIAGNOSIS — D689 Coagulation defect, unspecified: Secondary | ICD-10-CM | POA: Diagnosis not present

## 2018-05-12 DIAGNOSIS — E1122 Type 2 diabetes mellitus with diabetic chronic kidney disease: Secondary | ICD-10-CM | POA: Diagnosis not present

## 2018-05-12 DIAGNOSIS — N2581 Secondary hyperparathyroidism of renal origin: Secondary | ICD-10-CM | POA: Diagnosis not present

## 2018-05-12 DIAGNOSIS — Z992 Dependence on renal dialysis: Secondary | ICD-10-CM | POA: Diagnosis not present

## 2018-05-12 DIAGNOSIS — D631 Anemia in chronic kidney disease: Secondary | ICD-10-CM | POA: Diagnosis not present

## 2018-05-12 DIAGNOSIS — N186 End stage renal disease: Secondary | ICD-10-CM | POA: Diagnosis not present

## 2018-05-14 DIAGNOSIS — N186 End stage renal disease: Secondary | ICD-10-CM | POA: Diagnosis not present

## 2018-05-14 DIAGNOSIS — R52 Pain, unspecified: Secondary | ICD-10-CM | POA: Diagnosis not present

## 2018-05-14 DIAGNOSIS — N2581 Secondary hyperparathyroidism of renal origin: Secondary | ICD-10-CM | POA: Diagnosis not present

## 2018-05-14 DIAGNOSIS — Z992 Dependence on renal dialysis: Secondary | ICD-10-CM | POA: Diagnosis not present

## 2018-05-14 DIAGNOSIS — E1122 Type 2 diabetes mellitus with diabetic chronic kidney disease: Secondary | ICD-10-CM | POA: Diagnosis not present

## 2018-05-14 DIAGNOSIS — D631 Anemia in chronic kidney disease: Secondary | ICD-10-CM | POA: Diagnosis not present

## 2018-05-14 DIAGNOSIS — D689 Coagulation defect, unspecified: Secondary | ICD-10-CM | POA: Diagnosis not present

## 2018-05-15 DIAGNOSIS — G4733 Obstructive sleep apnea (adult) (pediatric): Secondary | ICD-10-CM | POA: Diagnosis not present

## 2018-05-17 DIAGNOSIS — D689 Coagulation defect, unspecified: Secondary | ICD-10-CM | POA: Diagnosis not present

## 2018-05-17 DIAGNOSIS — R52 Pain, unspecified: Secondary | ICD-10-CM | POA: Diagnosis not present

## 2018-05-17 DIAGNOSIS — Z992 Dependence on renal dialysis: Secondary | ICD-10-CM | POA: Diagnosis not present

## 2018-05-17 DIAGNOSIS — D631 Anemia in chronic kidney disease: Secondary | ICD-10-CM | POA: Diagnosis not present

## 2018-05-17 DIAGNOSIS — N186 End stage renal disease: Secondary | ICD-10-CM | POA: Diagnosis not present

## 2018-05-17 DIAGNOSIS — N2581 Secondary hyperparathyroidism of renal origin: Secondary | ICD-10-CM | POA: Diagnosis not present

## 2018-05-17 DIAGNOSIS — E1122 Type 2 diabetes mellitus with diabetic chronic kidney disease: Secondary | ICD-10-CM | POA: Diagnosis not present

## 2018-05-19 DIAGNOSIS — Z992 Dependence on renal dialysis: Secondary | ICD-10-CM | POA: Diagnosis not present

## 2018-05-19 DIAGNOSIS — R52 Pain, unspecified: Secondary | ICD-10-CM | POA: Diagnosis not present

## 2018-05-19 DIAGNOSIS — E1122 Type 2 diabetes mellitus with diabetic chronic kidney disease: Secondary | ICD-10-CM | POA: Diagnosis not present

## 2018-05-19 DIAGNOSIS — N186 End stage renal disease: Secondary | ICD-10-CM | POA: Diagnosis not present

## 2018-05-19 DIAGNOSIS — N2581 Secondary hyperparathyroidism of renal origin: Secondary | ICD-10-CM | POA: Diagnosis not present

## 2018-05-19 DIAGNOSIS — D631 Anemia in chronic kidney disease: Secondary | ICD-10-CM | POA: Diagnosis not present

## 2018-05-19 DIAGNOSIS — D689 Coagulation defect, unspecified: Secondary | ICD-10-CM | POA: Diagnosis not present

## 2018-05-21 DIAGNOSIS — E1122 Type 2 diabetes mellitus with diabetic chronic kidney disease: Secondary | ICD-10-CM | POA: Diagnosis not present

## 2018-05-21 DIAGNOSIS — D689 Coagulation defect, unspecified: Secondary | ICD-10-CM | POA: Diagnosis not present

## 2018-05-21 DIAGNOSIS — N2581 Secondary hyperparathyroidism of renal origin: Secondary | ICD-10-CM | POA: Diagnosis not present

## 2018-05-21 DIAGNOSIS — R52 Pain, unspecified: Secondary | ICD-10-CM | POA: Diagnosis not present

## 2018-05-21 DIAGNOSIS — N186 End stage renal disease: Secondary | ICD-10-CM | POA: Diagnosis not present

## 2018-05-21 DIAGNOSIS — Z992 Dependence on renal dialysis: Secondary | ICD-10-CM | POA: Diagnosis not present

## 2018-05-21 DIAGNOSIS — D631 Anemia in chronic kidney disease: Secondary | ICD-10-CM | POA: Diagnosis not present

## 2018-05-23 DIAGNOSIS — R52 Pain, unspecified: Secondary | ICD-10-CM | POA: Diagnosis not present

## 2018-05-23 DIAGNOSIS — Z992 Dependence on renal dialysis: Secondary | ICD-10-CM | POA: Diagnosis not present

## 2018-05-23 DIAGNOSIS — D631 Anemia in chronic kidney disease: Secondary | ICD-10-CM | POA: Diagnosis not present

## 2018-05-23 DIAGNOSIS — N186 End stage renal disease: Secondary | ICD-10-CM | POA: Diagnosis not present

## 2018-05-23 DIAGNOSIS — N2581 Secondary hyperparathyroidism of renal origin: Secondary | ICD-10-CM | POA: Diagnosis not present

## 2018-05-23 DIAGNOSIS — D689 Coagulation defect, unspecified: Secondary | ICD-10-CM | POA: Diagnosis not present

## 2018-05-23 DIAGNOSIS — E1122 Type 2 diabetes mellitus with diabetic chronic kidney disease: Secondary | ICD-10-CM | POA: Diagnosis not present

## 2018-05-25 DIAGNOSIS — R52 Pain, unspecified: Secondary | ICD-10-CM | POA: Diagnosis not present

## 2018-05-25 DIAGNOSIS — D689 Coagulation defect, unspecified: Secondary | ICD-10-CM | POA: Diagnosis not present

## 2018-05-25 DIAGNOSIS — Z992 Dependence on renal dialysis: Secondary | ICD-10-CM | POA: Diagnosis not present

## 2018-05-25 DIAGNOSIS — D631 Anemia in chronic kidney disease: Secondary | ICD-10-CM | POA: Diagnosis not present

## 2018-05-25 DIAGNOSIS — N2581 Secondary hyperparathyroidism of renal origin: Secondary | ICD-10-CM | POA: Diagnosis not present

## 2018-05-25 DIAGNOSIS — N186 End stage renal disease: Secondary | ICD-10-CM | POA: Diagnosis not present

## 2018-05-25 DIAGNOSIS — E1122 Type 2 diabetes mellitus with diabetic chronic kidney disease: Secondary | ICD-10-CM | POA: Diagnosis not present

## 2018-05-28 DIAGNOSIS — N2581 Secondary hyperparathyroidism of renal origin: Secondary | ICD-10-CM | POA: Diagnosis not present

## 2018-05-28 DIAGNOSIS — E1122 Type 2 diabetes mellitus with diabetic chronic kidney disease: Secondary | ICD-10-CM | POA: Diagnosis not present

## 2018-05-28 DIAGNOSIS — D689 Coagulation defect, unspecified: Secondary | ICD-10-CM | POA: Diagnosis not present

## 2018-05-28 DIAGNOSIS — D631 Anemia in chronic kidney disease: Secondary | ICD-10-CM | POA: Diagnosis not present

## 2018-05-28 DIAGNOSIS — R52 Pain, unspecified: Secondary | ICD-10-CM | POA: Diagnosis not present

## 2018-05-28 DIAGNOSIS — Z992 Dependence on renal dialysis: Secondary | ICD-10-CM | POA: Diagnosis not present

## 2018-05-28 DIAGNOSIS — N186 End stage renal disease: Secondary | ICD-10-CM | POA: Diagnosis not present

## 2018-05-30 DIAGNOSIS — D631 Anemia in chronic kidney disease: Secondary | ICD-10-CM | POA: Diagnosis not present

## 2018-05-30 DIAGNOSIS — Z992 Dependence on renal dialysis: Secondary | ICD-10-CM | POA: Diagnosis not present

## 2018-05-30 DIAGNOSIS — N2581 Secondary hyperparathyroidism of renal origin: Secondary | ICD-10-CM | POA: Diagnosis not present

## 2018-05-30 DIAGNOSIS — N186 End stage renal disease: Secondary | ICD-10-CM | POA: Diagnosis not present

## 2018-05-30 DIAGNOSIS — R52 Pain, unspecified: Secondary | ICD-10-CM | POA: Diagnosis not present

## 2018-05-30 DIAGNOSIS — E1122 Type 2 diabetes mellitus with diabetic chronic kidney disease: Secondary | ICD-10-CM | POA: Diagnosis not present

## 2018-05-30 DIAGNOSIS — D689 Coagulation defect, unspecified: Secondary | ICD-10-CM | POA: Diagnosis not present

## 2018-06-01 DIAGNOSIS — N186 End stage renal disease: Secondary | ICD-10-CM | POA: Diagnosis not present

## 2018-06-01 DIAGNOSIS — E1122 Type 2 diabetes mellitus with diabetic chronic kidney disease: Secondary | ICD-10-CM | POA: Diagnosis not present

## 2018-06-01 DIAGNOSIS — R52 Pain, unspecified: Secondary | ICD-10-CM | POA: Diagnosis not present

## 2018-06-01 DIAGNOSIS — Z992 Dependence on renal dialysis: Secondary | ICD-10-CM | POA: Diagnosis not present

## 2018-06-01 DIAGNOSIS — I129 Hypertensive chronic kidney disease with stage 1 through stage 4 chronic kidney disease, or unspecified chronic kidney disease: Secondary | ICD-10-CM | POA: Diagnosis not present

## 2018-06-01 DIAGNOSIS — N2581 Secondary hyperparathyroidism of renal origin: Secondary | ICD-10-CM | POA: Diagnosis not present

## 2018-06-01 DIAGNOSIS — D631 Anemia in chronic kidney disease: Secondary | ICD-10-CM | POA: Diagnosis not present

## 2018-06-01 DIAGNOSIS — D689 Coagulation defect, unspecified: Secondary | ICD-10-CM | POA: Diagnosis not present

## 2018-06-04 DIAGNOSIS — D689 Coagulation defect, unspecified: Secondary | ICD-10-CM | POA: Diagnosis not present

## 2018-06-04 DIAGNOSIS — N2581 Secondary hyperparathyroidism of renal origin: Secondary | ICD-10-CM | POA: Diagnosis not present

## 2018-06-04 DIAGNOSIS — E1122 Type 2 diabetes mellitus with diabetic chronic kidney disease: Secondary | ICD-10-CM | POA: Diagnosis not present

## 2018-06-04 DIAGNOSIS — N186 End stage renal disease: Secondary | ICD-10-CM | POA: Diagnosis not present

## 2018-06-07 DIAGNOSIS — D689 Coagulation defect, unspecified: Secondary | ICD-10-CM | POA: Diagnosis not present

## 2018-06-07 DIAGNOSIS — E1122 Type 2 diabetes mellitus with diabetic chronic kidney disease: Secondary | ICD-10-CM | POA: Diagnosis not present

## 2018-06-07 DIAGNOSIS — N2581 Secondary hyperparathyroidism of renal origin: Secondary | ICD-10-CM | POA: Diagnosis not present

## 2018-06-07 DIAGNOSIS — N186 End stage renal disease: Secondary | ICD-10-CM | POA: Diagnosis not present

## 2018-06-09 DIAGNOSIS — N2581 Secondary hyperparathyroidism of renal origin: Secondary | ICD-10-CM | POA: Diagnosis not present

## 2018-06-09 DIAGNOSIS — N186 End stage renal disease: Secondary | ICD-10-CM | POA: Diagnosis not present

## 2018-06-09 DIAGNOSIS — E1122 Type 2 diabetes mellitus with diabetic chronic kidney disease: Secondary | ICD-10-CM | POA: Diagnosis not present

## 2018-06-09 DIAGNOSIS — D689 Coagulation defect, unspecified: Secondary | ICD-10-CM | POA: Diagnosis not present

## 2018-06-11 DIAGNOSIS — N2581 Secondary hyperparathyroidism of renal origin: Secondary | ICD-10-CM | POA: Diagnosis not present

## 2018-06-11 DIAGNOSIS — D689 Coagulation defect, unspecified: Secondary | ICD-10-CM | POA: Diagnosis not present

## 2018-06-11 DIAGNOSIS — E1122 Type 2 diabetes mellitus with diabetic chronic kidney disease: Secondary | ICD-10-CM | POA: Diagnosis not present

## 2018-06-11 DIAGNOSIS — N186 End stage renal disease: Secondary | ICD-10-CM | POA: Diagnosis not present

## 2018-06-14 DIAGNOSIS — N186 End stage renal disease: Secondary | ICD-10-CM | POA: Diagnosis not present

## 2018-06-14 DIAGNOSIS — E1122 Type 2 diabetes mellitus with diabetic chronic kidney disease: Secondary | ICD-10-CM | POA: Diagnosis not present

## 2018-06-14 DIAGNOSIS — N2581 Secondary hyperparathyroidism of renal origin: Secondary | ICD-10-CM | POA: Diagnosis not present

## 2018-06-14 DIAGNOSIS — D689 Coagulation defect, unspecified: Secondary | ICD-10-CM | POA: Diagnosis not present

## 2018-06-16 DIAGNOSIS — N186 End stage renal disease: Secondary | ICD-10-CM | POA: Diagnosis not present

## 2018-06-16 DIAGNOSIS — D689 Coagulation defect, unspecified: Secondary | ICD-10-CM | POA: Diagnosis not present

## 2018-06-16 DIAGNOSIS — N2581 Secondary hyperparathyroidism of renal origin: Secondary | ICD-10-CM | POA: Diagnosis not present

## 2018-06-16 DIAGNOSIS — E1122 Type 2 diabetes mellitus with diabetic chronic kidney disease: Secondary | ICD-10-CM | POA: Diagnosis not present

## 2018-06-18 DIAGNOSIS — N2581 Secondary hyperparathyroidism of renal origin: Secondary | ICD-10-CM | POA: Diagnosis not present

## 2018-06-18 DIAGNOSIS — N186 End stage renal disease: Secondary | ICD-10-CM | POA: Diagnosis not present

## 2018-06-18 DIAGNOSIS — E1122 Type 2 diabetes mellitus with diabetic chronic kidney disease: Secondary | ICD-10-CM | POA: Diagnosis not present

## 2018-06-18 DIAGNOSIS — D689 Coagulation defect, unspecified: Secondary | ICD-10-CM | POA: Diagnosis not present

## 2018-06-21 DIAGNOSIS — N2581 Secondary hyperparathyroidism of renal origin: Secondary | ICD-10-CM | POA: Diagnosis not present

## 2018-06-21 DIAGNOSIS — D689 Coagulation defect, unspecified: Secondary | ICD-10-CM | POA: Diagnosis not present

## 2018-06-21 DIAGNOSIS — E1122 Type 2 diabetes mellitus with diabetic chronic kidney disease: Secondary | ICD-10-CM | POA: Diagnosis not present

## 2018-06-21 DIAGNOSIS — N186 End stage renal disease: Secondary | ICD-10-CM | POA: Diagnosis not present

## 2018-06-23 DIAGNOSIS — N186 End stage renal disease: Secondary | ICD-10-CM | POA: Diagnosis not present

## 2018-06-23 DIAGNOSIS — N2581 Secondary hyperparathyroidism of renal origin: Secondary | ICD-10-CM | POA: Diagnosis not present

## 2018-06-23 DIAGNOSIS — E1122 Type 2 diabetes mellitus with diabetic chronic kidney disease: Secondary | ICD-10-CM | POA: Diagnosis not present

## 2018-06-23 DIAGNOSIS — D689 Coagulation defect, unspecified: Secondary | ICD-10-CM | POA: Diagnosis not present

## 2018-06-25 DIAGNOSIS — D689 Coagulation defect, unspecified: Secondary | ICD-10-CM | POA: Diagnosis not present

## 2018-06-25 DIAGNOSIS — N2581 Secondary hyperparathyroidism of renal origin: Secondary | ICD-10-CM | POA: Diagnosis not present

## 2018-06-25 DIAGNOSIS — N186 End stage renal disease: Secondary | ICD-10-CM | POA: Diagnosis not present

## 2018-06-25 DIAGNOSIS — E1122 Type 2 diabetes mellitus with diabetic chronic kidney disease: Secondary | ICD-10-CM | POA: Diagnosis not present

## 2018-06-28 DIAGNOSIS — N2581 Secondary hyperparathyroidism of renal origin: Secondary | ICD-10-CM | POA: Diagnosis not present

## 2018-06-28 DIAGNOSIS — E1122 Type 2 diabetes mellitus with diabetic chronic kidney disease: Secondary | ICD-10-CM | POA: Diagnosis not present

## 2018-06-28 DIAGNOSIS — N186 End stage renal disease: Secondary | ICD-10-CM | POA: Diagnosis not present

## 2018-06-28 DIAGNOSIS — D689 Coagulation defect, unspecified: Secondary | ICD-10-CM | POA: Diagnosis not present

## 2018-06-30 DIAGNOSIS — N186 End stage renal disease: Secondary | ICD-10-CM | POA: Diagnosis not present

## 2018-06-30 DIAGNOSIS — E1122 Type 2 diabetes mellitus with diabetic chronic kidney disease: Secondary | ICD-10-CM | POA: Diagnosis not present

## 2018-06-30 DIAGNOSIS — D689 Coagulation defect, unspecified: Secondary | ICD-10-CM | POA: Diagnosis not present

## 2018-06-30 DIAGNOSIS — N2581 Secondary hyperparathyroidism of renal origin: Secondary | ICD-10-CM | POA: Diagnosis not present

## 2018-07-02 DIAGNOSIS — N2581 Secondary hyperparathyroidism of renal origin: Secondary | ICD-10-CM | POA: Diagnosis not present

## 2018-07-02 DIAGNOSIS — Z992 Dependence on renal dialysis: Secondary | ICD-10-CM | POA: Diagnosis not present

## 2018-07-02 DIAGNOSIS — D689 Coagulation defect, unspecified: Secondary | ICD-10-CM | POA: Diagnosis not present

## 2018-07-02 DIAGNOSIS — E1122 Type 2 diabetes mellitus with diabetic chronic kidney disease: Secondary | ICD-10-CM | POA: Diagnosis not present

## 2018-07-02 DIAGNOSIS — I129 Hypertensive chronic kidney disease with stage 1 through stage 4 chronic kidney disease, or unspecified chronic kidney disease: Secondary | ICD-10-CM | POA: Diagnosis not present

## 2018-07-02 DIAGNOSIS — N186 End stage renal disease: Secondary | ICD-10-CM | POA: Diagnosis not present

## 2018-07-05 DIAGNOSIS — D631 Anemia in chronic kidney disease: Secondary | ICD-10-CM | POA: Diagnosis not present

## 2018-07-05 DIAGNOSIS — N2581 Secondary hyperparathyroidism of renal origin: Secondary | ICD-10-CM | POA: Diagnosis not present

## 2018-07-05 DIAGNOSIS — N186 End stage renal disease: Secondary | ICD-10-CM | POA: Diagnosis not present

## 2018-07-05 DIAGNOSIS — D689 Coagulation defect, unspecified: Secondary | ICD-10-CM | POA: Diagnosis not present

## 2018-07-05 DIAGNOSIS — E1122 Type 2 diabetes mellitus with diabetic chronic kidney disease: Secondary | ICD-10-CM | POA: Diagnosis not present

## 2018-07-05 DIAGNOSIS — Z992 Dependence on renal dialysis: Secondary | ICD-10-CM | POA: Diagnosis not present

## 2018-07-06 DIAGNOSIS — E789 Disorder of lipoprotein metabolism, unspecified: Secondary | ICD-10-CM | POA: Diagnosis not present

## 2018-07-06 DIAGNOSIS — I10 Essential (primary) hypertension: Secondary | ICD-10-CM | POA: Diagnosis not present

## 2018-07-06 DIAGNOSIS — E118 Type 2 diabetes mellitus with unspecified complications: Secondary | ICD-10-CM | POA: Diagnosis not present

## 2018-07-06 DIAGNOSIS — R6889 Other general symptoms and signs: Secondary | ICD-10-CM | POA: Diagnosis not present

## 2018-07-07 DIAGNOSIS — N186 End stage renal disease: Secondary | ICD-10-CM | POA: Diagnosis not present

## 2018-07-07 DIAGNOSIS — N2581 Secondary hyperparathyroidism of renal origin: Secondary | ICD-10-CM | POA: Diagnosis not present

## 2018-07-07 DIAGNOSIS — D631 Anemia in chronic kidney disease: Secondary | ICD-10-CM | POA: Diagnosis not present

## 2018-07-07 DIAGNOSIS — Z992 Dependence on renal dialysis: Secondary | ICD-10-CM | POA: Diagnosis not present

## 2018-07-07 DIAGNOSIS — D689 Coagulation defect, unspecified: Secondary | ICD-10-CM | POA: Diagnosis not present

## 2018-07-07 DIAGNOSIS — E1122 Type 2 diabetes mellitus with diabetic chronic kidney disease: Secondary | ICD-10-CM | POA: Diagnosis not present

## 2018-07-09 DIAGNOSIS — D689 Coagulation defect, unspecified: Secondary | ICD-10-CM | POA: Diagnosis not present

## 2018-07-09 DIAGNOSIS — N186 End stage renal disease: Secondary | ICD-10-CM | POA: Diagnosis not present

## 2018-07-09 DIAGNOSIS — N2581 Secondary hyperparathyroidism of renal origin: Secondary | ICD-10-CM | POA: Diagnosis not present

## 2018-07-09 DIAGNOSIS — D631 Anemia in chronic kidney disease: Secondary | ICD-10-CM | POA: Diagnosis not present

## 2018-07-09 DIAGNOSIS — Z992 Dependence on renal dialysis: Secondary | ICD-10-CM | POA: Diagnosis not present

## 2018-07-09 DIAGNOSIS — E1122 Type 2 diabetes mellitus with diabetic chronic kidney disease: Secondary | ICD-10-CM | POA: Diagnosis not present

## 2018-07-12 DIAGNOSIS — D631 Anemia in chronic kidney disease: Secondary | ICD-10-CM | POA: Diagnosis not present

## 2018-07-12 DIAGNOSIS — Z992 Dependence on renal dialysis: Secondary | ICD-10-CM | POA: Diagnosis not present

## 2018-07-12 DIAGNOSIS — N2581 Secondary hyperparathyroidism of renal origin: Secondary | ICD-10-CM | POA: Diagnosis not present

## 2018-07-12 DIAGNOSIS — E1122 Type 2 diabetes mellitus with diabetic chronic kidney disease: Secondary | ICD-10-CM | POA: Diagnosis not present

## 2018-07-12 DIAGNOSIS — N186 End stage renal disease: Secondary | ICD-10-CM | POA: Diagnosis not present

## 2018-07-12 DIAGNOSIS — D689 Coagulation defect, unspecified: Secondary | ICD-10-CM | POA: Diagnosis not present

## 2018-07-14 DIAGNOSIS — Z992 Dependence on renal dialysis: Secondary | ICD-10-CM | POA: Diagnosis not present

## 2018-07-14 DIAGNOSIS — E1122 Type 2 diabetes mellitus with diabetic chronic kidney disease: Secondary | ICD-10-CM | POA: Diagnosis not present

## 2018-07-14 DIAGNOSIS — D631 Anemia in chronic kidney disease: Secondary | ICD-10-CM | POA: Diagnosis not present

## 2018-07-14 DIAGNOSIS — N2581 Secondary hyperparathyroidism of renal origin: Secondary | ICD-10-CM | POA: Diagnosis not present

## 2018-07-14 DIAGNOSIS — N186 End stage renal disease: Secondary | ICD-10-CM | POA: Diagnosis not present

## 2018-07-14 DIAGNOSIS — D689 Coagulation defect, unspecified: Secondary | ICD-10-CM | POA: Diagnosis not present

## 2018-07-16 DIAGNOSIS — N2581 Secondary hyperparathyroidism of renal origin: Secondary | ICD-10-CM | POA: Diagnosis not present

## 2018-07-16 DIAGNOSIS — D631 Anemia in chronic kidney disease: Secondary | ICD-10-CM | POA: Diagnosis not present

## 2018-07-16 DIAGNOSIS — N186 End stage renal disease: Secondary | ICD-10-CM | POA: Diagnosis not present

## 2018-07-16 DIAGNOSIS — Z992 Dependence on renal dialysis: Secondary | ICD-10-CM | POA: Diagnosis not present

## 2018-07-16 DIAGNOSIS — D689 Coagulation defect, unspecified: Secondary | ICD-10-CM | POA: Diagnosis not present

## 2018-07-16 DIAGNOSIS — E1122 Type 2 diabetes mellitus with diabetic chronic kidney disease: Secondary | ICD-10-CM | POA: Diagnosis not present

## 2018-07-19 DIAGNOSIS — Z992 Dependence on renal dialysis: Secondary | ICD-10-CM | POA: Diagnosis not present

## 2018-07-19 DIAGNOSIS — D631 Anemia in chronic kidney disease: Secondary | ICD-10-CM | POA: Diagnosis not present

## 2018-07-19 DIAGNOSIS — E1122 Type 2 diabetes mellitus with diabetic chronic kidney disease: Secondary | ICD-10-CM | POA: Diagnosis not present

## 2018-07-19 DIAGNOSIS — N186 End stage renal disease: Secondary | ICD-10-CM | POA: Diagnosis not present

## 2018-07-19 DIAGNOSIS — D689 Coagulation defect, unspecified: Secondary | ICD-10-CM | POA: Diagnosis not present

## 2018-07-19 DIAGNOSIS — N2581 Secondary hyperparathyroidism of renal origin: Secondary | ICD-10-CM | POA: Diagnosis not present

## 2018-07-21 DIAGNOSIS — D689 Coagulation defect, unspecified: Secondary | ICD-10-CM | POA: Diagnosis not present

## 2018-07-21 DIAGNOSIS — E1122 Type 2 diabetes mellitus with diabetic chronic kidney disease: Secondary | ICD-10-CM | POA: Diagnosis not present

## 2018-07-21 DIAGNOSIS — N186 End stage renal disease: Secondary | ICD-10-CM | POA: Diagnosis not present

## 2018-07-21 DIAGNOSIS — Z992 Dependence on renal dialysis: Secondary | ICD-10-CM | POA: Diagnosis not present

## 2018-07-21 DIAGNOSIS — D631 Anemia in chronic kidney disease: Secondary | ICD-10-CM | POA: Diagnosis not present

## 2018-07-21 DIAGNOSIS — N2581 Secondary hyperparathyroidism of renal origin: Secondary | ICD-10-CM | POA: Diagnosis not present

## 2018-07-23 DIAGNOSIS — E1122 Type 2 diabetes mellitus with diabetic chronic kidney disease: Secondary | ICD-10-CM | POA: Diagnosis not present

## 2018-07-23 DIAGNOSIS — Z992 Dependence on renal dialysis: Secondary | ICD-10-CM | POA: Diagnosis not present

## 2018-07-23 DIAGNOSIS — D631 Anemia in chronic kidney disease: Secondary | ICD-10-CM | POA: Diagnosis not present

## 2018-07-23 DIAGNOSIS — N186 End stage renal disease: Secondary | ICD-10-CM | POA: Diagnosis not present

## 2018-07-23 DIAGNOSIS — N2581 Secondary hyperparathyroidism of renal origin: Secondary | ICD-10-CM | POA: Diagnosis not present

## 2018-07-23 DIAGNOSIS — D689 Coagulation defect, unspecified: Secondary | ICD-10-CM | POA: Diagnosis not present

## 2018-07-26 DIAGNOSIS — D631 Anemia in chronic kidney disease: Secondary | ICD-10-CM | POA: Diagnosis not present

## 2018-07-26 DIAGNOSIS — N2581 Secondary hyperparathyroidism of renal origin: Secondary | ICD-10-CM | POA: Diagnosis not present

## 2018-07-26 DIAGNOSIS — E1122 Type 2 diabetes mellitus with diabetic chronic kidney disease: Secondary | ICD-10-CM | POA: Diagnosis not present

## 2018-07-26 DIAGNOSIS — D689 Coagulation defect, unspecified: Secondary | ICD-10-CM | POA: Diagnosis not present

## 2018-07-26 DIAGNOSIS — Z992 Dependence on renal dialysis: Secondary | ICD-10-CM | POA: Diagnosis not present

## 2018-07-26 DIAGNOSIS — N186 End stage renal disease: Secondary | ICD-10-CM | POA: Diagnosis not present

## 2018-07-28 DIAGNOSIS — E1122 Type 2 diabetes mellitus with diabetic chronic kidney disease: Secondary | ICD-10-CM | POA: Diagnosis not present

## 2018-07-28 DIAGNOSIS — D631 Anemia in chronic kidney disease: Secondary | ICD-10-CM | POA: Diagnosis not present

## 2018-07-28 DIAGNOSIS — N2581 Secondary hyperparathyroidism of renal origin: Secondary | ICD-10-CM | POA: Diagnosis not present

## 2018-07-28 DIAGNOSIS — Z992 Dependence on renal dialysis: Secondary | ICD-10-CM | POA: Diagnosis not present

## 2018-07-28 DIAGNOSIS — N186 End stage renal disease: Secondary | ICD-10-CM | POA: Diagnosis not present

## 2018-07-28 DIAGNOSIS — D689 Coagulation defect, unspecified: Secondary | ICD-10-CM | POA: Diagnosis not present

## 2018-07-30 DIAGNOSIS — Z992 Dependence on renal dialysis: Secondary | ICD-10-CM | POA: Diagnosis not present

## 2018-07-30 DIAGNOSIS — D689 Coagulation defect, unspecified: Secondary | ICD-10-CM | POA: Diagnosis not present

## 2018-07-30 DIAGNOSIS — N186 End stage renal disease: Secondary | ICD-10-CM | POA: Diagnosis not present

## 2018-07-30 DIAGNOSIS — E1122 Type 2 diabetes mellitus with diabetic chronic kidney disease: Secondary | ICD-10-CM | POA: Diagnosis not present

## 2018-07-30 DIAGNOSIS — N2581 Secondary hyperparathyroidism of renal origin: Secondary | ICD-10-CM | POA: Diagnosis not present

## 2018-07-30 DIAGNOSIS — D631 Anemia in chronic kidney disease: Secondary | ICD-10-CM | POA: Diagnosis not present

## 2018-07-31 DIAGNOSIS — I129 Hypertensive chronic kidney disease with stage 1 through stage 4 chronic kidney disease, or unspecified chronic kidney disease: Secondary | ICD-10-CM | POA: Diagnosis not present

## 2018-07-31 DIAGNOSIS — N186 End stage renal disease: Secondary | ICD-10-CM | POA: Diagnosis not present

## 2018-07-31 DIAGNOSIS — Z992 Dependence on renal dialysis: Secondary | ICD-10-CM | POA: Diagnosis not present

## 2018-08-02 DIAGNOSIS — E1122 Type 2 diabetes mellitus with diabetic chronic kidney disease: Secondary | ICD-10-CM | POA: Diagnosis not present

## 2018-08-02 DIAGNOSIS — D689 Coagulation defect, unspecified: Secondary | ICD-10-CM | POA: Diagnosis not present

## 2018-08-02 DIAGNOSIS — Z992 Dependence on renal dialysis: Secondary | ICD-10-CM | POA: Diagnosis not present

## 2018-08-02 DIAGNOSIS — N2581 Secondary hyperparathyroidism of renal origin: Secondary | ICD-10-CM | POA: Diagnosis not present

## 2018-08-02 DIAGNOSIS — N186 End stage renal disease: Secondary | ICD-10-CM | POA: Diagnosis not present

## 2018-08-02 DIAGNOSIS — D631 Anemia in chronic kidney disease: Secondary | ICD-10-CM | POA: Diagnosis not present

## 2018-08-03 DIAGNOSIS — R6889 Other general symptoms and signs: Secondary | ICD-10-CM | POA: Diagnosis not present

## 2018-08-03 DIAGNOSIS — E789 Disorder of lipoprotein metabolism, unspecified: Secondary | ICD-10-CM | POA: Diagnosis not present

## 2018-08-03 DIAGNOSIS — Z992 Dependence on renal dialysis: Secondary | ICD-10-CM | POA: Diagnosis not present

## 2018-08-03 DIAGNOSIS — Z0000 Encounter for general adult medical examination without abnormal findings: Secondary | ICD-10-CM | POA: Diagnosis not present

## 2018-08-03 DIAGNOSIS — I10 Essential (primary) hypertension: Secondary | ICD-10-CM | POA: Diagnosis not present

## 2018-08-04 DIAGNOSIS — N186 End stage renal disease: Secondary | ICD-10-CM | POA: Diagnosis not present

## 2018-08-04 DIAGNOSIS — D689 Coagulation defect, unspecified: Secondary | ICD-10-CM | POA: Diagnosis not present

## 2018-08-04 DIAGNOSIS — N2581 Secondary hyperparathyroidism of renal origin: Secondary | ICD-10-CM | POA: Diagnosis not present

## 2018-08-04 DIAGNOSIS — Z992 Dependence on renal dialysis: Secondary | ICD-10-CM | POA: Diagnosis not present

## 2018-08-04 DIAGNOSIS — E1122 Type 2 diabetes mellitus with diabetic chronic kidney disease: Secondary | ICD-10-CM | POA: Diagnosis not present

## 2018-08-04 DIAGNOSIS — D631 Anemia in chronic kidney disease: Secondary | ICD-10-CM | POA: Diagnosis not present

## 2018-08-06 DIAGNOSIS — N186 End stage renal disease: Secondary | ICD-10-CM | POA: Diagnosis not present

## 2018-08-06 DIAGNOSIS — N2581 Secondary hyperparathyroidism of renal origin: Secondary | ICD-10-CM | POA: Diagnosis not present

## 2018-08-06 DIAGNOSIS — D631 Anemia in chronic kidney disease: Secondary | ICD-10-CM | POA: Diagnosis not present

## 2018-08-06 DIAGNOSIS — E1122 Type 2 diabetes mellitus with diabetic chronic kidney disease: Secondary | ICD-10-CM | POA: Diagnosis not present

## 2018-08-06 DIAGNOSIS — D689 Coagulation defect, unspecified: Secondary | ICD-10-CM | POA: Diagnosis not present

## 2018-08-06 DIAGNOSIS — Z992 Dependence on renal dialysis: Secondary | ICD-10-CM | POA: Diagnosis not present

## 2018-08-09 DIAGNOSIS — Z992 Dependence on renal dialysis: Secondary | ICD-10-CM | POA: Diagnosis not present

## 2018-08-09 DIAGNOSIS — E1122 Type 2 diabetes mellitus with diabetic chronic kidney disease: Secondary | ICD-10-CM | POA: Diagnosis not present

## 2018-08-09 DIAGNOSIS — D689 Coagulation defect, unspecified: Secondary | ICD-10-CM | POA: Diagnosis not present

## 2018-08-09 DIAGNOSIS — D631 Anemia in chronic kidney disease: Secondary | ICD-10-CM | POA: Diagnosis not present

## 2018-08-09 DIAGNOSIS — N186 End stage renal disease: Secondary | ICD-10-CM | POA: Diagnosis not present

## 2018-08-09 DIAGNOSIS — N2581 Secondary hyperparathyroidism of renal origin: Secondary | ICD-10-CM | POA: Diagnosis not present

## 2018-08-11 DIAGNOSIS — D631 Anemia in chronic kidney disease: Secondary | ICD-10-CM | POA: Diagnosis not present

## 2018-08-11 DIAGNOSIS — N2581 Secondary hyperparathyroidism of renal origin: Secondary | ICD-10-CM | POA: Diagnosis not present

## 2018-08-11 DIAGNOSIS — N186 End stage renal disease: Secondary | ICD-10-CM | POA: Diagnosis not present

## 2018-08-11 DIAGNOSIS — D689 Coagulation defect, unspecified: Secondary | ICD-10-CM | POA: Diagnosis not present

## 2018-08-11 DIAGNOSIS — E1122 Type 2 diabetes mellitus with diabetic chronic kidney disease: Secondary | ICD-10-CM | POA: Diagnosis not present

## 2018-08-11 DIAGNOSIS — Z992 Dependence on renal dialysis: Secondary | ICD-10-CM | POA: Diagnosis not present

## 2018-08-13 DIAGNOSIS — N2581 Secondary hyperparathyroidism of renal origin: Secondary | ICD-10-CM | POA: Diagnosis not present

## 2018-08-13 DIAGNOSIS — D631 Anemia in chronic kidney disease: Secondary | ICD-10-CM | POA: Diagnosis not present

## 2018-08-13 DIAGNOSIS — Z992 Dependence on renal dialysis: Secondary | ICD-10-CM | POA: Diagnosis not present

## 2018-08-13 DIAGNOSIS — D689 Coagulation defect, unspecified: Secondary | ICD-10-CM | POA: Diagnosis not present

## 2018-08-13 DIAGNOSIS — N186 End stage renal disease: Secondary | ICD-10-CM | POA: Diagnosis not present

## 2018-08-13 DIAGNOSIS — E1122 Type 2 diabetes mellitus with diabetic chronic kidney disease: Secondary | ICD-10-CM | POA: Diagnosis not present

## 2018-08-16 DIAGNOSIS — Z992 Dependence on renal dialysis: Secondary | ICD-10-CM | POA: Diagnosis not present

## 2018-08-16 DIAGNOSIS — N2581 Secondary hyperparathyroidism of renal origin: Secondary | ICD-10-CM | POA: Diagnosis not present

## 2018-08-16 DIAGNOSIS — E1122 Type 2 diabetes mellitus with diabetic chronic kidney disease: Secondary | ICD-10-CM | POA: Diagnosis not present

## 2018-08-16 DIAGNOSIS — D631 Anemia in chronic kidney disease: Secondary | ICD-10-CM | POA: Diagnosis not present

## 2018-08-16 DIAGNOSIS — N186 End stage renal disease: Secondary | ICD-10-CM | POA: Diagnosis not present

## 2018-08-16 DIAGNOSIS — D689 Coagulation defect, unspecified: Secondary | ICD-10-CM | POA: Diagnosis not present

## 2018-08-18 DIAGNOSIS — N2581 Secondary hyperparathyroidism of renal origin: Secondary | ICD-10-CM | POA: Diagnosis not present

## 2018-08-18 DIAGNOSIS — D689 Coagulation defect, unspecified: Secondary | ICD-10-CM | POA: Diagnosis not present

## 2018-08-18 DIAGNOSIS — D631 Anemia in chronic kidney disease: Secondary | ICD-10-CM | POA: Diagnosis not present

## 2018-08-18 DIAGNOSIS — N186 End stage renal disease: Secondary | ICD-10-CM | POA: Diagnosis not present

## 2018-08-18 DIAGNOSIS — Z992 Dependence on renal dialysis: Secondary | ICD-10-CM | POA: Diagnosis not present

## 2018-08-18 DIAGNOSIS — E1122 Type 2 diabetes mellitus with diabetic chronic kidney disease: Secondary | ICD-10-CM | POA: Diagnosis not present

## 2018-08-20 DIAGNOSIS — N2581 Secondary hyperparathyroidism of renal origin: Secondary | ICD-10-CM | POA: Diagnosis not present

## 2018-08-20 DIAGNOSIS — N186 End stage renal disease: Secondary | ICD-10-CM | POA: Diagnosis not present

## 2018-08-20 DIAGNOSIS — E1122 Type 2 diabetes mellitus with diabetic chronic kidney disease: Secondary | ICD-10-CM | POA: Diagnosis not present

## 2018-08-20 DIAGNOSIS — D631 Anemia in chronic kidney disease: Secondary | ICD-10-CM | POA: Diagnosis not present

## 2018-08-20 DIAGNOSIS — Z992 Dependence on renal dialysis: Secondary | ICD-10-CM | POA: Diagnosis not present

## 2018-08-20 DIAGNOSIS — D689 Coagulation defect, unspecified: Secondary | ICD-10-CM | POA: Diagnosis not present

## 2018-08-23 ENCOUNTER — Telehealth: Payer: Self-pay

## 2018-08-23 DIAGNOSIS — D631 Anemia in chronic kidney disease: Secondary | ICD-10-CM | POA: Diagnosis not present

## 2018-08-23 DIAGNOSIS — Z992 Dependence on renal dialysis: Secondary | ICD-10-CM | POA: Diagnosis not present

## 2018-08-23 DIAGNOSIS — E1122 Type 2 diabetes mellitus with diabetic chronic kidney disease: Secondary | ICD-10-CM | POA: Diagnosis not present

## 2018-08-23 DIAGNOSIS — N186 End stage renal disease: Secondary | ICD-10-CM | POA: Diagnosis not present

## 2018-08-23 DIAGNOSIS — N2581 Secondary hyperparathyroidism of renal origin: Secondary | ICD-10-CM | POA: Diagnosis not present

## 2018-08-23 DIAGNOSIS — D689 Coagulation defect, unspecified: Secondary | ICD-10-CM | POA: Diagnosis not present

## 2018-08-23 NOTE — Telephone Encounter (Signed)
   Cardiac Questionnaire:    Since your last visit or hospitalization:    1. Have you been having new or worsening chest pain? No   2. Have you been having new or worsening shortness of breath? No 3. Have you been having new or worsening leg swelling, wt gain, or increase in abdominal girth (pants fitting more tightly)? No   4. Have you had any passing out spells? No      Primary Cardiologist:  Dr.Kelly  Patient contacted.  History reviewed.  No symptoms to suggest any unstable cardiac conditions.  Based on discussion, with current pandemic situation, we will be postponing this appointment for [facility].  If symptoms change, she has been instructed to contact our office.   Routing to C19 CANCEL pool for tracking (P CV DIV CV19 CANCEL) and assigning priority (1 = 4-6 wks, 2 = 6-12 wks, 3 = >12 wks).  Priority 3  Ena Dawley, LPN  3/76/2831 5:17 PM         .

## 2018-08-24 DIAGNOSIS — Z992 Dependence on renal dialysis: Secondary | ICD-10-CM | POA: Diagnosis not present

## 2018-08-24 DIAGNOSIS — E1122 Type 2 diabetes mellitus with diabetic chronic kidney disease: Secondary | ICD-10-CM | POA: Diagnosis not present

## 2018-08-24 DIAGNOSIS — N186 End stage renal disease: Secondary | ICD-10-CM | POA: Diagnosis not present

## 2018-08-24 DIAGNOSIS — D689 Coagulation defect, unspecified: Secondary | ICD-10-CM | POA: Diagnosis not present

## 2018-08-24 DIAGNOSIS — D631 Anemia in chronic kidney disease: Secondary | ICD-10-CM | POA: Diagnosis not present

## 2018-08-24 DIAGNOSIS — N2581 Secondary hyperparathyroidism of renal origin: Secondary | ICD-10-CM | POA: Diagnosis not present

## 2018-08-25 DIAGNOSIS — N2581 Secondary hyperparathyroidism of renal origin: Secondary | ICD-10-CM | POA: Diagnosis not present

## 2018-08-25 DIAGNOSIS — E1122 Type 2 diabetes mellitus with diabetic chronic kidney disease: Secondary | ICD-10-CM | POA: Diagnosis not present

## 2018-08-25 DIAGNOSIS — N186 End stage renal disease: Secondary | ICD-10-CM | POA: Diagnosis not present

## 2018-08-25 DIAGNOSIS — D631 Anemia in chronic kidney disease: Secondary | ICD-10-CM | POA: Diagnosis not present

## 2018-08-25 DIAGNOSIS — D689 Coagulation defect, unspecified: Secondary | ICD-10-CM | POA: Diagnosis not present

## 2018-08-25 DIAGNOSIS — Z992 Dependence on renal dialysis: Secondary | ICD-10-CM | POA: Diagnosis not present

## 2018-08-27 DIAGNOSIS — D689 Coagulation defect, unspecified: Secondary | ICD-10-CM | POA: Diagnosis not present

## 2018-08-27 DIAGNOSIS — Z992 Dependence on renal dialysis: Secondary | ICD-10-CM | POA: Diagnosis not present

## 2018-08-27 DIAGNOSIS — N186 End stage renal disease: Secondary | ICD-10-CM | POA: Diagnosis not present

## 2018-08-27 DIAGNOSIS — E1122 Type 2 diabetes mellitus with diabetic chronic kidney disease: Secondary | ICD-10-CM | POA: Diagnosis not present

## 2018-08-27 DIAGNOSIS — D631 Anemia in chronic kidney disease: Secondary | ICD-10-CM | POA: Diagnosis not present

## 2018-08-27 DIAGNOSIS — N2581 Secondary hyperparathyroidism of renal origin: Secondary | ICD-10-CM | POA: Diagnosis not present

## 2018-08-30 DIAGNOSIS — N2581 Secondary hyperparathyroidism of renal origin: Secondary | ICD-10-CM | POA: Diagnosis not present

## 2018-08-30 DIAGNOSIS — N186 End stage renal disease: Secondary | ICD-10-CM | POA: Diagnosis not present

## 2018-08-30 DIAGNOSIS — Z992 Dependence on renal dialysis: Secondary | ICD-10-CM | POA: Diagnosis not present

## 2018-08-30 DIAGNOSIS — E1122 Type 2 diabetes mellitus with diabetic chronic kidney disease: Secondary | ICD-10-CM | POA: Diagnosis not present

## 2018-08-30 DIAGNOSIS — D631 Anemia in chronic kidney disease: Secondary | ICD-10-CM | POA: Diagnosis not present

## 2018-08-30 DIAGNOSIS — D689 Coagulation defect, unspecified: Secondary | ICD-10-CM | POA: Diagnosis not present

## 2018-08-31 DIAGNOSIS — Z992 Dependence on renal dialysis: Secondary | ICD-10-CM | POA: Diagnosis not present

## 2018-08-31 DIAGNOSIS — I129 Hypertensive chronic kidney disease with stage 1 through stage 4 chronic kidney disease, or unspecified chronic kidney disease: Secondary | ICD-10-CM | POA: Diagnosis not present

## 2018-08-31 DIAGNOSIS — N186 End stage renal disease: Secondary | ICD-10-CM | POA: Diagnosis not present

## 2018-09-01 DIAGNOSIS — E1122 Type 2 diabetes mellitus with diabetic chronic kidney disease: Secondary | ICD-10-CM | POA: Diagnosis not present

## 2018-09-01 DIAGNOSIS — N2581 Secondary hyperparathyroidism of renal origin: Secondary | ICD-10-CM | POA: Diagnosis not present

## 2018-09-01 DIAGNOSIS — D631 Anemia in chronic kidney disease: Secondary | ICD-10-CM | POA: Diagnosis not present

## 2018-09-01 DIAGNOSIS — Z992 Dependence on renal dialysis: Secondary | ICD-10-CM | POA: Diagnosis not present

## 2018-09-01 DIAGNOSIS — D689 Coagulation defect, unspecified: Secondary | ICD-10-CM | POA: Diagnosis not present

## 2018-09-01 DIAGNOSIS — N186 End stage renal disease: Secondary | ICD-10-CM | POA: Diagnosis not present

## 2018-09-03 DIAGNOSIS — E1122 Type 2 diabetes mellitus with diabetic chronic kidney disease: Secondary | ICD-10-CM | POA: Diagnosis not present

## 2018-09-03 DIAGNOSIS — D631 Anemia in chronic kidney disease: Secondary | ICD-10-CM | POA: Diagnosis not present

## 2018-09-03 DIAGNOSIS — N186 End stage renal disease: Secondary | ICD-10-CM | POA: Diagnosis not present

## 2018-09-03 DIAGNOSIS — Z992 Dependence on renal dialysis: Secondary | ICD-10-CM | POA: Diagnosis not present

## 2018-09-03 DIAGNOSIS — D689 Coagulation defect, unspecified: Secondary | ICD-10-CM | POA: Diagnosis not present

## 2018-09-03 DIAGNOSIS — N2581 Secondary hyperparathyroidism of renal origin: Secondary | ICD-10-CM | POA: Diagnosis not present

## 2018-09-06 DIAGNOSIS — N2581 Secondary hyperparathyroidism of renal origin: Secondary | ICD-10-CM | POA: Diagnosis not present

## 2018-09-06 DIAGNOSIS — D631 Anemia in chronic kidney disease: Secondary | ICD-10-CM | POA: Diagnosis not present

## 2018-09-06 DIAGNOSIS — D689 Coagulation defect, unspecified: Secondary | ICD-10-CM | POA: Diagnosis not present

## 2018-09-06 DIAGNOSIS — Z992 Dependence on renal dialysis: Secondary | ICD-10-CM | POA: Diagnosis not present

## 2018-09-06 DIAGNOSIS — N186 End stage renal disease: Secondary | ICD-10-CM | POA: Diagnosis not present

## 2018-09-06 DIAGNOSIS — E1122 Type 2 diabetes mellitus with diabetic chronic kidney disease: Secondary | ICD-10-CM | POA: Diagnosis not present

## 2018-09-08 DIAGNOSIS — N2581 Secondary hyperparathyroidism of renal origin: Secondary | ICD-10-CM | POA: Diagnosis not present

## 2018-09-08 DIAGNOSIS — Z992 Dependence on renal dialysis: Secondary | ICD-10-CM | POA: Diagnosis not present

## 2018-09-08 DIAGNOSIS — E1122 Type 2 diabetes mellitus with diabetic chronic kidney disease: Secondary | ICD-10-CM | POA: Diagnosis not present

## 2018-09-08 DIAGNOSIS — D689 Coagulation defect, unspecified: Secondary | ICD-10-CM | POA: Diagnosis not present

## 2018-09-08 DIAGNOSIS — D631 Anemia in chronic kidney disease: Secondary | ICD-10-CM | POA: Diagnosis not present

## 2018-09-08 DIAGNOSIS — N186 End stage renal disease: Secondary | ICD-10-CM | POA: Diagnosis not present

## 2018-09-10 DIAGNOSIS — D631 Anemia in chronic kidney disease: Secondary | ICD-10-CM | POA: Diagnosis not present

## 2018-09-10 DIAGNOSIS — N2581 Secondary hyperparathyroidism of renal origin: Secondary | ICD-10-CM | POA: Diagnosis not present

## 2018-09-10 DIAGNOSIS — Z992 Dependence on renal dialysis: Secondary | ICD-10-CM | POA: Diagnosis not present

## 2018-09-10 DIAGNOSIS — N186 End stage renal disease: Secondary | ICD-10-CM | POA: Diagnosis not present

## 2018-09-10 DIAGNOSIS — D689 Coagulation defect, unspecified: Secondary | ICD-10-CM | POA: Diagnosis not present

## 2018-09-10 DIAGNOSIS — E1122 Type 2 diabetes mellitus with diabetic chronic kidney disease: Secondary | ICD-10-CM | POA: Diagnosis not present

## 2018-09-13 DIAGNOSIS — N186 End stage renal disease: Secondary | ICD-10-CM | POA: Diagnosis not present

## 2018-09-13 DIAGNOSIS — D689 Coagulation defect, unspecified: Secondary | ICD-10-CM | POA: Diagnosis not present

## 2018-09-13 DIAGNOSIS — N2581 Secondary hyperparathyroidism of renal origin: Secondary | ICD-10-CM | POA: Diagnosis not present

## 2018-09-13 DIAGNOSIS — D631 Anemia in chronic kidney disease: Secondary | ICD-10-CM | POA: Diagnosis not present

## 2018-09-13 DIAGNOSIS — E1122 Type 2 diabetes mellitus with diabetic chronic kidney disease: Secondary | ICD-10-CM | POA: Diagnosis not present

## 2018-09-13 DIAGNOSIS — Z992 Dependence on renal dialysis: Secondary | ICD-10-CM | POA: Diagnosis not present

## 2018-09-15 DIAGNOSIS — D689 Coagulation defect, unspecified: Secondary | ICD-10-CM | POA: Diagnosis not present

## 2018-09-15 DIAGNOSIS — D631 Anemia in chronic kidney disease: Secondary | ICD-10-CM | POA: Diagnosis not present

## 2018-09-15 DIAGNOSIS — N2581 Secondary hyperparathyroidism of renal origin: Secondary | ICD-10-CM | POA: Diagnosis not present

## 2018-09-15 DIAGNOSIS — Z992 Dependence on renal dialysis: Secondary | ICD-10-CM | POA: Diagnosis not present

## 2018-09-15 DIAGNOSIS — N186 End stage renal disease: Secondary | ICD-10-CM | POA: Diagnosis not present

## 2018-09-15 DIAGNOSIS — E1122 Type 2 diabetes mellitus with diabetic chronic kidney disease: Secondary | ICD-10-CM | POA: Diagnosis not present

## 2018-09-17 DIAGNOSIS — D631 Anemia in chronic kidney disease: Secondary | ICD-10-CM | POA: Diagnosis not present

## 2018-09-17 DIAGNOSIS — Z992 Dependence on renal dialysis: Secondary | ICD-10-CM | POA: Diagnosis not present

## 2018-09-17 DIAGNOSIS — E1122 Type 2 diabetes mellitus with diabetic chronic kidney disease: Secondary | ICD-10-CM | POA: Diagnosis not present

## 2018-09-17 DIAGNOSIS — N2581 Secondary hyperparathyroidism of renal origin: Secondary | ICD-10-CM | POA: Diagnosis not present

## 2018-09-17 DIAGNOSIS — D689 Coagulation defect, unspecified: Secondary | ICD-10-CM | POA: Diagnosis not present

## 2018-09-17 DIAGNOSIS — N186 End stage renal disease: Secondary | ICD-10-CM | POA: Diagnosis not present

## 2018-09-20 DIAGNOSIS — D631 Anemia in chronic kidney disease: Secondary | ICD-10-CM | POA: Diagnosis not present

## 2018-09-20 DIAGNOSIS — E1122 Type 2 diabetes mellitus with diabetic chronic kidney disease: Secondary | ICD-10-CM | POA: Diagnosis not present

## 2018-09-20 DIAGNOSIS — Z992 Dependence on renal dialysis: Secondary | ICD-10-CM | POA: Diagnosis not present

## 2018-09-20 DIAGNOSIS — D689 Coagulation defect, unspecified: Secondary | ICD-10-CM | POA: Diagnosis not present

## 2018-09-20 DIAGNOSIS — N2581 Secondary hyperparathyroidism of renal origin: Secondary | ICD-10-CM | POA: Diagnosis not present

## 2018-09-20 DIAGNOSIS — N186 End stage renal disease: Secondary | ICD-10-CM | POA: Diagnosis not present

## 2018-09-22 DIAGNOSIS — E1122 Type 2 diabetes mellitus with diabetic chronic kidney disease: Secondary | ICD-10-CM | POA: Diagnosis not present

## 2018-09-22 DIAGNOSIS — N2581 Secondary hyperparathyroidism of renal origin: Secondary | ICD-10-CM | POA: Diagnosis not present

## 2018-09-22 DIAGNOSIS — N186 End stage renal disease: Secondary | ICD-10-CM | POA: Diagnosis not present

## 2018-09-22 DIAGNOSIS — D631 Anemia in chronic kidney disease: Secondary | ICD-10-CM | POA: Diagnosis not present

## 2018-09-22 DIAGNOSIS — D689 Coagulation defect, unspecified: Secondary | ICD-10-CM | POA: Diagnosis not present

## 2018-09-22 DIAGNOSIS — Z992 Dependence on renal dialysis: Secondary | ICD-10-CM | POA: Diagnosis not present

## 2018-09-24 DIAGNOSIS — N2581 Secondary hyperparathyroidism of renal origin: Secondary | ICD-10-CM | POA: Diagnosis not present

## 2018-09-24 DIAGNOSIS — Z992 Dependence on renal dialysis: Secondary | ICD-10-CM | POA: Diagnosis not present

## 2018-09-24 DIAGNOSIS — D689 Coagulation defect, unspecified: Secondary | ICD-10-CM | POA: Diagnosis not present

## 2018-09-24 DIAGNOSIS — N186 End stage renal disease: Secondary | ICD-10-CM | POA: Diagnosis not present

## 2018-09-24 DIAGNOSIS — E1122 Type 2 diabetes mellitus with diabetic chronic kidney disease: Secondary | ICD-10-CM | POA: Diagnosis not present

## 2018-09-24 DIAGNOSIS — D631 Anemia in chronic kidney disease: Secondary | ICD-10-CM | POA: Diagnosis not present

## 2018-09-27 DIAGNOSIS — E1122 Type 2 diabetes mellitus with diabetic chronic kidney disease: Secondary | ICD-10-CM | POA: Diagnosis not present

## 2018-09-27 DIAGNOSIS — D689 Coagulation defect, unspecified: Secondary | ICD-10-CM | POA: Diagnosis not present

## 2018-09-27 DIAGNOSIS — D631 Anemia in chronic kidney disease: Secondary | ICD-10-CM | POA: Diagnosis not present

## 2018-09-27 DIAGNOSIS — N186 End stage renal disease: Secondary | ICD-10-CM | POA: Diagnosis not present

## 2018-09-27 DIAGNOSIS — N2581 Secondary hyperparathyroidism of renal origin: Secondary | ICD-10-CM | POA: Diagnosis not present

## 2018-09-27 DIAGNOSIS — Z992 Dependence on renal dialysis: Secondary | ICD-10-CM | POA: Diagnosis not present

## 2018-09-29 DIAGNOSIS — Z992 Dependence on renal dialysis: Secondary | ICD-10-CM | POA: Diagnosis not present

## 2018-09-29 DIAGNOSIS — D631 Anemia in chronic kidney disease: Secondary | ICD-10-CM | POA: Diagnosis not present

## 2018-09-29 DIAGNOSIS — N186 End stage renal disease: Secondary | ICD-10-CM | POA: Diagnosis not present

## 2018-09-29 DIAGNOSIS — D689 Coagulation defect, unspecified: Secondary | ICD-10-CM | POA: Diagnosis not present

## 2018-09-29 DIAGNOSIS — N2581 Secondary hyperparathyroidism of renal origin: Secondary | ICD-10-CM | POA: Diagnosis not present

## 2018-09-29 DIAGNOSIS — E1122 Type 2 diabetes mellitus with diabetic chronic kidney disease: Secondary | ICD-10-CM | POA: Diagnosis not present

## 2018-09-30 DIAGNOSIS — N186 End stage renal disease: Secondary | ICD-10-CM | POA: Diagnosis not present

## 2018-09-30 DIAGNOSIS — I129 Hypertensive chronic kidney disease with stage 1 through stage 4 chronic kidney disease, or unspecified chronic kidney disease: Secondary | ICD-10-CM | POA: Diagnosis not present

## 2018-09-30 DIAGNOSIS — Z992 Dependence on renal dialysis: Secondary | ICD-10-CM | POA: Diagnosis not present

## 2018-10-01 DIAGNOSIS — E1122 Type 2 diabetes mellitus with diabetic chronic kidney disease: Secondary | ICD-10-CM | POA: Diagnosis not present

## 2018-10-01 DIAGNOSIS — Z992 Dependence on renal dialysis: Secondary | ICD-10-CM | POA: Diagnosis not present

## 2018-10-01 DIAGNOSIS — N186 End stage renal disease: Secondary | ICD-10-CM | POA: Diagnosis not present

## 2018-10-01 DIAGNOSIS — N2581 Secondary hyperparathyroidism of renal origin: Secondary | ICD-10-CM | POA: Diagnosis not present

## 2018-10-01 DIAGNOSIS — D689 Coagulation defect, unspecified: Secondary | ICD-10-CM | POA: Diagnosis not present

## 2018-10-01 DIAGNOSIS — D631 Anemia in chronic kidney disease: Secondary | ICD-10-CM | POA: Diagnosis not present

## 2018-10-04 DIAGNOSIS — N2581 Secondary hyperparathyroidism of renal origin: Secondary | ICD-10-CM | POA: Diagnosis not present

## 2018-10-04 DIAGNOSIS — D631 Anemia in chronic kidney disease: Secondary | ICD-10-CM | POA: Diagnosis not present

## 2018-10-04 DIAGNOSIS — N186 End stage renal disease: Secondary | ICD-10-CM | POA: Diagnosis not present

## 2018-10-04 DIAGNOSIS — D689 Coagulation defect, unspecified: Secondary | ICD-10-CM | POA: Diagnosis not present

## 2018-10-04 DIAGNOSIS — E1122 Type 2 diabetes mellitus with diabetic chronic kidney disease: Secondary | ICD-10-CM | POA: Diagnosis not present

## 2018-10-04 DIAGNOSIS — Z992 Dependence on renal dialysis: Secondary | ICD-10-CM | POA: Diagnosis not present

## 2018-10-06 DIAGNOSIS — N186 End stage renal disease: Secondary | ICD-10-CM | POA: Diagnosis not present

## 2018-10-06 DIAGNOSIS — D689 Coagulation defect, unspecified: Secondary | ICD-10-CM | POA: Diagnosis not present

## 2018-10-06 DIAGNOSIS — D631 Anemia in chronic kidney disease: Secondary | ICD-10-CM | POA: Diagnosis not present

## 2018-10-06 DIAGNOSIS — E1122 Type 2 diabetes mellitus with diabetic chronic kidney disease: Secondary | ICD-10-CM | POA: Diagnosis not present

## 2018-10-06 DIAGNOSIS — N2581 Secondary hyperparathyroidism of renal origin: Secondary | ICD-10-CM | POA: Diagnosis not present

## 2018-10-06 DIAGNOSIS — Z992 Dependence on renal dialysis: Secondary | ICD-10-CM | POA: Diagnosis not present

## 2018-10-08 DIAGNOSIS — D689 Coagulation defect, unspecified: Secondary | ICD-10-CM | POA: Diagnosis not present

## 2018-10-08 DIAGNOSIS — N2581 Secondary hyperparathyroidism of renal origin: Secondary | ICD-10-CM | POA: Diagnosis not present

## 2018-10-08 DIAGNOSIS — E1122 Type 2 diabetes mellitus with diabetic chronic kidney disease: Secondary | ICD-10-CM | POA: Diagnosis not present

## 2018-10-08 DIAGNOSIS — N186 End stage renal disease: Secondary | ICD-10-CM | POA: Diagnosis not present

## 2018-10-08 DIAGNOSIS — D631 Anemia in chronic kidney disease: Secondary | ICD-10-CM | POA: Diagnosis not present

## 2018-10-08 DIAGNOSIS — Z992 Dependence on renal dialysis: Secondary | ICD-10-CM | POA: Diagnosis not present

## 2018-10-11 DIAGNOSIS — D689 Coagulation defect, unspecified: Secondary | ICD-10-CM | POA: Diagnosis not present

## 2018-10-11 DIAGNOSIS — N186 End stage renal disease: Secondary | ICD-10-CM | POA: Diagnosis not present

## 2018-10-11 DIAGNOSIS — E1122 Type 2 diabetes mellitus with diabetic chronic kidney disease: Secondary | ICD-10-CM | POA: Diagnosis not present

## 2018-10-11 DIAGNOSIS — Z992 Dependence on renal dialysis: Secondary | ICD-10-CM | POA: Diagnosis not present

## 2018-10-11 DIAGNOSIS — N2581 Secondary hyperparathyroidism of renal origin: Secondary | ICD-10-CM | POA: Diagnosis not present

## 2018-10-11 DIAGNOSIS — D631 Anemia in chronic kidney disease: Secondary | ICD-10-CM | POA: Diagnosis not present

## 2018-10-13 DIAGNOSIS — N186 End stage renal disease: Secondary | ICD-10-CM | POA: Diagnosis not present

## 2018-10-13 DIAGNOSIS — D689 Coagulation defect, unspecified: Secondary | ICD-10-CM | POA: Diagnosis not present

## 2018-10-13 DIAGNOSIS — Z992 Dependence on renal dialysis: Secondary | ICD-10-CM | POA: Diagnosis not present

## 2018-10-13 DIAGNOSIS — E1122 Type 2 diabetes mellitus with diabetic chronic kidney disease: Secondary | ICD-10-CM | POA: Diagnosis not present

## 2018-10-13 DIAGNOSIS — N2581 Secondary hyperparathyroidism of renal origin: Secondary | ICD-10-CM | POA: Diagnosis not present

## 2018-10-13 DIAGNOSIS — D631 Anemia in chronic kidney disease: Secondary | ICD-10-CM | POA: Diagnosis not present

## 2018-10-15 DIAGNOSIS — N2581 Secondary hyperparathyroidism of renal origin: Secondary | ICD-10-CM | POA: Diagnosis not present

## 2018-10-15 DIAGNOSIS — D689 Coagulation defect, unspecified: Secondary | ICD-10-CM | POA: Diagnosis not present

## 2018-10-15 DIAGNOSIS — Z992 Dependence on renal dialysis: Secondary | ICD-10-CM | POA: Diagnosis not present

## 2018-10-15 DIAGNOSIS — E1122 Type 2 diabetes mellitus with diabetic chronic kidney disease: Secondary | ICD-10-CM | POA: Diagnosis not present

## 2018-10-15 DIAGNOSIS — D631 Anemia in chronic kidney disease: Secondary | ICD-10-CM | POA: Diagnosis not present

## 2018-10-15 DIAGNOSIS — N186 End stage renal disease: Secondary | ICD-10-CM | POA: Diagnosis not present

## 2018-10-18 DIAGNOSIS — D689 Coagulation defect, unspecified: Secondary | ICD-10-CM | POA: Diagnosis not present

## 2018-10-18 DIAGNOSIS — N2581 Secondary hyperparathyroidism of renal origin: Secondary | ICD-10-CM | POA: Diagnosis not present

## 2018-10-18 DIAGNOSIS — E1122 Type 2 diabetes mellitus with diabetic chronic kidney disease: Secondary | ICD-10-CM | POA: Diagnosis not present

## 2018-10-18 DIAGNOSIS — D631 Anemia in chronic kidney disease: Secondary | ICD-10-CM | POA: Diagnosis not present

## 2018-10-18 DIAGNOSIS — N186 End stage renal disease: Secondary | ICD-10-CM | POA: Diagnosis not present

## 2018-10-18 DIAGNOSIS — Z992 Dependence on renal dialysis: Secondary | ICD-10-CM | POA: Diagnosis not present

## 2018-10-20 DIAGNOSIS — N186 End stage renal disease: Secondary | ICD-10-CM | POA: Diagnosis not present

## 2018-10-20 DIAGNOSIS — N2581 Secondary hyperparathyroidism of renal origin: Secondary | ICD-10-CM | POA: Diagnosis not present

## 2018-10-20 DIAGNOSIS — D689 Coagulation defect, unspecified: Secondary | ICD-10-CM | POA: Diagnosis not present

## 2018-10-20 DIAGNOSIS — E1122 Type 2 diabetes mellitus with diabetic chronic kidney disease: Secondary | ICD-10-CM | POA: Diagnosis not present

## 2018-10-20 DIAGNOSIS — Z992 Dependence on renal dialysis: Secondary | ICD-10-CM | POA: Diagnosis not present

## 2018-10-20 DIAGNOSIS — D631 Anemia in chronic kidney disease: Secondary | ICD-10-CM | POA: Diagnosis not present

## 2018-10-22 DIAGNOSIS — D689 Coagulation defect, unspecified: Secondary | ICD-10-CM | POA: Diagnosis not present

## 2018-10-22 DIAGNOSIS — N186 End stage renal disease: Secondary | ICD-10-CM | POA: Diagnosis not present

## 2018-10-22 DIAGNOSIS — Z992 Dependence on renal dialysis: Secondary | ICD-10-CM | POA: Diagnosis not present

## 2018-10-22 DIAGNOSIS — D631 Anemia in chronic kidney disease: Secondary | ICD-10-CM | POA: Diagnosis not present

## 2018-10-22 DIAGNOSIS — N2581 Secondary hyperparathyroidism of renal origin: Secondary | ICD-10-CM | POA: Diagnosis not present

## 2018-10-22 DIAGNOSIS — E1122 Type 2 diabetes mellitus with diabetic chronic kidney disease: Secondary | ICD-10-CM | POA: Diagnosis not present

## 2018-10-25 DIAGNOSIS — N186 End stage renal disease: Secondary | ICD-10-CM | POA: Diagnosis not present

## 2018-10-25 DIAGNOSIS — E1122 Type 2 diabetes mellitus with diabetic chronic kidney disease: Secondary | ICD-10-CM | POA: Diagnosis not present

## 2018-10-25 DIAGNOSIS — N2581 Secondary hyperparathyroidism of renal origin: Secondary | ICD-10-CM | POA: Diagnosis not present

## 2018-10-25 DIAGNOSIS — Z992 Dependence on renal dialysis: Secondary | ICD-10-CM | POA: Diagnosis not present

## 2018-10-25 DIAGNOSIS — D689 Coagulation defect, unspecified: Secondary | ICD-10-CM | POA: Diagnosis not present

## 2018-10-25 DIAGNOSIS — D631 Anemia in chronic kidney disease: Secondary | ICD-10-CM | POA: Diagnosis not present

## 2018-10-27 DIAGNOSIS — N186 End stage renal disease: Secondary | ICD-10-CM | POA: Diagnosis not present

## 2018-10-27 DIAGNOSIS — D631 Anemia in chronic kidney disease: Secondary | ICD-10-CM | POA: Diagnosis not present

## 2018-10-27 DIAGNOSIS — D689 Coagulation defect, unspecified: Secondary | ICD-10-CM | POA: Diagnosis not present

## 2018-10-27 DIAGNOSIS — Z992 Dependence on renal dialysis: Secondary | ICD-10-CM | POA: Diagnosis not present

## 2018-10-27 DIAGNOSIS — E1122 Type 2 diabetes mellitus with diabetic chronic kidney disease: Secondary | ICD-10-CM | POA: Diagnosis not present

## 2018-10-27 DIAGNOSIS — N2581 Secondary hyperparathyroidism of renal origin: Secondary | ICD-10-CM | POA: Diagnosis not present

## 2018-10-29 DIAGNOSIS — Z992 Dependence on renal dialysis: Secondary | ICD-10-CM | POA: Diagnosis not present

## 2018-10-29 DIAGNOSIS — E1122 Type 2 diabetes mellitus with diabetic chronic kidney disease: Secondary | ICD-10-CM | POA: Diagnosis not present

## 2018-10-29 DIAGNOSIS — N2581 Secondary hyperparathyroidism of renal origin: Secondary | ICD-10-CM | POA: Diagnosis not present

## 2018-10-29 DIAGNOSIS — D631 Anemia in chronic kidney disease: Secondary | ICD-10-CM | POA: Diagnosis not present

## 2018-10-29 DIAGNOSIS — D689 Coagulation defect, unspecified: Secondary | ICD-10-CM | POA: Diagnosis not present

## 2018-10-29 DIAGNOSIS — N186 End stage renal disease: Secondary | ICD-10-CM | POA: Diagnosis not present

## 2018-10-31 DIAGNOSIS — N186 End stage renal disease: Secondary | ICD-10-CM | POA: Diagnosis not present

## 2018-10-31 DIAGNOSIS — Z992 Dependence on renal dialysis: Secondary | ICD-10-CM | POA: Diagnosis not present

## 2018-10-31 DIAGNOSIS — I129 Hypertensive chronic kidney disease with stage 1 through stage 4 chronic kidney disease, or unspecified chronic kidney disease: Secondary | ICD-10-CM | POA: Diagnosis not present

## 2018-11-01 DIAGNOSIS — N186 End stage renal disease: Secondary | ICD-10-CM | POA: Diagnosis not present

## 2018-11-01 DIAGNOSIS — Z992 Dependence on renal dialysis: Secondary | ICD-10-CM | POA: Diagnosis not present

## 2018-11-01 DIAGNOSIS — D631 Anemia in chronic kidney disease: Secondary | ICD-10-CM | POA: Diagnosis not present

## 2018-11-01 DIAGNOSIS — D689 Coagulation defect, unspecified: Secondary | ICD-10-CM | POA: Diagnosis not present

## 2018-11-01 DIAGNOSIS — N2581 Secondary hyperparathyroidism of renal origin: Secondary | ICD-10-CM | POA: Diagnosis not present

## 2018-11-01 DIAGNOSIS — E1122 Type 2 diabetes mellitus with diabetic chronic kidney disease: Secondary | ICD-10-CM | POA: Diagnosis not present

## 2018-11-03 DIAGNOSIS — D689 Coagulation defect, unspecified: Secondary | ICD-10-CM | POA: Diagnosis not present

## 2018-11-03 DIAGNOSIS — D631 Anemia in chronic kidney disease: Secondary | ICD-10-CM | POA: Diagnosis not present

## 2018-11-03 DIAGNOSIS — N2581 Secondary hyperparathyroidism of renal origin: Secondary | ICD-10-CM | POA: Diagnosis not present

## 2018-11-03 DIAGNOSIS — N186 End stage renal disease: Secondary | ICD-10-CM | POA: Diagnosis not present

## 2018-11-03 DIAGNOSIS — E1122 Type 2 diabetes mellitus with diabetic chronic kidney disease: Secondary | ICD-10-CM | POA: Diagnosis not present

## 2018-11-03 DIAGNOSIS — Z992 Dependence on renal dialysis: Secondary | ICD-10-CM | POA: Diagnosis not present

## 2018-11-05 DIAGNOSIS — N2581 Secondary hyperparathyroidism of renal origin: Secondary | ICD-10-CM | POA: Diagnosis not present

## 2018-11-05 DIAGNOSIS — Z992 Dependence on renal dialysis: Secondary | ICD-10-CM | POA: Diagnosis not present

## 2018-11-05 DIAGNOSIS — E1122 Type 2 diabetes mellitus with diabetic chronic kidney disease: Secondary | ICD-10-CM | POA: Diagnosis not present

## 2018-11-05 DIAGNOSIS — D689 Coagulation defect, unspecified: Secondary | ICD-10-CM | POA: Diagnosis not present

## 2018-11-05 DIAGNOSIS — N186 End stage renal disease: Secondary | ICD-10-CM | POA: Diagnosis not present

## 2018-11-05 DIAGNOSIS — D631 Anemia in chronic kidney disease: Secondary | ICD-10-CM | POA: Diagnosis not present

## 2018-11-08 DIAGNOSIS — D631 Anemia in chronic kidney disease: Secondary | ICD-10-CM | POA: Diagnosis not present

## 2018-11-08 DIAGNOSIS — E1122 Type 2 diabetes mellitus with diabetic chronic kidney disease: Secondary | ICD-10-CM | POA: Diagnosis not present

## 2018-11-08 DIAGNOSIS — D689 Coagulation defect, unspecified: Secondary | ICD-10-CM | POA: Diagnosis not present

## 2018-11-08 DIAGNOSIS — N2581 Secondary hyperparathyroidism of renal origin: Secondary | ICD-10-CM | POA: Diagnosis not present

## 2018-11-08 DIAGNOSIS — Z992 Dependence on renal dialysis: Secondary | ICD-10-CM | POA: Diagnosis not present

## 2018-11-08 DIAGNOSIS — N186 End stage renal disease: Secondary | ICD-10-CM | POA: Diagnosis not present

## 2018-11-10 DIAGNOSIS — N2581 Secondary hyperparathyroidism of renal origin: Secondary | ICD-10-CM | POA: Diagnosis not present

## 2018-11-10 DIAGNOSIS — Z992 Dependence on renal dialysis: Secondary | ICD-10-CM | POA: Diagnosis not present

## 2018-11-10 DIAGNOSIS — N186 End stage renal disease: Secondary | ICD-10-CM | POA: Diagnosis not present

## 2018-11-10 DIAGNOSIS — D631 Anemia in chronic kidney disease: Secondary | ICD-10-CM | POA: Diagnosis not present

## 2018-11-10 DIAGNOSIS — D689 Coagulation defect, unspecified: Secondary | ICD-10-CM | POA: Diagnosis not present

## 2018-11-10 DIAGNOSIS — E1122 Type 2 diabetes mellitus with diabetic chronic kidney disease: Secondary | ICD-10-CM | POA: Diagnosis not present

## 2018-11-12 DIAGNOSIS — D689 Coagulation defect, unspecified: Secondary | ICD-10-CM | POA: Diagnosis not present

## 2018-11-12 DIAGNOSIS — E1122 Type 2 diabetes mellitus with diabetic chronic kidney disease: Secondary | ICD-10-CM | POA: Diagnosis not present

## 2018-11-12 DIAGNOSIS — N2581 Secondary hyperparathyroidism of renal origin: Secondary | ICD-10-CM | POA: Diagnosis not present

## 2018-11-12 DIAGNOSIS — D631 Anemia in chronic kidney disease: Secondary | ICD-10-CM | POA: Diagnosis not present

## 2018-11-12 DIAGNOSIS — Z992 Dependence on renal dialysis: Secondary | ICD-10-CM | POA: Diagnosis not present

## 2018-11-12 DIAGNOSIS — N186 End stage renal disease: Secondary | ICD-10-CM | POA: Diagnosis not present

## 2018-11-15 DIAGNOSIS — D689 Coagulation defect, unspecified: Secondary | ICD-10-CM | POA: Diagnosis not present

## 2018-11-15 DIAGNOSIS — E1122 Type 2 diabetes mellitus with diabetic chronic kidney disease: Secondary | ICD-10-CM | POA: Diagnosis not present

## 2018-11-15 DIAGNOSIS — N186 End stage renal disease: Secondary | ICD-10-CM | POA: Diagnosis not present

## 2018-11-15 DIAGNOSIS — N2581 Secondary hyperparathyroidism of renal origin: Secondary | ICD-10-CM | POA: Diagnosis not present

## 2018-11-15 DIAGNOSIS — D631 Anemia in chronic kidney disease: Secondary | ICD-10-CM | POA: Diagnosis not present

## 2018-11-15 DIAGNOSIS — Z992 Dependence on renal dialysis: Secondary | ICD-10-CM | POA: Diagnosis not present

## 2018-11-16 DIAGNOSIS — Z992 Dependence on renal dialysis: Secondary | ICD-10-CM | POA: Diagnosis not present

## 2018-11-16 DIAGNOSIS — E1122 Type 2 diabetes mellitus with diabetic chronic kidney disease: Secondary | ICD-10-CM | POA: Diagnosis not present

## 2018-11-16 DIAGNOSIS — Z794 Long term (current) use of insulin: Secondary | ICD-10-CM | POA: Diagnosis not present

## 2018-11-16 DIAGNOSIS — N186 End stage renal disease: Secondary | ICD-10-CM | POA: Diagnosis not present

## 2018-11-16 DIAGNOSIS — Z23 Encounter for immunization: Secondary | ICD-10-CM | POA: Diagnosis not present

## 2018-11-17 DIAGNOSIS — D631 Anemia in chronic kidney disease: Secondary | ICD-10-CM | POA: Diagnosis not present

## 2018-11-17 DIAGNOSIS — E1122 Type 2 diabetes mellitus with diabetic chronic kidney disease: Secondary | ICD-10-CM | POA: Diagnosis not present

## 2018-11-17 DIAGNOSIS — N2581 Secondary hyperparathyroidism of renal origin: Secondary | ICD-10-CM | POA: Diagnosis not present

## 2018-11-17 DIAGNOSIS — D689 Coagulation defect, unspecified: Secondary | ICD-10-CM | POA: Diagnosis not present

## 2018-11-17 DIAGNOSIS — N186 End stage renal disease: Secondary | ICD-10-CM | POA: Diagnosis not present

## 2018-11-17 DIAGNOSIS — Z992 Dependence on renal dialysis: Secondary | ICD-10-CM | POA: Diagnosis not present

## 2018-11-19 DIAGNOSIS — D631 Anemia in chronic kidney disease: Secondary | ICD-10-CM | POA: Diagnosis not present

## 2018-11-19 DIAGNOSIS — N186 End stage renal disease: Secondary | ICD-10-CM | POA: Diagnosis not present

## 2018-11-19 DIAGNOSIS — N2581 Secondary hyperparathyroidism of renal origin: Secondary | ICD-10-CM | POA: Diagnosis not present

## 2018-11-19 DIAGNOSIS — D689 Coagulation defect, unspecified: Secondary | ICD-10-CM | POA: Diagnosis not present

## 2018-11-19 DIAGNOSIS — E1122 Type 2 diabetes mellitus with diabetic chronic kidney disease: Secondary | ICD-10-CM | POA: Diagnosis not present

## 2018-11-19 DIAGNOSIS — Z992 Dependence on renal dialysis: Secondary | ICD-10-CM | POA: Diagnosis not present

## 2018-11-22 DIAGNOSIS — N186 End stage renal disease: Secondary | ICD-10-CM | POA: Diagnosis not present

## 2018-11-22 DIAGNOSIS — D631 Anemia in chronic kidney disease: Secondary | ICD-10-CM | POA: Diagnosis not present

## 2018-11-22 DIAGNOSIS — N2581 Secondary hyperparathyroidism of renal origin: Secondary | ICD-10-CM | POA: Diagnosis not present

## 2018-11-22 DIAGNOSIS — E1122 Type 2 diabetes mellitus with diabetic chronic kidney disease: Secondary | ICD-10-CM | POA: Diagnosis not present

## 2018-11-22 DIAGNOSIS — Z992 Dependence on renal dialysis: Secondary | ICD-10-CM | POA: Diagnosis not present

## 2018-11-22 DIAGNOSIS — D689 Coagulation defect, unspecified: Secondary | ICD-10-CM | POA: Diagnosis not present

## 2018-11-24 DIAGNOSIS — N2581 Secondary hyperparathyroidism of renal origin: Secondary | ICD-10-CM | POA: Diagnosis not present

## 2018-11-24 DIAGNOSIS — D689 Coagulation defect, unspecified: Secondary | ICD-10-CM | POA: Diagnosis not present

## 2018-11-24 DIAGNOSIS — Z992 Dependence on renal dialysis: Secondary | ICD-10-CM | POA: Diagnosis not present

## 2018-11-24 DIAGNOSIS — N186 End stage renal disease: Secondary | ICD-10-CM | POA: Diagnosis not present

## 2018-11-24 DIAGNOSIS — D631 Anemia in chronic kidney disease: Secondary | ICD-10-CM | POA: Diagnosis not present

## 2018-11-24 DIAGNOSIS — E1122 Type 2 diabetes mellitus with diabetic chronic kidney disease: Secondary | ICD-10-CM | POA: Diagnosis not present

## 2018-11-26 DIAGNOSIS — D689 Coagulation defect, unspecified: Secondary | ICD-10-CM | POA: Diagnosis not present

## 2018-11-26 DIAGNOSIS — E1122 Type 2 diabetes mellitus with diabetic chronic kidney disease: Secondary | ICD-10-CM | POA: Diagnosis not present

## 2018-11-26 DIAGNOSIS — D631 Anemia in chronic kidney disease: Secondary | ICD-10-CM | POA: Diagnosis not present

## 2018-11-26 DIAGNOSIS — Z992 Dependence on renal dialysis: Secondary | ICD-10-CM | POA: Diagnosis not present

## 2018-11-26 DIAGNOSIS — N186 End stage renal disease: Secondary | ICD-10-CM | POA: Diagnosis not present

## 2018-11-26 DIAGNOSIS — N2581 Secondary hyperparathyroidism of renal origin: Secondary | ICD-10-CM | POA: Diagnosis not present

## 2018-11-29 DIAGNOSIS — N2581 Secondary hyperparathyroidism of renal origin: Secondary | ICD-10-CM | POA: Diagnosis not present

## 2018-11-29 DIAGNOSIS — N186 End stage renal disease: Secondary | ICD-10-CM | POA: Diagnosis not present

## 2018-11-29 DIAGNOSIS — D631 Anemia in chronic kidney disease: Secondary | ICD-10-CM | POA: Diagnosis not present

## 2018-11-29 DIAGNOSIS — D689 Coagulation defect, unspecified: Secondary | ICD-10-CM | POA: Diagnosis not present

## 2018-11-29 DIAGNOSIS — E1122 Type 2 diabetes mellitus with diabetic chronic kidney disease: Secondary | ICD-10-CM | POA: Diagnosis not present

## 2018-11-29 DIAGNOSIS — Z992 Dependence on renal dialysis: Secondary | ICD-10-CM | POA: Diagnosis not present

## 2018-11-30 DIAGNOSIS — N186 End stage renal disease: Secondary | ICD-10-CM | POA: Diagnosis not present

## 2018-11-30 DIAGNOSIS — I129 Hypertensive chronic kidney disease with stage 1 through stage 4 chronic kidney disease, or unspecified chronic kidney disease: Secondary | ICD-10-CM | POA: Diagnosis not present

## 2018-11-30 DIAGNOSIS — Z992 Dependence on renal dialysis: Secondary | ICD-10-CM | POA: Diagnosis not present

## 2018-12-01 DIAGNOSIS — E1122 Type 2 diabetes mellitus with diabetic chronic kidney disease: Secondary | ICD-10-CM | POA: Diagnosis not present

## 2018-12-01 DIAGNOSIS — D689 Coagulation defect, unspecified: Secondary | ICD-10-CM | POA: Diagnosis not present

## 2018-12-01 DIAGNOSIS — N186 End stage renal disease: Secondary | ICD-10-CM | POA: Diagnosis not present

## 2018-12-01 DIAGNOSIS — Z992 Dependence on renal dialysis: Secondary | ICD-10-CM | POA: Diagnosis not present

## 2018-12-01 DIAGNOSIS — N2581 Secondary hyperparathyroidism of renal origin: Secondary | ICD-10-CM | POA: Diagnosis not present

## 2018-12-01 DIAGNOSIS — D631 Anemia in chronic kidney disease: Secondary | ICD-10-CM | POA: Diagnosis not present

## 2018-12-02 ENCOUNTER — Encounter: Payer: Self-pay | Admitting: Cardiovascular Disease

## 2018-12-02 ENCOUNTER — Ambulatory Visit (INDEPENDENT_AMBULATORY_CARE_PROVIDER_SITE_OTHER): Payer: Medicare Other | Admitting: Cardiovascular Disease

## 2018-12-02 ENCOUNTER — Other Ambulatory Visit: Payer: Self-pay

## 2018-12-02 VITALS — BP 102/68 | HR 79 | Temp 97.1°F | Ht 66.0 in | Wt 336.6 lb

## 2018-12-02 DIAGNOSIS — E785 Hyperlipidemia, unspecified: Secondary | ICD-10-CM

## 2018-12-02 DIAGNOSIS — E1121 Type 2 diabetes mellitus with diabetic nephropathy: Secondary | ICD-10-CM

## 2018-12-02 DIAGNOSIS — Z992 Dependence on renal dialysis: Secondary | ICD-10-CM

## 2018-12-02 DIAGNOSIS — G4733 Obstructive sleep apnea (adult) (pediatric): Secondary | ICD-10-CM | POA: Diagnosis not present

## 2018-12-02 DIAGNOSIS — Z794 Long term (current) use of insulin: Secondary | ICD-10-CM

## 2018-12-02 DIAGNOSIS — N186 End stage renal disease: Secondary | ICD-10-CM | POA: Diagnosis not present

## 2018-12-02 NOTE — Patient Instructions (Addendum)
Medication Instructions:  The current medical regimen is effective;  continue present plan and medications.  If you need a refill on your cardiac medications before your next appointment, please call your pharmacy.   Follow-Up: At [facility], you and your health needs are our priority.  As part of our continuing mission to provide you with exceptional heart care, we have created designated Provider Care Teams.  These Care Teams include your primary Cardiologist (physician) and Advanced Practice Providers (APPs -  Physician Assistants and Nurse Practitioners) who all work together to provide you with the care you need, when you need it. You will need a follow up appointment in 12 months.  Please call our office 2 months in advance to schedule this appointment.  You may see Dr.Kelly (sleep).

## 2018-12-02 NOTE — Progress Notes (Signed)
Cardiology Office Note    Date:  12/02/2018   ID:  [name] 07/25/1955, MRN [id number]  PCP:  Deland Pretty, MD  Cardiologist:  Laura Majestic, MD   Chief Complaint  Patient presents with  . Follow-up    History of Present Illness:  [name] is a 63 y.o. female who referred by [name] for me to assume care of her obstructive sleep apnea.  She had established cardiology care with me in March 2018. I last saw her in June 2019.  She presents for a one year follow-up cardiology/sleep evaluation.  [name] was seen by [name] in May 2017.  She has a history of hypertension, hyperlipidemia, obstructive sleep apnea, stage IV renal disease and was not yet on dialysis.  She has had heart failure symptoms and volume overload.  She was admitted to [facility] hospital in May 2017 for acute on chronic heart failure in the setting of worsening renal failure.  During that hospitalization, troponins were elevated.  An echo Doppler study showed an EF of 55-60%, severe LVH, and mild Horn hypertension.  She was started on hemodialysis and required AV fistula revision.  She was transfused due to worsening edema and ultimately was hospitalized for close to one month and later discharged to skilled nursing facility.  She was seen in follow-up in the office and was tachycardic.  She was last seen in August 2017 by [name] and was having some mild dizziness after dialysis. She was instructed to hold the night dose of Toprol prior to her dialysis session.  The patient has requested establishment of care with me and presents for evaluation.  She denies any episodes of chest tightness.  She denies recent episodes of dizziness.  Her volume overload has resolved with implementation of dialysis.  She has a history of obstructive sleep apnea and was followed remotely at [facility] by [name].  She had undergone a sleep study at [place] in 2009.  Overall AHI was 12.8, but she had  severe sleep apnea with rim sleep at 43.9.  Remotely, she had been set at a 14 cm pressure and with [facility] as her DME.  Her last download was in 2012.  She had not been using CPAP, regular labor recently has increased usage.  Without CPAP therapy.  She would wake up every 2 hours.  Her sleep has improved with reinstitution of treatment.  She typically goes to bed at 11 PM and wakes up at 7 AM.  She is followed by [name] for renal disease.  When I saw her in March 2018 she continued to have excessive daytime sleepiness and an Epworth Sleepiness Scale score endorsed at 13.  Epworth Sleepiness Scale: Situation   Chance of Dozing/Sleeping (0 = never , 1 = slight chance , 2 = moderate chance , 3 = high chance )   sitting and reading 2   watching TV 3   sitting inactive in a public place 1   being a passenger in a motor vehicle for an hour or more 1   lying down in the afternoon 2   sitting and talking to someone 1   sitting quietly after lunch (no alcohol) 2   while stopped for a few minutes in traffic as the driver 1   Total Score  13   Since it had been over 9 years since her initial sleep evaluation.  I recommended a new sleep evaluation  and machine.  The sleep study was done on 01/30/2007 which showed moderate sleep apnea were all within AHI of 19.7 per hour.  However, her sleep apnea was very severe doing REM sleep with an HI of 43.6 per hour.  She had significant oxygen desaturation to a nadir of 81% and there was moderate snoring.     She underwent her CPAP titration study on April 26, 2017.  Her new CPAP set up date was June 15, 2017 and [facility] is her DME company.  When I saw her in June 2019 she was sleeping much better.  Her lactate she has a Fisher-[employer] Simplus medium full facemask.  A new download was obtained in the office today from Oct 07, 2017 through November 05, 2017.  She is 100% compliant.  However sleep duration iwas only 5 hours and 25 minutes.  AHI is  3.4 on her 9 cm water pressure.  There is no leak.  Typically she goes to bed at 10:30 but wakes up at 3:45 on dialysis days.  As result she is not sleeping for long enough duration.  She was unaware of breakthrough snoring.  Over the past year, she has continued to feel significantly improved and has remained compliant with CPAP therapy.  A new download was obtained from June 2 through December 01, 2018 which shows 100% compliance.  However, usage days is only 5 hours and 11 minutes.  Typically she has been going to bed at 10 PM and wakes up at 3:30 AM on dialysis mornings.  Oftentimes on the night prior to nondialysis days she has been going to bed later but sleeping longer in the morning.  She has been on a set pressure of 9 cm.  AHI is 3.0.  He denies any snoring.  She is more alert.  A new Epworth Sleepiness Scale score was calculated today in the office and this endorsed at 12 which is consistent with mild residual hypersomnolence, most likely due to her reduced sleep duration rather than CPAP efficacy.  Past Medical History:  Diagnosis Date  . Arthritis   . Chronic diastolic CHF (congestive heart failure) ([place])    a. Echo 5/17: severe LVH, EF 55-60%, mild AS, mean AV gradient 10 mmHg, mod LAE, PASP 37 mmHg  . Diabetes mellitus without complication ([place])    type 2  . ESRD (end stage renal disease) ([facility])    Tues, Thurs, Sat dialysis  . H/O blood clots    many years ago in leg  . History of pneumonia   . HLD (hyperlipidemia)   . Hypertension    BP low after dialysis started, no further BP meds  . Seizures ([place])    had one or two (more than 10-15 years ago)  . Sleep apnea    uses c-pap most of time    Past Surgical History:  Procedure Laterality Date  . ABDOMINAL HYSTERECTOMY    . AV FISTULA PLACEMENT Left 07/25/2015   Procedure: ARTERIOVENOUS (AV) FISTULA CREATION -LEFT BRACHIOCEPHALIC;  Surgeon: Laura Independence, MD;  Location: [facility];  Service: Vascular;  Laterality: Left;  . COLONOSCOPY     . COLONOSCOPY WITH PROPOFOL N/A 11/18/2017   Procedure: COLONOSCOPY WITH PROPOFOL;  Surgeon: Laura Silence, MD;  Location: [facility];  Service: Endoscopy;  Laterality: N/A;  . FISTULA SUPERFICIALIZATION Left 11/01/2015   Procedure: SUPERFICIALIZATION OF LEFT ARM BRACHIOCEPHALIC FISTULA ;  Surgeon: Laura Sturgeon, MD;  Location: [facility];  Service: Vascular;  Laterality: Left;  .  INSERTION OF DIALYSIS CATHETER Right 10/09/2015   Procedure: INSERTION OF DIALYSIS CATHETER RIGHT INTERNAL JUGULAR ;  Surgeon: Angelia Mould, MD;  Location: [facility];  Service: Vascular;  Laterality: Right;  . POLYPECTOMY  11/18/2017   Procedure: POLYPECTOMY;  Surgeon: Laura Silence, MD;  Location: [facility];  Service: Endoscopy;;    Current Medications: Outpatient Medications Prior to Visit  Medication Sig Dispense Refill  . aspirin 81 MG tablet Take 81 mg by mouth daily.    Marland Kitchen atorvastatin (LIPITOR) 40 MG tablet Take 40 mg by mouth daily.    . cinacalcet (SENSIPAR) 60 MG tablet Take 60 mg by mouth daily.     Marland Kitchen docusate sodium (COLACE) 50 MG capsule Take 100 mg by mouth 2 (two) times daily.     . febuxostat (ULORIC) 40 MG tablet Take 40 mg by mouth daily.    Marland Kitchen FOSRENOL 1000 MG chewable tablet Chew 1,000 mg by mouth 3 (three) times daily with meals.    . lidocaine-prilocaine (EMLA) cream Apply 1 application topically as needed (local anesthesia).    . loratadine (CLARITIN) 10 MG tablet Take 1 tablet by mouth daily.     . metoprolol succinate (TOPROL-XL) 50 MG 24 hr tablet Take 25 mg by mouth every other day. DO NOT TAKE THE NIGHT PRIOR TO YOUR DIALYSIS  0  . multivitamin (RENA-VIT) TABS tablet Take 1 tablet by mouth daily. 30 each 0  . ONETOUCH VERIO test strip TEST 2 TIMES A DAY AND AS NEEDED FOR BLOOD SUGAR  0  . TRADJENTA 5 MG TABS tablet     . TRESIBA FLEXTOUCH 100 UNIT/ML SOPN FlexTouch Pen Inject 14 Units into the skin daily. Dinner  0   No facility-administered medications prior to visit.       Allergies:   Morphine and related   Social History   Socioeconomic History  . Marital status: Single    Spouse name: Not on file  . Number of children: Not on file  . Years of education: Not on file  . Highest education level: Not on file  Occupational History  . Not on file  Social Needs  . Financial resource strain: Not on file  . Food insecurity    Worry: Not on file    Inability: Not on file  . Transportation needs    Medical: Not on file    Non-medical: Not on file  Tobacco Use  . Smoking status: Never Smoker  . Smokeless tobacco: Never Used  Substance and Sexual Activity  . Alcohol use: No    Alcohol/week: 0.0 standard drinks  . Drug use: No  . Sexual activity: Not on file  Lifestyle  . Physical activity    Days per week: Not on file    Minutes per session: Not on file  . Stress: Not on file  Relationships  . Social Herbalist on phone: Not on file    Gets together: Not on file    Attends religious service: Not on file    Active member of club or organization: Not on file    Attends meetings of clubs or organizations: Not on file    Relationship status: Not on file  Other Topics Concern  . Not on file  Social History Narrative   epworth sleepiness scale score 12    I have taken care of her family member, Nadara Mustard  Family History:  The patient's family history includes Cancer in her mother; Heart attack in her  paternal aunt and sister; Hypertension in her brother, brother, father, sister, sister, sister, and sister.   ROS General: Negative; No fevers, chills, or night sweats; positive for super morbid obesity HEENT: Negative; No changes in vision or hearing, sinus congestion, difficulty swallowing Pulmonary: Negative; No cough, wheezing, shortness of breath, hemoptysis Cardiovascular: Negative; No chest pain, presyncope, syncope, palpitations GI: Negative; No nausea, vomiting, diarrhea, or abdominal pain GU: End-stage renal disease, now on  dialysis on Tuesdays, Thursdays and Saturdays Musculoskeletal: Negative; no myalgias, joint pain, or weakness Hematologic/Oncology: Negative; no easy bruising, bleeding Endocrine: Positive for diabetes mellitus Neuro: Negative; no changes in balance, headaches Skin: Negative; No rashes or skin lesions Psychiatric: Negative; No behavioral problems, depression Sleep: Positive for OSA documented in 2009.  No awareness of breakthrough snoring; no bruxism, restless legs, hypnogognic hallucinations, no cataplexy.  New sleep study was reviewed.  CPAP titration is outstanding Other comprehensive 14 point system review is negative.   PHYSICAL EXAM:   VS:  BP 102/68   Pulse 79   Temp (!) 97.1 F (36.2 C)   Ht '5\' 6"'  (1.676 m)   Wt (!) 336 lb 9.6 oz (152.7 kg)   SpO2 98%   BMI 54.33 kg/m      Wt Readings from Last 3 Encounters:  12/02/18 (!) 336 lb 9.6 oz (152.7 kg)  11/18/17 (!) 344 lb (156 kg)  11/11/17 (!) 344 lb (156 kg)    General: Alert, oriented, no distress.  Super morbid obesity Skin: normal turgor, no rashes, warm and dry HEENT: Normocephalic, atraumatic. Pupils equal round and reactive to light; sclera anicteric; extraocular muscles intact;  Nose without nasal septal hypertrophy Mouth/Parynx benign; Mallinpatti scale 3/4 Neck: No JVD, no carotid bruits; normal carotid upstroke Lungs: clear to ausculatation and percussion; no wheezing or rales Chest wall: without tenderness to palpitation Heart: PMI not displaced, RRR, s1 s2 normal, 1/6 systolic murmur, no diastolic murmur, no rubs, gallops, thrills, or heaves Abdomen: Significant central adiposity soft, nontender; no hepatosplenomehaly, BS+; abdominal aorta nontender and not dilated by palpation. Back: no CVA tenderness Pulses 2+ Musculoskeletal: full range of motion, normal strength, no joint deformities Extremities: Venous stasis lower extremity; left upper extremity AV fistula no clubbing cyanosis , Homan's sign negative   Neurologic: grossly nonfocal; Cranial nerves grossly wnl Psychologic: Normal mood and affect    Studies/Labs Reviewed:   No ECG obtained today; multiple attempts were made but due to the patient's body habitus with significant inframammary perspiration; suboptimal quality  June 2019 ECG (independently read by me): Sinus rhythm at 77 bpm.  Poor anterior R wave progression.  No ectopy.  QTc interval 468 Laura.  October 2018 ECG (independently read by me): Normal sinus rhythm at 79 bpm..  No ST segment changes.  QTc interval 463 Laura.  June 2018 EKG:  EKG is ordered today. Sinus tachycardia 105 bpm.  QTc interval 460 Laura.  Q wave in lead 3.  PR interval 182 Laura.  March 2018 ECG (independently read by me): Normal sinus rhythm at 75 bpm.  Nonspecific ST changes.  Normal intervals.  Recent Labs: BMP Latest Ref Rng & Units 11/18/2017 11/06/2015 11/03/2015  Glucose 65 - 99 mg/dL 112(H) 97 87  BUN 6 - 20 mg/dL - 25(H) 20  Creatinine 0.44 - 1.00 mg/dL - 8.13(H) 7.41(H)  Sodium 135 - 145 mmol/L 138 135 136  Potassium 3.5 - 5.1 mmol/L 4.2 4.3 3.8  Chloride 101 - 111 mmol/L - 96(L) 97(L)  CO2 22 - 32 mmol/L -  30 28  Calcium 8.9 - 10.3 mg/dL - 8.2(L) 8.3(L)     Hepatic Function Latest Ref Rng & Units 11/06/2015 11/03/2015 10/30/2015  Total Protein 6.5 - 8.1 g/dL - - -  Albumin 3.5 - 5.0 g/dL 2.6(L) 2.6(L) 2.6(L)  AST 15 - 41 U/L - - -  ALT 14 - 54 U/L - - -  Alk Phosphatase 38 - 126 U/L - - -  Total Bilirubin 0.3 - 1.2 mg/dL - - -    CBC Latest Ref Rng & Units 11/18/2017 11/06/2015 11/03/2015  WBC 4.0 - 10.5 K/uL - 6.2 7.0  Hemoglobin 12.0 - 15.0 g/dL 14.3 10.0(L) 10.0(L)  Hematocrit 36.0 - 46.0 % 42.0 33.4(L) 32.7(L)  Platelets 150 - 400 K/uL - 245 199   Lab Results  Component Value Date   MCV 89.8 11/06/2015   MCV 88.9 11/03/2015   MCV 90.3 10/30/2015   Lab Results  Component Value Date   TSH 1.541 10/07/2015   Lab Results  Component Value Date   HGBA1C 6.2 (H) 10/07/2015     BNP     Component Value Date/Time   BNP 464.4 (H) 10/04/2015 2319    ProBNP No results found for: PROBNP   Lipid Panel  No results found for: CHOL, TRIG, HDL, CHOLHDL, VLDL, LDLCALC, LDLDIRECT   RADIOLOGY: No results found.   Additional studies/ records that were reviewed today include:  I have reviewed the office notes from her prior providers.  I reviewed her hospitalization.  I reviewed laboratory.  I have reviewed her sleep study from [facility] and the notes of Dr. Gareth Eagle  I generated a new download from Oct 07, 2017 through November 05, 2017 I obtained a new download from June 2 through December 01, 2018  ASSESSMENT:    1. OSA (obstructive sleep apnea)   2. ESRD on dialysis ([place])   3. Morbid obesity, unspecified obesity type ([place])   4. Type 2 diabetes mellitus with diabetic nephropathy, with long-term current use of insulin ([place])   5. Hyperlipidemia with target LDL less than 70     PLAN:  Laura. Ninneman is a 63 year-old female who is a history of hypertension, hyperlipidemia, obstructive sleep apnea, super morbid obesity and end-stage renal disease.  She has been on dialysis since May 2017. Prior to dialysis he had issues with significant volume overload and acute on chronic diastolic heart failure.  Since being on dialysis, her volume status has stabilized.  She transiently had had issues with dialysis and her medications have been reduced. When I initially saw her, her blood pressure was low and she was tachycardic.  As I last saw her, her metoprolol succinate dose has been reduced to 25 mg which she takes at bedtime on days that she does not have dialysis the following morning.  Her dialysis days are now Monday Wednesday and Friday.  I reviewed her sleep apnea data in detail as well as her download.  She is 100% compliant.  However, sleep duration is suboptimal and only 5 hours and 11 minutes which undoubtedly is contributing to her residual daytime sleepiness with her calculated  Epworth Sleepiness Scale score at 12.  She has been going to bed at 10 PM and wakes up at 3:30 AM for dialysis which gives her only 5 and half hours of sleep.  I have recommended that she try to go to bed by 8 or 830 in at least on the days where she has to get up early  she sleeps a minimum of 7 hours and hopefully on the other day she can sleep at least 8 hours duration.  Her AHI is excellent at 3.0 but apnea index is 2.8.  I am slightly titrating her CPAP pressure up to 10 cm.  We again discussed the importance of weight loss with her super morbid obesity and BMI of 54.33.  Her blood pressure today is stable but low.  Her pulse is approximately 80 bpm on current metoprolol succinate dosing.  She is diabetic on Tradjenta.  She is tolerating atorvastatin 40 mg daily for hyperlipidemia with target LDL less than 70.  She will follow-up with [name] for primary care.  She had seen [name] for nephrology and will be apparently seeing Dr. Meredeth Ide.  She does not exercise and walks with a walker.  I will see her in 1 year for follow-up sleep evaluation.   Medication Adjustments/Labs and Tests Ordered: Current medicines are reviewed at length with the patient today.  Concerns regarding medicines are outlined above.  Medication changes, Labs and Tests ordered today are listed in the Patient Instructions below. Patient Instructions  Medication Instructions:  The current medical regimen is effective;  continue present plan and medications.  If you need a refill on your cardiac medications before your next appointment, please call your pharmacy.   Follow-Up: At [facility], you and your health needs are our priority.  As part of our continuing mission to provide you with exceptional heart care, we have created designated Provider Care Teams.  These Care Teams include your primary Cardiologist (physician) and Advanced Practice Providers (APPs -  Physician Assistants and Nurse Practitioners) who all  work together to provide you with the care you need, when you need it. You will need a follow up appointment in 12 months.  Please call our office 2 months in advance to schedule this appointment.  You may see LauraKelly (sleep).        Signed, Laura Majestic, MD, [facility] 12/02/2018 11:33 AM    [facility] [address] [place], [place], [place]  [address] Phone: [phone]

## 2018-12-03 DIAGNOSIS — Z992 Dependence on renal dialysis: Secondary | ICD-10-CM | POA: Diagnosis not present

## 2018-12-03 DIAGNOSIS — D689 Coagulation defect, unspecified: Secondary | ICD-10-CM | POA: Diagnosis not present

## 2018-12-03 DIAGNOSIS — E1122 Type 2 diabetes mellitus with diabetic chronic kidney disease: Secondary | ICD-10-CM | POA: Diagnosis not present

## 2018-12-03 DIAGNOSIS — N186 End stage renal disease: Secondary | ICD-10-CM | POA: Diagnosis not present

## 2018-12-03 DIAGNOSIS — N2581 Secondary hyperparathyroidism of renal origin: Secondary | ICD-10-CM | POA: Diagnosis not present

## 2018-12-03 DIAGNOSIS — D631 Anemia in chronic kidney disease: Secondary | ICD-10-CM | POA: Diagnosis not present

## 2018-12-06 DIAGNOSIS — D689 Coagulation defect, unspecified: Secondary | ICD-10-CM | POA: Diagnosis not present

## 2018-12-06 DIAGNOSIS — Z992 Dependence on renal dialysis: Secondary | ICD-10-CM | POA: Diagnosis not present

## 2018-12-06 DIAGNOSIS — E1122 Type 2 diabetes mellitus with diabetic chronic kidney disease: Secondary | ICD-10-CM | POA: Diagnosis not present

## 2018-12-06 DIAGNOSIS — N186 End stage renal disease: Secondary | ICD-10-CM | POA: Diagnosis not present

## 2018-12-06 DIAGNOSIS — D631 Anemia in chronic kidney disease: Secondary | ICD-10-CM | POA: Diagnosis not present

## 2018-12-06 DIAGNOSIS — N2581 Secondary hyperparathyroidism of renal origin: Secondary | ICD-10-CM | POA: Diagnosis not present

## 2018-12-08 DIAGNOSIS — Z992 Dependence on renal dialysis: Secondary | ICD-10-CM | POA: Diagnosis not present

## 2018-12-08 DIAGNOSIS — D631 Anemia in chronic kidney disease: Secondary | ICD-10-CM | POA: Diagnosis not present

## 2018-12-08 DIAGNOSIS — N186 End stage renal disease: Secondary | ICD-10-CM | POA: Diagnosis not present

## 2018-12-08 DIAGNOSIS — D689 Coagulation defect, unspecified: Secondary | ICD-10-CM | POA: Diagnosis not present

## 2018-12-08 DIAGNOSIS — E1122 Type 2 diabetes mellitus with diabetic chronic kidney disease: Secondary | ICD-10-CM | POA: Diagnosis not present

## 2018-12-08 DIAGNOSIS — N2581 Secondary hyperparathyroidism of renal origin: Secondary | ICD-10-CM | POA: Diagnosis not present

## 2018-12-09 DIAGNOSIS — N2581 Secondary hyperparathyroidism of renal origin: Secondary | ICD-10-CM | POA: Diagnosis not present

## 2018-12-09 DIAGNOSIS — N186 End stage renal disease: Secondary | ICD-10-CM | POA: Diagnosis not present

## 2018-12-09 DIAGNOSIS — D631 Anemia in chronic kidney disease: Secondary | ICD-10-CM | POA: Diagnosis not present

## 2018-12-09 DIAGNOSIS — E1122 Type 2 diabetes mellitus with diabetic chronic kidney disease: Secondary | ICD-10-CM | POA: Diagnosis not present

## 2018-12-09 DIAGNOSIS — Z992 Dependence on renal dialysis: Secondary | ICD-10-CM | POA: Diagnosis not present

## 2018-12-09 DIAGNOSIS — D689 Coagulation defect, unspecified: Secondary | ICD-10-CM | POA: Diagnosis not present

## 2018-12-10 DIAGNOSIS — N186 End stage renal disease: Secondary | ICD-10-CM | POA: Diagnosis not present

## 2018-12-10 DIAGNOSIS — N2581 Secondary hyperparathyroidism of renal origin: Secondary | ICD-10-CM | POA: Diagnosis not present

## 2018-12-10 DIAGNOSIS — Z992 Dependence on renal dialysis: Secondary | ICD-10-CM | POA: Diagnosis not present

## 2018-12-10 DIAGNOSIS — E1122 Type 2 diabetes mellitus with diabetic chronic kidney disease: Secondary | ICD-10-CM | POA: Diagnosis not present

## 2018-12-10 DIAGNOSIS — D631 Anemia in chronic kidney disease: Secondary | ICD-10-CM | POA: Diagnosis not present

## 2018-12-10 DIAGNOSIS — D689 Coagulation defect, unspecified: Secondary | ICD-10-CM | POA: Diagnosis not present

## 2018-12-13 DIAGNOSIS — N2581 Secondary hyperparathyroidism of renal origin: Secondary | ICD-10-CM | POA: Diagnosis not present

## 2018-12-13 DIAGNOSIS — Z992 Dependence on renal dialysis: Secondary | ICD-10-CM | POA: Diagnosis not present

## 2018-12-13 DIAGNOSIS — D689 Coagulation defect, unspecified: Secondary | ICD-10-CM | POA: Diagnosis not present

## 2018-12-13 DIAGNOSIS — E1122 Type 2 diabetes mellitus with diabetic chronic kidney disease: Secondary | ICD-10-CM | POA: Diagnosis not present

## 2018-12-13 DIAGNOSIS — D631 Anemia in chronic kidney disease: Secondary | ICD-10-CM | POA: Diagnosis not present

## 2018-12-13 DIAGNOSIS — N186 End stage renal disease: Secondary | ICD-10-CM | POA: Diagnosis not present

## 2018-12-15 DIAGNOSIS — N2581 Secondary hyperparathyroidism of renal origin: Secondary | ICD-10-CM | POA: Diagnosis not present

## 2018-12-15 DIAGNOSIS — N186 End stage renal disease: Secondary | ICD-10-CM | POA: Diagnosis not present

## 2018-12-15 DIAGNOSIS — Z992 Dependence on renal dialysis: Secondary | ICD-10-CM | POA: Diagnosis not present

## 2018-12-15 DIAGNOSIS — D631 Anemia in chronic kidney disease: Secondary | ICD-10-CM | POA: Diagnosis not present

## 2018-12-15 DIAGNOSIS — E1122 Type 2 diabetes mellitus with diabetic chronic kidney disease: Secondary | ICD-10-CM | POA: Diagnosis not present

## 2018-12-15 DIAGNOSIS — D689 Coagulation defect, unspecified: Secondary | ICD-10-CM | POA: Diagnosis not present

## 2018-12-17 DIAGNOSIS — E1122 Type 2 diabetes mellitus with diabetic chronic kidney disease: Secondary | ICD-10-CM | POA: Diagnosis not present

## 2018-12-17 DIAGNOSIS — N186 End stage renal disease: Secondary | ICD-10-CM | POA: Diagnosis not present

## 2018-12-17 DIAGNOSIS — D689 Coagulation defect, unspecified: Secondary | ICD-10-CM | POA: Diagnosis not present

## 2018-12-17 DIAGNOSIS — Z992 Dependence on renal dialysis: Secondary | ICD-10-CM | POA: Diagnosis not present

## 2018-12-17 DIAGNOSIS — D631 Anemia in chronic kidney disease: Secondary | ICD-10-CM | POA: Diagnosis not present

## 2018-12-17 DIAGNOSIS — N2581 Secondary hyperparathyroidism of renal origin: Secondary | ICD-10-CM | POA: Diagnosis not present

## 2018-12-20 DIAGNOSIS — D631 Anemia in chronic kidney disease: Secondary | ICD-10-CM | POA: Diagnosis not present

## 2018-12-20 DIAGNOSIS — Z992 Dependence on renal dialysis: Secondary | ICD-10-CM | POA: Diagnosis not present

## 2018-12-20 DIAGNOSIS — E1122 Type 2 diabetes mellitus with diabetic chronic kidney disease: Secondary | ICD-10-CM | POA: Diagnosis not present

## 2018-12-20 DIAGNOSIS — N186 End stage renal disease: Secondary | ICD-10-CM | POA: Diagnosis not present

## 2018-12-20 DIAGNOSIS — N2581 Secondary hyperparathyroidism of renal origin: Secondary | ICD-10-CM | POA: Diagnosis not present

## 2018-12-20 DIAGNOSIS — D689 Coagulation defect, unspecified: Secondary | ICD-10-CM | POA: Diagnosis not present

## 2018-12-22 DIAGNOSIS — D631 Anemia in chronic kidney disease: Secondary | ICD-10-CM | POA: Diagnosis not present

## 2018-12-22 DIAGNOSIS — N186 End stage renal disease: Secondary | ICD-10-CM | POA: Diagnosis not present

## 2018-12-22 DIAGNOSIS — D689 Coagulation defect, unspecified: Secondary | ICD-10-CM | POA: Diagnosis not present

## 2018-12-22 DIAGNOSIS — E1122 Type 2 diabetes mellitus with diabetic chronic kidney disease: Secondary | ICD-10-CM | POA: Diagnosis not present

## 2018-12-22 DIAGNOSIS — N2581 Secondary hyperparathyroidism of renal origin: Secondary | ICD-10-CM | POA: Diagnosis not present

## 2018-12-22 DIAGNOSIS — Z992 Dependence on renal dialysis: Secondary | ICD-10-CM | POA: Diagnosis not present

## 2018-12-24 DIAGNOSIS — N186 End stage renal disease: Secondary | ICD-10-CM | POA: Diagnosis not present

## 2018-12-24 DIAGNOSIS — D631 Anemia in chronic kidney disease: Secondary | ICD-10-CM | POA: Diagnosis not present

## 2018-12-24 DIAGNOSIS — Z992 Dependence on renal dialysis: Secondary | ICD-10-CM | POA: Diagnosis not present

## 2018-12-24 DIAGNOSIS — D689 Coagulation defect, unspecified: Secondary | ICD-10-CM | POA: Diagnosis not present

## 2018-12-24 DIAGNOSIS — E1122 Type 2 diabetes mellitus with diabetic chronic kidney disease: Secondary | ICD-10-CM | POA: Diagnosis not present

## 2018-12-24 DIAGNOSIS — N2581 Secondary hyperparathyroidism of renal origin: Secondary | ICD-10-CM | POA: Diagnosis not present

## 2018-12-27 DIAGNOSIS — D689 Coagulation defect, unspecified: Secondary | ICD-10-CM | POA: Diagnosis not present

## 2018-12-27 DIAGNOSIS — Z992 Dependence on renal dialysis: Secondary | ICD-10-CM | POA: Diagnosis not present

## 2018-12-27 DIAGNOSIS — D631 Anemia in chronic kidney disease: Secondary | ICD-10-CM | POA: Diagnosis not present

## 2018-12-27 DIAGNOSIS — E1122 Type 2 diabetes mellitus with diabetic chronic kidney disease: Secondary | ICD-10-CM | POA: Diagnosis not present

## 2018-12-27 DIAGNOSIS — N2581 Secondary hyperparathyroidism of renal origin: Secondary | ICD-10-CM | POA: Diagnosis not present

## 2018-12-27 DIAGNOSIS — N186 End stage renal disease: Secondary | ICD-10-CM | POA: Diagnosis not present

## 2018-12-29 DIAGNOSIS — N186 End stage renal disease: Secondary | ICD-10-CM | POA: Diagnosis not present

## 2018-12-29 DIAGNOSIS — Z992 Dependence on renal dialysis: Secondary | ICD-10-CM | POA: Diagnosis not present

## 2018-12-29 DIAGNOSIS — E1122 Type 2 diabetes mellitus with diabetic chronic kidney disease: Secondary | ICD-10-CM | POA: Diagnosis not present

## 2018-12-29 DIAGNOSIS — D689 Coagulation defect, unspecified: Secondary | ICD-10-CM | POA: Diagnosis not present

## 2018-12-29 DIAGNOSIS — N2581 Secondary hyperparathyroidism of renal origin: Secondary | ICD-10-CM | POA: Diagnosis not present

## 2018-12-29 DIAGNOSIS — D631 Anemia in chronic kidney disease: Secondary | ICD-10-CM | POA: Diagnosis not present

## 2018-12-31 DIAGNOSIS — I129 Hypertensive chronic kidney disease with stage 1 through stage 4 chronic kidney disease, or unspecified chronic kidney disease: Secondary | ICD-10-CM | POA: Diagnosis not present

## 2018-12-31 DIAGNOSIS — E1122 Type 2 diabetes mellitus with diabetic chronic kidney disease: Secondary | ICD-10-CM | POA: Diagnosis not present

## 2018-12-31 DIAGNOSIS — Z992 Dependence on renal dialysis: Secondary | ICD-10-CM | POA: Diagnosis not present

## 2018-12-31 DIAGNOSIS — N186 End stage renal disease: Secondary | ICD-10-CM | POA: Diagnosis not present

## 2018-12-31 DIAGNOSIS — N2581 Secondary hyperparathyroidism of renal origin: Secondary | ICD-10-CM | POA: Diagnosis not present

## 2018-12-31 DIAGNOSIS — D631 Anemia in chronic kidney disease: Secondary | ICD-10-CM | POA: Diagnosis not present

## 2018-12-31 DIAGNOSIS — D689 Coagulation defect, unspecified: Secondary | ICD-10-CM | POA: Diagnosis not present

## 2019-01-03 DIAGNOSIS — N186 End stage renal disease: Secondary | ICD-10-CM | POA: Diagnosis not present

## 2019-01-03 DIAGNOSIS — Z992 Dependence on renal dialysis: Secondary | ICD-10-CM | POA: Diagnosis not present

## 2019-01-03 DIAGNOSIS — D689 Coagulation defect, unspecified: Secondary | ICD-10-CM | POA: Diagnosis not present

## 2019-01-03 DIAGNOSIS — N2581 Secondary hyperparathyroidism of renal origin: Secondary | ICD-10-CM | POA: Diagnosis not present

## 2019-01-05 DIAGNOSIS — Z992 Dependence on renal dialysis: Secondary | ICD-10-CM | POA: Diagnosis not present

## 2019-01-05 DIAGNOSIS — D689 Coagulation defect, unspecified: Secondary | ICD-10-CM | POA: Diagnosis not present

## 2019-01-05 DIAGNOSIS — N186 End stage renal disease: Secondary | ICD-10-CM | POA: Diagnosis not present

## 2019-01-05 DIAGNOSIS — N2581 Secondary hyperparathyroidism of renal origin: Secondary | ICD-10-CM | POA: Diagnosis not present

## 2019-01-07 DIAGNOSIS — N2581 Secondary hyperparathyroidism of renal origin: Secondary | ICD-10-CM | POA: Diagnosis not present

## 2019-01-07 DIAGNOSIS — N186 End stage renal disease: Secondary | ICD-10-CM | POA: Diagnosis not present

## 2019-01-07 DIAGNOSIS — D689 Coagulation defect, unspecified: Secondary | ICD-10-CM | POA: Diagnosis not present

## 2019-01-07 DIAGNOSIS — Z992 Dependence on renal dialysis: Secondary | ICD-10-CM | POA: Diagnosis not present

## 2019-01-10 DIAGNOSIS — N186 End stage renal disease: Secondary | ICD-10-CM | POA: Diagnosis not present

## 2019-01-10 DIAGNOSIS — Z992 Dependence on renal dialysis: Secondary | ICD-10-CM | POA: Diagnosis not present

## 2019-01-10 DIAGNOSIS — N2581 Secondary hyperparathyroidism of renal origin: Secondary | ICD-10-CM | POA: Diagnosis not present

## 2019-01-10 DIAGNOSIS — D689 Coagulation defect, unspecified: Secondary | ICD-10-CM | POA: Diagnosis not present

## 2019-01-12 DIAGNOSIS — N186 End stage renal disease: Secondary | ICD-10-CM | POA: Diagnosis not present

## 2019-01-12 DIAGNOSIS — Z992 Dependence on renal dialysis: Secondary | ICD-10-CM | POA: Diagnosis not present

## 2019-01-12 DIAGNOSIS — D689 Coagulation defect, unspecified: Secondary | ICD-10-CM | POA: Diagnosis not present

## 2019-01-12 DIAGNOSIS — N2581 Secondary hyperparathyroidism of renal origin: Secondary | ICD-10-CM | POA: Diagnosis not present

## 2019-01-14 DIAGNOSIS — Z992 Dependence on renal dialysis: Secondary | ICD-10-CM | POA: Diagnosis not present

## 2019-01-14 DIAGNOSIS — N2581 Secondary hyperparathyroidism of renal origin: Secondary | ICD-10-CM | POA: Diagnosis not present

## 2019-01-14 DIAGNOSIS — N186 End stage renal disease: Secondary | ICD-10-CM | POA: Diagnosis not present

## 2019-01-14 DIAGNOSIS — D689 Coagulation defect, unspecified: Secondary | ICD-10-CM | POA: Diagnosis not present

## 2019-01-17 DIAGNOSIS — D689 Coagulation defect, unspecified: Secondary | ICD-10-CM | POA: Diagnosis not present

## 2019-01-17 DIAGNOSIS — N186 End stage renal disease: Secondary | ICD-10-CM | POA: Diagnosis not present

## 2019-01-17 DIAGNOSIS — N2581 Secondary hyperparathyroidism of renal origin: Secondary | ICD-10-CM | POA: Diagnosis not present

## 2019-01-17 DIAGNOSIS — Z992 Dependence on renal dialysis: Secondary | ICD-10-CM | POA: Diagnosis not present

## 2019-01-19 DIAGNOSIS — N186 End stage renal disease: Secondary | ICD-10-CM | POA: Diagnosis not present

## 2019-01-19 DIAGNOSIS — Z992 Dependence on renal dialysis: Secondary | ICD-10-CM | POA: Diagnosis not present

## 2019-01-19 DIAGNOSIS — N2581 Secondary hyperparathyroidism of renal origin: Secondary | ICD-10-CM | POA: Diagnosis not present

## 2019-01-19 DIAGNOSIS — D689 Coagulation defect, unspecified: Secondary | ICD-10-CM | POA: Diagnosis not present

## 2019-01-21 DIAGNOSIS — Z992 Dependence on renal dialysis: Secondary | ICD-10-CM | POA: Diagnosis not present

## 2019-01-21 DIAGNOSIS — N2581 Secondary hyperparathyroidism of renal origin: Secondary | ICD-10-CM | POA: Diagnosis not present

## 2019-01-21 DIAGNOSIS — N186 End stage renal disease: Secondary | ICD-10-CM | POA: Diagnosis not present

## 2019-01-21 DIAGNOSIS — D689 Coagulation defect, unspecified: Secondary | ICD-10-CM | POA: Diagnosis not present

## 2019-01-24 DIAGNOSIS — Z992 Dependence on renal dialysis: Secondary | ICD-10-CM | POA: Diagnosis not present

## 2019-01-24 DIAGNOSIS — D689 Coagulation defect, unspecified: Secondary | ICD-10-CM | POA: Diagnosis not present

## 2019-01-24 DIAGNOSIS — N186 End stage renal disease: Secondary | ICD-10-CM | POA: Diagnosis not present

## 2019-01-24 DIAGNOSIS — N2581 Secondary hyperparathyroidism of renal origin: Secondary | ICD-10-CM | POA: Diagnosis not present

## 2019-01-26 DIAGNOSIS — N2581 Secondary hyperparathyroidism of renal origin: Secondary | ICD-10-CM | POA: Diagnosis not present

## 2019-01-26 DIAGNOSIS — Z992 Dependence on renal dialysis: Secondary | ICD-10-CM | POA: Diagnosis not present

## 2019-01-26 DIAGNOSIS — N186 End stage renal disease: Secondary | ICD-10-CM | POA: Diagnosis not present

## 2019-01-26 DIAGNOSIS — D689 Coagulation defect, unspecified: Secondary | ICD-10-CM | POA: Diagnosis not present

## 2019-01-28 DIAGNOSIS — D689 Coagulation defect, unspecified: Secondary | ICD-10-CM | POA: Diagnosis not present

## 2019-01-28 DIAGNOSIS — Z992 Dependence on renal dialysis: Secondary | ICD-10-CM | POA: Diagnosis not present

## 2019-01-28 DIAGNOSIS — N186 End stage renal disease: Secondary | ICD-10-CM | POA: Diagnosis not present

## 2019-01-28 DIAGNOSIS — N2581 Secondary hyperparathyroidism of renal origin: Secondary | ICD-10-CM | POA: Diagnosis not present

## 2019-01-31 DIAGNOSIS — Z992 Dependence on renal dialysis: Secondary | ICD-10-CM | POA: Diagnosis not present

## 2019-01-31 DIAGNOSIS — I129 Hypertensive chronic kidney disease with stage 1 through stage 4 chronic kidney disease, or unspecified chronic kidney disease: Secondary | ICD-10-CM | POA: Diagnosis not present

## 2019-01-31 DIAGNOSIS — N2581 Secondary hyperparathyroidism of renal origin: Secondary | ICD-10-CM | POA: Diagnosis not present

## 2019-01-31 DIAGNOSIS — D689 Coagulation defect, unspecified: Secondary | ICD-10-CM | POA: Diagnosis not present

## 2019-01-31 DIAGNOSIS — N186 End stage renal disease: Secondary | ICD-10-CM | POA: Diagnosis not present

## 2019-02-02 DIAGNOSIS — N186 End stage renal disease: Secondary | ICD-10-CM | POA: Diagnosis not present

## 2019-02-02 DIAGNOSIS — N2581 Secondary hyperparathyroidism of renal origin: Secondary | ICD-10-CM | POA: Diagnosis not present

## 2019-02-02 DIAGNOSIS — D631 Anemia in chronic kidney disease: Secondary | ICD-10-CM | POA: Diagnosis not present

## 2019-02-02 DIAGNOSIS — Z23 Encounter for immunization: Secondary | ICD-10-CM | POA: Diagnosis not present

## 2019-02-02 DIAGNOSIS — D689 Coagulation defect, unspecified: Secondary | ICD-10-CM | POA: Diagnosis not present

## 2019-02-02 DIAGNOSIS — Z992 Dependence on renal dialysis: Secondary | ICD-10-CM | POA: Diagnosis not present

## 2019-02-04 DIAGNOSIS — D689 Coagulation defect, unspecified: Secondary | ICD-10-CM | POA: Diagnosis not present

## 2019-02-04 DIAGNOSIS — Z23 Encounter for immunization: Secondary | ICD-10-CM | POA: Diagnosis not present

## 2019-02-04 DIAGNOSIS — D631 Anemia in chronic kidney disease: Secondary | ICD-10-CM | POA: Diagnosis not present

## 2019-02-04 DIAGNOSIS — N186 End stage renal disease: Secondary | ICD-10-CM | POA: Diagnosis not present

## 2019-02-04 DIAGNOSIS — Z992 Dependence on renal dialysis: Secondary | ICD-10-CM | POA: Diagnosis not present

## 2019-02-04 DIAGNOSIS — N2581 Secondary hyperparathyroidism of renal origin: Secondary | ICD-10-CM | POA: Diagnosis not present

## 2019-02-07 DIAGNOSIS — N186 End stage renal disease: Secondary | ICD-10-CM | POA: Diagnosis not present

## 2019-02-07 DIAGNOSIS — Z23 Encounter for immunization: Secondary | ICD-10-CM | POA: Diagnosis not present

## 2019-02-07 DIAGNOSIS — Z992 Dependence on renal dialysis: Secondary | ICD-10-CM | POA: Diagnosis not present

## 2019-02-07 DIAGNOSIS — N2581 Secondary hyperparathyroidism of renal origin: Secondary | ICD-10-CM | POA: Diagnosis not present

## 2019-02-07 DIAGNOSIS — D631 Anemia in chronic kidney disease: Secondary | ICD-10-CM | POA: Diagnosis not present

## 2019-02-07 DIAGNOSIS — D689 Coagulation defect, unspecified: Secondary | ICD-10-CM | POA: Diagnosis not present

## 2019-02-09 DIAGNOSIS — Z992 Dependence on renal dialysis: Secondary | ICD-10-CM | POA: Diagnosis not present

## 2019-02-09 DIAGNOSIS — D689 Coagulation defect, unspecified: Secondary | ICD-10-CM | POA: Diagnosis not present

## 2019-02-09 DIAGNOSIS — N186 End stage renal disease: Secondary | ICD-10-CM | POA: Diagnosis not present

## 2019-02-09 DIAGNOSIS — N2581 Secondary hyperparathyroidism of renal origin: Secondary | ICD-10-CM | POA: Diagnosis not present

## 2019-02-09 DIAGNOSIS — Z23 Encounter for immunization: Secondary | ICD-10-CM | POA: Diagnosis not present

## 2019-02-09 DIAGNOSIS — D631 Anemia in chronic kidney disease: Secondary | ICD-10-CM | POA: Diagnosis not present

## 2019-02-11 DIAGNOSIS — D689 Coagulation defect, unspecified: Secondary | ICD-10-CM | POA: Diagnosis not present

## 2019-02-11 DIAGNOSIS — D631 Anemia in chronic kidney disease: Secondary | ICD-10-CM | POA: Diagnosis not present

## 2019-02-11 DIAGNOSIS — Z992 Dependence on renal dialysis: Secondary | ICD-10-CM | POA: Diagnosis not present

## 2019-02-11 DIAGNOSIS — N186 End stage renal disease: Secondary | ICD-10-CM | POA: Diagnosis not present

## 2019-02-11 DIAGNOSIS — N2581 Secondary hyperparathyroidism of renal origin: Secondary | ICD-10-CM | POA: Diagnosis not present

## 2019-02-11 DIAGNOSIS — Z23 Encounter for immunization: Secondary | ICD-10-CM | POA: Diagnosis not present

## 2019-02-14 DIAGNOSIS — N2581 Secondary hyperparathyroidism of renal origin: Secondary | ICD-10-CM | POA: Diagnosis not present

## 2019-02-14 DIAGNOSIS — Z23 Encounter for immunization: Secondary | ICD-10-CM | POA: Diagnosis not present

## 2019-02-14 DIAGNOSIS — Z992 Dependence on renal dialysis: Secondary | ICD-10-CM | POA: Diagnosis not present

## 2019-02-14 DIAGNOSIS — D689 Coagulation defect, unspecified: Secondary | ICD-10-CM | POA: Diagnosis not present

## 2019-02-14 DIAGNOSIS — D631 Anemia in chronic kidney disease: Secondary | ICD-10-CM | POA: Diagnosis not present

## 2019-02-14 DIAGNOSIS — N186 End stage renal disease: Secondary | ICD-10-CM | POA: Diagnosis not present

## 2019-02-16 DIAGNOSIS — D631 Anemia in chronic kidney disease: Secondary | ICD-10-CM | POA: Diagnosis not present

## 2019-02-16 DIAGNOSIS — N2581 Secondary hyperparathyroidism of renal origin: Secondary | ICD-10-CM | POA: Diagnosis not present

## 2019-02-16 DIAGNOSIS — Z992 Dependence on renal dialysis: Secondary | ICD-10-CM | POA: Diagnosis not present

## 2019-02-16 DIAGNOSIS — D689 Coagulation defect, unspecified: Secondary | ICD-10-CM | POA: Diagnosis not present

## 2019-02-16 DIAGNOSIS — Z23 Encounter for immunization: Secondary | ICD-10-CM | POA: Diagnosis not present

## 2019-02-16 DIAGNOSIS — N186 End stage renal disease: Secondary | ICD-10-CM | POA: Diagnosis not present

## 2019-02-18 DIAGNOSIS — D631 Anemia in chronic kidney disease: Secondary | ICD-10-CM | POA: Diagnosis not present

## 2019-02-18 DIAGNOSIS — Z23 Encounter for immunization: Secondary | ICD-10-CM | POA: Diagnosis not present

## 2019-02-18 DIAGNOSIS — N2581 Secondary hyperparathyroidism of renal origin: Secondary | ICD-10-CM | POA: Diagnosis not present

## 2019-02-18 DIAGNOSIS — D689 Coagulation defect, unspecified: Secondary | ICD-10-CM | POA: Diagnosis not present

## 2019-02-18 DIAGNOSIS — Z992 Dependence on renal dialysis: Secondary | ICD-10-CM | POA: Diagnosis not present

## 2019-02-18 DIAGNOSIS — N186 End stage renal disease: Secondary | ICD-10-CM | POA: Diagnosis not present

## 2019-02-21 DIAGNOSIS — D689 Coagulation defect, unspecified: Secondary | ICD-10-CM | POA: Diagnosis not present

## 2019-02-21 DIAGNOSIS — Z992 Dependence on renal dialysis: Secondary | ICD-10-CM | POA: Diagnosis not present

## 2019-02-21 DIAGNOSIS — N186 End stage renal disease: Secondary | ICD-10-CM | POA: Diagnosis not present

## 2019-02-21 DIAGNOSIS — Z23 Encounter for immunization: Secondary | ICD-10-CM | POA: Diagnosis not present

## 2019-02-21 DIAGNOSIS — N2581 Secondary hyperparathyroidism of renal origin: Secondary | ICD-10-CM | POA: Diagnosis not present

## 2019-02-21 DIAGNOSIS — D631 Anemia in chronic kidney disease: Secondary | ICD-10-CM | POA: Diagnosis not present

## 2019-02-22 DIAGNOSIS — E789 Disorder of lipoprotein metabolism, unspecified: Secondary | ICD-10-CM | POA: Diagnosis not present

## 2019-02-23 DIAGNOSIS — Z23 Encounter for immunization: Secondary | ICD-10-CM | POA: Diagnosis not present

## 2019-02-23 DIAGNOSIS — Z992 Dependence on renal dialysis: Secondary | ICD-10-CM | POA: Diagnosis not present

## 2019-02-23 DIAGNOSIS — D689 Coagulation defect, unspecified: Secondary | ICD-10-CM | POA: Diagnosis not present

## 2019-02-23 DIAGNOSIS — N2581 Secondary hyperparathyroidism of renal origin: Secondary | ICD-10-CM | POA: Diagnosis not present

## 2019-02-23 DIAGNOSIS — D631 Anemia in chronic kidney disease: Secondary | ICD-10-CM | POA: Diagnosis not present

## 2019-02-23 DIAGNOSIS — N186 End stage renal disease: Secondary | ICD-10-CM | POA: Diagnosis not present

## 2019-02-25 DIAGNOSIS — D631 Anemia in chronic kidney disease: Secondary | ICD-10-CM | POA: Diagnosis not present

## 2019-02-25 DIAGNOSIS — N186 End stage renal disease: Secondary | ICD-10-CM | POA: Diagnosis not present

## 2019-02-25 DIAGNOSIS — Z23 Encounter for immunization: Secondary | ICD-10-CM | POA: Diagnosis not present

## 2019-02-25 DIAGNOSIS — D689 Coagulation defect, unspecified: Secondary | ICD-10-CM | POA: Diagnosis not present

## 2019-02-25 DIAGNOSIS — N2581 Secondary hyperparathyroidism of renal origin: Secondary | ICD-10-CM | POA: Diagnosis not present

## 2019-02-25 DIAGNOSIS — Z992 Dependence on renal dialysis: Secondary | ICD-10-CM | POA: Diagnosis not present

## 2019-02-28 DIAGNOSIS — D689 Coagulation defect, unspecified: Secondary | ICD-10-CM | POA: Diagnosis not present

## 2019-02-28 DIAGNOSIS — D631 Anemia in chronic kidney disease: Secondary | ICD-10-CM | POA: Diagnosis not present

## 2019-02-28 DIAGNOSIS — Z992 Dependence on renal dialysis: Secondary | ICD-10-CM | POA: Diagnosis not present

## 2019-02-28 DIAGNOSIS — Z23 Encounter for immunization: Secondary | ICD-10-CM | POA: Diagnosis not present

## 2019-02-28 DIAGNOSIS — N186 End stage renal disease: Secondary | ICD-10-CM | POA: Diagnosis not present

## 2019-02-28 DIAGNOSIS — N2581 Secondary hyperparathyroidism of renal origin: Secondary | ICD-10-CM | POA: Diagnosis not present

## 2019-03-01 DIAGNOSIS — E1122 Type 2 diabetes mellitus with diabetic chronic kidney disease: Secondary | ICD-10-CM | POA: Diagnosis not present

## 2019-03-01 DIAGNOSIS — E7800 Pure hypercholesterolemia, unspecified: Secondary | ICD-10-CM | POA: Diagnosis not present

## 2019-03-01 DIAGNOSIS — I10 Essential (primary) hypertension: Secondary | ICD-10-CM | POA: Diagnosis not present

## 2019-03-01 DIAGNOSIS — Z992 Dependence on renal dialysis: Secondary | ICD-10-CM | POA: Diagnosis not present

## 2019-03-01 DIAGNOSIS — G4733 Obstructive sleep apnea (adult) (pediatric): Secondary | ICD-10-CM | POA: Diagnosis not present

## 2019-03-02 DIAGNOSIS — D631 Anemia in chronic kidney disease: Secondary | ICD-10-CM | POA: Diagnosis not present

## 2019-03-02 DIAGNOSIS — I129 Hypertensive chronic kidney disease with stage 1 through stage 4 chronic kidney disease, or unspecified chronic kidney disease: Secondary | ICD-10-CM | POA: Diagnosis not present

## 2019-03-02 DIAGNOSIS — Z992 Dependence on renal dialysis: Secondary | ICD-10-CM | POA: Diagnosis not present

## 2019-03-02 DIAGNOSIS — Z23 Encounter for immunization: Secondary | ICD-10-CM | POA: Diagnosis not present

## 2019-03-02 DIAGNOSIS — N2581 Secondary hyperparathyroidism of renal origin: Secondary | ICD-10-CM | POA: Diagnosis not present

## 2019-03-02 DIAGNOSIS — D689 Coagulation defect, unspecified: Secondary | ICD-10-CM | POA: Diagnosis not present

## 2019-03-02 DIAGNOSIS — N186 End stage renal disease: Secondary | ICD-10-CM | POA: Diagnosis not present

## 2019-03-04 DIAGNOSIS — N186 End stage renal disease: Secondary | ICD-10-CM | POA: Diagnosis not present

## 2019-03-04 DIAGNOSIS — D631 Anemia in chronic kidney disease: Secondary | ICD-10-CM | POA: Diagnosis not present

## 2019-03-04 DIAGNOSIS — Z992 Dependence on renal dialysis: Secondary | ICD-10-CM | POA: Diagnosis not present

## 2019-03-04 DIAGNOSIS — E1122 Type 2 diabetes mellitus with diabetic chronic kidney disease: Secondary | ICD-10-CM | POA: Diagnosis not present

## 2019-03-04 DIAGNOSIS — D689 Coagulation defect, unspecified: Secondary | ICD-10-CM | POA: Diagnosis not present

## 2019-03-04 DIAGNOSIS — N2581 Secondary hyperparathyroidism of renal origin: Secondary | ICD-10-CM | POA: Diagnosis not present

## 2019-03-07 DIAGNOSIS — D689 Coagulation defect, unspecified: Secondary | ICD-10-CM | POA: Diagnosis not present

## 2019-03-07 DIAGNOSIS — N186 End stage renal disease: Secondary | ICD-10-CM | POA: Diagnosis not present

## 2019-03-07 DIAGNOSIS — N2581 Secondary hyperparathyroidism of renal origin: Secondary | ICD-10-CM | POA: Diagnosis not present

## 2019-03-07 DIAGNOSIS — Z992 Dependence on renal dialysis: Secondary | ICD-10-CM | POA: Diagnosis not present

## 2019-03-07 DIAGNOSIS — D631 Anemia in chronic kidney disease: Secondary | ICD-10-CM | POA: Diagnosis not present

## 2019-03-07 DIAGNOSIS — E1122 Type 2 diabetes mellitus with diabetic chronic kidney disease: Secondary | ICD-10-CM | POA: Diagnosis not present

## 2019-03-09 DIAGNOSIS — N186 End stage renal disease: Secondary | ICD-10-CM | POA: Diagnosis not present

## 2019-03-09 DIAGNOSIS — D631 Anemia in chronic kidney disease: Secondary | ICD-10-CM | POA: Diagnosis not present

## 2019-03-09 DIAGNOSIS — Z992 Dependence on renal dialysis: Secondary | ICD-10-CM | POA: Diagnosis not present

## 2019-03-09 DIAGNOSIS — N2581 Secondary hyperparathyroidism of renal origin: Secondary | ICD-10-CM | POA: Diagnosis not present

## 2019-03-09 DIAGNOSIS — D689 Coagulation defect, unspecified: Secondary | ICD-10-CM | POA: Diagnosis not present

## 2019-03-09 DIAGNOSIS — E1122 Type 2 diabetes mellitus with diabetic chronic kidney disease: Secondary | ICD-10-CM | POA: Diagnosis not present

## 2019-03-11 DIAGNOSIS — D631 Anemia in chronic kidney disease: Secondary | ICD-10-CM | POA: Diagnosis not present

## 2019-03-11 DIAGNOSIS — N2581 Secondary hyperparathyroidism of renal origin: Secondary | ICD-10-CM | POA: Diagnosis not present

## 2019-03-11 DIAGNOSIS — N186 End stage renal disease: Secondary | ICD-10-CM | POA: Diagnosis not present

## 2019-03-11 DIAGNOSIS — D689 Coagulation defect, unspecified: Secondary | ICD-10-CM | POA: Diagnosis not present

## 2019-03-11 DIAGNOSIS — E1122 Type 2 diabetes mellitus with diabetic chronic kidney disease: Secondary | ICD-10-CM | POA: Diagnosis not present

## 2019-03-11 DIAGNOSIS — Z992 Dependence on renal dialysis: Secondary | ICD-10-CM | POA: Diagnosis not present

## 2019-03-14 DIAGNOSIS — E1122 Type 2 diabetes mellitus with diabetic chronic kidney disease: Secondary | ICD-10-CM | POA: Diagnosis not present

## 2019-03-14 DIAGNOSIS — D689 Coagulation defect, unspecified: Secondary | ICD-10-CM | POA: Diagnosis not present

## 2019-03-14 DIAGNOSIS — D631 Anemia in chronic kidney disease: Secondary | ICD-10-CM | POA: Diagnosis not present

## 2019-03-14 DIAGNOSIS — N186 End stage renal disease: Secondary | ICD-10-CM | POA: Diagnosis not present

## 2019-03-14 DIAGNOSIS — Z992 Dependence on renal dialysis: Secondary | ICD-10-CM | POA: Diagnosis not present

## 2019-03-14 DIAGNOSIS — N2581 Secondary hyperparathyroidism of renal origin: Secondary | ICD-10-CM | POA: Diagnosis not present

## 2019-03-16 DIAGNOSIS — D689 Coagulation defect, unspecified: Secondary | ICD-10-CM | POA: Diagnosis not present

## 2019-03-16 DIAGNOSIS — D631 Anemia in chronic kidney disease: Secondary | ICD-10-CM | POA: Diagnosis not present

## 2019-03-16 DIAGNOSIS — N2581 Secondary hyperparathyroidism of renal origin: Secondary | ICD-10-CM | POA: Diagnosis not present

## 2019-03-16 DIAGNOSIS — Z992 Dependence on renal dialysis: Secondary | ICD-10-CM | POA: Diagnosis not present

## 2019-03-16 DIAGNOSIS — E1122 Type 2 diabetes mellitus with diabetic chronic kidney disease: Secondary | ICD-10-CM | POA: Diagnosis not present

## 2019-03-16 DIAGNOSIS — N186 End stage renal disease: Secondary | ICD-10-CM | POA: Diagnosis not present

## 2019-03-18 DIAGNOSIS — N2581 Secondary hyperparathyroidism of renal origin: Secondary | ICD-10-CM | POA: Diagnosis not present

## 2019-03-18 DIAGNOSIS — D631 Anemia in chronic kidney disease: Secondary | ICD-10-CM | POA: Diagnosis not present

## 2019-03-18 DIAGNOSIS — E1122 Type 2 diabetes mellitus with diabetic chronic kidney disease: Secondary | ICD-10-CM | POA: Diagnosis not present

## 2019-03-18 DIAGNOSIS — Z992 Dependence on renal dialysis: Secondary | ICD-10-CM | POA: Diagnosis not present

## 2019-03-18 DIAGNOSIS — D689 Coagulation defect, unspecified: Secondary | ICD-10-CM | POA: Diagnosis not present

## 2019-03-18 DIAGNOSIS — N186 End stage renal disease: Secondary | ICD-10-CM | POA: Diagnosis not present

## 2019-03-21 DIAGNOSIS — E1122 Type 2 diabetes mellitus with diabetic chronic kidney disease: Secondary | ICD-10-CM | POA: Diagnosis not present

## 2019-03-21 DIAGNOSIS — D689 Coagulation defect, unspecified: Secondary | ICD-10-CM | POA: Diagnosis not present

## 2019-03-21 DIAGNOSIS — D631 Anemia in chronic kidney disease: Secondary | ICD-10-CM | POA: Diagnosis not present

## 2019-03-21 DIAGNOSIS — Z992 Dependence on renal dialysis: Secondary | ICD-10-CM | POA: Diagnosis not present

## 2019-03-21 DIAGNOSIS — N2581 Secondary hyperparathyroidism of renal origin: Secondary | ICD-10-CM | POA: Diagnosis not present

## 2019-03-21 DIAGNOSIS — N186 End stage renal disease: Secondary | ICD-10-CM | POA: Diagnosis not present

## 2019-03-23 DIAGNOSIS — Z992 Dependence on renal dialysis: Secondary | ICD-10-CM | POA: Diagnosis not present

## 2019-03-23 DIAGNOSIS — D631 Anemia in chronic kidney disease: Secondary | ICD-10-CM | POA: Diagnosis not present

## 2019-03-23 DIAGNOSIS — N2581 Secondary hyperparathyroidism of renal origin: Secondary | ICD-10-CM | POA: Diagnosis not present

## 2019-03-23 DIAGNOSIS — E1122 Type 2 diabetes mellitus with diabetic chronic kidney disease: Secondary | ICD-10-CM | POA: Diagnosis not present

## 2019-03-23 DIAGNOSIS — D689 Coagulation defect, unspecified: Secondary | ICD-10-CM | POA: Diagnosis not present

## 2019-03-23 DIAGNOSIS — N186 End stage renal disease: Secondary | ICD-10-CM | POA: Diagnosis not present

## 2019-03-25 DIAGNOSIS — Z992 Dependence on renal dialysis: Secondary | ICD-10-CM | POA: Diagnosis not present

## 2019-03-25 DIAGNOSIS — D689 Coagulation defect, unspecified: Secondary | ICD-10-CM | POA: Diagnosis not present

## 2019-03-25 DIAGNOSIS — N2581 Secondary hyperparathyroidism of renal origin: Secondary | ICD-10-CM | POA: Diagnosis not present

## 2019-03-25 DIAGNOSIS — E1122 Type 2 diabetes mellitus with diabetic chronic kidney disease: Secondary | ICD-10-CM | POA: Diagnosis not present

## 2019-03-25 DIAGNOSIS — D631 Anemia in chronic kidney disease: Secondary | ICD-10-CM | POA: Diagnosis not present

## 2019-03-25 DIAGNOSIS — N186 End stage renal disease: Secondary | ICD-10-CM | POA: Diagnosis not present

## 2019-03-28 DIAGNOSIS — D689 Coagulation defect, unspecified: Secondary | ICD-10-CM | POA: Diagnosis not present

## 2019-03-28 DIAGNOSIS — E1122 Type 2 diabetes mellitus with diabetic chronic kidney disease: Secondary | ICD-10-CM | POA: Diagnosis not present

## 2019-03-28 DIAGNOSIS — D631 Anemia in chronic kidney disease: Secondary | ICD-10-CM | POA: Diagnosis not present

## 2019-03-28 DIAGNOSIS — Z992 Dependence on renal dialysis: Secondary | ICD-10-CM | POA: Diagnosis not present

## 2019-03-28 DIAGNOSIS — N2581 Secondary hyperparathyroidism of renal origin: Secondary | ICD-10-CM | POA: Diagnosis not present

## 2019-03-28 DIAGNOSIS — N186 End stage renal disease: Secondary | ICD-10-CM | POA: Diagnosis not present

## 2019-03-30 DIAGNOSIS — D689 Coagulation defect, unspecified: Secondary | ICD-10-CM | POA: Diagnosis not present

## 2019-03-30 DIAGNOSIS — D631 Anemia in chronic kidney disease: Secondary | ICD-10-CM | POA: Diagnosis not present

## 2019-03-30 DIAGNOSIS — Z992 Dependence on renal dialysis: Secondary | ICD-10-CM | POA: Diagnosis not present

## 2019-03-30 DIAGNOSIS — E1122 Type 2 diabetes mellitus with diabetic chronic kidney disease: Secondary | ICD-10-CM | POA: Diagnosis not present

## 2019-03-30 DIAGNOSIS — N2581 Secondary hyperparathyroidism of renal origin: Secondary | ICD-10-CM | POA: Diagnosis not present

## 2019-03-30 DIAGNOSIS — N186 End stage renal disease: Secondary | ICD-10-CM | POA: Diagnosis not present

## 2019-04-01 DIAGNOSIS — D689 Coagulation defect, unspecified: Secondary | ICD-10-CM | POA: Diagnosis not present

## 2019-04-01 DIAGNOSIS — N2581 Secondary hyperparathyroidism of renal origin: Secondary | ICD-10-CM | POA: Diagnosis not present

## 2019-04-01 DIAGNOSIS — Z992 Dependence on renal dialysis: Secondary | ICD-10-CM | POA: Diagnosis not present

## 2019-04-01 DIAGNOSIS — N186 End stage renal disease: Secondary | ICD-10-CM | POA: Diagnosis not present

## 2019-04-01 DIAGNOSIS — E1122 Type 2 diabetes mellitus with diabetic chronic kidney disease: Secondary | ICD-10-CM | POA: Diagnosis not present

## 2019-04-01 DIAGNOSIS — D631 Anemia in chronic kidney disease: Secondary | ICD-10-CM | POA: Diagnosis not present

## 2019-04-02 DIAGNOSIS — I129 Hypertensive chronic kidney disease with stage 1 through stage 4 chronic kidney disease, or unspecified chronic kidney disease: Secondary | ICD-10-CM | POA: Diagnosis not present

## 2019-04-02 DIAGNOSIS — N186 End stage renal disease: Secondary | ICD-10-CM | POA: Diagnosis not present

## 2019-04-02 DIAGNOSIS — Z992 Dependence on renal dialysis: Secondary | ICD-10-CM | POA: Diagnosis not present

## 2019-04-04 DIAGNOSIS — N2581 Secondary hyperparathyroidism of renal origin: Secondary | ICD-10-CM | POA: Diagnosis not present

## 2019-04-04 DIAGNOSIS — D631 Anemia in chronic kidney disease: Secondary | ICD-10-CM | POA: Diagnosis not present

## 2019-04-04 DIAGNOSIS — N186 End stage renal disease: Secondary | ICD-10-CM | POA: Diagnosis not present

## 2019-04-04 DIAGNOSIS — D689 Coagulation defect, unspecified: Secondary | ICD-10-CM | POA: Diagnosis not present

## 2019-04-04 DIAGNOSIS — E875 Hyperkalemia: Secondary | ICD-10-CM | POA: Diagnosis not present

## 2019-04-04 DIAGNOSIS — Z992 Dependence on renal dialysis: Secondary | ICD-10-CM | POA: Diagnosis not present

## 2019-04-06 DIAGNOSIS — N2581 Secondary hyperparathyroidism of renal origin: Secondary | ICD-10-CM | POA: Diagnosis not present

## 2019-04-06 DIAGNOSIS — D631 Anemia in chronic kidney disease: Secondary | ICD-10-CM | POA: Diagnosis not present

## 2019-04-06 DIAGNOSIS — D689 Coagulation defect, unspecified: Secondary | ICD-10-CM | POA: Diagnosis not present

## 2019-04-06 DIAGNOSIS — N186 End stage renal disease: Secondary | ICD-10-CM | POA: Diagnosis not present

## 2019-04-06 DIAGNOSIS — E875 Hyperkalemia: Secondary | ICD-10-CM | POA: Diagnosis not present

## 2019-04-06 DIAGNOSIS — Z992 Dependence on renal dialysis: Secondary | ICD-10-CM | POA: Diagnosis not present

## 2019-04-08 DIAGNOSIS — D689 Coagulation defect, unspecified: Secondary | ICD-10-CM | POA: Diagnosis not present

## 2019-04-08 DIAGNOSIS — D631 Anemia in chronic kidney disease: Secondary | ICD-10-CM | POA: Diagnosis not present

## 2019-04-08 DIAGNOSIS — Z992 Dependence on renal dialysis: Secondary | ICD-10-CM | POA: Diagnosis not present

## 2019-04-08 DIAGNOSIS — N186 End stage renal disease: Secondary | ICD-10-CM | POA: Diagnosis not present

## 2019-04-08 DIAGNOSIS — N2581 Secondary hyperparathyroidism of renal origin: Secondary | ICD-10-CM | POA: Diagnosis not present

## 2019-04-08 DIAGNOSIS — E875 Hyperkalemia: Secondary | ICD-10-CM | POA: Diagnosis not present

## 2019-04-11 DIAGNOSIS — E875 Hyperkalemia: Secondary | ICD-10-CM | POA: Diagnosis not present

## 2019-04-11 DIAGNOSIS — D631 Anemia in chronic kidney disease: Secondary | ICD-10-CM | POA: Diagnosis not present

## 2019-04-11 DIAGNOSIS — D689 Coagulation defect, unspecified: Secondary | ICD-10-CM | POA: Diagnosis not present

## 2019-04-11 DIAGNOSIS — Z992 Dependence on renal dialysis: Secondary | ICD-10-CM | POA: Diagnosis not present

## 2019-04-11 DIAGNOSIS — N2581 Secondary hyperparathyroidism of renal origin: Secondary | ICD-10-CM | POA: Diagnosis not present

## 2019-04-11 DIAGNOSIS — N186 End stage renal disease: Secondary | ICD-10-CM | POA: Diagnosis not present

## 2019-04-13 DIAGNOSIS — Z992 Dependence on renal dialysis: Secondary | ICD-10-CM | POA: Diagnosis not present

## 2019-04-13 DIAGNOSIS — D689 Coagulation defect, unspecified: Secondary | ICD-10-CM | POA: Diagnosis not present

## 2019-04-13 DIAGNOSIS — D631 Anemia in chronic kidney disease: Secondary | ICD-10-CM | POA: Diagnosis not present

## 2019-04-13 DIAGNOSIS — E875 Hyperkalemia: Secondary | ICD-10-CM | POA: Diagnosis not present

## 2019-04-13 DIAGNOSIS — N186 End stage renal disease: Secondary | ICD-10-CM | POA: Diagnosis not present

## 2019-04-13 DIAGNOSIS — N2581 Secondary hyperparathyroidism of renal origin: Secondary | ICD-10-CM | POA: Diagnosis not present

## 2019-04-15 DIAGNOSIS — E875 Hyperkalemia: Secondary | ICD-10-CM | POA: Diagnosis not present

## 2019-04-15 DIAGNOSIS — N186 End stage renal disease: Secondary | ICD-10-CM | POA: Diagnosis not present

## 2019-04-15 DIAGNOSIS — D689 Coagulation defect, unspecified: Secondary | ICD-10-CM | POA: Diagnosis not present

## 2019-04-15 DIAGNOSIS — D631 Anemia in chronic kidney disease: Secondary | ICD-10-CM | POA: Diagnosis not present

## 2019-04-15 DIAGNOSIS — Z992 Dependence on renal dialysis: Secondary | ICD-10-CM | POA: Diagnosis not present

## 2019-04-15 DIAGNOSIS — N2581 Secondary hyperparathyroidism of renal origin: Secondary | ICD-10-CM | POA: Diagnosis not present

## 2019-04-18 DIAGNOSIS — E875 Hyperkalemia: Secondary | ICD-10-CM | POA: Diagnosis not present

## 2019-04-18 DIAGNOSIS — D631 Anemia in chronic kidney disease: Secondary | ICD-10-CM | POA: Diagnosis not present

## 2019-04-18 DIAGNOSIS — N186 End stage renal disease: Secondary | ICD-10-CM | POA: Diagnosis not present

## 2019-04-18 DIAGNOSIS — Z992 Dependence on renal dialysis: Secondary | ICD-10-CM | POA: Diagnosis not present

## 2019-04-18 DIAGNOSIS — D689 Coagulation defect, unspecified: Secondary | ICD-10-CM | POA: Diagnosis not present

## 2019-04-18 DIAGNOSIS — N2581 Secondary hyperparathyroidism of renal origin: Secondary | ICD-10-CM | POA: Diagnosis not present

## 2019-04-20 DIAGNOSIS — E875 Hyperkalemia: Secondary | ICD-10-CM | POA: Diagnosis not present

## 2019-04-20 DIAGNOSIS — D689 Coagulation defect, unspecified: Secondary | ICD-10-CM | POA: Diagnosis not present

## 2019-04-20 DIAGNOSIS — Z992 Dependence on renal dialysis: Secondary | ICD-10-CM | POA: Diagnosis not present

## 2019-04-20 DIAGNOSIS — D631 Anemia in chronic kidney disease: Secondary | ICD-10-CM | POA: Diagnosis not present

## 2019-04-20 DIAGNOSIS — N2581 Secondary hyperparathyroidism of renal origin: Secondary | ICD-10-CM | POA: Diagnosis not present

## 2019-04-20 DIAGNOSIS — N186 End stage renal disease: Secondary | ICD-10-CM | POA: Diagnosis not present

## 2019-04-22 DIAGNOSIS — D631 Anemia in chronic kidney disease: Secondary | ICD-10-CM | POA: Diagnosis not present

## 2019-04-22 DIAGNOSIS — N186 End stage renal disease: Secondary | ICD-10-CM | POA: Diagnosis not present

## 2019-04-22 DIAGNOSIS — N2581 Secondary hyperparathyroidism of renal origin: Secondary | ICD-10-CM | POA: Diagnosis not present

## 2019-04-22 DIAGNOSIS — E875 Hyperkalemia: Secondary | ICD-10-CM | POA: Diagnosis not present

## 2019-04-22 DIAGNOSIS — D689 Coagulation defect, unspecified: Secondary | ICD-10-CM | POA: Diagnosis not present

## 2019-04-22 DIAGNOSIS — Z992 Dependence on renal dialysis: Secondary | ICD-10-CM | POA: Diagnosis not present

## 2019-04-24 DIAGNOSIS — D689 Coagulation defect, unspecified: Secondary | ICD-10-CM | POA: Diagnosis not present

## 2019-04-24 DIAGNOSIS — Z992 Dependence on renal dialysis: Secondary | ICD-10-CM | POA: Diagnosis not present

## 2019-04-24 DIAGNOSIS — N186 End stage renal disease: Secondary | ICD-10-CM | POA: Diagnosis not present

## 2019-04-24 DIAGNOSIS — N2581 Secondary hyperparathyroidism of renal origin: Secondary | ICD-10-CM | POA: Diagnosis not present

## 2019-04-24 DIAGNOSIS — E875 Hyperkalemia: Secondary | ICD-10-CM | POA: Diagnosis not present

## 2019-04-24 DIAGNOSIS — D631 Anemia in chronic kidney disease: Secondary | ICD-10-CM | POA: Diagnosis not present

## 2019-04-26 DIAGNOSIS — D689 Coagulation defect, unspecified: Secondary | ICD-10-CM | POA: Diagnosis not present

## 2019-04-26 DIAGNOSIS — E875 Hyperkalemia: Secondary | ICD-10-CM | POA: Diagnosis not present

## 2019-04-26 DIAGNOSIS — N186 End stage renal disease: Secondary | ICD-10-CM | POA: Diagnosis not present

## 2019-04-26 DIAGNOSIS — D631 Anemia in chronic kidney disease: Secondary | ICD-10-CM | POA: Diagnosis not present

## 2019-04-26 DIAGNOSIS — Z992 Dependence on renal dialysis: Secondary | ICD-10-CM | POA: Diagnosis not present

## 2019-04-26 DIAGNOSIS — N2581 Secondary hyperparathyroidism of renal origin: Secondary | ICD-10-CM | POA: Diagnosis not present

## 2019-04-29 DIAGNOSIS — E875 Hyperkalemia: Secondary | ICD-10-CM | POA: Diagnosis not present

## 2019-04-29 DIAGNOSIS — N186 End stage renal disease: Secondary | ICD-10-CM | POA: Diagnosis not present

## 2019-04-29 DIAGNOSIS — D689 Coagulation defect, unspecified: Secondary | ICD-10-CM | POA: Diagnosis not present

## 2019-04-29 DIAGNOSIS — D631 Anemia in chronic kidney disease: Secondary | ICD-10-CM | POA: Diagnosis not present

## 2019-04-29 DIAGNOSIS — N2581 Secondary hyperparathyroidism of renal origin: Secondary | ICD-10-CM | POA: Diagnosis not present

## 2019-04-29 DIAGNOSIS — Z992 Dependence on renal dialysis: Secondary | ICD-10-CM | POA: Diagnosis not present

## 2019-05-02 DIAGNOSIS — E875 Hyperkalemia: Secondary | ICD-10-CM | POA: Diagnosis not present

## 2019-05-02 DIAGNOSIS — N186 End stage renal disease: Secondary | ICD-10-CM | POA: Diagnosis not present

## 2019-05-02 DIAGNOSIS — N2581 Secondary hyperparathyroidism of renal origin: Secondary | ICD-10-CM | POA: Diagnosis not present

## 2019-05-02 DIAGNOSIS — D689 Coagulation defect, unspecified: Secondary | ICD-10-CM | POA: Diagnosis not present

## 2019-05-02 DIAGNOSIS — D631 Anemia in chronic kidney disease: Secondary | ICD-10-CM | POA: Diagnosis not present

## 2019-05-02 DIAGNOSIS — I129 Hypertensive chronic kidney disease with stage 1 through stage 4 chronic kidney disease, or unspecified chronic kidney disease: Secondary | ICD-10-CM | POA: Diagnosis not present

## 2019-05-02 DIAGNOSIS — Z992 Dependence on renal dialysis: Secondary | ICD-10-CM | POA: Diagnosis not present

## 2019-05-03 DIAGNOSIS — I10 Essential (primary) hypertension: Secondary | ICD-10-CM | POA: Diagnosis not present

## 2019-05-03 DIAGNOSIS — Z8739 Personal history of other diseases of the musculoskeletal system and connective tissue: Secondary | ICD-10-CM | POA: Diagnosis not present

## 2019-05-03 DIAGNOSIS — E1122 Type 2 diabetes mellitus with diabetic chronic kidney disease: Secondary | ICD-10-CM | POA: Diagnosis not present

## 2019-05-03 DIAGNOSIS — E7800 Pure hypercholesterolemia, unspecified: Secondary | ICD-10-CM | POA: Diagnosis not present

## 2019-05-04 DIAGNOSIS — D631 Anemia in chronic kidney disease: Secondary | ICD-10-CM | POA: Diagnosis not present

## 2019-05-04 DIAGNOSIS — D689 Coagulation defect, unspecified: Secondary | ICD-10-CM | POA: Diagnosis not present

## 2019-05-04 DIAGNOSIS — N186 End stage renal disease: Secondary | ICD-10-CM | POA: Diagnosis not present

## 2019-05-04 DIAGNOSIS — N2581 Secondary hyperparathyroidism of renal origin: Secondary | ICD-10-CM | POA: Diagnosis not present

## 2019-05-04 DIAGNOSIS — Z992 Dependence on renal dialysis: Secondary | ICD-10-CM | POA: Diagnosis not present

## 2019-05-06 DIAGNOSIS — Z992 Dependence on renal dialysis: Secondary | ICD-10-CM | POA: Diagnosis not present

## 2019-05-06 DIAGNOSIS — N186 End stage renal disease: Secondary | ICD-10-CM | POA: Diagnosis not present

## 2019-05-06 DIAGNOSIS — D689 Coagulation defect, unspecified: Secondary | ICD-10-CM | POA: Diagnosis not present

## 2019-05-06 DIAGNOSIS — D631 Anemia in chronic kidney disease: Secondary | ICD-10-CM | POA: Diagnosis not present

## 2019-05-06 DIAGNOSIS — N2581 Secondary hyperparathyroidism of renal origin: Secondary | ICD-10-CM | POA: Diagnosis not present

## 2019-05-09 DIAGNOSIS — Z992 Dependence on renal dialysis: Secondary | ICD-10-CM | POA: Diagnosis not present

## 2019-05-09 DIAGNOSIS — N2581 Secondary hyperparathyroidism of renal origin: Secondary | ICD-10-CM | POA: Diagnosis not present

## 2019-05-09 DIAGNOSIS — D689 Coagulation defect, unspecified: Secondary | ICD-10-CM | POA: Diagnosis not present

## 2019-05-09 DIAGNOSIS — N186 End stage renal disease: Secondary | ICD-10-CM | POA: Diagnosis not present

## 2019-05-09 DIAGNOSIS — D631 Anemia in chronic kidney disease: Secondary | ICD-10-CM | POA: Diagnosis not present

## 2019-05-11 DIAGNOSIS — N186 End stage renal disease: Secondary | ICD-10-CM | POA: Diagnosis not present

## 2019-05-11 DIAGNOSIS — D631 Anemia in chronic kidney disease: Secondary | ICD-10-CM | POA: Diagnosis not present

## 2019-05-11 DIAGNOSIS — D689 Coagulation defect, unspecified: Secondary | ICD-10-CM | POA: Diagnosis not present

## 2019-05-11 DIAGNOSIS — N2581 Secondary hyperparathyroidism of renal origin: Secondary | ICD-10-CM | POA: Diagnosis not present

## 2019-05-11 DIAGNOSIS — Z992 Dependence on renal dialysis: Secondary | ICD-10-CM | POA: Diagnosis not present

## 2019-05-13 DIAGNOSIS — D689 Coagulation defect, unspecified: Secondary | ICD-10-CM | POA: Diagnosis not present

## 2019-05-13 DIAGNOSIS — D631 Anemia in chronic kidney disease: Secondary | ICD-10-CM | POA: Diagnosis not present

## 2019-05-13 DIAGNOSIS — N2581 Secondary hyperparathyroidism of renal origin: Secondary | ICD-10-CM | POA: Diagnosis not present

## 2019-05-13 DIAGNOSIS — Z992 Dependence on renal dialysis: Secondary | ICD-10-CM | POA: Diagnosis not present

## 2019-05-13 DIAGNOSIS — N186 End stage renal disease: Secondary | ICD-10-CM | POA: Diagnosis not present

## 2019-05-16 DIAGNOSIS — Z992 Dependence on renal dialysis: Secondary | ICD-10-CM | POA: Diagnosis not present

## 2019-05-16 DIAGNOSIS — D631 Anemia in chronic kidney disease: Secondary | ICD-10-CM | POA: Diagnosis not present

## 2019-05-16 DIAGNOSIS — N2581 Secondary hyperparathyroidism of renal origin: Secondary | ICD-10-CM | POA: Diagnosis not present

## 2019-05-16 DIAGNOSIS — D689 Coagulation defect, unspecified: Secondary | ICD-10-CM | POA: Diagnosis not present

## 2019-05-16 DIAGNOSIS — N186 End stage renal disease: Secondary | ICD-10-CM | POA: Diagnosis not present

## 2019-05-19 DIAGNOSIS — Z992 Dependence on renal dialysis: Secondary | ICD-10-CM | POA: Diagnosis not present

## 2019-05-19 DIAGNOSIS — D631 Anemia in chronic kidney disease: Secondary | ICD-10-CM | POA: Diagnosis not present

## 2019-05-19 DIAGNOSIS — N186 End stage renal disease: Secondary | ICD-10-CM | POA: Diagnosis not present

## 2019-05-19 DIAGNOSIS — N2581 Secondary hyperparathyroidism of renal origin: Secondary | ICD-10-CM | POA: Diagnosis not present

## 2019-05-19 DIAGNOSIS — D689 Coagulation defect, unspecified: Secondary | ICD-10-CM | POA: Diagnosis not present

## 2019-05-20 DIAGNOSIS — N2581 Secondary hyperparathyroidism of renal origin: Secondary | ICD-10-CM | POA: Diagnosis not present

## 2019-05-20 DIAGNOSIS — N186 End stage renal disease: Secondary | ICD-10-CM | POA: Diagnosis not present

## 2019-05-20 DIAGNOSIS — D631 Anemia in chronic kidney disease: Secondary | ICD-10-CM | POA: Diagnosis not present

## 2019-05-20 DIAGNOSIS — D689 Coagulation defect, unspecified: Secondary | ICD-10-CM | POA: Diagnosis not present

## 2019-05-20 DIAGNOSIS — Z992 Dependence on renal dialysis: Secondary | ICD-10-CM | POA: Diagnosis not present

## 2019-05-21 DIAGNOSIS — D689 Coagulation defect, unspecified: Secondary | ICD-10-CM | POA: Diagnosis not present

## 2019-05-21 DIAGNOSIS — N186 End stage renal disease: Secondary | ICD-10-CM | POA: Diagnosis not present

## 2019-05-21 DIAGNOSIS — Z992 Dependence on renal dialysis: Secondary | ICD-10-CM | POA: Diagnosis not present

## 2019-05-21 DIAGNOSIS — N2581 Secondary hyperparathyroidism of renal origin: Secondary | ICD-10-CM | POA: Diagnosis not present

## 2019-05-21 DIAGNOSIS — D631 Anemia in chronic kidney disease: Secondary | ICD-10-CM | POA: Diagnosis not present

## 2019-05-23 DIAGNOSIS — D631 Anemia in chronic kidney disease: Secondary | ICD-10-CM | POA: Diagnosis not present

## 2019-05-23 DIAGNOSIS — N186 End stage renal disease: Secondary | ICD-10-CM | POA: Diagnosis not present

## 2019-05-23 DIAGNOSIS — N2581 Secondary hyperparathyroidism of renal origin: Secondary | ICD-10-CM | POA: Diagnosis not present

## 2019-05-23 DIAGNOSIS — D689 Coagulation defect, unspecified: Secondary | ICD-10-CM | POA: Diagnosis not present

## 2019-05-23 DIAGNOSIS — Z992 Dependence on renal dialysis: Secondary | ICD-10-CM | POA: Diagnosis not present

## 2019-05-25 DIAGNOSIS — D631 Anemia in chronic kidney disease: Secondary | ICD-10-CM | POA: Diagnosis not present

## 2019-05-25 DIAGNOSIS — D689 Coagulation defect, unspecified: Secondary | ICD-10-CM | POA: Diagnosis not present

## 2019-05-25 DIAGNOSIS — Z992 Dependence on renal dialysis: Secondary | ICD-10-CM | POA: Diagnosis not present

## 2019-05-25 DIAGNOSIS — N186 End stage renal disease: Secondary | ICD-10-CM | POA: Diagnosis not present

## 2019-05-25 DIAGNOSIS — N2581 Secondary hyperparathyroidism of renal origin: Secondary | ICD-10-CM | POA: Diagnosis not present

## 2019-05-28 DIAGNOSIS — N2581 Secondary hyperparathyroidism of renal origin: Secondary | ICD-10-CM | POA: Diagnosis not present

## 2019-05-28 DIAGNOSIS — D689 Coagulation defect, unspecified: Secondary | ICD-10-CM | POA: Diagnosis not present

## 2019-05-28 DIAGNOSIS — D631 Anemia in chronic kidney disease: Secondary | ICD-10-CM | POA: Diagnosis not present

## 2019-05-28 DIAGNOSIS — Z992 Dependence on renal dialysis: Secondary | ICD-10-CM | POA: Diagnosis not present

## 2019-05-28 DIAGNOSIS — N186 End stage renal disease: Secondary | ICD-10-CM | POA: Diagnosis not present

## 2019-05-30 DIAGNOSIS — D689 Coagulation defect, unspecified: Secondary | ICD-10-CM | POA: Diagnosis not present

## 2019-05-30 DIAGNOSIS — D631 Anemia in chronic kidney disease: Secondary | ICD-10-CM | POA: Diagnosis not present

## 2019-05-30 DIAGNOSIS — Z992 Dependence on renal dialysis: Secondary | ICD-10-CM | POA: Diagnosis not present

## 2019-05-30 DIAGNOSIS — N186 End stage renal disease: Secondary | ICD-10-CM | POA: Diagnosis not present

## 2019-05-30 DIAGNOSIS — N2581 Secondary hyperparathyroidism of renal origin: Secondary | ICD-10-CM | POA: Diagnosis not present

## 2019-06-01 DIAGNOSIS — Z992 Dependence on renal dialysis: Secondary | ICD-10-CM | POA: Diagnosis not present

## 2019-06-01 DIAGNOSIS — N2581 Secondary hyperparathyroidism of renal origin: Secondary | ICD-10-CM | POA: Diagnosis not present

## 2019-06-01 DIAGNOSIS — D631 Anemia in chronic kidney disease: Secondary | ICD-10-CM | POA: Diagnosis not present

## 2019-06-01 DIAGNOSIS — N186 End stage renal disease: Secondary | ICD-10-CM | POA: Diagnosis not present

## 2019-06-01 DIAGNOSIS — D689 Coagulation defect, unspecified: Secondary | ICD-10-CM | POA: Diagnosis not present

## 2019-06-02 DIAGNOSIS — Z992 Dependence on renal dialysis: Secondary | ICD-10-CM | POA: Diagnosis not present

## 2019-06-02 DIAGNOSIS — N186 End stage renal disease: Secondary | ICD-10-CM | POA: Diagnosis not present

## 2019-06-02 DIAGNOSIS — I129 Hypertensive chronic kidney disease with stage 1 through stage 4 chronic kidney disease, or unspecified chronic kidney disease: Secondary | ICD-10-CM | POA: Diagnosis not present

## 2019-06-04 DIAGNOSIS — Z992 Dependence on renal dialysis: Secondary | ICD-10-CM | POA: Diagnosis not present

## 2019-06-04 DIAGNOSIS — N2581 Secondary hyperparathyroidism of renal origin: Secondary | ICD-10-CM | POA: Diagnosis not present

## 2019-06-04 DIAGNOSIS — N186 End stage renal disease: Secondary | ICD-10-CM | POA: Diagnosis not present

## 2019-06-04 DIAGNOSIS — E1122 Type 2 diabetes mellitus with diabetic chronic kidney disease: Secondary | ICD-10-CM | POA: Diagnosis not present

## 2019-06-04 DIAGNOSIS — D689 Coagulation defect, unspecified: Secondary | ICD-10-CM | POA: Diagnosis not present

## 2019-06-04 DIAGNOSIS — D631 Anemia in chronic kidney disease: Secondary | ICD-10-CM | POA: Diagnosis not present

## 2019-06-06 DIAGNOSIS — D631 Anemia in chronic kidney disease: Secondary | ICD-10-CM | POA: Diagnosis not present

## 2019-06-06 DIAGNOSIS — D689 Coagulation defect, unspecified: Secondary | ICD-10-CM | POA: Diagnosis not present

## 2019-06-06 DIAGNOSIS — N2581 Secondary hyperparathyroidism of renal origin: Secondary | ICD-10-CM | POA: Diagnosis not present

## 2019-06-06 DIAGNOSIS — N186 End stage renal disease: Secondary | ICD-10-CM | POA: Diagnosis not present

## 2019-06-06 DIAGNOSIS — E1122 Type 2 diabetes mellitus with diabetic chronic kidney disease: Secondary | ICD-10-CM | POA: Diagnosis not present

## 2019-06-06 DIAGNOSIS — Z992 Dependence on renal dialysis: Secondary | ICD-10-CM | POA: Diagnosis not present

## 2019-06-08 DIAGNOSIS — N186 End stage renal disease: Secondary | ICD-10-CM | POA: Diagnosis not present

## 2019-06-08 DIAGNOSIS — D631 Anemia in chronic kidney disease: Secondary | ICD-10-CM | POA: Diagnosis not present

## 2019-06-08 DIAGNOSIS — E1122 Type 2 diabetes mellitus with diabetic chronic kidney disease: Secondary | ICD-10-CM | POA: Diagnosis not present

## 2019-06-08 DIAGNOSIS — N2581 Secondary hyperparathyroidism of renal origin: Secondary | ICD-10-CM | POA: Diagnosis not present

## 2019-06-08 DIAGNOSIS — Z992 Dependence on renal dialysis: Secondary | ICD-10-CM | POA: Diagnosis not present

## 2019-06-08 DIAGNOSIS — D689 Coagulation defect, unspecified: Secondary | ICD-10-CM | POA: Diagnosis not present

## 2019-06-10 DIAGNOSIS — Z992 Dependence on renal dialysis: Secondary | ICD-10-CM | POA: Diagnosis not present

## 2019-06-10 DIAGNOSIS — N2581 Secondary hyperparathyroidism of renal origin: Secondary | ICD-10-CM | POA: Diagnosis not present

## 2019-06-10 DIAGNOSIS — D631 Anemia in chronic kidney disease: Secondary | ICD-10-CM | POA: Diagnosis not present

## 2019-06-10 DIAGNOSIS — D689 Coagulation defect, unspecified: Secondary | ICD-10-CM | POA: Diagnosis not present

## 2019-06-10 DIAGNOSIS — E1122 Type 2 diabetes mellitus with diabetic chronic kidney disease: Secondary | ICD-10-CM | POA: Diagnosis not present

## 2019-06-10 DIAGNOSIS — N186 End stage renal disease: Secondary | ICD-10-CM | POA: Diagnosis not present

## 2019-06-13 DIAGNOSIS — N2581 Secondary hyperparathyroidism of renal origin: Secondary | ICD-10-CM | POA: Diagnosis not present

## 2019-06-13 DIAGNOSIS — D631 Anemia in chronic kidney disease: Secondary | ICD-10-CM | POA: Diagnosis not present

## 2019-06-13 DIAGNOSIS — Z992 Dependence on renal dialysis: Secondary | ICD-10-CM | POA: Diagnosis not present

## 2019-06-13 DIAGNOSIS — N186 End stage renal disease: Secondary | ICD-10-CM | POA: Diagnosis not present

## 2019-06-13 DIAGNOSIS — D689 Coagulation defect, unspecified: Secondary | ICD-10-CM | POA: Diagnosis not present

## 2019-06-13 DIAGNOSIS — E1122 Type 2 diabetes mellitus with diabetic chronic kidney disease: Secondary | ICD-10-CM | POA: Diagnosis not present

## 2019-06-14 DIAGNOSIS — E1122 Type 2 diabetes mellitus with diabetic chronic kidney disease: Secondary | ICD-10-CM | POA: Diagnosis not present

## 2019-06-14 DIAGNOSIS — D631 Anemia in chronic kidney disease: Secondary | ICD-10-CM | POA: Diagnosis not present

## 2019-06-14 DIAGNOSIS — D689 Coagulation defect, unspecified: Secondary | ICD-10-CM | POA: Diagnosis not present

## 2019-06-14 DIAGNOSIS — Z992 Dependence on renal dialysis: Secondary | ICD-10-CM | POA: Diagnosis not present

## 2019-06-14 DIAGNOSIS — N2581 Secondary hyperparathyroidism of renal origin: Secondary | ICD-10-CM | POA: Diagnosis not present

## 2019-06-14 DIAGNOSIS — N186 End stage renal disease: Secondary | ICD-10-CM | POA: Diagnosis not present

## 2019-06-15 DIAGNOSIS — N2581 Secondary hyperparathyroidism of renal origin: Secondary | ICD-10-CM | POA: Diagnosis not present

## 2019-06-15 DIAGNOSIS — D689 Coagulation defect, unspecified: Secondary | ICD-10-CM | POA: Diagnosis not present

## 2019-06-15 DIAGNOSIS — E1122 Type 2 diabetes mellitus with diabetic chronic kidney disease: Secondary | ICD-10-CM | POA: Diagnosis not present

## 2019-06-15 DIAGNOSIS — Z992 Dependence on renal dialysis: Secondary | ICD-10-CM | POA: Diagnosis not present

## 2019-06-15 DIAGNOSIS — D631 Anemia in chronic kidney disease: Secondary | ICD-10-CM | POA: Diagnosis not present

## 2019-06-15 DIAGNOSIS — N186 End stage renal disease: Secondary | ICD-10-CM | POA: Diagnosis not present

## 2019-06-17 DIAGNOSIS — N2581 Secondary hyperparathyroidism of renal origin: Secondary | ICD-10-CM | POA: Diagnosis not present

## 2019-06-17 DIAGNOSIS — E1122 Type 2 diabetes mellitus with diabetic chronic kidney disease: Secondary | ICD-10-CM | POA: Diagnosis not present

## 2019-06-17 DIAGNOSIS — D689 Coagulation defect, unspecified: Secondary | ICD-10-CM | POA: Diagnosis not present

## 2019-06-17 DIAGNOSIS — Z992 Dependence on renal dialysis: Secondary | ICD-10-CM | POA: Diagnosis not present

## 2019-06-17 DIAGNOSIS — D631 Anemia in chronic kidney disease: Secondary | ICD-10-CM | POA: Diagnosis not present

## 2019-06-17 DIAGNOSIS — N186 End stage renal disease: Secondary | ICD-10-CM | POA: Diagnosis not present

## 2019-06-20 DIAGNOSIS — D689 Coagulation defect, unspecified: Secondary | ICD-10-CM | POA: Diagnosis not present

## 2019-06-20 DIAGNOSIS — E1122 Type 2 diabetes mellitus with diabetic chronic kidney disease: Secondary | ICD-10-CM | POA: Diagnosis not present

## 2019-06-20 DIAGNOSIS — N2581 Secondary hyperparathyroidism of renal origin: Secondary | ICD-10-CM | POA: Diagnosis not present

## 2019-06-20 DIAGNOSIS — Z992 Dependence on renal dialysis: Secondary | ICD-10-CM | POA: Diagnosis not present

## 2019-06-20 DIAGNOSIS — D631 Anemia in chronic kidney disease: Secondary | ICD-10-CM | POA: Diagnosis not present

## 2019-06-20 DIAGNOSIS — N186 End stage renal disease: Secondary | ICD-10-CM | POA: Diagnosis not present

## 2019-06-22 DIAGNOSIS — D631 Anemia in chronic kidney disease: Secondary | ICD-10-CM | POA: Diagnosis not present

## 2019-06-22 DIAGNOSIS — E1122 Type 2 diabetes mellitus with diabetic chronic kidney disease: Secondary | ICD-10-CM | POA: Diagnosis not present

## 2019-06-22 DIAGNOSIS — N2581 Secondary hyperparathyroidism of renal origin: Secondary | ICD-10-CM | POA: Diagnosis not present

## 2019-06-22 DIAGNOSIS — Z992 Dependence on renal dialysis: Secondary | ICD-10-CM | POA: Diagnosis not present

## 2019-06-22 DIAGNOSIS — D689 Coagulation defect, unspecified: Secondary | ICD-10-CM | POA: Diagnosis not present

## 2019-06-22 DIAGNOSIS — N186 End stage renal disease: Secondary | ICD-10-CM | POA: Diagnosis not present

## 2019-06-24 DIAGNOSIS — N2581 Secondary hyperparathyroidism of renal origin: Secondary | ICD-10-CM | POA: Diagnosis not present

## 2019-06-24 DIAGNOSIS — N186 End stage renal disease: Secondary | ICD-10-CM | POA: Diagnosis not present

## 2019-06-24 DIAGNOSIS — Z992 Dependence on renal dialysis: Secondary | ICD-10-CM | POA: Diagnosis not present

## 2019-06-24 DIAGNOSIS — D631 Anemia in chronic kidney disease: Secondary | ICD-10-CM | POA: Diagnosis not present

## 2019-06-24 DIAGNOSIS — D689 Coagulation defect, unspecified: Secondary | ICD-10-CM | POA: Diagnosis not present

## 2019-06-24 DIAGNOSIS — E1122 Type 2 diabetes mellitus with diabetic chronic kidney disease: Secondary | ICD-10-CM | POA: Diagnosis not present

## 2019-06-27 DIAGNOSIS — Z992 Dependence on renal dialysis: Secondary | ICD-10-CM | POA: Diagnosis not present

## 2019-06-27 DIAGNOSIS — E1122 Type 2 diabetes mellitus with diabetic chronic kidney disease: Secondary | ICD-10-CM | POA: Diagnosis not present

## 2019-06-27 DIAGNOSIS — N2581 Secondary hyperparathyroidism of renal origin: Secondary | ICD-10-CM | POA: Diagnosis not present

## 2019-06-27 DIAGNOSIS — N186 End stage renal disease: Secondary | ICD-10-CM | POA: Diagnosis not present

## 2019-06-27 DIAGNOSIS — D631 Anemia in chronic kidney disease: Secondary | ICD-10-CM | POA: Diagnosis not present

## 2019-06-27 DIAGNOSIS — D689 Coagulation defect, unspecified: Secondary | ICD-10-CM | POA: Diagnosis not present

## 2019-06-29 DIAGNOSIS — N186 End stage renal disease: Secondary | ICD-10-CM | POA: Diagnosis not present

## 2019-06-29 DIAGNOSIS — E1122 Type 2 diabetes mellitus with diabetic chronic kidney disease: Secondary | ICD-10-CM | POA: Diagnosis not present

## 2019-06-29 DIAGNOSIS — D631 Anemia in chronic kidney disease: Secondary | ICD-10-CM | POA: Diagnosis not present

## 2019-06-29 DIAGNOSIS — Z992 Dependence on renal dialysis: Secondary | ICD-10-CM | POA: Diagnosis not present

## 2019-06-29 DIAGNOSIS — D689 Coagulation defect, unspecified: Secondary | ICD-10-CM | POA: Diagnosis not present

## 2019-06-29 DIAGNOSIS — N2581 Secondary hyperparathyroidism of renal origin: Secondary | ICD-10-CM | POA: Diagnosis not present

## 2019-07-01 DIAGNOSIS — D689 Coagulation defect, unspecified: Secondary | ICD-10-CM | POA: Diagnosis not present

## 2019-07-01 DIAGNOSIS — E1122 Type 2 diabetes mellitus with diabetic chronic kidney disease: Secondary | ICD-10-CM | POA: Diagnosis not present

## 2019-07-01 DIAGNOSIS — N2581 Secondary hyperparathyroidism of renal origin: Secondary | ICD-10-CM | POA: Diagnosis not present

## 2019-07-01 DIAGNOSIS — N186 End stage renal disease: Secondary | ICD-10-CM | POA: Diagnosis not present

## 2019-07-01 DIAGNOSIS — D631 Anemia in chronic kidney disease: Secondary | ICD-10-CM | POA: Diagnosis not present

## 2019-07-01 DIAGNOSIS — Z992 Dependence on renal dialysis: Secondary | ICD-10-CM | POA: Diagnosis not present

## 2019-07-03 DIAGNOSIS — N186 End stage renal disease: Secondary | ICD-10-CM | POA: Diagnosis not present

## 2019-07-03 DIAGNOSIS — I129 Hypertensive chronic kidney disease with stage 1 through stage 4 chronic kidney disease, or unspecified chronic kidney disease: Secondary | ICD-10-CM | POA: Diagnosis not present

## 2019-07-03 DIAGNOSIS — Z992 Dependence on renal dialysis: Secondary | ICD-10-CM | POA: Diagnosis not present

## 2019-07-04 DIAGNOSIS — N2581 Secondary hyperparathyroidism of renal origin: Secondary | ICD-10-CM | POA: Diagnosis not present

## 2019-07-04 DIAGNOSIS — D631 Anemia in chronic kidney disease: Secondary | ICD-10-CM | POA: Diagnosis not present

## 2019-07-04 DIAGNOSIS — D689 Coagulation defect, unspecified: Secondary | ICD-10-CM | POA: Diagnosis not present

## 2019-07-04 DIAGNOSIS — N186 End stage renal disease: Secondary | ICD-10-CM | POA: Diagnosis not present

## 2019-07-04 DIAGNOSIS — Z992 Dependence on renal dialysis: Secondary | ICD-10-CM | POA: Diagnosis not present

## 2019-07-06 DIAGNOSIS — Z992 Dependence on renal dialysis: Secondary | ICD-10-CM | POA: Diagnosis not present

## 2019-07-06 DIAGNOSIS — N186 End stage renal disease: Secondary | ICD-10-CM | POA: Diagnosis not present

## 2019-07-06 DIAGNOSIS — N2581 Secondary hyperparathyroidism of renal origin: Secondary | ICD-10-CM | POA: Diagnosis not present

## 2019-07-06 DIAGNOSIS — D689 Coagulation defect, unspecified: Secondary | ICD-10-CM | POA: Diagnosis not present

## 2019-07-06 DIAGNOSIS — D631 Anemia in chronic kidney disease: Secondary | ICD-10-CM | POA: Diagnosis not present

## 2019-07-07 DIAGNOSIS — D631 Anemia in chronic kidney disease: Secondary | ICD-10-CM | POA: Diagnosis not present

## 2019-07-07 DIAGNOSIS — Z992 Dependence on renal dialysis: Secondary | ICD-10-CM | POA: Diagnosis not present

## 2019-07-07 DIAGNOSIS — N186 End stage renal disease: Secondary | ICD-10-CM | POA: Diagnosis not present

## 2019-07-07 DIAGNOSIS — N2581 Secondary hyperparathyroidism of renal origin: Secondary | ICD-10-CM | POA: Diagnosis not present

## 2019-07-07 DIAGNOSIS — D689 Coagulation defect, unspecified: Secondary | ICD-10-CM | POA: Diagnosis not present

## 2019-07-08 DIAGNOSIS — Z992 Dependence on renal dialysis: Secondary | ICD-10-CM | POA: Diagnosis not present

## 2019-07-08 DIAGNOSIS — N186 End stage renal disease: Secondary | ICD-10-CM | POA: Diagnosis not present

## 2019-07-08 DIAGNOSIS — D689 Coagulation defect, unspecified: Secondary | ICD-10-CM | POA: Diagnosis not present

## 2019-07-08 DIAGNOSIS — D631 Anemia in chronic kidney disease: Secondary | ICD-10-CM | POA: Diagnosis not present

## 2019-07-08 DIAGNOSIS — N2581 Secondary hyperparathyroidism of renal origin: Secondary | ICD-10-CM | POA: Diagnosis not present

## 2019-07-11 DIAGNOSIS — N186 End stage renal disease: Secondary | ICD-10-CM | POA: Diagnosis not present

## 2019-07-11 DIAGNOSIS — N2581 Secondary hyperparathyroidism of renal origin: Secondary | ICD-10-CM | POA: Diagnosis not present

## 2019-07-11 DIAGNOSIS — D689 Coagulation defect, unspecified: Secondary | ICD-10-CM | POA: Diagnosis not present

## 2019-07-11 DIAGNOSIS — Z992 Dependence on renal dialysis: Secondary | ICD-10-CM | POA: Diagnosis not present

## 2019-07-11 DIAGNOSIS — D631 Anemia in chronic kidney disease: Secondary | ICD-10-CM | POA: Diagnosis not present

## 2019-07-13 DIAGNOSIS — N2581 Secondary hyperparathyroidism of renal origin: Secondary | ICD-10-CM | POA: Diagnosis not present

## 2019-07-13 DIAGNOSIS — Z992 Dependence on renal dialysis: Secondary | ICD-10-CM | POA: Diagnosis not present

## 2019-07-13 DIAGNOSIS — D689 Coagulation defect, unspecified: Secondary | ICD-10-CM | POA: Diagnosis not present

## 2019-07-13 DIAGNOSIS — N186 End stage renal disease: Secondary | ICD-10-CM | POA: Diagnosis not present

## 2019-07-13 DIAGNOSIS — D631 Anemia in chronic kidney disease: Secondary | ICD-10-CM | POA: Diagnosis not present

## 2019-07-15 DIAGNOSIS — N2581 Secondary hyperparathyroidism of renal origin: Secondary | ICD-10-CM | POA: Diagnosis not present

## 2019-07-15 DIAGNOSIS — D689 Coagulation defect, unspecified: Secondary | ICD-10-CM | POA: Diagnosis not present

## 2019-07-15 DIAGNOSIS — D631 Anemia in chronic kidney disease: Secondary | ICD-10-CM | POA: Diagnosis not present

## 2019-07-15 DIAGNOSIS — N186 End stage renal disease: Secondary | ICD-10-CM | POA: Diagnosis not present

## 2019-07-15 DIAGNOSIS — Z992 Dependence on renal dialysis: Secondary | ICD-10-CM | POA: Diagnosis not present

## 2019-07-18 DIAGNOSIS — D631 Anemia in chronic kidney disease: Secondary | ICD-10-CM | POA: Diagnosis not present

## 2019-07-18 DIAGNOSIS — D689 Coagulation defect, unspecified: Secondary | ICD-10-CM | POA: Diagnosis not present

## 2019-07-18 DIAGNOSIS — N186 End stage renal disease: Secondary | ICD-10-CM | POA: Diagnosis not present

## 2019-07-18 DIAGNOSIS — Z992 Dependence on renal dialysis: Secondary | ICD-10-CM | POA: Diagnosis not present

## 2019-07-18 DIAGNOSIS — N2581 Secondary hyperparathyroidism of renal origin: Secondary | ICD-10-CM | POA: Diagnosis not present

## 2019-07-20 DIAGNOSIS — D689 Coagulation defect, unspecified: Secondary | ICD-10-CM | POA: Diagnosis not present

## 2019-07-20 DIAGNOSIS — N2581 Secondary hyperparathyroidism of renal origin: Secondary | ICD-10-CM | POA: Diagnosis not present

## 2019-07-20 DIAGNOSIS — D631 Anemia in chronic kidney disease: Secondary | ICD-10-CM | POA: Diagnosis not present

## 2019-07-20 DIAGNOSIS — Z992 Dependence on renal dialysis: Secondary | ICD-10-CM | POA: Diagnosis not present

## 2019-07-20 DIAGNOSIS — N186 End stage renal disease: Secondary | ICD-10-CM | POA: Diagnosis not present

## 2019-07-22 DIAGNOSIS — Z992 Dependence on renal dialysis: Secondary | ICD-10-CM | POA: Diagnosis not present

## 2019-07-22 DIAGNOSIS — N2581 Secondary hyperparathyroidism of renal origin: Secondary | ICD-10-CM | POA: Diagnosis not present

## 2019-07-22 DIAGNOSIS — D631 Anemia in chronic kidney disease: Secondary | ICD-10-CM | POA: Diagnosis not present

## 2019-07-22 DIAGNOSIS — D689 Coagulation defect, unspecified: Secondary | ICD-10-CM | POA: Diagnosis not present

## 2019-07-22 DIAGNOSIS — N186 End stage renal disease: Secondary | ICD-10-CM | POA: Diagnosis not present

## 2019-07-25 DIAGNOSIS — D631 Anemia in chronic kidney disease: Secondary | ICD-10-CM | POA: Diagnosis not present

## 2019-07-25 DIAGNOSIS — N2581 Secondary hyperparathyroidism of renal origin: Secondary | ICD-10-CM | POA: Diagnosis not present

## 2019-07-25 DIAGNOSIS — Z992 Dependence on renal dialysis: Secondary | ICD-10-CM | POA: Diagnosis not present

## 2019-07-25 DIAGNOSIS — N186 End stage renal disease: Secondary | ICD-10-CM | POA: Diagnosis not present

## 2019-07-25 DIAGNOSIS — D689 Coagulation defect, unspecified: Secondary | ICD-10-CM | POA: Diagnosis not present

## 2019-07-27 DIAGNOSIS — N2581 Secondary hyperparathyroidism of renal origin: Secondary | ICD-10-CM | POA: Diagnosis not present

## 2019-07-27 DIAGNOSIS — D631 Anemia in chronic kidney disease: Secondary | ICD-10-CM | POA: Diagnosis not present

## 2019-07-27 DIAGNOSIS — Z992 Dependence on renal dialysis: Secondary | ICD-10-CM | POA: Diagnosis not present

## 2019-07-27 DIAGNOSIS — D689 Coagulation defect, unspecified: Secondary | ICD-10-CM | POA: Diagnosis not present

## 2019-07-27 DIAGNOSIS — N186 End stage renal disease: Secondary | ICD-10-CM | POA: Diagnosis not present

## 2019-07-29 DIAGNOSIS — N186 End stage renal disease: Secondary | ICD-10-CM | POA: Diagnosis not present

## 2019-07-29 DIAGNOSIS — Z992 Dependence on renal dialysis: Secondary | ICD-10-CM | POA: Diagnosis not present

## 2019-07-29 DIAGNOSIS — N2581 Secondary hyperparathyroidism of renal origin: Secondary | ICD-10-CM | POA: Diagnosis not present

## 2019-07-29 DIAGNOSIS — D689 Coagulation defect, unspecified: Secondary | ICD-10-CM | POA: Diagnosis not present

## 2019-07-29 DIAGNOSIS — D631 Anemia in chronic kidney disease: Secondary | ICD-10-CM | POA: Diagnosis not present

## 2019-07-31 DIAGNOSIS — N186 End stage renal disease: Secondary | ICD-10-CM | POA: Diagnosis not present

## 2019-07-31 DIAGNOSIS — I129 Hypertensive chronic kidney disease with stage 1 through stage 4 chronic kidney disease, or unspecified chronic kidney disease: Secondary | ICD-10-CM | POA: Diagnosis not present

## 2019-07-31 DIAGNOSIS — Z992 Dependence on renal dialysis: Secondary | ICD-10-CM | POA: Diagnosis not present

## 2019-08-01 DIAGNOSIS — D689 Coagulation defect, unspecified: Secondary | ICD-10-CM | POA: Diagnosis not present

## 2019-08-01 DIAGNOSIS — N2581 Secondary hyperparathyroidism of renal origin: Secondary | ICD-10-CM | POA: Diagnosis not present

## 2019-08-01 DIAGNOSIS — N186 End stage renal disease: Secondary | ICD-10-CM | POA: Diagnosis not present

## 2019-08-01 DIAGNOSIS — D631 Anemia in chronic kidney disease: Secondary | ICD-10-CM | POA: Diagnosis not present

## 2019-08-01 DIAGNOSIS — Z992 Dependence on renal dialysis: Secondary | ICD-10-CM | POA: Diagnosis not present

## 2019-08-03 DIAGNOSIS — Z992 Dependence on renal dialysis: Secondary | ICD-10-CM | POA: Diagnosis not present

## 2019-08-03 DIAGNOSIS — D631 Anemia in chronic kidney disease: Secondary | ICD-10-CM | POA: Diagnosis not present

## 2019-08-03 DIAGNOSIS — N2581 Secondary hyperparathyroidism of renal origin: Secondary | ICD-10-CM | POA: Diagnosis not present

## 2019-08-03 DIAGNOSIS — N186 End stage renal disease: Secondary | ICD-10-CM | POA: Diagnosis not present

## 2019-08-03 DIAGNOSIS — D689 Coagulation defect, unspecified: Secondary | ICD-10-CM | POA: Diagnosis not present

## 2019-08-05 DIAGNOSIS — N2581 Secondary hyperparathyroidism of renal origin: Secondary | ICD-10-CM | POA: Diagnosis not present

## 2019-08-05 DIAGNOSIS — D631 Anemia in chronic kidney disease: Secondary | ICD-10-CM | POA: Diagnosis not present

## 2019-08-05 DIAGNOSIS — Z992 Dependence on renal dialysis: Secondary | ICD-10-CM | POA: Diagnosis not present

## 2019-08-05 DIAGNOSIS — N186 End stage renal disease: Secondary | ICD-10-CM | POA: Diagnosis not present

## 2019-08-05 DIAGNOSIS — D689 Coagulation defect, unspecified: Secondary | ICD-10-CM | POA: Diagnosis not present

## 2019-08-08 DIAGNOSIS — N186 End stage renal disease: Secondary | ICD-10-CM | POA: Diagnosis not present

## 2019-08-08 DIAGNOSIS — D689 Coagulation defect, unspecified: Secondary | ICD-10-CM | POA: Diagnosis not present

## 2019-08-08 DIAGNOSIS — N2581 Secondary hyperparathyroidism of renal origin: Secondary | ICD-10-CM | POA: Diagnosis not present

## 2019-08-08 DIAGNOSIS — D631 Anemia in chronic kidney disease: Secondary | ICD-10-CM | POA: Diagnosis not present

## 2019-08-08 DIAGNOSIS — Z992 Dependence on renal dialysis: Secondary | ICD-10-CM | POA: Diagnosis not present

## 2019-08-09 DIAGNOSIS — Z992 Dependence on renal dialysis: Secondary | ICD-10-CM | POA: Diagnosis not present

## 2019-08-09 DIAGNOSIS — N186 End stage renal disease: Secondary | ICD-10-CM | POA: Diagnosis not present

## 2019-08-09 DIAGNOSIS — N2581 Secondary hyperparathyroidism of renal origin: Secondary | ICD-10-CM | POA: Diagnosis not present

## 2019-08-09 DIAGNOSIS — D631 Anemia in chronic kidney disease: Secondary | ICD-10-CM | POA: Diagnosis not present

## 2019-08-09 DIAGNOSIS — D689 Coagulation defect, unspecified: Secondary | ICD-10-CM | POA: Diagnosis not present

## 2019-08-10 DIAGNOSIS — N186 End stage renal disease: Secondary | ICD-10-CM | POA: Diagnosis not present

## 2019-08-10 DIAGNOSIS — N2581 Secondary hyperparathyroidism of renal origin: Secondary | ICD-10-CM | POA: Diagnosis not present

## 2019-08-10 DIAGNOSIS — D631 Anemia in chronic kidney disease: Secondary | ICD-10-CM | POA: Diagnosis not present

## 2019-08-10 DIAGNOSIS — D689 Coagulation defect, unspecified: Secondary | ICD-10-CM | POA: Diagnosis not present

## 2019-08-10 DIAGNOSIS — Z992 Dependence on renal dialysis: Secondary | ICD-10-CM | POA: Diagnosis not present

## 2019-08-12 DIAGNOSIS — N2581 Secondary hyperparathyroidism of renal origin: Secondary | ICD-10-CM | POA: Diagnosis not present

## 2019-08-12 DIAGNOSIS — N186 End stage renal disease: Secondary | ICD-10-CM | POA: Diagnosis not present

## 2019-08-12 DIAGNOSIS — D631 Anemia in chronic kidney disease: Secondary | ICD-10-CM | POA: Diagnosis not present

## 2019-08-12 DIAGNOSIS — D689 Coagulation defect, unspecified: Secondary | ICD-10-CM | POA: Diagnosis not present

## 2019-08-12 DIAGNOSIS — Z992 Dependence on renal dialysis: Secondary | ICD-10-CM | POA: Diagnosis not present

## 2019-08-15 DIAGNOSIS — Z992 Dependence on renal dialysis: Secondary | ICD-10-CM | POA: Diagnosis not present

## 2019-08-15 DIAGNOSIS — N186 End stage renal disease: Secondary | ICD-10-CM | POA: Diagnosis not present

## 2019-08-15 DIAGNOSIS — D689 Coagulation defect, unspecified: Secondary | ICD-10-CM | POA: Diagnosis not present

## 2019-08-15 DIAGNOSIS — D631 Anemia in chronic kidney disease: Secondary | ICD-10-CM | POA: Diagnosis not present

## 2019-08-15 DIAGNOSIS — N2581 Secondary hyperparathyroidism of renal origin: Secondary | ICD-10-CM | POA: Diagnosis not present

## 2019-08-17 DIAGNOSIS — Z992 Dependence on renal dialysis: Secondary | ICD-10-CM | POA: Diagnosis not present

## 2019-08-17 DIAGNOSIS — N186 End stage renal disease: Secondary | ICD-10-CM | POA: Diagnosis not present

## 2019-08-17 DIAGNOSIS — D631 Anemia in chronic kidney disease: Secondary | ICD-10-CM | POA: Diagnosis not present

## 2019-08-17 DIAGNOSIS — D689 Coagulation defect, unspecified: Secondary | ICD-10-CM | POA: Diagnosis not present

## 2019-08-17 DIAGNOSIS — N2581 Secondary hyperparathyroidism of renal origin: Secondary | ICD-10-CM | POA: Diagnosis not present

## 2019-08-19 DIAGNOSIS — D689 Coagulation defect, unspecified: Secondary | ICD-10-CM | POA: Diagnosis not present

## 2019-08-19 DIAGNOSIS — N2581 Secondary hyperparathyroidism of renal origin: Secondary | ICD-10-CM | POA: Diagnosis not present

## 2019-08-19 DIAGNOSIS — N186 End stage renal disease: Secondary | ICD-10-CM | POA: Diagnosis not present

## 2019-08-19 DIAGNOSIS — D631 Anemia in chronic kidney disease: Secondary | ICD-10-CM | POA: Diagnosis not present

## 2019-08-19 DIAGNOSIS — Z992 Dependence on renal dialysis: Secondary | ICD-10-CM | POA: Diagnosis not present

## 2019-08-22 DIAGNOSIS — N2581 Secondary hyperparathyroidism of renal origin: Secondary | ICD-10-CM | POA: Diagnosis not present

## 2019-08-22 DIAGNOSIS — D689 Coagulation defect, unspecified: Secondary | ICD-10-CM | POA: Diagnosis not present

## 2019-08-22 DIAGNOSIS — Z992 Dependence on renal dialysis: Secondary | ICD-10-CM | POA: Diagnosis not present

## 2019-08-22 DIAGNOSIS — N186 End stage renal disease: Secondary | ICD-10-CM | POA: Diagnosis not present

## 2019-08-22 DIAGNOSIS — D631 Anemia in chronic kidney disease: Secondary | ICD-10-CM | POA: Diagnosis not present

## 2019-08-24 DIAGNOSIS — D689 Coagulation defect, unspecified: Secondary | ICD-10-CM | POA: Diagnosis not present

## 2019-08-24 DIAGNOSIS — D631 Anemia in chronic kidney disease: Secondary | ICD-10-CM | POA: Diagnosis not present

## 2019-08-24 DIAGNOSIS — N2581 Secondary hyperparathyroidism of renal origin: Secondary | ICD-10-CM | POA: Diagnosis not present

## 2019-08-24 DIAGNOSIS — Z992 Dependence on renal dialysis: Secondary | ICD-10-CM | POA: Diagnosis not present

## 2019-08-24 DIAGNOSIS — N186 End stage renal disease: Secondary | ICD-10-CM | POA: Diagnosis not present

## 2019-08-26 DIAGNOSIS — D689 Coagulation defect, unspecified: Secondary | ICD-10-CM | POA: Diagnosis not present

## 2019-08-26 DIAGNOSIS — N2581 Secondary hyperparathyroidism of renal origin: Secondary | ICD-10-CM | POA: Diagnosis not present

## 2019-08-26 DIAGNOSIS — Z992 Dependence on renal dialysis: Secondary | ICD-10-CM | POA: Diagnosis not present

## 2019-08-26 DIAGNOSIS — D631 Anemia in chronic kidney disease: Secondary | ICD-10-CM | POA: Diagnosis not present

## 2019-08-26 DIAGNOSIS — N186 End stage renal disease: Secondary | ICD-10-CM | POA: Diagnosis not present

## 2019-08-29 DIAGNOSIS — D689 Coagulation defect, unspecified: Secondary | ICD-10-CM | POA: Diagnosis not present

## 2019-08-29 DIAGNOSIS — N2581 Secondary hyperparathyroidism of renal origin: Secondary | ICD-10-CM | POA: Diagnosis not present

## 2019-08-29 DIAGNOSIS — D631 Anemia in chronic kidney disease: Secondary | ICD-10-CM | POA: Diagnosis not present

## 2019-08-29 DIAGNOSIS — Z992 Dependence on renal dialysis: Secondary | ICD-10-CM | POA: Diagnosis not present

## 2019-08-29 DIAGNOSIS — N186 End stage renal disease: Secondary | ICD-10-CM | POA: Diagnosis not present

## 2019-08-31 DIAGNOSIS — I129 Hypertensive chronic kidney disease with stage 1 through stage 4 chronic kidney disease, or unspecified chronic kidney disease: Secondary | ICD-10-CM | POA: Diagnosis not present

## 2019-08-31 DIAGNOSIS — Z992 Dependence on renal dialysis: Secondary | ICD-10-CM | POA: Diagnosis not present

## 2019-08-31 DIAGNOSIS — D689 Coagulation defect, unspecified: Secondary | ICD-10-CM | POA: Diagnosis not present

## 2019-08-31 DIAGNOSIS — N186 End stage renal disease: Secondary | ICD-10-CM | POA: Diagnosis not present

## 2019-08-31 DIAGNOSIS — D631 Anemia in chronic kidney disease: Secondary | ICD-10-CM | POA: Diagnosis not present

## 2019-08-31 DIAGNOSIS — N2581 Secondary hyperparathyroidism of renal origin: Secondary | ICD-10-CM | POA: Diagnosis not present

## 2019-09-02 DIAGNOSIS — N2581 Secondary hyperparathyroidism of renal origin: Secondary | ICD-10-CM | POA: Diagnosis not present

## 2019-09-02 DIAGNOSIS — R52 Pain, unspecified: Secondary | ICD-10-CM | POA: Diagnosis not present

## 2019-09-02 DIAGNOSIS — Z992 Dependence on renal dialysis: Secondary | ICD-10-CM | POA: Diagnosis not present

## 2019-09-02 DIAGNOSIS — D631 Anemia in chronic kidney disease: Secondary | ICD-10-CM | POA: Diagnosis not present

## 2019-09-02 DIAGNOSIS — E1122 Type 2 diabetes mellitus with diabetic chronic kidney disease: Secondary | ICD-10-CM | POA: Diagnosis not present

## 2019-09-02 DIAGNOSIS — N186 End stage renal disease: Secondary | ICD-10-CM | POA: Diagnosis not present

## 2019-09-02 DIAGNOSIS — D689 Coagulation defect, unspecified: Secondary | ICD-10-CM | POA: Diagnosis not present

## 2019-09-05 DIAGNOSIS — Z992 Dependence on renal dialysis: Secondary | ICD-10-CM | POA: Diagnosis not present

## 2019-09-05 DIAGNOSIS — D689 Coagulation defect, unspecified: Secondary | ICD-10-CM | POA: Diagnosis not present

## 2019-09-05 DIAGNOSIS — N186 End stage renal disease: Secondary | ICD-10-CM | POA: Diagnosis not present

## 2019-09-05 DIAGNOSIS — N2581 Secondary hyperparathyroidism of renal origin: Secondary | ICD-10-CM | POA: Diagnosis not present

## 2019-09-05 DIAGNOSIS — E1122 Type 2 diabetes mellitus with diabetic chronic kidney disease: Secondary | ICD-10-CM | POA: Diagnosis not present

## 2019-09-05 DIAGNOSIS — R52 Pain, unspecified: Secondary | ICD-10-CM | POA: Diagnosis not present

## 2019-09-05 DIAGNOSIS — D631 Anemia in chronic kidney disease: Secondary | ICD-10-CM | POA: Diagnosis not present

## 2019-09-07 DIAGNOSIS — R52 Pain, unspecified: Secondary | ICD-10-CM | POA: Diagnosis not present

## 2019-09-07 DIAGNOSIS — Z992 Dependence on renal dialysis: Secondary | ICD-10-CM | POA: Diagnosis not present

## 2019-09-07 DIAGNOSIS — E1122 Type 2 diabetes mellitus with diabetic chronic kidney disease: Secondary | ICD-10-CM | POA: Diagnosis not present

## 2019-09-07 DIAGNOSIS — N186 End stage renal disease: Secondary | ICD-10-CM | POA: Diagnosis not present

## 2019-09-07 DIAGNOSIS — D631 Anemia in chronic kidney disease: Secondary | ICD-10-CM | POA: Diagnosis not present

## 2019-09-07 DIAGNOSIS — N2581 Secondary hyperparathyroidism of renal origin: Secondary | ICD-10-CM | POA: Diagnosis not present

## 2019-09-07 DIAGNOSIS — D689 Coagulation defect, unspecified: Secondary | ICD-10-CM | POA: Diagnosis not present

## 2019-09-08 DIAGNOSIS — E118 Type 2 diabetes mellitus with unspecified complications: Secondary | ICD-10-CM | POA: Diagnosis not present

## 2019-09-08 DIAGNOSIS — E789 Disorder of lipoprotein metabolism, unspecified: Secondary | ICD-10-CM | POA: Diagnosis not present

## 2019-09-08 DIAGNOSIS — E1122 Type 2 diabetes mellitus with diabetic chronic kidney disease: Secondary | ICD-10-CM | POA: Diagnosis not present

## 2019-09-08 DIAGNOSIS — Z0000 Encounter for general adult medical examination without abnormal findings: Secondary | ICD-10-CM | POA: Diagnosis not present

## 2019-09-08 DIAGNOSIS — I10 Essential (primary) hypertension: Secondary | ICD-10-CM | POA: Diagnosis not present

## 2019-09-09 DIAGNOSIS — D631 Anemia in chronic kidney disease: Secondary | ICD-10-CM | POA: Diagnosis not present

## 2019-09-09 DIAGNOSIS — N2581 Secondary hyperparathyroidism of renal origin: Secondary | ICD-10-CM | POA: Diagnosis not present

## 2019-09-09 DIAGNOSIS — Z992 Dependence on renal dialysis: Secondary | ICD-10-CM | POA: Diagnosis not present

## 2019-09-09 DIAGNOSIS — E1122 Type 2 diabetes mellitus with diabetic chronic kidney disease: Secondary | ICD-10-CM | POA: Diagnosis not present

## 2019-09-09 DIAGNOSIS — R52 Pain, unspecified: Secondary | ICD-10-CM | POA: Diagnosis not present

## 2019-09-09 DIAGNOSIS — N186 End stage renal disease: Secondary | ICD-10-CM | POA: Diagnosis not present

## 2019-09-09 DIAGNOSIS — D689 Coagulation defect, unspecified: Secondary | ICD-10-CM | POA: Diagnosis not present

## 2019-09-12 DIAGNOSIS — Z992 Dependence on renal dialysis: Secondary | ICD-10-CM | POA: Diagnosis not present

## 2019-09-12 DIAGNOSIS — N2581 Secondary hyperparathyroidism of renal origin: Secondary | ICD-10-CM | POA: Diagnosis not present

## 2019-09-12 DIAGNOSIS — R52 Pain, unspecified: Secondary | ICD-10-CM | POA: Diagnosis not present

## 2019-09-12 DIAGNOSIS — D631 Anemia in chronic kidney disease: Secondary | ICD-10-CM | POA: Diagnosis not present

## 2019-09-12 DIAGNOSIS — D689 Coagulation defect, unspecified: Secondary | ICD-10-CM | POA: Diagnosis not present

## 2019-09-12 DIAGNOSIS — N186 End stage renal disease: Secondary | ICD-10-CM | POA: Diagnosis not present

## 2019-09-12 DIAGNOSIS — E1122 Type 2 diabetes mellitus with diabetic chronic kidney disease: Secondary | ICD-10-CM | POA: Diagnosis not present

## 2019-09-13 DIAGNOSIS — Z0000 Encounter for general adult medical examination without abnormal findings: Secondary | ICD-10-CM | POA: Diagnosis not present

## 2019-09-13 DIAGNOSIS — I10 Essential (primary) hypertension: Secondary | ICD-10-CM | POA: Diagnosis not present

## 2019-09-13 DIAGNOSIS — N186 End stage renal disease: Secondary | ICD-10-CM | POA: Diagnosis not present

## 2019-09-13 DIAGNOSIS — E1122 Type 2 diabetes mellitus with diabetic chronic kidney disease: Secondary | ICD-10-CM | POA: Diagnosis not present

## 2019-09-14 DIAGNOSIS — R52 Pain, unspecified: Secondary | ICD-10-CM | POA: Diagnosis not present

## 2019-09-14 DIAGNOSIS — N2581 Secondary hyperparathyroidism of renal origin: Secondary | ICD-10-CM | POA: Diagnosis not present

## 2019-09-14 DIAGNOSIS — D631 Anemia in chronic kidney disease: Secondary | ICD-10-CM | POA: Diagnosis not present

## 2019-09-14 DIAGNOSIS — N186 End stage renal disease: Secondary | ICD-10-CM | POA: Diagnosis not present

## 2019-09-14 DIAGNOSIS — D689 Coagulation defect, unspecified: Secondary | ICD-10-CM | POA: Diagnosis not present

## 2019-09-14 DIAGNOSIS — E1122 Type 2 diabetes mellitus with diabetic chronic kidney disease: Secondary | ICD-10-CM | POA: Diagnosis not present

## 2019-09-14 DIAGNOSIS — Z992 Dependence on renal dialysis: Secondary | ICD-10-CM | POA: Diagnosis not present

## 2019-09-16 DIAGNOSIS — Z992 Dependence on renal dialysis: Secondary | ICD-10-CM | POA: Diagnosis not present

## 2019-09-16 DIAGNOSIS — E1122 Type 2 diabetes mellitus with diabetic chronic kidney disease: Secondary | ICD-10-CM | POA: Diagnosis not present

## 2019-09-16 DIAGNOSIS — D631 Anemia in chronic kidney disease: Secondary | ICD-10-CM | POA: Diagnosis not present

## 2019-09-16 DIAGNOSIS — D689 Coagulation defect, unspecified: Secondary | ICD-10-CM | POA: Diagnosis not present

## 2019-09-16 DIAGNOSIS — N186 End stage renal disease: Secondary | ICD-10-CM | POA: Diagnosis not present

## 2019-09-16 DIAGNOSIS — R52 Pain, unspecified: Secondary | ICD-10-CM | POA: Diagnosis not present

## 2019-09-16 DIAGNOSIS — N2581 Secondary hyperparathyroidism of renal origin: Secondary | ICD-10-CM | POA: Diagnosis not present

## 2019-09-19 DIAGNOSIS — E1122 Type 2 diabetes mellitus with diabetic chronic kidney disease: Secondary | ICD-10-CM | POA: Diagnosis not present

## 2019-09-19 DIAGNOSIS — D689 Coagulation defect, unspecified: Secondary | ICD-10-CM | POA: Diagnosis not present

## 2019-09-19 DIAGNOSIS — N2581 Secondary hyperparathyroidism of renal origin: Secondary | ICD-10-CM | POA: Diagnosis not present

## 2019-09-19 DIAGNOSIS — N186 End stage renal disease: Secondary | ICD-10-CM | POA: Diagnosis not present

## 2019-09-19 DIAGNOSIS — R52 Pain, unspecified: Secondary | ICD-10-CM | POA: Diagnosis not present

## 2019-09-19 DIAGNOSIS — Z992 Dependence on renal dialysis: Secondary | ICD-10-CM | POA: Diagnosis not present

## 2019-09-19 DIAGNOSIS — D631 Anemia in chronic kidney disease: Secondary | ICD-10-CM | POA: Diagnosis not present

## 2019-09-21 DIAGNOSIS — D631 Anemia in chronic kidney disease: Secondary | ICD-10-CM | POA: Diagnosis not present

## 2019-09-21 DIAGNOSIS — N186 End stage renal disease: Secondary | ICD-10-CM | POA: Diagnosis not present

## 2019-09-21 DIAGNOSIS — Z992 Dependence on renal dialysis: Secondary | ICD-10-CM | POA: Diagnosis not present

## 2019-09-21 DIAGNOSIS — E1122 Type 2 diabetes mellitus with diabetic chronic kidney disease: Secondary | ICD-10-CM | POA: Diagnosis not present

## 2019-09-21 DIAGNOSIS — N2581 Secondary hyperparathyroidism of renal origin: Secondary | ICD-10-CM | POA: Diagnosis not present

## 2019-09-21 DIAGNOSIS — D689 Coagulation defect, unspecified: Secondary | ICD-10-CM | POA: Diagnosis not present

## 2019-09-21 DIAGNOSIS — R52 Pain, unspecified: Secondary | ICD-10-CM | POA: Diagnosis not present

## 2019-09-23 DIAGNOSIS — D631 Anemia in chronic kidney disease: Secondary | ICD-10-CM | POA: Diagnosis not present

## 2019-09-23 DIAGNOSIS — Z992 Dependence on renal dialysis: Secondary | ICD-10-CM | POA: Diagnosis not present

## 2019-09-23 DIAGNOSIS — N186 End stage renal disease: Secondary | ICD-10-CM | POA: Diagnosis not present

## 2019-09-23 DIAGNOSIS — D689 Coagulation defect, unspecified: Secondary | ICD-10-CM | POA: Diagnosis not present

## 2019-09-23 DIAGNOSIS — R52 Pain, unspecified: Secondary | ICD-10-CM | POA: Diagnosis not present

## 2019-09-23 DIAGNOSIS — N2581 Secondary hyperparathyroidism of renal origin: Secondary | ICD-10-CM | POA: Diagnosis not present

## 2019-09-23 DIAGNOSIS — E1122 Type 2 diabetes mellitus with diabetic chronic kidney disease: Secondary | ICD-10-CM | POA: Diagnosis not present

## 2019-09-26 DIAGNOSIS — D631 Anemia in chronic kidney disease: Secondary | ICD-10-CM | POA: Diagnosis not present

## 2019-09-26 DIAGNOSIS — N186 End stage renal disease: Secondary | ICD-10-CM | POA: Diagnosis not present

## 2019-09-26 DIAGNOSIS — R52 Pain, unspecified: Secondary | ICD-10-CM | POA: Diagnosis not present

## 2019-09-26 DIAGNOSIS — E1122 Type 2 diabetes mellitus with diabetic chronic kidney disease: Secondary | ICD-10-CM | POA: Diagnosis not present

## 2019-09-26 DIAGNOSIS — Z992 Dependence on renal dialysis: Secondary | ICD-10-CM | POA: Diagnosis not present

## 2019-09-26 DIAGNOSIS — N2581 Secondary hyperparathyroidism of renal origin: Secondary | ICD-10-CM | POA: Diagnosis not present

## 2019-09-26 DIAGNOSIS — D689 Coagulation defect, unspecified: Secondary | ICD-10-CM | POA: Diagnosis not present

## 2019-09-28 DIAGNOSIS — D631 Anemia in chronic kidney disease: Secondary | ICD-10-CM | POA: Diagnosis not present

## 2019-09-28 DIAGNOSIS — N186 End stage renal disease: Secondary | ICD-10-CM | POA: Diagnosis not present

## 2019-09-28 DIAGNOSIS — E1122 Type 2 diabetes mellitus with diabetic chronic kidney disease: Secondary | ICD-10-CM | POA: Diagnosis not present

## 2019-09-28 DIAGNOSIS — R52 Pain, unspecified: Secondary | ICD-10-CM | POA: Diagnosis not present

## 2019-09-28 DIAGNOSIS — Z992 Dependence on renal dialysis: Secondary | ICD-10-CM | POA: Diagnosis not present

## 2019-09-28 DIAGNOSIS — D689 Coagulation defect, unspecified: Secondary | ICD-10-CM | POA: Diagnosis not present

## 2019-09-28 DIAGNOSIS — N2581 Secondary hyperparathyroidism of renal origin: Secondary | ICD-10-CM | POA: Diagnosis not present

## 2019-09-30 DIAGNOSIS — E1122 Type 2 diabetes mellitus with diabetic chronic kidney disease: Secondary | ICD-10-CM | POA: Diagnosis not present

## 2019-09-30 DIAGNOSIS — D631 Anemia in chronic kidney disease: Secondary | ICD-10-CM | POA: Diagnosis not present

## 2019-09-30 DIAGNOSIS — N186 End stage renal disease: Secondary | ICD-10-CM | POA: Diagnosis not present

## 2019-09-30 DIAGNOSIS — Z992 Dependence on renal dialysis: Secondary | ICD-10-CM | POA: Diagnosis not present

## 2019-09-30 DIAGNOSIS — D689 Coagulation defect, unspecified: Secondary | ICD-10-CM | POA: Diagnosis not present

## 2019-09-30 DIAGNOSIS — I129 Hypertensive chronic kidney disease with stage 1 through stage 4 chronic kidney disease, or unspecified chronic kidney disease: Secondary | ICD-10-CM | POA: Diagnosis not present

## 2019-09-30 DIAGNOSIS — N2581 Secondary hyperparathyroidism of renal origin: Secondary | ICD-10-CM | POA: Diagnosis not present

## 2019-09-30 DIAGNOSIS — R52 Pain, unspecified: Secondary | ICD-10-CM | POA: Diagnosis not present

## 2019-10-03 DIAGNOSIS — R52 Pain, unspecified: Secondary | ICD-10-CM | POA: Diagnosis not present

## 2019-10-03 DIAGNOSIS — N186 End stage renal disease: Secondary | ICD-10-CM | POA: Diagnosis not present

## 2019-10-03 DIAGNOSIS — Z992 Dependence on renal dialysis: Secondary | ICD-10-CM | POA: Diagnosis not present

## 2019-10-03 DIAGNOSIS — D689 Coagulation defect, unspecified: Secondary | ICD-10-CM | POA: Diagnosis not present

## 2019-10-03 DIAGNOSIS — N2581 Secondary hyperparathyroidism of renal origin: Secondary | ICD-10-CM | POA: Diagnosis not present

## 2019-10-05 DIAGNOSIS — Z992 Dependence on renal dialysis: Secondary | ICD-10-CM | POA: Diagnosis not present

## 2019-10-05 DIAGNOSIS — N2581 Secondary hyperparathyroidism of renal origin: Secondary | ICD-10-CM | POA: Diagnosis not present

## 2019-10-05 DIAGNOSIS — R52 Pain, unspecified: Secondary | ICD-10-CM | POA: Diagnosis not present

## 2019-10-05 DIAGNOSIS — D689 Coagulation defect, unspecified: Secondary | ICD-10-CM | POA: Diagnosis not present

## 2019-10-05 DIAGNOSIS — N186 End stage renal disease: Secondary | ICD-10-CM | POA: Diagnosis not present

## 2019-10-07 DIAGNOSIS — N2581 Secondary hyperparathyroidism of renal origin: Secondary | ICD-10-CM | POA: Diagnosis not present

## 2019-10-07 DIAGNOSIS — R52 Pain, unspecified: Secondary | ICD-10-CM | POA: Diagnosis not present

## 2019-10-07 DIAGNOSIS — D689 Coagulation defect, unspecified: Secondary | ICD-10-CM | POA: Diagnosis not present

## 2019-10-07 DIAGNOSIS — N186 End stage renal disease: Secondary | ICD-10-CM | POA: Diagnosis not present

## 2019-10-07 DIAGNOSIS — Z992 Dependence on renal dialysis: Secondary | ICD-10-CM | POA: Diagnosis not present

## 2019-10-10 DIAGNOSIS — R52 Pain, unspecified: Secondary | ICD-10-CM | POA: Diagnosis not present

## 2019-10-10 DIAGNOSIS — D689 Coagulation defect, unspecified: Secondary | ICD-10-CM | POA: Diagnosis not present

## 2019-10-10 DIAGNOSIS — Z992 Dependence on renal dialysis: Secondary | ICD-10-CM | POA: Diagnosis not present

## 2019-10-10 DIAGNOSIS — N186 End stage renal disease: Secondary | ICD-10-CM | POA: Diagnosis not present

## 2019-10-10 DIAGNOSIS — N2581 Secondary hyperparathyroidism of renal origin: Secondary | ICD-10-CM | POA: Diagnosis not present

## 2019-10-12 DIAGNOSIS — N186 End stage renal disease: Secondary | ICD-10-CM | POA: Diagnosis not present

## 2019-10-12 DIAGNOSIS — R52 Pain, unspecified: Secondary | ICD-10-CM | POA: Diagnosis not present

## 2019-10-12 DIAGNOSIS — D689 Coagulation defect, unspecified: Secondary | ICD-10-CM | POA: Diagnosis not present

## 2019-10-12 DIAGNOSIS — Z992 Dependence on renal dialysis: Secondary | ICD-10-CM | POA: Diagnosis not present

## 2019-10-12 DIAGNOSIS — N2581 Secondary hyperparathyroidism of renal origin: Secondary | ICD-10-CM | POA: Diagnosis not present

## 2019-10-14 DIAGNOSIS — N186 End stage renal disease: Secondary | ICD-10-CM | POA: Diagnosis not present

## 2019-10-14 DIAGNOSIS — Z992 Dependence on renal dialysis: Secondary | ICD-10-CM | POA: Diagnosis not present

## 2019-10-14 DIAGNOSIS — D689 Coagulation defect, unspecified: Secondary | ICD-10-CM | POA: Diagnosis not present

## 2019-10-14 DIAGNOSIS — N2581 Secondary hyperparathyroidism of renal origin: Secondary | ICD-10-CM | POA: Diagnosis not present

## 2019-10-14 DIAGNOSIS — R52 Pain, unspecified: Secondary | ICD-10-CM | POA: Diagnosis not present

## 2019-10-17 DIAGNOSIS — N2581 Secondary hyperparathyroidism of renal origin: Secondary | ICD-10-CM | POA: Diagnosis not present

## 2019-10-17 DIAGNOSIS — D689 Coagulation defect, unspecified: Secondary | ICD-10-CM | POA: Diagnosis not present

## 2019-10-17 DIAGNOSIS — R52 Pain, unspecified: Secondary | ICD-10-CM | POA: Diagnosis not present

## 2019-10-17 DIAGNOSIS — Z992 Dependence on renal dialysis: Secondary | ICD-10-CM | POA: Diagnosis not present

## 2019-10-17 DIAGNOSIS — N186 End stage renal disease: Secondary | ICD-10-CM | POA: Diagnosis not present

## 2019-10-19 DIAGNOSIS — R52 Pain, unspecified: Secondary | ICD-10-CM | POA: Diagnosis not present

## 2019-10-19 DIAGNOSIS — N2581 Secondary hyperparathyroidism of renal origin: Secondary | ICD-10-CM | POA: Diagnosis not present

## 2019-10-19 DIAGNOSIS — D689 Coagulation defect, unspecified: Secondary | ICD-10-CM | POA: Diagnosis not present

## 2019-10-19 DIAGNOSIS — N186 End stage renal disease: Secondary | ICD-10-CM | POA: Diagnosis not present

## 2019-10-19 DIAGNOSIS — Z992 Dependence on renal dialysis: Secondary | ICD-10-CM | POA: Diagnosis not present

## 2019-10-21 DIAGNOSIS — N186 End stage renal disease: Secondary | ICD-10-CM | POA: Diagnosis not present

## 2019-10-21 DIAGNOSIS — Z992 Dependence on renal dialysis: Secondary | ICD-10-CM | POA: Diagnosis not present

## 2019-10-21 DIAGNOSIS — N2581 Secondary hyperparathyroidism of renal origin: Secondary | ICD-10-CM | POA: Diagnosis not present

## 2019-10-21 DIAGNOSIS — D689 Coagulation defect, unspecified: Secondary | ICD-10-CM | POA: Diagnosis not present

## 2019-10-21 DIAGNOSIS — R52 Pain, unspecified: Secondary | ICD-10-CM | POA: Diagnosis not present

## 2019-10-24 DIAGNOSIS — N2581 Secondary hyperparathyroidism of renal origin: Secondary | ICD-10-CM | POA: Diagnosis not present

## 2019-10-24 DIAGNOSIS — Z992 Dependence on renal dialysis: Secondary | ICD-10-CM | POA: Diagnosis not present

## 2019-10-24 DIAGNOSIS — N186 End stage renal disease: Secondary | ICD-10-CM | POA: Diagnosis not present

## 2019-10-24 DIAGNOSIS — R52 Pain, unspecified: Secondary | ICD-10-CM | POA: Diagnosis not present

## 2019-10-24 DIAGNOSIS — D689 Coagulation defect, unspecified: Secondary | ICD-10-CM | POA: Diagnosis not present

## 2019-10-26 DIAGNOSIS — Z992 Dependence on renal dialysis: Secondary | ICD-10-CM | POA: Diagnosis not present

## 2019-10-26 DIAGNOSIS — D689 Coagulation defect, unspecified: Secondary | ICD-10-CM | POA: Diagnosis not present

## 2019-10-26 DIAGNOSIS — R52 Pain, unspecified: Secondary | ICD-10-CM | POA: Diagnosis not present

## 2019-10-26 DIAGNOSIS — N186 End stage renal disease: Secondary | ICD-10-CM | POA: Diagnosis not present

## 2019-10-26 DIAGNOSIS — N2581 Secondary hyperparathyroidism of renal origin: Secondary | ICD-10-CM | POA: Diagnosis not present

## 2019-10-28 DIAGNOSIS — Z992 Dependence on renal dialysis: Secondary | ICD-10-CM | POA: Diagnosis not present

## 2019-10-28 DIAGNOSIS — D689 Coagulation defect, unspecified: Secondary | ICD-10-CM | POA: Diagnosis not present

## 2019-10-28 DIAGNOSIS — N2581 Secondary hyperparathyroidism of renal origin: Secondary | ICD-10-CM | POA: Diagnosis not present

## 2019-10-28 DIAGNOSIS — R52 Pain, unspecified: Secondary | ICD-10-CM | POA: Diagnosis not present

## 2019-10-28 DIAGNOSIS — N186 End stage renal disease: Secondary | ICD-10-CM | POA: Diagnosis not present

## 2019-10-31 DIAGNOSIS — R52 Pain, unspecified: Secondary | ICD-10-CM | POA: Diagnosis not present

## 2019-10-31 DIAGNOSIS — N2581 Secondary hyperparathyroidism of renal origin: Secondary | ICD-10-CM | POA: Diagnosis not present

## 2019-10-31 DIAGNOSIS — N186 End stage renal disease: Secondary | ICD-10-CM | POA: Diagnosis not present

## 2019-10-31 DIAGNOSIS — D689 Coagulation defect, unspecified: Secondary | ICD-10-CM | POA: Diagnosis not present

## 2019-10-31 DIAGNOSIS — I129 Hypertensive chronic kidney disease with stage 1 through stage 4 chronic kidney disease, or unspecified chronic kidney disease: Secondary | ICD-10-CM | POA: Diagnosis not present

## 2019-10-31 DIAGNOSIS — Z992 Dependence on renal dialysis: Secondary | ICD-10-CM | POA: Diagnosis not present

## 2019-11-01 DIAGNOSIS — E7800 Pure hypercholesterolemia, unspecified: Secondary | ICD-10-CM | POA: Diagnosis not present

## 2019-11-01 DIAGNOSIS — Z794 Long term (current) use of insulin: Secondary | ICD-10-CM | POA: Diagnosis not present

## 2019-11-01 DIAGNOSIS — I10 Essential (primary) hypertension: Secondary | ICD-10-CM | POA: Diagnosis not present

## 2019-11-01 DIAGNOSIS — E1122 Type 2 diabetes mellitus with diabetic chronic kidney disease: Secondary | ICD-10-CM | POA: Diagnosis not present

## 2019-11-01 DIAGNOSIS — Z8739 Personal history of other diseases of the musculoskeletal system and connective tissue: Secondary | ICD-10-CM | POA: Diagnosis not present

## 2019-11-02 DIAGNOSIS — D689 Coagulation defect, unspecified: Secondary | ICD-10-CM | POA: Diagnosis not present

## 2019-11-02 DIAGNOSIS — R52 Pain, unspecified: Secondary | ICD-10-CM | POA: Diagnosis not present

## 2019-11-02 DIAGNOSIS — D631 Anemia in chronic kidney disease: Secondary | ICD-10-CM | POA: Diagnosis not present

## 2019-11-02 DIAGNOSIS — N186 End stage renal disease: Secondary | ICD-10-CM | POA: Diagnosis not present

## 2019-11-02 DIAGNOSIS — Z992 Dependence on renal dialysis: Secondary | ICD-10-CM | POA: Diagnosis not present

## 2019-11-02 DIAGNOSIS — N2581 Secondary hyperparathyroidism of renal origin: Secondary | ICD-10-CM | POA: Diagnosis not present

## 2019-11-04 DIAGNOSIS — D689 Coagulation defect, unspecified: Secondary | ICD-10-CM | POA: Diagnosis not present

## 2019-11-04 DIAGNOSIS — Z992 Dependence on renal dialysis: Secondary | ICD-10-CM | POA: Diagnosis not present

## 2019-11-04 DIAGNOSIS — D631 Anemia in chronic kidney disease: Secondary | ICD-10-CM | POA: Diagnosis not present

## 2019-11-04 DIAGNOSIS — N2581 Secondary hyperparathyroidism of renal origin: Secondary | ICD-10-CM | POA: Diagnosis not present

## 2019-11-04 DIAGNOSIS — N186 End stage renal disease: Secondary | ICD-10-CM | POA: Diagnosis not present

## 2019-11-04 DIAGNOSIS — R52 Pain, unspecified: Secondary | ICD-10-CM | POA: Diagnosis not present

## 2019-11-07 DIAGNOSIS — R52 Pain, unspecified: Secondary | ICD-10-CM | POA: Diagnosis not present

## 2019-11-07 DIAGNOSIS — Z992 Dependence on renal dialysis: Secondary | ICD-10-CM | POA: Diagnosis not present

## 2019-11-07 DIAGNOSIS — N2581 Secondary hyperparathyroidism of renal origin: Secondary | ICD-10-CM | POA: Diagnosis not present

## 2019-11-07 DIAGNOSIS — D689 Coagulation defect, unspecified: Secondary | ICD-10-CM | POA: Diagnosis not present

## 2019-11-07 DIAGNOSIS — N186 End stage renal disease: Secondary | ICD-10-CM | POA: Diagnosis not present

## 2019-11-07 DIAGNOSIS — D631 Anemia in chronic kidney disease: Secondary | ICD-10-CM | POA: Diagnosis not present

## 2019-11-08 DIAGNOSIS — G4733 Obstructive sleep apnea (adult) (pediatric): Secondary | ICD-10-CM | POA: Diagnosis not present

## 2019-11-09 DIAGNOSIS — D689 Coagulation defect, unspecified: Secondary | ICD-10-CM | POA: Diagnosis not present

## 2019-11-09 DIAGNOSIS — N2581 Secondary hyperparathyroidism of renal origin: Secondary | ICD-10-CM | POA: Diagnosis not present

## 2019-11-09 DIAGNOSIS — R52 Pain, unspecified: Secondary | ICD-10-CM | POA: Diagnosis not present

## 2019-11-09 DIAGNOSIS — D631 Anemia in chronic kidney disease: Secondary | ICD-10-CM | POA: Diagnosis not present

## 2019-11-09 DIAGNOSIS — Z992 Dependence on renal dialysis: Secondary | ICD-10-CM | POA: Diagnosis not present

## 2019-11-09 DIAGNOSIS — N186 End stage renal disease: Secondary | ICD-10-CM | POA: Diagnosis not present

## 2019-11-11 DIAGNOSIS — D689 Coagulation defect, unspecified: Secondary | ICD-10-CM | POA: Diagnosis not present

## 2019-11-11 DIAGNOSIS — D631 Anemia in chronic kidney disease: Secondary | ICD-10-CM | POA: Diagnosis not present

## 2019-11-11 DIAGNOSIS — Z992 Dependence on renal dialysis: Secondary | ICD-10-CM | POA: Diagnosis not present

## 2019-11-11 DIAGNOSIS — R52 Pain, unspecified: Secondary | ICD-10-CM | POA: Diagnosis not present

## 2019-11-11 DIAGNOSIS — N2581 Secondary hyperparathyroidism of renal origin: Secondary | ICD-10-CM | POA: Diagnosis not present

## 2019-11-11 DIAGNOSIS — N186 End stage renal disease: Secondary | ICD-10-CM | POA: Diagnosis not present

## 2019-11-14 DIAGNOSIS — R52 Pain, unspecified: Secondary | ICD-10-CM | POA: Diagnosis not present

## 2019-11-14 DIAGNOSIS — Z992 Dependence on renal dialysis: Secondary | ICD-10-CM | POA: Diagnosis not present

## 2019-11-14 DIAGNOSIS — D631 Anemia in chronic kidney disease: Secondary | ICD-10-CM | POA: Diagnosis not present

## 2019-11-14 DIAGNOSIS — N186 End stage renal disease: Secondary | ICD-10-CM | POA: Diagnosis not present

## 2019-11-14 DIAGNOSIS — D689 Coagulation defect, unspecified: Secondary | ICD-10-CM | POA: Diagnosis not present

## 2019-11-14 DIAGNOSIS — N2581 Secondary hyperparathyroidism of renal origin: Secondary | ICD-10-CM | POA: Diagnosis not present

## 2019-11-16 DIAGNOSIS — R52 Pain, unspecified: Secondary | ICD-10-CM | POA: Diagnosis not present

## 2019-11-16 DIAGNOSIS — Z992 Dependence on renal dialysis: Secondary | ICD-10-CM | POA: Diagnosis not present

## 2019-11-16 DIAGNOSIS — D689 Coagulation defect, unspecified: Secondary | ICD-10-CM | POA: Diagnosis not present

## 2019-11-16 DIAGNOSIS — N2581 Secondary hyperparathyroidism of renal origin: Secondary | ICD-10-CM | POA: Diagnosis not present

## 2019-11-16 DIAGNOSIS — D631 Anemia in chronic kidney disease: Secondary | ICD-10-CM | POA: Diagnosis not present

## 2019-11-16 DIAGNOSIS — N186 End stage renal disease: Secondary | ICD-10-CM | POA: Diagnosis not present

## 2019-11-18 DIAGNOSIS — Z992 Dependence on renal dialysis: Secondary | ICD-10-CM | POA: Diagnosis not present

## 2019-11-18 DIAGNOSIS — N2581 Secondary hyperparathyroidism of renal origin: Secondary | ICD-10-CM | POA: Diagnosis not present

## 2019-11-18 DIAGNOSIS — D631 Anemia in chronic kidney disease: Secondary | ICD-10-CM | POA: Diagnosis not present

## 2019-11-18 DIAGNOSIS — D689 Coagulation defect, unspecified: Secondary | ICD-10-CM | POA: Diagnosis not present

## 2019-11-18 DIAGNOSIS — N186 End stage renal disease: Secondary | ICD-10-CM | POA: Diagnosis not present

## 2019-11-18 DIAGNOSIS — R52 Pain, unspecified: Secondary | ICD-10-CM | POA: Diagnosis not present

## 2019-11-21 DIAGNOSIS — N186 End stage renal disease: Secondary | ICD-10-CM | POA: Diagnosis not present

## 2019-11-21 DIAGNOSIS — D631 Anemia in chronic kidney disease: Secondary | ICD-10-CM | POA: Diagnosis not present

## 2019-11-21 DIAGNOSIS — Z992 Dependence on renal dialysis: Secondary | ICD-10-CM | POA: Diagnosis not present

## 2019-11-21 DIAGNOSIS — D689 Coagulation defect, unspecified: Secondary | ICD-10-CM | POA: Diagnosis not present

## 2019-11-21 DIAGNOSIS — R52 Pain, unspecified: Secondary | ICD-10-CM | POA: Diagnosis not present

## 2019-11-21 DIAGNOSIS — N2581 Secondary hyperparathyroidism of renal origin: Secondary | ICD-10-CM | POA: Diagnosis not present

## 2019-11-23 DIAGNOSIS — D631 Anemia in chronic kidney disease: Secondary | ICD-10-CM | POA: Diagnosis not present

## 2019-11-23 DIAGNOSIS — D689 Coagulation defect, unspecified: Secondary | ICD-10-CM | POA: Diagnosis not present

## 2019-11-23 DIAGNOSIS — Z992 Dependence on renal dialysis: Secondary | ICD-10-CM | POA: Diagnosis not present

## 2019-11-23 DIAGNOSIS — N186 End stage renal disease: Secondary | ICD-10-CM | POA: Diagnosis not present

## 2019-11-23 DIAGNOSIS — N2581 Secondary hyperparathyroidism of renal origin: Secondary | ICD-10-CM | POA: Diagnosis not present

## 2019-11-23 DIAGNOSIS — R52 Pain, unspecified: Secondary | ICD-10-CM | POA: Diagnosis not present

## 2019-11-25 DIAGNOSIS — D689 Coagulation defect, unspecified: Secondary | ICD-10-CM | POA: Diagnosis not present

## 2019-11-25 DIAGNOSIS — N2581 Secondary hyperparathyroidism of renal origin: Secondary | ICD-10-CM | POA: Diagnosis not present

## 2019-11-25 DIAGNOSIS — R52 Pain, unspecified: Secondary | ICD-10-CM | POA: Diagnosis not present

## 2019-11-25 DIAGNOSIS — D631 Anemia in chronic kidney disease: Secondary | ICD-10-CM | POA: Diagnosis not present

## 2019-11-25 DIAGNOSIS — N186 End stage renal disease: Secondary | ICD-10-CM | POA: Diagnosis not present

## 2019-11-25 DIAGNOSIS — Z992 Dependence on renal dialysis: Secondary | ICD-10-CM | POA: Diagnosis not present

## 2019-11-28 DIAGNOSIS — N2581 Secondary hyperparathyroidism of renal origin: Secondary | ICD-10-CM | POA: Diagnosis not present

## 2019-11-28 DIAGNOSIS — R52 Pain, unspecified: Secondary | ICD-10-CM | POA: Diagnosis not present

## 2019-11-28 DIAGNOSIS — N186 End stage renal disease: Secondary | ICD-10-CM | POA: Diagnosis not present

## 2019-11-28 DIAGNOSIS — D689 Coagulation defect, unspecified: Secondary | ICD-10-CM | POA: Diagnosis not present

## 2019-11-28 DIAGNOSIS — Z992 Dependence on renal dialysis: Secondary | ICD-10-CM | POA: Diagnosis not present

## 2019-11-28 DIAGNOSIS — D631 Anemia in chronic kidney disease: Secondary | ICD-10-CM | POA: Diagnosis not present

## 2019-11-29 DIAGNOSIS — N186 End stage renal disease: Secondary | ICD-10-CM | POA: Diagnosis not present

## 2019-11-29 DIAGNOSIS — Z992 Dependence on renal dialysis: Secondary | ICD-10-CM | POA: Diagnosis not present

## 2019-11-29 DIAGNOSIS — I871 Compression of vein: Secondary | ICD-10-CM | POA: Diagnosis not present

## 2019-11-30 DIAGNOSIS — Z992 Dependence on renal dialysis: Secondary | ICD-10-CM | POA: Diagnosis not present

## 2019-11-30 DIAGNOSIS — N2581 Secondary hyperparathyroidism of renal origin: Secondary | ICD-10-CM | POA: Diagnosis not present

## 2019-11-30 DIAGNOSIS — D631 Anemia in chronic kidney disease: Secondary | ICD-10-CM | POA: Diagnosis not present

## 2019-11-30 DIAGNOSIS — R52 Pain, unspecified: Secondary | ICD-10-CM | POA: Diagnosis not present

## 2019-11-30 DIAGNOSIS — D689 Coagulation defect, unspecified: Secondary | ICD-10-CM | POA: Diagnosis not present

## 2019-11-30 DIAGNOSIS — I129 Hypertensive chronic kidney disease with stage 1 through stage 4 chronic kidney disease, or unspecified chronic kidney disease: Secondary | ICD-10-CM | POA: Diagnosis not present

## 2019-11-30 DIAGNOSIS — N186 End stage renal disease: Secondary | ICD-10-CM | POA: Diagnosis not present

## 2019-12-02 DIAGNOSIS — D631 Anemia in chronic kidney disease: Secondary | ICD-10-CM | POA: Diagnosis not present

## 2019-12-02 DIAGNOSIS — N186 End stage renal disease: Secondary | ICD-10-CM | POA: Diagnosis not present

## 2019-12-02 DIAGNOSIS — N2581 Secondary hyperparathyroidism of renal origin: Secondary | ICD-10-CM | POA: Diagnosis not present

## 2019-12-02 DIAGNOSIS — D689 Coagulation defect, unspecified: Secondary | ICD-10-CM | POA: Diagnosis not present

## 2019-12-02 DIAGNOSIS — R52 Pain, unspecified: Secondary | ICD-10-CM | POA: Diagnosis not present

## 2019-12-02 DIAGNOSIS — Z992 Dependence on renal dialysis: Secondary | ICD-10-CM | POA: Diagnosis not present

## 2019-12-02 DIAGNOSIS — E1122 Type 2 diabetes mellitus with diabetic chronic kidney disease: Secondary | ICD-10-CM | POA: Diagnosis not present

## 2019-12-05 DIAGNOSIS — R52 Pain, unspecified: Secondary | ICD-10-CM | POA: Diagnosis not present

## 2019-12-05 DIAGNOSIS — N2581 Secondary hyperparathyroidism of renal origin: Secondary | ICD-10-CM | POA: Diagnosis not present

## 2019-12-05 DIAGNOSIS — D631 Anemia in chronic kidney disease: Secondary | ICD-10-CM | POA: Diagnosis not present

## 2019-12-05 DIAGNOSIS — N186 End stage renal disease: Secondary | ICD-10-CM | POA: Diagnosis not present

## 2019-12-05 DIAGNOSIS — E1122 Type 2 diabetes mellitus with diabetic chronic kidney disease: Secondary | ICD-10-CM | POA: Diagnosis not present

## 2019-12-05 DIAGNOSIS — Z992 Dependence on renal dialysis: Secondary | ICD-10-CM | POA: Diagnosis not present

## 2019-12-05 DIAGNOSIS — D689 Coagulation defect, unspecified: Secondary | ICD-10-CM | POA: Diagnosis not present

## 2019-12-07 DIAGNOSIS — D631 Anemia in chronic kidney disease: Secondary | ICD-10-CM | POA: Diagnosis not present

## 2019-12-07 DIAGNOSIS — R52 Pain, unspecified: Secondary | ICD-10-CM | POA: Diagnosis not present

## 2019-12-07 DIAGNOSIS — D689 Coagulation defect, unspecified: Secondary | ICD-10-CM | POA: Diagnosis not present

## 2019-12-07 DIAGNOSIS — Z992 Dependence on renal dialysis: Secondary | ICD-10-CM | POA: Diagnosis not present

## 2019-12-07 DIAGNOSIS — E1122 Type 2 diabetes mellitus with diabetic chronic kidney disease: Secondary | ICD-10-CM | POA: Diagnosis not present

## 2019-12-07 DIAGNOSIS — N186 End stage renal disease: Secondary | ICD-10-CM | POA: Diagnosis not present

## 2019-12-07 DIAGNOSIS — N2581 Secondary hyperparathyroidism of renal origin: Secondary | ICD-10-CM | POA: Diagnosis not present

## 2019-12-09 DIAGNOSIS — N186 End stage renal disease: Secondary | ICD-10-CM | POA: Diagnosis not present

## 2019-12-09 DIAGNOSIS — N2581 Secondary hyperparathyroidism of renal origin: Secondary | ICD-10-CM | POA: Diagnosis not present

## 2019-12-09 DIAGNOSIS — Z992 Dependence on renal dialysis: Secondary | ICD-10-CM | POA: Diagnosis not present

## 2019-12-09 DIAGNOSIS — R52 Pain, unspecified: Secondary | ICD-10-CM | POA: Diagnosis not present

## 2019-12-09 DIAGNOSIS — D689 Coagulation defect, unspecified: Secondary | ICD-10-CM | POA: Diagnosis not present

## 2019-12-09 DIAGNOSIS — E1122 Type 2 diabetes mellitus with diabetic chronic kidney disease: Secondary | ICD-10-CM | POA: Diagnosis not present

## 2019-12-09 DIAGNOSIS — D631 Anemia in chronic kidney disease: Secondary | ICD-10-CM | POA: Diagnosis not present

## 2019-12-12 DIAGNOSIS — E1122 Type 2 diabetes mellitus with diabetic chronic kidney disease: Secondary | ICD-10-CM | POA: Diagnosis not present

## 2019-12-12 DIAGNOSIS — D631 Anemia in chronic kidney disease: Secondary | ICD-10-CM | POA: Diagnosis not present

## 2019-12-12 DIAGNOSIS — D689 Coagulation defect, unspecified: Secondary | ICD-10-CM | POA: Diagnosis not present

## 2019-12-12 DIAGNOSIS — Z992 Dependence on renal dialysis: Secondary | ICD-10-CM | POA: Diagnosis not present

## 2019-12-12 DIAGNOSIS — N2581 Secondary hyperparathyroidism of renal origin: Secondary | ICD-10-CM | POA: Diagnosis not present

## 2019-12-12 DIAGNOSIS — R52 Pain, unspecified: Secondary | ICD-10-CM | POA: Diagnosis not present

## 2019-12-12 DIAGNOSIS — N186 End stage renal disease: Secondary | ICD-10-CM | POA: Diagnosis not present

## 2019-12-14 DIAGNOSIS — D689 Coagulation defect, unspecified: Secondary | ICD-10-CM | POA: Diagnosis not present

## 2019-12-14 DIAGNOSIS — R52 Pain, unspecified: Secondary | ICD-10-CM | POA: Diagnosis not present

## 2019-12-14 DIAGNOSIS — E1122 Type 2 diabetes mellitus with diabetic chronic kidney disease: Secondary | ICD-10-CM | POA: Diagnosis not present

## 2019-12-14 DIAGNOSIS — Z992 Dependence on renal dialysis: Secondary | ICD-10-CM | POA: Diagnosis not present

## 2019-12-14 DIAGNOSIS — N2581 Secondary hyperparathyroidism of renal origin: Secondary | ICD-10-CM | POA: Diagnosis not present

## 2019-12-14 DIAGNOSIS — N186 End stage renal disease: Secondary | ICD-10-CM | POA: Diagnosis not present

## 2019-12-14 DIAGNOSIS — D631 Anemia in chronic kidney disease: Secondary | ICD-10-CM | POA: Diagnosis not present

## 2019-12-16 DIAGNOSIS — E1122 Type 2 diabetes mellitus with diabetic chronic kidney disease: Secondary | ICD-10-CM | POA: Diagnosis not present

## 2019-12-16 DIAGNOSIS — N186 End stage renal disease: Secondary | ICD-10-CM | POA: Diagnosis not present

## 2019-12-16 DIAGNOSIS — D631 Anemia in chronic kidney disease: Secondary | ICD-10-CM | POA: Diagnosis not present

## 2019-12-16 DIAGNOSIS — R52 Pain, unspecified: Secondary | ICD-10-CM | POA: Diagnosis not present

## 2019-12-16 DIAGNOSIS — D689 Coagulation defect, unspecified: Secondary | ICD-10-CM | POA: Diagnosis not present

## 2019-12-16 DIAGNOSIS — Z992 Dependence on renal dialysis: Secondary | ICD-10-CM | POA: Diagnosis not present

## 2019-12-16 DIAGNOSIS — N2581 Secondary hyperparathyroidism of renal origin: Secondary | ICD-10-CM | POA: Diagnosis not present

## 2019-12-19 DIAGNOSIS — D631 Anemia in chronic kidney disease: Secondary | ICD-10-CM | POA: Diagnosis not present

## 2019-12-19 DIAGNOSIS — D689 Coagulation defect, unspecified: Secondary | ICD-10-CM | POA: Diagnosis not present

## 2019-12-19 DIAGNOSIS — N186 End stage renal disease: Secondary | ICD-10-CM | POA: Diagnosis not present

## 2019-12-19 DIAGNOSIS — Z992 Dependence on renal dialysis: Secondary | ICD-10-CM | POA: Diagnosis not present

## 2019-12-19 DIAGNOSIS — N2581 Secondary hyperparathyroidism of renal origin: Secondary | ICD-10-CM | POA: Diagnosis not present

## 2019-12-19 DIAGNOSIS — E1122 Type 2 diabetes mellitus with diabetic chronic kidney disease: Secondary | ICD-10-CM | POA: Diagnosis not present

## 2019-12-19 DIAGNOSIS — R52 Pain, unspecified: Secondary | ICD-10-CM | POA: Diagnosis not present

## 2019-12-21 DIAGNOSIS — N2581 Secondary hyperparathyroidism of renal origin: Secondary | ICD-10-CM | POA: Diagnosis not present

## 2019-12-21 DIAGNOSIS — N186 End stage renal disease: Secondary | ICD-10-CM | POA: Diagnosis not present

## 2019-12-21 DIAGNOSIS — E1122 Type 2 diabetes mellitus with diabetic chronic kidney disease: Secondary | ICD-10-CM | POA: Diagnosis not present

## 2019-12-21 DIAGNOSIS — R52 Pain, unspecified: Secondary | ICD-10-CM | POA: Diagnosis not present

## 2019-12-21 DIAGNOSIS — D689 Coagulation defect, unspecified: Secondary | ICD-10-CM | POA: Diagnosis not present

## 2019-12-21 DIAGNOSIS — Z992 Dependence on renal dialysis: Secondary | ICD-10-CM | POA: Diagnosis not present

## 2019-12-21 DIAGNOSIS — D631 Anemia in chronic kidney disease: Secondary | ICD-10-CM | POA: Diagnosis not present

## 2019-12-23 DIAGNOSIS — D631 Anemia in chronic kidney disease: Secondary | ICD-10-CM | POA: Diagnosis not present

## 2019-12-23 DIAGNOSIS — R52 Pain, unspecified: Secondary | ICD-10-CM | POA: Diagnosis not present

## 2019-12-23 DIAGNOSIS — E1122 Type 2 diabetes mellitus with diabetic chronic kidney disease: Secondary | ICD-10-CM | POA: Diagnosis not present

## 2019-12-23 DIAGNOSIS — N2581 Secondary hyperparathyroidism of renal origin: Secondary | ICD-10-CM | POA: Diagnosis not present

## 2019-12-23 DIAGNOSIS — Z992 Dependence on renal dialysis: Secondary | ICD-10-CM | POA: Diagnosis not present

## 2019-12-23 DIAGNOSIS — D689 Coagulation defect, unspecified: Secondary | ICD-10-CM | POA: Diagnosis not present

## 2019-12-23 DIAGNOSIS — N186 End stage renal disease: Secondary | ICD-10-CM | POA: Diagnosis not present

## 2019-12-26 DIAGNOSIS — Z992 Dependence on renal dialysis: Secondary | ICD-10-CM | POA: Diagnosis not present

## 2019-12-26 DIAGNOSIS — D631 Anemia in chronic kidney disease: Secondary | ICD-10-CM | POA: Diagnosis not present

## 2019-12-26 DIAGNOSIS — R52 Pain, unspecified: Secondary | ICD-10-CM | POA: Diagnosis not present

## 2019-12-26 DIAGNOSIS — E1122 Type 2 diabetes mellitus with diabetic chronic kidney disease: Secondary | ICD-10-CM | POA: Diagnosis not present

## 2019-12-26 DIAGNOSIS — N2581 Secondary hyperparathyroidism of renal origin: Secondary | ICD-10-CM | POA: Diagnosis not present

## 2019-12-26 DIAGNOSIS — N186 End stage renal disease: Secondary | ICD-10-CM | POA: Diagnosis not present

## 2019-12-26 DIAGNOSIS — D689 Coagulation defect, unspecified: Secondary | ICD-10-CM | POA: Diagnosis not present

## 2019-12-28 DIAGNOSIS — E1122 Type 2 diabetes mellitus with diabetic chronic kidney disease: Secondary | ICD-10-CM | POA: Diagnosis not present

## 2019-12-28 DIAGNOSIS — R52 Pain, unspecified: Secondary | ICD-10-CM | POA: Diagnosis not present

## 2019-12-28 DIAGNOSIS — N2581 Secondary hyperparathyroidism of renal origin: Secondary | ICD-10-CM | POA: Diagnosis not present

## 2019-12-28 DIAGNOSIS — N186 End stage renal disease: Secondary | ICD-10-CM | POA: Diagnosis not present

## 2019-12-28 DIAGNOSIS — Z992 Dependence on renal dialysis: Secondary | ICD-10-CM | POA: Diagnosis not present

## 2019-12-28 DIAGNOSIS — D689 Coagulation defect, unspecified: Secondary | ICD-10-CM | POA: Diagnosis not present

## 2019-12-28 DIAGNOSIS — D631 Anemia in chronic kidney disease: Secondary | ICD-10-CM | POA: Diagnosis not present

## 2019-12-30 DIAGNOSIS — N2581 Secondary hyperparathyroidism of renal origin: Secondary | ICD-10-CM | POA: Diagnosis not present

## 2019-12-30 DIAGNOSIS — R52 Pain, unspecified: Secondary | ICD-10-CM | POA: Diagnosis not present

## 2019-12-30 DIAGNOSIS — Z992 Dependence on renal dialysis: Secondary | ICD-10-CM | POA: Diagnosis not present

## 2019-12-30 DIAGNOSIS — D631 Anemia in chronic kidney disease: Secondary | ICD-10-CM | POA: Diagnosis not present

## 2019-12-30 DIAGNOSIS — E1122 Type 2 diabetes mellitus with diabetic chronic kidney disease: Secondary | ICD-10-CM | POA: Diagnosis not present

## 2019-12-30 DIAGNOSIS — N186 End stage renal disease: Secondary | ICD-10-CM | POA: Diagnosis not present

## 2019-12-30 DIAGNOSIS — D689 Coagulation defect, unspecified: Secondary | ICD-10-CM | POA: Diagnosis not present

## 2019-12-31 DIAGNOSIS — N186 End stage renal disease: Secondary | ICD-10-CM | POA: Diagnosis not present

## 2019-12-31 DIAGNOSIS — I129 Hypertensive chronic kidney disease with stage 1 through stage 4 chronic kidney disease, or unspecified chronic kidney disease: Secondary | ICD-10-CM | POA: Diagnosis not present

## 2019-12-31 DIAGNOSIS — Z992 Dependence on renal dialysis: Secondary | ICD-10-CM | POA: Diagnosis not present

## 2020-01-02 DIAGNOSIS — N2581 Secondary hyperparathyroidism of renal origin: Secondary | ICD-10-CM | POA: Diagnosis not present

## 2020-01-02 DIAGNOSIS — R52 Pain, unspecified: Secondary | ICD-10-CM | POA: Diagnosis not present

## 2020-01-02 DIAGNOSIS — D631 Anemia in chronic kidney disease: Secondary | ICD-10-CM | POA: Diagnosis not present

## 2020-01-02 DIAGNOSIS — N186 End stage renal disease: Secondary | ICD-10-CM | POA: Diagnosis not present

## 2020-01-02 DIAGNOSIS — Z992 Dependence on renal dialysis: Secondary | ICD-10-CM | POA: Diagnosis not present

## 2020-01-02 DIAGNOSIS — D689 Coagulation defect, unspecified: Secondary | ICD-10-CM | POA: Diagnosis not present

## 2020-01-03 DIAGNOSIS — R52 Pain, unspecified: Secondary | ICD-10-CM | POA: Diagnosis not present

## 2020-01-03 DIAGNOSIS — D631 Anemia in chronic kidney disease: Secondary | ICD-10-CM | POA: Diagnosis not present

## 2020-01-03 DIAGNOSIS — D689 Coagulation defect, unspecified: Secondary | ICD-10-CM | POA: Diagnosis not present

## 2020-01-03 DIAGNOSIS — N2581 Secondary hyperparathyroidism of renal origin: Secondary | ICD-10-CM | POA: Diagnosis not present

## 2020-01-03 DIAGNOSIS — N186 End stage renal disease: Secondary | ICD-10-CM | POA: Diagnosis not present

## 2020-01-03 DIAGNOSIS — Z992 Dependence on renal dialysis: Secondary | ICD-10-CM | POA: Diagnosis not present

## 2020-01-04 DIAGNOSIS — R52 Pain, unspecified: Secondary | ICD-10-CM | POA: Diagnosis not present

## 2020-01-04 DIAGNOSIS — N186 End stage renal disease: Secondary | ICD-10-CM | POA: Diagnosis not present

## 2020-01-04 DIAGNOSIS — N2581 Secondary hyperparathyroidism of renal origin: Secondary | ICD-10-CM | POA: Diagnosis not present

## 2020-01-04 DIAGNOSIS — D689 Coagulation defect, unspecified: Secondary | ICD-10-CM | POA: Diagnosis not present

## 2020-01-04 DIAGNOSIS — Z992 Dependence on renal dialysis: Secondary | ICD-10-CM | POA: Diagnosis not present

## 2020-01-04 DIAGNOSIS — D631 Anemia in chronic kidney disease: Secondary | ICD-10-CM | POA: Diagnosis not present

## 2020-01-06 DIAGNOSIS — D689 Coagulation defect, unspecified: Secondary | ICD-10-CM | POA: Diagnosis not present

## 2020-01-06 DIAGNOSIS — D631 Anemia in chronic kidney disease: Secondary | ICD-10-CM | POA: Diagnosis not present

## 2020-01-06 DIAGNOSIS — Z992 Dependence on renal dialysis: Secondary | ICD-10-CM | POA: Diagnosis not present

## 2020-01-06 DIAGNOSIS — N2581 Secondary hyperparathyroidism of renal origin: Secondary | ICD-10-CM | POA: Diagnosis not present

## 2020-01-06 DIAGNOSIS — R52 Pain, unspecified: Secondary | ICD-10-CM | POA: Diagnosis not present

## 2020-01-06 DIAGNOSIS — N186 End stage renal disease: Secondary | ICD-10-CM | POA: Diagnosis not present

## 2020-01-09 DIAGNOSIS — Z992 Dependence on renal dialysis: Secondary | ICD-10-CM | POA: Diagnosis not present

## 2020-01-09 DIAGNOSIS — R52 Pain, unspecified: Secondary | ICD-10-CM | POA: Diagnosis not present

## 2020-01-09 DIAGNOSIS — D631 Anemia in chronic kidney disease: Secondary | ICD-10-CM | POA: Diagnosis not present

## 2020-01-09 DIAGNOSIS — N186 End stage renal disease: Secondary | ICD-10-CM | POA: Diagnosis not present

## 2020-01-09 DIAGNOSIS — N2581 Secondary hyperparathyroidism of renal origin: Secondary | ICD-10-CM | POA: Diagnosis not present

## 2020-01-09 DIAGNOSIS — D689 Coagulation defect, unspecified: Secondary | ICD-10-CM | POA: Diagnosis not present

## 2020-01-11 DIAGNOSIS — R52 Pain, unspecified: Secondary | ICD-10-CM | POA: Diagnosis not present

## 2020-01-11 DIAGNOSIS — N2581 Secondary hyperparathyroidism of renal origin: Secondary | ICD-10-CM | POA: Diagnosis not present

## 2020-01-11 DIAGNOSIS — Z992 Dependence on renal dialysis: Secondary | ICD-10-CM | POA: Diagnosis not present

## 2020-01-11 DIAGNOSIS — D631 Anemia in chronic kidney disease: Secondary | ICD-10-CM | POA: Diagnosis not present

## 2020-01-11 DIAGNOSIS — N186 End stage renal disease: Secondary | ICD-10-CM | POA: Diagnosis not present

## 2020-01-11 DIAGNOSIS — D689 Coagulation defect, unspecified: Secondary | ICD-10-CM | POA: Diagnosis not present

## 2020-01-13 DIAGNOSIS — D689 Coagulation defect, unspecified: Secondary | ICD-10-CM | POA: Diagnosis not present

## 2020-01-13 DIAGNOSIS — N186 End stage renal disease: Secondary | ICD-10-CM | POA: Diagnosis not present

## 2020-01-13 DIAGNOSIS — R52 Pain, unspecified: Secondary | ICD-10-CM | POA: Diagnosis not present

## 2020-01-13 DIAGNOSIS — Z992 Dependence on renal dialysis: Secondary | ICD-10-CM | POA: Diagnosis not present

## 2020-01-13 DIAGNOSIS — N2581 Secondary hyperparathyroidism of renal origin: Secondary | ICD-10-CM | POA: Diagnosis not present

## 2020-01-13 DIAGNOSIS — D631 Anemia in chronic kidney disease: Secondary | ICD-10-CM | POA: Diagnosis not present

## 2020-01-16 DIAGNOSIS — D631 Anemia in chronic kidney disease: Secondary | ICD-10-CM | POA: Diagnosis not present

## 2020-01-16 DIAGNOSIS — D689 Coagulation defect, unspecified: Secondary | ICD-10-CM | POA: Diagnosis not present

## 2020-01-16 DIAGNOSIS — Z992 Dependence on renal dialysis: Secondary | ICD-10-CM | POA: Diagnosis not present

## 2020-01-16 DIAGNOSIS — N186 End stage renal disease: Secondary | ICD-10-CM | POA: Diagnosis not present

## 2020-01-16 DIAGNOSIS — N2581 Secondary hyperparathyroidism of renal origin: Secondary | ICD-10-CM | POA: Diagnosis not present

## 2020-01-16 DIAGNOSIS — R52 Pain, unspecified: Secondary | ICD-10-CM | POA: Diagnosis not present

## 2020-01-18 DIAGNOSIS — N186 End stage renal disease: Secondary | ICD-10-CM | POA: Diagnosis not present

## 2020-01-18 DIAGNOSIS — R52 Pain, unspecified: Secondary | ICD-10-CM | POA: Diagnosis not present

## 2020-01-18 DIAGNOSIS — Z992 Dependence on renal dialysis: Secondary | ICD-10-CM | POA: Diagnosis not present

## 2020-01-18 DIAGNOSIS — N2581 Secondary hyperparathyroidism of renal origin: Secondary | ICD-10-CM | POA: Diagnosis not present

## 2020-01-18 DIAGNOSIS — D689 Coagulation defect, unspecified: Secondary | ICD-10-CM | POA: Diagnosis not present

## 2020-01-18 DIAGNOSIS — D631 Anemia in chronic kidney disease: Secondary | ICD-10-CM | POA: Diagnosis not present

## 2020-01-20 DIAGNOSIS — N186 End stage renal disease: Secondary | ICD-10-CM | POA: Diagnosis not present

## 2020-01-20 DIAGNOSIS — N2581 Secondary hyperparathyroidism of renal origin: Secondary | ICD-10-CM | POA: Diagnosis not present

## 2020-01-20 DIAGNOSIS — D689 Coagulation defect, unspecified: Secondary | ICD-10-CM | POA: Diagnosis not present

## 2020-01-20 DIAGNOSIS — R52 Pain, unspecified: Secondary | ICD-10-CM | POA: Diagnosis not present

## 2020-01-20 DIAGNOSIS — Z992 Dependence on renal dialysis: Secondary | ICD-10-CM | POA: Diagnosis not present

## 2020-01-20 DIAGNOSIS — D631 Anemia in chronic kidney disease: Secondary | ICD-10-CM | POA: Diagnosis not present

## 2020-01-23 DIAGNOSIS — D631 Anemia in chronic kidney disease: Secondary | ICD-10-CM | POA: Diagnosis not present

## 2020-01-23 DIAGNOSIS — D689 Coagulation defect, unspecified: Secondary | ICD-10-CM | POA: Diagnosis not present

## 2020-01-23 DIAGNOSIS — R52 Pain, unspecified: Secondary | ICD-10-CM | POA: Diagnosis not present

## 2020-01-23 DIAGNOSIS — N2581 Secondary hyperparathyroidism of renal origin: Secondary | ICD-10-CM | POA: Diagnosis not present

## 2020-01-23 DIAGNOSIS — Z992 Dependence on renal dialysis: Secondary | ICD-10-CM | POA: Diagnosis not present

## 2020-01-23 DIAGNOSIS — N186 End stage renal disease: Secondary | ICD-10-CM | POA: Diagnosis not present

## 2020-01-25 DIAGNOSIS — D631 Anemia in chronic kidney disease: Secondary | ICD-10-CM | POA: Diagnosis not present

## 2020-01-25 DIAGNOSIS — N2581 Secondary hyperparathyroidism of renal origin: Secondary | ICD-10-CM | POA: Diagnosis not present

## 2020-01-25 DIAGNOSIS — D689 Coagulation defect, unspecified: Secondary | ICD-10-CM | POA: Diagnosis not present

## 2020-01-25 DIAGNOSIS — R52 Pain, unspecified: Secondary | ICD-10-CM | POA: Diagnosis not present

## 2020-01-25 DIAGNOSIS — Z992 Dependence on renal dialysis: Secondary | ICD-10-CM | POA: Diagnosis not present

## 2020-01-25 DIAGNOSIS — N186 End stage renal disease: Secondary | ICD-10-CM | POA: Diagnosis not present

## 2020-01-26 DIAGNOSIS — E1122 Type 2 diabetes mellitus with diabetic chronic kidney disease: Secondary | ICD-10-CM | POA: Diagnosis not present

## 2020-01-26 DIAGNOSIS — Z794 Long term (current) use of insulin: Secondary | ICD-10-CM | POA: Diagnosis not present

## 2020-01-26 DIAGNOSIS — E7800 Pure hypercholesterolemia, unspecified: Secondary | ICD-10-CM | POA: Diagnosis not present

## 2020-01-26 DIAGNOSIS — I10 Essential (primary) hypertension: Secondary | ICD-10-CM | POA: Diagnosis not present

## 2020-01-27 DIAGNOSIS — D689 Coagulation defect, unspecified: Secondary | ICD-10-CM | POA: Diagnosis not present

## 2020-01-27 DIAGNOSIS — Z992 Dependence on renal dialysis: Secondary | ICD-10-CM | POA: Diagnosis not present

## 2020-01-27 DIAGNOSIS — D631 Anemia in chronic kidney disease: Secondary | ICD-10-CM | POA: Diagnosis not present

## 2020-01-27 DIAGNOSIS — R52 Pain, unspecified: Secondary | ICD-10-CM | POA: Diagnosis not present

## 2020-01-27 DIAGNOSIS — N186 End stage renal disease: Secondary | ICD-10-CM | POA: Diagnosis not present

## 2020-01-27 DIAGNOSIS — N2581 Secondary hyperparathyroidism of renal origin: Secondary | ICD-10-CM | POA: Diagnosis not present

## 2020-01-30 DIAGNOSIS — Z992 Dependence on renal dialysis: Secondary | ICD-10-CM | POA: Diagnosis not present

## 2020-01-30 DIAGNOSIS — N2581 Secondary hyperparathyroidism of renal origin: Secondary | ICD-10-CM | POA: Diagnosis not present

## 2020-01-30 DIAGNOSIS — R52 Pain, unspecified: Secondary | ICD-10-CM | POA: Diagnosis not present

## 2020-01-30 DIAGNOSIS — N186 End stage renal disease: Secondary | ICD-10-CM | POA: Diagnosis not present

## 2020-01-30 DIAGNOSIS — D689 Coagulation defect, unspecified: Secondary | ICD-10-CM | POA: Diagnosis not present

## 2020-01-30 DIAGNOSIS — D631 Anemia in chronic kidney disease: Secondary | ICD-10-CM | POA: Diagnosis not present

## 2020-01-31 DIAGNOSIS — I129 Hypertensive chronic kidney disease with stage 1 through stage 4 chronic kidney disease, or unspecified chronic kidney disease: Secondary | ICD-10-CM | POA: Diagnosis not present

## 2020-01-31 DIAGNOSIS — N186 End stage renal disease: Secondary | ICD-10-CM | POA: Diagnosis not present

## 2020-01-31 DIAGNOSIS — Z992 Dependence on renal dialysis: Secondary | ICD-10-CM | POA: Diagnosis not present

## 2020-02-01 DIAGNOSIS — Z992 Dependence on renal dialysis: Secondary | ICD-10-CM | POA: Diagnosis not present

## 2020-02-01 DIAGNOSIS — N2581 Secondary hyperparathyroidism of renal origin: Secondary | ICD-10-CM | POA: Diagnosis not present

## 2020-02-01 DIAGNOSIS — D689 Coagulation defect, unspecified: Secondary | ICD-10-CM | POA: Diagnosis not present

## 2020-02-01 DIAGNOSIS — N186 End stage renal disease: Secondary | ICD-10-CM | POA: Diagnosis not present

## 2020-02-01 DIAGNOSIS — Z23 Encounter for immunization: Secondary | ICD-10-CM | POA: Diagnosis not present

## 2020-02-01 DIAGNOSIS — D631 Anemia in chronic kidney disease: Secondary | ICD-10-CM | POA: Diagnosis not present

## 2020-02-02 DIAGNOSIS — I10 Essential (primary) hypertension: Secondary | ICD-10-CM | POA: Diagnosis not present

## 2020-02-02 DIAGNOSIS — Z794 Long term (current) use of insulin: Secondary | ICD-10-CM | POA: Diagnosis not present

## 2020-02-02 DIAGNOSIS — E7800 Pure hypercholesterolemia, unspecified: Secondary | ICD-10-CM | POA: Diagnosis not present

## 2020-02-02 DIAGNOSIS — E1122 Type 2 diabetes mellitus with diabetic chronic kidney disease: Secondary | ICD-10-CM | POA: Diagnosis not present

## 2020-02-03 DIAGNOSIS — Z23 Encounter for immunization: Secondary | ICD-10-CM | POA: Diagnosis not present

## 2020-02-03 DIAGNOSIS — N186 End stage renal disease: Secondary | ICD-10-CM | POA: Diagnosis not present

## 2020-02-03 DIAGNOSIS — D631 Anemia in chronic kidney disease: Secondary | ICD-10-CM | POA: Diagnosis not present

## 2020-02-03 DIAGNOSIS — N2581 Secondary hyperparathyroidism of renal origin: Secondary | ICD-10-CM | POA: Diagnosis not present

## 2020-02-03 DIAGNOSIS — D689 Coagulation defect, unspecified: Secondary | ICD-10-CM | POA: Diagnosis not present

## 2020-02-03 DIAGNOSIS — Z992 Dependence on renal dialysis: Secondary | ICD-10-CM | POA: Diagnosis not present

## 2020-02-06 DIAGNOSIS — Z23 Encounter for immunization: Secondary | ICD-10-CM | POA: Diagnosis not present

## 2020-02-06 DIAGNOSIS — D689 Coagulation defect, unspecified: Secondary | ICD-10-CM | POA: Diagnosis not present

## 2020-02-06 DIAGNOSIS — Z992 Dependence on renal dialysis: Secondary | ICD-10-CM | POA: Diagnosis not present

## 2020-02-06 DIAGNOSIS — N186 End stage renal disease: Secondary | ICD-10-CM | POA: Diagnosis not present

## 2020-02-06 DIAGNOSIS — D631 Anemia in chronic kidney disease: Secondary | ICD-10-CM | POA: Diagnosis not present

## 2020-02-06 DIAGNOSIS — N2581 Secondary hyperparathyroidism of renal origin: Secondary | ICD-10-CM | POA: Diagnosis not present

## 2020-02-08 DIAGNOSIS — D631 Anemia in chronic kidney disease: Secondary | ICD-10-CM | POA: Diagnosis not present

## 2020-02-08 DIAGNOSIS — N186 End stage renal disease: Secondary | ICD-10-CM | POA: Diagnosis not present

## 2020-02-08 DIAGNOSIS — N2581 Secondary hyperparathyroidism of renal origin: Secondary | ICD-10-CM | POA: Diagnosis not present

## 2020-02-08 DIAGNOSIS — Z23 Encounter for immunization: Secondary | ICD-10-CM | POA: Diagnosis not present

## 2020-02-08 DIAGNOSIS — Z992 Dependence on renal dialysis: Secondary | ICD-10-CM | POA: Diagnosis not present

## 2020-02-08 DIAGNOSIS — D689 Coagulation defect, unspecified: Secondary | ICD-10-CM | POA: Diagnosis not present

## 2020-02-09 DIAGNOSIS — I10 Essential (primary) hypertension: Secondary | ICD-10-CM | POA: Diagnosis not present

## 2020-02-09 DIAGNOSIS — E1122 Type 2 diabetes mellitus with diabetic chronic kidney disease: Secondary | ICD-10-CM | POA: Diagnosis not present

## 2020-02-09 DIAGNOSIS — Z794 Long term (current) use of insulin: Secondary | ICD-10-CM | POA: Diagnosis not present

## 2020-02-09 DIAGNOSIS — E7800 Pure hypercholesterolemia, unspecified: Secondary | ICD-10-CM | POA: Diagnosis not present

## 2020-02-10 DIAGNOSIS — D689 Coagulation defect, unspecified: Secondary | ICD-10-CM | POA: Diagnosis not present

## 2020-02-10 DIAGNOSIS — N2581 Secondary hyperparathyroidism of renal origin: Secondary | ICD-10-CM | POA: Diagnosis not present

## 2020-02-10 DIAGNOSIS — D631 Anemia in chronic kidney disease: Secondary | ICD-10-CM | POA: Diagnosis not present

## 2020-02-10 DIAGNOSIS — Z23 Encounter for immunization: Secondary | ICD-10-CM | POA: Diagnosis not present

## 2020-02-10 DIAGNOSIS — Z992 Dependence on renal dialysis: Secondary | ICD-10-CM | POA: Diagnosis not present

## 2020-02-10 DIAGNOSIS — N186 End stage renal disease: Secondary | ICD-10-CM | POA: Diagnosis not present

## 2020-02-13 DIAGNOSIS — Z992 Dependence on renal dialysis: Secondary | ICD-10-CM | POA: Diagnosis not present

## 2020-02-13 DIAGNOSIS — D631 Anemia in chronic kidney disease: Secondary | ICD-10-CM | POA: Diagnosis not present

## 2020-02-13 DIAGNOSIS — N186 End stage renal disease: Secondary | ICD-10-CM | POA: Diagnosis not present

## 2020-02-13 DIAGNOSIS — Z23 Encounter for immunization: Secondary | ICD-10-CM | POA: Diagnosis not present

## 2020-02-13 DIAGNOSIS — D689 Coagulation defect, unspecified: Secondary | ICD-10-CM | POA: Diagnosis not present

## 2020-02-13 DIAGNOSIS — N2581 Secondary hyperparathyroidism of renal origin: Secondary | ICD-10-CM | POA: Diagnosis not present

## 2020-02-15 DIAGNOSIS — N2581 Secondary hyperparathyroidism of renal origin: Secondary | ICD-10-CM | POA: Diagnosis not present

## 2020-02-15 DIAGNOSIS — Z992 Dependence on renal dialysis: Secondary | ICD-10-CM | POA: Diagnosis not present

## 2020-02-15 DIAGNOSIS — D689 Coagulation defect, unspecified: Secondary | ICD-10-CM | POA: Diagnosis not present

## 2020-02-15 DIAGNOSIS — N186 End stage renal disease: Secondary | ICD-10-CM | POA: Diagnosis not present

## 2020-02-15 DIAGNOSIS — Z23 Encounter for immunization: Secondary | ICD-10-CM | POA: Diagnosis not present

## 2020-02-15 DIAGNOSIS — D631 Anemia in chronic kidney disease: Secondary | ICD-10-CM | POA: Diagnosis not present

## 2020-02-17 DIAGNOSIS — Z23 Encounter for immunization: Secondary | ICD-10-CM | POA: Diagnosis not present

## 2020-02-17 DIAGNOSIS — D631 Anemia in chronic kidney disease: Secondary | ICD-10-CM | POA: Diagnosis not present

## 2020-02-17 DIAGNOSIS — N186 End stage renal disease: Secondary | ICD-10-CM | POA: Diagnosis not present

## 2020-02-17 DIAGNOSIS — D689 Coagulation defect, unspecified: Secondary | ICD-10-CM | POA: Diagnosis not present

## 2020-02-17 DIAGNOSIS — Z992 Dependence on renal dialysis: Secondary | ICD-10-CM | POA: Diagnosis not present

## 2020-02-17 DIAGNOSIS — N2581 Secondary hyperparathyroidism of renal origin: Secondary | ICD-10-CM | POA: Diagnosis not present

## 2020-02-20 DIAGNOSIS — Z23 Encounter for immunization: Secondary | ICD-10-CM | POA: Diagnosis not present

## 2020-02-20 DIAGNOSIS — D689 Coagulation defect, unspecified: Secondary | ICD-10-CM | POA: Diagnosis not present

## 2020-02-20 DIAGNOSIS — N186 End stage renal disease: Secondary | ICD-10-CM | POA: Diagnosis not present

## 2020-02-20 DIAGNOSIS — Z992 Dependence on renal dialysis: Secondary | ICD-10-CM | POA: Diagnosis not present

## 2020-02-20 DIAGNOSIS — D631 Anemia in chronic kidney disease: Secondary | ICD-10-CM | POA: Diagnosis not present

## 2020-02-20 DIAGNOSIS — N2581 Secondary hyperparathyroidism of renal origin: Secondary | ICD-10-CM | POA: Diagnosis not present

## 2020-02-22 DIAGNOSIS — D631 Anemia in chronic kidney disease: Secondary | ICD-10-CM | POA: Diagnosis not present

## 2020-02-22 DIAGNOSIS — Z992 Dependence on renal dialysis: Secondary | ICD-10-CM | POA: Diagnosis not present

## 2020-02-22 DIAGNOSIS — Z23 Encounter for immunization: Secondary | ICD-10-CM | POA: Diagnosis not present

## 2020-02-22 DIAGNOSIS — D689 Coagulation defect, unspecified: Secondary | ICD-10-CM | POA: Diagnosis not present

## 2020-02-22 DIAGNOSIS — N2581 Secondary hyperparathyroidism of renal origin: Secondary | ICD-10-CM | POA: Diagnosis not present

## 2020-02-22 DIAGNOSIS — N186 End stage renal disease: Secondary | ICD-10-CM | POA: Diagnosis not present

## 2020-02-24 DIAGNOSIS — N186 End stage renal disease: Secondary | ICD-10-CM | POA: Diagnosis not present

## 2020-02-24 DIAGNOSIS — Z23 Encounter for immunization: Secondary | ICD-10-CM | POA: Diagnosis not present

## 2020-02-24 DIAGNOSIS — N2581 Secondary hyperparathyroidism of renal origin: Secondary | ICD-10-CM | POA: Diagnosis not present

## 2020-02-24 DIAGNOSIS — D631 Anemia in chronic kidney disease: Secondary | ICD-10-CM | POA: Diagnosis not present

## 2020-02-24 DIAGNOSIS — D689 Coagulation defect, unspecified: Secondary | ICD-10-CM | POA: Diagnosis not present

## 2020-02-24 DIAGNOSIS — Z992 Dependence on renal dialysis: Secondary | ICD-10-CM | POA: Diagnosis not present

## 2020-02-27 DIAGNOSIS — N2581 Secondary hyperparathyroidism of renal origin: Secondary | ICD-10-CM | POA: Diagnosis not present

## 2020-02-27 DIAGNOSIS — N186 End stage renal disease: Secondary | ICD-10-CM | POA: Diagnosis not present

## 2020-02-27 DIAGNOSIS — Z23 Encounter for immunization: Secondary | ICD-10-CM | POA: Diagnosis not present

## 2020-02-27 DIAGNOSIS — D631 Anemia in chronic kidney disease: Secondary | ICD-10-CM | POA: Diagnosis not present

## 2020-02-27 DIAGNOSIS — D689 Coagulation defect, unspecified: Secondary | ICD-10-CM | POA: Diagnosis not present

## 2020-02-27 DIAGNOSIS — Z992 Dependence on renal dialysis: Secondary | ICD-10-CM | POA: Diagnosis not present

## 2020-02-28 DIAGNOSIS — N2581 Secondary hyperparathyroidism of renal origin: Secondary | ICD-10-CM | POA: Diagnosis not present

## 2020-02-28 DIAGNOSIS — E7800 Pure hypercholesterolemia, unspecified: Secondary | ICD-10-CM | POA: Diagnosis not present

## 2020-02-28 DIAGNOSIS — D689 Coagulation defect, unspecified: Secondary | ICD-10-CM | POA: Diagnosis not present

## 2020-02-28 DIAGNOSIS — I10 Essential (primary) hypertension: Secondary | ICD-10-CM | POA: Diagnosis not present

## 2020-02-28 DIAGNOSIS — E1122 Type 2 diabetes mellitus with diabetic chronic kidney disease: Secondary | ICD-10-CM | POA: Diagnosis not present

## 2020-02-28 DIAGNOSIS — Z992 Dependence on renal dialysis: Secondary | ICD-10-CM | POA: Diagnosis not present

## 2020-02-28 DIAGNOSIS — Z794 Long term (current) use of insulin: Secondary | ICD-10-CM | POA: Diagnosis not present

## 2020-02-28 DIAGNOSIS — N186 End stage renal disease: Secondary | ICD-10-CM | POA: Diagnosis not present

## 2020-02-28 DIAGNOSIS — Z23 Encounter for immunization: Secondary | ICD-10-CM | POA: Diagnosis not present

## 2020-02-28 DIAGNOSIS — D631 Anemia in chronic kidney disease: Secondary | ICD-10-CM | POA: Diagnosis not present

## 2020-02-29 DIAGNOSIS — Z992 Dependence on renal dialysis: Secondary | ICD-10-CM | POA: Diagnosis not present

## 2020-02-29 DIAGNOSIS — D689 Coagulation defect, unspecified: Secondary | ICD-10-CM | POA: Diagnosis not present

## 2020-02-29 DIAGNOSIS — Z23 Encounter for immunization: Secondary | ICD-10-CM | POA: Diagnosis not present

## 2020-02-29 DIAGNOSIS — D631 Anemia in chronic kidney disease: Secondary | ICD-10-CM | POA: Diagnosis not present

## 2020-02-29 DIAGNOSIS — N186 End stage renal disease: Secondary | ICD-10-CM | POA: Diagnosis not present

## 2020-02-29 DIAGNOSIS — N2581 Secondary hyperparathyroidism of renal origin: Secondary | ICD-10-CM | POA: Diagnosis not present

## 2020-03-01 DIAGNOSIS — N186 End stage renal disease: Secondary | ICD-10-CM | POA: Diagnosis not present

## 2020-03-01 DIAGNOSIS — Z992 Dependence on renal dialysis: Secondary | ICD-10-CM | POA: Diagnosis not present

## 2020-03-01 DIAGNOSIS — I129 Hypertensive chronic kidney disease with stage 1 through stage 4 chronic kidney disease, or unspecified chronic kidney disease: Secondary | ICD-10-CM | POA: Diagnosis not present

## 2020-03-02 DIAGNOSIS — D631 Anemia in chronic kidney disease: Secondary | ICD-10-CM | POA: Diagnosis not present

## 2020-03-02 DIAGNOSIS — N186 End stage renal disease: Secondary | ICD-10-CM | POA: Diagnosis not present

## 2020-03-02 DIAGNOSIS — Z992 Dependence on renal dialysis: Secondary | ICD-10-CM | POA: Diagnosis not present

## 2020-03-02 DIAGNOSIS — D689 Coagulation defect, unspecified: Secondary | ICD-10-CM | POA: Diagnosis not present

## 2020-03-02 DIAGNOSIS — N2581 Secondary hyperparathyroidism of renal origin: Secondary | ICD-10-CM | POA: Diagnosis not present

## 2020-03-05 DIAGNOSIS — Z992 Dependence on renal dialysis: Secondary | ICD-10-CM | POA: Diagnosis not present

## 2020-03-05 DIAGNOSIS — D631 Anemia in chronic kidney disease: Secondary | ICD-10-CM | POA: Diagnosis not present

## 2020-03-05 DIAGNOSIS — D689 Coagulation defect, unspecified: Secondary | ICD-10-CM | POA: Diagnosis not present

## 2020-03-05 DIAGNOSIS — N186 End stage renal disease: Secondary | ICD-10-CM | POA: Diagnosis not present

## 2020-03-05 DIAGNOSIS — N2581 Secondary hyperparathyroidism of renal origin: Secondary | ICD-10-CM | POA: Diagnosis not present

## 2020-03-06 DIAGNOSIS — Z794 Long term (current) use of insulin: Secondary | ICD-10-CM | POA: Diagnosis not present

## 2020-03-06 DIAGNOSIS — E1122 Type 2 diabetes mellitus with diabetic chronic kidney disease: Secondary | ICD-10-CM | POA: Diagnosis not present

## 2020-03-06 DIAGNOSIS — E7800 Pure hypercholesterolemia, unspecified: Secondary | ICD-10-CM | POA: Diagnosis not present

## 2020-03-06 DIAGNOSIS — I10 Essential (primary) hypertension: Secondary | ICD-10-CM | POA: Diagnosis not present

## 2020-03-07 DIAGNOSIS — D631 Anemia in chronic kidney disease: Secondary | ICD-10-CM | POA: Diagnosis not present

## 2020-03-07 DIAGNOSIS — D689 Coagulation defect, unspecified: Secondary | ICD-10-CM | POA: Diagnosis not present

## 2020-03-07 DIAGNOSIS — N2581 Secondary hyperparathyroidism of renal origin: Secondary | ICD-10-CM | POA: Diagnosis not present

## 2020-03-07 DIAGNOSIS — Z992 Dependence on renal dialysis: Secondary | ICD-10-CM | POA: Diagnosis not present

## 2020-03-07 DIAGNOSIS — N186 End stage renal disease: Secondary | ICD-10-CM | POA: Diagnosis not present

## 2020-03-09 DIAGNOSIS — D631 Anemia in chronic kidney disease: Secondary | ICD-10-CM | POA: Diagnosis not present

## 2020-03-09 DIAGNOSIS — N2581 Secondary hyperparathyroidism of renal origin: Secondary | ICD-10-CM | POA: Diagnosis not present

## 2020-03-09 DIAGNOSIS — Z992 Dependence on renal dialysis: Secondary | ICD-10-CM | POA: Diagnosis not present

## 2020-03-09 DIAGNOSIS — D689 Coagulation defect, unspecified: Secondary | ICD-10-CM | POA: Diagnosis not present

## 2020-03-09 DIAGNOSIS — N186 End stage renal disease: Secondary | ICD-10-CM | POA: Diagnosis not present

## 2020-03-12 DIAGNOSIS — Z992 Dependence on renal dialysis: Secondary | ICD-10-CM | POA: Diagnosis not present

## 2020-03-12 DIAGNOSIS — D689 Coagulation defect, unspecified: Secondary | ICD-10-CM | POA: Diagnosis not present

## 2020-03-12 DIAGNOSIS — N186 End stage renal disease: Secondary | ICD-10-CM | POA: Diagnosis not present

## 2020-03-12 DIAGNOSIS — D631 Anemia in chronic kidney disease: Secondary | ICD-10-CM | POA: Diagnosis not present

## 2020-03-12 DIAGNOSIS — N2581 Secondary hyperparathyroidism of renal origin: Secondary | ICD-10-CM | POA: Diagnosis not present

## 2020-03-13 DIAGNOSIS — M109 Gout, unspecified: Secondary | ICD-10-CM | POA: Diagnosis not present

## 2020-03-13 DIAGNOSIS — Z7689 Persons encountering health services in other specified circumstances: Secondary | ICD-10-CM | POA: Diagnosis not present

## 2020-03-13 DIAGNOSIS — E1122 Type 2 diabetes mellitus with diabetic chronic kidney disease: Secondary | ICD-10-CM | POA: Diagnosis not present

## 2020-03-13 DIAGNOSIS — Z8739 Personal history of other diseases of the musculoskeletal system and connective tissue: Secondary | ICD-10-CM | POA: Diagnosis not present

## 2020-03-13 DIAGNOSIS — E7800 Pure hypercholesterolemia, unspecified: Secondary | ICD-10-CM | POA: Diagnosis not present

## 2020-03-13 DIAGNOSIS — E118 Type 2 diabetes mellitus with unspecified complications: Secondary | ICD-10-CM | POA: Diagnosis not present

## 2020-03-13 DIAGNOSIS — I10 Essential (primary) hypertension: Secondary | ICD-10-CM | POA: Diagnosis not present

## 2020-03-14 DIAGNOSIS — D689 Coagulation defect, unspecified: Secondary | ICD-10-CM | POA: Diagnosis not present

## 2020-03-14 DIAGNOSIS — Z992 Dependence on renal dialysis: Secondary | ICD-10-CM | POA: Diagnosis not present

## 2020-03-14 DIAGNOSIS — N186 End stage renal disease: Secondary | ICD-10-CM | POA: Diagnosis not present

## 2020-03-14 DIAGNOSIS — D631 Anemia in chronic kidney disease: Secondary | ICD-10-CM | POA: Diagnosis not present

## 2020-03-14 DIAGNOSIS — N2581 Secondary hyperparathyroidism of renal origin: Secondary | ICD-10-CM | POA: Diagnosis not present

## 2020-03-15 DIAGNOSIS — E119 Type 2 diabetes mellitus without complications: Secondary | ICD-10-CM | POA: Diagnosis not present

## 2020-03-16 DIAGNOSIS — D631 Anemia in chronic kidney disease: Secondary | ICD-10-CM | POA: Diagnosis not present

## 2020-03-16 DIAGNOSIS — D689 Coagulation defect, unspecified: Secondary | ICD-10-CM | POA: Diagnosis not present

## 2020-03-16 DIAGNOSIS — Z992 Dependence on renal dialysis: Secondary | ICD-10-CM | POA: Diagnosis not present

## 2020-03-16 DIAGNOSIS — N2581 Secondary hyperparathyroidism of renal origin: Secondary | ICD-10-CM | POA: Diagnosis not present

## 2020-03-16 DIAGNOSIS — N186 End stage renal disease: Secondary | ICD-10-CM | POA: Diagnosis not present

## 2020-03-19 DIAGNOSIS — N186 End stage renal disease: Secondary | ICD-10-CM | POA: Diagnosis not present

## 2020-03-19 DIAGNOSIS — Z992 Dependence on renal dialysis: Secondary | ICD-10-CM | POA: Diagnosis not present

## 2020-03-19 DIAGNOSIS — N2581 Secondary hyperparathyroidism of renal origin: Secondary | ICD-10-CM | POA: Diagnosis not present

## 2020-03-19 DIAGNOSIS — D631 Anemia in chronic kidney disease: Secondary | ICD-10-CM | POA: Diagnosis not present

## 2020-03-19 DIAGNOSIS — D689 Coagulation defect, unspecified: Secondary | ICD-10-CM | POA: Diagnosis not present

## 2020-03-20 DIAGNOSIS — E1122 Type 2 diabetes mellitus with diabetic chronic kidney disease: Secondary | ICD-10-CM | POA: Diagnosis not present

## 2020-03-20 DIAGNOSIS — E789 Disorder of lipoprotein metabolism, unspecified: Secondary | ICD-10-CM | POA: Diagnosis not present

## 2020-03-20 DIAGNOSIS — N186 End stage renal disease: Secondary | ICD-10-CM | POA: Diagnosis not present

## 2020-03-20 DIAGNOSIS — I10 Essential (primary) hypertension: Secondary | ICD-10-CM | POA: Diagnosis not present

## 2020-03-20 DIAGNOSIS — Z992 Dependence on renal dialysis: Secondary | ICD-10-CM | POA: Diagnosis not present

## 2020-03-21 DIAGNOSIS — N2581 Secondary hyperparathyroidism of renal origin: Secondary | ICD-10-CM | POA: Diagnosis not present

## 2020-03-21 DIAGNOSIS — Z992 Dependence on renal dialysis: Secondary | ICD-10-CM | POA: Diagnosis not present

## 2020-03-21 DIAGNOSIS — D689 Coagulation defect, unspecified: Secondary | ICD-10-CM | POA: Diagnosis not present

## 2020-03-21 DIAGNOSIS — D631 Anemia in chronic kidney disease: Secondary | ICD-10-CM | POA: Diagnosis not present

## 2020-03-21 DIAGNOSIS — N186 End stage renal disease: Secondary | ICD-10-CM | POA: Diagnosis not present

## 2020-03-23 DIAGNOSIS — D689 Coagulation defect, unspecified: Secondary | ICD-10-CM | POA: Diagnosis not present

## 2020-03-23 DIAGNOSIS — N2581 Secondary hyperparathyroidism of renal origin: Secondary | ICD-10-CM | POA: Diagnosis not present

## 2020-03-23 DIAGNOSIS — N186 End stage renal disease: Secondary | ICD-10-CM | POA: Diagnosis not present

## 2020-03-23 DIAGNOSIS — D631 Anemia in chronic kidney disease: Secondary | ICD-10-CM | POA: Diagnosis not present

## 2020-03-23 DIAGNOSIS — Z992 Dependence on renal dialysis: Secondary | ICD-10-CM | POA: Diagnosis not present

## 2020-03-26 DIAGNOSIS — D631 Anemia in chronic kidney disease: Secondary | ICD-10-CM | POA: Diagnosis not present

## 2020-03-26 DIAGNOSIS — N186 End stage renal disease: Secondary | ICD-10-CM | POA: Diagnosis not present

## 2020-03-26 DIAGNOSIS — Z992 Dependence on renal dialysis: Secondary | ICD-10-CM | POA: Diagnosis not present

## 2020-03-26 DIAGNOSIS — N2581 Secondary hyperparathyroidism of renal origin: Secondary | ICD-10-CM | POA: Diagnosis not present

## 2020-03-26 DIAGNOSIS — D689 Coagulation defect, unspecified: Secondary | ICD-10-CM | POA: Diagnosis not present

## 2020-03-27 ENCOUNTER — Ambulatory Visit (INDEPENDENT_AMBULATORY_CARE_PROVIDER_SITE_OTHER): Payer: Medicare Other | Admitting: Cardiovascular Disease

## 2020-03-27 ENCOUNTER — Encounter: Payer: Self-pay | Admitting: Cardiovascular Disease

## 2020-03-27 ENCOUNTER — Other Ambulatory Visit: Payer: Self-pay

## 2020-03-27 DIAGNOSIS — E785 Hyperlipidemia, unspecified: Secondary | ICD-10-CM | POA: Diagnosis not present

## 2020-03-27 DIAGNOSIS — G4733 Obstructive sleep apnea (adult) (pediatric): Secondary | ICD-10-CM

## 2020-03-27 DIAGNOSIS — Z992 Dependence on renal dialysis: Secondary | ICD-10-CM

## 2020-03-27 DIAGNOSIS — Z794 Long term (current) use of insulin: Secondary | ICD-10-CM | POA: Diagnosis not present

## 2020-03-27 DIAGNOSIS — N186 End stage renal disease: Secondary | ICD-10-CM | POA: Diagnosis not present

## 2020-03-27 DIAGNOSIS — I5032 Chronic diastolic (congestive) heart failure: Secondary | ICD-10-CM

## 2020-03-27 DIAGNOSIS — E1121 Type 2 diabetes mellitus with diabetic nephropathy: Secondary | ICD-10-CM | POA: Diagnosis not present

## 2020-03-27 NOTE — Progress Notes (Signed)
Cardiology Office Note    Date:  03/27/2020   ID:  Laura, Horn [birth year], MRN [id number]  PCP:  Laura Pretty, MD  Cardiologist:  Shelva Majestic, MD   Chief Complaint  Patient presents with  . Follow-up    History of Present Illness:  [name] is a 64 y.o. female who referred by Dr. Wilson Horn for me to assume care of her obstructive sleep apnea.  She had established cardiology care with me in March 2018. I last saw her in July 2020.Marland Kitchen  She presents for a 15 month follow-up cardiology/sleep evaluation.  [name] was seen by Dr. Irish Horn in May 2017.  She has a history of hypertension, hyperlipidemia, obstructive sleep apnea, stage IV renal disease and was not yet on dialysis.  She has had heart failure symptoms and volume overload.  She was admitted to [facility] hospital in May 2017 for acute on chronic heart failure in the setting of worsening renal failure.  During that hospitalization, troponins were elevated.  An echo Doppler study showed an EF of 55-60%, severe LVH, and mild PA hypertension.  She was started on hemodialysis and required AV fistula revision.  She was transfused due to worsening edema and ultimately was hospitalized for close to one month and later discharged to skilled nursing facility.  She was seen in follow-up in the office and was tachycardic.  She was last seen in August 2017 by Laura Main, PA and was having some mild dizziness after dialysis. She was instructed to hold the night dose of Toprol prior to her dialysis session.  The patient has requested establishment of care with me and presents for evaluation.  She denies any episodes of chest tightness.  She denies recent episodes of dizziness.  Her volume overload has resolved with implementation of dialysis.  She has a history of obstructive sleep apnea and was followed remotely at [facility] by [name].  She had undergone a sleep study at [place] in 2009.  Overall AHI was 12.8, but she  had severe sleep apnea with rim sleep at 43.9.  Remotely, she had been set at a 14 cm pressure and with [facility] as her DME.  Her last download was in 2012.  She had not been using CPAP, regular labor recently has increased usage.  Without CPAP therapy.  She would wake up every 2 hours.  Her sleep has improved with reinstitution of treatment.  She typically goes to bed at 11 PM and wakes up at 7 AM.  She is followed by Dr. Florene Horn for renal disease.  When I saw her in March 2018 she continued to have excessive daytime sleepiness and an Epworth Sleepiness Scale score endorsed at 13.  Epworth Sleepiness Scale: Situation   Chance of Dozing/Sleeping (0 = never , 1 = slight chance , 2 = moderate chance , 3 = high chance )   sitting and reading 2   watching TV 3   sitting inactive in a public place 1   being a passenger in a motor vehicle for an hour or more 1   lying down in the afternoon 2   sitting and talking to someone 1   sitting quietly after lunch (no alcohol) 2   while stopped for a few minutes in traffic as the driver 1   Total Score  13   Since it had been over 9 years since her initial sleep evaluation.  I recommended a new sleep evaluation  and machine.  The sleep study was done on 01/29/2017 and showed moderate sleep apnea were all within AHI of 19.7 per hour.  However, her sleep apnea was very severe doing REM sleep with an HI of 43.6 per hour.  She had significant oxygen desaturation to a nadir of 81% and there was moderate snoring.     She underwent her CPAP titration study on April 26, 2017.  Her new CPAP set up date was June 15, 2017 and [facility] is her DME company.  When I saw her in June 2019 she was sleeping much better.  Her lactate she has a Fisher-[employer] Simplus medium full facemask.  A new download was obtained in the office today from Oct 07, 2017 through November 05, 2017.  She is 100% compliant.  However sleep duration iwas only 5 hours and 25 minutes.  AHI is  3.4 on her 9 cm water pressure.  There is no leak.  Typically she goes to bed at 10:30 but wakes up at 3:45 on dialysis days.  As result she is not sleeping for long enough duration.  She was unaware of breakthrough snoring.  I last saw her in July 2020.  Over the prior year she had  continued to feel significantly improved and has remained compliant with CPAP therapy.  A  download was obtained from June 2 through December 01, 2018 which shows 100% compliance.  However, usage days was only 5 hours and 11 minutes.  Typically she has been going to bed at 10 PM and wakes up at 3:30 AM on dialysis mornings.  Oftentimes on the night prior to nondialysis days she has been going to bed later but sleeping longer in the morning.  She was on a set pressure of 9 cm.  AHI is 3.0.  She denied any snoring.  She iasmore alert.  A new Epworth Sleepiness Scale score was calculated today in the office and this endorsed at 12 which is consistent with mild residual hypersomnolence, most likely due to her reduced sleep duration rather than CPAP efficacy.  During that evaluation I recommended slight increase in CPAP pressure up to 10 cm.  Since I last saw her, she has continued to do well.  She continues to be on dialysis on Monday Wednesday and Friday.  Since Dr. Florene Horn is no longer practicing in the group, she has been seen either: [name] or Dr. Hollie Horn at the dialysis center.  She has tried to increase her sleep duration compared to a year ago.  She typically goes to bed at 8 PM but has to wake up at 2 AM particularly on dialysis days.  She states that she is very slow to get dressed and ultimately has to be at the bus at 445 to take her to dialysis.  On nondialysis days at times she still gets up early but may sleep a little bit later.  She denies any chest pain PND orthopnea.  She fell on a reduced dose of metoprolol succinate at 12.5 mg every other day and does not take the night prior to her dialysis.  She has been on atorvastatin 70  mg for hyperlipidemia.  Recent LDL cholesterol was 79 March 13, 2020.  She has history of gout currently on uloric.  She is diabetic on Tradjenta and Tresiba FlexTouch insulin.  She presents for evaluation.  Past Medical History:  Diagnosis Date  . Arthritis   . Chronic diastolic CHF (congestive heart failure) ([place])    a.  Echo 5/17: severe LVH, EF 55-60%, mild AS, mean AV gradient 10 mmHg, mod LAE, PASP 37 mmHg  . Diabetes mellitus without complication ([place])    type 2  . ESRD (end stage renal disease) ([facility])    Tues, Thurs, Sat dialysis  . H/O blood clots    many years ago in leg  . History of pneumonia   . HLD (hyperlipidemia)   . Hypertension    BP low after dialysis started, no further BP meds  . Seizures ([place])    had one or two (more than 10-15 years ago)  . Sleep apnea    uses c-pap most of time    Past Surgical History:  Procedure Laterality Date  . ABDOMINAL HYSTERECTOMY    . AV FISTULA PLACEMENT Left 07/25/2015   Procedure: ARTERIOVENOUS (AV) FISTULA CREATION -LEFT BRACHIOCEPHALIC;  Surgeon: Conrad Garysburg, MD;  Location: [facility];  Service: Vascular;  Laterality: Left;  . COLONOSCOPY    . COLONOSCOPY WITH PROPOFOL N/A 11/18/2017   Procedure: COLONOSCOPY WITH PROPOFOL;  Surgeon: Arta Silence, MD;  Location: [facility];  Service: Endoscopy;  Laterality: N/A;  . FISTULA SUPERFICIALIZATION Left 11/01/2015   Procedure: SUPERFICIALIZATION OF LEFT ARM BRACHIOCEPHALIC FISTULA ;  Surgeon: Conrad Cayuse, MD;  Location: [facility];  Service: Vascular;  Laterality: Left;  . INSERTION OF DIALYSIS CATHETER Right 10/09/2015   Procedure: INSERTION OF DIALYSIS CATHETER RIGHT INTERNAL JUGULAR ;  Surgeon: Angelia Mould, MD;  Location: [facility];  Service: Vascular;  Laterality: Right;  . POLYPECTOMY  11/18/2017   Procedure: POLYPECTOMY;  Surgeon: Arta Silence, MD;  Location: [facility];  Service: Endoscopy;;    Current Medications: Outpatient Medications Prior to Visit  Medication Sig  Dispense Refill  . aspirin 81 MG tablet Take 81 mg by mouth daily.    Marland Kitchen atorvastatin (LIPITOR) 40 MG tablet Take 40 mg by mouth daily.    . cinacalcet (SENSIPAR) 60 MG tablet Take 60 mg by mouth daily.     Marland Kitchen docusate sodium (COLACE) 50 MG capsule Take 100 mg by mouth 2 (two) times daily.     . febuxostat (ULORIC) 40 MG tablet Take 40 mg by mouth daily.    Marland Kitchen FOSRENOL 1000 MG chewable tablet Chew 1,000 mg by mouth 3 (three) times daily with meals.    . lidocaine-prilocaine (EMLA) cream Apply 1 application topically as needed (local anesthesia).    . loratadine (CLARITIN) 10 MG tablet Take 1 tablet by mouth daily.     . metoprolol succinate (TOPROL-XL) 25 MG 24 hr tablet Take 12.5 mg by mouth every other day. DO NOT TAKE THE NIGHT PRIOR TO DIALYSIS    . multivitamin (RENA-VIT) TABS tablet Take 1 tablet by mouth daily. 30 each 0  . ONETOUCH VERIO test strip TEST 2 TIMES A DAY AND AS NEEDED FOR BLOOD SUGAR  0  . TRADJENTA 5 MG TABS tablet     . TRESIBA FLEXTOUCH 100 UNIT/ML SOPN FlexTouch Pen Inject 14 Units into the skin daily. Dinner  0  . metoprolol succinate (TOPROL-XL) 50 MG 24 hr tablet Take 12.5 mg by mouth every other day. DO NOT TAKE THE NIGHT PRIOR TO YOUR DIALYSIS  0   No facility-administered medications prior to visit.     Allergies:   Morphine and related   Social History   Socioeconomic History  . Marital status: Single    Spouse name: Not on file  . Number of children: Not on file  . Years of education:  Not on file  . Highest education level: Not on file  Occupational History  . Not on file  Tobacco Use  . Smoking status: Never Smoker  . Smokeless tobacco: Never Used  Vaping Use  . Vaping Use: Never used  Substance and Sexual Activity  . Alcohol use: No    Alcohol/week: 0.0 standard drinks  . Drug use: No  . Sexual activity: Not on file  Other Topics Concern  . Not on file  Social History Narrative   epworth sleepiness scale score 12   Social Determinants of  Health   Financial Resource Strain:   . Difficulty of Paying Living Expenses: Not on file  Food Insecurity:   . Worried About Charity fundraiser in the Last Year: Not on file  . Ran Out of Food in the Last Year: Not on file  Transportation Needs:   . Horn of Transportation (Medical): Not on file  . Horn of Transportation (Non-Medical): Not on file  Physical Activity:   . Days of Exercise per Week: Not on file  . Minutes of Exercise per Session: Not on file  Stress:   . Feeling of Stress : Not on file  Social Connections:   . Frequency of Communication with Friends and Family: Not on file  . Frequency of Social Gatherings with Friends and Family: Not on file  . Attends Religious Services: Not on file  . Active Member of Clubs or Organizations: Not on file  . Attends Archivist Meetings: Not on file  . Marital Status: Not on file    I have taken care of her family member, [name]  Family History:  The patient's family history includes Cancer in her mother; Heart attack in her paternal aunt and sister; Hypertension in her brother, brother, father, sister, sister, sister, and sister.   ROS General: Negative; No fevers, chills, or night sweats; positive for super morbid obesity HEENT: Negative; No changes in vision or hearing, sinus congestion, difficulty swallowing Pulmonary: Negative; No cough, wheezing, shortness of breath, hemoptysis Cardiovascular: Negative; No chest pain, presyncope, syncope, palpitations GI: Negative; No nausea, vomiting, diarrhea, or abdominal pain GU: End-stage renal disease, now on dialysis on Monday Wednesday and Fridays Musculoskeletal: Negative; no myalgias, joint pain, or weakness Hematologic/Oncology: Negative; no easy bruising, bleeding Endocrine: Positive for diabetes mellitus Neuro: Negative; no changes in balance, headaches Skin: Negative; No rashes or skin lesions Psychiatric: Negative; No behavioral problems, depression Sleep:  Positive for OSA documented in 2009.  No awareness of breakthrough snoring; no bruxism, restless legs, hypnogognic hallucinations, no cataplexy.  New sleep study was reviewed.  CPAP titration is outstanding Other comprehensive 14 point system review is negative.   PHYSICAL EXAM:   VS:  BP (!) 102/59   Pulse 88   Ht '5\' 6"'  (1.676 m)   Wt (!) 330 lb (149.7 kg)   SpO2 95%   BMI 53.26 kg/m     Repeat blood pressure by me was 110/62  Wt Readings from Last 3 Encounters:  03/27/20 (!) 330 lb (149.7 kg)  12/02/18 (!) 336 lb 9.6 oz (152.7 kg)  11/18/17 (!) 344 lb (156 kg)    General: Alert, oriented, no distress.  Super morbid obesity but with weight loss from 336 to 330 since her evaluation 15 months ago Skin: normal turgor, no rashes, warm and dry HEENT: Normocephalic, atraumatic. Pupils equal round and reactive to light; sclera anicteric; extraocular muscles intact;  Nose without nasal septal hypertrophy Mouth/Parynx benign; Mallinpatti scale  3/4 Neck: Thick neck no JVD, no carotid bruits; normal carotid upstroke Lungs: clear to ausculatation and percussion; no wheezing or rales Chest wall: without tenderness to palpitation Heart: PMI not displaced, RRR, s1 s2 normal, 1/6 systolic murmur, no diastolic murmur, no rubs, gallops, thrills, or heaves Abdomen: Central adiposity;soft, nontender; no hepatosplenomehaly, BS+; abdominal aorta nontender and not dilated by palpation. Back: no CVA tenderness Pulses 2+ Musculoskeletal: full range of motion, normal strength, no joint deformities Extremities: AV fistula left upper extremity.  Venous stasis changes in lower extremity; no clubbing cyanosis or edema, Homan's sign negative  Neurologic: grossly nonfocal; Cranial nerves grossly wnl Psychologic: Normal mood and affect   Studies/Labs Reviewed:   ECG (independently read by me): NSR at 88; Poor anterior R wave progression; Q wave in III.  No ECG obtained on 12/02/2018; multiple attempts were  made but due to the patient's body habitus with significant inframammary perspiration; suboptimal quality  June 2019 ECG (independently read by me): Sinus rhythm at 77 bpm.  Poor anterior R wave progression.  No ectopy.  QTc interval 468 Laura.  October 2018 ECG (independently read by me): Normal sinus rhythm at 79 bpm..  No ST segment changes.  QTc interval 463 Laura.  June 2018 EKG:  EKG is ordered today. Sinus tachycardia 105 bpm.  QTc interval 460 Laura.  Q wave in lead 3.  PR interval 182 Laura.  March 2018 ECG (independently read by me): Normal sinus rhythm at 75 bpm.  Nonspecific ST changes.  Normal intervals.  Recent Labs: BMP Latest Ref Rng & Units 11/18/2017 11/06/2015 11/03/2015  Glucose 65 - 99 mg/dL 112(H) 97 87  BUN 6 - 20 mg/dL - 25(H) 20  Creatinine 0.44 - 1.00 mg/dL - 8.13(H) 7.41(H)  Sodium 135 - 145 mmol/L 138 135 136  Potassium 3.5 - 5.1 mmol/L 4.2 4.3 3.8  Chloride 101 - 111 mmol/L - 96(L) 97(L)  CO2 22 - 32 mmol/L - 30 28  Calcium 8.9 - 10.3 mg/dL - 8.2(L) 8.3(L)     Hepatic Function Latest Ref Rng & Units 11/06/2015 11/03/2015 10/30/2015  Total Protein 6.5 - 8.1 g/dL - - -  Albumin 3.5 - 5.0 g/dL 2.6(L) 2.6(L) 2.6(L)  AST 15 - 41 U/L - - -  ALT 14 - 54 U/L - - -  Alk Phosphatase 38 - 126 U/L - - -  Total Bilirubin 0.3 - 1.2 mg/dL - - -    CBC Latest Ref Rng & Units 11/18/2017 11/06/2015 11/03/2015  WBC 4.0 - 10.5 K/uL - 6.2 7.0  Hemoglobin 12.0 - 15.0 g/dL 14.3 10.0(L) 10.0(L)  Hematocrit 36 - 46 % 42.0 33.4(L) 32.7(L)  Platelets 150 - 400 K/uL - 245 199   Lab Results  Component Value Date   MCV 89.8 11/06/2015   MCV 88.9 11/03/2015   MCV 90.3 10/30/2015   Lab Results  Component Value Date   TSH 1.541 10/07/2015   Lab Results  Component Value Date   HGBA1C 6.2 (H) 10/07/2015     BNP    Component Value Date/Time   BNP 464.4 (H) 10/04/2015 2319    ProBNP No results found for: PROBNP   Lipid Panel  No results found for: CHOL, TRIG, HDL, CHOLHDL, VLDL,  LDLCALC, LDLDIRECT   RADIOLOGY: No results found.   Additional studies/ records that were reviewed today include:  I have reviewed the office notes from her prior providers.  I reviewed her hospitalization.  I reviewed laboratory.  I have reviewed her sleep  study from [facility] and the notes of Dr. Gareth Eagle  I generated a new download from Oct 07, 2017 through November 05, 2017 I obtained a new download from June 2 through December 01, 2018  ASSESSMENT:    1. OSA (obstructive sleep apnea)   2. Hyperlipidemia with target LDL less than 70   3. Type 2 diabetes mellitus with diabetic nephropathy, with long-term current use of insulin ([place])   4. ESRD on dialysis ([place])   5. Chronic diastolic CHF (congestive heart failure) ([place])   6. Morbid obesity, unspecified obesity type [facility])     PLAN:  [name] is a 64 year-old female who is a history of hypertension, hyperlipidemia, obstructive sleep apnea, super morbid obesity and end-stage renal disease.  She has been on dialysis since May 2017. Prior to dialysis he had issues with significant volume overload and acute on chronic diastolic heart failure.  Since being on dialysis, her volume status has stabilized.  She transiently had had issues with dialysis and her medications have been reduced.  Since I last saw her, her metoprolol succinate has further been reduced from 25 mg down to 12.5 mg every other day.  She believes she is sleeping well with CPAP therapy.  Any change in download today.  She has 100% daily use.  Sleep duration is increased from 5 hours and 11 minutes up to 6 hours and 5 minutes since last year.  She now goes to bed earlier and typically is in bed at 8 PM.  She gets up typically at 2 AM since it takes her a long time to get ready and ultimately needs to be at a bus at 4:45 AM to take her to dialysis.  I suggested that she sleep later on nondialysis days and not try to get up as early to try to improve sleep duration up to at  least 7 to possibly 8 hours if at all possible.  At 10 cm water pressure, AHI is improved to 2.6.  She has not had any mask leak.  Her blood pressure today is stable.  She is now on atorvastatin and most recent LDL cholesterol on March 13, 2020 was 70.  Laboratory by Dr. Loney Loh on March 13, 2020 revealed a creatinine of 7.  Hemoglobin A1c had increased to 8.4.  She is on Tradjenta and insulin.  We discussed the importance of weight loss.  She has been trying to be very safe to avoid Covid.  She believes Dr.: [name] has taken over her nephrology care but has not seen him in the office.  As long as she is stable I will see her in 1 year for reevaluation or sooner as needed.   Medication Adjustments/Labs and Tests Ordered: Current medicines are reviewed at length with the patient today.  Concerns regarding medicines are outlined above.  Medication changes, Labs and Tests ordered today are listed in the Patient Instructions below. Patient Instructions  Medication Instructions:  Your physician recommends that you continue on your current medications as directed. Please refer to the Current Medication list given to you today.  *If you need a refill on your cardiac medications before your next appointment, please call your pharmacy*   Follow-Up: At [facility], you and your health needs are our priority.  As part of our continuing mission to provide you with exceptional heart care, we have created designated Provider Care Teams.  These Care Teams include your primary Cardiologist (physician) and Advanced Practice Providers (APPs -  Physician Assistants and Nurse Practitioners) who all work together to provide you with the care you need, when you need it.  We recommend signing up for the patient portal called "MyChart".  Sign up information is provided on this After Visit Summary.  MyChart is used to connect with patients for Virtual Visits (Telemedicine).  Patients are able to view lab/test results,  encounter notes, upcoming appointments, etc.  Non-urgent messages can be sent to your provider as well.   To learn more about what you can do with MyChart, go to [email or web address].    Your next appointment:   12 month(s)  The format for your next appointment:   In Person  Provider:   Shelva Majestic, MD   Other Instructions     Signed, Shelva Majestic, MD, [facility] 03/27/2020 2:50 PM    [place] [address] [place], [place], [place]  [address] Phone: [phone]

## 2020-03-27 NOTE — Patient Instructions (Signed)
Medication Instructions:  Your physician recommends that you continue on your current medications as directed. Please refer to the Current Medication list given to you today.  *If you need a refill on your cardiac medications before your next appointment, please call your pharmacy*   Follow-Up: At [facility], you and your health needs are our priority.  As part of our continuing mission to provide you with exceptional heart care, we have created designated Provider Care Teams.  These Care Teams include your primary Cardiologist (physician) and Advanced Practice Providers (APPs -  Physician Assistants and Nurse Practitioners) who all work together to provide you with the care you need, when you need it.  We recommend signing up for the patient portal called "MyChart".  Sign up information is provided on this After Visit Summary.  MyChart is used to connect with patients for Virtual Visits (Telemedicine).  Patients are able to view lab/test results, encounter notes, upcoming appointments, etc.  Non-urgent messages can be sent to your provider as well.   To learn more about what you can do with MyChart, go to [email or web address].    Your next appointment:   12 month(s)  The format for your next appointment:   In Person  Provider:   Shelva Majestic, MD   Other Instructions

## 2020-03-28 ENCOUNTER — Other Ambulatory Visit: Payer: Self-pay | Admitting: Internal Medicine

## 2020-03-28 DIAGNOSIS — D631 Anemia in chronic kidney disease: Secondary | ICD-10-CM | POA: Diagnosis not present

## 2020-03-28 DIAGNOSIS — N2581 Secondary hyperparathyroidism of renal origin: Secondary | ICD-10-CM | POA: Diagnosis not present

## 2020-03-28 DIAGNOSIS — N186 End stage renal disease: Secondary | ICD-10-CM | POA: Diagnosis not present

## 2020-03-28 DIAGNOSIS — Z992 Dependence on renal dialysis: Secondary | ICD-10-CM | POA: Diagnosis not present

## 2020-03-28 DIAGNOSIS — N6320 Unspecified lump in the left breast, unspecified quadrant: Secondary | ICD-10-CM

## 2020-03-28 DIAGNOSIS — D689 Coagulation defect, unspecified: Secondary | ICD-10-CM | POA: Diagnosis not present

## 2020-03-30 DIAGNOSIS — D631 Anemia in chronic kidney disease: Secondary | ICD-10-CM | POA: Diagnosis not present

## 2020-03-30 DIAGNOSIS — N186 End stage renal disease: Secondary | ICD-10-CM | POA: Diagnosis not present

## 2020-03-30 DIAGNOSIS — N2581 Secondary hyperparathyroidism of renal origin: Secondary | ICD-10-CM | POA: Diagnosis not present

## 2020-03-30 DIAGNOSIS — D689 Coagulation defect, unspecified: Secondary | ICD-10-CM | POA: Diagnosis not present

## 2020-03-30 DIAGNOSIS — Z992 Dependence on renal dialysis: Secondary | ICD-10-CM | POA: Diagnosis not present

## 2020-04-01 DIAGNOSIS — Z992 Dependence on renal dialysis: Secondary | ICD-10-CM | POA: Diagnosis not present

## 2020-04-01 DIAGNOSIS — N186 End stage renal disease: Secondary | ICD-10-CM | POA: Diagnosis not present

## 2020-04-01 DIAGNOSIS — I129 Hypertensive chronic kidney disease with stage 1 through stage 4 chronic kidney disease, or unspecified chronic kidney disease: Secondary | ICD-10-CM | POA: Diagnosis not present

## 2020-04-02 DIAGNOSIS — N2581 Secondary hyperparathyroidism of renal origin: Secondary | ICD-10-CM | POA: Diagnosis not present

## 2020-04-02 DIAGNOSIS — D631 Anemia in chronic kidney disease: Secondary | ICD-10-CM | POA: Diagnosis not present

## 2020-04-02 DIAGNOSIS — N186 End stage renal disease: Secondary | ICD-10-CM | POA: Diagnosis not present

## 2020-04-02 DIAGNOSIS — Z992 Dependence on renal dialysis: Secondary | ICD-10-CM | POA: Diagnosis not present

## 2020-04-02 DIAGNOSIS — D689 Coagulation defect, unspecified: Secondary | ICD-10-CM | POA: Diagnosis not present

## 2020-04-03 DIAGNOSIS — Z794 Long term (current) use of insulin: Secondary | ICD-10-CM | POA: Diagnosis not present

## 2020-04-03 DIAGNOSIS — E1122 Type 2 diabetes mellitus with diabetic chronic kidney disease: Secondary | ICD-10-CM | POA: Diagnosis not present

## 2020-04-04 DIAGNOSIS — D631 Anemia in chronic kidney disease: Secondary | ICD-10-CM | POA: Diagnosis not present

## 2020-04-04 DIAGNOSIS — Z992 Dependence on renal dialysis: Secondary | ICD-10-CM | POA: Diagnosis not present

## 2020-04-04 DIAGNOSIS — N2581 Secondary hyperparathyroidism of renal origin: Secondary | ICD-10-CM | POA: Diagnosis not present

## 2020-04-04 DIAGNOSIS — N186 End stage renal disease: Secondary | ICD-10-CM | POA: Diagnosis not present

## 2020-04-04 DIAGNOSIS — D689 Coagulation defect, unspecified: Secondary | ICD-10-CM | POA: Diagnosis not present

## 2020-04-06 DIAGNOSIS — D631 Anemia in chronic kidney disease: Secondary | ICD-10-CM | POA: Diagnosis not present

## 2020-04-06 DIAGNOSIS — D689 Coagulation defect, unspecified: Secondary | ICD-10-CM | POA: Diagnosis not present

## 2020-04-06 DIAGNOSIS — N186 End stage renal disease: Secondary | ICD-10-CM | POA: Diagnosis not present

## 2020-04-06 DIAGNOSIS — N2581 Secondary hyperparathyroidism of renal origin: Secondary | ICD-10-CM | POA: Diagnosis not present

## 2020-04-06 DIAGNOSIS — Z992 Dependence on renal dialysis: Secondary | ICD-10-CM | POA: Diagnosis not present

## 2020-04-09 DIAGNOSIS — N2581 Secondary hyperparathyroidism of renal origin: Secondary | ICD-10-CM | POA: Diagnosis not present

## 2020-04-09 DIAGNOSIS — D631 Anemia in chronic kidney disease: Secondary | ICD-10-CM | POA: Diagnosis not present

## 2020-04-09 DIAGNOSIS — Z992 Dependence on renal dialysis: Secondary | ICD-10-CM | POA: Diagnosis not present

## 2020-04-09 DIAGNOSIS — N186 End stage renal disease: Secondary | ICD-10-CM | POA: Diagnosis not present

## 2020-04-09 DIAGNOSIS — D689 Coagulation defect, unspecified: Secondary | ICD-10-CM | POA: Diagnosis not present

## 2020-04-11 DIAGNOSIS — N186 End stage renal disease: Secondary | ICD-10-CM | POA: Diagnosis not present

## 2020-04-11 DIAGNOSIS — Z992 Dependence on renal dialysis: Secondary | ICD-10-CM | POA: Diagnosis not present

## 2020-04-11 DIAGNOSIS — D689 Coagulation defect, unspecified: Secondary | ICD-10-CM | POA: Diagnosis not present

## 2020-04-11 DIAGNOSIS — D631 Anemia in chronic kidney disease: Secondary | ICD-10-CM | POA: Diagnosis not present

## 2020-04-11 DIAGNOSIS — N2581 Secondary hyperparathyroidism of renal origin: Secondary | ICD-10-CM | POA: Diagnosis not present

## 2020-04-12 DIAGNOSIS — I871 Compression of vein: Secondary | ICD-10-CM | POA: Diagnosis not present

## 2020-04-12 DIAGNOSIS — Z992 Dependence on renal dialysis: Secondary | ICD-10-CM | POA: Diagnosis not present

## 2020-04-12 DIAGNOSIS — N186 End stage renal disease: Secondary | ICD-10-CM | POA: Diagnosis not present

## 2020-04-13 DIAGNOSIS — Z992 Dependence on renal dialysis: Secondary | ICD-10-CM | POA: Diagnosis not present

## 2020-04-13 DIAGNOSIS — D689 Coagulation defect, unspecified: Secondary | ICD-10-CM | POA: Diagnosis not present

## 2020-04-13 DIAGNOSIS — N2581 Secondary hyperparathyroidism of renal origin: Secondary | ICD-10-CM | POA: Diagnosis not present

## 2020-04-13 DIAGNOSIS — N186 End stage renal disease: Secondary | ICD-10-CM | POA: Diagnosis not present

## 2020-04-13 DIAGNOSIS — D631 Anemia in chronic kidney disease: Secondary | ICD-10-CM | POA: Diagnosis not present

## 2020-04-16 DIAGNOSIS — D631 Anemia in chronic kidney disease: Secondary | ICD-10-CM | POA: Diagnosis not present

## 2020-04-16 DIAGNOSIS — N2581 Secondary hyperparathyroidism of renal origin: Secondary | ICD-10-CM | POA: Diagnosis not present

## 2020-04-16 DIAGNOSIS — D689 Coagulation defect, unspecified: Secondary | ICD-10-CM | POA: Diagnosis not present

## 2020-04-16 DIAGNOSIS — N186 End stage renal disease: Secondary | ICD-10-CM | POA: Diagnosis not present

## 2020-04-16 DIAGNOSIS — Z992 Dependence on renal dialysis: Secondary | ICD-10-CM | POA: Diagnosis not present

## 2020-04-18 DIAGNOSIS — N2581 Secondary hyperparathyroidism of renal origin: Secondary | ICD-10-CM | POA: Diagnosis not present

## 2020-04-18 DIAGNOSIS — Z992 Dependence on renal dialysis: Secondary | ICD-10-CM | POA: Diagnosis not present

## 2020-04-18 DIAGNOSIS — D689 Coagulation defect, unspecified: Secondary | ICD-10-CM | POA: Diagnosis not present

## 2020-04-18 DIAGNOSIS — N186 End stage renal disease: Secondary | ICD-10-CM | POA: Diagnosis not present

## 2020-04-18 DIAGNOSIS — D631 Anemia in chronic kidney disease: Secondary | ICD-10-CM | POA: Diagnosis not present

## 2020-04-20 DIAGNOSIS — D631 Anemia in chronic kidney disease: Secondary | ICD-10-CM | POA: Diagnosis not present

## 2020-04-20 DIAGNOSIS — N2581 Secondary hyperparathyroidism of renal origin: Secondary | ICD-10-CM | POA: Diagnosis not present

## 2020-04-20 DIAGNOSIS — D689 Coagulation defect, unspecified: Secondary | ICD-10-CM | POA: Diagnosis not present

## 2020-04-20 DIAGNOSIS — Z992 Dependence on renal dialysis: Secondary | ICD-10-CM | POA: Diagnosis not present

## 2020-04-20 DIAGNOSIS — N186 End stage renal disease: Secondary | ICD-10-CM | POA: Diagnosis not present

## 2020-04-22 DIAGNOSIS — D689 Coagulation defect, unspecified: Secondary | ICD-10-CM | POA: Diagnosis not present

## 2020-04-22 DIAGNOSIS — Z992 Dependence on renal dialysis: Secondary | ICD-10-CM | POA: Diagnosis not present

## 2020-04-22 DIAGNOSIS — N2581 Secondary hyperparathyroidism of renal origin: Secondary | ICD-10-CM | POA: Diagnosis not present

## 2020-04-22 DIAGNOSIS — D631 Anemia in chronic kidney disease: Secondary | ICD-10-CM | POA: Diagnosis not present

## 2020-04-22 DIAGNOSIS — N186 End stage renal disease: Secondary | ICD-10-CM | POA: Diagnosis not present

## 2020-04-24 ENCOUNTER — Other Ambulatory Visit: Payer: Medicare Other

## 2020-04-24 DIAGNOSIS — D689 Coagulation defect, unspecified: Secondary | ICD-10-CM | POA: Diagnosis not present

## 2020-04-24 DIAGNOSIS — Z992 Dependence on renal dialysis: Secondary | ICD-10-CM | POA: Diagnosis not present

## 2020-04-24 DIAGNOSIS — D631 Anemia in chronic kidney disease: Secondary | ICD-10-CM | POA: Diagnosis not present

## 2020-04-24 DIAGNOSIS — N2581 Secondary hyperparathyroidism of renal origin: Secondary | ICD-10-CM | POA: Diagnosis not present

## 2020-04-24 DIAGNOSIS — N186 End stage renal disease: Secondary | ICD-10-CM | POA: Diagnosis not present

## 2020-04-27 DIAGNOSIS — Z992 Dependence on renal dialysis: Secondary | ICD-10-CM | POA: Diagnosis not present

## 2020-04-27 DIAGNOSIS — N186 End stage renal disease: Secondary | ICD-10-CM | POA: Diagnosis not present

## 2020-04-27 DIAGNOSIS — N2581 Secondary hyperparathyroidism of renal origin: Secondary | ICD-10-CM | POA: Diagnosis not present

## 2020-04-27 DIAGNOSIS — D689 Coagulation defect, unspecified: Secondary | ICD-10-CM | POA: Diagnosis not present

## 2020-04-27 DIAGNOSIS — D631 Anemia in chronic kidney disease: Secondary | ICD-10-CM | POA: Diagnosis not present

## 2020-04-30 DIAGNOSIS — N186 End stage renal disease: Secondary | ICD-10-CM | POA: Diagnosis not present

## 2020-04-30 DIAGNOSIS — N2581 Secondary hyperparathyroidism of renal origin: Secondary | ICD-10-CM | POA: Diagnosis not present

## 2020-04-30 DIAGNOSIS — Z992 Dependence on renal dialysis: Secondary | ICD-10-CM | POA: Diagnosis not present

## 2020-04-30 DIAGNOSIS — D631 Anemia in chronic kidney disease: Secondary | ICD-10-CM | POA: Diagnosis not present

## 2020-04-30 DIAGNOSIS — D689 Coagulation defect, unspecified: Secondary | ICD-10-CM | POA: Diagnosis not present

## 2020-05-01 ENCOUNTER — Other Ambulatory Visit: Payer: Self-pay

## 2020-05-01 ENCOUNTER — Ambulatory Visit
Admission: RE | Admit: 2020-05-01 | Discharge: 2020-05-01 | Disposition: A | Discharge status: Home / Self Care | Payer: Medicare Other | Source: Ambulatory Visit | Attending: Internal Medicine | Admitting: Internal Medicine

## 2020-05-01 ENCOUNTER — Ambulatory Visit: Payer: Medicare Other

## 2020-05-01 DIAGNOSIS — N6320 Unspecified lump in the left breast, unspecified quadrant: Secondary | ICD-10-CM

## 2020-05-01 DIAGNOSIS — Z992 Dependence on renal dialysis: Secondary | ICD-10-CM | POA: Diagnosis not present

## 2020-05-01 DIAGNOSIS — R928 Other abnormal and inconclusive findings on diagnostic imaging of breast: Secondary | ICD-10-CM | POA: Diagnosis not present

## 2020-05-01 DIAGNOSIS — I129 Hypertensive chronic kidney disease with stage 1 through stage 4 chronic kidney disease, or unspecified chronic kidney disease: Secondary | ICD-10-CM | POA: Diagnosis not present

## 2020-05-01 DIAGNOSIS — N186 End stage renal disease: Secondary | ICD-10-CM | POA: Diagnosis not present

## 2020-05-02 DIAGNOSIS — N2581 Secondary hyperparathyroidism of renal origin: Secondary | ICD-10-CM | POA: Diagnosis not present

## 2020-05-02 DIAGNOSIS — D689 Coagulation defect, unspecified: Secondary | ICD-10-CM | POA: Diagnosis not present

## 2020-05-02 DIAGNOSIS — Z992 Dependence on renal dialysis: Secondary | ICD-10-CM | POA: Diagnosis not present

## 2020-05-02 DIAGNOSIS — N186 End stage renal disease: Secondary | ICD-10-CM | POA: Diagnosis not present

## 2020-05-02 DIAGNOSIS — D631 Anemia in chronic kidney disease: Secondary | ICD-10-CM | POA: Diagnosis not present

## 2020-05-04 DIAGNOSIS — D631 Anemia in chronic kidney disease: Secondary | ICD-10-CM | POA: Diagnosis not present

## 2020-05-04 DIAGNOSIS — N2581 Secondary hyperparathyroidism of renal origin: Secondary | ICD-10-CM | POA: Diagnosis not present

## 2020-05-04 DIAGNOSIS — Z992 Dependence on renal dialysis: Secondary | ICD-10-CM | POA: Diagnosis not present

## 2020-05-04 DIAGNOSIS — N186 End stage renal disease: Secondary | ICD-10-CM | POA: Diagnosis not present

## 2020-05-04 DIAGNOSIS — D689 Coagulation defect, unspecified: Secondary | ICD-10-CM | POA: Diagnosis not present

## 2020-05-07 DIAGNOSIS — D631 Anemia in chronic kidney disease: Secondary | ICD-10-CM | POA: Diagnosis not present

## 2020-05-07 DIAGNOSIS — Z992 Dependence on renal dialysis: Secondary | ICD-10-CM | POA: Diagnosis not present

## 2020-05-07 DIAGNOSIS — D689 Coagulation defect, unspecified: Secondary | ICD-10-CM | POA: Diagnosis not present

## 2020-05-07 DIAGNOSIS — N186 End stage renal disease: Secondary | ICD-10-CM | POA: Diagnosis not present

## 2020-05-07 DIAGNOSIS — N2581 Secondary hyperparathyroidism of renal origin: Secondary | ICD-10-CM | POA: Diagnosis not present

## 2020-05-08 DIAGNOSIS — E1122 Type 2 diabetes mellitus with diabetic chronic kidney disease: Secondary | ICD-10-CM | POA: Diagnosis not present

## 2020-05-08 DIAGNOSIS — Z794 Long term (current) use of insulin: Secondary | ICD-10-CM | POA: Diagnosis not present

## 2020-05-09 DIAGNOSIS — D631 Anemia in chronic kidney disease: Secondary | ICD-10-CM | POA: Diagnosis not present

## 2020-05-09 DIAGNOSIS — D689 Coagulation defect, unspecified: Secondary | ICD-10-CM | POA: Diagnosis not present

## 2020-05-09 DIAGNOSIS — N186 End stage renal disease: Secondary | ICD-10-CM | POA: Diagnosis not present

## 2020-05-09 DIAGNOSIS — N2581 Secondary hyperparathyroidism of renal origin: Secondary | ICD-10-CM | POA: Diagnosis not present

## 2020-05-09 DIAGNOSIS — Z992 Dependence on renal dialysis: Secondary | ICD-10-CM | POA: Diagnosis not present

## 2020-05-11 DIAGNOSIS — N186 End stage renal disease: Secondary | ICD-10-CM | POA: Diagnosis not present

## 2020-05-11 DIAGNOSIS — D689 Coagulation defect, unspecified: Secondary | ICD-10-CM | POA: Diagnosis not present

## 2020-05-11 DIAGNOSIS — N2581 Secondary hyperparathyroidism of renal origin: Secondary | ICD-10-CM | POA: Diagnosis not present

## 2020-05-11 DIAGNOSIS — D631 Anemia in chronic kidney disease: Secondary | ICD-10-CM | POA: Diagnosis not present

## 2020-05-11 DIAGNOSIS — Z992 Dependence on renal dialysis: Secondary | ICD-10-CM | POA: Diagnosis not present

## 2020-05-14 DIAGNOSIS — D689 Coagulation defect, unspecified: Secondary | ICD-10-CM | POA: Diagnosis not present

## 2020-05-14 DIAGNOSIS — N2581 Secondary hyperparathyroidism of renal origin: Secondary | ICD-10-CM | POA: Diagnosis not present

## 2020-05-14 DIAGNOSIS — D631 Anemia in chronic kidney disease: Secondary | ICD-10-CM | POA: Diagnosis not present

## 2020-05-14 DIAGNOSIS — N186 End stage renal disease: Secondary | ICD-10-CM | POA: Diagnosis not present

## 2020-05-14 DIAGNOSIS — Z992 Dependence on renal dialysis: Secondary | ICD-10-CM | POA: Diagnosis not present

## 2020-05-16 DIAGNOSIS — Z992 Dependence on renal dialysis: Secondary | ICD-10-CM | POA: Diagnosis not present

## 2020-05-16 DIAGNOSIS — D689 Coagulation defect, unspecified: Secondary | ICD-10-CM | POA: Diagnosis not present

## 2020-05-16 DIAGNOSIS — N2581 Secondary hyperparathyroidism of renal origin: Secondary | ICD-10-CM | POA: Diagnosis not present

## 2020-05-16 DIAGNOSIS — N186 End stage renal disease: Secondary | ICD-10-CM | POA: Diagnosis not present

## 2020-05-16 DIAGNOSIS — D631 Anemia in chronic kidney disease: Secondary | ICD-10-CM | POA: Diagnosis not present

## 2020-05-18 DIAGNOSIS — Z992 Dependence on renal dialysis: Secondary | ICD-10-CM | POA: Diagnosis not present

## 2020-05-18 DIAGNOSIS — N2581 Secondary hyperparathyroidism of renal origin: Secondary | ICD-10-CM | POA: Diagnosis not present

## 2020-05-18 DIAGNOSIS — N186 End stage renal disease: Secondary | ICD-10-CM | POA: Diagnosis not present

## 2020-05-18 DIAGNOSIS — D689 Coagulation defect, unspecified: Secondary | ICD-10-CM | POA: Diagnosis not present

## 2020-05-18 DIAGNOSIS — D631 Anemia in chronic kidney disease: Secondary | ICD-10-CM | POA: Diagnosis not present

## 2020-05-21 DIAGNOSIS — D689 Coagulation defect, unspecified: Secondary | ICD-10-CM | POA: Diagnosis not present

## 2020-05-21 DIAGNOSIS — N2581 Secondary hyperparathyroidism of renal origin: Secondary | ICD-10-CM | POA: Diagnosis not present

## 2020-05-21 DIAGNOSIS — N186 End stage renal disease: Secondary | ICD-10-CM | POA: Diagnosis not present

## 2020-05-21 DIAGNOSIS — D631 Anemia in chronic kidney disease: Secondary | ICD-10-CM | POA: Diagnosis not present

## 2020-05-21 DIAGNOSIS — Z992 Dependence on renal dialysis: Secondary | ICD-10-CM | POA: Diagnosis not present

## 2020-05-23 DIAGNOSIS — D689 Coagulation defect, unspecified: Secondary | ICD-10-CM | POA: Diagnosis not present

## 2020-05-23 DIAGNOSIS — Z992 Dependence on renal dialysis: Secondary | ICD-10-CM | POA: Diagnosis not present

## 2020-05-23 DIAGNOSIS — D631 Anemia in chronic kidney disease: Secondary | ICD-10-CM | POA: Diagnosis not present

## 2020-05-23 DIAGNOSIS — N2581 Secondary hyperparathyroidism of renal origin: Secondary | ICD-10-CM | POA: Diagnosis not present

## 2020-05-23 DIAGNOSIS — N186 End stage renal disease: Secondary | ICD-10-CM | POA: Diagnosis not present

## 2020-05-25 DIAGNOSIS — D631 Anemia in chronic kidney disease: Secondary | ICD-10-CM | POA: Diagnosis not present

## 2020-05-25 DIAGNOSIS — D689 Coagulation defect, unspecified: Secondary | ICD-10-CM | POA: Diagnosis not present

## 2020-05-25 DIAGNOSIS — N186 End stage renal disease: Secondary | ICD-10-CM | POA: Diagnosis not present

## 2020-05-25 DIAGNOSIS — Z992 Dependence on renal dialysis: Secondary | ICD-10-CM | POA: Diagnosis not present

## 2020-05-25 DIAGNOSIS — N2581 Secondary hyperparathyroidism of renal origin: Secondary | ICD-10-CM | POA: Diagnosis not present

## 2020-05-28 DIAGNOSIS — D631 Anemia in chronic kidney disease: Secondary | ICD-10-CM | POA: Diagnosis not present

## 2020-05-28 DIAGNOSIS — N186 End stage renal disease: Secondary | ICD-10-CM | POA: Diagnosis not present

## 2020-05-28 DIAGNOSIS — Z992 Dependence on renal dialysis: Secondary | ICD-10-CM | POA: Diagnosis not present

## 2020-05-28 DIAGNOSIS — N2581 Secondary hyperparathyroidism of renal origin: Secondary | ICD-10-CM | POA: Diagnosis not present

## 2020-05-28 DIAGNOSIS — D689 Coagulation defect, unspecified: Secondary | ICD-10-CM | POA: Diagnosis not present

## 2020-05-30 DIAGNOSIS — D631 Anemia in chronic kidney disease: Secondary | ICD-10-CM | POA: Diagnosis not present

## 2020-05-30 DIAGNOSIS — N2581 Secondary hyperparathyroidism of renal origin: Secondary | ICD-10-CM | POA: Diagnosis not present

## 2020-05-30 DIAGNOSIS — Z992 Dependence on renal dialysis: Secondary | ICD-10-CM | POA: Diagnosis not present

## 2020-05-30 DIAGNOSIS — N186 End stage renal disease: Secondary | ICD-10-CM | POA: Diagnosis not present

## 2020-05-30 DIAGNOSIS — D689 Coagulation defect, unspecified: Secondary | ICD-10-CM | POA: Diagnosis not present

## 2020-05-31 DIAGNOSIS — E1122 Type 2 diabetes mellitus with diabetic chronic kidney disease: Secondary | ICD-10-CM | POA: Diagnosis not present

## 2020-05-31 DIAGNOSIS — Z794 Long term (current) use of insulin: Secondary | ICD-10-CM | POA: Diagnosis not present

## 2020-06-01 DIAGNOSIS — N186 End stage renal disease: Secondary | ICD-10-CM | POA: Diagnosis not present

## 2020-06-01 DIAGNOSIS — D631 Anemia in chronic kidney disease: Secondary | ICD-10-CM | POA: Diagnosis not present

## 2020-06-01 DIAGNOSIS — I129 Hypertensive chronic kidney disease with stage 1 through stage 4 chronic kidney disease, or unspecified chronic kidney disease: Secondary | ICD-10-CM | POA: Diagnosis not present

## 2020-06-01 DIAGNOSIS — D689 Coagulation defect, unspecified: Secondary | ICD-10-CM | POA: Diagnosis not present

## 2020-06-01 DIAGNOSIS — Z992 Dependence on renal dialysis: Secondary | ICD-10-CM | POA: Diagnosis not present

## 2020-06-01 DIAGNOSIS — N2581 Secondary hyperparathyroidism of renal origin: Secondary | ICD-10-CM | POA: Diagnosis not present

## 2020-06-04 DIAGNOSIS — N186 End stage renal disease: Secondary | ICD-10-CM | POA: Diagnosis not present

## 2020-06-04 DIAGNOSIS — N2581 Secondary hyperparathyroidism of renal origin: Secondary | ICD-10-CM | POA: Diagnosis not present

## 2020-06-04 DIAGNOSIS — D631 Anemia in chronic kidney disease: Secondary | ICD-10-CM | POA: Diagnosis not present

## 2020-06-04 DIAGNOSIS — D689 Coagulation defect, unspecified: Secondary | ICD-10-CM | POA: Diagnosis not present

## 2020-06-04 DIAGNOSIS — Z992 Dependence on renal dialysis: Secondary | ICD-10-CM | POA: Diagnosis not present

## 2020-06-06 DIAGNOSIS — D631 Anemia in chronic kidney disease: Secondary | ICD-10-CM | POA: Diagnosis not present

## 2020-06-06 DIAGNOSIS — N186 End stage renal disease: Secondary | ICD-10-CM | POA: Diagnosis not present

## 2020-06-06 DIAGNOSIS — D689 Coagulation defect, unspecified: Secondary | ICD-10-CM | POA: Diagnosis not present

## 2020-06-06 DIAGNOSIS — N2581 Secondary hyperparathyroidism of renal origin: Secondary | ICD-10-CM | POA: Diagnosis not present

## 2020-06-06 DIAGNOSIS — Z992 Dependence on renal dialysis: Secondary | ICD-10-CM | POA: Diagnosis not present

## 2020-06-08 DIAGNOSIS — N2581 Secondary hyperparathyroidism of renal origin: Secondary | ICD-10-CM | POA: Diagnosis not present

## 2020-06-08 DIAGNOSIS — D631 Anemia in chronic kidney disease: Secondary | ICD-10-CM | POA: Diagnosis not present

## 2020-06-08 DIAGNOSIS — N186 End stage renal disease: Secondary | ICD-10-CM | POA: Diagnosis not present

## 2020-06-08 DIAGNOSIS — D689 Coagulation defect, unspecified: Secondary | ICD-10-CM | POA: Diagnosis not present

## 2020-06-08 DIAGNOSIS — Z992 Dependence on renal dialysis: Secondary | ICD-10-CM | POA: Diagnosis not present

## 2020-06-11 DIAGNOSIS — N186 End stage renal disease: Secondary | ICD-10-CM | POA: Diagnosis not present

## 2020-06-11 DIAGNOSIS — N2581 Secondary hyperparathyroidism of renal origin: Secondary | ICD-10-CM | POA: Diagnosis not present

## 2020-06-11 DIAGNOSIS — D631 Anemia in chronic kidney disease: Secondary | ICD-10-CM | POA: Diagnosis not present

## 2020-06-11 DIAGNOSIS — Z992 Dependence on renal dialysis: Secondary | ICD-10-CM | POA: Diagnosis not present

## 2020-06-11 DIAGNOSIS — D689 Coagulation defect, unspecified: Secondary | ICD-10-CM | POA: Diagnosis not present

## 2020-06-13 DIAGNOSIS — N186 End stage renal disease: Secondary | ICD-10-CM | POA: Diagnosis not present

## 2020-06-13 DIAGNOSIS — N2581 Secondary hyperparathyroidism of renal origin: Secondary | ICD-10-CM | POA: Diagnosis not present

## 2020-06-13 DIAGNOSIS — D631 Anemia in chronic kidney disease: Secondary | ICD-10-CM | POA: Diagnosis not present

## 2020-06-13 DIAGNOSIS — D689 Coagulation defect, unspecified: Secondary | ICD-10-CM | POA: Diagnosis not present

## 2020-06-13 DIAGNOSIS — Z992 Dependence on renal dialysis: Secondary | ICD-10-CM | POA: Diagnosis not present

## 2020-06-15 DIAGNOSIS — D689 Coagulation defect, unspecified: Secondary | ICD-10-CM | POA: Diagnosis not present

## 2020-06-15 DIAGNOSIS — Z992 Dependence on renal dialysis: Secondary | ICD-10-CM | POA: Diagnosis not present

## 2020-06-15 DIAGNOSIS — N2581 Secondary hyperparathyroidism of renal origin: Secondary | ICD-10-CM | POA: Diagnosis not present

## 2020-06-15 DIAGNOSIS — D631 Anemia in chronic kidney disease: Secondary | ICD-10-CM | POA: Diagnosis not present

## 2020-06-15 DIAGNOSIS — N186 End stage renal disease: Secondary | ICD-10-CM | POA: Diagnosis not present

## 2020-06-20 DIAGNOSIS — N186 End stage renal disease: Secondary | ICD-10-CM | POA: Diagnosis not present

## 2020-06-20 DIAGNOSIS — Z992 Dependence on renal dialysis: Secondary | ICD-10-CM | POA: Diagnosis not present

## 2020-06-20 DIAGNOSIS — N2581 Secondary hyperparathyroidism of renal origin: Secondary | ICD-10-CM | POA: Diagnosis not present

## 2020-06-20 DIAGNOSIS — D689 Coagulation defect, unspecified: Secondary | ICD-10-CM | POA: Diagnosis not present

## 2020-06-20 DIAGNOSIS — D631 Anemia in chronic kidney disease: Secondary | ICD-10-CM | POA: Diagnosis not present

## 2020-06-22 DIAGNOSIS — N2581 Secondary hyperparathyroidism of renal origin: Secondary | ICD-10-CM | POA: Diagnosis not present

## 2020-06-22 DIAGNOSIS — N186 End stage renal disease: Secondary | ICD-10-CM | POA: Diagnosis not present

## 2020-06-22 DIAGNOSIS — D689 Coagulation defect, unspecified: Secondary | ICD-10-CM | POA: Diagnosis not present

## 2020-06-22 DIAGNOSIS — Z992 Dependence on renal dialysis: Secondary | ICD-10-CM | POA: Diagnosis not present

## 2020-06-22 DIAGNOSIS — D631 Anemia in chronic kidney disease: Secondary | ICD-10-CM | POA: Diagnosis not present

## 2020-06-25 DIAGNOSIS — N186 End stage renal disease: Secondary | ICD-10-CM | POA: Diagnosis not present

## 2020-06-25 DIAGNOSIS — N2581 Secondary hyperparathyroidism of renal origin: Secondary | ICD-10-CM | POA: Diagnosis not present

## 2020-06-25 DIAGNOSIS — Z992 Dependence on renal dialysis: Secondary | ICD-10-CM | POA: Diagnosis not present

## 2020-06-25 DIAGNOSIS — D631 Anemia in chronic kidney disease: Secondary | ICD-10-CM | POA: Diagnosis not present

## 2020-06-25 DIAGNOSIS — D689 Coagulation defect, unspecified: Secondary | ICD-10-CM | POA: Diagnosis not present

## 2020-06-27 DIAGNOSIS — Z992 Dependence on renal dialysis: Secondary | ICD-10-CM | POA: Diagnosis not present

## 2020-06-27 DIAGNOSIS — N2581 Secondary hyperparathyroidism of renal origin: Secondary | ICD-10-CM | POA: Diagnosis not present

## 2020-06-27 DIAGNOSIS — N186 End stage renal disease: Secondary | ICD-10-CM | POA: Diagnosis not present

## 2020-06-27 DIAGNOSIS — D631 Anemia in chronic kidney disease: Secondary | ICD-10-CM | POA: Diagnosis not present

## 2020-06-27 DIAGNOSIS — D689 Coagulation defect, unspecified: Secondary | ICD-10-CM | POA: Diagnosis not present

## 2020-06-29 DIAGNOSIS — N186 End stage renal disease: Secondary | ICD-10-CM | POA: Diagnosis not present

## 2020-06-29 DIAGNOSIS — D631 Anemia in chronic kidney disease: Secondary | ICD-10-CM | POA: Diagnosis not present

## 2020-06-29 DIAGNOSIS — D689 Coagulation defect, unspecified: Secondary | ICD-10-CM | POA: Diagnosis not present

## 2020-06-29 DIAGNOSIS — Z992 Dependence on renal dialysis: Secondary | ICD-10-CM | POA: Diagnosis not present

## 2020-06-29 DIAGNOSIS — N2581 Secondary hyperparathyroidism of renal origin: Secondary | ICD-10-CM | POA: Diagnosis not present

## 2020-07-02 DIAGNOSIS — Z992 Dependence on renal dialysis: Secondary | ICD-10-CM | POA: Diagnosis not present

## 2020-07-02 DIAGNOSIS — I129 Hypertensive chronic kidney disease with stage 1 through stage 4 chronic kidney disease, or unspecified chronic kidney disease: Secondary | ICD-10-CM | POA: Diagnosis not present

## 2020-07-02 DIAGNOSIS — D689 Coagulation defect, unspecified: Secondary | ICD-10-CM | POA: Diagnosis not present

## 2020-07-02 DIAGNOSIS — N186 End stage renal disease: Secondary | ICD-10-CM | POA: Diagnosis not present

## 2020-07-02 DIAGNOSIS — N2581 Secondary hyperparathyroidism of renal origin: Secondary | ICD-10-CM | POA: Diagnosis not present

## 2020-07-02 DIAGNOSIS — D631 Anemia in chronic kidney disease: Secondary | ICD-10-CM | POA: Diagnosis not present

## 2020-07-04 DIAGNOSIS — N2581 Secondary hyperparathyroidism of renal origin: Secondary | ICD-10-CM | POA: Diagnosis not present

## 2020-07-04 DIAGNOSIS — D631 Anemia in chronic kidney disease: Secondary | ICD-10-CM | POA: Diagnosis not present

## 2020-07-04 DIAGNOSIS — D689 Coagulation defect, unspecified: Secondary | ICD-10-CM | POA: Diagnosis not present

## 2020-07-04 DIAGNOSIS — N186 End stage renal disease: Secondary | ICD-10-CM | POA: Diagnosis not present

## 2020-07-04 DIAGNOSIS — Z992 Dependence on renal dialysis: Secondary | ICD-10-CM | POA: Diagnosis not present

## 2020-07-05 DIAGNOSIS — E1122 Type 2 diabetes mellitus with diabetic chronic kidney disease: Secondary | ICD-10-CM | POA: Diagnosis not present

## 2020-07-05 DIAGNOSIS — E789 Disorder of lipoprotein metabolism, unspecified: Secondary | ICD-10-CM | POA: Diagnosis not present

## 2020-07-05 DIAGNOSIS — N186 End stage renal disease: Secondary | ICD-10-CM | POA: Diagnosis not present

## 2020-07-05 DIAGNOSIS — Z794 Long term (current) use of insulin: Secondary | ICD-10-CM | POA: Diagnosis not present

## 2020-07-05 DIAGNOSIS — I10 Essential (primary) hypertension: Secondary | ICD-10-CM | POA: Diagnosis not present

## 2020-07-06 DIAGNOSIS — Z992 Dependence on renal dialysis: Secondary | ICD-10-CM | POA: Diagnosis not present

## 2020-07-06 DIAGNOSIS — N186 End stage renal disease: Secondary | ICD-10-CM | POA: Diagnosis not present

## 2020-07-06 DIAGNOSIS — N2581 Secondary hyperparathyroidism of renal origin: Secondary | ICD-10-CM | POA: Diagnosis not present

## 2020-07-06 DIAGNOSIS — D689 Coagulation defect, unspecified: Secondary | ICD-10-CM | POA: Diagnosis not present

## 2020-07-06 DIAGNOSIS — D631 Anemia in chronic kidney disease: Secondary | ICD-10-CM | POA: Diagnosis not present

## 2020-07-09 DIAGNOSIS — N186 End stage renal disease: Secondary | ICD-10-CM | POA: Diagnosis not present

## 2020-07-09 DIAGNOSIS — D631 Anemia in chronic kidney disease: Secondary | ICD-10-CM | POA: Diagnosis not present

## 2020-07-09 DIAGNOSIS — N2581 Secondary hyperparathyroidism of renal origin: Secondary | ICD-10-CM | POA: Diagnosis not present

## 2020-07-09 DIAGNOSIS — D689 Coagulation defect, unspecified: Secondary | ICD-10-CM | POA: Diagnosis not present

## 2020-07-09 DIAGNOSIS — Z992 Dependence on renal dialysis: Secondary | ICD-10-CM | POA: Diagnosis not present

## 2020-07-11 DIAGNOSIS — N186 End stage renal disease: Secondary | ICD-10-CM | POA: Diagnosis not present

## 2020-07-11 DIAGNOSIS — D689 Coagulation defect, unspecified: Secondary | ICD-10-CM | POA: Diagnosis not present

## 2020-07-11 DIAGNOSIS — Z992 Dependence on renal dialysis: Secondary | ICD-10-CM | POA: Diagnosis not present

## 2020-07-11 DIAGNOSIS — N2581 Secondary hyperparathyroidism of renal origin: Secondary | ICD-10-CM | POA: Diagnosis not present

## 2020-07-11 DIAGNOSIS — D631 Anemia in chronic kidney disease: Secondary | ICD-10-CM | POA: Diagnosis not present

## 2020-07-13 DIAGNOSIS — D631 Anemia in chronic kidney disease: Secondary | ICD-10-CM | POA: Diagnosis not present

## 2020-07-13 DIAGNOSIS — N186 End stage renal disease: Secondary | ICD-10-CM | POA: Diagnosis not present

## 2020-07-13 DIAGNOSIS — N2581 Secondary hyperparathyroidism of renal origin: Secondary | ICD-10-CM | POA: Diagnosis not present

## 2020-07-13 DIAGNOSIS — D689 Coagulation defect, unspecified: Secondary | ICD-10-CM | POA: Diagnosis not present

## 2020-07-13 DIAGNOSIS — Z992 Dependence on renal dialysis: Secondary | ICD-10-CM | POA: Diagnosis not present

## 2020-07-16 DIAGNOSIS — N2581 Secondary hyperparathyroidism of renal origin: Secondary | ICD-10-CM | POA: Diagnosis not present

## 2020-07-16 DIAGNOSIS — D689 Coagulation defect, unspecified: Secondary | ICD-10-CM | POA: Diagnosis not present

## 2020-07-16 DIAGNOSIS — N186 End stage renal disease: Secondary | ICD-10-CM | POA: Diagnosis not present

## 2020-07-16 DIAGNOSIS — Z992 Dependence on renal dialysis: Secondary | ICD-10-CM | POA: Diagnosis not present

## 2020-07-16 DIAGNOSIS — D631 Anemia in chronic kidney disease: Secondary | ICD-10-CM | POA: Diagnosis not present

## 2020-07-17 DIAGNOSIS — N186 End stage renal disease: Secondary | ICD-10-CM | POA: Diagnosis not present

## 2020-07-17 DIAGNOSIS — D631 Anemia in chronic kidney disease: Secondary | ICD-10-CM | POA: Diagnosis not present

## 2020-07-17 DIAGNOSIS — D689 Coagulation defect, unspecified: Secondary | ICD-10-CM | POA: Diagnosis not present

## 2020-07-17 DIAGNOSIS — Z992 Dependence on renal dialysis: Secondary | ICD-10-CM | POA: Diagnosis not present

## 2020-07-17 DIAGNOSIS — N2581 Secondary hyperparathyroidism of renal origin: Secondary | ICD-10-CM | POA: Diagnosis not present

## 2020-07-18 DIAGNOSIS — D631 Anemia in chronic kidney disease: Secondary | ICD-10-CM | POA: Diagnosis not present

## 2020-07-18 DIAGNOSIS — Z992 Dependence on renal dialysis: Secondary | ICD-10-CM | POA: Diagnosis not present

## 2020-07-18 DIAGNOSIS — N2581 Secondary hyperparathyroidism of renal origin: Secondary | ICD-10-CM | POA: Diagnosis not present

## 2020-07-18 DIAGNOSIS — N186 End stage renal disease: Secondary | ICD-10-CM | POA: Diagnosis not present

## 2020-07-18 DIAGNOSIS — D689 Coagulation defect, unspecified: Secondary | ICD-10-CM | POA: Diagnosis not present

## 2020-07-20 DIAGNOSIS — D631 Anemia in chronic kidney disease: Secondary | ICD-10-CM | POA: Diagnosis not present

## 2020-07-20 DIAGNOSIS — D689 Coagulation defect, unspecified: Secondary | ICD-10-CM | POA: Diagnosis not present

## 2020-07-20 DIAGNOSIS — N2581 Secondary hyperparathyroidism of renal origin: Secondary | ICD-10-CM | POA: Diagnosis not present

## 2020-07-20 DIAGNOSIS — Z992 Dependence on renal dialysis: Secondary | ICD-10-CM | POA: Diagnosis not present

## 2020-07-20 DIAGNOSIS — N186 End stage renal disease: Secondary | ICD-10-CM | POA: Diagnosis not present

## 2020-07-23 DIAGNOSIS — D689 Coagulation defect, unspecified: Secondary | ICD-10-CM | POA: Diagnosis not present

## 2020-07-23 DIAGNOSIS — N2581 Secondary hyperparathyroidism of renal origin: Secondary | ICD-10-CM | POA: Diagnosis not present

## 2020-07-23 DIAGNOSIS — N186 End stage renal disease: Secondary | ICD-10-CM | POA: Diagnosis not present

## 2020-07-23 DIAGNOSIS — Z992 Dependence on renal dialysis: Secondary | ICD-10-CM | POA: Diagnosis not present

## 2020-07-23 DIAGNOSIS — D631 Anemia in chronic kidney disease: Secondary | ICD-10-CM | POA: Diagnosis not present

## 2020-07-25 DIAGNOSIS — D689 Coagulation defect, unspecified: Secondary | ICD-10-CM | POA: Diagnosis not present

## 2020-07-25 DIAGNOSIS — D631 Anemia in chronic kidney disease: Secondary | ICD-10-CM | POA: Diagnosis not present

## 2020-07-25 DIAGNOSIS — N186 End stage renal disease: Secondary | ICD-10-CM | POA: Diagnosis not present

## 2020-07-25 DIAGNOSIS — Z992 Dependence on renal dialysis: Secondary | ICD-10-CM | POA: Diagnosis not present

## 2020-07-25 DIAGNOSIS — N2581 Secondary hyperparathyroidism of renal origin: Secondary | ICD-10-CM | POA: Diagnosis not present

## 2020-07-27 DIAGNOSIS — D689 Coagulation defect, unspecified: Secondary | ICD-10-CM | POA: Diagnosis not present

## 2020-07-27 DIAGNOSIS — N2581 Secondary hyperparathyroidism of renal origin: Secondary | ICD-10-CM | POA: Diagnosis not present

## 2020-07-27 DIAGNOSIS — Z992 Dependence on renal dialysis: Secondary | ICD-10-CM | POA: Diagnosis not present

## 2020-07-27 DIAGNOSIS — D631 Anemia in chronic kidney disease: Secondary | ICD-10-CM | POA: Diagnosis not present

## 2020-07-27 DIAGNOSIS — N186 End stage renal disease: Secondary | ICD-10-CM | POA: Diagnosis not present

## 2020-07-30 DIAGNOSIS — D689 Coagulation defect, unspecified: Secondary | ICD-10-CM | POA: Diagnosis not present

## 2020-07-30 DIAGNOSIS — N186 End stage renal disease: Secondary | ICD-10-CM | POA: Diagnosis not present

## 2020-07-30 DIAGNOSIS — D631 Anemia in chronic kidney disease: Secondary | ICD-10-CM | POA: Diagnosis not present

## 2020-07-30 DIAGNOSIS — Z992 Dependence on renal dialysis: Secondary | ICD-10-CM | POA: Diagnosis not present

## 2020-07-30 DIAGNOSIS — I129 Hypertensive chronic kidney disease with stage 1 through stage 4 chronic kidney disease, or unspecified chronic kidney disease: Secondary | ICD-10-CM | POA: Diagnosis not present

## 2020-07-30 DIAGNOSIS — N2581 Secondary hyperparathyroidism of renal origin: Secondary | ICD-10-CM | POA: Diagnosis not present

## 2020-08-01 DIAGNOSIS — N2581 Secondary hyperparathyroidism of renal origin: Secondary | ICD-10-CM | POA: Diagnosis not present

## 2020-08-01 DIAGNOSIS — Z992 Dependence on renal dialysis: Secondary | ICD-10-CM | POA: Diagnosis not present

## 2020-08-01 DIAGNOSIS — N186 End stage renal disease: Secondary | ICD-10-CM | POA: Diagnosis not present

## 2020-08-01 DIAGNOSIS — D689 Coagulation defect, unspecified: Secondary | ICD-10-CM | POA: Diagnosis not present

## 2020-08-03 DIAGNOSIS — N186 End stage renal disease: Secondary | ICD-10-CM | POA: Diagnosis not present

## 2020-08-03 DIAGNOSIS — D689 Coagulation defect, unspecified: Secondary | ICD-10-CM | POA: Diagnosis not present

## 2020-08-03 DIAGNOSIS — Z992 Dependence on renal dialysis: Secondary | ICD-10-CM | POA: Diagnosis not present

## 2020-08-03 DIAGNOSIS — N2581 Secondary hyperparathyroidism of renal origin: Secondary | ICD-10-CM | POA: Diagnosis not present

## 2020-08-06 DIAGNOSIS — N2581 Secondary hyperparathyroidism of renal origin: Secondary | ICD-10-CM | POA: Diagnosis not present

## 2020-08-06 DIAGNOSIS — D631 Anemia in chronic kidney disease: Secondary | ICD-10-CM | POA: Diagnosis not present

## 2020-08-06 DIAGNOSIS — D689 Coagulation defect, unspecified: Secondary | ICD-10-CM | POA: Diagnosis not present

## 2020-08-06 DIAGNOSIS — N186 End stage renal disease: Secondary | ICD-10-CM | POA: Diagnosis not present

## 2020-08-06 DIAGNOSIS — Z992 Dependence on renal dialysis: Secondary | ICD-10-CM | POA: Diagnosis not present

## 2020-08-08 DIAGNOSIS — D689 Coagulation defect, unspecified: Secondary | ICD-10-CM | POA: Diagnosis not present

## 2020-08-08 DIAGNOSIS — D631 Anemia in chronic kidney disease: Secondary | ICD-10-CM | POA: Diagnosis not present

## 2020-08-08 DIAGNOSIS — Z992 Dependence on renal dialysis: Secondary | ICD-10-CM | POA: Diagnosis not present

## 2020-08-08 DIAGNOSIS — N2581 Secondary hyperparathyroidism of renal origin: Secondary | ICD-10-CM | POA: Diagnosis not present

## 2020-08-08 DIAGNOSIS — N186 End stage renal disease: Secondary | ICD-10-CM | POA: Diagnosis not present

## 2020-08-10 DIAGNOSIS — N2581 Secondary hyperparathyroidism of renal origin: Secondary | ICD-10-CM | POA: Diagnosis not present

## 2020-08-10 DIAGNOSIS — Z992 Dependence on renal dialysis: Secondary | ICD-10-CM | POA: Diagnosis not present

## 2020-08-10 DIAGNOSIS — N186 End stage renal disease: Secondary | ICD-10-CM | POA: Diagnosis not present

## 2020-08-10 DIAGNOSIS — D689 Coagulation defect, unspecified: Secondary | ICD-10-CM | POA: Diagnosis not present

## 2020-08-10 DIAGNOSIS — D631 Anemia in chronic kidney disease: Secondary | ICD-10-CM | POA: Diagnosis not present

## 2020-08-13 DIAGNOSIS — N2581 Secondary hyperparathyroidism of renal origin: Secondary | ICD-10-CM | POA: Diagnosis not present

## 2020-08-13 DIAGNOSIS — D689 Coagulation defect, unspecified: Secondary | ICD-10-CM | POA: Diagnosis not present

## 2020-08-13 DIAGNOSIS — Z992 Dependence on renal dialysis: Secondary | ICD-10-CM | POA: Diagnosis not present

## 2020-08-13 DIAGNOSIS — N186 End stage renal disease: Secondary | ICD-10-CM | POA: Diagnosis not present

## 2020-08-15 DIAGNOSIS — D689 Coagulation defect, unspecified: Secondary | ICD-10-CM | POA: Diagnosis not present

## 2020-08-15 DIAGNOSIS — N2581 Secondary hyperparathyroidism of renal origin: Secondary | ICD-10-CM | POA: Diagnosis not present

## 2020-08-15 DIAGNOSIS — Z992 Dependence on renal dialysis: Secondary | ICD-10-CM | POA: Diagnosis not present

## 2020-08-15 DIAGNOSIS — N186 End stage renal disease: Secondary | ICD-10-CM | POA: Diagnosis not present

## 2020-08-16 DIAGNOSIS — Z794 Long term (current) use of insulin: Secondary | ICD-10-CM | POA: Diagnosis not present

## 2020-08-16 DIAGNOSIS — I10 Essential (primary) hypertension: Secondary | ICD-10-CM | POA: Diagnosis not present

## 2020-08-16 DIAGNOSIS — N186 End stage renal disease: Secondary | ICD-10-CM | POA: Diagnosis not present

## 2020-08-16 DIAGNOSIS — E789 Disorder of lipoprotein metabolism, unspecified: Secondary | ICD-10-CM | POA: Diagnosis not present

## 2020-08-16 DIAGNOSIS — E1122 Type 2 diabetes mellitus with diabetic chronic kidney disease: Secondary | ICD-10-CM | POA: Diagnosis not present

## 2020-08-17 DIAGNOSIS — N2581 Secondary hyperparathyroidism of renal origin: Secondary | ICD-10-CM | POA: Diagnosis not present

## 2020-08-17 DIAGNOSIS — D689 Coagulation defect, unspecified: Secondary | ICD-10-CM | POA: Diagnosis not present

## 2020-08-17 DIAGNOSIS — N186 End stage renal disease: Secondary | ICD-10-CM | POA: Diagnosis not present

## 2020-08-17 DIAGNOSIS — Z992 Dependence on renal dialysis: Secondary | ICD-10-CM | POA: Diagnosis not present

## 2020-08-20 DIAGNOSIS — N2581 Secondary hyperparathyroidism of renal origin: Secondary | ICD-10-CM | POA: Diagnosis not present

## 2020-08-20 DIAGNOSIS — Z992 Dependence on renal dialysis: Secondary | ICD-10-CM | POA: Diagnosis not present

## 2020-08-20 DIAGNOSIS — D689 Coagulation defect, unspecified: Secondary | ICD-10-CM | POA: Diagnosis not present

## 2020-08-20 DIAGNOSIS — N186 End stage renal disease: Secondary | ICD-10-CM | POA: Diagnosis not present

## 2020-08-21 ENCOUNTER — Ambulatory Visit: Payer: Medicare Other

## 2020-08-22 DIAGNOSIS — N186 End stage renal disease: Secondary | ICD-10-CM | POA: Diagnosis not present

## 2020-08-22 DIAGNOSIS — Z992 Dependence on renal dialysis: Secondary | ICD-10-CM | POA: Diagnosis not present

## 2020-08-22 DIAGNOSIS — N2581 Secondary hyperparathyroidism of renal origin: Secondary | ICD-10-CM | POA: Diagnosis not present

## 2020-08-22 DIAGNOSIS — D689 Coagulation defect, unspecified: Secondary | ICD-10-CM | POA: Diagnosis not present

## 2020-08-24 DIAGNOSIS — Z992 Dependence on renal dialysis: Secondary | ICD-10-CM | POA: Diagnosis not present

## 2020-08-24 DIAGNOSIS — N186 End stage renal disease: Secondary | ICD-10-CM | POA: Diagnosis not present

## 2020-08-24 DIAGNOSIS — D689 Coagulation defect, unspecified: Secondary | ICD-10-CM | POA: Diagnosis not present

## 2020-08-24 DIAGNOSIS — N2581 Secondary hyperparathyroidism of renal origin: Secondary | ICD-10-CM | POA: Diagnosis not present

## 2020-08-27 DIAGNOSIS — Z992 Dependence on renal dialysis: Secondary | ICD-10-CM | POA: Diagnosis not present

## 2020-08-27 DIAGNOSIS — N2581 Secondary hyperparathyroidism of renal origin: Secondary | ICD-10-CM | POA: Diagnosis not present

## 2020-08-27 DIAGNOSIS — D689 Coagulation defect, unspecified: Secondary | ICD-10-CM | POA: Diagnosis not present

## 2020-08-27 DIAGNOSIS — N186 End stage renal disease: Secondary | ICD-10-CM | POA: Diagnosis not present

## 2020-08-28 ENCOUNTER — Encounter: Payer: Self-pay | Admitting: Physician Assistant

## 2020-08-28 ENCOUNTER — Other Ambulatory Visit: Payer: Self-pay

## 2020-08-28 ENCOUNTER — Ambulatory Visit (INDEPENDENT_AMBULATORY_CARE_PROVIDER_SITE_OTHER): Payer: Medicare Other | Admitting: Physician Assistant

## 2020-08-28 VITALS — BP 113/77 | HR 92 | Temp 97.8°F | Resp 20 | Ht 66.0 in | Wt 330.7 lb

## 2020-08-28 DIAGNOSIS — N186 End stage renal disease: Secondary | ICD-10-CM

## 2020-08-28 DIAGNOSIS — Z992 Dependence on renal dialysis: Secondary | ICD-10-CM | POA: Diagnosis not present

## 2020-08-28 NOTE — Progress Notes (Signed)
Office Note     CC:  follow up Requesting Provider:  Deland Pretty, MD  HPI: [name] is a 65 y.o. 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  This fistula was created by Dr. Vallarie Mare in 2017 which required superficial cessation in June 2017.  She is dialyzing on a Monday Wednesday Friday schedule under the care of Dr. Hollie Salk.  She denies any wounds overlying aneurysmal degeneration.  She has no trouble completing hemodialysis treatments.  She denies prolonged needle hole bleeding.  She states that she had a fistulogram at [facility] about 1 to 2 months ago which involved ballooning the fistula near her shoulder.  She is here today with her sister.   Past Medical History:  Diagnosis Date  . Arthritis   . Chronic diastolic CHF (congestive heart failure) ([place])    a. Echo 5/17: severe LVH, EF 55-60%, mild AS, mean AV gradient 10 mmHg, mod LAE, PASP 37 mmHg  . Diabetes mellitus without complication ([place])    type 2  . ESRD (end stage renal disease) ([facility])    Tues, Thurs, Sat dialysis  . H/O blood clots    many years ago in leg  . History of pneumonia   . HLD (hyperlipidemia)   . Hypertension    BP low after dialysis started, no further BP meds  . Seizures ([place])    had one or two (more than 10-15 years ago)  . Sleep apnea    uses c-pap most of time    Past Surgical History:  Procedure Laterality Date  . ABDOMINAL HYSTERECTOMY    . AV FISTULA PLACEMENT Left 07/25/2015   Procedure: ARTERIOVENOUS (AV) FISTULA CREATION -LEFT BRACHIOCEPHALIC;  Surgeon: Conrad Woodville, MD;  Location: [facility];  Service: Vascular;  Laterality: Left;  . COLONOSCOPY    . COLONOSCOPY WITH PROPOFOL N/A 11/18/2017   Procedure: COLONOSCOPY WITH PROPOFOL;  Surgeon: Arta Silence, MD;  Location: [facility];  Service: Endoscopy;  Laterality: N/A;  . FISTULA SUPERFICIALIZATION Left 11/01/2015   Procedure: SUPERFICIALIZATION OF LEFT ARM BRACHIOCEPHALIC  FISTULA ;  Surgeon: Conrad Comer, MD;  Location: [place] [facility];  Service: Vascular;  Laterality: Left;  . INSERTION OF DIALYSIS CATHETER Right 10/09/2015   Procedure: INSERTION OF DIALYSIS CATHETER RIGHT INTERNAL JUGULAR ;  Surgeon: Angelia Mould, MD;  Location: [place] [facility];  Service: Vascular;  Laterality: Right;  . POLYPECTOMY  11/18/2017   Procedure: POLYPECTOMY;  Surgeon: Arta Silence, MD;  Location: [facility];  Service: Endoscopy;;    Social History   Socioeconomic History  . Marital status: Single    Spouse name: Not on file  . Number of children: Not on file  . Years of education: Not on file  . Highest education level: Not on file  Occupational History  . Not on file  Tobacco Use  . Smoking status: Never Smoker  . Smokeless tobacco: Never Used  Vaping Use  . Vaping Use: Never used  Substance and Sexual Activity  . Alcohol use: No    Alcohol/week: 0.0 standard drinks  . Drug use: No  . Sexual activity: Not on file  Other Topics Concern  . Not on file  Social History Narrative   epworth sleepiness scale score 12   Social Determinants of Health   Financial Resource Strain: Not on file  Food Insecurity: Not on file  Transportation Needs: Not on file  Physical Activity: Not on file  Stress: Not on file  Social Connections: Not on file  Intimate Partner Violence: Not on file    Family History  Problem Relation Age of Onset  . Cancer Mother   . Hypertension Father   . Heart attack Sister   . Hypertension Sister   . Heart attack Paternal Aunt   . Hypertension Brother   . Hypertension Sister   . Hypertension Sister   . Hypertension Sister   . Hypertension Brother   . Stroke Neg Hx   . Breast cancer Neg Hx     Current Outpatient Medications  Medication Sig Dispense Refill  . aspirin 81 MG tablet Take 81 mg by mouth daily.    Marland Kitchen atorvastatin (LIPITOR) 40 MG tablet Take 40 mg by mouth daily.    . cinacalcet (SENSIPAR) 60 MG tablet Take 60 mg by mouth daily.      Marland Kitchen docusate sodium (COLACE) 50 MG capsule Take 100 mg by mouth 2 (two) times daily.     Marland Kitchen doxercalciferol (HECTOROL) 0.5 MCG capsule Doxercalciferol (Hectorol)    . febuxostat (ULORIC) 40 MG tablet Take 40 mg by mouth daily.    Marland Kitchen FOSRENOL 1000 MG chewable tablet Chew 1,000 mg by mouth 3 (three) times daily with meals.    . insulin aspart (FIASP FLEXTOUCH) 100 UNIT/ML FlexTouch Pen Inject into the skin.    Marland Kitchen lidocaine-prilocaine (EMLA) cream Apply 1 application topically as needed (local anesthesia).    . loratadine (CLARITIN) 10 MG tablet Take 1 tablet by mouth daily.     . metoprolol succinate (TOPROL-XL) 25 MG 24 hr tablet Take 12.5 mg by mouth every other day. DO NOT TAKE THE NIGHT PRIOR TO DIALYSIS    . multivitamin (RENA-VIT) TABS tablet Take 1 tablet by mouth daily. 30 each 0  . ONETOUCH VERIO test strip TEST 2 TIMES A DAY AND AS NEEDED FOR BLOOD SUGAR  0  . TRADJENTA 5 MG TABS tablet     . TRESIBA FLEXTOUCH 100 UNIT/ML SOPN FlexTouch Pen Inject 14 Units into the skin daily. Dinner  0  . HUMALOG KWIKPEN 100 UNIT/ML KwikPen      No current facility-administered medications for this visit.    Allergies  Allergen Reactions  . Morphine And Related Other (See Comments)    Per patient, morphine causes seizures     REVIEW OF SYSTEMS:   [X]  denotes positive finding, [ ]  denotes negative finding Cardiac  Comments:  Chest pain or chest pressure:    Shortness of breath upon exertion:    Short of breath when lying flat:    Irregular heart rhythm:        Vascular    Pain in calf, thigh, or hip brought on by ambulation:    Pain in feet at night that wakes you up from your sleep:     Blood clot in your veins:    Leg swelling:         Pulmonary    Oxygen at home:    Productive cough:     Wheezing:         Neurologic    Sudden weakness in arms or legs:     Sudden numbness in arms or legs:     Sudden onset of difficulty speaking or slurred speech:    Temporary loss of vision in  one eye:     Problems with dizziness:         Gastrointestinal    Blood in stool:     Vomited blood:  Genitourinary    Burning when urinating:     Blood in urine:        Psychiatric    Major depression:         Hematologic    Bleeding problems:    Problems with blood clotting too easily:        Skin    Rashes or ulcers:        Constitutional    Fever or chills:      PHYSICAL EXAMINATION:  Vitals:   08/28/20 0850  BP: 113/77  Pulse: 92  Resp: 20  Temp: 97.8 F (36.6 C)  TempSrc: Temporal  SpO2: 97%  Weight: (!) 330 lb 11 oz (150 kg)  Height: 5\' 6"  (1.676 m)    General:  WDWN in NAD; vital signs documented above Gait: Not observed HENT: WNL, normocephalic Pulmonary: normal non-labored breathing Cardiac: regular HR Abdomen: soft, NT, no masses Skin: without rashes Vascular Exam/Pulses:  Right Left  Radial 2+ (normal) 2+ (normal)   Extremities: without ischemic changes, without Gangrene , without cellulitis; without open wounds; palpable thrill throughout left brachiocephalic fistula; 1 aneurysmal area however overlying skin is mobile without any wounds Musculoskeletal: no muscle wasting or atrophy  Neurologic: A&O X 3;  No focal weakness or paresthesias are detected Psychiatric:  The pt has Normal affect.     ASSESSMENT/PLAN:: 65 y.o. female here for evaluation of aneurysmal area of left brachiocephalic fistula   -Fistula is widely patent with a palpable thrill throughout the upper arm -Patient states she had what sounds like ballooning of the outflow about 1 to 2 months ago at [facility]; she denies needle hole bleeding -Skin overlying aneurysmal area of fistula is mobile and without any wounds; no risk for spontaneous bleeding -Based on physical examination we will continue to monitor fistula; if in the future she has needle hole bleeding or increase in size of aneurysmal areas we can consider a repeat fistulogram; she can also return for  evaluation if she develops any wounds overlying the aneurysmal areas of the fistula.  For now, she will follow-up as needed   Dagoberto Ligas, PA-C [facility] [phone]  Clinic MD:   Carlis Abbott

## 2020-08-29 DIAGNOSIS — D689 Coagulation defect, unspecified: Secondary | ICD-10-CM | POA: Diagnosis not present

## 2020-08-29 DIAGNOSIS — Z992 Dependence on renal dialysis: Secondary | ICD-10-CM | POA: Diagnosis not present

## 2020-08-29 DIAGNOSIS — N2581 Secondary hyperparathyroidism of renal origin: Secondary | ICD-10-CM | POA: Diagnosis not present

## 2020-08-29 DIAGNOSIS — N186 End stage renal disease: Secondary | ICD-10-CM | POA: Diagnosis not present

## 2020-08-30 DIAGNOSIS — Z992 Dependence on renal dialysis: Secondary | ICD-10-CM | POA: Diagnosis not present

## 2020-08-30 DIAGNOSIS — I129 Hypertensive chronic kidney disease with stage 1 through stage 4 chronic kidney disease, or unspecified chronic kidney disease: Secondary | ICD-10-CM | POA: Diagnosis not present

## 2020-08-30 DIAGNOSIS — N186 End stage renal disease: Secondary | ICD-10-CM | POA: Diagnosis not present

## 2020-08-31 DIAGNOSIS — D689 Coagulation defect, unspecified: Secondary | ICD-10-CM | POA: Diagnosis not present

## 2020-08-31 DIAGNOSIS — Z992 Dependence on renal dialysis: Secondary | ICD-10-CM | POA: Diagnosis not present

## 2020-08-31 DIAGNOSIS — N2581 Secondary hyperparathyroidism of renal origin: Secondary | ICD-10-CM | POA: Diagnosis not present

## 2020-08-31 DIAGNOSIS — N186 End stage renal disease: Secondary | ICD-10-CM | POA: Diagnosis not present

## 2020-09-03 DIAGNOSIS — D689 Coagulation defect, unspecified: Secondary | ICD-10-CM | POA: Diagnosis not present

## 2020-09-03 DIAGNOSIS — N2581 Secondary hyperparathyroidism of renal origin: Secondary | ICD-10-CM | POA: Diagnosis not present

## 2020-09-03 DIAGNOSIS — Z992 Dependence on renal dialysis: Secondary | ICD-10-CM | POA: Diagnosis not present

## 2020-09-03 DIAGNOSIS — N186 End stage renal disease: Secondary | ICD-10-CM | POA: Diagnosis not present

## 2020-09-05 DIAGNOSIS — Z992 Dependence on renal dialysis: Secondary | ICD-10-CM | POA: Diagnosis not present

## 2020-09-05 DIAGNOSIS — D689 Coagulation defect, unspecified: Secondary | ICD-10-CM | POA: Diagnosis not present

## 2020-09-05 DIAGNOSIS — N186 End stage renal disease: Secondary | ICD-10-CM | POA: Diagnosis not present

## 2020-09-05 DIAGNOSIS — N2581 Secondary hyperparathyroidism of renal origin: Secondary | ICD-10-CM | POA: Diagnosis not present

## 2020-09-07 DIAGNOSIS — D689 Coagulation defect, unspecified: Secondary | ICD-10-CM | POA: Diagnosis not present

## 2020-09-07 DIAGNOSIS — Z992 Dependence on renal dialysis: Secondary | ICD-10-CM | POA: Diagnosis not present

## 2020-09-07 DIAGNOSIS — N2581 Secondary hyperparathyroidism of renal origin: Secondary | ICD-10-CM | POA: Diagnosis not present

## 2020-09-07 DIAGNOSIS — N186 End stage renal disease: Secondary | ICD-10-CM | POA: Diagnosis not present

## 2020-09-10 DIAGNOSIS — E1122 Type 2 diabetes mellitus with diabetic chronic kidney disease: Secondary | ICD-10-CM | POA: Diagnosis not present

## 2020-09-10 DIAGNOSIS — Z992 Dependence on renal dialysis: Secondary | ICD-10-CM | POA: Diagnosis not present

## 2020-09-10 DIAGNOSIS — D689 Coagulation defect, unspecified: Secondary | ICD-10-CM | POA: Diagnosis not present

## 2020-09-10 DIAGNOSIS — N2581 Secondary hyperparathyroidism of renal origin: Secondary | ICD-10-CM | POA: Diagnosis not present

## 2020-09-10 DIAGNOSIS — N186 End stage renal disease: Secondary | ICD-10-CM | POA: Diagnosis not present

## 2020-09-10 DIAGNOSIS — D631 Anemia in chronic kidney disease: Secondary | ICD-10-CM | POA: Diagnosis not present

## 2020-09-11 DIAGNOSIS — I10 Essential (primary) hypertension: Secondary | ICD-10-CM | POA: Diagnosis not present

## 2020-09-11 DIAGNOSIS — E118 Type 2 diabetes mellitus with unspecified complications: Secondary | ICD-10-CM | POA: Diagnosis not present

## 2020-09-11 DIAGNOSIS — R749 Abnormal serum enzyme level, unspecified: Secondary | ICD-10-CM | POA: Diagnosis not present

## 2020-09-12 DIAGNOSIS — Z992 Dependence on renal dialysis: Secondary | ICD-10-CM | POA: Diagnosis not present

## 2020-09-12 DIAGNOSIS — D689 Coagulation defect, unspecified: Secondary | ICD-10-CM | POA: Diagnosis not present

## 2020-09-12 DIAGNOSIS — D631 Anemia in chronic kidney disease: Secondary | ICD-10-CM | POA: Diagnosis not present

## 2020-09-12 DIAGNOSIS — E1122 Type 2 diabetes mellitus with diabetic chronic kidney disease: Secondary | ICD-10-CM | POA: Diagnosis not present

## 2020-09-12 DIAGNOSIS — N186 End stage renal disease: Secondary | ICD-10-CM | POA: Diagnosis not present

## 2020-09-12 DIAGNOSIS — N2581 Secondary hyperparathyroidism of renal origin: Secondary | ICD-10-CM | POA: Diagnosis not present

## 2020-09-13 DIAGNOSIS — M109 Gout, unspecified: Secondary | ICD-10-CM | POA: Diagnosis not present

## 2020-09-13 DIAGNOSIS — N2581 Secondary hyperparathyroidism of renal origin: Secondary | ICD-10-CM | POA: Diagnosis not present

## 2020-09-13 DIAGNOSIS — I10 Essential (primary) hypertension: Secondary | ICD-10-CM | POA: Diagnosis not present

## 2020-09-13 DIAGNOSIS — I5032 Chronic diastolic (congestive) heart failure: Secondary | ICD-10-CM | POA: Diagnosis not present

## 2020-09-13 DIAGNOSIS — Z0000 Encounter for general adult medical examination without abnormal findings: Secondary | ICD-10-CM | POA: Diagnosis not present

## 2020-09-13 DIAGNOSIS — G4733 Obstructive sleep apnea (adult) (pediatric): Secondary | ICD-10-CM | POA: Diagnosis not present

## 2020-09-13 DIAGNOSIS — N186 End stage renal disease: Secondary | ICD-10-CM | POA: Diagnosis not present

## 2020-09-14 DIAGNOSIS — D689 Coagulation defect, unspecified: Secondary | ICD-10-CM | POA: Diagnosis not present

## 2020-09-14 DIAGNOSIS — N2581 Secondary hyperparathyroidism of renal origin: Secondary | ICD-10-CM | POA: Diagnosis not present

## 2020-09-14 DIAGNOSIS — D631 Anemia in chronic kidney disease: Secondary | ICD-10-CM | POA: Diagnosis not present

## 2020-09-14 DIAGNOSIS — Z992 Dependence on renal dialysis: Secondary | ICD-10-CM | POA: Diagnosis not present

## 2020-09-14 DIAGNOSIS — N186 End stage renal disease: Secondary | ICD-10-CM | POA: Diagnosis not present

## 2020-09-14 DIAGNOSIS — E1122 Type 2 diabetes mellitus with diabetic chronic kidney disease: Secondary | ICD-10-CM | POA: Diagnosis not present

## 2020-09-17 DIAGNOSIS — Z992 Dependence on renal dialysis: Secondary | ICD-10-CM | POA: Diagnosis not present

## 2020-09-17 DIAGNOSIS — N186 End stage renal disease: Secondary | ICD-10-CM | POA: Diagnosis not present

## 2020-09-17 DIAGNOSIS — D689 Coagulation defect, unspecified: Secondary | ICD-10-CM | POA: Diagnosis not present

## 2020-09-17 DIAGNOSIS — D631 Anemia in chronic kidney disease: Secondary | ICD-10-CM | POA: Diagnosis not present

## 2020-09-17 DIAGNOSIS — N2581 Secondary hyperparathyroidism of renal origin: Secondary | ICD-10-CM | POA: Diagnosis not present

## 2020-09-19 DIAGNOSIS — Z992 Dependence on renal dialysis: Secondary | ICD-10-CM | POA: Diagnosis not present

## 2020-09-19 DIAGNOSIS — N186 End stage renal disease: Secondary | ICD-10-CM | POA: Diagnosis not present

## 2020-09-19 DIAGNOSIS — D631 Anemia in chronic kidney disease: Secondary | ICD-10-CM | POA: Diagnosis not present

## 2020-09-19 DIAGNOSIS — D689 Coagulation defect, unspecified: Secondary | ICD-10-CM | POA: Diagnosis not present

## 2020-09-19 DIAGNOSIS — N2581 Secondary hyperparathyroidism of renal origin: Secondary | ICD-10-CM | POA: Diagnosis not present

## 2020-09-21 DIAGNOSIS — D631 Anemia in chronic kidney disease: Secondary | ICD-10-CM | POA: Diagnosis not present

## 2020-09-21 DIAGNOSIS — N186 End stage renal disease: Secondary | ICD-10-CM | POA: Diagnosis not present

## 2020-09-21 DIAGNOSIS — D689 Coagulation defect, unspecified: Secondary | ICD-10-CM | POA: Diagnosis not present

## 2020-09-21 DIAGNOSIS — N2581 Secondary hyperparathyroidism of renal origin: Secondary | ICD-10-CM | POA: Diagnosis not present

## 2020-09-21 DIAGNOSIS — Z992 Dependence on renal dialysis: Secondary | ICD-10-CM | POA: Diagnosis not present

## 2020-09-24 DIAGNOSIS — D631 Anemia in chronic kidney disease: Secondary | ICD-10-CM | POA: Diagnosis not present

## 2020-09-24 DIAGNOSIS — R52 Pain, unspecified: Secondary | ICD-10-CM | POA: Diagnosis not present

## 2020-09-24 DIAGNOSIS — D689 Coagulation defect, unspecified: Secondary | ICD-10-CM | POA: Diagnosis not present

## 2020-09-24 DIAGNOSIS — N2581 Secondary hyperparathyroidism of renal origin: Secondary | ICD-10-CM | POA: Diagnosis not present

## 2020-09-24 DIAGNOSIS — Z992 Dependence on renal dialysis: Secondary | ICD-10-CM | POA: Diagnosis not present

## 2020-09-24 DIAGNOSIS — N186 End stage renal disease: Secondary | ICD-10-CM | POA: Diagnosis not present

## 2020-09-26 DIAGNOSIS — N2581 Secondary hyperparathyroidism of renal origin: Secondary | ICD-10-CM | POA: Diagnosis not present

## 2020-09-26 DIAGNOSIS — Z992 Dependence on renal dialysis: Secondary | ICD-10-CM | POA: Diagnosis not present

## 2020-09-26 DIAGNOSIS — D689 Coagulation defect, unspecified: Secondary | ICD-10-CM | POA: Diagnosis not present

## 2020-09-26 DIAGNOSIS — R52 Pain, unspecified: Secondary | ICD-10-CM | POA: Diagnosis not present

## 2020-09-26 DIAGNOSIS — N186 End stage renal disease: Secondary | ICD-10-CM | POA: Diagnosis not present

## 2020-09-26 DIAGNOSIS — D631 Anemia in chronic kidney disease: Secondary | ICD-10-CM | POA: Diagnosis not present

## 2020-09-28 DIAGNOSIS — N2581 Secondary hyperparathyroidism of renal origin: Secondary | ICD-10-CM | POA: Diagnosis not present

## 2020-09-28 DIAGNOSIS — D689 Coagulation defect, unspecified: Secondary | ICD-10-CM | POA: Diagnosis not present

## 2020-09-28 DIAGNOSIS — Z992 Dependence on renal dialysis: Secondary | ICD-10-CM | POA: Diagnosis not present

## 2020-09-28 DIAGNOSIS — R52 Pain, unspecified: Secondary | ICD-10-CM | POA: Diagnosis not present

## 2020-09-28 DIAGNOSIS — N186 End stage renal disease: Secondary | ICD-10-CM | POA: Diagnosis not present

## 2020-09-28 DIAGNOSIS — D631 Anemia in chronic kidney disease: Secondary | ICD-10-CM | POA: Diagnosis not present

## 2020-09-29 DIAGNOSIS — I129 Hypertensive chronic kidney disease with stage 1 through stage 4 chronic kidney disease, or unspecified chronic kidney disease: Secondary | ICD-10-CM | POA: Diagnosis not present

## 2020-09-29 DIAGNOSIS — Z992 Dependence on renal dialysis: Secondary | ICD-10-CM | POA: Diagnosis not present

## 2020-09-29 DIAGNOSIS — N186 End stage renal disease: Secondary | ICD-10-CM | POA: Diagnosis not present

## 2020-10-01 DIAGNOSIS — N2581 Secondary hyperparathyroidism of renal origin: Secondary | ICD-10-CM | POA: Diagnosis not present

## 2020-10-01 DIAGNOSIS — N186 End stage renal disease: Secondary | ICD-10-CM | POA: Diagnosis not present

## 2020-10-01 DIAGNOSIS — D689 Coagulation defect, unspecified: Secondary | ICD-10-CM | POA: Diagnosis not present

## 2020-10-01 DIAGNOSIS — Z992 Dependence on renal dialysis: Secondary | ICD-10-CM | POA: Diagnosis not present

## 2020-10-03 DIAGNOSIS — D689 Coagulation defect, unspecified: Secondary | ICD-10-CM | POA: Diagnosis not present

## 2020-10-03 DIAGNOSIS — Z992 Dependence on renal dialysis: Secondary | ICD-10-CM | POA: Diagnosis not present

## 2020-10-03 DIAGNOSIS — N2581 Secondary hyperparathyroidism of renal origin: Secondary | ICD-10-CM | POA: Diagnosis not present

## 2020-10-03 DIAGNOSIS — N186 End stage renal disease: Secondary | ICD-10-CM | POA: Diagnosis not present

## 2020-10-05 DIAGNOSIS — N186 End stage renal disease: Secondary | ICD-10-CM | POA: Diagnosis not present

## 2020-10-05 DIAGNOSIS — N2581 Secondary hyperparathyroidism of renal origin: Secondary | ICD-10-CM | POA: Diagnosis not present

## 2020-10-05 DIAGNOSIS — Z992 Dependence on renal dialysis: Secondary | ICD-10-CM | POA: Diagnosis not present

## 2020-10-05 DIAGNOSIS — D689 Coagulation defect, unspecified: Secondary | ICD-10-CM | POA: Diagnosis not present

## 2020-10-08 DIAGNOSIS — D631 Anemia in chronic kidney disease: Secondary | ICD-10-CM | POA: Diagnosis not present

## 2020-10-08 DIAGNOSIS — Z992 Dependence on renal dialysis: Secondary | ICD-10-CM | POA: Diagnosis not present

## 2020-10-08 DIAGNOSIS — D689 Coagulation defect, unspecified: Secondary | ICD-10-CM | POA: Diagnosis not present

## 2020-10-08 DIAGNOSIS — N186 End stage renal disease: Secondary | ICD-10-CM | POA: Diagnosis not present

## 2020-10-08 DIAGNOSIS — N2581 Secondary hyperparathyroidism of renal origin: Secondary | ICD-10-CM | POA: Diagnosis not present

## 2020-10-10 DIAGNOSIS — D689 Coagulation defect, unspecified: Secondary | ICD-10-CM | POA: Diagnosis not present

## 2020-10-10 DIAGNOSIS — D631 Anemia in chronic kidney disease: Secondary | ICD-10-CM | POA: Diagnosis not present

## 2020-10-10 DIAGNOSIS — N186 End stage renal disease: Secondary | ICD-10-CM | POA: Diagnosis not present

## 2020-10-10 DIAGNOSIS — Z992 Dependence on renal dialysis: Secondary | ICD-10-CM | POA: Diagnosis not present

## 2020-10-10 DIAGNOSIS — N2581 Secondary hyperparathyroidism of renal origin: Secondary | ICD-10-CM | POA: Diagnosis not present

## 2020-10-12 DIAGNOSIS — Z992 Dependence on renal dialysis: Secondary | ICD-10-CM | POA: Diagnosis not present

## 2020-10-12 DIAGNOSIS — D631 Anemia in chronic kidney disease: Secondary | ICD-10-CM | POA: Diagnosis not present

## 2020-10-12 DIAGNOSIS — N2581 Secondary hyperparathyroidism of renal origin: Secondary | ICD-10-CM | POA: Diagnosis not present

## 2020-10-12 DIAGNOSIS — D689 Coagulation defect, unspecified: Secondary | ICD-10-CM | POA: Diagnosis not present

## 2020-10-12 DIAGNOSIS — N186 End stage renal disease: Secondary | ICD-10-CM | POA: Diagnosis not present

## 2020-10-15 DIAGNOSIS — D689 Coagulation defect, unspecified: Secondary | ICD-10-CM | POA: Diagnosis not present

## 2020-10-15 DIAGNOSIS — Z992 Dependence on renal dialysis: Secondary | ICD-10-CM | POA: Diagnosis not present

## 2020-10-15 DIAGNOSIS — N2581 Secondary hyperparathyroidism of renal origin: Secondary | ICD-10-CM | POA: Diagnosis not present

## 2020-10-15 DIAGNOSIS — N186 End stage renal disease: Secondary | ICD-10-CM | POA: Diagnosis not present

## 2020-10-15 DIAGNOSIS — Z23 Encounter for immunization: Secondary | ICD-10-CM | POA: Diagnosis not present

## 2020-10-17 DIAGNOSIS — Z992 Dependence on renal dialysis: Secondary | ICD-10-CM | POA: Diagnosis not present

## 2020-10-17 DIAGNOSIS — N2581 Secondary hyperparathyroidism of renal origin: Secondary | ICD-10-CM | POA: Diagnosis not present

## 2020-10-17 DIAGNOSIS — D689 Coagulation defect, unspecified: Secondary | ICD-10-CM | POA: Diagnosis not present

## 2020-10-17 DIAGNOSIS — N186 End stage renal disease: Secondary | ICD-10-CM | POA: Diagnosis not present

## 2020-10-17 DIAGNOSIS — Z23 Encounter for immunization: Secondary | ICD-10-CM | POA: Diagnosis not present

## 2020-10-19 DIAGNOSIS — D689 Coagulation defect, unspecified: Secondary | ICD-10-CM | POA: Diagnosis not present

## 2020-10-19 DIAGNOSIS — N186 End stage renal disease: Secondary | ICD-10-CM | POA: Diagnosis not present

## 2020-10-19 DIAGNOSIS — Z992 Dependence on renal dialysis: Secondary | ICD-10-CM | POA: Diagnosis not present

## 2020-10-19 DIAGNOSIS — Z23 Encounter for immunization: Secondary | ICD-10-CM | POA: Diagnosis not present

## 2020-10-19 DIAGNOSIS — N2581 Secondary hyperparathyroidism of renal origin: Secondary | ICD-10-CM | POA: Diagnosis not present

## 2020-10-22 DIAGNOSIS — N186 End stage renal disease: Secondary | ICD-10-CM | POA: Diagnosis not present

## 2020-10-22 DIAGNOSIS — N2581 Secondary hyperparathyroidism of renal origin: Secondary | ICD-10-CM | POA: Diagnosis not present

## 2020-10-22 DIAGNOSIS — Z992 Dependence on renal dialysis: Secondary | ICD-10-CM | POA: Diagnosis not present

## 2020-10-22 DIAGNOSIS — D689 Coagulation defect, unspecified: Secondary | ICD-10-CM | POA: Diagnosis not present

## 2020-10-22 DIAGNOSIS — D631 Anemia in chronic kidney disease: Secondary | ICD-10-CM | POA: Diagnosis not present

## 2020-10-22 DIAGNOSIS — R52 Pain, unspecified: Secondary | ICD-10-CM | POA: Diagnosis not present

## 2020-10-24 DIAGNOSIS — R52 Pain, unspecified: Secondary | ICD-10-CM | POA: Diagnosis not present

## 2020-10-24 DIAGNOSIS — N2581 Secondary hyperparathyroidism of renal origin: Secondary | ICD-10-CM | POA: Diagnosis not present

## 2020-10-24 DIAGNOSIS — Z992 Dependence on renal dialysis: Secondary | ICD-10-CM | POA: Diagnosis not present

## 2020-10-24 DIAGNOSIS — D631 Anemia in chronic kidney disease: Secondary | ICD-10-CM | POA: Diagnosis not present

## 2020-10-24 DIAGNOSIS — D689 Coagulation defect, unspecified: Secondary | ICD-10-CM | POA: Diagnosis not present

## 2020-10-24 DIAGNOSIS — N186 End stage renal disease: Secondary | ICD-10-CM | POA: Diagnosis not present

## 2020-10-26 DIAGNOSIS — R52 Pain, unspecified: Secondary | ICD-10-CM | POA: Diagnosis not present

## 2020-10-26 DIAGNOSIS — N186 End stage renal disease: Secondary | ICD-10-CM | POA: Diagnosis not present

## 2020-10-26 DIAGNOSIS — D631 Anemia in chronic kidney disease: Secondary | ICD-10-CM | POA: Diagnosis not present

## 2020-10-26 DIAGNOSIS — N2581 Secondary hyperparathyroidism of renal origin: Secondary | ICD-10-CM | POA: Diagnosis not present

## 2020-10-26 DIAGNOSIS — D689 Coagulation defect, unspecified: Secondary | ICD-10-CM | POA: Diagnosis not present

## 2020-10-26 DIAGNOSIS — Z992 Dependence on renal dialysis: Secondary | ICD-10-CM | POA: Diagnosis not present

## 2020-10-29 DIAGNOSIS — N2581 Secondary hyperparathyroidism of renal origin: Secondary | ICD-10-CM | POA: Diagnosis not present

## 2020-10-29 DIAGNOSIS — N186 End stage renal disease: Secondary | ICD-10-CM | POA: Diagnosis not present

## 2020-10-29 DIAGNOSIS — D689 Coagulation defect, unspecified: Secondary | ICD-10-CM | POA: Diagnosis not present

## 2020-10-29 DIAGNOSIS — Z992 Dependence on renal dialysis: Secondary | ICD-10-CM | POA: Diagnosis not present

## 2020-10-30 DIAGNOSIS — N186 End stage renal disease: Secondary | ICD-10-CM | POA: Diagnosis not present

## 2020-10-30 DIAGNOSIS — I129 Hypertensive chronic kidney disease with stage 1 through stage 4 chronic kidney disease, or unspecified chronic kidney disease: Secondary | ICD-10-CM | POA: Diagnosis not present

## 2020-10-30 DIAGNOSIS — Z992 Dependence on renal dialysis: Secondary | ICD-10-CM | POA: Diagnosis not present

## 2020-10-31 DIAGNOSIS — N186 End stage renal disease: Secondary | ICD-10-CM | POA: Diagnosis not present

## 2020-10-31 DIAGNOSIS — N2581 Secondary hyperparathyroidism of renal origin: Secondary | ICD-10-CM | POA: Diagnosis not present

## 2020-10-31 DIAGNOSIS — Z992 Dependence on renal dialysis: Secondary | ICD-10-CM | POA: Diagnosis not present

## 2020-10-31 DIAGNOSIS — D689 Coagulation defect, unspecified: Secondary | ICD-10-CM | POA: Diagnosis not present

## 2020-11-02 DIAGNOSIS — N186 End stage renal disease: Secondary | ICD-10-CM | POA: Diagnosis not present

## 2020-11-02 DIAGNOSIS — D689 Coagulation defect, unspecified: Secondary | ICD-10-CM | POA: Diagnosis not present

## 2020-11-02 DIAGNOSIS — Z992 Dependence on renal dialysis: Secondary | ICD-10-CM | POA: Diagnosis not present

## 2020-11-02 DIAGNOSIS — N2581 Secondary hyperparathyroidism of renal origin: Secondary | ICD-10-CM | POA: Diagnosis not present

## 2020-11-05 DIAGNOSIS — N186 End stage renal disease: Secondary | ICD-10-CM | POA: Diagnosis not present

## 2020-11-05 DIAGNOSIS — Z992 Dependence on renal dialysis: Secondary | ICD-10-CM | POA: Diagnosis not present

## 2020-11-05 DIAGNOSIS — D689 Coagulation defect, unspecified: Secondary | ICD-10-CM | POA: Diagnosis not present

## 2020-11-05 DIAGNOSIS — D631 Anemia in chronic kidney disease: Secondary | ICD-10-CM | POA: Diagnosis not present

## 2020-11-05 DIAGNOSIS — N2581 Secondary hyperparathyroidism of renal origin: Secondary | ICD-10-CM | POA: Diagnosis not present

## 2020-11-07 DIAGNOSIS — D689 Coagulation defect, unspecified: Secondary | ICD-10-CM | POA: Diagnosis not present

## 2020-11-07 DIAGNOSIS — N186 End stage renal disease: Secondary | ICD-10-CM | POA: Diagnosis not present

## 2020-11-07 DIAGNOSIS — Z992 Dependence on renal dialysis: Secondary | ICD-10-CM | POA: Diagnosis not present

## 2020-11-07 DIAGNOSIS — D631 Anemia in chronic kidney disease: Secondary | ICD-10-CM | POA: Diagnosis not present

## 2020-11-07 DIAGNOSIS — N2581 Secondary hyperparathyroidism of renal origin: Secondary | ICD-10-CM | POA: Diagnosis not present

## 2020-11-09 DIAGNOSIS — N2581 Secondary hyperparathyroidism of renal origin: Secondary | ICD-10-CM | POA: Diagnosis not present

## 2020-11-09 DIAGNOSIS — D631 Anemia in chronic kidney disease: Secondary | ICD-10-CM | POA: Diagnosis not present

## 2020-11-09 DIAGNOSIS — D689 Coagulation defect, unspecified: Secondary | ICD-10-CM | POA: Diagnosis not present

## 2020-11-09 DIAGNOSIS — N186 End stage renal disease: Secondary | ICD-10-CM | POA: Diagnosis not present

## 2020-11-09 DIAGNOSIS — Z992 Dependence on renal dialysis: Secondary | ICD-10-CM | POA: Diagnosis not present

## 2020-11-12 DIAGNOSIS — N2581 Secondary hyperparathyroidism of renal origin: Secondary | ICD-10-CM | POA: Diagnosis not present

## 2020-11-12 DIAGNOSIS — D689 Coagulation defect, unspecified: Secondary | ICD-10-CM | POA: Diagnosis not present

## 2020-11-12 DIAGNOSIS — D631 Anemia in chronic kidney disease: Secondary | ICD-10-CM | POA: Diagnosis not present

## 2020-11-12 DIAGNOSIS — N186 End stage renal disease: Secondary | ICD-10-CM | POA: Diagnosis not present

## 2020-11-12 DIAGNOSIS — Z992 Dependence on renal dialysis: Secondary | ICD-10-CM | POA: Diagnosis not present

## 2020-11-14 DIAGNOSIS — N2581 Secondary hyperparathyroidism of renal origin: Secondary | ICD-10-CM | POA: Diagnosis not present

## 2020-11-14 DIAGNOSIS — N186 End stage renal disease: Secondary | ICD-10-CM | POA: Diagnosis not present

## 2020-11-14 DIAGNOSIS — Z992 Dependence on renal dialysis: Secondary | ICD-10-CM | POA: Diagnosis not present

## 2020-11-14 DIAGNOSIS — D631 Anemia in chronic kidney disease: Secondary | ICD-10-CM | POA: Diagnosis not present

## 2020-11-14 DIAGNOSIS — D689 Coagulation defect, unspecified: Secondary | ICD-10-CM | POA: Diagnosis not present

## 2020-11-16 DIAGNOSIS — N2581 Secondary hyperparathyroidism of renal origin: Secondary | ICD-10-CM | POA: Diagnosis not present

## 2020-11-16 DIAGNOSIS — N186 End stage renal disease: Secondary | ICD-10-CM | POA: Diagnosis not present

## 2020-11-16 DIAGNOSIS — D631 Anemia in chronic kidney disease: Secondary | ICD-10-CM | POA: Diagnosis not present

## 2020-11-16 DIAGNOSIS — Z992 Dependence on renal dialysis: Secondary | ICD-10-CM | POA: Diagnosis not present

## 2020-11-16 DIAGNOSIS — D689 Coagulation defect, unspecified: Secondary | ICD-10-CM | POA: Diagnosis not present

## 2020-11-19 DIAGNOSIS — D689 Coagulation defect, unspecified: Secondary | ICD-10-CM | POA: Diagnosis not present

## 2020-11-19 DIAGNOSIS — N186 End stage renal disease: Secondary | ICD-10-CM | POA: Diagnosis not present

## 2020-11-19 DIAGNOSIS — N2581 Secondary hyperparathyroidism of renal origin: Secondary | ICD-10-CM | POA: Diagnosis not present

## 2020-11-19 DIAGNOSIS — Z992 Dependence on renal dialysis: Secondary | ICD-10-CM | POA: Diagnosis not present

## 2020-11-21 DIAGNOSIS — D689 Coagulation defect, unspecified: Secondary | ICD-10-CM | POA: Diagnosis not present

## 2020-11-21 DIAGNOSIS — N2581 Secondary hyperparathyroidism of renal origin: Secondary | ICD-10-CM | POA: Diagnosis not present

## 2020-11-21 DIAGNOSIS — N186 End stage renal disease: Secondary | ICD-10-CM | POA: Diagnosis not present

## 2020-11-21 DIAGNOSIS — Z992 Dependence on renal dialysis: Secondary | ICD-10-CM | POA: Diagnosis not present

## 2020-11-22 DIAGNOSIS — Z992 Dependence on renal dialysis: Secondary | ICD-10-CM | POA: Diagnosis not present

## 2020-11-22 DIAGNOSIS — N186 End stage renal disease: Secondary | ICD-10-CM | POA: Diagnosis not present

## 2020-11-22 DIAGNOSIS — D689 Coagulation defect, unspecified: Secondary | ICD-10-CM | POA: Diagnosis not present

## 2020-11-22 DIAGNOSIS — N2581 Secondary hyperparathyroidism of renal origin: Secondary | ICD-10-CM | POA: Diagnosis not present

## 2020-11-23 DIAGNOSIS — Z992 Dependence on renal dialysis: Secondary | ICD-10-CM | POA: Diagnosis not present

## 2020-11-23 DIAGNOSIS — D689 Coagulation defect, unspecified: Secondary | ICD-10-CM | POA: Diagnosis not present

## 2020-11-23 DIAGNOSIS — N2581 Secondary hyperparathyroidism of renal origin: Secondary | ICD-10-CM | POA: Diagnosis not present

## 2020-11-23 DIAGNOSIS — N186 End stage renal disease: Secondary | ICD-10-CM | POA: Diagnosis not present

## 2020-11-26 DIAGNOSIS — N2581 Secondary hyperparathyroidism of renal origin: Secondary | ICD-10-CM | POA: Diagnosis not present

## 2020-11-26 DIAGNOSIS — N186 End stage renal disease: Secondary | ICD-10-CM | POA: Diagnosis not present

## 2020-11-26 DIAGNOSIS — D631 Anemia in chronic kidney disease: Secondary | ICD-10-CM | POA: Diagnosis not present

## 2020-11-26 DIAGNOSIS — D689 Coagulation defect, unspecified: Secondary | ICD-10-CM | POA: Diagnosis not present

## 2020-11-26 DIAGNOSIS — Z992 Dependence on renal dialysis: Secondary | ICD-10-CM | POA: Diagnosis not present

## 2020-11-27 DIAGNOSIS — H6123 Impacted cerumen, bilateral: Secondary | ICD-10-CM | POA: Diagnosis not present

## 2020-11-28 DIAGNOSIS — N186 End stage renal disease: Secondary | ICD-10-CM | POA: Diagnosis not present

## 2020-11-28 DIAGNOSIS — N2581 Secondary hyperparathyroidism of renal origin: Secondary | ICD-10-CM | POA: Diagnosis not present

## 2020-11-28 DIAGNOSIS — D631 Anemia in chronic kidney disease: Secondary | ICD-10-CM | POA: Diagnosis not present

## 2020-11-28 DIAGNOSIS — Z992 Dependence on renal dialysis: Secondary | ICD-10-CM | POA: Diagnosis not present

## 2020-11-28 DIAGNOSIS — D689 Coagulation defect, unspecified: Secondary | ICD-10-CM | POA: Diagnosis not present

## 2020-11-29 DIAGNOSIS — Z992 Dependence on renal dialysis: Secondary | ICD-10-CM | POA: Diagnosis not present

## 2020-11-29 DIAGNOSIS — I129 Hypertensive chronic kidney disease with stage 1 through stage 4 chronic kidney disease, or unspecified chronic kidney disease: Secondary | ICD-10-CM | POA: Diagnosis not present

## 2020-11-29 DIAGNOSIS — N186 End stage renal disease: Secondary | ICD-10-CM | POA: Diagnosis not present

## 2020-12-03 DIAGNOSIS — D631 Anemia in chronic kidney disease: Secondary | ICD-10-CM | POA: Diagnosis not present

## 2020-12-03 DIAGNOSIS — E1122 Type 2 diabetes mellitus with diabetic chronic kidney disease: Secondary | ICD-10-CM | POA: Diagnosis not present

## 2020-12-03 DIAGNOSIS — N186 End stage renal disease: Secondary | ICD-10-CM | POA: Diagnosis not present

## 2020-12-03 DIAGNOSIS — N2581 Secondary hyperparathyroidism of renal origin: Secondary | ICD-10-CM | POA: Diagnosis not present

## 2020-12-03 DIAGNOSIS — D689 Coagulation defect, unspecified: Secondary | ICD-10-CM | POA: Diagnosis not present

## 2020-12-03 DIAGNOSIS — Z992 Dependence on renal dialysis: Secondary | ICD-10-CM | POA: Diagnosis not present

## 2020-12-05 DIAGNOSIS — D631 Anemia in chronic kidney disease: Secondary | ICD-10-CM | POA: Diagnosis not present

## 2020-12-05 DIAGNOSIS — N186 End stage renal disease: Secondary | ICD-10-CM | POA: Diagnosis not present

## 2020-12-05 DIAGNOSIS — D689 Coagulation defect, unspecified: Secondary | ICD-10-CM | POA: Diagnosis not present

## 2020-12-05 DIAGNOSIS — N2581 Secondary hyperparathyroidism of renal origin: Secondary | ICD-10-CM | POA: Diagnosis not present

## 2020-12-05 DIAGNOSIS — E1122 Type 2 diabetes mellitus with diabetic chronic kidney disease: Secondary | ICD-10-CM | POA: Diagnosis not present

## 2020-12-05 DIAGNOSIS — Z992 Dependence on renal dialysis: Secondary | ICD-10-CM | POA: Diagnosis not present

## 2020-12-07 DIAGNOSIS — N186 End stage renal disease: Secondary | ICD-10-CM | POA: Diagnosis not present

## 2020-12-07 DIAGNOSIS — Z992 Dependence on renal dialysis: Secondary | ICD-10-CM | POA: Diagnosis not present

## 2020-12-07 DIAGNOSIS — D631 Anemia in chronic kidney disease: Secondary | ICD-10-CM | POA: Diagnosis not present

## 2020-12-07 DIAGNOSIS — N2581 Secondary hyperparathyroidism of renal origin: Secondary | ICD-10-CM | POA: Diagnosis not present

## 2020-12-07 DIAGNOSIS — D689 Coagulation defect, unspecified: Secondary | ICD-10-CM | POA: Diagnosis not present

## 2020-12-07 DIAGNOSIS — E1122 Type 2 diabetes mellitus with diabetic chronic kidney disease: Secondary | ICD-10-CM | POA: Diagnosis not present

## 2020-12-10 DIAGNOSIS — D631 Anemia in chronic kidney disease: Secondary | ICD-10-CM | POA: Diagnosis not present

## 2020-12-10 DIAGNOSIS — Z992 Dependence on renal dialysis: Secondary | ICD-10-CM | POA: Diagnosis not present

## 2020-12-10 DIAGNOSIS — N186 End stage renal disease: Secondary | ICD-10-CM | POA: Diagnosis not present

## 2020-12-10 DIAGNOSIS — E1122 Type 2 diabetes mellitus with diabetic chronic kidney disease: Secondary | ICD-10-CM | POA: Diagnosis not present

## 2020-12-10 DIAGNOSIS — D689 Coagulation defect, unspecified: Secondary | ICD-10-CM | POA: Diagnosis not present

## 2020-12-10 DIAGNOSIS — N2581 Secondary hyperparathyroidism of renal origin: Secondary | ICD-10-CM | POA: Diagnosis not present

## 2020-12-12 DIAGNOSIS — D631 Anemia in chronic kidney disease: Secondary | ICD-10-CM | POA: Diagnosis not present

## 2020-12-12 DIAGNOSIS — N2581 Secondary hyperparathyroidism of renal origin: Secondary | ICD-10-CM | POA: Diagnosis not present

## 2020-12-12 DIAGNOSIS — N186 End stage renal disease: Secondary | ICD-10-CM | POA: Diagnosis not present

## 2020-12-12 DIAGNOSIS — Z992 Dependence on renal dialysis: Secondary | ICD-10-CM | POA: Diagnosis not present

## 2020-12-12 DIAGNOSIS — D689 Coagulation defect, unspecified: Secondary | ICD-10-CM | POA: Diagnosis not present

## 2020-12-12 DIAGNOSIS — E1122 Type 2 diabetes mellitus with diabetic chronic kidney disease: Secondary | ICD-10-CM | POA: Diagnosis not present

## 2020-12-13 DIAGNOSIS — E1122 Type 2 diabetes mellitus with diabetic chronic kidney disease: Secondary | ICD-10-CM | POA: Diagnosis not present

## 2020-12-13 DIAGNOSIS — Z794 Long term (current) use of insulin: Secondary | ICD-10-CM | POA: Diagnosis not present

## 2020-12-13 DIAGNOSIS — E7800 Pure hypercholesterolemia, unspecified: Secondary | ICD-10-CM | POA: Diagnosis not present

## 2020-12-14 DIAGNOSIS — D689 Coagulation defect, unspecified: Secondary | ICD-10-CM | POA: Diagnosis not present

## 2020-12-14 DIAGNOSIS — Z992 Dependence on renal dialysis: Secondary | ICD-10-CM | POA: Diagnosis not present

## 2020-12-14 DIAGNOSIS — N186 End stage renal disease: Secondary | ICD-10-CM | POA: Diagnosis not present

## 2020-12-14 DIAGNOSIS — E1122 Type 2 diabetes mellitus with diabetic chronic kidney disease: Secondary | ICD-10-CM | POA: Diagnosis not present

## 2020-12-14 DIAGNOSIS — N2581 Secondary hyperparathyroidism of renal origin: Secondary | ICD-10-CM | POA: Diagnosis not present

## 2020-12-14 DIAGNOSIS — D631 Anemia in chronic kidney disease: Secondary | ICD-10-CM | POA: Diagnosis not present

## 2020-12-17 DIAGNOSIS — D689 Coagulation defect, unspecified: Secondary | ICD-10-CM | POA: Diagnosis not present

## 2020-12-17 DIAGNOSIS — N186 End stage renal disease: Secondary | ICD-10-CM | POA: Diagnosis not present

## 2020-12-17 DIAGNOSIS — E1122 Type 2 diabetes mellitus with diabetic chronic kidney disease: Secondary | ICD-10-CM | POA: Diagnosis not present

## 2020-12-17 DIAGNOSIS — D631 Anemia in chronic kidney disease: Secondary | ICD-10-CM | POA: Diagnosis not present

## 2020-12-17 DIAGNOSIS — N2581 Secondary hyperparathyroidism of renal origin: Secondary | ICD-10-CM | POA: Diagnosis not present

## 2020-12-17 DIAGNOSIS — Z992 Dependence on renal dialysis: Secondary | ICD-10-CM | POA: Diagnosis not present

## 2020-12-19 DIAGNOSIS — N186 End stage renal disease: Secondary | ICD-10-CM | POA: Diagnosis not present

## 2020-12-19 DIAGNOSIS — E1122 Type 2 diabetes mellitus with diabetic chronic kidney disease: Secondary | ICD-10-CM | POA: Diagnosis not present

## 2020-12-19 DIAGNOSIS — N2581 Secondary hyperparathyroidism of renal origin: Secondary | ICD-10-CM | POA: Diagnosis not present

## 2020-12-19 DIAGNOSIS — D689 Coagulation defect, unspecified: Secondary | ICD-10-CM | POA: Diagnosis not present

## 2020-12-19 DIAGNOSIS — Z992 Dependence on renal dialysis: Secondary | ICD-10-CM | POA: Diagnosis not present

## 2020-12-19 DIAGNOSIS — D631 Anemia in chronic kidney disease: Secondary | ICD-10-CM | POA: Diagnosis not present

## 2020-12-21 DIAGNOSIS — D689 Coagulation defect, unspecified: Secondary | ICD-10-CM | POA: Diagnosis not present

## 2020-12-21 DIAGNOSIS — N186 End stage renal disease: Secondary | ICD-10-CM | POA: Diagnosis not present

## 2020-12-21 DIAGNOSIS — N2581 Secondary hyperparathyroidism of renal origin: Secondary | ICD-10-CM | POA: Diagnosis not present

## 2020-12-21 DIAGNOSIS — D631 Anemia in chronic kidney disease: Secondary | ICD-10-CM | POA: Diagnosis not present

## 2020-12-21 DIAGNOSIS — Z992 Dependence on renal dialysis: Secondary | ICD-10-CM | POA: Diagnosis not present

## 2020-12-21 DIAGNOSIS — E1122 Type 2 diabetes mellitus with diabetic chronic kidney disease: Secondary | ICD-10-CM | POA: Diagnosis not present

## 2020-12-24 DIAGNOSIS — N186 End stage renal disease: Secondary | ICD-10-CM | POA: Diagnosis not present

## 2020-12-24 DIAGNOSIS — Z992 Dependence on renal dialysis: Secondary | ICD-10-CM | POA: Diagnosis not present

## 2020-12-24 DIAGNOSIS — N2581 Secondary hyperparathyroidism of renal origin: Secondary | ICD-10-CM | POA: Diagnosis not present

## 2020-12-24 DIAGNOSIS — E1122 Type 2 diabetes mellitus with diabetic chronic kidney disease: Secondary | ICD-10-CM | POA: Diagnosis not present

## 2020-12-24 DIAGNOSIS — D631 Anemia in chronic kidney disease: Secondary | ICD-10-CM | POA: Diagnosis not present

## 2020-12-24 DIAGNOSIS — D689 Coagulation defect, unspecified: Secondary | ICD-10-CM | POA: Diagnosis not present

## 2020-12-26 DIAGNOSIS — E1122 Type 2 diabetes mellitus with diabetic chronic kidney disease: Secondary | ICD-10-CM | POA: Diagnosis not present

## 2020-12-26 DIAGNOSIS — D631 Anemia in chronic kidney disease: Secondary | ICD-10-CM | POA: Diagnosis not present

## 2020-12-26 DIAGNOSIS — D689 Coagulation defect, unspecified: Secondary | ICD-10-CM | POA: Diagnosis not present

## 2020-12-26 DIAGNOSIS — N2581 Secondary hyperparathyroidism of renal origin: Secondary | ICD-10-CM | POA: Diagnosis not present

## 2020-12-26 DIAGNOSIS — Z992 Dependence on renal dialysis: Secondary | ICD-10-CM | POA: Diagnosis not present

## 2020-12-26 DIAGNOSIS — N186 End stage renal disease: Secondary | ICD-10-CM | POA: Diagnosis not present

## 2020-12-28 DIAGNOSIS — Z992 Dependence on renal dialysis: Secondary | ICD-10-CM | POA: Diagnosis not present

## 2020-12-28 DIAGNOSIS — D631 Anemia in chronic kidney disease: Secondary | ICD-10-CM | POA: Diagnosis not present

## 2020-12-28 DIAGNOSIS — N2581 Secondary hyperparathyroidism of renal origin: Secondary | ICD-10-CM | POA: Diagnosis not present

## 2020-12-28 DIAGNOSIS — N186 End stage renal disease: Secondary | ICD-10-CM | POA: Diagnosis not present

## 2020-12-28 DIAGNOSIS — D689 Coagulation defect, unspecified: Secondary | ICD-10-CM | POA: Diagnosis not present

## 2020-12-28 DIAGNOSIS — E1122 Type 2 diabetes mellitus with diabetic chronic kidney disease: Secondary | ICD-10-CM | POA: Diagnosis not present

## 2020-12-30 DIAGNOSIS — N186 End stage renal disease: Secondary | ICD-10-CM | POA: Diagnosis not present

## 2020-12-30 DIAGNOSIS — I129 Hypertensive chronic kidney disease with stage 1 through stage 4 chronic kidney disease, or unspecified chronic kidney disease: Secondary | ICD-10-CM | POA: Diagnosis not present

## 2020-12-30 DIAGNOSIS — Z992 Dependence on renal dialysis: Secondary | ICD-10-CM | POA: Diagnosis not present

## 2020-12-31 DIAGNOSIS — N186 End stage renal disease: Secondary | ICD-10-CM | POA: Diagnosis not present

## 2020-12-31 DIAGNOSIS — R52 Pain, unspecified: Secondary | ICD-10-CM | POA: Diagnosis not present

## 2020-12-31 DIAGNOSIS — D689 Coagulation defect, unspecified: Secondary | ICD-10-CM | POA: Diagnosis not present

## 2020-12-31 DIAGNOSIS — N2581 Secondary hyperparathyroidism of renal origin: Secondary | ICD-10-CM | POA: Diagnosis not present

## 2020-12-31 DIAGNOSIS — D509 Iron deficiency anemia, unspecified: Secondary | ICD-10-CM | POA: Diagnosis not present

## 2020-12-31 DIAGNOSIS — D631 Anemia in chronic kidney disease: Secondary | ICD-10-CM | POA: Diagnosis not present

## 2020-12-31 DIAGNOSIS — Z992 Dependence on renal dialysis: Secondary | ICD-10-CM | POA: Diagnosis not present

## 2021-01-02 DIAGNOSIS — D689 Coagulation defect, unspecified: Secondary | ICD-10-CM | POA: Diagnosis not present

## 2021-01-02 DIAGNOSIS — D631 Anemia in chronic kidney disease: Secondary | ICD-10-CM | POA: Diagnosis not present

## 2021-01-02 DIAGNOSIS — D509 Iron deficiency anemia, unspecified: Secondary | ICD-10-CM | POA: Diagnosis not present

## 2021-01-02 DIAGNOSIS — N186 End stage renal disease: Secondary | ICD-10-CM | POA: Diagnosis not present

## 2021-01-02 DIAGNOSIS — R52 Pain, unspecified: Secondary | ICD-10-CM | POA: Diagnosis not present

## 2021-01-02 DIAGNOSIS — N2581 Secondary hyperparathyroidism of renal origin: Secondary | ICD-10-CM | POA: Diagnosis not present

## 2021-01-02 DIAGNOSIS — Z992 Dependence on renal dialysis: Secondary | ICD-10-CM | POA: Diagnosis not present

## 2021-01-04 DIAGNOSIS — N186 End stage renal disease: Secondary | ICD-10-CM | POA: Diagnosis not present

## 2021-01-04 DIAGNOSIS — Z992 Dependence on renal dialysis: Secondary | ICD-10-CM | POA: Diagnosis not present

## 2021-01-04 DIAGNOSIS — R52 Pain, unspecified: Secondary | ICD-10-CM | POA: Diagnosis not present

## 2021-01-04 DIAGNOSIS — D509 Iron deficiency anemia, unspecified: Secondary | ICD-10-CM | POA: Diagnosis not present

## 2021-01-04 DIAGNOSIS — D689 Coagulation defect, unspecified: Secondary | ICD-10-CM | POA: Diagnosis not present

## 2021-01-04 DIAGNOSIS — N2581 Secondary hyperparathyroidism of renal origin: Secondary | ICD-10-CM | POA: Diagnosis not present

## 2021-01-04 DIAGNOSIS — D631 Anemia in chronic kidney disease: Secondary | ICD-10-CM | POA: Diagnosis not present

## 2021-01-07 DIAGNOSIS — N186 End stage renal disease: Secondary | ICD-10-CM | POA: Diagnosis not present

## 2021-01-07 DIAGNOSIS — D509 Iron deficiency anemia, unspecified: Secondary | ICD-10-CM | POA: Diagnosis not present

## 2021-01-07 DIAGNOSIS — D631 Anemia in chronic kidney disease: Secondary | ICD-10-CM | POA: Diagnosis not present

## 2021-01-07 DIAGNOSIS — R52 Pain, unspecified: Secondary | ICD-10-CM | POA: Diagnosis not present

## 2021-01-07 DIAGNOSIS — Z992 Dependence on renal dialysis: Secondary | ICD-10-CM | POA: Diagnosis not present

## 2021-01-07 DIAGNOSIS — N2581 Secondary hyperparathyroidism of renal origin: Secondary | ICD-10-CM | POA: Diagnosis not present

## 2021-01-07 DIAGNOSIS — D689 Coagulation defect, unspecified: Secondary | ICD-10-CM | POA: Diagnosis not present

## 2021-01-09 DIAGNOSIS — N2581 Secondary hyperparathyroidism of renal origin: Secondary | ICD-10-CM | POA: Diagnosis not present

## 2021-01-09 DIAGNOSIS — Z992 Dependence on renal dialysis: Secondary | ICD-10-CM | POA: Diagnosis not present

## 2021-01-09 DIAGNOSIS — R52 Pain, unspecified: Secondary | ICD-10-CM | POA: Diagnosis not present

## 2021-01-09 DIAGNOSIS — D509 Iron deficiency anemia, unspecified: Secondary | ICD-10-CM | POA: Diagnosis not present

## 2021-01-09 DIAGNOSIS — N186 End stage renal disease: Secondary | ICD-10-CM | POA: Diagnosis not present

## 2021-01-09 DIAGNOSIS — D689 Coagulation defect, unspecified: Secondary | ICD-10-CM | POA: Diagnosis not present

## 2021-01-09 DIAGNOSIS — D631 Anemia in chronic kidney disease: Secondary | ICD-10-CM | POA: Diagnosis not present

## 2021-01-11 DIAGNOSIS — D631 Anemia in chronic kidney disease: Secondary | ICD-10-CM | POA: Diagnosis not present

## 2021-01-11 DIAGNOSIS — D689 Coagulation defect, unspecified: Secondary | ICD-10-CM | POA: Diagnosis not present

## 2021-01-11 DIAGNOSIS — N186 End stage renal disease: Secondary | ICD-10-CM | POA: Diagnosis not present

## 2021-01-11 DIAGNOSIS — N2581 Secondary hyperparathyroidism of renal origin: Secondary | ICD-10-CM | POA: Diagnosis not present

## 2021-01-11 DIAGNOSIS — D509 Iron deficiency anemia, unspecified: Secondary | ICD-10-CM | POA: Diagnosis not present

## 2021-01-11 DIAGNOSIS — R52 Pain, unspecified: Secondary | ICD-10-CM | POA: Diagnosis not present

## 2021-01-11 DIAGNOSIS — Z992 Dependence on renal dialysis: Secondary | ICD-10-CM | POA: Diagnosis not present

## 2021-01-14 DIAGNOSIS — N2581 Secondary hyperparathyroidism of renal origin: Secondary | ICD-10-CM | POA: Diagnosis not present

## 2021-01-14 DIAGNOSIS — D509 Iron deficiency anemia, unspecified: Secondary | ICD-10-CM | POA: Diagnosis not present

## 2021-01-14 DIAGNOSIS — Z992 Dependence on renal dialysis: Secondary | ICD-10-CM | POA: Diagnosis not present

## 2021-01-14 DIAGNOSIS — D631 Anemia in chronic kidney disease: Secondary | ICD-10-CM | POA: Diagnosis not present

## 2021-01-14 DIAGNOSIS — N186 End stage renal disease: Secondary | ICD-10-CM | POA: Diagnosis not present

## 2021-01-14 DIAGNOSIS — R52 Pain, unspecified: Secondary | ICD-10-CM | POA: Diagnosis not present

## 2021-01-14 DIAGNOSIS — D689 Coagulation defect, unspecified: Secondary | ICD-10-CM | POA: Diagnosis not present

## 2021-01-16 DIAGNOSIS — N2581 Secondary hyperparathyroidism of renal origin: Secondary | ICD-10-CM | POA: Diagnosis not present

## 2021-01-16 DIAGNOSIS — N186 End stage renal disease: Secondary | ICD-10-CM | POA: Diagnosis not present

## 2021-01-16 DIAGNOSIS — Z992 Dependence on renal dialysis: Secondary | ICD-10-CM | POA: Diagnosis not present

## 2021-01-16 DIAGNOSIS — D631 Anemia in chronic kidney disease: Secondary | ICD-10-CM | POA: Diagnosis not present

## 2021-01-16 DIAGNOSIS — R52 Pain, unspecified: Secondary | ICD-10-CM | POA: Diagnosis not present

## 2021-01-16 DIAGNOSIS — D509 Iron deficiency anemia, unspecified: Secondary | ICD-10-CM | POA: Diagnosis not present

## 2021-01-16 DIAGNOSIS — D689 Coagulation defect, unspecified: Secondary | ICD-10-CM | POA: Diagnosis not present

## 2021-01-17 DIAGNOSIS — I5032 Chronic diastolic (congestive) heart failure: Secondary | ICD-10-CM | POA: Diagnosis not present

## 2021-01-17 DIAGNOSIS — E1122 Type 2 diabetes mellitus with diabetic chronic kidney disease: Secondary | ICD-10-CM | POA: Diagnosis not present

## 2021-01-17 DIAGNOSIS — M170 Bilateral primary osteoarthritis of knee: Secondary | ICD-10-CM | POA: Diagnosis not present

## 2021-01-17 DIAGNOSIS — R296 Repeated falls: Secondary | ICD-10-CM | POA: Diagnosis not present

## 2021-01-17 DIAGNOSIS — N186 End stage renal disease: Secondary | ICD-10-CM | POA: Diagnosis not present

## 2021-01-18 DIAGNOSIS — R52 Pain, unspecified: Secondary | ICD-10-CM | POA: Diagnosis not present

## 2021-01-18 DIAGNOSIS — D689 Coagulation defect, unspecified: Secondary | ICD-10-CM | POA: Diagnosis not present

## 2021-01-18 DIAGNOSIS — D509 Iron deficiency anemia, unspecified: Secondary | ICD-10-CM | POA: Diagnosis not present

## 2021-01-18 DIAGNOSIS — N186 End stage renal disease: Secondary | ICD-10-CM | POA: Diagnosis not present

## 2021-01-18 DIAGNOSIS — Z992 Dependence on renal dialysis: Secondary | ICD-10-CM | POA: Diagnosis not present

## 2021-01-18 DIAGNOSIS — D631 Anemia in chronic kidney disease: Secondary | ICD-10-CM | POA: Diagnosis not present

## 2021-01-18 DIAGNOSIS — N2581 Secondary hyperparathyroidism of renal origin: Secondary | ICD-10-CM | POA: Diagnosis not present

## 2021-01-21 DIAGNOSIS — N2581 Secondary hyperparathyroidism of renal origin: Secondary | ICD-10-CM | POA: Diagnosis not present

## 2021-01-21 DIAGNOSIS — D509 Iron deficiency anemia, unspecified: Secondary | ICD-10-CM | POA: Diagnosis not present

## 2021-01-21 DIAGNOSIS — D631 Anemia in chronic kidney disease: Secondary | ICD-10-CM | POA: Diagnosis not present

## 2021-01-21 DIAGNOSIS — N186 End stage renal disease: Secondary | ICD-10-CM | POA: Diagnosis not present

## 2021-01-21 DIAGNOSIS — R52 Pain, unspecified: Secondary | ICD-10-CM | POA: Diagnosis not present

## 2021-01-21 DIAGNOSIS — Z992 Dependence on renal dialysis: Secondary | ICD-10-CM | POA: Diagnosis not present

## 2021-01-21 DIAGNOSIS — D689 Coagulation defect, unspecified: Secondary | ICD-10-CM | POA: Diagnosis not present

## 2021-01-23 DIAGNOSIS — N186 End stage renal disease: Secondary | ICD-10-CM | POA: Diagnosis not present

## 2021-01-23 DIAGNOSIS — D689 Coagulation defect, unspecified: Secondary | ICD-10-CM | POA: Diagnosis not present

## 2021-01-23 DIAGNOSIS — D509 Iron deficiency anemia, unspecified: Secondary | ICD-10-CM | POA: Diagnosis not present

## 2021-01-23 DIAGNOSIS — R52 Pain, unspecified: Secondary | ICD-10-CM | POA: Diagnosis not present

## 2021-01-23 DIAGNOSIS — D631 Anemia in chronic kidney disease: Secondary | ICD-10-CM | POA: Diagnosis not present

## 2021-01-23 DIAGNOSIS — N2581 Secondary hyperparathyroidism of renal origin: Secondary | ICD-10-CM | POA: Diagnosis not present

## 2021-01-23 DIAGNOSIS — Z992 Dependence on renal dialysis: Secondary | ICD-10-CM | POA: Diagnosis not present

## 2021-01-25 DIAGNOSIS — N186 End stage renal disease: Secondary | ICD-10-CM | POA: Diagnosis not present

## 2021-01-25 DIAGNOSIS — Z992 Dependence on renal dialysis: Secondary | ICD-10-CM | POA: Diagnosis not present

## 2021-01-25 DIAGNOSIS — D509 Iron deficiency anemia, unspecified: Secondary | ICD-10-CM | POA: Diagnosis not present

## 2021-01-25 DIAGNOSIS — R52 Pain, unspecified: Secondary | ICD-10-CM | POA: Diagnosis not present

## 2021-01-25 DIAGNOSIS — N2581 Secondary hyperparathyroidism of renal origin: Secondary | ICD-10-CM | POA: Diagnosis not present

## 2021-01-25 DIAGNOSIS — D631 Anemia in chronic kidney disease: Secondary | ICD-10-CM | POA: Diagnosis not present

## 2021-01-25 DIAGNOSIS — D689 Coagulation defect, unspecified: Secondary | ICD-10-CM | POA: Diagnosis not present

## 2021-01-28 DIAGNOSIS — N2581 Secondary hyperparathyroidism of renal origin: Secondary | ICD-10-CM | POA: Diagnosis not present

## 2021-01-28 DIAGNOSIS — Z992 Dependence on renal dialysis: Secondary | ICD-10-CM | POA: Diagnosis not present

## 2021-01-28 DIAGNOSIS — N186 End stage renal disease: Secondary | ICD-10-CM | POA: Diagnosis not present

## 2021-01-28 DIAGNOSIS — D631 Anemia in chronic kidney disease: Secondary | ICD-10-CM | POA: Diagnosis not present

## 2021-01-28 DIAGNOSIS — D509 Iron deficiency anemia, unspecified: Secondary | ICD-10-CM | POA: Diagnosis not present

## 2021-01-28 DIAGNOSIS — R52 Pain, unspecified: Secondary | ICD-10-CM | POA: Diagnosis not present

## 2021-01-28 DIAGNOSIS — D689 Coagulation defect, unspecified: Secondary | ICD-10-CM | POA: Diagnosis not present

## 2021-01-30 DIAGNOSIS — D689 Coagulation defect, unspecified: Secondary | ICD-10-CM | POA: Diagnosis not present

## 2021-01-30 DIAGNOSIS — R52 Pain, unspecified: Secondary | ICD-10-CM | POA: Diagnosis not present

## 2021-01-30 DIAGNOSIS — N186 End stage renal disease: Secondary | ICD-10-CM | POA: Diagnosis not present

## 2021-01-30 DIAGNOSIS — D509 Iron deficiency anemia, unspecified: Secondary | ICD-10-CM | POA: Diagnosis not present

## 2021-01-30 DIAGNOSIS — I129 Hypertensive chronic kidney disease with stage 1 through stage 4 chronic kidney disease, or unspecified chronic kidney disease: Secondary | ICD-10-CM | POA: Diagnosis not present

## 2021-01-30 DIAGNOSIS — D631 Anemia in chronic kidney disease: Secondary | ICD-10-CM | POA: Diagnosis not present

## 2021-01-30 DIAGNOSIS — Z992 Dependence on renal dialysis: Secondary | ICD-10-CM | POA: Diagnosis not present

## 2021-01-30 DIAGNOSIS — N2581 Secondary hyperparathyroidism of renal origin: Secondary | ICD-10-CM | POA: Diagnosis not present

## 2021-02-01 DIAGNOSIS — N2581 Secondary hyperparathyroidism of renal origin: Secondary | ICD-10-CM | POA: Diagnosis not present

## 2021-02-01 DIAGNOSIS — D689 Coagulation defect, unspecified: Secondary | ICD-10-CM | POA: Diagnosis not present

## 2021-02-01 DIAGNOSIS — Z992 Dependence on renal dialysis: Secondary | ICD-10-CM | POA: Diagnosis not present

## 2021-02-01 DIAGNOSIS — N186 End stage renal disease: Secondary | ICD-10-CM | POA: Diagnosis not present

## 2021-02-04 DIAGNOSIS — N2581 Secondary hyperparathyroidism of renal origin: Secondary | ICD-10-CM | POA: Diagnosis not present

## 2021-02-04 DIAGNOSIS — N186 End stage renal disease: Secondary | ICD-10-CM | POA: Diagnosis not present

## 2021-02-04 DIAGNOSIS — Z992 Dependence on renal dialysis: Secondary | ICD-10-CM | POA: Diagnosis not present

## 2021-02-04 DIAGNOSIS — D689 Coagulation defect, unspecified: Secondary | ICD-10-CM | POA: Diagnosis not present

## 2021-02-06 DIAGNOSIS — N186 End stage renal disease: Secondary | ICD-10-CM | POA: Diagnosis not present

## 2021-02-06 DIAGNOSIS — N2581 Secondary hyperparathyroidism of renal origin: Secondary | ICD-10-CM | POA: Diagnosis not present

## 2021-02-06 DIAGNOSIS — D689 Coagulation defect, unspecified: Secondary | ICD-10-CM | POA: Diagnosis not present

## 2021-02-06 DIAGNOSIS — Z992 Dependence on renal dialysis: Secondary | ICD-10-CM | POA: Diagnosis not present

## 2021-02-08 DIAGNOSIS — D689 Coagulation defect, unspecified: Secondary | ICD-10-CM | POA: Diagnosis not present

## 2021-02-08 DIAGNOSIS — N186 End stage renal disease: Secondary | ICD-10-CM | POA: Diagnosis not present

## 2021-02-08 DIAGNOSIS — N2581 Secondary hyperparathyroidism of renal origin: Secondary | ICD-10-CM | POA: Diagnosis not present

## 2021-02-08 DIAGNOSIS — Z992 Dependence on renal dialysis: Secondary | ICD-10-CM | POA: Diagnosis not present

## 2021-02-11 DIAGNOSIS — D689 Coagulation defect, unspecified: Secondary | ICD-10-CM | POA: Diagnosis not present

## 2021-02-11 DIAGNOSIS — Z992 Dependence on renal dialysis: Secondary | ICD-10-CM | POA: Diagnosis not present

## 2021-02-11 DIAGNOSIS — N2581 Secondary hyperparathyroidism of renal origin: Secondary | ICD-10-CM | POA: Diagnosis not present

## 2021-02-11 DIAGNOSIS — N186 End stage renal disease: Secondary | ICD-10-CM | POA: Diagnosis not present

## 2021-02-12 DIAGNOSIS — E7800 Pure hypercholesterolemia, unspecified: Secondary | ICD-10-CM | POA: Diagnosis not present

## 2021-02-12 DIAGNOSIS — Z794 Long term (current) use of insulin: Secondary | ICD-10-CM | POA: Diagnosis not present

## 2021-02-12 DIAGNOSIS — E1122 Type 2 diabetes mellitus with diabetic chronic kidney disease: Secondary | ICD-10-CM | POA: Diagnosis not present

## 2021-02-13 DIAGNOSIS — D689 Coagulation defect, unspecified: Secondary | ICD-10-CM | POA: Diagnosis not present

## 2021-02-13 DIAGNOSIS — Z992 Dependence on renal dialysis: Secondary | ICD-10-CM | POA: Diagnosis not present

## 2021-02-13 DIAGNOSIS — N2581 Secondary hyperparathyroidism of renal origin: Secondary | ICD-10-CM | POA: Diagnosis not present

## 2021-02-13 DIAGNOSIS — N186 End stage renal disease: Secondary | ICD-10-CM | POA: Diagnosis not present

## 2021-02-15 DIAGNOSIS — D689 Coagulation defect, unspecified: Secondary | ICD-10-CM | POA: Diagnosis not present

## 2021-02-15 DIAGNOSIS — N186 End stage renal disease: Secondary | ICD-10-CM | POA: Diagnosis not present

## 2021-02-15 DIAGNOSIS — Z992 Dependence on renal dialysis: Secondary | ICD-10-CM | POA: Diagnosis not present

## 2021-02-15 DIAGNOSIS — N2581 Secondary hyperparathyroidism of renal origin: Secondary | ICD-10-CM | POA: Diagnosis not present

## 2021-02-17 DIAGNOSIS — N186 End stage renal disease: Secondary | ICD-10-CM | POA: Diagnosis not present

## 2021-02-17 DIAGNOSIS — E1122 Type 2 diabetes mellitus with diabetic chronic kidney disease: Secondary | ICD-10-CM | POA: Diagnosis not present

## 2021-02-17 DIAGNOSIS — M170 Bilateral primary osteoarthritis of knee: Secondary | ICD-10-CM | POA: Diagnosis not present

## 2021-02-17 DIAGNOSIS — I5032 Chronic diastolic (congestive) heart failure: Secondary | ICD-10-CM | POA: Diagnosis not present

## 2021-02-17 DIAGNOSIS — R296 Repeated falls: Secondary | ICD-10-CM | POA: Diagnosis not present

## 2021-02-18 DIAGNOSIS — Z992 Dependence on renal dialysis: Secondary | ICD-10-CM | POA: Diagnosis not present

## 2021-02-18 DIAGNOSIS — N2581 Secondary hyperparathyroidism of renal origin: Secondary | ICD-10-CM | POA: Diagnosis not present

## 2021-02-18 DIAGNOSIS — N186 End stage renal disease: Secondary | ICD-10-CM | POA: Diagnosis not present

## 2021-02-18 DIAGNOSIS — D689 Coagulation defect, unspecified: Secondary | ICD-10-CM | POA: Diagnosis not present

## 2021-02-20 DIAGNOSIS — D689 Coagulation defect, unspecified: Secondary | ICD-10-CM | POA: Diagnosis not present

## 2021-02-20 DIAGNOSIS — N2581 Secondary hyperparathyroidism of renal origin: Secondary | ICD-10-CM | POA: Diagnosis not present

## 2021-02-20 DIAGNOSIS — Z992 Dependence on renal dialysis: Secondary | ICD-10-CM | POA: Diagnosis not present

## 2021-02-20 DIAGNOSIS — N186 End stage renal disease: Secondary | ICD-10-CM | POA: Diagnosis not present

## 2021-02-22 DIAGNOSIS — N186 End stage renal disease: Secondary | ICD-10-CM | POA: Diagnosis not present

## 2021-02-22 DIAGNOSIS — D689 Coagulation defect, unspecified: Secondary | ICD-10-CM | POA: Diagnosis not present

## 2021-02-22 DIAGNOSIS — Z992 Dependence on renal dialysis: Secondary | ICD-10-CM | POA: Diagnosis not present

## 2021-02-22 DIAGNOSIS — N2581 Secondary hyperparathyroidism of renal origin: Secondary | ICD-10-CM | POA: Diagnosis not present

## 2021-02-25 DIAGNOSIS — N186 End stage renal disease: Secondary | ICD-10-CM | POA: Diagnosis not present

## 2021-02-25 DIAGNOSIS — D689 Coagulation defect, unspecified: Secondary | ICD-10-CM | POA: Diagnosis not present

## 2021-02-25 DIAGNOSIS — Z992 Dependence on renal dialysis: Secondary | ICD-10-CM | POA: Diagnosis not present

## 2021-02-25 DIAGNOSIS — N2581 Secondary hyperparathyroidism of renal origin: Secondary | ICD-10-CM | POA: Diagnosis not present

## 2021-02-27 DIAGNOSIS — N186 End stage renal disease: Secondary | ICD-10-CM | POA: Diagnosis not present

## 2021-02-27 DIAGNOSIS — N2581 Secondary hyperparathyroidism of renal origin: Secondary | ICD-10-CM | POA: Diagnosis not present

## 2021-02-27 DIAGNOSIS — D689 Coagulation defect, unspecified: Secondary | ICD-10-CM | POA: Diagnosis not present

## 2021-02-27 DIAGNOSIS — Z992 Dependence on renal dialysis: Secondary | ICD-10-CM | POA: Diagnosis not present

## 2021-03-01 DIAGNOSIS — Z992 Dependence on renal dialysis: Secondary | ICD-10-CM | POA: Diagnosis not present

## 2021-03-01 DIAGNOSIS — N2581 Secondary hyperparathyroidism of renal origin: Secondary | ICD-10-CM | POA: Diagnosis not present

## 2021-03-01 DIAGNOSIS — D689 Coagulation defect, unspecified: Secondary | ICD-10-CM | POA: Diagnosis not present

## 2021-03-01 DIAGNOSIS — I129 Hypertensive chronic kidney disease with stage 1 through stage 4 chronic kidney disease, or unspecified chronic kidney disease: Secondary | ICD-10-CM | POA: Diagnosis not present

## 2021-03-01 DIAGNOSIS — N186 End stage renal disease: Secondary | ICD-10-CM | POA: Diagnosis not present

## 2021-03-04 DIAGNOSIS — E1122 Type 2 diabetes mellitus with diabetic chronic kidney disease: Secondary | ICD-10-CM | POA: Diagnosis not present

## 2021-03-04 DIAGNOSIS — Z992 Dependence on renal dialysis: Secondary | ICD-10-CM | POA: Diagnosis not present

## 2021-03-04 DIAGNOSIS — D631 Anemia in chronic kidney disease: Secondary | ICD-10-CM | POA: Diagnosis not present

## 2021-03-04 DIAGNOSIS — Z23 Encounter for immunization: Secondary | ICD-10-CM | POA: Diagnosis not present

## 2021-03-04 DIAGNOSIS — N2581 Secondary hyperparathyroidism of renal origin: Secondary | ICD-10-CM | POA: Diagnosis not present

## 2021-03-04 DIAGNOSIS — N186 End stage renal disease: Secondary | ICD-10-CM | POA: Diagnosis not present

## 2021-03-04 DIAGNOSIS — D509 Iron deficiency anemia, unspecified: Secondary | ICD-10-CM | POA: Diagnosis not present

## 2021-03-04 DIAGNOSIS — D689 Coagulation defect, unspecified: Secondary | ICD-10-CM | POA: Diagnosis not present

## 2021-03-06 DIAGNOSIS — D689 Coagulation defect, unspecified: Secondary | ICD-10-CM | POA: Diagnosis not present

## 2021-03-06 DIAGNOSIS — D509 Iron deficiency anemia, unspecified: Secondary | ICD-10-CM | POA: Diagnosis not present

## 2021-03-06 DIAGNOSIS — Z23 Encounter for immunization: Secondary | ICD-10-CM | POA: Diagnosis not present

## 2021-03-06 DIAGNOSIS — D631 Anemia in chronic kidney disease: Secondary | ICD-10-CM | POA: Diagnosis not present

## 2021-03-06 DIAGNOSIS — Z992 Dependence on renal dialysis: Secondary | ICD-10-CM | POA: Diagnosis not present

## 2021-03-06 DIAGNOSIS — E1122 Type 2 diabetes mellitus with diabetic chronic kidney disease: Secondary | ICD-10-CM | POA: Diagnosis not present

## 2021-03-06 DIAGNOSIS — N186 End stage renal disease: Secondary | ICD-10-CM | POA: Diagnosis not present

## 2021-03-06 DIAGNOSIS — N2581 Secondary hyperparathyroidism of renal origin: Secondary | ICD-10-CM | POA: Diagnosis not present

## 2021-03-08 DIAGNOSIS — Z992 Dependence on renal dialysis: Secondary | ICD-10-CM | POA: Diagnosis not present

## 2021-03-08 DIAGNOSIS — D689 Coagulation defect, unspecified: Secondary | ICD-10-CM | POA: Diagnosis not present

## 2021-03-08 DIAGNOSIS — E1122 Type 2 diabetes mellitus with diabetic chronic kidney disease: Secondary | ICD-10-CM | POA: Diagnosis not present

## 2021-03-08 DIAGNOSIS — N186 End stage renal disease: Secondary | ICD-10-CM | POA: Diagnosis not present

## 2021-03-08 DIAGNOSIS — D509 Iron deficiency anemia, unspecified: Secondary | ICD-10-CM | POA: Diagnosis not present

## 2021-03-08 DIAGNOSIS — N2581 Secondary hyperparathyroidism of renal origin: Secondary | ICD-10-CM | POA: Diagnosis not present

## 2021-03-08 DIAGNOSIS — Z23 Encounter for immunization: Secondary | ICD-10-CM | POA: Diagnosis not present

## 2021-03-08 DIAGNOSIS — D631 Anemia in chronic kidney disease: Secondary | ICD-10-CM | POA: Diagnosis not present

## 2021-03-11 DIAGNOSIS — D631 Anemia in chronic kidney disease: Secondary | ICD-10-CM | POA: Diagnosis not present

## 2021-03-11 DIAGNOSIS — D689 Coagulation defect, unspecified: Secondary | ICD-10-CM | POA: Diagnosis not present

## 2021-03-11 DIAGNOSIS — N2581 Secondary hyperparathyroidism of renal origin: Secondary | ICD-10-CM | POA: Diagnosis not present

## 2021-03-11 DIAGNOSIS — D509 Iron deficiency anemia, unspecified: Secondary | ICD-10-CM | POA: Diagnosis not present

## 2021-03-11 DIAGNOSIS — E1122 Type 2 diabetes mellitus with diabetic chronic kidney disease: Secondary | ICD-10-CM | POA: Diagnosis not present

## 2021-03-11 DIAGNOSIS — Z23 Encounter for immunization: Secondary | ICD-10-CM | POA: Diagnosis not present

## 2021-03-11 DIAGNOSIS — N186 End stage renal disease: Secondary | ICD-10-CM | POA: Diagnosis not present

## 2021-03-11 DIAGNOSIS — Z992 Dependence on renal dialysis: Secondary | ICD-10-CM | POA: Diagnosis not present

## 2021-03-13 DIAGNOSIS — E1122 Type 2 diabetes mellitus with diabetic chronic kidney disease: Secondary | ICD-10-CM | POA: Diagnosis not present

## 2021-03-13 DIAGNOSIS — D509 Iron deficiency anemia, unspecified: Secondary | ICD-10-CM | POA: Diagnosis not present

## 2021-03-13 DIAGNOSIS — Z992 Dependence on renal dialysis: Secondary | ICD-10-CM | POA: Diagnosis not present

## 2021-03-13 DIAGNOSIS — N2581 Secondary hyperparathyroidism of renal origin: Secondary | ICD-10-CM | POA: Diagnosis not present

## 2021-03-13 DIAGNOSIS — D689 Coagulation defect, unspecified: Secondary | ICD-10-CM | POA: Diagnosis not present

## 2021-03-13 DIAGNOSIS — N186 End stage renal disease: Secondary | ICD-10-CM | POA: Diagnosis not present

## 2021-03-13 DIAGNOSIS — D631 Anemia in chronic kidney disease: Secondary | ICD-10-CM | POA: Diagnosis not present

## 2021-03-13 DIAGNOSIS — Z23 Encounter for immunization: Secondary | ICD-10-CM | POA: Diagnosis not present

## 2021-03-15 DIAGNOSIS — N2581 Secondary hyperparathyroidism of renal origin: Secondary | ICD-10-CM | POA: Diagnosis not present

## 2021-03-15 DIAGNOSIS — D631 Anemia in chronic kidney disease: Secondary | ICD-10-CM | POA: Diagnosis not present

## 2021-03-15 DIAGNOSIS — E1122 Type 2 diabetes mellitus with diabetic chronic kidney disease: Secondary | ICD-10-CM | POA: Diagnosis not present

## 2021-03-15 DIAGNOSIS — N186 End stage renal disease: Secondary | ICD-10-CM | POA: Diagnosis not present

## 2021-03-15 DIAGNOSIS — D509 Iron deficiency anemia, unspecified: Secondary | ICD-10-CM | POA: Diagnosis not present

## 2021-03-15 DIAGNOSIS — Z992 Dependence on renal dialysis: Secondary | ICD-10-CM | POA: Diagnosis not present

## 2021-03-15 DIAGNOSIS — D689 Coagulation defect, unspecified: Secondary | ICD-10-CM | POA: Diagnosis not present

## 2021-03-15 DIAGNOSIS — Z23 Encounter for immunization: Secondary | ICD-10-CM | POA: Diagnosis not present

## 2021-03-18 DIAGNOSIS — N186 End stage renal disease: Secondary | ICD-10-CM | POA: Diagnosis not present

## 2021-03-18 DIAGNOSIS — D631 Anemia in chronic kidney disease: Secondary | ICD-10-CM | POA: Diagnosis not present

## 2021-03-18 DIAGNOSIS — D689 Coagulation defect, unspecified: Secondary | ICD-10-CM | POA: Diagnosis not present

## 2021-03-18 DIAGNOSIS — E1122 Type 2 diabetes mellitus with diabetic chronic kidney disease: Secondary | ICD-10-CM | POA: Diagnosis not present

## 2021-03-18 DIAGNOSIS — N2581 Secondary hyperparathyroidism of renal origin: Secondary | ICD-10-CM | POA: Diagnosis not present

## 2021-03-18 DIAGNOSIS — Z992 Dependence on renal dialysis: Secondary | ICD-10-CM | POA: Diagnosis not present

## 2021-03-18 DIAGNOSIS — Z23 Encounter for immunization: Secondary | ICD-10-CM | POA: Diagnosis not present

## 2021-03-18 DIAGNOSIS — D509 Iron deficiency anemia, unspecified: Secondary | ICD-10-CM | POA: Diagnosis not present

## 2021-03-19 DIAGNOSIS — E1122 Type 2 diabetes mellitus with diabetic chronic kidney disease: Secondary | ICD-10-CM | POA: Diagnosis not present

## 2021-03-19 DIAGNOSIS — N186 End stage renal disease: Secondary | ICD-10-CM | POA: Diagnosis not present

## 2021-03-19 DIAGNOSIS — M170 Bilateral primary osteoarthritis of knee: Secondary | ICD-10-CM | POA: Diagnosis not present

## 2021-03-19 DIAGNOSIS — R296 Repeated falls: Secondary | ICD-10-CM | POA: Diagnosis not present

## 2021-03-19 DIAGNOSIS — I5032 Chronic diastolic (congestive) heart failure: Secondary | ICD-10-CM | POA: Diagnosis not present

## 2021-03-20 DIAGNOSIS — N186 End stage renal disease: Secondary | ICD-10-CM | POA: Diagnosis not present

## 2021-03-20 DIAGNOSIS — D509 Iron deficiency anemia, unspecified: Secondary | ICD-10-CM | POA: Diagnosis not present

## 2021-03-20 DIAGNOSIS — Z23 Encounter for immunization: Secondary | ICD-10-CM | POA: Diagnosis not present

## 2021-03-20 DIAGNOSIS — D689 Coagulation defect, unspecified: Secondary | ICD-10-CM | POA: Diagnosis not present

## 2021-03-20 DIAGNOSIS — N2581 Secondary hyperparathyroidism of renal origin: Secondary | ICD-10-CM | POA: Diagnosis not present

## 2021-03-20 DIAGNOSIS — E1122 Type 2 diabetes mellitus with diabetic chronic kidney disease: Secondary | ICD-10-CM | POA: Diagnosis not present

## 2021-03-20 DIAGNOSIS — Z992 Dependence on renal dialysis: Secondary | ICD-10-CM | POA: Diagnosis not present

## 2021-03-20 DIAGNOSIS — D631 Anemia in chronic kidney disease: Secondary | ICD-10-CM | POA: Diagnosis not present

## 2021-03-22 DIAGNOSIS — D689 Coagulation defect, unspecified: Secondary | ICD-10-CM | POA: Diagnosis not present

## 2021-03-22 DIAGNOSIS — Z23 Encounter for immunization: Secondary | ICD-10-CM | POA: Diagnosis not present

## 2021-03-22 DIAGNOSIS — N186 End stage renal disease: Secondary | ICD-10-CM | POA: Diagnosis not present

## 2021-03-22 DIAGNOSIS — D509 Iron deficiency anemia, unspecified: Secondary | ICD-10-CM | POA: Diagnosis not present

## 2021-03-22 DIAGNOSIS — D631 Anemia in chronic kidney disease: Secondary | ICD-10-CM | POA: Diagnosis not present

## 2021-03-22 DIAGNOSIS — E1122 Type 2 diabetes mellitus with diabetic chronic kidney disease: Secondary | ICD-10-CM | POA: Diagnosis not present

## 2021-03-22 DIAGNOSIS — N2581 Secondary hyperparathyroidism of renal origin: Secondary | ICD-10-CM | POA: Diagnosis not present

## 2021-03-22 DIAGNOSIS — Z992 Dependence on renal dialysis: Secondary | ICD-10-CM | POA: Diagnosis not present

## 2021-03-24 DIAGNOSIS — N2581 Secondary hyperparathyroidism of renal origin: Secondary | ICD-10-CM | POA: Diagnosis not present

## 2021-03-24 DIAGNOSIS — D631 Anemia in chronic kidney disease: Secondary | ICD-10-CM | POA: Diagnosis not present

## 2021-03-24 DIAGNOSIS — D509 Iron deficiency anemia, unspecified: Secondary | ICD-10-CM | POA: Diagnosis not present

## 2021-03-24 DIAGNOSIS — N186 End stage renal disease: Secondary | ICD-10-CM | POA: Diagnosis not present

## 2021-03-24 DIAGNOSIS — Z992 Dependence on renal dialysis: Secondary | ICD-10-CM | POA: Diagnosis not present

## 2021-03-24 DIAGNOSIS — Z23 Encounter for immunization: Secondary | ICD-10-CM | POA: Diagnosis not present

## 2021-03-24 DIAGNOSIS — D689 Coagulation defect, unspecified: Secondary | ICD-10-CM | POA: Diagnosis not present

## 2021-03-24 DIAGNOSIS — E1122 Type 2 diabetes mellitus with diabetic chronic kidney disease: Secondary | ICD-10-CM | POA: Diagnosis not present

## 2021-03-25 DIAGNOSIS — Z992 Dependence on renal dialysis: Secondary | ICD-10-CM | POA: Diagnosis not present

## 2021-03-25 DIAGNOSIS — Z23 Encounter for immunization: Secondary | ICD-10-CM | POA: Diagnosis not present

## 2021-03-25 DIAGNOSIS — E1122 Type 2 diabetes mellitus with diabetic chronic kidney disease: Secondary | ICD-10-CM | POA: Diagnosis not present

## 2021-03-25 DIAGNOSIS — N186 End stage renal disease: Secondary | ICD-10-CM | POA: Diagnosis not present

## 2021-03-25 DIAGNOSIS — D689 Coagulation defect, unspecified: Secondary | ICD-10-CM | POA: Diagnosis not present

## 2021-03-25 DIAGNOSIS — N2581 Secondary hyperparathyroidism of renal origin: Secondary | ICD-10-CM | POA: Diagnosis not present

## 2021-03-25 DIAGNOSIS — D509 Iron deficiency anemia, unspecified: Secondary | ICD-10-CM | POA: Diagnosis not present

## 2021-03-25 DIAGNOSIS — D631 Anemia in chronic kidney disease: Secondary | ICD-10-CM | POA: Diagnosis not present

## 2021-03-27 DIAGNOSIS — D689 Coagulation defect, unspecified: Secondary | ICD-10-CM | POA: Diagnosis not present

## 2021-03-27 DIAGNOSIS — N186 End stage renal disease: Secondary | ICD-10-CM | POA: Diagnosis not present

## 2021-03-27 DIAGNOSIS — Z23 Encounter for immunization: Secondary | ICD-10-CM | POA: Diagnosis not present

## 2021-03-27 DIAGNOSIS — D631 Anemia in chronic kidney disease: Secondary | ICD-10-CM | POA: Diagnosis not present

## 2021-03-27 DIAGNOSIS — N2581 Secondary hyperparathyroidism of renal origin: Secondary | ICD-10-CM | POA: Diagnosis not present

## 2021-03-27 DIAGNOSIS — Z992 Dependence on renal dialysis: Secondary | ICD-10-CM | POA: Diagnosis not present

## 2021-03-27 DIAGNOSIS — D509 Iron deficiency anemia, unspecified: Secondary | ICD-10-CM | POA: Diagnosis not present

## 2021-03-27 DIAGNOSIS — E1122 Type 2 diabetes mellitus with diabetic chronic kidney disease: Secondary | ICD-10-CM | POA: Diagnosis not present

## 2021-03-29 DIAGNOSIS — Z992 Dependence on renal dialysis: Secondary | ICD-10-CM | POA: Diagnosis not present

## 2021-03-29 DIAGNOSIS — Z23 Encounter for immunization: Secondary | ICD-10-CM | POA: Diagnosis not present

## 2021-03-29 DIAGNOSIS — N186 End stage renal disease: Secondary | ICD-10-CM | POA: Diagnosis not present

## 2021-03-29 DIAGNOSIS — D631 Anemia in chronic kidney disease: Secondary | ICD-10-CM | POA: Diagnosis not present

## 2021-03-29 DIAGNOSIS — N2581 Secondary hyperparathyroidism of renal origin: Secondary | ICD-10-CM | POA: Diagnosis not present

## 2021-03-29 DIAGNOSIS — D509 Iron deficiency anemia, unspecified: Secondary | ICD-10-CM | POA: Diagnosis not present

## 2021-03-29 DIAGNOSIS — D689 Coagulation defect, unspecified: Secondary | ICD-10-CM | POA: Diagnosis not present

## 2021-03-29 DIAGNOSIS — E1122 Type 2 diabetes mellitus with diabetic chronic kidney disease: Secondary | ICD-10-CM | POA: Diagnosis not present

## 2021-04-01 DIAGNOSIS — E1122 Type 2 diabetes mellitus with diabetic chronic kidney disease: Secondary | ICD-10-CM | POA: Diagnosis not present

## 2021-04-01 DIAGNOSIS — N2581 Secondary hyperparathyroidism of renal origin: Secondary | ICD-10-CM | POA: Diagnosis not present

## 2021-04-01 DIAGNOSIS — N186 End stage renal disease: Secondary | ICD-10-CM | POA: Diagnosis not present

## 2021-04-01 DIAGNOSIS — Z992 Dependence on renal dialysis: Secondary | ICD-10-CM | POA: Diagnosis not present

## 2021-04-01 DIAGNOSIS — D631 Anemia in chronic kidney disease: Secondary | ICD-10-CM | POA: Diagnosis not present

## 2021-04-01 DIAGNOSIS — I129 Hypertensive chronic kidney disease with stage 1 through stage 4 chronic kidney disease, or unspecified chronic kidney disease: Secondary | ICD-10-CM | POA: Diagnosis not present

## 2021-04-01 DIAGNOSIS — D509 Iron deficiency anemia, unspecified: Secondary | ICD-10-CM | POA: Diagnosis not present

## 2021-04-01 DIAGNOSIS — Z23 Encounter for immunization: Secondary | ICD-10-CM | POA: Diagnosis not present

## 2021-04-01 DIAGNOSIS — D689 Coagulation defect, unspecified: Secondary | ICD-10-CM | POA: Diagnosis not present

## 2021-04-03 DIAGNOSIS — D689 Coagulation defect, unspecified: Secondary | ICD-10-CM | POA: Diagnosis not present

## 2021-04-03 DIAGNOSIS — N2581 Secondary hyperparathyroidism of renal origin: Secondary | ICD-10-CM | POA: Diagnosis not present

## 2021-04-03 DIAGNOSIS — N186 End stage renal disease: Secondary | ICD-10-CM | POA: Diagnosis not present

## 2021-04-03 DIAGNOSIS — Z992 Dependence on renal dialysis: Secondary | ICD-10-CM | POA: Diagnosis not present

## 2021-04-03 DIAGNOSIS — R52 Pain, unspecified: Secondary | ICD-10-CM | POA: Diagnosis not present

## 2021-04-06 DIAGNOSIS — N186 End stage renal disease: Secondary | ICD-10-CM | POA: Diagnosis not present

## 2021-04-06 DIAGNOSIS — D689 Coagulation defect, unspecified: Secondary | ICD-10-CM | POA: Diagnosis not present

## 2021-04-06 DIAGNOSIS — N2581 Secondary hyperparathyroidism of renal origin: Secondary | ICD-10-CM | POA: Diagnosis not present

## 2021-04-06 DIAGNOSIS — R52 Pain, unspecified: Secondary | ICD-10-CM | POA: Diagnosis not present

## 2021-04-06 DIAGNOSIS — Z992 Dependence on renal dialysis: Secondary | ICD-10-CM | POA: Diagnosis not present

## 2021-04-08 DIAGNOSIS — D689 Coagulation defect, unspecified: Secondary | ICD-10-CM | POA: Diagnosis not present

## 2021-04-08 DIAGNOSIS — N2581 Secondary hyperparathyroidism of renal origin: Secondary | ICD-10-CM | POA: Diagnosis not present

## 2021-04-08 DIAGNOSIS — Z992 Dependence on renal dialysis: Secondary | ICD-10-CM | POA: Diagnosis not present

## 2021-04-08 DIAGNOSIS — N186 End stage renal disease: Secondary | ICD-10-CM | POA: Diagnosis not present

## 2021-04-08 DIAGNOSIS — R52 Pain, unspecified: Secondary | ICD-10-CM | POA: Diagnosis not present

## 2021-04-10 DIAGNOSIS — D689 Coagulation defect, unspecified: Secondary | ICD-10-CM | POA: Diagnosis not present

## 2021-04-10 DIAGNOSIS — N2581 Secondary hyperparathyroidism of renal origin: Secondary | ICD-10-CM | POA: Diagnosis not present

## 2021-04-10 DIAGNOSIS — R52 Pain, unspecified: Secondary | ICD-10-CM | POA: Diagnosis not present

## 2021-04-10 DIAGNOSIS — N186 End stage renal disease: Secondary | ICD-10-CM | POA: Diagnosis not present

## 2021-04-10 DIAGNOSIS — Z992 Dependence on renal dialysis: Secondary | ICD-10-CM | POA: Diagnosis not present

## 2021-04-12 DIAGNOSIS — N186 End stage renal disease: Secondary | ICD-10-CM | POA: Diagnosis not present

## 2021-04-12 DIAGNOSIS — R52 Pain, unspecified: Secondary | ICD-10-CM | POA: Diagnosis not present

## 2021-04-12 DIAGNOSIS — N2581 Secondary hyperparathyroidism of renal origin: Secondary | ICD-10-CM | POA: Diagnosis not present

## 2021-04-12 DIAGNOSIS — Z992 Dependence on renal dialysis: Secondary | ICD-10-CM | POA: Diagnosis not present

## 2021-04-12 DIAGNOSIS — D689 Coagulation defect, unspecified: Secondary | ICD-10-CM | POA: Diagnosis not present

## 2021-04-15 DIAGNOSIS — N2581 Secondary hyperparathyroidism of renal origin: Secondary | ICD-10-CM | POA: Diagnosis not present

## 2021-04-15 DIAGNOSIS — Z992 Dependence on renal dialysis: Secondary | ICD-10-CM | POA: Diagnosis not present

## 2021-04-15 DIAGNOSIS — R52 Pain, unspecified: Secondary | ICD-10-CM | POA: Diagnosis not present

## 2021-04-15 DIAGNOSIS — N186 End stage renal disease: Secondary | ICD-10-CM | POA: Diagnosis not present

## 2021-04-15 DIAGNOSIS — D689 Coagulation defect, unspecified: Secondary | ICD-10-CM | POA: Diagnosis not present

## 2021-04-17 DIAGNOSIS — N186 End stage renal disease: Secondary | ICD-10-CM | POA: Diagnosis not present

## 2021-04-17 DIAGNOSIS — N2581 Secondary hyperparathyroidism of renal origin: Secondary | ICD-10-CM | POA: Diagnosis not present

## 2021-04-17 DIAGNOSIS — Z992 Dependence on renal dialysis: Secondary | ICD-10-CM | POA: Diagnosis not present

## 2021-04-17 DIAGNOSIS — D689 Coagulation defect, unspecified: Secondary | ICD-10-CM | POA: Diagnosis not present

## 2021-04-17 DIAGNOSIS — R52 Pain, unspecified: Secondary | ICD-10-CM | POA: Diagnosis not present

## 2021-04-19 DIAGNOSIS — R296 Repeated falls: Secondary | ICD-10-CM | POA: Diagnosis not present

## 2021-04-19 DIAGNOSIS — I5032 Chronic diastolic (congestive) heart failure: Secondary | ICD-10-CM | POA: Diagnosis not present

## 2021-04-19 DIAGNOSIS — M170 Bilateral primary osteoarthritis of knee: Secondary | ICD-10-CM | POA: Diagnosis not present

## 2021-04-19 DIAGNOSIS — N186 End stage renal disease: Secondary | ICD-10-CM | POA: Diagnosis not present

## 2021-04-19 DIAGNOSIS — R52 Pain, unspecified: Secondary | ICD-10-CM | POA: Diagnosis not present

## 2021-04-19 DIAGNOSIS — Z992 Dependence on renal dialysis: Secondary | ICD-10-CM | POA: Diagnosis not present

## 2021-04-19 DIAGNOSIS — D689 Coagulation defect, unspecified: Secondary | ICD-10-CM | POA: Diagnosis not present

## 2021-04-19 DIAGNOSIS — E1122 Type 2 diabetes mellitus with diabetic chronic kidney disease: Secondary | ICD-10-CM | POA: Diagnosis not present

## 2021-04-19 DIAGNOSIS — N2581 Secondary hyperparathyroidism of renal origin: Secondary | ICD-10-CM | POA: Diagnosis not present

## 2021-04-22 DIAGNOSIS — D689 Coagulation defect, unspecified: Secondary | ICD-10-CM | POA: Diagnosis not present

## 2021-04-22 DIAGNOSIS — N186 End stage renal disease: Secondary | ICD-10-CM | POA: Diagnosis not present

## 2021-04-22 DIAGNOSIS — N2581 Secondary hyperparathyroidism of renal origin: Secondary | ICD-10-CM | POA: Diagnosis not present

## 2021-04-22 DIAGNOSIS — R52 Pain, unspecified: Secondary | ICD-10-CM | POA: Diagnosis not present

## 2021-04-22 DIAGNOSIS — Z992 Dependence on renal dialysis: Secondary | ICD-10-CM | POA: Diagnosis not present

## 2021-04-23 DIAGNOSIS — Z992 Dependence on renal dialysis: Secondary | ICD-10-CM | POA: Diagnosis not present

## 2021-04-23 DIAGNOSIS — N186 End stage renal disease: Secondary | ICD-10-CM | POA: Diagnosis not present

## 2021-04-23 DIAGNOSIS — R52 Pain, unspecified: Secondary | ICD-10-CM | POA: Diagnosis not present

## 2021-04-23 DIAGNOSIS — D689 Coagulation defect, unspecified: Secondary | ICD-10-CM | POA: Diagnosis not present

## 2021-04-23 DIAGNOSIS — N2581 Secondary hyperparathyroidism of renal origin: Secondary | ICD-10-CM | POA: Diagnosis not present

## 2021-04-24 DIAGNOSIS — N186 End stage renal disease: Secondary | ICD-10-CM | POA: Diagnosis not present

## 2021-04-24 DIAGNOSIS — D689 Coagulation defect, unspecified: Secondary | ICD-10-CM | POA: Diagnosis not present

## 2021-04-24 DIAGNOSIS — Z992 Dependence on renal dialysis: Secondary | ICD-10-CM | POA: Diagnosis not present

## 2021-04-24 DIAGNOSIS — N2581 Secondary hyperparathyroidism of renal origin: Secondary | ICD-10-CM | POA: Diagnosis not present

## 2021-04-24 DIAGNOSIS — R52 Pain, unspecified: Secondary | ICD-10-CM | POA: Diagnosis not present

## 2021-04-27 DIAGNOSIS — R52 Pain, unspecified: Secondary | ICD-10-CM | POA: Diagnosis not present

## 2021-04-27 DIAGNOSIS — Z992 Dependence on renal dialysis: Secondary | ICD-10-CM | POA: Diagnosis not present

## 2021-04-27 DIAGNOSIS — N186 End stage renal disease: Secondary | ICD-10-CM | POA: Diagnosis not present

## 2021-04-27 DIAGNOSIS — N2581 Secondary hyperparathyroidism of renal origin: Secondary | ICD-10-CM | POA: Diagnosis not present

## 2021-04-27 DIAGNOSIS — D689 Coagulation defect, unspecified: Secondary | ICD-10-CM | POA: Diagnosis not present

## 2021-04-29 DIAGNOSIS — Z992 Dependence on renal dialysis: Secondary | ICD-10-CM | POA: Diagnosis not present

## 2021-04-29 DIAGNOSIS — N2581 Secondary hyperparathyroidism of renal origin: Secondary | ICD-10-CM | POA: Diagnosis not present

## 2021-04-29 DIAGNOSIS — D689 Coagulation defect, unspecified: Secondary | ICD-10-CM | POA: Diagnosis not present

## 2021-04-29 DIAGNOSIS — R52 Pain, unspecified: Secondary | ICD-10-CM | POA: Diagnosis not present

## 2021-04-29 DIAGNOSIS — N186 End stage renal disease: Secondary | ICD-10-CM | POA: Diagnosis not present

## 2021-05-01 DIAGNOSIS — N186 End stage renal disease: Secondary | ICD-10-CM | POA: Diagnosis not present

## 2021-05-01 DIAGNOSIS — Z992 Dependence on renal dialysis: Secondary | ICD-10-CM | POA: Diagnosis not present

## 2021-05-01 DIAGNOSIS — N2581 Secondary hyperparathyroidism of renal origin: Secondary | ICD-10-CM | POA: Diagnosis not present

## 2021-05-01 DIAGNOSIS — I129 Hypertensive chronic kidney disease with stage 1 through stage 4 chronic kidney disease, or unspecified chronic kidney disease: Secondary | ICD-10-CM | POA: Diagnosis not present

## 2021-05-01 DIAGNOSIS — D689 Coagulation defect, unspecified: Secondary | ICD-10-CM | POA: Diagnosis not present

## 2021-05-01 DIAGNOSIS — R52 Pain, unspecified: Secondary | ICD-10-CM | POA: Diagnosis not present

## 2021-05-03 DIAGNOSIS — D689 Coagulation defect, unspecified: Secondary | ICD-10-CM | POA: Diagnosis not present

## 2021-05-03 DIAGNOSIS — N186 End stage renal disease: Secondary | ICD-10-CM | POA: Diagnosis not present

## 2021-05-03 DIAGNOSIS — N2581 Secondary hyperparathyroidism of renal origin: Secondary | ICD-10-CM | POA: Diagnosis not present

## 2021-05-03 DIAGNOSIS — Z992 Dependence on renal dialysis: Secondary | ICD-10-CM | POA: Diagnosis not present

## 2021-05-06 DIAGNOSIS — D689 Coagulation defect, unspecified: Secondary | ICD-10-CM | POA: Diagnosis not present

## 2021-05-06 DIAGNOSIS — N186 End stage renal disease: Secondary | ICD-10-CM | POA: Diagnosis not present

## 2021-05-06 DIAGNOSIS — N2581 Secondary hyperparathyroidism of renal origin: Secondary | ICD-10-CM | POA: Diagnosis not present

## 2021-05-06 DIAGNOSIS — Z992 Dependence on renal dialysis: Secondary | ICD-10-CM | POA: Diagnosis not present

## 2021-05-08 DIAGNOSIS — Z992 Dependence on renal dialysis: Secondary | ICD-10-CM | POA: Diagnosis not present

## 2021-05-08 DIAGNOSIS — N186 End stage renal disease: Secondary | ICD-10-CM | POA: Diagnosis not present

## 2021-05-08 DIAGNOSIS — N2581 Secondary hyperparathyroidism of renal origin: Secondary | ICD-10-CM | POA: Diagnosis not present

## 2021-05-08 DIAGNOSIS — D689 Coagulation defect, unspecified: Secondary | ICD-10-CM | POA: Diagnosis not present

## 2021-05-10 DIAGNOSIS — D689 Coagulation defect, unspecified: Secondary | ICD-10-CM | POA: Diagnosis not present

## 2021-05-10 DIAGNOSIS — Z992 Dependence on renal dialysis: Secondary | ICD-10-CM | POA: Diagnosis not present

## 2021-05-10 DIAGNOSIS — N186 End stage renal disease: Secondary | ICD-10-CM | POA: Diagnosis not present

## 2021-05-10 DIAGNOSIS — N2581 Secondary hyperparathyroidism of renal origin: Secondary | ICD-10-CM | POA: Diagnosis not present

## 2021-05-13 DIAGNOSIS — N186 End stage renal disease: Secondary | ICD-10-CM | POA: Diagnosis not present

## 2021-05-13 DIAGNOSIS — N2581 Secondary hyperparathyroidism of renal origin: Secondary | ICD-10-CM | POA: Diagnosis not present

## 2021-05-13 DIAGNOSIS — D689 Coagulation defect, unspecified: Secondary | ICD-10-CM | POA: Diagnosis not present

## 2021-05-13 DIAGNOSIS — Z992 Dependence on renal dialysis: Secondary | ICD-10-CM | POA: Diagnosis not present

## 2021-05-15 DIAGNOSIS — N186 End stage renal disease: Secondary | ICD-10-CM | POA: Diagnosis not present

## 2021-05-15 DIAGNOSIS — Z992 Dependence on renal dialysis: Secondary | ICD-10-CM | POA: Diagnosis not present

## 2021-05-15 DIAGNOSIS — D689 Coagulation defect, unspecified: Secondary | ICD-10-CM | POA: Diagnosis not present

## 2021-05-15 DIAGNOSIS — N2581 Secondary hyperparathyroidism of renal origin: Secondary | ICD-10-CM | POA: Diagnosis not present

## 2021-05-17 DIAGNOSIS — D689 Coagulation defect, unspecified: Secondary | ICD-10-CM | POA: Diagnosis not present

## 2021-05-17 DIAGNOSIS — Z992 Dependence on renal dialysis: Secondary | ICD-10-CM | POA: Diagnosis not present

## 2021-05-17 DIAGNOSIS — N2581 Secondary hyperparathyroidism of renal origin: Secondary | ICD-10-CM | POA: Diagnosis not present

## 2021-05-17 DIAGNOSIS — N186 End stage renal disease: Secondary | ICD-10-CM | POA: Diagnosis not present

## 2021-05-19 DIAGNOSIS — N186 End stage renal disease: Secondary | ICD-10-CM | POA: Diagnosis not present

## 2021-05-19 DIAGNOSIS — M170 Bilateral primary osteoarthritis of knee: Secondary | ICD-10-CM | POA: Diagnosis not present

## 2021-05-19 DIAGNOSIS — I5032 Chronic diastolic (congestive) heart failure: Secondary | ICD-10-CM | POA: Diagnosis not present

## 2021-05-19 DIAGNOSIS — R296 Repeated falls: Secondary | ICD-10-CM | POA: Diagnosis not present

## 2021-05-19 DIAGNOSIS — E1122 Type 2 diabetes mellitus with diabetic chronic kidney disease: Secondary | ICD-10-CM | POA: Diagnosis not present

## 2021-05-20 DIAGNOSIS — Z992 Dependence on renal dialysis: Secondary | ICD-10-CM | POA: Diagnosis not present

## 2021-05-20 DIAGNOSIS — N186 End stage renal disease: Secondary | ICD-10-CM | POA: Diagnosis not present

## 2021-05-20 DIAGNOSIS — N2581 Secondary hyperparathyroidism of renal origin: Secondary | ICD-10-CM | POA: Diagnosis not present

## 2021-05-20 DIAGNOSIS — D689 Coagulation defect, unspecified: Secondary | ICD-10-CM | POA: Diagnosis not present

## 2021-05-21 DIAGNOSIS — Z992 Dependence on renal dialysis: Secondary | ICD-10-CM | POA: Diagnosis not present

## 2021-05-21 DIAGNOSIS — D689 Coagulation defect, unspecified: Secondary | ICD-10-CM | POA: Diagnosis not present

## 2021-05-21 DIAGNOSIS — N2581 Secondary hyperparathyroidism of renal origin: Secondary | ICD-10-CM | POA: Diagnosis not present

## 2021-05-21 DIAGNOSIS — N186 End stage renal disease: Secondary | ICD-10-CM | POA: Diagnosis not present

## 2021-05-22 DIAGNOSIS — D689 Coagulation defect, unspecified: Secondary | ICD-10-CM | POA: Diagnosis not present

## 2021-05-22 DIAGNOSIS — N186 End stage renal disease: Secondary | ICD-10-CM | POA: Diagnosis not present

## 2021-05-22 DIAGNOSIS — N2581 Secondary hyperparathyroidism of renal origin: Secondary | ICD-10-CM | POA: Diagnosis not present

## 2021-05-22 DIAGNOSIS — Z992 Dependence on renal dialysis: Secondary | ICD-10-CM | POA: Diagnosis not present

## 2021-05-23 DIAGNOSIS — E7800 Pure hypercholesterolemia, unspecified: Secondary | ICD-10-CM | POA: Diagnosis not present

## 2021-05-23 DIAGNOSIS — E1122 Type 2 diabetes mellitus with diabetic chronic kidney disease: Secondary | ICD-10-CM | POA: Diagnosis not present

## 2021-05-23 DIAGNOSIS — Z794 Long term (current) use of insulin: Secondary | ICD-10-CM | POA: Diagnosis not present

## 2021-05-23 DIAGNOSIS — I10 Essential (primary) hypertension: Secondary | ICD-10-CM | POA: Diagnosis not present

## 2021-05-24 DIAGNOSIS — D689 Coagulation defect, unspecified: Secondary | ICD-10-CM | POA: Diagnosis not present

## 2021-05-24 DIAGNOSIS — N2581 Secondary hyperparathyroidism of renal origin: Secondary | ICD-10-CM | POA: Diagnosis not present

## 2021-05-24 DIAGNOSIS — Z992 Dependence on renal dialysis: Secondary | ICD-10-CM | POA: Diagnosis not present

## 2021-05-24 DIAGNOSIS — N186 End stage renal disease: Secondary | ICD-10-CM | POA: Diagnosis not present

## 2021-05-27 DIAGNOSIS — D689 Coagulation defect, unspecified: Secondary | ICD-10-CM | POA: Diagnosis not present

## 2021-05-27 DIAGNOSIS — Z992 Dependence on renal dialysis: Secondary | ICD-10-CM | POA: Diagnosis not present

## 2021-05-27 DIAGNOSIS — N2581 Secondary hyperparathyroidism of renal origin: Secondary | ICD-10-CM | POA: Diagnosis not present

## 2021-05-27 DIAGNOSIS — N186 End stage renal disease: Secondary | ICD-10-CM | POA: Diagnosis not present

## 2021-05-29 DIAGNOSIS — D689 Coagulation defect, unspecified: Secondary | ICD-10-CM | POA: Diagnosis not present

## 2021-05-29 DIAGNOSIS — Z992 Dependence on renal dialysis: Secondary | ICD-10-CM | POA: Diagnosis not present

## 2021-05-29 DIAGNOSIS — N186 End stage renal disease: Secondary | ICD-10-CM | POA: Diagnosis not present

## 2021-05-29 DIAGNOSIS — N2581 Secondary hyperparathyroidism of renal origin: Secondary | ICD-10-CM | POA: Diagnosis not present

## 2021-05-31 DIAGNOSIS — D689 Coagulation defect, unspecified: Secondary | ICD-10-CM | POA: Diagnosis not present

## 2021-05-31 DIAGNOSIS — N186 End stage renal disease: Secondary | ICD-10-CM | POA: Diagnosis not present

## 2021-05-31 DIAGNOSIS — Z992 Dependence on renal dialysis: Secondary | ICD-10-CM | POA: Diagnosis not present

## 2021-05-31 DIAGNOSIS — N2581 Secondary hyperparathyroidism of renal origin: Secondary | ICD-10-CM | POA: Diagnosis not present

## 2021-06-01 DIAGNOSIS — I129 Hypertensive chronic kidney disease with stage 1 through stage 4 chronic kidney disease, or unspecified chronic kidney disease: Secondary | ICD-10-CM | POA: Diagnosis not present

## 2021-06-01 DIAGNOSIS — Z992 Dependence on renal dialysis: Secondary | ICD-10-CM | POA: Diagnosis not present

## 2021-06-01 DIAGNOSIS — N186 End stage renal disease: Secondary | ICD-10-CM | POA: Diagnosis not present

## 2021-06-03 DIAGNOSIS — N186 End stage renal disease: Secondary | ICD-10-CM | POA: Diagnosis not present

## 2021-06-03 DIAGNOSIS — E1122 Type 2 diabetes mellitus with diabetic chronic kidney disease: Secondary | ICD-10-CM | POA: Diagnosis not present

## 2021-06-03 DIAGNOSIS — N2581 Secondary hyperparathyroidism of renal origin: Secondary | ICD-10-CM | POA: Diagnosis not present

## 2021-06-03 DIAGNOSIS — D631 Anemia in chronic kidney disease: Secondary | ICD-10-CM | POA: Diagnosis not present

## 2021-06-03 DIAGNOSIS — D509 Iron deficiency anemia, unspecified: Secondary | ICD-10-CM | POA: Diagnosis not present

## 2021-06-03 DIAGNOSIS — Z992 Dependence on renal dialysis: Secondary | ICD-10-CM | POA: Diagnosis not present

## 2021-06-03 DIAGNOSIS — D689 Coagulation defect, unspecified: Secondary | ICD-10-CM | POA: Diagnosis not present

## 2021-06-05 DIAGNOSIS — D631 Anemia in chronic kidney disease: Secondary | ICD-10-CM | POA: Diagnosis not present

## 2021-06-05 DIAGNOSIS — Z992 Dependence on renal dialysis: Secondary | ICD-10-CM | POA: Diagnosis not present

## 2021-06-05 DIAGNOSIS — D509 Iron deficiency anemia, unspecified: Secondary | ICD-10-CM | POA: Diagnosis not present

## 2021-06-05 DIAGNOSIS — N186 End stage renal disease: Secondary | ICD-10-CM | POA: Diagnosis not present

## 2021-06-05 DIAGNOSIS — N2581 Secondary hyperparathyroidism of renal origin: Secondary | ICD-10-CM | POA: Diagnosis not present

## 2021-06-05 DIAGNOSIS — D689 Coagulation defect, unspecified: Secondary | ICD-10-CM | POA: Diagnosis not present

## 2021-06-05 DIAGNOSIS — E1122 Type 2 diabetes mellitus with diabetic chronic kidney disease: Secondary | ICD-10-CM | POA: Diagnosis not present

## 2021-06-07 DIAGNOSIS — Z992 Dependence on renal dialysis: Secondary | ICD-10-CM | POA: Diagnosis not present

## 2021-06-07 DIAGNOSIS — E1122 Type 2 diabetes mellitus with diabetic chronic kidney disease: Secondary | ICD-10-CM | POA: Diagnosis not present

## 2021-06-07 DIAGNOSIS — N2581 Secondary hyperparathyroidism of renal origin: Secondary | ICD-10-CM | POA: Diagnosis not present

## 2021-06-07 DIAGNOSIS — N186 End stage renal disease: Secondary | ICD-10-CM | POA: Diagnosis not present

## 2021-06-07 DIAGNOSIS — D631 Anemia in chronic kidney disease: Secondary | ICD-10-CM | POA: Diagnosis not present

## 2021-06-07 DIAGNOSIS — D509 Iron deficiency anemia, unspecified: Secondary | ICD-10-CM | POA: Diagnosis not present

## 2021-06-07 DIAGNOSIS — D689 Coagulation defect, unspecified: Secondary | ICD-10-CM | POA: Diagnosis not present

## 2021-06-10 DIAGNOSIS — D631 Anemia in chronic kidney disease: Secondary | ICD-10-CM | POA: Diagnosis not present

## 2021-06-10 DIAGNOSIS — Z992 Dependence on renal dialysis: Secondary | ICD-10-CM | POA: Diagnosis not present

## 2021-06-10 DIAGNOSIS — E1122 Type 2 diabetes mellitus with diabetic chronic kidney disease: Secondary | ICD-10-CM | POA: Diagnosis not present

## 2021-06-10 DIAGNOSIS — D509 Iron deficiency anemia, unspecified: Secondary | ICD-10-CM | POA: Diagnosis not present

## 2021-06-10 DIAGNOSIS — N186 End stage renal disease: Secondary | ICD-10-CM | POA: Diagnosis not present

## 2021-06-10 DIAGNOSIS — D689 Coagulation defect, unspecified: Secondary | ICD-10-CM | POA: Diagnosis not present

## 2021-06-10 DIAGNOSIS — N2581 Secondary hyperparathyroidism of renal origin: Secondary | ICD-10-CM | POA: Diagnosis not present

## 2021-06-12 DIAGNOSIS — N186 End stage renal disease: Secondary | ICD-10-CM | POA: Diagnosis not present

## 2021-06-12 DIAGNOSIS — D631 Anemia in chronic kidney disease: Secondary | ICD-10-CM | POA: Diagnosis not present

## 2021-06-12 DIAGNOSIS — N2581 Secondary hyperparathyroidism of renal origin: Secondary | ICD-10-CM | POA: Diagnosis not present

## 2021-06-12 DIAGNOSIS — Z992 Dependence on renal dialysis: Secondary | ICD-10-CM | POA: Diagnosis not present

## 2021-06-12 DIAGNOSIS — E1122 Type 2 diabetes mellitus with diabetic chronic kidney disease: Secondary | ICD-10-CM | POA: Diagnosis not present

## 2021-06-12 DIAGNOSIS — D509 Iron deficiency anemia, unspecified: Secondary | ICD-10-CM | POA: Diagnosis not present

## 2021-06-12 DIAGNOSIS — D689 Coagulation defect, unspecified: Secondary | ICD-10-CM | POA: Diagnosis not present

## 2021-06-13 DIAGNOSIS — D631 Anemia in chronic kidney disease: Secondary | ICD-10-CM | POA: Diagnosis not present

## 2021-06-13 DIAGNOSIS — D689 Coagulation defect, unspecified: Secondary | ICD-10-CM | POA: Diagnosis not present

## 2021-06-13 DIAGNOSIS — D509 Iron deficiency anemia, unspecified: Secondary | ICD-10-CM | POA: Diagnosis not present

## 2021-06-13 DIAGNOSIS — Z992 Dependence on renal dialysis: Secondary | ICD-10-CM | POA: Diagnosis not present

## 2021-06-13 DIAGNOSIS — N2581 Secondary hyperparathyroidism of renal origin: Secondary | ICD-10-CM | POA: Diagnosis not present

## 2021-06-13 DIAGNOSIS — N186 End stage renal disease: Secondary | ICD-10-CM | POA: Diagnosis not present

## 2021-06-13 DIAGNOSIS — E1122 Type 2 diabetes mellitus with diabetic chronic kidney disease: Secondary | ICD-10-CM | POA: Diagnosis not present

## 2021-06-14 DIAGNOSIS — Z992 Dependence on renal dialysis: Secondary | ICD-10-CM | POA: Diagnosis not present

## 2021-06-14 DIAGNOSIS — D509 Iron deficiency anemia, unspecified: Secondary | ICD-10-CM | POA: Diagnosis not present

## 2021-06-14 DIAGNOSIS — N186 End stage renal disease: Secondary | ICD-10-CM | POA: Diagnosis not present

## 2021-06-14 DIAGNOSIS — N2581 Secondary hyperparathyroidism of renal origin: Secondary | ICD-10-CM | POA: Diagnosis not present

## 2021-06-14 DIAGNOSIS — E1122 Type 2 diabetes mellitus with diabetic chronic kidney disease: Secondary | ICD-10-CM | POA: Diagnosis not present

## 2021-06-14 DIAGNOSIS — D689 Coagulation defect, unspecified: Secondary | ICD-10-CM | POA: Diagnosis not present

## 2021-06-14 DIAGNOSIS — D631 Anemia in chronic kidney disease: Secondary | ICD-10-CM | POA: Diagnosis not present

## 2021-06-17 DIAGNOSIS — N2581 Secondary hyperparathyroidism of renal origin: Secondary | ICD-10-CM | POA: Diagnosis not present

## 2021-06-17 DIAGNOSIS — N186 End stage renal disease: Secondary | ICD-10-CM | POA: Diagnosis not present

## 2021-06-17 DIAGNOSIS — D689 Coagulation defect, unspecified: Secondary | ICD-10-CM | POA: Diagnosis not present

## 2021-06-17 DIAGNOSIS — D631 Anemia in chronic kidney disease: Secondary | ICD-10-CM | POA: Diagnosis not present

## 2021-06-17 DIAGNOSIS — D509 Iron deficiency anemia, unspecified: Secondary | ICD-10-CM | POA: Diagnosis not present

## 2021-06-17 DIAGNOSIS — E1122 Type 2 diabetes mellitus with diabetic chronic kidney disease: Secondary | ICD-10-CM | POA: Diagnosis not present

## 2021-06-17 DIAGNOSIS — Z992 Dependence on renal dialysis: Secondary | ICD-10-CM | POA: Diagnosis not present

## 2021-06-19 DIAGNOSIS — E1122 Type 2 diabetes mellitus with diabetic chronic kidney disease: Secondary | ICD-10-CM | POA: Diagnosis not present

## 2021-06-19 DIAGNOSIS — N2581 Secondary hyperparathyroidism of renal origin: Secondary | ICD-10-CM | POA: Diagnosis not present

## 2021-06-19 DIAGNOSIS — I5032 Chronic diastolic (congestive) heart failure: Secondary | ICD-10-CM | POA: Diagnosis not present

## 2021-06-19 DIAGNOSIS — D689 Coagulation defect, unspecified: Secondary | ICD-10-CM | POA: Diagnosis not present

## 2021-06-19 DIAGNOSIS — D509 Iron deficiency anemia, unspecified: Secondary | ICD-10-CM | POA: Diagnosis not present

## 2021-06-19 DIAGNOSIS — Z992 Dependence on renal dialysis: Secondary | ICD-10-CM | POA: Diagnosis not present

## 2021-06-19 DIAGNOSIS — M170 Bilateral primary osteoarthritis of knee: Secondary | ICD-10-CM | POA: Diagnosis not present

## 2021-06-19 DIAGNOSIS — R296 Repeated falls: Secondary | ICD-10-CM | POA: Diagnosis not present

## 2021-06-19 DIAGNOSIS — D631 Anemia in chronic kidney disease: Secondary | ICD-10-CM | POA: Diagnosis not present

## 2021-06-19 DIAGNOSIS — N186 End stage renal disease: Secondary | ICD-10-CM | POA: Diagnosis not present

## 2021-06-21 DIAGNOSIS — N186 End stage renal disease: Secondary | ICD-10-CM | POA: Diagnosis not present

## 2021-06-21 DIAGNOSIS — D631 Anemia in chronic kidney disease: Secondary | ICD-10-CM | POA: Diagnosis not present

## 2021-06-21 DIAGNOSIS — Z992 Dependence on renal dialysis: Secondary | ICD-10-CM | POA: Diagnosis not present

## 2021-06-21 DIAGNOSIS — D509 Iron deficiency anemia, unspecified: Secondary | ICD-10-CM | POA: Diagnosis not present

## 2021-06-21 DIAGNOSIS — N2581 Secondary hyperparathyroidism of renal origin: Secondary | ICD-10-CM | POA: Diagnosis not present

## 2021-06-21 DIAGNOSIS — D689 Coagulation defect, unspecified: Secondary | ICD-10-CM | POA: Diagnosis not present

## 2021-06-21 DIAGNOSIS — E1122 Type 2 diabetes mellitus with diabetic chronic kidney disease: Secondary | ICD-10-CM | POA: Diagnosis not present

## 2021-06-24 DIAGNOSIS — N186 End stage renal disease: Secondary | ICD-10-CM | POA: Diagnosis not present

## 2021-06-24 DIAGNOSIS — N2581 Secondary hyperparathyroidism of renal origin: Secondary | ICD-10-CM | POA: Diagnosis not present

## 2021-06-24 DIAGNOSIS — D631 Anemia in chronic kidney disease: Secondary | ICD-10-CM | POA: Diagnosis not present

## 2021-06-24 DIAGNOSIS — D689 Coagulation defect, unspecified: Secondary | ICD-10-CM | POA: Diagnosis not present

## 2021-06-24 DIAGNOSIS — D509 Iron deficiency anemia, unspecified: Secondary | ICD-10-CM | POA: Diagnosis not present

## 2021-06-24 DIAGNOSIS — Z992 Dependence on renal dialysis: Secondary | ICD-10-CM | POA: Diagnosis not present

## 2021-06-24 DIAGNOSIS — E1122 Type 2 diabetes mellitus with diabetic chronic kidney disease: Secondary | ICD-10-CM | POA: Diagnosis not present

## 2021-06-26 DIAGNOSIS — D509 Iron deficiency anemia, unspecified: Secondary | ICD-10-CM | POA: Diagnosis not present

## 2021-06-26 DIAGNOSIS — E1122 Type 2 diabetes mellitus with diabetic chronic kidney disease: Secondary | ICD-10-CM | POA: Diagnosis not present

## 2021-06-26 DIAGNOSIS — D631 Anemia in chronic kidney disease: Secondary | ICD-10-CM | POA: Diagnosis not present

## 2021-06-26 DIAGNOSIS — D689 Coagulation defect, unspecified: Secondary | ICD-10-CM | POA: Diagnosis not present

## 2021-06-26 DIAGNOSIS — N2581 Secondary hyperparathyroidism of renal origin: Secondary | ICD-10-CM | POA: Diagnosis not present

## 2021-06-26 DIAGNOSIS — N186 End stage renal disease: Secondary | ICD-10-CM | POA: Diagnosis not present

## 2021-06-26 DIAGNOSIS — Z992 Dependence on renal dialysis: Secondary | ICD-10-CM | POA: Diagnosis not present

## 2021-06-28 DIAGNOSIS — N186 End stage renal disease: Secondary | ICD-10-CM | POA: Diagnosis not present

## 2021-06-28 DIAGNOSIS — E1122 Type 2 diabetes mellitus with diabetic chronic kidney disease: Secondary | ICD-10-CM | POA: Diagnosis not present

## 2021-06-28 DIAGNOSIS — N2581 Secondary hyperparathyroidism of renal origin: Secondary | ICD-10-CM | POA: Diagnosis not present

## 2021-06-28 DIAGNOSIS — Z992 Dependence on renal dialysis: Secondary | ICD-10-CM | POA: Diagnosis not present

## 2021-06-28 DIAGNOSIS — D689 Coagulation defect, unspecified: Secondary | ICD-10-CM | POA: Diagnosis not present

## 2021-06-28 DIAGNOSIS — D631 Anemia in chronic kidney disease: Secondary | ICD-10-CM | POA: Diagnosis not present

## 2021-06-28 DIAGNOSIS — D509 Iron deficiency anemia, unspecified: Secondary | ICD-10-CM | POA: Diagnosis not present

## 2021-07-01 DIAGNOSIS — N186 End stage renal disease: Secondary | ICD-10-CM | POA: Diagnosis not present

## 2021-07-01 DIAGNOSIS — E1122 Type 2 diabetes mellitus with diabetic chronic kidney disease: Secondary | ICD-10-CM | POA: Diagnosis not present

## 2021-07-01 DIAGNOSIS — Z992 Dependence on renal dialysis: Secondary | ICD-10-CM | POA: Diagnosis not present

## 2021-07-01 DIAGNOSIS — D509 Iron deficiency anemia, unspecified: Secondary | ICD-10-CM | POA: Diagnosis not present

## 2021-07-01 DIAGNOSIS — N2581 Secondary hyperparathyroidism of renal origin: Secondary | ICD-10-CM | POA: Diagnosis not present

## 2021-07-01 DIAGNOSIS — D631 Anemia in chronic kidney disease: Secondary | ICD-10-CM | POA: Diagnosis not present

## 2021-07-01 DIAGNOSIS — D689 Coagulation defect, unspecified: Secondary | ICD-10-CM | POA: Diagnosis not present

## 2021-07-02 DIAGNOSIS — I129 Hypertensive chronic kidney disease with stage 1 through stage 4 chronic kidney disease, or unspecified chronic kidney disease: Secondary | ICD-10-CM | POA: Diagnosis not present

## 2021-07-02 DIAGNOSIS — N186 End stage renal disease: Secondary | ICD-10-CM | POA: Diagnosis not present

## 2021-07-02 DIAGNOSIS — Z992 Dependence on renal dialysis: Secondary | ICD-10-CM | POA: Diagnosis not present

## 2021-07-03 DIAGNOSIS — N186 End stage renal disease: Secondary | ICD-10-CM | POA: Diagnosis not present

## 2021-07-03 DIAGNOSIS — Z992 Dependence on renal dialysis: Secondary | ICD-10-CM | POA: Diagnosis not present

## 2021-07-03 DIAGNOSIS — D689 Coagulation defect, unspecified: Secondary | ICD-10-CM | POA: Diagnosis not present

## 2021-07-03 DIAGNOSIS — N2581 Secondary hyperparathyroidism of renal origin: Secondary | ICD-10-CM | POA: Diagnosis not present

## 2021-07-05 DIAGNOSIS — N186 End stage renal disease: Secondary | ICD-10-CM | POA: Diagnosis not present

## 2021-07-05 DIAGNOSIS — N2581 Secondary hyperparathyroidism of renal origin: Secondary | ICD-10-CM | POA: Diagnosis not present

## 2021-07-05 DIAGNOSIS — Z992 Dependence on renal dialysis: Secondary | ICD-10-CM | POA: Diagnosis not present

## 2021-07-05 DIAGNOSIS — D689 Coagulation defect, unspecified: Secondary | ICD-10-CM | POA: Diagnosis not present

## 2021-07-08 DIAGNOSIS — D689 Coagulation defect, unspecified: Secondary | ICD-10-CM | POA: Diagnosis not present

## 2021-07-08 DIAGNOSIS — Z992 Dependence on renal dialysis: Secondary | ICD-10-CM | POA: Diagnosis not present

## 2021-07-08 DIAGNOSIS — N186 End stage renal disease: Secondary | ICD-10-CM | POA: Diagnosis not present

## 2021-07-08 DIAGNOSIS — N2581 Secondary hyperparathyroidism of renal origin: Secondary | ICD-10-CM | POA: Diagnosis not present

## 2021-07-10 DIAGNOSIS — D689 Coagulation defect, unspecified: Secondary | ICD-10-CM | POA: Diagnosis not present

## 2021-07-10 DIAGNOSIS — N186 End stage renal disease: Secondary | ICD-10-CM | POA: Diagnosis not present

## 2021-07-10 DIAGNOSIS — N2581 Secondary hyperparathyroidism of renal origin: Secondary | ICD-10-CM | POA: Diagnosis not present

## 2021-07-10 DIAGNOSIS — Z992 Dependence on renal dialysis: Secondary | ICD-10-CM | POA: Diagnosis not present

## 2021-07-12 DIAGNOSIS — N2581 Secondary hyperparathyroidism of renal origin: Secondary | ICD-10-CM | POA: Diagnosis not present

## 2021-07-12 DIAGNOSIS — D689 Coagulation defect, unspecified: Secondary | ICD-10-CM | POA: Diagnosis not present

## 2021-07-12 DIAGNOSIS — Z992 Dependence on renal dialysis: Secondary | ICD-10-CM | POA: Diagnosis not present

## 2021-07-12 DIAGNOSIS — N186 End stage renal disease: Secondary | ICD-10-CM | POA: Diagnosis not present

## 2021-07-13 DIAGNOSIS — N186 End stage renal disease: Secondary | ICD-10-CM | POA: Diagnosis not present

## 2021-07-13 DIAGNOSIS — N2581 Secondary hyperparathyroidism of renal origin: Secondary | ICD-10-CM | POA: Diagnosis not present

## 2021-07-13 DIAGNOSIS — Z992 Dependence on renal dialysis: Secondary | ICD-10-CM | POA: Diagnosis not present

## 2021-07-13 DIAGNOSIS — D689 Coagulation defect, unspecified: Secondary | ICD-10-CM | POA: Diagnosis not present

## 2021-07-15 DIAGNOSIS — N186 End stage renal disease: Secondary | ICD-10-CM | POA: Diagnosis not present

## 2021-07-15 DIAGNOSIS — N2581 Secondary hyperparathyroidism of renal origin: Secondary | ICD-10-CM | POA: Diagnosis not present

## 2021-07-15 DIAGNOSIS — D689 Coagulation defect, unspecified: Secondary | ICD-10-CM | POA: Diagnosis not present

## 2021-07-15 DIAGNOSIS — Z992 Dependence on renal dialysis: Secondary | ICD-10-CM | POA: Diagnosis not present

## 2021-07-17 DIAGNOSIS — D689 Coagulation defect, unspecified: Secondary | ICD-10-CM | POA: Diagnosis not present

## 2021-07-17 DIAGNOSIS — Z992 Dependence on renal dialysis: Secondary | ICD-10-CM | POA: Diagnosis not present

## 2021-07-17 DIAGNOSIS — N186 End stage renal disease: Secondary | ICD-10-CM | POA: Diagnosis not present

## 2021-07-17 DIAGNOSIS — N2581 Secondary hyperparathyroidism of renal origin: Secondary | ICD-10-CM | POA: Diagnosis not present

## 2021-07-18 DIAGNOSIS — R0989 Other specified symptoms and signs involving the circulatory and respiratory systems: Secondary | ICD-10-CM | POA: Diagnosis not present

## 2021-07-18 DIAGNOSIS — R051 Acute cough: Secondary | ICD-10-CM | POA: Diagnosis not present

## 2021-07-19 DIAGNOSIS — N186 End stage renal disease: Secondary | ICD-10-CM | POA: Diagnosis not present

## 2021-07-19 DIAGNOSIS — Z992 Dependence on renal dialysis: Secondary | ICD-10-CM | POA: Diagnosis not present

## 2021-07-19 DIAGNOSIS — N2581 Secondary hyperparathyroidism of renal origin: Secondary | ICD-10-CM | POA: Diagnosis not present

## 2021-07-19 DIAGNOSIS — D689 Coagulation defect, unspecified: Secondary | ICD-10-CM | POA: Diagnosis not present

## 2021-07-20 DIAGNOSIS — R296 Repeated falls: Secondary | ICD-10-CM | POA: Diagnosis not present

## 2021-07-20 DIAGNOSIS — N186 End stage renal disease: Secondary | ICD-10-CM | POA: Diagnosis not present

## 2021-07-20 DIAGNOSIS — I5032 Chronic diastolic (congestive) heart failure: Secondary | ICD-10-CM | POA: Diagnosis not present

## 2021-07-20 DIAGNOSIS — E1122 Type 2 diabetes mellitus with diabetic chronic kidney disease: Secondary | ICD-10-CM | POA: Diagnosis not present

## 2021-07-20 DIAGNOSIS — M170 Bilateral primary osteoarthritis of knee: Secondary | ICD-10-CM | POA: Diagnosis not present

## 2021-07-22 DIAGNOSIS — N2581 Secondary hyperparathyroidism of renal origin: Secondary | ICD-10-CM | POA: Diagnosis not present

## 2021-07-22 DIAGNOSIS — D689 Coagulation defect, unspecified: Secondary | ICD-10-CM | POA: Diagnosis not present

## 2021-07-22 DIAGNOSIS — Z992 Dependence on renal dialysis: Secondary | ICD-10-CM | POA: Diagnosis not present

## 2021-07-22 DIAGNOSIS — N186 End stage renal disease: Secondary | ICD-10-CM | POA: Diagnosis not present

## 2021-07-24 DIAGNOSIS — Z992 Dependence on renal dialysis: Secondary | ICD-10-CM | POA: Diagnosis not present

## 2021-07-24 DIAGNOSIS — N2581 Secondary hyperparathyroidism of renal origin: Secondary | ICD-10-CM | POA: Diagnosis not present

## 2021-07-24 DIAGNOSIS — D689 Coagulation defect, unspecified: Secondary | ICD-10-CM | POA: Diagnosis not present

## 2021-07-24 DIAGNOSIS — N186 End stage renal disease: Secondary | ICD-10-CM | POA: Diagnosis not present

## 2021-07-26 DIAGNOSIS — Z992 Dependence on renal dialysis: Secondary | ICD-10-CM | POA: Diagnosis not present

## 2021-07-26 DIAGNOSIS — D689 Coagulation defect, unspecified: Secondary | ICD-10-CM | POA: Diagnosis not present

## 2021-07-26 DIAGNOSIS — N186 End stage renal disease: Secondary | ICD-10-CM | POA: Diagnosis not present

## 2021-07-26 DIAGNOSIS — N2581 Secondary hyperparathyroidism of renal origin: Secondary | ICD-10-CM | POA: Diagnosis not present

## 2021-07-29 DIAGNOSIS — Z992 Dependence on renal dialysis: Secondary | ICD-10-CM | POA: Diagnosis not present

## 2021-07-29 DIAGNOSIS — D689 Coagulation defect, unspecified: Secondary | ICD-10-CM | POA: Diagnosis not present

## 2021-07-29 DIAGNOSIS — N186 End stage renal disease: Secondary | ICD-10-CM | POA: Diagnosis not present

## 2021-07-29 DIAGNOSIS — N2581 Secondary hyperparathyroidism of renal origin: Secondary | ICD-10-CM | POA: Diagnosis not present

## 2021-07-30 DIAGNOSIS — Z992 Dependence on renal dialysis: Secondary | ICD-10-CM | POA: Diagnosis not present

## 2021-07-30 DIAGNOSIS — I129 Hypertensive chronic kidney disease with stage 1 through stage 4 chronic kidney disease, or unspecified chronic kidney disease: Secondary | ICD-10-CM | POA: Diagnosis not present

## 2021-07-30 DIAGNOSIS — N186 End stage renal disease: Secondary | ICD-10-CM | POA: Diagnosis not present

## 2021-07-31 DIAGNOSIS — D689 Coagulation defect, unspecified: Secondary | ICD-10-CM | POA: Diagnosis not present

## 2021-07-31 DIAGNOSIS — D509 Iron deficiency anemia, unspecified: Secondary | ICD-10-CM | POA: Diagnosis not present

## 2021-07-31 DIAGNOSIS — N2581 Secondary hyperparathyroidism of renal origin: Secondary | ICD-10-CM | POA: Diagnosis not present

## 2021-07-31 DIAGNOSIS — N186 End stage renal disease: Secondary | ICD-10-CM | POA: Diagnosis not present

## 2021-07-31 DIAGNOSIS — D631 Anemia in chronic kidney disease: Secondary | ICD-10-CM | POA: Diagnosis not present

## 2021-07-31 DIAGNOSIS — Z992 Dependence on renal dialysis: Secondary | ICD-10-CM | POA: Diagnosis not present

## 2021-08-02 DIAGNOSIS — N186 End stage renal disease: Secondary | ICD-10-CM | POA: Diagnosis not present

## 2021-08-02 DIAGNOSIS — D509 Iron deficiency anemia, unspecified: Secondary | ICD-10-CM | POA: Diagnosis not present

## 2021-08-02 DIAGNOSIS — D689 Coagulation defect, unspecified: Secondary | ICD-10-CM | POA: Diagnosis not present

## 2021-08-02 DIAGNOSIS — N2581 Secondary hyperparathyroidism of renal origin: Secondary | ICD-10-CM | POA: Diagnosis not present

## 2021-08-02 DIAGNOSIS — Z992 Dependence on renal dialysis: Secondary | ICD-10-CM | POA: Diagnosis not present

## 2021-08-02 DIAGNOSIS — D631 Anemia in chronic kidney disease: Secondary | ICD-10-CM | POA: Diagnosis not present

## 2021-08-05 DIAGNOSIS — D631 Anemia in chronic kidney disease: Secondary | ICD-10-CM | POA: Diagnosis not present

## 2021-08-05 DIAGNOSIS — N2581 Secondary hyperparathyroidism of renal origin: Secondary | ICD-10-CM | POA: Diagnosis not present

## 2021-08-05 DIAGNOSIS — N186 End stage renal disease: Secondary | ICD-10-CM | POA: Diagnosis not present

## 2021-08-05 DIAGNOSIS — D509 Iron deficiency anemia, unspecified: Secondary | ICD-10-CM | POA: Diagnosis not present

## 2021-08-05 DIAGNOSIS — Z992 Dependence on renal dialysis: Secondary | ICD-10-CM | POA: Diagnosis not present

## 2021-08-05 DIAGNOSIS — D689 Coagulation defect, unspecified: Secondary | ICD-10-CM | POA: Diagnosis not present

## 2021-08-07 DIAGNOSIS — Z992 Dependence on renal dialysis: Secondary | ICD-10-CM | POA: Diagnosis not present

## 2021-08-07 DIAGNOSIS — D631 Anemia in chronic kidney disease: Secondary | ICD-10-CM | POA: Diagnosis not present

## 2021-08-07 DIAGNOSIS — N186 End stage renal disease: Secondary | ICD-10-CM | POA: Diagnosis not present

## 2021-08-07 DIAGNOSIS — N2581 Secondary hyperparathyroidism of renal origin: Secondary | ICD-10-CM | POA: Diagnosis not present

## 2021-08-07 DIAGNOSIS — D509 Iron deficiency anemia, unspecified: Secondary | ICD-10-CM | POA: Diagnosis not present

## 2021-08-07 DIAGNOSIS — D689 Coagulation defect, unspecified: Secondary | ICD-10-CM | POA: Diagnosis not present

## 2021-08-09 DIAGNOSIS — D631 Anemia in chronic kidney disease: Secondary | ICD-10-CM | POA: Diagnosis not present

## 2021-08-09 DIAGNOSIS — Z992 Dependence on renal dialysis: Secondary | ICD-10-CM | POA: Diagnosis not present

## 2021-08-09 DIAGNOSIS — N186 End stage renal disease: Secondary | ICD-10-CM | POA: Diagnosis not present

## 2021-08-09 DIAGNOSIS — D509 Iron deficiency anemia, unspecified: Secondary | ICD-10-CM | POA: Diagnosis not present

## 2021-08-09 DIAGNOSIS — D689 Coagulation defect, unspecified: Secondary | ICD-10-CM | POA: Diagnosis not present

## 2021-08-09 DIAGNOSIS — N2581 Secondary hyperparathyroidism of renal origin: Secondary | ICD-10-CM | POA: Diagnosis not present

## 2021-08-10 DIAGNOSIS — Z992 Dependence on renal dialysis: Secondary | ICD-10-CM | POA: Diagnosis not present

## 2021-08-10 DIAGNOSIS — N186 End stage renal disease: Secondary | ICD-10-CM | POA: Diagnosis not present

## 2021-08-10 DIAGNOSIS — D689 Coagulation defect, unspecified: Secondary | ICD-10-CM | POA: Diagnosis not present

## 2021-08-10 DIAGNOSIS — N2581 Secondary hyperparathyroidism of renal origin: Secondary | ICD-10-CM | POA: Diagnosis not present

## 2021-08-12 DIAGNOSIS — N186 End stage renal disease: Secondary | ICD-10-CM | POA: Diagnosis not present

## 2021-08-12 DIAGNOSIS — Z992 Dependence on renal dialysis: Secondary | ICD-10-CM | POA: Diagnosis not present

## 2021-08-12 DIAGNOSIS — N2581 Secondary hyperparathyroidism of renal origin: Secondary | ICD-10-CM | POA: Diagnosis not present

## 2021-08-12 DIAGNOSIS — D509 Iron deficiency anemia, unspecified: Secondary | ICD-10-CM | POA: Diagnosis not present

## 2021-08-12 DIAGNOSIS — D631 Anemia in chronic kidney disease: Secondary | ICD-10-CM | POA: Diagnosis not present

## 2021-08-12 DIAGNOSIS — D689 Coagulation defect, unspecified: Secondary | ICD-10-CM | POA: Diagnosis not present

## 2021-08-14 DIAGNOSIS — D631 Anemia in chronic kidney disease: Secondary | ICD-10-CM | POA: Diagnosis not present

## 2021-08-14 DIAGNOSIS — D689 Coagulation defect, unspecified: Secondary | ICD-10-CM | POA: Diagnosis not present

## 2021-08-14 DIAGNOSIS — D509 Iron deficiency anemia, unspecified: Secondary | ICD-10-CM | POA: Diagnosis not present

## 2021-08-14 DIAGNOSIS — N2581 Secondary hyperparathyroidism of renal origin: Secondary | ICD-10-CM | POA: Diagnosis not present

## 2021-08-14 DIAGNOSIS — Z992 Dependence on renal dialysis: Secondary | ICD-10-CM | POA: Diagnosis not present

## 2021-08-14 DIAGNOSIS — N186 End stage renal disease: Secondary | ICD-10-CM | POA: Diagnosis not present

## 2021-08-16 DIAGNOSIS — D631 Anemia in chronic kidney disease: Secondary | ICD-10-CM | POA: Diagnosis not present

## 2021-08-16 DIAGNOSIS — D509 Iron deficiency anemia, unspecified: Secondary | ICD-10-CM | POA: Diagnosis not present

## 2021-08-16 DIAGNOSIS — D689 Coagulation defect, unspecified: Secondary | ICD-10-CM | POA: Diagnosis not present

## 2021-08-16 DIAGNOSIS — N2581 Secondary hyperparathyroidism of renal origin: Secondary | ICD-10-CM | POA: Diagnosis not present

## 2021-08-16 DIAGNOSIS — Z992 Dependence on renal dialysis: Secondary | ICD-10-CM | POA: Diagnosis not present

## 2021-08-16 DIAGNOSIS — N186 End stage renal disease: Secondary | ICD-10-CM | POA: Diagnosis not present

## 2021-08-19 DIAGNOSIS — N186 End stage renal disease: Secondary | ICD-10-CM | POA: Diagnosis not present

## 2021-08-19 DIAGNOSIS — N2581 Secondary hyperparathyroidism of renal origin: Secondary | ICD-10-CM | POA: Diagnosis not present

## 2021-08-19 DIAGNOSIS — D689 Coagulation defect, unspecified: Secondary | ICD-10-CM | POA: Diagnosis not present

## 2021-08-19 DIAGNOSIS — D631 Anemia in chronic kidney disease: Secondary | ICD-10-CM | POA: Diagnosis not present

## 2021-08-19 DIAGNOSIS — Z992 Dependence on renal dialysis: Secondary | ICD-10-CM | POA: Diagnosis not present

## 2021-08-19 DIAGNOSIS — D509 Iron deficiency anemia, unspecified: Secondary | ICD-10-CM | POA: Diagnosis not present

## 2021-08-21 DIAGNOSIS — N186 End stage renal disease: Secondary | ICD-10-CM | POA: Diagnosis not present

## 2021-08-21 DIAGNOSIS — D509 Iron deficiency anemia, unspecified: Secondary | ICD-10-CM | POA: Diagnosis not present

## 2021-08-21 DIAGNOSIS — N2581 Secondary hyperparathyroidism of renal origin: Secondary | ICD-10-CM | POA: Diagnosis not present

## 2021-08-21 DIAGNOSIS — Z992 Dependence on renal dialysis: Secondary | ICD-10-CM | POA: Diagnosis not present

## 2021-08-21 DIAGNOSIS — D631 Anemia in chronic kidney disease: Secondary | ICD-10-CM | POA: Diagnosis not present

## 2021-08-21 DIAGNOSIS — D689 Coagulation defect, unspecified: Secondary | ICD-10-CM | POA: Diagnosis not present

## 2021-08-23 DIAGNOSIS — Z992 Dependence on renal dialysis: Secondary | ICD-10-CM | POA: Diagnosis not present

## 2021-08-23 DIAGNOSIS — D689 Coagulation defect, unspecified: Secondary | ICD-10-CM | POA: Diagnosis not present

## 2021-08-23 DIAGNOSIS — N186 End stage renal disease: Secondary | ICD-10-CM | POA: Diagnosis not present

## 2021-08-23 DIAGNOSIS — D509 Iron deficiency anemia, unspecified: Secondary | ICD-10-CM | POA: Diagnosis not present

## 2021-08-23 DIAGNOSIS — D631 Anemia in chronic kidney disease: Secondary | ICD-10-CM | POA: Diagnosis not present

## 2021-08-23 DIAGNOSIS — N2581 Secondary hyperparathyroidism of renal origin: Secondary | ICD-10-CM | POA: Diagnosis not present

## 2021-08-26 DIAGNOSIS — Z992 Dependence on renal dialysis: Secondary | ICD-10-CM | POA: Diagnosis not present

## 2021-08-26 DIAGNOSIS — D631 Anemia in chronic kidney disease: Secondary | ICD-10-CM | POA: Diagnosis not present

## 2021-08-26 DIAGNOSIS — N186 End stage renal disease: Secondary | ICD-10-CM | POA: Diagnosis not present

## 2021-08-26 DIAGNOSIS — D689 Coagulation defect, unspecified: Secondary | ICD-10-CM | POA: Diagnosis not present

## 2021-08-26 DIAGNOSIS — N2581 Secondary hyperparathyroidism of renal origin: Secondary | ICD-10-CM | POA: Diagnosis not present

## 2021-08-26 DIAGNOSIS — D509 Iron deficiency anemia, unspecified: Secondary | ICD-10-CM | POA: Diagnosis not present

## 2021-08-28 DIAGNOSIS — N186 End stage renal disease: Secondary | ICD-10-CM | POA: Diagnosis not present

## 2021-08-28 DIAGNOSIS — Z992 Dependence on renal dialysis: Secondary | ICD-10-CM | POA: Diagnosis not present

## 2021-08-28 DIAGNOSIS — D631 Anemia in chronic kidney disease: Secondary | ICD-10-CM | POA: Diagnosis not present

## 2021-08-28 DIAGNOSIS — N2581 Secondary hyperparathyroidism of renal origin: Secondary | ICD-10-CM | POA: Diagnosis not present

## 2021-08-28 DIAGNOSIS — D509 Iron deficiency anemia, unspecified: Secondary | ICD-10-CM | POA: Diagnosis not present

## 2021-08-28 DIAGNOSIS — D689 Coagulation defect, unspecified: Secondary | ICD-10-CM | POA: Diagnosis not present

## 2021-08-30 DIAGNOSIS — D689 Coagulation defect, unspecified: Secondary | ICD-10-CM | POA: Diagnosis not present

## 2021-08-30 DIAGNOSIS — N186 End stage renal disease: Secondary | ICD-10-CM | POA: Diagnosis not present

## 2021-08-30 DIAGNOSIS — I129 Hypertensive chronic kidney disease with stage 1 through stage 4 chronic kidney disease, or unspecified chronic kidney disease: Secondary | ICD-10-CM | POA: Diagnosis not present

## 2021-08-30 DIAGNOSIS — N2581 Secondary hyperparathyroidism of renal origin: Secondary | ICD-10-CM | POA: Diagnosis not present

## 2021-08-30 DIAGNOSIS — Z992 Dependence on renal dialysis: Secondary | ICD-10-CM | POA: Diagnosis not present

## 2021-09-02 DIAGNOSIS — N2581 Secondary hyperparathyroidism of renal origin: Secondary | ICD-10-CM | POA: Diagnosis not present

## 2021-09-02 DIAGNOSIS — D689 Coagulation defect, unspecified: Secondary | ICD-10-CM | POA: Diagnosis not present

## 2021-09-02 DIAGNOSIS — E1122 Type 2 diabetes mellitus with diabetic chronic kidney disease: Secondary | ICD-10-CM | POA: Diagnosis not present

## 2021-09-02 DIAGNOSIS — D631 Anemia in chronic kidney disease: Secondary | ICD-10-CM | POA: Diagnosis not present

## 2021-09-02 DIAGNOSIS — Z992 Dependence on renal dialysis: Secondary | ICD-10-CM | POA: Diagnosis not present

## 2021-09-02 DIAGNOSIS — N186 End stage renal disease: Secondary | ICD-10-CM | POA: Diagnosis not present

## 2021-09-02 DIAGNOSIS — D509 Iron deficiency anemia, unspecified: Secondary | ICD-10-CM | POA: Diagnosis not present

## 2021-09-04 DIAGNOSIS — Z992 Dependence on renal dialysis: Secondary | ICD-10-CM | POA: Diagnosis not present

## 2021-09-04 DIAGNOSIS — E1122 Type 2 diabetes mellitus with diabetic chronic kidney disease: Secondary | ICD-10-CM | POA: Diagnosis not present

## 2021-09-04 DIAGNOSIS — N2581 Secondary hyperparathyroidism of renal origin: Secondary | ICD-10-CM | POA: Diagnosis not present

## 2021-09-04 DIAGNOSIS — D689 Coagulation defect, unspecified: Secondary | ICD-10-CM | POA: Diagnosis not present

## 2021-09-04 DIAGNOSIS — D631 Anemia in chronic kidney disease: Secondary | ICD-10-CM | POA: Diagnosis not present

## 2021-09-04 DIAGNOSIS — D509 Iron deficiency anemia, unspecified: Secondary | ICD-10-CM | POA: Diagnosis not present

## 2021-09-04 DIAGNOSIS — N186 End stage renal disease: Secondary | ICD-10-CM | POA: Diagnosis not present

## 2021-09-06 DIAGNOSIS — E1122 Type 2 diabetes mellitus with diabetic chronic kidney disease: Secondary | ICD-10-CM | POA: Diagnosis not present

## 2021-09-06 DIAGNOSIS — N186 End stage renal disease: Secondary | ICD-10-CM | POA: Diagnosis not present

## 2021-09-06 DIAGNOSIS — D631 Anemia in chronic kidney disease: Secondary | ICD-10-CM | POA: Diagnosis not present

## 2021-09-06 DIAGNOSIS — N2581 Secondary hyperparathyroidism of renal origin: Secondary | ICD-10-CM | POA: Diagnosis not present

## 2021-09-06 DIAGNOSIS — D509 Iron deficiency anemia, unspecified: Secondary | ICD-10-CM | POA: Diagnosis not present

## 2021-09-06 DIAGNOSIS — Z992 Dependence on renal dialysis: Secondary | ICD-10-CM | POA: Diagnosis not present

## 2021-09-06 DIAGNOSIS — D689 Coagulation defect, unspecified: Secondary | ICD-10-CM | POA: Diagnosis not present

## 2021-09-09 DIAGNOSIS — D631 Anemia in chronic kidney disease: Secondary | ICD-10-CM | POA: Diagnosis not present

## 2021-09-09 DIAGNOSIS — D689 Coagulation defect, unspecified: Secondary | ICD-10-CM | POA: Diagnosis not present

## 2021-09-09 DIAGNOSIS — N186 End stage renal disease: Secondary | ICD-10-CM | POA: Diagnosis not present

## 2021-09-09 DIAGNOSIS — D509 Iron deficiency anemia, unspecified: Secondary | ICD-10-CM | POA: Diagnosis not present

## 2021-09-09 DIAGNOSIS — E1122 Type 2 diabetes mellitus with diabetic chronic kidney disease: Secondary | ICD-10-CM | POA: Diagnosis not present

## 2021-09-09 DIAGNOSIS — N2581 Secondary hyperparathyroidism of renal origin: Secondary | ICD-10-CM | POA: Diagnosis not present

## 2021-09-09 DIAGNOSIS — Z992 Dependence on renal dialysis: Secondary | ICD-10-CM | POA: Diagnosis not present

## 2021-09-10 DIAGNOSIS — Z794 Long term (current) use of insulin: Secondary | ICD-10-CM | POA: Diagnosis not present

## 2021-09-10 DIAGNOSIS — N186 End stage renal disease: Secondary | ICD-10-CM | POA: Diagnosis not present

## 2021-09-10 DIAGNOSIS — E1122 Type 2 diabetes mellitus with diabetic chronic kidney disease: Secondary | ICD-10-CM | POA: Diagnosis not present

## 2021-09-10 DIAGNOSIS — I10 Essential (primary) hypertension: Secondary | ICD-10-CM | POA: Diagnosis not present

## 2021-09-10 DIAGNOSIS — E789 Disorder of lipoprotein metabolism, unspecified: Secondary | ICD-10-CM | POA: Diagnosis not present

## 2021-09-11 DIAGNOSIS — D689 Coagulation defect, unspecified: Secondary | ICD-10-CM | POA: Diagnosis not present

## 2021-09-11 DIAGNOSIS — N186 End stage renal disease: Secondary | ICD-10-CM | POA: Diagnosis not present

## 2021-09-11 DIAGNOSIS — Z992 Dependence on renal dialysis: Secondary | ICD-10-CM | POA: Diagnosis not present

## 2021-09-11 DIAGNOSIS — D631 Anemia in chronic kidney disease: Secondary | ICD-10-CM | POA: Diagnosis not present

## 2021-09-11 DIAGNOSIS — N2581 Secondary hyperparathyroidism of renal origin: Secondary | ICD-10-CM | POA: Diagnosis not present

## 2021-09-11 DIAGNOSIS — D509 Iron deficiency anemia, unspecified: Secondary | ICD-10-CM | POA: Diagnosis not present

## 2021-09-11 DIAGNOSIS — E1122 Type 2 diabetes mellitus with diabetic chronic kidney disease: Secondary | ICD-10-CM | POA: Diagnosis not present

## 2021-09-12 DIAGNOSIS — E789 Disorder of lipoprotein metabolism, unspecified: Secondary | ICD-10-CM | POA: Diagnosis not present

## 2021-09-12 DIAGNOSIS — I10 Essential (primary) hypertension: Secondary | ICD-10-CM | POA: Diagnosis not present

## 2021-09-12 DIAGNOSIS — N186 End stage renal disease: Secondary | ICD-10-CM | POA: Diagnosis not present

## 2021-09-13 DIAGNOSIS — N186 End stage renal disease: Secondary | ICD-10-CM | POA: Diagnosis not present

## 2021-09-13 DIAGNOSIS — E1122 Type 2 diabetes mellitus with diabetic chronic kidney disease: Secondary | ICD-10-CM | POA: Diagnosis not present

## 2021-09-13 DIAGNOSIS — D509 Iron deficiency anemia, unspecified: Secondary | ICD-10-CM | POA: Diagnosis not present

## 2021-09-13 DIAGNOSIS — D631 Anemia in chronic kidney disease: Secondary | ICD-10-CM | POA: Diagnosis not present

## 2021-09-13 DIAGNOSIS — D689 Coagulation defect, unspecified: Secondary | ICD-10-CM | POA: Diagnosis not present

## 2021-09-13 DIAGNOSIS — N2581 Secondary hyperparathyroidism of renal origin: Secondary | ICD-10-CM | POA: Diagnosis not present

## 2021-09-13 DIAGNOSIS — Z992 Dependence on renal dialysis: Secondary | ICD-10-CM | POA: Diagnosis not present

## 2021-09-16 DIAGNOSIS — D631 Anemia in chronic kidney disease: Secondary | ICD-10-CM | POA: Diagnosis not present

## 2021-09-16 DIAGNOSIS — Z992 Dependence on renal dialysis: Secondary | ICD-10-CM | POA: Diagnosis not present

## 2021-09-16 DIAGNOSIS — E1122 Type 2 diabetes mellitus with diabetic chronic kidney disease: Secondary | ICD-10-CM | POA: Diagnosis not present

## 2021-09-16 DIAGNOSIS — N2581 Secondary hyperparathyroidism of renal origin: Secondary | ICD-10-CM | POA: Diagnosis not present

## 2021-09-16 DIAGNOSIS — D509 Iron deficiency anemia, unspecified: Secondary | ICD-10-CM | POA: Diagnosis not present

## 2021-09-16 DIAGNOSIS — N186 End stage renal disease: Secondary | ICD-10-CM | POA: Diagnosis not present

## 2021-09-16 DIAGNOSIS — D689 Coagulation defect, unspecified: Secondary | ICD-10-CM | POA: Diagnosis not present

## 2021-09-18 DIAGNOSIS — N186 End stage renal disease: Secondary | ICD-10-CM | POA: Diagnosis not present

## 2021-09-18 DIAGNOSIS — D631 Anemia in chronic kidney disease: Secondary | ICD-10-CM | POA: Diagnosis not present

## 2021-09-18 DIAGNOSIS — D689 Coagulation defect, unspecified: Secondary | ICD-10-CM | POA: Diagnosis not present

## 2021-09-18 DIAGNOSIS — Z992 Dependence on renal dialysis: Secondary | ICD-10-CM | POA: Diagnosis not present

## 2021-09-18 DIAGNOSIS — E1122 Type 2 diabetes mellitus with diabetic chronic kidney disease: Secondary | ICD-10-CM | POA: Diagnosis not present

## 2021-09-18 DIAGNOSIS — N2581 Secondary hyperparathyroidism of renal origin: Secondary | ICD-10-CM | POA: Diagnosis not present

## 2021-09-18 DIAGNOSIS — D509 Iron deficiency anemia, unspecified: Secondary | ICD-10-CM | POA: Diagnosis not present

## 2021-09-19 DIAGNOSIS — Z86010 Personal history of colonic polyps: Secondary | ICD-10-CM | POA: Diagnosis not present

## 2021-09-19 DIAGNOSIS — Z0000 Encounter for general adult medical examination without abnormal findings: Secondary | ICD-10-CM | POA: Diagnosis not present

## 2021-09-19 DIAGNOSIS — N186 End stage renal disease: Secondary | ICD-10-CM | POA: Diagnosis not present

## 2021-09-19 DIAGNOSIS — I5032 Chronic diastolic (congestive) heart failure: Secondary | ICD-10-CM | POA: Diagnosis not present

## 2021-09-19 DIAGNOSIS — Z8739 Personal history of other diseases of the musculoskeletal system and connective tissue: Secondary | ICD-10-CM | POA: Diagnosis not present

## 2021-09-19 DIAGNOSIS — G4733 Obstructive sleep apnea (adult) (pediatric): Secondary | ICD-10-CM | POA: Diagnosis not present

## 2021-09-19 DIAGNOSIS — N2581 Secondary hyperparathyroidism of renal origin: Secondary | ICD-10-CM | POA: Diagnosis not present

## 2021-09-19 DIAGNOSIS — E1122 Type 2 diabetes mellitus with diabetic chronic kidney disease: Secondary | ICD-10-CM | POA: Diagnosis not present

## 2021-09-20 DIAGNOSIS — Z992 Dependence on renal dialysis: Secondary | ICD-10-CM | POA: Diagnosis not present

## 2021-09-20 DIAGNOSIS — D689 Coagulation defect, unspecified: Secondary | ICD-10-CM | POA: Diagnosis not present

## 2021-09-20 DIAGNOSIS — N186 End stage renal disease: Secondary | ICD-10-CM | POA: Diagnosis not present

## 2021-09-20 DIAGNOSIS — N2581 Secondary hyperparathyroidism of renal origin: Secondary | ICD-10-CM | POA: Diagnosis not present

## 2021-09-20 DIAGNOSIS — D631 Anemia in chronic kidney disease: Secondary | ICD-10-CM | POA: Diagnosis not present

## 2021-09-20 DIAGNOSIS — D509 Iron deficiency anemia, unspecified: Secondary | ICD-10-CM | POA: Diagnosis not present

## 2021-09-20 DIAGNOSIS — E1122 Type 2 diabetes mellitus with diabetic chronic kidney disease: Secondary | ICD-10-CM | POA: Diagnosis not present

## 2021-09-23 DIAGNOSIS — E1122 Type 2 diabetes mellitus with diabetic chronic kidney disease: Secondary | ICD-10-CM | POA: Diagnosis not present

## 2021-09-23 DIAGNOSIS — D509 Iron deficiency anemia, unspecified: Secondary | ICD-10-CM | POA: Diagnosis not present

## 2021-09-23 DIAGNOSIS — N186 End stage renal disease: Secondary | ICD-10-CM | POA: Diagnosis not present

## 2021-09-23 DIAGNOSIS — N2581 Secondary hyperparathyroidism of renal origin: Secondary | ICD-10-CM | POA: Diagnosis not present

## 2021-09-23 DIAGNOSIS — D689 Coagulation defect, unspecified: Secondary | ICD-10-CM | POA: Diagnosis not present

## 2021-09-23 DIAGNOSIS — D631 Anemia in chronic kidney disease: Secondary | ICD-10-CM | POA: Diagnosis not present

## 2021-09-23 DIAGNOSIS — Z992 Dependence on renal dialysis: Secondary | ICD-10-CM | POA: Diagnosis not present

## 2021-09-25 DIAGNOSIS — D689 Coagulation defect, unspecified: Secondary | ICD-10-CM | POA: Diagnosis not present

## 2021-09-25 DIAGNOSIS — E1122 Type 2 diabetes mellitus with diabetic chronic kidney disease: Secondary | ICD-10-CM | POA: Diagnosis not present

## 2021-09-25 DIAGNOSIS — D509 Iron deficiency anemia, unspecified: Secondary | ICD-10-CM | POA: Diagnosis not present

## 2021-09-25 DIAGNOSIS — N2581 Secondary hyperparathyroidism of renal origin: Secondary | ICD-10-CM | POA: Diagnosis not present

## 2021-09-25 DIAGNOSIS — D631 Anemia in chronic kidney disease: Secondary | ICD-10-CM | POA: Diagnosis not present

## 2021-09-25 DIAGNOSIS — Z992 Dependence on renal dialysis: Secondary | ICD-10-CM | POA: Diagnosis not present

## 2021-09-25 DIAGNOSIS — N186 End stage renal disease: Secondary | ICD-10-CM | POA: Diagnosis not present

## 2021-09-27 DIAGNOSIS — N186 End stage renal disease: Secondary | ICD-10-CM | POA: Diagnosis not present

## 2021-09-27 DIAGNOSIS — N2581 Secondary hyperparathyroidism of renal origin: Secondary | ICD-10-CM | POA: Diagnosis not present

## 2021-09-27 DIAGNOSIS — E1122 Type 2 diabetes mellitus with diabetic chronic kidney disease: Secondary | ICD-10-CM | POA: Diagnosis not present

## 2021-09-27 DIAGNOSIS — Z992 Dependence on renal dialysis: Secondary | ICD-10-CM | POA: Diagnosis not present

## 2021-09-27 DIAGNOSIS — D509 Iron deficiency anemia, unspecified: Secondary | ICD-10-CM | POA: Diagnosis not present

## 2021-09-27 DIAGNOSIS — D689 Coagulation defect, unspecified: Secondary | ICD-10-CM | POA: Diagnosis not present

## 2021-09-27 DIAGNOSIS — D631 Anemia in chronic kidney disease: Secondary | ICD-10-CM | POA: Diagnosis not present

## 2021-09-29 DIAGNOSIS — E118 Type 2 diabetes mellitus with unspecified complications: Secondary | ICD-10-CM | POA: Diagnosis not present

## 2021-09-29 DIAGNOSIS — E1122 Type 2 diabetes mellitus with diabetic chronic kidney disease: Secondary | ICD-10-CM | POA: Diagnosis not present

## 2021-09-29 DIAGNOSIS — N186 End stage renal disease: Secondary | ICD-10-CM | POA: Diagnosis not present

## 2021-09-29 DIAGNOSIS — I10 Essential (primary) hypertension: Secondary | ICD-10-CM | POA: Diagnosis not present

## 2021-09-29 DIAGNOSIS — Z992 Dependence on renal dialysis: Secondary | ICD-10-CM | POA: Diagnosis not present

## 2021-09-29 DIAGNOSIS — I129 Hypertensive chronic kidney disease with stage 1 through stage 4 chronic kidney disease, or unspecified chronic kidney disease: Secondary | ICD-10-CM | POA: Diagnosis not present

## 2021-09-29 DIAGNOSIS — E7800 Pure hypercholesterolemia, unspecified: Secondary | ICD-10-CM | POA: Diagnosis not present

## 2021-09-30 DIAGNOSIS — D689 Coagulation defect, unspecified: Secondary | ICD-10-CM | POA: Diagnosis not present

## 2021-09-30 DIAGNOSIS — Z992 Dependence on renal dialysis: Secondary | ICD-10-CM | POA: Diagnosis not present

## 2021-09-30 DIAGNOSIS — N2581 Secondary hyperparathyroidism of renal origin: Secondary | ICD-10-CM | POA: Diagnosis not present

## 2021-09-30 DIAGNOSIS — N186 End stage renal disease: Secondary | ICD-10-CM | POA: Diagnosis not present

## 2021-09-30 DIAGNOSIS — R52 Pain, unspecified: Secondary | ICD-10-CM | POA: Diagnosis not present

## 2021-10-02 DIAGNOSIS — N186 End stage renal disease: Secondary | ICD-10-CM | POA: Diagnosis not present

## 2021-10-02 DIAGNOSIS — D689 Coagulation defect, unspecified: Secondary | ICD-10-CM | POA: Diagnosis not present

## 2021-10-02 DIAGNOSIS — R52 Pain, unspecified: Secondary | ICD-10-CM | POA: Diagnosis not present

## 2021-10-02 DIAGNOSIS — Z992 Dependence on renal dialysis: Secondary | ICD-10-CM | POA: Diagnosis not present

## 2021-10-02 DIAGNOSIS — N2581 Secondary hyperparathyroidism of renal origin: Secondary | ICD-10-CM | POA: Diagnosis not present

## 2021-10-04 DIAGNOSIS — D689 Coagulation defect, unspecified: Secondary | ICD-10-CM | POA: Diagnosis not present

## 2021-10-04 DIAGNOSIS — Z992 Dependence on renal dialysis: Secondary | ICD-10-CM | POA: Diagnosis not present

## 2021-10-04 DIAGNOSIS — R52 Pain, unspecified: Secondary | ICD-10-CM | POA: Diagnosis not present

## 2021-10-04 DIAGNOSIS — N2581 Secondary hyperparathyroidism of renal origin: Secondary | ICD-10-CM | POA: Diagnosis not present

## 2021-10-04 DIAGNOSIS — N186 End stage renal disease: Secondary | ICD-10-CM | POA: Diagnosis not present

## 2021-10-07 DIAGNOSIS — Z992 Dependence on renal dialysis: Secondary | ICD-10-CM | POA: Diagnosis not present

## 2021-10-07 DIAGNOSIS — D689 Coagulation defect, unspecified: Secondary | ICD-10-CM | POA: Diagnosis not present

## 2021-10-07 DIAGNOSIS — R52 Pain, unspecified: Secondary | ICD-10-CM | POA: Diagnosis not present

## 2021-10-07 DIAGNOSIS — N2581 Secondary hyperparathyroidism of renal origin: Secondary | ICD-10-CM | POA: Diagnosis not present

## 2021-10-07 DIAGNOSIS — N186 End stage renal disease: Secondary | ICD-10-CM | POA: Diagnosis not present

## 2021-10-09 DIAGNOSIS — Z992 Dependence on renal dialysis: Secondary | ICD-10-CM | POA: Diagnosis not present

## 2021-10-09 DIAGNOSIS — R52 Pain, unspecified: Secondary | ICD-10-CM | POA: Diagnosis not present

## 2021-10-09 DIAGNOSIS — N186 End stage renal disease: Secondary | ICD-10-CM | POA: Diagnosis not present

## 2021-10-09 DIAGNOSIS — N2581 Secondary hyperparathyroidism of renal origin: Secondary | ICD-10-CM | POA: Diagnosis not present

## 2021-10-09 DIAGNOSIS — D689 Coagulation defect, unspecified: Secondary | ICD-10-CM | POA: Diagnosis not present

## 2021-10-11 DIAGNOSIS — R52 Pain, unspecified: Secondary | ICD-10-CM | POA: Diagnosis not present

## 2021-10-11 DIAGNOSIS — D689 Coagulation defect, unspecified: Secondary | ICD-10-CM | POA: Diagnosis not present

## 2021-10-11 DIAGNOSIS — N186 End stage renal disease: Secondary | ICD-10-CM | POA: Diagnosis not present

## 2021-10-11 DIAGNOSIS — N2581 Secondary hyperparathyroidism of renal origin: Secondary | ICD-10-CM | POA: Diagnosis not present

## 2021-10-11 DIAGNOSIS — Z992 Dependence on renal dialysis: Secondary | ICD-10-CM | POA: Diagnosis not present

## 2021-10-14 DIAGNOSIS — N186 End stage renal disease: Secondary | ICD-10-CM | POA: Diagnosis not present

## 2021-10-14 DIAGNOSIS — R52 Pain, unspecified: Secondary | ICD-10-CM | POA: Diagnosis not present

## 2021-10-14 DIAGNOSIS — N2581 Secondary hyperparathyroidism of renal origin: Secondary | ICD-10-CM | POA: Diagnosis not present

## 2021-10-14 DIAGNOSIS — D689 Coagulation defect, unspecified: Secondary | ICD-10-CM | POA: Diagnosis not present

## 2021-10-14 DIAGNOSIS — Z992 Dependence on renal dialysis: Secondary | ICD-10-CM | POA: Diagnosis not present

## 2021-10-16 DIAGNOSIS — D689 Coagulation defect, unspecified: Secondary | ICD-10-CM | POA: Diagnosis not present

## 2021-10-16 DIAGNOSIS — N186 End stage renal disease: Secondary | ICD-10-CM | POA: Diagnosis not present

## 2021-10-16 DIAGNOSIS — Z992 Dependence on renal dialysis: Secondary | ICD-10-CM | POA: Diagnosis not present

## 2021-10-16 DIAGNOSIS — R52 Pain, unspecified: Secondary | ICD-10-CM | POA: Diagnosis not present

## 2021-10-16 DIAGNOSIS — N2581 Secondary hyperparathyroidism of renal origin: Secondary | ICD-10-CM | POA: Diagnosis not present

## 2021-10-18 DIAGNOSIS — D689 Coagulation defect, unspecified: Secondary | ICD-10-CM | POA: Diagnosis not present

## 2021-10-18 DIAGNOSIS — R52 Pain, unspecified: Secondary | ICD-10-CM | POA: Diagnosis not present

## 2021-10-18 DIAGNOSIS — N186 End stage renal disease: Secondary | ICD-10-CM | POA: Diagnosis not present

## 2021-10-18 DIAGNOSIS — N2581 Secondary hyperparathyroidism of renal origin: Secondary | ICD-10-CM | POA: Diagnosis not present

## 2021-10-18 DIAGNOSIS — E1122 Type 2 diabetes mellitus with diabetic chronic kidney disease: Secondary | ICD-10-CM | POA: Diagnosis not present

## 2021-10-18 DIAGNOSIS — Z992 Dependence on renal dialysis: Secondary | ICD-10-CM | POA: Diagnosis not present

## 2021-10-21 DIAGNOSIS — R52 Pain, unspecified: Secondary | ICD-10-CM | POA: Diagnosis not present

## 2021-10-21 DIAGNOSIS — N186 End stage renal disease: Secondary | ICD-10-CM | POA: Diagnosis not present

## 2021-10-21 DIAGNOSIS — D689 Coagulation defect, unspecified: Secondary | ICD-10-CM | POA: Diagnosis not present

## 2021-10-21 DIAGNOSIS — N2581 Secondary hyperparathyroidism of renal origin: Secondary | ICD-10-CM | POA: Diagnosis not present

## 2021-10-21 DIAGNOSIS — Z992 Dependence on renal dialysis: Secondary | ICD-10-CM | POA: Diagnosis not present

## 2021-10-23 DIAGNOSIS — Z992 Dependence on renal dialysis: Secondary | ICD-10-CM | POA: Diagnosis not present

## 2021-10-23 DIAGNOSIS — N186 End stage renal disease: Secondary | ICD-10-CM | POA: Diagnosis not present

## 2021-10-23 DIAGNOSIS — N2581 Secondary hyperparathyroidism of renal origin: Secondary | ICD-10-CM | POA: Diagnosis not present

## 2021-10-23 DIAGNOSIS — D689 Coagulation defect, unspecified: Secondary | ICD-10-CM | POA: Diagnosis not present

## 2021-10-23 DIAGNOSIS — R52 Pain, unspecified: Secondary | ICD-10-CM | POA: Diagnosis not present

## 2021-10-25 DIAGNOSIS — D689 Coagulation defect, unspecified: Secondary | ICD-10-CM | POA: Diagnosis not present

## 2021-10-25 DIAGNOSIS — Z992 Dependence on renal dialysis: Secondary | ICD-10-CM | POA: Diagnosis not present

## 2021-10-25 DIAGNOSIS — N2581 Secondary hyperparathyroidism of renal origin: Secondary | ICD-10-CM | POA: Diagnosis not present

## 2021-10-25 DIAGNOSIS — R52 Pain, unspecified: Secondary | ICD-10-CM | POA: Diagnosis not present

## 2021-10-25 DIAGNOSIS — N186 End stage renal disease: Secondary | ICD-10-CM | POA: Diagnosis not present

## 2021-10-28 DIAGNOSIS — R52 Pain, unspecified: Secondary | ICD-10-CM | POA: Diagnosis not present

## 2021-10-28 DIAGNOSIS — D689 Coagulation defect, unspecified: Secondary | ICD-10-CM | POA: Diagnosis not present

## 2021-10-28 DIAGNOSIS — N2581 Secondary hyperparathyroidism of renal origin: Secondary | ICD-10-CM | POA: Diagnosis not present

## 2021-10-28 DIAGNOSIS — Z992 Dependence on renal dialysis: Secondary | ICD-10-CM | POA: Diagnosis not present

## 2021-10-28 DIAGNOSIS — N186 End stage renal disease: Secondary | ICD-10-CM | POA: Diagnosis not present

## 2021-10-30 DIAGNOSIS — R52 Pain, unspecified: Secondary | ICD-10-CM | POA: Diagnosis not present

## 2021-10-30 DIAGNOSIS — Z992 Dependence on renal dialysis: Secondary | ICD-10-CM | POA: Diagnosis not present

## 2021-10-30 DIAGNOSIS — I129 Hypertensive chronic kidney disease with stage 1 through stage 4 chronic kidney disease, or unspecified chronic kidney disease: Secondary | ICD-10-CM | POA: Diagnosis not present

## 2021-10-30 DIAGNOSIS — D689 Coagulation defect, unspecified: Secondary | ICD-10-CM | POA: Diagnosis not present

## 2021-10-30 DIAGNOSIS — N186 End stage renal disease: Secondary | ICD-10-CM | POA: Diagnosis not present

## 2021-10-30 DIAGNOSIS — N2581 Secondary hyperparathyroidism of renal origin: Secondary | ICD-10-CM | POA: Diagnosis not present

## 2021-11-01 DIAGNOSIS — D689 Coagulation defect, unspecified: Secondary | ICD-10-CM | POA: Diagnosis not present

## 2021-11-01 DIAGNOSIS — N2581 Secondary hyperparathyroidism of renal origin: Secondary | ICD-10-CM | POA: Diagnosis not present

## 2021-11-01 DIAGNOSIS — N186 End stage renal disease: Secondary | ICD-10-CM | POA: Diagnosis not present

## 2021-11-01 DIAGNOSIS — Z992 Dependence on renal dialysis: Secondary | ICD-10-CM | POA: Diagnosis not present

## 2021-11-04 DIAGNOSIS — Z992 Dependence on renal dialysis: Secondary | ICD-10-CM | POA: Diagnosis not present

## 2021-11-04 DIAGNOSIS — N186 End stage renal disease: Secondary | ICD-10-CM | POA: Diagnosis not present

## 2021-11-04 DIAGNOSIS — N2581 Secondary hyperparathyroidism of renal origin: Secondary | ICD-10-CM | POA: Diagnosis not present

## 2021-11-04 DIAGNOSIS — D689 Coagulation defect, unspecified: Secondary | ICD-10-CM | POA: Diagnosis not present

## 2021-11-06 DIAGNOSIS — D689 Coagulation defect, unspecified: Secondary | ICD-10-CM | POA: Diagnosis not present

## 2021-11-06 DIAGNOSIS — Z992 Dependence on renal dialysis: Secondary | ICD-10-CM | POA: Diagnosis not present

## 2021-11-06 DIAGNOSIS — N186 End stage renal disease: Secondary | ICD-10-CM | POA: Diagnosis not present

## 2021-11-06 DIAGNOSIS — N2581 Secondary hyperparathyroidism of renal origin: Secondary | ICD-10-CM | POA: Diagnosis not present

## 2021-11-08 DIAGNOSIS — Z992 Dependence on renal dialysis: Secondary | ICD-10-CM | POA: Diagnosis not present

## 2021-11-08 DIAGNOSIS — D689 Coagulation defect, unspecified: Secondary | ICD-10-CM | POA: Diagnosis not present

## 2021-11-08 DIAGNOSIS — N186 End stage renal disease: Secondary | ICD-10-CM | POA: Diagnosis not present

## 2021-11-08 DIAGNOSIS — N2581 Secondary hyperparathyroidism of renal origin: Secondary | ICD-10-CM | POA: Diagnosis not present

## 2021-11-11 DIAGNOSIS — D689 Coagulation defect, unspecified: Secondary | ICD-10-CM | POA: Diagnosis not present

## 2021-11-11 DIAGNOSIS — N2581 Secondary hyperparathyroidism of renal origin: Secondary | ICD-10-CM | POA: Diagnosis not present

## 2021-11-11 DIAGNOSIS — N186 End stage renal disease: Secondary | ICD-10-CM | POA: Diagnosis not present

## 2021-11-11 DIAGNOSIS — Z992 Dependence on renal dialysis: Secondary | ICD-10-CM | POA: Diagnosis not present

## 2021-11-13 DIAGNOSIS — Z992 Dependence on renal dialysis: Secondary | ICD-10-CM | POA: Diagnosis not present

## 2021-11-13 DIAGNOSIS — D689 Coagulation defect, unspecified: Secondary | ICD-10-CM | POA: Diagnosis not present

## 2021-11-13 DIAGNOSIS — N2581 Secondary hyperparathyroidism of renal origin: Secondary | ICD-10-CM | POA: Diagnosis not present

## 2021-11-13 DIAGNOSIS — N186 End stage renal disease: Secondary | ICD-10-CM | POA: Diagnosis not present

## 2021-11-15 DIAGNOSIS — N2581 Secondary hyperparathyroidism of renal origin: Secondary | ICD-10-CM | POA: Diagnosis not present

## 2021-11-15 DIAGNOSIS — N186 End stage renal disease: Secondary | ICD-10-CM | POA: Diagnosis not present

## 2021-11-15 DIAGNOSIS — Z992 Dependence on renal dialysis: Secondary | ICD-10-CM | POA: Diagnosis not present

## 2021-11-15 DIAGNOSIS — D689 Coagulation defect, unspecified: Secondary | ICD-10-CM | POA: Diagnosis not present

## 2021-11-18 DIAGNOSIS — N186 End stage renal disease: Secondary | ICD-10-CM | POA: Diagnosis not present

## 2021-11-18 DIAGNOSIS — E1122 Type 2 diabetes mellitus with diabetic chronic kidney disease: Secondary | ICD-10-CM | POA: Diagnosis not present

## 2021-11-18 DIAGNOSIS — N2581 Secondary hyperparathyroidism of renal origin: Secondary | ICD-10-CM | POA: Diagnosis not present

## 2021-11-18 DIAGNOSIS — D689 Coagulation defect, unspecified: Secondary | ICD-10-CM | POA: Diagnosis not present

## 2021-11-18 DIAGNOSIS — Z992 Dependence on renal dialysis: Secondary | ICD-10-CM | POA: Diagnosis not present

## 2021-11-20 DIAGNOSIS — N186 End stage renal disease: Secondary | ICD-10-CM | POA: Diagnosis not present

## 2021-11-20 DIAGNOSIS — Z992 Dependence on renal dialysis: Secondary | ICD-10-CM | POA: Diagnosis not present

## 2021-11-20 DIAGNOSIS — D689 Coagulation defect, unspecified: Secondary | ICD-10-CM | POA: Diagnosis not present

## 2021-11-20 DIAGNOSIS — N2581 Secondary hyperparathyroidism of renal origin: Secondary | ICD-10-CM | POA: Diagnosis not present

## 2021-11-22 DIAGNOSIS — D689 Coagulation defect, unspecified: Secondary | ICD-10-CM | POA: Diagnosis not present

## 2021-11-22 DIAGNOSIS — Z992 Dependence on renal dialysis: Secondary | ICD-10-CM | POA: Diagnosis not present

## 2021-11-22 DIAGNOSIS — N2581 Secondary hyperparathyroidism of renal origin: Secondary | ICD-10-CM | POA: Diagnosis not present

## 2021-11-22 DIAGNOSIS — N186 End stage renal disease: Secondary | ICD-10-CM | POA: Diagnosis not present

## 2021-11-25 DIAGNOSIS — N186 End stage renal disease: Secondary | ICD-10-CM | POA: Diagnosis not present

## 2021-11-25 DIAGNOSIS — D689 Coagulation defect, unspecified: Secondary | ICD-10-CM | POA: Diagnosis not present

## 2021-11-25 DIAGNOSIS — Z992 Dependence on renal dialysis: Secondary | ICD-10-CM | POA: Diagnosis not present

## 2021-11-25 DIAGNOSIS — N2581 Secondary hyperparathyroidism of renal origin: Secondary | ICD-10-CM | POA: Diagnosis not present

## 2021-11-27 DIAGNOSIS — N186 End stage renal disease: Secondary | ICD-10-CM | POA: Diagnosis not present

## 2021-11-27 DIAGNOSIS — N2581 Secondary hyperparathyroidism of renal origin: Secondary | ICD-10-CM | POA: Diagnosis not present

## 2021-11-27 DIAGNOSIS — D689 Coagulation defect, unspecified: Secondary | ICD-10-CM | POA: Diagnosis not present

## 2021-11-27 DIAGNOSIS — Z992 Dependence on renal dialysis: Secondary | ICD-10-CM | POA: Diagnosis not present

## 2021-11-29 DIAGNOSIS — N2581 Secondary hyperparathyroidism of renal origin: Secondary | ICD-10-CM | POA: Diagnosis not present

## 2021-11-29 DIAGNOSIS — D689 Coagulation defect, unspecified: Secondary | ICD-10-CM | POA: Diagnosis not present

## 2021-11-29 DIAGNOSIS — I129 Hypertensive chronic kidney disease with stage 1 through stage 4 chronic kidney disease, or unspecified chronic kidney disease: Secondary | ICD-10-CM | POA: Diagnosis not present

## 2021-11-29 DIAGNOSIS — Z992 Dependence on renal dialysis: Secondary | ICD-10-CM | POA: Diagnosis not present

## 2021-11-29 DIAGNOSIS — N186 End stage renal disease: Secondary | ICD-10-CM | POA: Diagnosis not present

## 2021-12-02 DIAGNOSIS — D631 Anemia in chronic kidney disease: Secondary | ICD-10-CM | POA: Diagnosis not present

## 2021-12-02 DIAGNOSIS — R52 Pain, unspecified: Secondary | ICD-10-CM | POA: Diagnosis not present

## 2021-12-02 DIAGNOSIS — E1122 Type 2 diabetes mellitus with diabetic chronic kidney disease: Secondary | ICD-10-CM | POA: Diagnosis not present

## 2021-12-02 DIAGNOSIS — Z992 Dependence on renal dialysis: Secondary | ICD-10-CM | POA: Diagnosis not present

## 2021-12-02 DIAGNOSIS — D689 Coagulation defect, unspecified: Secondary | ICD-10-CM | POA: Diagnosis not present

## 2021-12-02 DIAGNOSIS — N186 End stage renal disease: Secondary | ICD-10-CM | POA: Diagnosis not present

## 2021-12-02 DIAGNOSIS — D509 Iron deficiency anemia, unspecified: Secondary | ICD-10-CM | POA: Diagnosis not present

## 2021-12-02 DIAGNOSIS — N2581 Secondary hyperparathyroidism of renal origin: Secondary | ICD-10-CM | POA: Diagnosis not present

## 2021-12-04 DIAGNOSIS — D509 Iron deficiency anemia, unspecified: Secondary | ICD-10-CM | POA: Diagnosis not present

## 2021-12-04 DIAGNOSIS — R52 Pain, unspecified: Secondary | ICD-10-CM | POA: Diagnosis not present

## 2021-12-04 DIAGNOSIS — N186 End stage renal disease: Secondary | ICD-10-CM | POA: Diagnosis not present

## 2021-12-04 DIAGNOSIS — D689 Coagulation defect, unspecified: Secondary | ICD-10-CM | POA: Diagnosis not present

## 2021-12-04 DIAGNOSIS — Z992 Dependence on renal dialysis: Secondary | ICD-10-CM | POA: Diagnosis not present

## 2021-12-04 DIAGNOSIS — D631 Anemia in chronic kidney disease: Secondary | ICD-10-CM | POA: Diagnosis not present

## 2021-12-04 DIAGNOSIS — E1122 Type 2 diabetes mellitus with diabetic chronic kidney disease: Secondary | ICD-10-CM | POA: Diagnosis not present

## 2021-12-04 DIAGNOSIS — N2581 Secondary hyperparathyroidism of renal origin: Secondary | ICD-10-CM | POA: Diagnosis not present

## 2021-12-05 DIAGNOSIS — K5790 Diverticulosis of intestine, part unspecified, without perforation or abscess without bleeding: Secondary | ICD-10-CM | POA: Diagnosis not present

## 2021-12-05 DIAGNOSIS — K649 Unspecified hemorrhoids: Secondary | ICD-10-CM | POA: Diagnosis not present

## 2021-12-06 DIAGNOSIS — D509 Iron deficiency anemia, unspecified: Secondary | ICD-10-CM | POA: Diagnosis not present

## 2021-12-06 DIAGNOSIS — N2581 Secondary hyperparathyroidism of renal origin: Secondary | ICD-10-CM | POA: Diagnosis not present

## 2021-12-06 DIAGNOSIS — D689 Coagulation defect, unspecified: Secondary | ICD-10-CM | POA: Diagnosis not present

## 2021-12-06 DIAGNOSIS — R52 Pain, unspecified: Secondary | ICD-10-CM | POA: Diagnosis not present

## 2021-12-06 DIAGNOSIS — D631 Anemia in chronic kidney disease: Secondary | ICD-10-CM | POA: Diagnosis not present

## 2021-12-06 DIAGNOSIS — E1122 Type 2 diabetes mellitus with diabetic chronic kidney disease: Secondary | ICD-10-CM | POA: Diagnosis not present

## 2021-12-06 DIAGNOSIS — Z992 Dependence on renal dialysis: Secondary | ICD-10-CM | POA: Diagnosis not present

## 2021-12-06 DIAGNOSIS — N186 End stage renal disease: Secondary | ICD-10-CM | POA: Diagnosis not present

## 2021-12-09 DIAGNOSIS — D689 Coagulation defect, unspecified: Secondary | ICD-10-CM | POA: Diagnosis not present

## 2021-12-09 DIAGNOSIS — D509 Iron deficiency anemia, unspecified: Secondary | ICD-10-CM | POA: Diagnosis not present

## 2021-12-09 DIAGNOSIS — E1122 Type 2 diabetes mellitus with diabetic chronic kidney disease: Secondary | ICD-10-CM | POA: Diagnosis not present

## 2021-12-09 DIAGNOSIS — N186 End stage renal disease: Secondary | ICD-10-CM | POA: Diagnosis not present

## 2021-12-09 DIAGNOSIS — Z992 Dependence on renal dialysis: Secondary | ICD-10-CM | POA: Diagnosis not present

## 2021-12-09 DIAGNOSIS — R52 Pain, unspecified: Secondary | ICD-10-CM | POA: Diagnosis not present

## 2021-12-09 DIAGNOSIS — N2581 Secondary hyperparathyroidism of renal origin: Secondary | ICD-10-CM | POA: Diagnosis not present

## 2021-12-09 DIAGNOSIS — D631 Anemia in chronic kidney disease: Secondary | ICD-10-CM | POA: Diagnosis not present

## 2021-12-11 DIAGNOSIS — Z992 Dependence on renal dialysis: Secondary | ICD-10-CM | POA: Diagnosis not present

## 2021-12-11 DIAGNOSIS — D689 Coagulation defect, unspecified: Secondary | ICD-10-CM | POA: Diagnosis not present

## 2021-12-11 DIAGNOSIS — N186 End stage renal disease: Secondary | ICD-10-CM | POA: Diagnosis not present

## 2021-12-11 DIAGNOSIS — D509 Iron deficiency anemia, unspecified: Secondary | ICD-10-CM | POA: Diagnosis not present

## 2021-12-11 DIAGNOSIS — D631 Anemia in chronic kidney disease: Secondary | ICD-10-CM | POA: Diagnosis not present

## 2021-12-11 DIAGNOSIS — R52 Pain, unspecified: Secondary | ICD-10-CM | POA: Diagnosis not present

## 2021-12-11 DIAGNOSIS — E1122 Type 2 diabetes mellitus with diabetic chronic kidney disease: Secondary | ICD-10-CM | POA: Diagnosis not present

## 2021-12-11 DIAGNOSIS — N2581 Secondary hyperparathyroidism of renal origin: Secondary | ICD-10-CM | POA: Diagnosis not present

## 2021-12-13 DIAGNOSIS — R52 Pain, unspecified: Secondary | ICD-10-CM | POA: Diagnosis not present

## 2021-12-13 DIAGNOSIS — N2581 Secondary hyperparathyroidism of renal origin: Secondary | ICD-10-CM | POA: Diagnosis not present

## 2021-12-13 DIAGNOSIS — N186 End stage renal disease: Secondary | ICD-10-CM | POA: Diagnosis not present

## 2021-12-13 DIAGNOSIS — D689 Coagulation defect, unspecified: Secondary | ICD-10-CM | POA: Diagnosis not present

## 2021-12-13 DIAGNOSIS — D509 Iron deficiency anemia, unspecified: Secondary | ICD-10-CM | POA: Diagnosis not present

## 2021-12-13 DIAGNOSIS — E1122 Type 2 diabetes mellitus with diabetic chronic kidney disease: Secondary | ICD-10-CM | POA: Diagnosis not present

## 2021-12-13 DIAGNOSIS — Z992 Dependence on renal dialysis: Secondary | ICD-10-CM | POA: Diagnosis not present

## 2021-12-13 DIAGNOSIS — D631 Anemia in chronic kidney disease: Secondary | ICD-10-CM | POA: Diagnosis not present

## 2021-12-16 DIAGNOSIS — N2581 Secondary hyperparathyroidism of renal origin: Secondary | ICD-10-CM | POA: Diagnosis not present

## 2021-12-16 DIAGNOSIS — D509 Iron deficiency anemia, unspecified: Secondary | ICD-10-CM | POA: Diagnosis not present

## 2021-12-16 DIAGNOSIS — D631 Anemia in chronic kidney disease: Secondary | ICD-10-CM | POA: Diagnosis not present

## 2021-12-16 DIAGNOSIS — E1122 Type 2 diabetes mellitus with diabetic chronic kidney disease: Secondary | ICD-10-CM | POA: Diagnosis not present

## 2021-12-16 DIAGNOSIS — N186 End stage renal disease: Secondary | ICD-10-CM | POA: Diagnosis not present

## 2021-12-16 DIAGNOSIS — Z992 Dependence on renal dialysis: Secondary | ICD-10-CM | POA: Diagnosis not present

## 2021-12-16 DIAGNOSIS — D689 Coagulation defect, unspecified: Secondary | ICD-10-CM | POA: Diagnosis not present

## 2021-12-16 DIAGNOSIS — R52 Pain, unspecified: Secondary | ICD-10-CM | POA: Diagnosis not present

## 2021-12-18 DIAGNOSIS — Z992 Dependence on renal dialysis: Secondary | ICD-10-CM | POA: Diagnosis not present

## 2021-12-18 DIAGNOSIS — N186 End stage renal disease: Secondary | ICD-10-CM | POA: Diagnosis not present

## 2021-12-18 DIAGNOSIS — D689 Coagulation defect, unspecified: Secondary | ICD-10-CM | POA: Diagnosis not present

## 2021-12-18 DIAGNOSIS — R52 Pain, unspecified: Secondary | ICD-10-CM | POA: Diagnosis not present

## 2021-12-18 DIAGNOSIS — D509 Iron deficiency anemia, unspecified: Secondary | ICD-10-CM | POA: Diagnosis not present

## 2021-12-18 DIAGNOSIS — N2581 Secondary hyperparathyroidism of renal origin: Secondary | ICD-10-CM | POA: Diagnosis not present

## 2021-12-18 DIAGNOSIS — D631 Anemia in chronic kidney disease: Secondary | ICD-10-CM | POA: Diagnosis not present

## 2021-12-18 DIAGNOSIS — E1122 Type 2 diabetes mellitus with diabetic chronic kidney disease: Secondary | ICD-10-CM | POA: Diagnosis not present

## 2021-12-20 DIAGNOSIS — Z992 Dependence on renal dialysis: Secondary | ICD-10-CM | POA: Diagnosis not present

## 2021-12-20 DIAGNOSIS — D509 Iron deficiency anemia, unspecified: Secondary | ICD-10-CM | POA: Diagnosis not present

## 2021-12-20 DIAGNOSIS — N186 End stage renal disease: Secondary | ICD-10-CM | POA: Diagnosis not present

## 2021-12-20 DIAGNOSIS — D631 Anemia in chronic kidney disease: Secondary | ICD-10-CM | POA: Diagnosis not present

## 2021-12-20 DIAGNOSIS — N2581 Secondary hyperparathyroidism of renal origin: Secondary | ICD-10-CM | POA: Diagnosis not present

## 2021-12-20 DIAGNOSIS — R52 Pain, unspecified: Secondary | ICD-10-CM | POA: Diagnosis not present

## 2021-12-20 DIAGNOSIS — D689 Coagulation defect, unspecified: Secondary | ICD-10-CM | POA: Diagnosis not present

## 2021-12-20 DIAGNOSIS — E1122 Type 2 diabetes mellitus with diabetic chronic kidney disease: Secondary | ICD-10-CM | POA: Diagnosis not present

## 2021-12-22 DIAGNOSIS — N2581 Secondary hyperparathyroidism of renal origin: Secondary | ICD-10-CM | POA: Diagnosis not present

## 2021-12-22 DIAGNOSIS — R52 Pain, unspecified: Secondary | ICD-10-CM | POA: Diagnosis not present

## 2021-12-22 DIAGNOSIS — D689 Coagulation defect, unspecified: Secondary | ICD-10-CM | POA: Diagnosis not present

## 2021-12-22 DIAGNOSIS — E1122 Type 2 diabetes mellitus with diabetic chronic kidney disease: Secondary | ICD-10-CM | POA: Diagnosis not present

## 2021-12-22 DIAGNOSIS — D631 Anemia in chronic kidney disease: Secondary | ICD-10-CM | POA: Diagnosis not present

## 2021-12-22 DIAGNOSIS — N186 End stage renal disease: Secondary | ICD-10-CM | POA: Diagnosis not present

## 2021-12-22 DIAGNOSIS — D509 Iron deficiency anemia, unspecified: Secondary | ICD-10-CM | POA: Diagnosis not present

## 2021-12-22 DIAGNOSIS — Z992 Dependence on renal dialysis: Secondary | ICD-10-CM | POA: Diagnosis not present

## 2021-12-23 DIAGNOSIS — N2581 Secondary hyperparathyroidism of renal origin: Secondary | ICD-10-CM | POA: Diagnosis not present

## 2021-12-23 DIAGNOSIS — Z992 Dependence on renal dialysis: Secondary | ICD-10-CM | POA: Diagnosis not present

## 2021-12-23 DIAGNOSIS — N186 End stage renal disease: Secondary | ICD-10-CM | POA: Diagnosis not present

## 2021-12-23 DIAGNOSIS — D689 Coagulation defect, unspecified: Secondary | ICD-10-CM | POA: Diagnosis not present

## 2021-12-23 DIAGNOSIS — E1122 Type 2 diabetes mellitus with diabetic chronic kidney disease: Secondary | ICD-10-CM | POA: Diagnosis not present

## 2021-12-23 DIAGNOSIS — D509 Iron deficiency anemia, unspecified: Secondary | ICD-10-CM | POA: Diagnosis not present

## 2021-12-23 DIAGNOSIS — D631 Anemia in chronic kidney disease: Secondary | ICD-10-CM | POA: Diagnosis not present

## 2021-12-23 DIAGNOSIS — R52 Pain, unspecified: Secondary | ICD-10-CM | POA: Diagnosis not present

## 2021-12-25 DIAGNOSIS — D509 Iron deficiency anemia, unspecified: Secondary | ICD-10-CM | POA: Diagnosis not present

## 2021-12-25 DIAGNOSIS — Z992 Dependence on renal dialysis: Secondary | ICD-10-CM | POA: Diagnosis not present

## 2021-12-25 DIAGNOSIS — N2581 Secondary hyperparathyroidism of renal origin: Secondary | ICD-10-CM | POA: Diagnosis not present

## 2021-12-25 DIAGNOSIS — N186 End stage renal disease: Secondary | ICD-10-CM | POA: Diagnosis not present

## 2021-12-25 DIAGNOSIS — E1122 Type 2 diabetes mellitus with diabetic chronic kidney disease: Secondary | ICD-10-CM | POA: Diagnosis not present

## 2021-12-25 DIAGNOSIS — D631 Anemia in chronic kidney disease: Secondary | ICD-10-CM | POA: Diagnosis not present

## 2021-12-25 DIAGNOSIS — R52 Pain, unspecified: Secondary | ICD-10-CM | POA: Diagnosis not present

## 2021-12-25 DIAGNOSIS — D689 Coagulation defect, unspecified: Secondary | ICD-10-CM | POA: Diagnosis not present

## 2021-12-27 DIAGNOSIS — R52 Pain, unspecified: Secondary | ICD-10-CM | POA: Diagnosis not present

## 2021-12-27 DIAGNOSIS — N186 End stage renal disease: Secondary | ICD-10-CM | POA: Diagnosis not present

## 2021-12-27 DIAGNOSIS — N2581 Secondary hyperparathyroidism of renal origin: Secondary | ICD-10-CM | POA: Diagnosis not present

## 2021-12-27 DIAGNOSIS — E1122 Type 2 diabetes mellitus with diabetic chronic kidney disease: Secondary | ICD-10-CM | POA: Diagnosis not present

## 2021-12-27 DIAGNOSIS — D631 Anemia in chronic kidney disease: Secondary | ICD-10-CM | POA: Diagnosis not present

## 2021-12-27 DIAGNOSIS — D689 Coagulation defect, unspecified: Secondary | ICD-10-CM | POA: Diagnosis not present

## 2021-12-27 DIAGNOSIS — D509 Iron deficiency anemia, unspecified: Secondary | ICD-10-CM | POA: Diagnosis not present

## 2021-12-27 DIAGNOSIS — Z992 Dependence on renal dialysis: Secondary | ICD-10-CM | POA: Diagnosis not present

## 2021-12-30 DIAGNOSIS — E1122 Type 2 diabetes mellitus with diabetic chronic kidney disease: Secondary | ICD-10-CM | POA: Diagnosis not present

## 2021-12-30 DIAGNOSIS — I129 Hypertensive chronic kidney disease with stage 1 through stage 4 chronic kidney disease, or unspecified chronic kidney disease: Secondary | ICD-10-CM | POA: Diagnosis not present

## 2021-12-30 DIAGNOSIS — D631 Anemia in chronic kidney disease: Secondary | ICD-10-CM | POA: Diagnosis not present

## 2021-12-30 DIAGNOSIS — R52 Pain, unspecified: Secondary | ICD-10-CM | POA: Diagnosis not present

## 2021-12-30 DIAGNOSIS — N186 End stage renal disease: Secondary | ICD-10-CM | POA: Diagnosis not present

## 2021-12-30 DIAGNOSIS — D509 Iron deficiency anemia, unspecified: Secondary | ICD-10-CM | POA: Diagnosis not present

## 2021-12-30 DIAGNOSIS — Z992 Dependence on renal dialysis: Secondary | ICD-10-CM | POA: Diagnosis not present

## 2021-12-30 DIAGNOSIS — N2581 Secondary hyperparathyroidism of renal origin: Secondary | ICD-10-CM | POA: Diagnosis not present

## 2021-12-30 DIAGNOSIS — D689 Coagulation defect, unspecified: Secondary | ICD-10-CM | POA: Diagnosis not present

## 2022-01-01 DIAGNOSIS — Z992 Dependence on renal dialysis: Secondary | ICD-10-CM | POA: Diagnosis not present

## 2022-01-01 DIAGNOSIS — N2581 Secondary hyperparathyroidism of renal origin: Secondary | ICD-10-CM | POA: Diagnosis not present

## 2022-01-01 DIAGNOSIS — N186 End stage renal disease: Secondary | ICD-10-CM | POA: Diagnosis not present

## 2022-01-01 DIAGNOSIS — D689 Coagulation defect, unspecified: Secondary | ICD-10-CM | POA: Diagnosis not present

## 2022-01-03 DIAGNOSIS — N2581 Secondary hyperparathyroidism of renal origin: Secondary | ICD-10-CM | POA: Diagnosis not present

## 2022-01-03 DIAGNOSIS — Z992 Dependence on renal dialysis: Secondary | ICD-10-CM | POA: Diagnosis not present

## 2022-01-03 DIAGNOSIS — D689 Coagulation defect, unspecified: Secondary | ICD-10-CM | POA: Diagnosis not present

## 2022-01-03 DIAGNOSIS — N186 End stage renal disease: Secondary | ICD-10-CM | POA: Diagnosis not present

## 2022-01-06 DIAGNOSIS — D689 Coagulation defect, unspecified: Secondary | ICD-10-CM | POA: Diagnosis not present

## 2022-01-06 DIAGNOSIS — N186 End stage renal disease: Secondary | ICD-10-CM | POA: Diagnosis not present

## 2022-01-06 DIAGNOSIS — Z992 Dependence on renal dialysis: Secondary | ICD-10-CM | POA: Diagnosis not present

## 2022-01-06 DIAGNOSIS — N2581 Secondary hyperparathyroidism of renal origin: Secondary | ICD-10-CM | POA: Diagnosis not present

## 2022-01-08 DIAGNOSIS — N2581 Secondary hyperparathyroidism of renal origin: Secondary | ICD-10-CM | POA: Diagnosis not present

## 2022-01-08 DIAGNOSIS — N186 End stage renal disease: Secondary | ICD-10-CM | POA: Diagnosis not present

## 2022-01-08 DIAGNOSIS — D689 Coagulation defect, unspecified: Secondary | ICD-10-CM | POA: Diagnosis not present

## 2022-01-08 DIAGNOSIS — Z992 Dependence on renal dialysis: Secondary | ICD-10-CM | POA: Diagnosis not present

## 2022-01-09 DIAGNOSIS — I10 Essential (primary) hypertension: Secondary | ICD-10-CM | POA: Diagnosis not present

## 2022-01-09 DIAGNOSIS — N186 End stage renal disease: Secondary | ICD-10-CM | POA: Diagnosis not present

## 2022-01-09 DIAGNOSIS — E789 Disorder of lipoprotein metabolism, unspecified: Secondary | ICD-10-CM | POA: Diagnosis not present

## 2022-01-09 DIAGNOSIS — Z794 Long term (current) use of insulin: Secondary | ICD-10-CM | POA: Diagnosis not present

## 2022-01-09 DIAGNOSIS — E118 Type 2 diabetes mellitus with unspecified complications: Secondary | ICD-10-CM | POA: Diagnosis not present

## 2022-01-09 DIAGNOSIS — E1122 Type 2 diabetes mellitus with diabetic chronic kidney disease: Secondary | ICD-10-CM | POA: Diagnosis not present

## 2022-01-10 DIAGNOSIS — D689 Coagulation defect, unspecified: Secondary | ICD-10-CM | POA: Diagnosis not present

## 2022-01-10 DIAGNOSIS — N2581 Secondary hyperparathyroidism of renal origin: Secondary | ICD-10-CM | POA: Diagnosis not present

## 2022-01-10 DIAGNOSIS — N186 End stage renal disease: Secondary | ICD-10-CM | POA: Diagnosis not present

## 2022-01-10 DIAGNOSIS — Z992 Dependence on renal dialysis: Secondary | ICD-10-CM | POA: Diagnosis not present

## 2022-01-13 DIAGNOSIS — N186 End stage renal disease: Secondary | ICD-10-CM | POA: Diagnosis not present

## 2022-01-13 DIAGNOSIS — N2581 Secondary hyperparathyroidism of renal origin: Secondary | ICD-10-CM | POA: Diagnosis not present

## 2022-01-13 DIAGNOSIS — Z992 Dependence on renal dialysis: Secondary | ICD-10-CM | POA: Diagnosis not present

## 2022-01-13 DIAGNOSIS — D689 Coagulation defect, unspecified: Secondary | ICD-10-CM | POA: Diagnosis not present

## 2022-01-15 DIAGNOSIS — N2581 Secondary hyperparathyroidism of renal origin: Secondary | ICD-10-CM | POA: Diagnosis not present

## 2022-01-15 DIAGNOSIS — Z992 Dependence on renal dialysis: Secondary | ICD-10-CM | POA: Diagnosis not present

## 2022-01-15 DIAGNOSIS — N186 End stage renal disease: Secondary | ICD-10-CM | POA: Diagnosis not present

## 2022-01-15 DIAGNOSIS — D689 Coagulation defect, unspecified: Secondary | ICD-10-CM | POA: Diagnosis not present

## 2022-01-17 DIAGNOSIS — D689 Coagulation defect, unspecified: Secondary | ICD-10-CM | POA: Diagnosis not present

## 2022-01-17 DIAGNOSIS — N2581 Secondary hyperparathyroidism of renal origin: Secondary | ICD-10-CM | POA: Diagnosis not present

## 2022-01-17 DIAGNOSIS — Z992 Dependence on renal dialysis: Secondary | ICD-10-CM | POA: Diagnosis not present

## 2022-01-17 DIAGNOSIS — N186 End stage renal disease: Secondary | ICD-10-CM | POA: Diagnosis not present

## 2022-01-18 DIAGNOSIS — E1122 Type 2 diabetes mellitus with diabetic chronic kidney disease: Secondary | ICD-10-CM | POA: Diagnosis not present

## 2022-01-19 DIAGNOSIS — N2581 Secondary hyperparathyroidism of renal origin: Secondary | ICD-10-CM | POA: Diagnosis not present

## 2022-01-19 DIAGNOSIS — D689 Coagulation defect, unspecified: Secondary | ICD-10-CM | POA: Diagnosis not present

## 2022-01-19 DIAGNOSIS — Z992 Dependence on renal dialysis: Secondary | ICD-10-CM | POA: Diagnosis not present

## 2022-01-19 DIAGNOSIS — N186 End stage renal disease: Secondary | ICD-10-CM | POA: Diagnosis not present

## 2022-01-20 DIAGNOSIS — N2581 Secondary hyperparathyroidism of renal origin: Secondary | ICD-10-CM | POA: Diagnosis not present

## 2022-01-20 DIAGNOSIS — N186 End stage renal disease: Secondary | ICD-10-CM | POA: Diagnosis not present

## 2022-01-20 DIAGNOSIS — D689 Coagulation defect, unspecified: Secondary | ICD-10-CM | POA: Diagnosis not present

## 2022-01-20 DIAGNOSIS — Z992 Dependence on renal dialysis: Secondary | ICD-10-CM | POA: Diagnosis not present

## 2022-01-22 DIAGNOSIS — D689 Coagulation defect, unspecified: Secondary | ICD-10-CM | POA: Diagnosis not present

## 2022-01-22 DIAGNOSIS — Z992 Dependence on renal dialysis: Secondary | ICD-10-CM | POA: Diagnosis not present

## 2022-01-22 DIAGNOSIS — N2581 Secondary hyperparathyroidism of renal origin: Secondary | ICD-10-CM | POA: Diagnosis not present

## 2022-01-22 DIAGNOSIS — N186 End stage renal disease: Secondary | ICD-10-CM | POA: Diagnosis not present

## 2022-01-23 DIAGNOSIS — H35033 Hypertensive retinopathy, bilateral: Secondary | ICD-10-CM | POA: Diagnosis not present

## 2022-01-23 DIAGNOSIS — H0102B Squamous blepharitis left eye, upper and lower eyelids: Secondary | ICD-10-CM | POA: Diagnosis not present

## 2022-01-23 DIAGNOSIS — Z794 Long term (current) use of insulin: Secondary | ICD-10-CM | POA: Diagnosis not present

## 2022-01-23 DIAGNOSIS — H25813 Combined forms of age-related cataract, bilateral: Secondary | ICD-10-CM | POA: Diagnosis not present

## 2022-01-23 DIAGNOSIS — E119 Type 2 diabetes mellitus without complications: Secondary | ICD-10-CM | POA: Diagnosis not present

## 2022-01-24 DIAGNOSIS — Z992 Dependence on renal dialysis: Secondary | ICD-10-CM | POA: Diagnosis not present

## 2022-01-24 DIAGNOSIS — N2581 Secondary hyperparathyroidism of renal origin: Secondary | ICD-10-CM | POA: Diagnosis not present

## 2022-01-24 DIAGNOSIS — D689 Coagulation defect, unspecified: Secondary | ICD-10-CM | POA: Diagnosis not present

## 2022-01-24 DIAGNOSIS — N186 End stage renal disease: Secondary | ICD-10-CM | POA: Diagnosis not present

## 2022-01-27 DIAGNOSIS — D689 Coagulation defect, unspecified: Secondary | ICD-10-CM | POA: Diagnosis not present

## 2022-01-27 DIAGNOSIS — N186 End stage renal disease: Secondary | ICD-10-CM | POA: Diagnosis not present

## 2022-01-27 DIAGNOSIS — Z992 Dependence on renal dialysis: Secondary | ICD-10-CM | POA: Diagnosis not present

## 2022-01-27 DIAGNOSIS — N2581 Secondary hyperparathyroidism of renal origin: Secondary | ICD-10-CM | POA: Diagnosis not present

## 2022-01-29 DIAGNOSIS — N186 End stage renal disease: Secondary | ICD-10-CM | POA: Diagnosis not present

## 2022-01-29 DIAGNOSIS — Z992 Dependence on renal dialysis: Secondary | ICD-10-CM | POA: Diagnosis not present

## 2022-01-29 DIAGNOSIS — N2581 Secondary hyperparathyroidism of renal origin: Secondary | ICD-10-CM | POA: Diagnosis not present

## 2022-01-29 DIAGNOSIS — D689 Coagulation defect, unspecified: Secondary | ICD-10-CM | POA: Diagnosis not present

## 2022-01-30 DIAGNOSIS — I129 Hypertensive chronic kidney disease with stage 1 through stage 4 chronic kidney disease, or unspecified chronic kidney disease: Secondary | ICD-10-CM | POA: Diagnosis not present

## 2022-01-30 DIAGNOSIS — Z992 Dependence on renal dialysis: Secondary | ICD-10-CM | POA: Diagnosis not present

## 2022-01-30 DIAGNOSIS — N186 End stage renal disease: Secondary | ICD-10-CM | POA: Diagnosis not present

## 2022-01-31 DIAGNOSIS — Z992 Dependence on renal dialysis: Secondary | ICD-10-CM | POA: Diagnosis not present

## 2022-01-31 DIAGNOSIS — D689 Coagulation defect, unspecified: Secondary | ICD-10-CM | POA: Diagnosis not present

## 2022-01-31 DIAGNOSIS — N2581 Secondary hyperparathyroidism of renal origin: Secondary | ICD-10-CM | POA: Diagnosis not present

## 2022-01-31 DIAGNOSIS — N186 End stage renal disease: Secondary | ICD-10-CM | POA: Diagnosis not present

## 2022-02-03 DIAGNOSIS — Z992 Dependence on renal dialysis: Secondary | ICD-10-CM | POA: Diagnosis not present

## 2022-02-03 DIAGNOSIS — N2581 Secondary hyperparathyroidism of renal origin: Secondary | ICD-10-CM | POA: Diagnosis not present

## 2022-02-03 DIAGNOSIS — N186 End stage renal disease: Secondary | ICD-10-CM | POA: Diagnosis not present

## 2022-02-03 DIAGNOSIS — D689 Coagulation defect, unspecified: Secondary | ICD-10-CM | POA: Diagnosis not present

## 2022-02-05 DIAGNOSIS — D689 Coagulation defect, unspecified: Secondary | ICD-10-CM | POA: Diagnosis not present

## 2022-02-05 DIAGNOSIS — Z992 Dependence on renal dialysis: Secondary | ICD-10-CM | POA: Diagnosis not present

## 2022-02-05 DIAGNOSIS — N2581 Secondary hyperparathyroidism of renal origin: Secondary | ICD-10-CM | POA: Diagnosis not present

## 2022-02-05 DIAGNOSIS — N186 End stage renal disease: Secondary | ICD-10-CM | POA: Diagnosis not present

## 2022-02-07 DIAGNOSIS — N2581 Secondary hyperparathyroidism of renal origin: Secondary | ICD-10-CM | POA: Diagnosis not present

## 2022-02-07 DIAGNOSIS — Z992 Dependence on renal dialysis: Secondary | ICD-10-CM | POA: Diagnosis not present

## 2022-02-07 DIAGNOSIS — D689 Coagulation defect, unspecified: Secondary | ICD-10-CM | POA: Diagnosis not present

## 2022-02-07 DIAGNOSIS — N186 End stage renal disease: Secondary | ICD-10-CM | POA: Diagnosis not present

## 2022-02-10 DIAGNOSIS — N186 End stage renal disease: Secondary | ICD-10-CM | POA: Diagnosis not present

## 2022-02-10 DIAGNOSIS — Z992 Dependence on renal dialysis: Secondary | ICD-10-CM | POA: Diagnosis not present

## 2022-02-10 DIAGNOSIS — N2581 Secondary hyperparathyroidism of renal origin: Secondary | ICD-10-CM | POA: Diagnosis not present

## 2022-02-10 DIAGNOSIS — D689 Coagulation defect, unspecified: Secondary | ICD-10-CM | POA: Diagnosis not present

## 2022-02-11 DIAGNOSIS — Z992 Dependence on renal dialysis: Secondary | ICD-10-CM | POA: Diagnosis not present

## 2022-02-11 DIAGNOSIS — N2581 Secondary hyperparathyroidism of renal origin: Secondary | ICD-10-CM | POA: Diagnosis not present

## 2022-02-11 DIAGNOSIS — N186 End stage renal disease: Secondary | ICD-10-CM | POA: Diagnosis not present

## 2022-02-11 DIAGNOSIS — D689 Coagulation defect, unspecified: Secondary | ICD-10-CM | POA: Diagnosis not present

## 2022-02-12 DIAGNOSIS — Z992 Dependence on renal dialysis: Secondary | ICD-10-CM | POA: Diagnosis not present

## 2022-02-12 DIAGNOSIS — N186 End stage renal disease: Secondary | ICD-10-CM | POA: Diagnosis not present

## 2022-02-12 DIAGNOSIS — D689 Coagulation defect, unspecified: Secondary | ICD-10-CM | POA: Diagnosis not present

## 2022-02-12 DIAGNOSIS — N2581 Secondary hyperparathyroidism of renal origin: Secondary | ICD-10-CM | POA: Diagnosis not present

## 2022-02-14 DIAGNOSIS — N186 End stage renal disease: Secondary | ICD-10-CM | POA: Diagnosis not present

## 2022-02-14 DIAGNOSIS — Z992 Dependence on renal dialysis: Secondary | ICD-10-CM | POA: Diagnosis not present

## 2022-02-14 DIAGNOSIS — D689 Coagulation defect, unspecified: Secondary | ICD-10-CM | POA: Diagnosis not present

## 2022-02-14 DIAGNOSIS — N2581 Secondary hyperparathyroidism of renal origin: Secondary | ICD-10-CM | POA: Diagnosis not present

## 2022-02-17 DIAGNOSIS — D689 Coagulation defect, unspecified: Secondary | ICD-10-CM | POA: Diagnosis not present

## 2022-02-17 DIAGNOSIS — Z992 Dependence on renal dialysis: Secondary | ICD-10-CM | POA: Diagnosis not present

## 2022-02-17 DIAGNOSIS — N186 End stage renal disease: Secondary | ICD-10-CM | POA: Diagnosis not present

## 2022-02-17 DIAGNOSIS — N2581 Secondary hyperparathyroidism of renal origin: Secondary | ICD-10-CM | POA: Diagnosis not present

## 2022-02-18 DIAGNOSIS — E1122 Type 2 diabetes mellitus with diabetic chronic kidney disease: Secondary | ICD-10-CM | POA: Diagnosis not present

## 2022-02-19 DIAGNOSIS — N186 End stage renal disease: Secondary | ICD-10-CM | POA: Diagnosis not present

## 2022-02-19 DIAGNOSIS — Z992 Dependence on renal dialysis: Secondary | ICD-10-CM | POA: Diagnosis not present

## 2022-02-19 DIAGNOSIS — D689 Coagulation defect, unspecified: Secondary | ICD-10-CM | POA: Diagnosis not present

## 2022-02-19 DIAGNOSIS — N2581 Secondary hyperparathyroidism of renal origin: Secondary | ICD-10-CM | POA: Diagnosis not present

## 2022-02-21 DIAGNOSIS — N186 End stage renal disease: Secondary | ICD-10-CM | POA: Diagnosis not present

## 2022-02-21 DIAGNOSIS — Z992 Dependence on renal dialysis: Secondary | ICD-10-CM | POA: Diagnosis not present

## 2022-02-21 DIAGNOSIS — D689 Coagulation defect, unspecified: Secondary | ICD-10-CM | POA: Diagnosis not present

## 2022-02-21 DIAGNOSIS — N2581 Secondary hyperparathyroidism of renal origin: Secondary | ICD-10-CM | POA: Diagnosis not present

## 2022-02-24 DIAGNOSIS — Z992 Dependence on renal dialysis: Secondary | ICD-10-CM | POA: Diagnosis not present

## 2022-02-24 DIAGNOSIS — D689 Coagulation defect, unspecified: Secondary | ICD-10-CM | POA: Diagnosis not present

## 2022-02-24 DIAGNOSIS — N186 End stage renal disease: Secondary | ICD-10-CM | POA: Diagnosis not present

## 2022-02-24 DIAGNOSIS — N2581 Secondary hyperparathyroidism of renal origin: Secondary | ICD-10-CM | POA: Diagnosis not present

## 2022-02-26 DIAGNOSIS — D689 Coagulation defect, unspecified: Secondary | ICD-10-CM | POA: Diagnosis not present

## 2022-02-26 DIAGNOSIS — N186 End stage renal disease: Secondary | ICD-10-CM | POA: Diagnosis not present

## 2022-02-26 DIAGNOSIS — N2581 Secondary hyperparathyroidism of renal origin: Secondary | ICD-10-CM | POA: Diagnosis not present

## 2022-02-26 DIAGNOSIS — Z992 Dependence on renal dialysis: Secondary | ICD-10-CM | POA: Diagnosis not present

## 2022-02-28 DIAGNOSIS — N2581 Secondary hyperparathyroidism of renal origin: Secondary | ICD-10-CM | POA: Diagnosis not present

## 2022-02-28 DIAGNOSIS — D689 Coagulation defect, unspecified: Secondary | ICD-10-CM | POA: Diagnosis not present

## 2022-02-28 DIAGNOSIS — Z992 Dependence on renal dialysis: Secondary | ICD-10-CM | POA: Diagnosis not present

## 2022-02-28 DIAGNOSIS — N186 End stage renal disease: Secondary | ICD-10-CM | POA: Diagnosis not present

## 2022-03-01 DIAGNOSIS — I129 Hypertensive chronic kidney disease with stage 1 through stage 4 chronic kidney disease, or unspecified chronic kidney disease: Secondary | ICD-10-CM | POA: Diagnosis not present

## 2022-03-01 DIAGNOSIS — Z992 Dependence on renal dialysis: Secondary | ICD-10-CM | POA: Diagnosis not present

## 2022-03-01 DIAGNOSIS — N186 End stage renal disease: Secondary | ICD-10-CM | POA: Diagnosis not present

## 2022-03-03 DIAGNOSIS — D689 Coagulation defect, unspecified: Secondary | ICD-10-CM | POA: Diagnosis not present

## 2022-03-03 DIAGNOSIS — E1122 Type 2 diabetes mellitus with diabetic chronic kidney disease: Secondary | ICD-10-CM | POA: Diagnosis not present

## 2022-03-03 DIAGNOSIS — Z992 Dependence on renal dialysis: Secondary | ICD-10-CM | POA: Diagnosis not present

## 2022-03-03 DIAGNOSIS — R52 Pain, unspecified: Secondary | ICD-10-CM | POA: Diagnosis not present

## 2022-03-03 DIAGNOSIS — D631 Anemia in chronic kidney disease: Secondary | ICD-10-CM | POA: Diagnosis not present

## 2022-03-03 DIAGNOSIS — N2581 Secondary hyperparathyroidism of renal origin: Secondary | ICD-10-CM | POA: Diagnosis not present

## 2022-03-03 DIAGNOSIS — Z23 Encounter for immunization: Secondary | ICD-10-CM | POA: Diagnosis not present

## 2022-03-03 DIAGNOSIS — D509 Iron deficiency anemia, unspecified: Secondary | ICD-10-CM | POA: Diagnosis not present

## 2022-03-03 DIAGNOSIS — N186 End stage renal disease: Secondary | ICD-10-CM | POA: Diagnosis not present

## 2022-03-05 DIAGNOSIS — Z23 Encounter for immunization: Secondary | ICD-10-CM | POA: Diagnosis not present

## 2022-03-05 DIAGNOSIS — D509 Iron deficiency anemia, unspecified: Secondary | ICD-10-CM | POA: Diagnosis not present

## 2022-03-05 DIAGNOSIS — N186 End stage renal disease: Secondary | ICD-10-CM | POA: Diagnosis not present

## 2022-03-05 DIAGNOSIS — N2581 Secondary hyperparathyroidism of renal origin: Secondary | ICD-10-CM | POA: Diagnosis not present

## 2022-03-05 DIAGNOSIS — Z992 Dependence on renal dialysis: Secondary | ICD-10-CM | POA: Diagnosis not present

## 2022-03-05 DIAGNOSIS — D631 Anemia in chronic kidney disease: Secondary | ICD-10-CM | POA: Diagnosis not present

## 2022-03-05 DIAGNOSIS — D689 Coagulation defect, unspecified: Secondary | ICD-10-CM | POA: Diagnosis not present

## 2022-03-05 DIAGNOSIS — E1122 Type 2 diabetes mellitus with diabetic chronic kidney disease: Secondary | ICD-10-CM | POA: Diagnosis not present

## 2022-03-05 DIAGNOSIS — R52 Pain, unspecified: Secondary | ICD-10-CM | POA: Diagnosis not present

## 2022-03-07 DIAGNOSIS — D631 Anemia in chronic kidney disease: Secondary | ICD-10-CM | POA: Diagnosis not present

## 2022-03-07 DIAGNOSIS — Z992 Dependence on renal dialysis: Secondary | ICD-10-CM | POA: Diagnosis not present

## 2022-03-07 DIAGNOSIS — E1122 Type 2 diabetes mellitus with diabetic chronic kidney disease: Secondary | ICD-10-CM | POA: Diagnosis not present

## 2022-03-07 DIAGNOSIS — D689 Coagulation defect, unspecified: Secondary | ICD-10-CM | POA: Diagnosis not present

## 2022-03-07 DIAGNOSIS — R52 Pain, unspecified: Secondary | ICD-10-CM | POA: Diagnosis not present

## 2022-03-07 DIAGNOSIS — Z23 Encounter for immunization: Secondary | ICD-10-CM | POA: Diagnosis not present

## 2022-03-07 DIAGNOSIS — N186 End stage renal disease: Secondary | ICD-10-CM | POA: Diagnosis not present

## 2022-03-07 DIAGNOSIS — D509 Iron deficiency anemia, unspecified: Secondary | ICD-10-CM | POA: Diagnosis not present

## 2022-03-07 DIAGNOSIS — N2581 Secondary hyperparathyroidism of renal origin: Secondary | ICD-10-CM | POA: Diagnosis not present

## 2022-03-10 DIAGNOSIS — N2581 Secondary hyperparathyroidism of renal origin: Secondary | ICD-10-CM | POA: Diagnosis not present

## 2022-03-10 DIAGNOSIS — D509 Iron deficiency anemia, unspecified: Secondary | ICD-10-CM | POA: Diagnosis not present

## 2022-03-10 DIAGNOSIS — Z992 Dependence on renal dialysis: Secondary | ICD-10-CM | POA: Diagnosis not present

## 2022-03-10 DIAGNOSIS — E1122 Type 2 diabetes mellitus with diabetic chronic kidney disease: Secondary | ICD-10-CM | POA: Diagnosis not present

## 2022-03-10 DIAGNOSIS — N186 End stage renal disease: Secondary | ICD-10-CM | POA: Diagnosis not present

## 2022-03-10 DIAGNOSIS — Z23 Encounter for immunization: Secondary | ICD-10-CM | POA: Diagnosis not present

## 2022-03-10 DIAGNOSIS — D689 Coagulation defect, unspecified: Secondary | ICD-10-CM | POA: Diagnosis not present

## 2022-03-10 DIAGNOSIS — D631 Anemia in chronic kidney disease: Secondary | ICD-10-CM | POA: Diagnosis not present

## 2022-03-10 DIAGNOSIS — R52 Pain, unspecified: Secondary | ICD-10-CM | POA: Diagnosis not present

## 2022-03-12 DIAGNOSIS — D631 Anemia in chronic kidney disease: Secondary | ICD-10-CM | POA: Diagnosis not present

## 2022-03-12 DIAGNOSIS — D509 Iron deficiency anemia, unspecified: Secondary | ICD-10-CM | POA: Diagnosis not present

## 2022-03-12 DIAGNOSIS — N2581 Secondary hyperparathyroidism of renal origin: Secondary | ICD-10-CM | POA: Diagnosis not present

## 2022-03-12 DIAGNOSIS — E1122 Type 2 diabetes mellitus with diabetic chronic kidney disease: Secondary | ICD-10-CM | POA: Diagnosis not present

## 2022-03-12 DIAGNOSIS — Z23 Encounter for immunization: Secondary | ICD-10-CM | POA: Diagnosis not present

## 2022-03-12 DIAGNOSIS — Z992 Dependence on renal dialysis: Secondary | ICD-10-CM | POA: Diagnosis not present

## 2022-03-12 DIAGNOSIS — D689 Coagulation defect, unspecified: Secondary | ICD-10-CM | POA: Diagnosis not present

## 2022-03-12 DIAGNOSIS — N186 End stage renal disease: Secondary | ICD-10-CM | POA: Diagnosis not present

## 2022-03-12 DIAGNOSIS — R52 Pain, unspecified: Secondary | ICD-10-CM | POA: Diagnosis not present

## 2022-03-13 DIAGNOSIS — D689 Coagulation defect, unspecified: Secondary | ICD-10-CM | POA: Diagnosis not present

## 2022-03-13 DIAGNOSIS — Z23 Encounter for immunization: Secondary | ICD-10-CM | POA: Diagnosis not present

## 2022-03-13 DIAGNOSIS — N2581 Secondary hyperparathyroidism of renal origin: Secondary | ICD-10-CM | POA: Diagnosis not present

## 2022-03-13 DIAGNOSIS — N186 End stage renal disease: Secondary | ICD-10-CM | POA: Diagnosis not present

## 2022-03-13 DIAGNOSIS — D509 Iron deficiency anemia, unspecified: Secondary | ICD-10-CM | POA: Diagnosis not present

## 2022-03-13 DIAGNOSIS — D631 Anemia in chronic kidney disease: Secondary | ICD-10-CM | POA: Diagnosis not present

## 2022-03-13 DIAGNOSIS — E1122 Type 2 diabetes mellitus with diabetic chronic kidney disease: Secondary | ICD-10-CM | POA: Diagnosis not present

## 2022-03-13 DIAGNOSIS — R52 Pain, unspecified: Secondary | ICD-10-CM | POA: Diagnosis not present

## 2022-03-13 DIAGNOSIS — Z992 Dependence on renal dialysis: Secondary | ICD-10-CM | POA: Diagnosis not present

## 2022-03-14 DIAGNOSIS — E1122 Type 2 diabetes mellitus with diabetic chronic kidney disease: Secondary | ICD-10-CM | POA: Diagnosis not present

## 2022-03-14 DIAGNOSIS — D631 Anemia in chronic kidney disease: Secondary | ICD-10-CM | POA: Diagnosis not present

## 2022-03-14 DIAGNOSIS — Z23 Encounter for immunization: Secondary | ICD-10-CM | POA: Diagnosis not present

## 2022-03-14 DIAGNOSIS — D509 Iron deficiency anemia, unspecified: Secondary | ICD-10-CM | POA: Diagnosis not present

## 2022-03-14 DIAGNOSIS — D689 Coagulation defect, unspecified: Secondary | ICD-10-CM | POA: Diagnosis not present

## 2022-03-14 DIAGNOSIS — N2581 Secondary hyperparathyroidism of renal origin: Secondary | ICD-10-CM | POA: Diagnosis not present

## 2022-03-14 DIAGNOSIS — Z992 Dependence on renal dialysis: Secondary | ICD-10-CM | POA: Diagnosis not present

## 2022-03-14 DIAGNOSIS — R52 Pain, unspecified: Secondary | ICD-10-CM | POA: Diagnosis not present

## 2022-03-14 DIAGNOSIS — N186 End stage renal disease: Secondary | ICD-10-CM | POA: Diagnosis not present

## 2022-03-17 DIAGNOSIS — Z23 Encounter for immunization: Secondary | ICD-10-CM | POA: Diagnosis not present

## 2022-03-17 DIAGNOSIS — R52 Pain, unspecified: Secondary | ICD-10-CM | POA: Diagnosis not present

## 2022-03-17 DIAGNOSIS — N2581 Secondary hyperparathyroidism of renal origin: Secondary | ICD-10-CM | POA: Diagnosis not present

## 2022-03-17 DIAGNOSIS — E1122 Type 2 diabetes mellitus with diabetic chronic kidney disease: Secondary | ICD-10-CM | POA: Diagnosis not present

## 2022-03-17 DIAGNOSIS — Z992 Dependence on renal dialysis: Secondary | ICD-10-CM | POA: Diagnosis not present

## 2022-03-17 DIAGNOSIS — D689 Coagulation defect, unspecified: Secondary | ICD-10-CM | POA: Diagnosis not present

## 2022-03-17 DIAGNOSIS — N186 End stage renal disease: Secondary | ICD-10-CM | POA: Diagnosis not present

## 2022-03-17 DIAGNOSIS — D631 Anemia in chronic kidney disease: Secondary | ICD-10-CM | POA: Diagnosis not present

## 2022-03-17 DIAGNOSIS — D509 Iron deficiency anemia, unspecified: Secondary | ICD-10-CM | POA: Diagnosis not present

## 2022-03-19 DIAGNOSIS — R52 Pain, unspecified: Secondary | ICD-10-CM | POA: Diagnosis not present

## 2022-03-19 DIAGNOSIS — D631 Anemia in chronic kidney disease: Secondary | ICD-10-CM | POA: Diagnosis not present

## 2022-03-19 DIAGNOSIS — D509 Iron deficiency anemia, unspecified: Secondary | ICD-10-CM | POA: Diagnosis not present

## 2022-03-19 DIAGNOSIS — N186 End stage renal disease: Secondary | ICD-10-CM | POA: Diagnosis not present

## 2022-03-19 DIAGNOSIS — N2581 Secondary hyperparathyroidism of renal origin: Secondary | ICD-10-CM | POA: Diagnosis not present

## 2022-03-19 DIAGNOSIS — E1122 Type 2 diabetes mellitus with diabetic chronic kidney disease: Secondary | ICD-10-CM | POA: Diagnosis not present

## 2022-03-19 DIAGNOSIS — Z23 Encounter for immunization: Secondary | ICD-10-CM | POA: Diagnosis not present

## 2022-03-19 DIAGNOSIS — Z992 Dependence on renal dialysis: Secondary | ICD-10-CM | POA: Diagnosis not present

## 2022-03-19 DIAGNOSIS — D689 Coagulation defect, unspecified: Secondary | ICD-10-CM | POA: Diagnosis not present

## 2022-03-20 DIAGNOSIS — E1122 Type 2 diabetes mellitus with diabetic chronic kidney disease: Secondary | ICD-10-CM | POA: Diagnosis not present

## 2022-03-21 DIAGNOSIS — N2581 Secondary hyperparathyroidism of renal origin: Secondary | ICD-10-CM | POA: Diagnosis not present

## 2022-03-21 DIAGNOSIS — Z992 Dependence on renal dialysis: Secondary | ICD-10-CM | POA: Diagnosis not present

## 2022-03-21 DIAGNOSIS — N186 End stage renal disease: Secondary | ICD-10-CM | POA: Diagnosis not present

## 2022-03-21 DIAGNOSIS — D631 Anemia in chronic kidney disease: Secondary | ICD-10-CM | POA: Diagnosis not present

## 2022-03-21 DIAGNOSIS — D509 Iron deficiency anemia, unspecified: Secondary | ICD-10-CM | POA: Diagnosis not present

## 2022-03-21 DIAGNOSIS — R52 Pain, unspecified: Secondary | ICD-10-CM | POA: Diagnosis not present

## 2022-03-21 DIAGNOSIS — E1122 Type 2 diabetes mellitus with diabetic chronic kidney disease: Secondary | ICD-10-CM | POA: Diagnosis not present

## 2022-03-21 DIAGNOSIS — D689 Coagulation defect, unspecified: Secondary | ICD-10-CM | POA: Diagnosis not present

## 2022-03-21 DIAGNOSIS — Z23 Encounter for immunization: Secondary | ICD-10-CM | POA: Diagnosis not present

## 2022-03-24 DIAGNOSIS — D689 Coagulation defect, unspecified: Secondary | ICD-10-CM | POA: Diagnosis not present

## 2022-03-24 DIAGNOSIS — Z23 Encounter for immunization: Secondary | ICD-10-CM | POA: Diagnosis not present

## 2022-03-24 DIAGNOSIS — R52 Pain, unspecified: Secondary | ICD-10-CM | POA: Diagnosis not present

## 2022-03-24 DIAGNOSIS — E1122 Type 2 diabetes mellitus with diabetic chronic kidney disease: Secondary | ICD-10-CM | POA: Diagnosis not present

## 2022-03-24 DIAGNOSIS — D509 Iron deficiency anemia, unspecified: Secondary | ICD-10-CM | POA: Diagnosis not present

## 2022-03-24 DIAGNOSIS — D631 Anemia in chronic kidney disease: Secondary | ICD-10-CM | POA: Diagnosis not present

## 2022-03-24 DIAGNOSIS — N186 End stage renal disease: Secondary | ICD-10-CM | POA: Diagnosis not present

## 2022-03-24 DIAGNOSIS — N2581 Secondary hyperparathyroidism of renal origin: Secondary | ICD-10-CM | POA: Diagnosis not present

## 2022-03-24 DIAGNOSIS — Z992 Dependence on renal dialysis: Secondary | ICD-10-CM | POA: Diagnosis not present

## 2022-03-26 DIAGNOSIS — N186 End stage renal disease: Secondary | ICD-10-CM | POA: Diagnosis not present

## 2022-03-26 DIAGNOSIS — Z992 Dependence on renal dialysis: Secondary | ICD-10-CM | POA: Diagnosis not present

## 2022-03-26 DIAGNOSIS — D689 Coagulation defect, unspecified: Secondary | ICD-10-CM | POA: Diagnosis not present

## 2022-03-26 DIAGNOSIS — R52 Pain, unspecified: Secondary | ICD-10-CM | POA: Diagnosis not present

## 2022-03-26 DIAGNOSIS — D631 Anemia in chronic kidney disease: Secondary | ICD-10-CM | POA: Diagnosis not present

## 2022-03-26 DIAGNOSIS — D509 Iron deficiency anemia, unspecified: Secondary | ICD-10-CM | POA: Diagnosis not present

## 2022-03-26 DIAGNOSIS — N2581 Secondary hyperparathyroidism of renal origin: Secondary | ICD-10-CM | POA: Diagnosis not present

## 2022-03-26 DIAGNOSIS — Z23 Encounter for immunization: Secondary | ICD-10-CM | POA: Diagnosis not present

## 2022-03-26 DIAGNOSIS — E1122 Type 2 diabetes mellitus with diabetic chronic kidney disease: Secondary | ICD-10-CM | POA: Diagnosis not present

## 2022-03-27 DIAGNOSIS — L89621 Pressure ulcer of left heel, stage 1: Secondary | ICD-10-CM | POA: Diagnosis not present

## 2022-03-28 DIAGNOSIS — D689 Coagulation defect, unspecified: Secondary | ICD-10-CM | POA: Diagnosis not present

## 2022-03-28 DIAGNOSIS — R52 Pain, unspecified: Secondary | ICD-10-CM | POA: Diagnosis not present

## 2022-03-28 DIAGNOSIS — E1122 Type 2 diabetes mellitus with diabetic chronic kidney disease: Secondary | ICD-10-CM | POA: Diagnosis not present

## 2022-03-28 DIAGNOSIS — N186 End stage renal disease: Secondary | ICD-10-CM | POA: Diagnosis not present

## 2022-03-28 DIAGNOSIS — D631 Anemia in chronic kidney disease: Secondary | ICD-10-CM | POA: Diagnosis not present

## 2022-03-28 DIAGNOSIS — N2581 Secondary hyperparathyroidism of renal origin: Secondary | ICD-10-CM | POA: Diagnosis not present

## 2022-03-28 DIAGNOSIS — D509 Iron deficiency anemia, unspecified: Secondary | ICD-10-CM | POA: Diagnosis not present

## 2022-03-28 DIAGNOSIS — Z992 Dependence on renal dialysis: Secondary | ICD-10-CM | POA: Diagnosis not present

## 2022-03-28 DIAGNOSIS — Z23 Encounter for immunization: Secondary | ICD-10-CM | POA: Diagnosis not present

## 2022-03-31 DIAGNOSIS — D509 Iron deficiency anemia, unspecified: Secondary | ICD-10-CM | POA: Diagnosis not present

## 2022-03-31 DIAGNOSIS — N2581 Secondary hyperparathyroidism of renal origin: Secondary | ICD-10-CM | POA: Diagnosis not present

## 2022-03-31 DIAGNOSIS — R52 Pain, unspecified: Secondary | ICD-10-CM | POA: Diagnosis not present

## 2022-03-31 DIAGNOSIS — Z992 Dependence on renal dialysis: Secondary | ICD-10-CM | POA: Diagnosis not present

## 2022-03-31 DIAGNOSIS — D689 Coagulation defect, unspecified: Secondary | ICD-10-CM | POA: Diagnosis not present

## 2022-03-31 DIAGNOSIS — N186 End stage renal disease: Secondary | ICD-10-CM | POA: Diagnosis not present

## 2022-03-31 DIAGNOSIS — D631 Anemia in chronic kidney disease: Secondary | ICD-10-CM | POA: Diagnosis not present

## 2022-03-31 DIAGNOSIS — Z23 Encounter for immunization: Secondary | ICD-10-CM | POA: Diagnosis not present

## 2022-03-31 DIAGNOSIS — E1122 Type 2 diabetes mellitus with diabetic chronic kidney disease: Secondary | ICD-10-CM | POA: Diagnosis not present

## 2022-04-01 DIAGNOSIS — I5032 Chronic diastolic (congestive) heart failure: Secondary | ICD-10-CM | POA: Diagnosis not present

## 2022-04-01 DIAGNOSIS — Z992 Dependence on renal dialysis: Secondary | ICD-10-CM | POA: Diagnosis not present

## 2022-04-01 DIAGNOSIS — M170 Bilateral primary osteoarthritis of knee: Secondary | ICD-10-CM | POA: Diagnosis not present

## 2022-04-01 DIAGNOSIS — R296 Repeated falls: Secondary | ICD-10-CM | POA: Diagnosis not present

## 2022-04-01 DIAGNOSIS — N186 End stage renal disease: Secondary | ICD-10-CM | POA: Diagnosis not present

## 2022-04-01 DIAGNOSIS — I129 Hypertensive chronic kidney disease with stage 1 through stage 4 chronic kidney disease, or unspecified chronic kidney disease: Secondary | ICD-10-CM | POA: Diagnosis not present

## 2022-04-01 DIAGNOSIS — E1122 Type 2 diabetes mellitus with diabetic chronic kidney disease: Secondary | ICD-10-CM | POA: Diagnosis not present

## 2022-04-02 DIAGNOSIS — R52 Pain, unspecified: Secondary | ICD-10-CM | POA: Diagnosis not present

## 2022-04-02 DIAGNOSIS — N186 End stage renal disease: Secondary | ICD-10-CM | POA: Diagnosis not present

## 2022-04-02 DIAGNOSIS — N2581 Secondary hyperparathyroidism of renal origin: Secondary | ICD-10-CM | POA: Diagnosis not present

## 2022-04-02 DIAGNOSIS — Z992 Dependence on renal dialysis: Secondary | ICD-10-CM | POA: Diagnosis not present

## 2022-04-02 DIAGNOSIS — D689 Coagulation defect, unspecified: Secondary | ICD-10-CM | POA: Diagnosis not present

## 2022-04-04 DIAGNOSIS — D689 Coagulation defect, unspecified: Secondary | ICD-10-CM | POA: Diagnosis not present

## 2022-04-04 DIAGNOSIS — N186 End stage renal disease: Secondary | ICD-10-CM | POA: Diagnosis not present

## 2022-04-04 DIAGNOSIS — Z992 Dependence on renal dialysis: Secondary | ICD-10-CM | POA: Diagnosis not present

## 2022-04-04 DIAGNOSIS — R52 Pain, unspecified: Secondary | ICD-10-CM | POA: Diagnosis not present

## 2022-04-04 DIAGNOSIS — N2581 Secondary hyperparathyroidism of renal origin: Secondary | ICD-10-CM | POA: Diagnosis not present

## 2022-04-07 DIAGNOSIS — Z992 Dependence on renal dialysis: Secondary | ICD-10-CM | POA: Diagnosis not present

## 2022-04-07 DIAGNOSIS — N2581 Secondary hyperparathyroidism of renal origin: Secondary | ICD-10-CM | POA: Diagnosis not present

## 2022-04-07 DIAGNOSIS — R52 Pain, unspecified: Secondary | ICD-10-CM | POA: Diagnosis not present

## 2022-04-07 DIAGNOSIS — N186 End stage renal disease: Secondary | ICD-10-CM | POA: Diagnosis not present

## 2022-04-07 DIAGNOSIS — D689 Coagulation defect, unspecified: Secondary | ICD-10-CM | POA: Diagnosis not present

## 2022-04-09 DIAGNOSIS — D689 Coagulation defect, unspecified: Secondary | ICD-10-CM | POA: Diagnosis not present

## 2022-04-09 DIAGNOSIS — Z992 Dependence on renal dialysis: Secondary | ICD-10-CM | POA: Diagnosis not present

## 2022-04-09 DIAGNOSIS — R52 Pain, unspecified: Secondary | ICD-10-CM | POA: Diagnosis not present

## 2022-04-09 DIAGNOSIS — N2581 Secondary hyperparathyroidism of renal origin: Secondary | ICD-10-CM | POA: Diagnosis not present

## 2022-04-09 DIAGNOSIS — N186 End stage renal disease: Secondary | ICD-10-CM | POA: Diagnosis not present

## 2022-04-10 DIAGNOSIS — E1122 Type 2 diabetes mellitus with diabetic chronic kidney disease: Secondary | ICD-10-CM | POA: Diagnosis not present

## 2022-04-10 DIAGNOSIS — E118 Type 2 diabetes mellitus with unspecified complications: Secondary | ICD-10-CM | POA: Diagnosis not present

## 2022-04-10 DIAGNOSIS — I10 Essential (primary) hypertension: Secondary | ICD-10-CM | POA: Diagnosis not present

## 2022-04-10 DIAGNOSIS — E7800 Pure hypercholesterolemia, unspecified: Secondary | ICD-10-CM | POA: Diagnosis not present

## 2022-04-10 DIAGNOSIS — Z794 Long term (current) use of insulin: Secondary | ICD-10-CM | POA: Diagnosis not present

## 2022-04-10 DIAGNOSIS — N186 End stage renal disease: Secondary | ICD-10-CM | POA: Diagnosis not present

## 2022-04-11 DIAGNOSIS — N2581 Secondary hyperparathyroidism of renal origin: Secondary | ICD-10-CM | POA: Diagnosis not present

## 2022-04-11 DIAGNOSIS — D689 Coagulation defect, unspecified: Secondary | ICD-10-CM | POA: Diagnosis not present

## 2022-04-11 DIAGNOSIS — R52 Pain, unspecified: Secondary | ICD-10-CM | POA: Diagnosis not present

## 2022-04-11 DIAGNOSIS — Z992 Dependence on renal dialysis: Secondary | ICD-10-CM | POA: Diagnosis not present

## 2022-04-11 DIAGNOSIS — N186 End stage renal disease: Secondary | ICD-10-CM | POA: Diagnosis not present

## 2022-04-14 DIAGNOSIS — R52 Pain, unspecified: Secondary | ICD-10-CM | POA: Diagnosis not present

## 2022-04-14 DIAGNOSIS — Z992 Dependence on renal dialysis: Secondary | ICD-10-CM | POA: Diagnosis not present

## 2022-04-14 DIAGNOSIS — N2581 Secondary hyperparathyroidism of renal origin: Secondary | ICD-10-CM | POA: Diagnosis not present

## 2022-04-14 DIAGNOSIS — N186 End stage renal disease: Secondary | ICD-10-CM | POA: Diagnosis not present

## 2022-04-14 DIAGNOSIS — D689 Coagulation defect, unspecified: Secondary | ICD-10-CM | POA: Diagnosis not present

## 2022-04-16 DIAGNOSIS — D689 Coagulation defect, unspecified: Secondary | ICD-10-CM | POA: Diagnosis not present

## 2022-04-16 DIAGNOSIS — N2581 Secondary hyperparathyroidism of renal origin: Secondary | ICD-10-CM | POA: Diagnosis not present

## 2022-04-16 DIAGNOSIS — N186 End stage renal disease: Secondary | ICD-10-CM | POA: Diagnosis not present

## 2022-04-16 DIAGNOSIS — R52 Pain, unspecified: Secondary | ICD-10-CM | POA: Diagnosis not present

## 2022-04-16 DIAGNOSIS — Z992 Dependence on renal dialysis: Secondary | ICD-10-CM | POA: Diagnosis not present

## 2022-04-18 DIAGNOSIS — N2581 Secondary hyperparathyroidism of renal origin: Secondary | ICD-10-CM | POA: Diagnosis not present

## 2022-04-18 DIAGNOSIS — Z992 Dependence on renal dialysis: Secondary | ICD-10-CM | POA: Diagnosis not present

## 2022-04-18 DIAGNOSIS — N186 End stage renal disease: Secondary | ICD-10-CM | POA: Diagnosis not present

## 2022-04-18 DIAGNOSIS — D689 Coagulation defect, unspecified: Secondary | ICD-10-CM | POA: Diagnosis not present

## 2022-04-18 DIAGNOSIS — R52 Pain, unspecified: Secondary | ICD-10-CM | POA: Diagnosis not present

## 2022-04-20 DIAGNOSIS — Z992 Dependence on renal dialysis: Secondary | ICD-10-CM | POA: Diagnosis not present

## 2022-04-20 DIAGNOSIS — N186 End stage renal disease: Secondary | ICD-10-CM | POA: Diagnosis not present

## 2022-04-20 DIAGNOSIS — E1122 Type 2 diabetes mellitus with diabetic chronic kidney disease: Secondary | ICD-10-CM | POA: Diagnosis not present

## 2022-04-20 DIAGNOSIS — R52 Pain, unspecified: Secondary | ICD-10-CM | POA: Diagnosis not present

## 2022-04-20 DIAGNOSIS — N2581 Secondary hyperparathyroidism of renal origin: Secondary | ICD-10-CM | POA: Diagnosis not present

## 2022-04-20 DIAGNOSIS — D689 Coagulation defect, unspecified: Secondary | ICD-10-CM | POA: Diagnosis not present

## 2022-04-22 DIAGNOSIS — Z992 Dependence on renal dialysis: Secondary | ICD-10-CM | POA: Diagnosis not present

## 2022-04-22 DIAGNOSIS — N186 End stage renal disease: Secondary | ICD-10-CM | POA: Diagnosis not present

## 2022-04-22 DIAGNOSIS — D689 Coagulation defect, unspecified: Secondary | ICD-10-CM | POA: Diagnosis not present

## 2022-04-22 DIAGNOSIS — N2581 Secondary hyperparathyroidism of renal origin: Secondary | ICD-10-CM | POA: Diagnosis not present

## 2022-04-22 DIAGNOSIS — R52 Pain, unspecified: Secondary | ICD-10-CM | POA: Diagnosis not present

## 2022-04-25 DIAGNOSIS — Z992 Dependence on renal dialysis: Secondary | ICD-10-CM | POA: Diagnosis not present

## 2022-04-25 DIAGNOSIS — N186 End stage renal disease: Secondary | ICD-10-CM | POA: Diagnosis not present

## 2022-04-25 DIAGNOSIS — D689 Coagulation defect, unspecified: Secondary | ICD-10-CM | POA: Diagnosis not present

## 2022-04-25 DIAGNOSIS — R52 Pain, unspecified: Secondary | ICD-10-CM | POA: Diagnosis not present

## 2022-04-25 DIAGNOSIS — N2581 Secondary hyperparathyroidism of renal origin: Secondary | ICD-10-CM | POA: Diagnosis not present

## 2022-04-28 DIAGNOSIS — D689 Coagulation defect, unspecified: Secondary | ICD-10-CM | POA: Diagnosis not present

## 2022-04-28 DIAGNOSIS — N2581 Secondary hyperparathyroidism of renal origin: Secondary | ICD-10-CM | POA: Diagnosis not present

## 2022-04-28 DIAGNOSIS — N186 End stage renal disease: Secondary | ICD-10-CM | POA: Diagnosis not present

## 2022-04-28 DIAGNOSIS — Z992 Dependence on renal dialysis: Secondary | ICD-10-CM | POA: Diagnosis not present

## 2022-04-28 DIAGNOSIS — R52 Pain, unspecified: Secondary | ICD-10-CM | POA: Diagnosis not present

## 2022-04-30 DIAGNOSIS — R52 Pain, unspecified: Secondary | ICD-10-CM | POA: Diagnosis not present

## 2022-04-30 DIAGNOSIS — N2581 Secondary hyperparathyroidism of renal origin: Secondary | ICD-10-CM | POA: Diagnosis not present

## 2022-04-30 DIAGNOSIS — Z992 Dependence on renal dialysis: Secondary | ICD-10-CM | POA: Diagnosis not present

## 2022-04-30 DIAGNOSIS — N186 End stage renal disease: Secondary | ICD-10-CM | POA: Diagnosis not present

## 2022-04-30 DIAGNOSIS — D689 Coagulation defect, unspecified: Secondary | ICD-10-CM | POA: Diagnosis not present

## 2022-05-01 DIAGNOSIS — I129 Hypertensive chronic kidney disease with stage 1 through stage 4 chronic kidney disease, or unspecified chronic kidney disease: Secondary | ICD-10-CM | POA: Diagnosis not present

## 2022-05-01 DIAGNOSIS — Z992 Dependence on renal dialysis: Secondary | ICD-10-CM | POA: Diagnosis not present

## 2022-05-01 DIAGNOSIS — N186 End stage renal disease: Secondary | ICD-10-CM | POA: Diagnosis not present

## 2022-05-02 DIAGNOSIS — N186 End stage renal disease: Secondary | ICD-10-CM | POA: Diagnosis not present

## 2022-05-02 DIAGNOSIS — Z992 Dependence on renal dialysis: Secondary | ICD-10-CM | POA: Diagnosis not present

## 2022-05-02 DIAGNOSIS — N2581 Secondary hyperparathyroidism of renal origin: Secondary | ICD-10-CM | POA: Diagnosis not present

## 2022-05-02 DIAGNOSIS — R52 Pain, unspecified: Secondary | ICD-10-CM | POA: Diagnosis not present

## 2022-05-02 DIAGNOSIS — D689 Coagulation defect, unspecified: Secondary | ICD-10-CM | POA: Diagnosis not present

## 2022-05-05 DIAGNOSIS — Z992 Dependence on renal dialysis: Secondary | ICD-10-CM | POA: Diagnosis not present

## 2022-05-05 DIAGNOSIS — R52 Pain, unspecified: Secondary | ICD-10-CM | POA: Diagnosis not present

## 2022-05-05 DIAGNOSIS — D689 Coagulation defect, unspecified: Secondary | ICD-10-CM | POA: Diagnosis not present

## 2022-05-05 DIAGNOSIS — N186 End stage renal disease: Secondary | ICD-10-CM | POA: Diagnosis not present

## 2022-05-05 DIAGNOSIS — N2581 Secondary hyperparathyroidism of renal origin: Secondary | ICD-10-CM | POA: Diagnosis not present

## 2022-05-07 DIAGNOSIS — D689 Coagulation defect, unspecified: Secondary | ICD-10-CM | POA: Diagnosis not present

## 2022-05-07 DIAGNOSIS — N186 End stage renal disease: Secondary | ICD-10-CM | POA: Diagnosis not present

## 2022-05-07 DIAGNOSIS — R52 Pain, unspecified: Secondary | ICD-10-CM | POA: Diagnosis not present

## 2022-05-07 DIAGNOSIS — N2581 Secondary hyperparathyroidism of renal origin: Secondary | ICD-10-CM | POA: Diagnosis not present

## 2022-05-07 DIAGNOSIS — Z992 Dependence on renal dialysis: Secondary | ICD-10-CM | POA: Diagnosis not present

## 2022-05-09 DIAGNOSIS — Z992 Dependence on renal dialysis: Secondary | ICD-10-CM | POA: Diagnosis not present

## 2022-05-09 DIAGNOSIS — N186 End stage renal disease: Secondary | ICD-10-CM | POA: Diagnosis not present

## 2022-05-09 DIAGNOSIS — D689 Coagulation defect, unspecified: Secondary | ICD-10-CM | POA: Diagnosis not present

## 2022-05-09 DIAGNOSIS — R52 Pain, unspecified: Secondary | ICD-10-CM | POA: Diagnosis not present

## 2022-05-09 DIAGNOSIS — N2581 Secondary hyperparathyroidism of renal origin: Secondary | ICD-10-CM | POA: Diagnosis not present

## 2022-05-12 DIAGNOSIS — R52 Pain, unspecified: Secondary | ICD-10-CM | POA: Diagnosis not present

## 2022-05-12 DIAGNOSIS — Z992 Dependence on renal dialysis: Secondary | ICD-10-CM | POA: Diagnosis not present

## 2022-05-12 DIAGNOSIS — N186 End stage renal disease: Secondary | ICD-10-CM | POA: Diagnosis not present

## 2022-05-12 DIAGNOSIS — N2581 Secondary hyperparathyroidism of renal origin: Secondary | ICD-10-CM | POA: Diagnosis not present

## 2022-05-12 DIAGNOSIS — D689 Coagulation defect, unspecified: Secondary | ICD-10-CM | POA: Diagnosis not present

## 2022-05-14 DIAGNOSIS — D689 Coagulation defect, unspecified: Secondary | ICD-10-CM | POA: Diagnosis not present

## 2022-05-14 DIAGNOSIS — R52 Pain, unspecified: Secondary | ICD-10-CM | POA: Diagnosis not present

## 2022-05-14 DIAGNOSIS — Z992 Dependence on renal dialysis: Secondary | ICD-10-CM | POA: Diagnosis not present

## 2022-05-14 DIAGNOSIS — N186 End stage renal disease: Secondary | ICD-10-CM | POA: Diagnosis not present

## 2022-05-14 DIAGNOSIS — N2581 Secondary hyperparathyroidism of renal origin: Secondary | ICD-10-CM | POA: Diagnosis not present

## 2022-05-16 DIAGNOSIS — D689 Coagulation defect, unspecified: Secondary | ICD-10-CM | POA: Diagnosis not present

## 2022-05-16 DIAGNOSIS — R52 Pain, unspecified: Secondary | ICD-10-CM | POA: Diagnosis not present

## 2022-05-16 DIAGNOSIS — N186 End stage renal disease: Secondary | ICD-10-CM | POA: Diagnosis not present

## 2022-05-16 DIAGNOSIS — N2581 Secondary hyperparathyroidism of renal origin: Secondary | ICD-10-CM | POA: Diagnosis not present

## 2022-05-16 DIAGNOSIS — Z992 Dependence on renal dialysis: Secondary | ICD-10-CM | POA: Diagnosis not present

## 2022-05-19 DIAGNOSIS — R52 Pain, unspecified: Secondary | ICD-10-CM | POA: Diagnosis not present

## 2022-05-19 DIAGNOSIS — Z992 Dependence on renal dialysis: Secondary | ICD-10-CM | POA: Diagnosis not present

## 2022-05-19 DIAGNOSIS — N186 End stage renal disease: Secondary | ICD-10-CM | POA: Diagnosis not present

## 2022-05-19 DIAGNOSIS — N2581 Secondary hyperparathyroidism of renal origin: Secondary | ICD-10-CM | POA: Diagnosis not present

## 2022-05-19 DIAGNOSIS — D689 Coagulation defect, unspecified: Secondary | ICD-10-CM | POA: Diagnosis not present

## 2022-05-20 DIAGNOSIS — E1122 Type 2 diabetes mellitus with diabetic chronic kidney disease: Secondary | ICD-10-CM | POA: Diagnosis not present

## 2022-05-21 DIAGNOSIS — N186 End stage renal disease: Secondary | ICD-10-CM | POA: Diagnosis not present

## 2022-05-21 DIAGNOSIS — Z992 Dependence on renal dialysis: Secondary | ICD-10-CM | POA: Diagnosis not present

## 2022-05-21 DIAGNOSIS — N2581 Secondary hyperparathyroidism of renal origin: Secondary | ICD-10-CM | POA: Diagnosis not present

## 2022-05-21 DIAGNOSIS — D689 Coagulation defect, unspecified: Secondary | ICD-10-CM | POA: Diagnosis not present

## 2022-05-21 DIAGNOSIS — R52 Pain, unspecified: Secondary | ICD-10-CM | POA: Diagnosis not present

## 2022-05-23 DIAGNOSIS — N2581 Secondary hyperparathyroidism of renal origin: Secondary | ICD-10-CM | POA: Diagnosis not present

## 2022-05-23 DIAGNOSIS — N186 End stage renal disease: Secondary | ICD-10-CM | POA: Diagnosis not present

## 2022-05-23 DIAGNOSIS — D689 Coagulation defect, unspecified: Secondary | ICD-10-CM | POA: Diagnosis not present

## 2022-05-23 DIAGNOSIS — R52 Pain, unspecified: Secondary | ICD-10-CM | POA: Diagnosis not present

## 2022-05-23 DIAGNOSIS — Z992 Dependence on renal dialysis: Secondary | ICD-10-CM | POA: Diagnosis not present

## 2022-05-25 DIAGNOSIS — Z992 Dependence on renal dialysis: Secondary | ICD-10-CM | POA: Diagnosis not present

## 2022-05-25 DIAGNOSIS — D689 Coagulation defect, unspecified: Secondary | ICD-10-CM | POA: Diagnosis not present

## 2022-05-25 DIAGNOSIS — N2581 Secondary hyperparathyroidism of renal origin: Secondary | ICD-10-CM | POA: Diagnosis not present

## 2022-05-25 DIAGNOSIS — R52 Pain, unspecified: Secondary | ICD-10-CM | POA: Diagnosis not present

## 2022-05-25 DIAGNOSIS — N186 End stage renal disease: Secondary | ICD-10-CM | POA: Diagnosis not present

## 2022-05-28 DIAGNOSIS — R52 Pain, unspecified: Secondary | ICD-10-CM | POA: Diagnosis not present

## 2022-05-28 DIAGNOSIS — N186 End stage renal disease: Secondary | ICD-10-CM | POA: Diagnosis not present

## 2022-05-28 DIAGNOSIS — N2581 Secondary hyperparathyroidism of renal origin: Secondary | ICD-10-CM | POA: Diagnosis not present

## 2022-05-28 DIAGNOSIS — Z992 Dependence on renal dialysis: Secondary | ICD-10-CM | POA: Diagnosis not present

## 2022-05-28 DIAGNOSIS — D689 Coagulation defect, unspecified: Secondary | ICD-10-CM | POA: Diagnosis not present

## 2022-05-30 DIAGNOSIS — D689 Coagulation defect, unspecified: Secondary | ICD-10-CM | POA: Diagnosis not present

## 2022-05-30 DIAGNOSIS — N2581 Secondary hyperparathyroidism of renal origin: Secondary | ICD-10-CM | POA: Diagnosis not present

## 2022-05-30 DIAGNOSIS — N186 End stage renal disease: Secondary | ICD-10-CM | POA: Diagnosis not present

## 2022-05-30 DIAGNOSIS — R52 Pain, unspecified: Secondary | ICD-10-CM | POA: Diagnosis not present

## 2022-05-30 DIAGNOSIS — Z992 Dependence on renal dialysis: Secondary | ICD-10-CM | POA: Diagnosis not present

## 2022-06-01 DIAGNOSIS — I129 Hypertensive chronic kidney disease with stage 1 through stage 4 chronic kidney disease, or unspecified chronic kidney disease: Secondary | ICD-10-CM | POA: Diagnosis not present

## 2022-06-01 DIAGNOSIS — N2581 Secondary hyperparathyroidism of renal origin: Secondary | ICD-10-CM | POA: Diagnosis not present

## 2022-06-01 DIAGNOSIS — D689 Coagulation defect, unspecified: Secondary | ICD-10-CM | POA: Diagnosis not present

## 2022-06-01 DIAGNOSIS — N186 End stage renal disease: Secondary | ICD-10-CM | POA: Diagnosis not present

## 2022-06-01 DIAGNOSIS — R52 Pain, unspecified: Secondary | ICD-10-CM | POA: Diagnosis not present

## 2022-06-01 DIAGNOSIS — Z992 Dependence on renal dialysis: Secondary | ICD-10-CM | POA: Diagnosis not present

## 2022-06-04 DIAGNOSIS — N2581 Secondary hyperparathyroidism of renal origin: Secondary | ICD-10-CM | POA: Diagnosis not present

## 2022-06-04 DIAGNOSIS — E1122 Type 2 diabetes mellitus with diabetic chronic kidney disease: Secondary | ICD-10-CM | POA: Diagnosis not present

## 2022-06-04 DIAGNOSIS — Z992 Dependence on renal dialysis: Secondary | ICD-10-CM | POA: Diagnosis not present

## 2022-06-04 DIAGNOSIS — D509 Iron deficiency anemia, unspecified: Secondary | ICD-10-CM | POA: Diagnosis not present

## 2022-06-04 DIAGNOSIS — N186 End stage renal disease: Secondary | ICD-10-CM | POA: Diagnosis not present

## 2022-06-04 DIAGNOSIS — D631 Anemia in chronic kidney disease: Secondary | ICD-10-CM | POA: Diagnosis not present

## 2022-06-04 DIAGNOSIS — D689 Coagulation defect, unspecified: Secondary | ICD-10-CM | POA: Diagnosis not present

## 2022-06-06 ENCOUNTER — Other Ambulatory Visit: Payer: Self-pay | Admitting: Gastroenterology

## 2022-06-06 DIAGNOSIS — D631 Anemia in chronic kidney disease: Secondary | ICD-10-CM | POA: Diagnosis not present

## 2022-06-06 DIAGNOSIS — D689 Coagulation defect, unspecified: Secondary | ICD-10-CM | POA: Diagnosis not present

## 2022-06-06 DIAGNOSIS — N2581 Secondary hyperparathyroidism of renal origin: Secondary | ICD-10-CM | POA: Diagnosis not present

## 2022-06-06 DIAGNOSIS — N186 End stage renal disease: Secondary | ICD-10-CM | POA: Diagnosis not present

## 2022-06-06 DIAGNOSIS — D509 Iron deficiency anemia, unspecified: Secondary | ICD-10-CM | POA: Diagnosis not present

## 2022-06-06 DIAGNOSIS — E1122 Type 2 diabetes mellitus with diabetic chronic kidney disease: Secondary | ICD-10-CM | POA: Diagnosis not present

## 2022-06-06 DIAGNOSIS — Z992 Dependence on renal dialysis: Secondary | ICD-10-CM | POA: Diagnosis not present

## 2022-06-09 DIAGNOSIS — D689 Coagulation defect, unspecified: Secondary | ICD-10-CM | POA: Diagnosis not present

## 2022-06-09 DIAGNOSIS — N2581 Secondary hyperparathyroidism of renal origin: Secondary | ICD-10-CM | POA: Diagnosis not present

## 2022-06-09 DIAGNOSIS — D631 Anemia in chronic kidney disease: Secondary | ICD-10-CM | POA: Diagnosis not present

## 2022-06-09 DIAGNOSIS — Z992 Dependence on renal dialysis: Secondary | ICD-10-CM | POA: Diagnosis not present

## 2022-06-09 DIAGNOSIS — D509 Iron deficiency anemia, unspecified: Secondary | ICD-10-CM | POA: Diagnosis not present

## 2022-06-09 DIAGNOSIS — N186 End stage renal disease: Secondary | ICD-10-CM | POA: Diagnosis not present

## 2022-06-09 DIAGNOSIS — E1122 Type 2 diabetes mellitus with diabetic chronic kidney disease: Secondary | ICD-10-CM | POA: Diagnosis not present

## 2022-06-11 DIAGNOSIS — N186 End stage renal disease: Secondary | ICD-10-CM | POA: Diagnosis not present

## 2022-06-11 DIAGNOSIS — D689 Coagulation defect, unspecified: Secondary | ICD-10-CM | POA: Diagnosis not present

## 2022-06-11 DIAGNOSIS — E1122 Type 2 diabetes mellitus with diabetic chronic kidney disease: Secondary | ICD-10-CM | POA: Diagnosis not present

## 2022-06-11 DIAGNOSIS — Z992 Dependence on renal dialysis: Secondary | ICD-10-CM | POA: Diagnosis not present

## 2022-06-11 DIAGNOSIS — D509 Iron deficiency anemia, unspecified: Secondary | ICD-10-CM | POA: Diagnosis not present

## 2022-06-11 DIAGNOSIS — D631 Anemia in chronic kidney disease: Secondary | ICD-10-CM | POA: Diagnosis not present

## 2022-06-11 DIAGNOSIS — N2581 Secondary hyperparathyroidism of renal origin: Secondary | ICD-10-CM | POA: Diagnosis not present

## 2022-06-13 DIAGNOSIS — N186 End stage renal disease: Secondary | ICD-10-CM | POA: Diagnosis not present

## 2022-06-13 DIAGNOSIS — E1122 Type 2 diabetes mellitus with diabetic chronic kidney disease: Secondary | ICD-10-CM | POA: Diagnosis not present

## 2022-06-13 DIAGNOSIS — D631 Anemia in chronic kidney disease: Secondary | ICD-10-CM | POA: Diagnosis not present

## 2022-06-13 DIAGNOSIS — D689 Coagulation defect, unspecified: Secondary | ICD-10-CM | POA: Diagnosis not present

## 2022-06-13 DIAGNOSIS — N2581 Secondary hyperparathyroidism of renal origin: Secondary | ICD-10-CM | POA: Diagnosis not present

## 2022-06-13 DIAGNOSIS — D509 Iron deficiency anemia, unspecified: Secondary | ICD-10-CM | POA: Diagnosis not present

## 2022-06-13 DIAGNOSIS — Z992 Dependence on renal dialysis: Secondary | ICD-10-CM | POA: Diagnosis not present

## 2022-06-16 DIAGNOSIS — D631 Anemia in chronic kidney disease: Secondary | ICD-10-CM | POA: Diagnosis not present

## 2022-06-16 DIAGNOSIS — N186 End stage renal disease: Secondary | ICD-10-CM | POA: Diagnosis not present

## 2022-06-16 DIAGNOSIS — D689 Coagulation defect, unspecified: Secondary | ICD-10-CM | POA: Diagnosis not present

## 2022-06-16 DIAGNOSIS — N2581 Secondary hyperparathyroidism of renal origin: Secondary | ICD-10-CM | POA: Diagnosis not present

## 2022-06-16 DIAGNOSIS — E1122 Type 2 diabetes mellitus with diabetic chronic kidney disease: Secondary | ICD-10-CM | POA: Diagnosis not present

## 2022-06-16 DIAGNOSIS — Z992 Dependence on renal dialysis: Secondary | ICD-10-CM | POA: Diagnosis not present

## 2022-06-16 DIAGNOSIS — D509 Iron deficiency anemia, unspecified: Secondary | ICD-10-CM | POA: Diagnosis not present

## 2022-06-18 DIAGNOSIS — Z992 Dependence on renal dialysis: Secondary | ICD-10-CM | POA: Diagnosis not present

## 2022-06-18 DIAGNOSIS — N186 End stage renal disease: Secondary | ICD-10-CM | POA: Diagnosis not present

## 2022-06-18 DIAGNOSIS — D509 Iron deficiency anemia, unspecified: Secondary | ICD-10-CM | POA: Diagnosis not present

## 2022-06-18 DIAGNOSIS — E1122 Type 2 diabetes mellitus with diabetic chronic kidney disease: Secondary | ICD-10-CM | POA: Diagnosis not present

## 2022-06-18 DIAGNOSIS — D689 Coagulation defect, unspecified: Secondary | ICD-10-CM | POA: Diagnosis not present

## 2022-06-18 DIAGNOSIS — N2581 Secondary hyperparathyroidism of renal origin: Secondary | ICD-10-CM | POA: Diagnosis not present

## 2022-06-18 DIAGNOSIS — D631 Anemia in chronic kidney disease: Secondary | ICD-10-CM | POA: Diagnosis not present

## 2022-06-20 DIAGNOSIS — D509 Iron deficiency anemia, unspecified: Secondary | ICD-10-CM | POA: Diagnosis not present

## 2022-06-20 DIAGNOSIS — D689 Coagulation defect, unspecified: Secondary | ICD-10-CM | POA: Diagnosis not present

## 2022-06-20 DIAGNOSIS — N186 End stage renal disease: Secondary | ICD-10-CM | POA: Diagnosis not present

## 2022-06-20 DIAGNOSIS — Z992 Dependence on renal dialysis: Secondary | ICD-10-CM | POA: Diagnosis not present

## 2022-06-20 DIAGNOSIS — E1122 Type 2 diabetes mellitus with diabetic chronic kidney disease: Secondary | ICD-10-CM | POA: Diagnosis not present

## 2022-06-20 DIAGNOSIS — D631 Anemia in chronic kidney disease: Secondary | ICD-10-CM | POA: Diagnosis not present

## 2022-06-20 DIAGNOSIS — N2581 Secondary hyperparathyroidism of renal origin: Secondary | ICD-10-CM | POA: Diagnosis not present

## 2022-06-23 DIAGNOSIS — N2581 Secondary hyperparathyroidism of renal origin: Secondary | ICD-10-CM | POA: Diagnosis not present

## 2022-06-23 DIAGNOSIS — D631 Anemia in chronic kidney disease: Secondary | ICD-10-CM | POA: Diagnosis not present

## 2022-06-23 DIAGNOSIS — Z992 Dependence on renal dialysis: Secondary | ICD-10-CM | POA: Diagnosis not present

## 2022-06-23 DIAGNOSIS — D689 Coagulation defect, unspecified: Secondary | ICD-10-CM | POA: Diagnosis not present

## 2022-06-23 DIAGNOSIS — E1122 Type 2 diabetes mellitus with diabetic chronic kidney disease: Secondary | ICD-10-CM | POA: Diagnosis not present

## 2022-06-23 DIAGNOSIS — N186 End stage renal disease: Secondary | ICD-10-CM | POA: Diagnosis not present

## 2022-06-23 DIAGNOSIS — D509 Iron deficiency anemia, unspecified: Secondary | ICD-10-CM | POA: Diagnosis not present

## 2022-06-25 DIAGNOSIS — Z992 Dependence on renal dialysis: Secondary | ICD-10-CM | POA: Diagnosis not present

## 2022-06-25 DIAGNOSIS — D689 Coagulation defect, unspecified: Secondary | ICD-10-CM | POA: Diagnosis not present

## 2022-06-25 DIAGNOSIS — D509 Iron deficiency anemia, unspecified: Secondary | ICD-10-CM | POA: Diagnosis not present

## 2022-06-25 DIAGNOSIS — D631 Anemia in chronic kidney disease: Secondary | ICD-10-CM | POA: Diagnosis not present

## 2022-06-25 DIAGNOSIS — N186 End stage renal disease: Secondary | ICD-10-CM | POA: Diagnosis not present

## 2022-06-25 DIAGNOSIS — N2581 Secondary hyperparathyroidism of renal origin: Secondary | ICD-10-CM | POA: Diagnosis not present

## 2022-06-25 DIAGNOSIS — E1122 Type 2 diabetes mellitus with diabetic chronic kidney disease: Secondary | ICD-10-CM | POA: Diagnosis not present

## 2022-06-27 DIAGNOSIS — E1122 Type 2 diabetes mellitus with diabetic chronic kidney disease: Secondary | ICD-10-CM | POA: Diagnosis not present

## 2022-06-27 DIAGNOSIS — N186 End stage renal disease: Secondary | ICD-10-CM | POA: Diagnosis not present

## 2022-06-27 DIAGNOSIS — D509 Iron deficiency anemia, unspecified: Secondary | ICD-10-CM | POA: Diagnosis not present

## 2022-06-27 DIAGNOSIS — Z992 Dependence on renal dialysis: Secondary | ICD-10-CM | POA: Diagnosis not present

## 2022-06-27 DIAGNOSIS — N2581 Secondary hyperparathyroidism of renal origin: Secondary | ICD-10-CM | POA: Diagnosis not present

## 2022-06-27 DIAGNOSIS — D631 Anemia in chronic kidney disease: Secondary | ICD-10-CM | POA: Diagnosis not present

## 2022-06-27 DIAGNOSIS — D689 Coagulation defect, unspecified: Secondary | ICD-10-CM | POA: Diagnosis not present

## 2022-06-30 DIAGNOSIS — D689 Coagulation defect, unspecified: Secondary | ICD-10-CM | POA: Diagnosis not present

## 2022-06-30 DIAGNOSIS — N186 End stage renal disease: Secondary | ICD-10-CM | POA: Diagnosis not present

## 2022-06-30 DIAGNOSIS — E1122 Type 2 diabetes mellitus with diabetic chronic kidney disease: Secondary | ICD-10-CM | POA: Diagnosis not present

## 2022-06-30 DIAGNOSIS — Z992 Dependence on renal dialysis: Secondary | ICD-10-CM | POA: Diagnosis not present

## 2022-06-30 DIAGNOSIS — D509 Iron deficiency anemia, unspecified: Secondary | ICD-10-CM | POA: Diagnosis not present

## 2022-06-30 DIAGNOSIS — D631 Anemia in chronic kidney disease: Secondary | ICD-10-CM | POA: Diagnosis not present

## 2022-06-30 DIAGNOSIS — N2581 Secondary hyperparathyroidism of renal origin: Secondary | ICD-10-CM | POA: Diagnosis not present

## 2022-07-02 DIAGNOSIS — N186 End stage renal disease: Secondary | ICD-10-CM | POA: Diagnosis not present

## 2022-07-02 DIAGNOSIS — Z992 Dependence on renal dialysis: Secondary | ICD-10-CM | POA: Diagnosis not present

## 2022-07-02 DIAGNOSIS — I129 Hypertensive chronic kidney disease with stage 1 through stage 4 chronic kidney disease, or unspecified chronic kidney disease: Secondary | ICD-10-CM | POA: Diagnosis not present

## 2022-07-02 DIAGNOSIS — D689 Coagulation defect, unspecified: Secondary | ICD-10-CM | POA: Diagnosis not present

## 2022-07-02 DIAGNOSIS — D509 Iron deficiency anemia, unspecified: Secondary | ICD-10-CM | POA: Diagnosis not present

## 2022-07-02 DIAGNOSIS — N2581 Secondary hyperparathyroidism of renal origin: Secondary | ICD-10-CM | POA: Diagnosis not present

## 2022-07-02 DIAGNOSIS — E1122 Type 2 diabetes mellitus with diabetic chronic kidney disease: Secondary | ICD-10-CM | POA: Diagnosis not present

## 2022-07-02 DIAGNOSIS — D631 Anemia in chronic kidney disease: Secondary | ICD-10-CM | POA: Diagnosis not present

## 2022-07-04 DIAGNOSIS — D689 Coagulation defect, unspecified: Secondary | ICD-10-CM | POA: Diagnosis not present

## 2022-07-04 DIAGNOSIS — N186 End stage renal disease: Secondary | ICD-10-CM | POA: Diagnosis not present

## 2022-07-04 DIAGNOSIS — R52 Pain, unspecified: Secondary | ICD-10-CM | POA: Diagnosis not present

## 2022-07-04 DIAGNOSIS — D631 Anemia in chronic kidney disease: Secondary | ICD-10-CM | POA: Diagnosis not present

## 2022-07-04 DIAGNOSIS — N2581 Secondary hyperparathyroidism of renal origin: Secondary | ICD-10-CM | POA: Diagnosis not present

## 2022-07-04 DIAGNOSIS — Z992 Dependence on renal dialysis: Secondary | ICD-10-CM | POA: Diagnosis not present

## 2022-07-07 DIAGNOSIS — Z992 Dependence on renal dialysis: Secondary | ICD-10-CM | POA: Diagnosis not present

## 2022-07-07 DIAGNOSIS — D631 Anemia in chronic kidney disease: Secondary | ICD-10-CM | POA: Diagnosis not present

## 2022-07-07 DIAGNOSIS — R52 Pain, unspecified: Secondary | ICD-10-CM | POA: Diagnosis not present

## 2022-07-07 DIAGNOSIS — N186 End stage renal disease: Secondary | ICD-10-CM | POA: Diagnosis not present

## 2022-07-07 DIAGNOSIS — D689 Coagulation defect, unspecified: Secondary | ICD-10-CM | POA: Diagnosis not present

## 2022-07-07 DIAGNOSIS — N2581 Secondary hyperparathyroidism of renal origin: Secondary | ICD-10-CM | POA: Diagnosis not present

## 2022-07-09 DIAGNOSIS — Z992 Dependence on renal dialysis: Secondary | ICD-10-CM | POA: Diagnosis not present

## 2022-07-09 DIAGNOSIS — D689 Coagulation defect, unspecified: Secondary | ICD-10-CM | POA: Diagnosis not present

## 2022-07-09 DIAGNOSIS — D631 Anemia in chronic kidney disease: Secondary | ICD-10-CM | POA: Diagnosis not present

## 2022-07-09 DIAGNOSIS — R52 Pain, unspecified: Secondary | ICD-10-CM | POA: Diagnosis not present

## 2022-07-09 DIAGNOSIS — N2581 Secondary hyperparathyroidism of renal origin: Secondary | ICD-10-CM | POA: Diagnosis not present

## 2022-07-09 DIAGNOSIS — N186 End stage renal disease: Secondary | ICD-10-CM | POA: Diagnosis not present

## 2022-07-11 DIAGNOSIS — R52 Pain, unspecified: Secondary | ICD-10-CM | POA: Diagnosis not present

## 2022-07-11 DIAGNOSIS — Z992 Dependence on renal dialysis: Secondary | ICD-10-CM | POA: Diagnosis not present

## 2022-07-11 DIAGNOSIS — D689 Coagulation defect, unspecified: Secondary | ICD-10-CM | POA: Diagnosis not present

## 2022-07-11 DIAGNOSIS — D631 Anemia in chronic kidney disease: Secondary | ICD-10-CM | POA: Diagnosis not present

## 2022-07-11 DIAGNOSIS — N186 End stage renal disease: Secondary | ICD-10-CM | POA: Diagnosis not present

## 2022-07-11 DIAGNOSIS — N2581 Secondary hyperparathyroidism of renal origin: Secondary | ICD-10-CM | POA: Diagnosis not present

## 2022-07-14 DIAGNOSIS — D689 Coagulation defect, unspecified: Secondary | ICD-10-CM | POA: Diagnosis not present

## 2022-07-14 DIAGNOSIS — N186 End stage renal disease: Secondary | ICD-10-CM | POA: Diagnosis not present

## 2022-07-14 DIAGNOSIS — Z992 Dependence on renal dialysis: Secondary | ICD-10-CM | POA: Diagnosis not present

## 2022-07-14 DIAGNOSIS — D631 Anemia in chronic kidney disease: Secondary | ICD-10-CM | POA: Diagnosis not present

## 2022-07-14 DIAGNOSIS — R52 Pain, unspecified: Secondary | ICD-10-CM | POA: Diagnosis not present

## 2022-07-14 DIAGNOSIS — N2581 Secondary hyperparathyroidism of renal origin: Secondary | ICD-10-CM | POA: Diagnosis not present

## 2022-07-15 ENCOUNTER — Encounter (HOSPITAL_COMMUNITY): Payer: Self-pay | Admitting: Gastroenterology

## 2022-07-15 DIAGNOSIS — M25512 Pain in left shoulder: Secondary | ICD-10-CM | POA: Diagnosis not present

## 2022-07-15 NOTE — Progress Notes (Signed)
Attempted to obtain medical history via telephone, unable to reach at this time. HIPAA compliant voicemail message left requesting return call to pre surgical testing department. 

## 2022-07-16 DIAGNOSIS — D689 Coagulation defect, unspecified: Secondary | ICD-10-CM | POA: Diagnosis not present

## 2022-07-16 DIAGNOSIS — N2581 Secondary hyperparathyroidism of renal origin: Secondary | ICD-10-CM | POA: Diagnosis not present

## 2022-07-16 DIAGNOSIS — D631 Anemia in chronic kidney disease: Secondary | ICD-10-CM | POA: Diagnosis not present

## 2022-07-16 DIAGNOSIS — R52 Pain, unspecified: Secondary | ICD-10-CM | POA: Diagnosis not present

## 2022-07-16 DIAGNOSIS — N186 End stage renal disease: Secondary | ICD-10-CM | POA: Diagnosis not present

## 2022-07-16 DIAGNOSIS — Z992 Dependence on renal dialysis: Secondary | ICD-10-CM | POA: Diagnosis not present

## 2022-07-18 DIAGNOSIS — D689 Coagulation defect, unspecified: Secondary | ICD-10-CM | POA: Diagnosis not present

## 2022-07-18 DIAGNOSIS — D631 Anemia in chronic kidney disease: Secondary | ICD-10-CM | POA: Diagnosis not present

## 2022-07-18 DIAGNOSIS — N186 End stage renal disease: Secondary | ICD-10-CM | POA: Diagnosis not present

## 2022-07-18 DIAGNOSIS — N2581 Secondary hyperparathyroidism of renal origin: Secondary | ICD-10-CM | POA: Diagnosis not present

## 2022-07-18 DIAGNOSIS — Z992 Dependence on renal dialysis: Secondary | ICD-10-CM | POA: Diagnosis not present

## 2022-07-18 DIAGNOSIS — R52 Pain, unspecified: Secondary | ICD-10-CM | POA: Diagnosis not present

## 2022-07-21 DIAGNOSIS — D631 Anemia in chronic kidney disease: Secondary | ICD-10-CM | POA: Diagnosis not present

## 2022-07-21 DIAGNOSIS — R52 Pain, unspecified: Secondary | ICD-10-CM | POA: Diagnosis not present

## 2022-07-21 DIAGNOSIS — Z992 Dependence on renal dialysis: Secondary | ICD-10-CM | POA: Diagnosis not present

## 2022-07-21 DIAGNOSIS — N2581 Secondary hyperparathyroidism of renal origin: Secondary | ICD-10-CM | POA: Diagnosis not present

## 2022-07-21 DIAGNOSIS — E1122 Type 2 diabetes mellitus with diabetic chronic kidney disease: Secondary | ICD-10-CM | POA: Diagnosis not present

## 2022-07-21 DIAGNOSIS — N186 End stage renal disease: Secondary | ICD-10-CM | POA: Diagnosis not present

## 2022-07-21 DIAGNOSIS — D689 Coagulation defect, unspecified: Secondary | ICD-10-CM | POA: Diagnosis not present

## 2022-07-23 ENCOUNTER — Ambulatory Visit (HOSPITAL_COMMUNITY)
Admission: RE | Admit: 2022-07-23 | Discharge: 2022-07-23 | Disposition: A | Discharge status: Home / Self Care | Payer: Medicare Other | Attending: Gastroenterology | Admitting: Gastroenterology

## 2022-07-23 ENCOUNTER — Other Ambulatory Visit: Payer: Self-pay

## 2022-07-23 ENCOUNTER — Encounter (HOSPITAL_COMMUNITY)
Admission: RE | Disposition: A | Discharge status: Home / Self Care | Payer: Self-pay | Source: Home / Self Care | Attending: Gastroenterology

## 2022-07-23 ENCOUNTER — Ambulatory Visit (HOSPITAL_BASED_OUTPATIENT_CLINIC_OR_DEPARTMENT_OTHER): Payer: Medicare Other | Admitting: Certified Registered Nurse Anesthetist

## 2022-07-23 ENCOUNTER — Ambulatory Visit (HOSPITAL_COMMUNITY): Payer: Medicare Other | Admitting: Certified Registered Nurse Anesthetist

## 2022-07-23 DIAGNOSIS — N186 End stage renal disease: Secondary | ICD-10-CM | POA: Insufficient documentation

## 2022-07-23 DIAGNOSIS — Z09 Encounter for follow-up examination after completed treatment for conditions other than malignant neoplasm: Secondary | ICD-10-CM | POA: Diagnosis not present

## 2022-07-23 DIAGNOSIS — K5730 Diverticulosis of large intestine without perforation or abscess without bleeding: Secondary | ICD-10-CM | POA: Insufficient documentation

## 2022-07-23 DIAGNOSIS — I5032 Chronic diastolic (congestive) heart failure: Secondary | ICD-10-CM | POA: Diagnosis not present

## 2022-07-23 DIAGNOSIS — D120 Benign neoplasm of cecum: Secondary | ICD-10-CM | POA: Insufficient documentation

## 2022-07-23 DIAGNOSIS — Z794 Long term (current) use of insulin: Secondary | ICD-10-CM | POA: Insufficient documentation

## 2022-07-23 DIAGNOSIS — D122 Benign neoplasm of ascending colon: Secondary | ICD-10-CM | POA: Insufficient documentation

## 2022-07-23 DIAGNOSIS — Z86010 Personal history of colonic polyps: Secondary | ICD-10-CM | POA: Diagnosis not present

## 2022-07-23 DIAGNOSIS — G4730 Sleep apnea, unspecified: Secondary | ICD-10-CM | POA: Insufficient documentation

## 2022-07-23 DIAGNOSIS — E785 Hyperlipidemia, unspecified: Secondary | ICD-10-CM | POA: Insufficient documentation

## 2022-07-23 DIAGNOSIS — I132 Hypertensive heart and chronic kidney disease with heart failure and with stage 5 chronic kidney disease, or end stage renal disease: Secondary | ICD-10-CM | POA: Insufficient documentation

## 2022-07-23 DIAGNOSIS — Z79899 Other long term (current) drug therapy: Secondary | ICD-10-CM | POA: Diagnosis not present

## 2022-07-23 DIAGNOSIS — I10 Essential (primary) hypertension: Secondary | ICD-10-CM

## 2022-07-23 DIAGNOSIS — E1122 Type 2 diabetes mellitus with diabetic chronic kidney disease: Secondary | ICD-10-CM | POA: Diagnosis not present

## 2022-07-23 DIAGNOSIS — Z7984 Long term (current) use of oral hypoglycemic drugs: Secondary | ICD-10-CM | POA: Diagnosis not present

## 2022-07-23 DIAGNOSIS — E119 Type 2 diabetes mellitus without complications: Secondary | ICD-10-CM | POA: Diagnosis not present

## 2022-07-23 HISTORY — PX: HEMOSTASIS CLIP PLACEMENT: SHX6857

## 2022-07-23 HISTORY — PX: COLONOSCOPY WITH PROPOFOL: SHX5780

## 2022-07-23 HISTORY — PX: POLYPECTOMY: SHX5525

## 2022-07-23 LAB — GLUCOSE, CAPILLARY
Glucose-Capillary: 117 mg/dL — ABNORMAL HIGH (ref 70–99)
Glucose-Capillary: 94 mg/dL (ref 70–99)

## 2022-07-23 SURGERY — COLONOSCOPY WITH PROPOFOL
Anesthesia: Monitor Anesthesia Care | Laterality: Bilateral

## 2022-07-23 MED ORDER — PROPOFOL 10 MG/ML IV BOLUS
INTRAVENOUS | Status: DC | PRN
  Administered 2022-07-23: 30 mg via INTRAVENOUS

## 2022-07-23 MED ORDER — SODIUM CHLORIDE 0.9 % IV SOLN: INTRAVENOUS | Status: DC | PRN

## 2022-07-23 MED ORDER — PROPOFOL 500 MG/50ML IV EMUL
INTRAVENOUS | Status: DC | PRN
  Administered 2022-07-23: 125 ug/kg/min via INTRAVENOUS

## 2022-07-23 MED ORDER — PROPOFOL 1000 MG/100ML IV EMUL
INTRAVENOUS | Status: AC
  Filled 2022-07-23: qty 100

## 2022-07-23 MED ORDER — LIDOCAINE 2% (20 MG/ML) 5 ML SYRINGE
INTRAMUSCULAR | Status: DC | PRN
  Administered 2022-07-23: 50 mg via INTRAVENOUS

## 2022-07-23 SURGICAL SUPPLY — 22 items

## 2022-07-23 NOTE — Anesthesia Postprocedure Evaluation (Signed)
Anesthesia Post Note  Patient: [name]  Procedure(s) Performed: COLONOSCOPY WITH PROPOFOL (Bilateral) HEMOSTASIS CLIP PLACEMENT POLYPECTOMY     Patient location during evaluation: Endoscopy Anesthesia Type: MAC Level of consciousness: awake Pain management: pain level controlled Vital Signs Assessment: post-procedure vital signs reviewed and stable Respiratory status: spontaneous breathing Cardiovascular status: stable Postop Assessment: no apparent nausea or vomiting Anesthetic complications: no  No notable events documented.  Last Vitals:  Vitals:   07/23/22 1126 07/23/22 1130  BP:  (!) 119/50  Pulse: 93 93  Resp: 19 19  Temp:  (!) 36.3 C  SpO2: 94% 91%    Last Pain:  Vitals:   07/23/22 1130  TempSrc: Temporal  PainSc: [place] Jr

## 2022-07-23 NOTE — Discharge Instructions (Signed)
Colonoscopy ° °Post procedure instructions: ° °Read the instructions outlined below and refer to this sheet in the next few weeks. These discharge instructions provide you with general information on caring for yourself after you leave the hospital. Your doctor may also give you specific instructions. While your treatment has been planned according to the most current medical practices available, unavoidable complications occasionally occur. If you have any problems or questions after discharge, call Dr. Tikesha Mort at [facility] ([phone]). ° °HOME CARE INSTRUCTIONS ° °ACTIVITY: °· You may resume your regular activity, but move at a slower pace for the next 24 hours.  °· Take frequent rest periods for the next 24 hours.  °· Walking will help get rid of the air and reduce the bloated feeling in your belly (abdomen).  °· No driving for 24 hours (because of the medicine (anesthesia) used during the test).  °· You may shower.  °· Do not sign any important legal documents or operate any machinery for 24 hours (because of the anesthesia used during the test).  °NUTRITION: °· Drink plenty of fluids.  °· You may resume your normal diet as instructed by your doctor.  °· Begin with a light meal and progress to your normal diet. Heavy or fried foods are harder to digest and may make you feel sick to your stomach (nauseated).  °· Avoid alcoholic beverages for 24 hours or as instructed.  °MEDICATIONS: °· You may resume your normal medications unless your doctor tells you otherwise.  °WHAT TO EXPECT TODAY: °· Some feelings of bloating in the abdomen.  °· Passage of more gas than usual.  °· Spotting of blood in your stool or on the toilet paper.  °IF YOU HAD POLYPS REMOVED DURING THE COLONOSCOPY: °· No aspirin products for 7 days or as instructed.  °· No alcohol for 7 days or as instructed.  °· Eat a soft diet for the next 24 hours.  ° °FINDING OUT THE RESULTS OF YOUR TEST ° °Not all test results are available during your  visit. If your test results are not back during the visit, make an appointment with your caregiver to find out the results. Do not assume everything is normal if you have not heard from your caregiver or the medical facility. It is important for you to follow up on all of your test results.  ° ° ° °SEEK IMMEDIATE MEDICAL CARE IF: ° °· You have more than a spotting of blood in your stool.  °· Your belly is swollen (abdominal distention).  °· You are nauseated or vomiting.  °· You have a fever.  °· You have abdominal pain or discomfort that is severe or gets worse throughout the day.  ° ° °Document Released: 01/01/2004 Document Revised: 01/29/2011 Document Reviewed: 12/30/2007 °ExitCare® Patient Information ©2012 [employer]. ° °

## 2022-07-23 NOTE — Transfer of Care (Signed)
Immediate Anesthesia Transfer of Care Note  Patient: [name]  Procedure(s) Performed: COLONOSCOPY WITH PROPOFOL (Bilateral) HEMOSTASIS CLIP PLACEMENT POLYPECTOMY  Patient Location: Endoscopy Unit  Anesthesia Type:MAC  Level of Consciousness: awake and patient cooperative  Airway & Oxygen Therapy: Patient Spontanous Breathing and Patient connected to face mask  Post-op Assessment: Report given to RN and Post -op Vital signs reviewed and stable  Post vital signs: Reviewed and stable  Last Vitals:  Vitals Value Taken Time  BP    Temp    Pulse    Resp    SpO2      Last Pain:  Vitals:   07/23/22 0919  TempSrc: Temporal  PainSc: 7          Complications: No notable events documented.

## 2022-07-23 NOTE — Anesthesia Preprocedure Evaluation (Signed)
Anesthesia Evaluation  Patient identified by MRN, date of birth, ID band Patient awake    Reviewed: Allergy & Precautions, NPO status , Patient's Chart, lab work & pertinent test results  Airway Mallampati: II  TM Distance: >3 FB Neck ROM: Full    Dental no notable dental hx.    Pulmonary sleep apnea    Pulmonary exam normal        Cardiovascular hypertension, Pt. on medications and Pt. on home beta blockers Normal cardiovascular exam     Neuro/Psych    GI/Hepatic Neg liver ROS,,,  Endo/Other  diabetes, Type 2, Oral Hypoglycemic Agents, Insulin Dependent  Morbid obesity  Renal/GU Renal InsufficiencyRenal disease     Musculoskeletal   Abdominal  (+) + obese  Peds  Hematology   Anesthesia Other Findings Conclusions   - Left ventricle: The cavity size was normal. Wall thickness was    increased in a pattern of severe LVH. Systolic function was    normal. The estimated ejection fraction was in the range of 55%    to 60%.  - Aortic valve: There was very mild stenosis.  - Left atrium: The atrium was moderately dilated.  - Right ventricle: Catheter in RV  - Atrial septum: No defect or patent foramen ovale was identified.  - Pulmonary arteries: PA peak pressure: 37 mm Hg (S).     Reproductive/Obstetrics                             Anesthesia Physical Anesthesia Plan  ASA: III  Anesthesia Plan: MAC   Post-op Pain Management:    Induction: Intravenous  PONV Risk Score and Plan: 2 and Treatment may vary due to age or medical condition  Airway Management Planned: Simple Face Mask and Natural Airway  Additional Equipment: None  Intra-op Plan:   Post-operative Plan:   Informed Consent: I have reviewed the patients History and Physical, chart, labs and discussed the procedure including the risks, benefits and alternatives for the proposed anesthesia with the patient or authorized  representative who has indicated his/her understanding and acceptance.       Plan Discussed with: CRNA  Anesthesia Plan Comments:         Anesthesia Quick Evaluation

## 2022-07-23 NOTE — Consult Note (Signed)
[facility] Admission Note  Chief Complaint: personal history of colonic polyps  HPI: [name] is an 67 y.o. female.  Here for surveillance colonoscopy.  BMI over 50.  Multiple comorbidities.  No abdominal pain, change in bowel habits, blood in stool.  Last colonoscopy 2019 showed couple sub-cm polyps (removed) and fair prep.  Past Medical History:  Diagnosis Date   Arthritis    Chronic diastolic CHF (congestive heart failure) ([place])    a. Echo 5/17: severe LVH, EF 55-60%, mild AS, mean AV gradient 10 mmHg, mod LAE, PASP 37 mmHg   Diabetes mellitus without complication ([facility])    type 2   ESRD (end stage renal disease) ([facility])    Tues, Thurs, Sat dialysis   H/O blood clots    many years ago in leg   History of pneumonia    HLD (hyperlipidemia)    Hypertension    BP low after dialysis started, no further BP meds   Seizures ([place])    had one or two (more than 10-15 years ago)   Sleep apnea    uses c-pap most of time    Past Surgical History:  Procedure Laterality Date   ABDOMINAL HYSTERECTOMY     AV FISTULA PLACEMENT Left 07/25/2015   Procedure: ARTERIOVENOUS (AV) FISTULA CREATION -LEFT BRACHIOCEPHALIC;  Surgeon: Conrad [place], MD;  Location: [place];  Service: Vascular;  Laterality: Left;   COLONOSCOPY     COLONOSCOPY WITH PROPOFOL N/A 11/18/2017   Procedure: COLONOSCOPY WITH PROPOFOL;  Surgeon: Arta Silence, MD;  [facility];  Service: Endoscopy;  Laterality: N/A;   FISTULA SUPERFICIALIZATION Left 11/01/2015   Procedure: SUPERFICIALIZATION OF LEFT ARM BRACHIOCEPHALIC FISTULA ;  Surgeon: Conrad [place], MD;  Location: [place];  Service: Vascular;  Laterality: Left;   INSERTION OF DIALYSIS CATHETER Right 10/09/2015   Procedure: INSERTION OF DIALYSIS CATHETER RIGHT INTERNAL JUGULAR ;  Surgeon: Angelia Mould, MD;  Location: [place];  Service: Vascular;  Laterality: Right;   POLYPECTOMY  11/18/2017   Procedure: POLYPECTOMY;  Surgeon: Arta Silence, MD;   [facility];  Service: Endoscopy;;    Medications Prior to Admission  Medication Sig Dispense Refill   acetaminophen (TYLENOL) 500 MG tablet Take 500 mg by mouth every 6 (six) hours as needed for moderate pain.     aspirin 81 MG tablet Take 81 mg by mouth daily.     atorvastatin (LIPITOR) 40 MG tablet Take 40 mg by mouth daily.     cinacalcet (SENSIPAR) 60 MG tablet Take 60 mg by mouth every other day.     febuxostat (ULORIC) 40 MG tablet Take 40 mg by mouth daily.     FOSRENOL 1000 MG chewable tablet Chew 1,000 mg by mouth 3 (three) times daily with meals.     HUMALOG KWIKPEN 100 UNIT/ML KwikPen Inject 16 Units into the skin 2 (two) times daily with a meal.     lidocaine-prilocaine (EMLA) cream Apply 1 application topically as needed (local anesthesia).     metoprolol tartrate (LOPRESSOR) 25 MG tablet Take 12.5 mg by mouth every other day.     multivitamin (RENA-VIT) TABS tablet Take 1 tablet by mouth daily. 30 each 0   senna-docusate (SENOKOT-S) 8.6-50 MG tablet Take 2 tablets by mouth 2 (two) times daily.     TRADJENTA 5 MG TABS tablet Take 5 mg by mouth daily.     TRESIBA FLEXTOUCH 100 UNIT/ML SOPN FlexTouch Pen Inject 18 Units into the skin daily as needed (  high blood sugar). Dinner  0   trolamine salicylate (ASPERCREME) 10 % cream Apply 1 Application topically as needed for muscle pain.     ONETOUCH VERIO test strip TEST 2 TIMES A DAY AND AS NEEDED FOR BLOOD SUGAR  0    Allergies:  Allergies  Allergen Reactions   Morphine And Related Other (See Comments)    Per patient, morphine causes seizures    Family History  Problem Relation Age of Onset   Cancer Mother    Hypertension Father    Heart attack Sister    Hypertension Sister    Heart attack Paternal Aunt    Hypertension Brother    Hypertension Sister    Hypertension Sister    Hypertension Sister    Hypertension Brother    Stroke Neg Hx    Breast cancer Neg Hx     Social History:  reports that she has  never smoked. She has never used smokeless tobacco. She reports that she does not drink alcohol and does not use drugs.   ROS: As per HPI, all others negative   Blood pressure 139/68, pulse 65, temperature 97.7 F (36.5 C), temperature source Temporal, resp. rate 16, weight (!) 149.7 kg, SpO2 98 %. General appearance: NAD, overweight NECK:  Thick, supple CV:  Regular LUNGS:  No respiratory distress ABD:  Soft, non-tender, protuberant NEURO:  A/O, no encephalopathy MSK:  Left > right shoulder discomfort (chronic)  Results for orders placed or performed during the hospital encounter of 07/23/22 (from the past 48 hour(s))  Glucose, capillary     Status: Abnormal   Collection Time: 07/23/22  9:36 AM  Result Value Ref Range   Glucose-Capillary 117 (H) 70 - 99 mg/dL    Comment: Glucose reference range applies only to samples taken after fasting for at least 8 hours.   No results found.  Assessment/Plan   Personal history of colonic polyps. Colonoscopy. Risks (bleeding, infection, bowel perforation that could require surgery, sedation-related changes in cardiopulmonary systems), benefits (identification and possible treatment of source of symptoms, exclusion of certain causes of symptoms), and alternatives (watchful waiting, radiographic imaging studies, empiric medical treatment) of colonoscopy were explained to patient/family in detail and patient wishes to proceed.   Landry Dyke 07/23/2022, 10:24 AM

## 2022-07-23 NOTE — Op Note (Signed)
[facility] Patient Name: [name] Procedure Date: 07/23/2022 MRN: [id number] Attending MD: Arta Silence , MD, [id number] Date of Birth: 02/22/1956 CSN: [id number] Age: 67 Admit Type: Outpatient Procedure:                Colonoscopy Indications:              High risk colon cancer surveillance: Personal                            history of colonic polyps, Last colonoscopy: 2019 Providers:                Arta Silence, MD, Jaci Carrel, RN, Brien Mates, Technician Referring MD:              Medicines:                Monitored Anesthesia Care Complications:            No immediate complications. Estimated Blood Loss:     Estimated blood loss: none. Procedure:                Pre-Anesthesia Assessment:                           - Prior to the procedure, a History and Physical                            was performed, and patient medications and                            allergies were reviewed. The patient's tolerance of                            previous anesthesia was also reviewed. The risks                            and benefits of the procedure and the sedation                            options and risks were discussed with the patient.                            All questions were answered, and informed consent                            was obtained. Prior Anticoagulants: The patient has                            taken no anticoagulant or antiplatelet agents                            except for aspirin. ASA Grade Assessment: III - A  patient with severe systemic disease. After                            reviewing the risks and benefits, the patient was                            deemed in satisfactory condition to undergo the                            procedure.                           After obtaining informed consent, the colonoscope                            was passed under direct  vision. Throughout the                            procedure, the patient's blood pressure, pulse, and                            oxygen saturations were monitored continuously. The                            [id number] [id number]) Olympus colonoscope was                            introduced through the anus and advanced to the the                            cecum, identified by appendiceal orifice and                            ileocecal valve. The ileocecal valve, appendiceal                            orifice, and rectum were photographed. The entire                            colon was examined. The colonoscopy was performed                            without difficulty. The patient tolerated the                            procedure well. The quality of the bowel                            preparation was fair. Scope In: 10:52:38 AM Scope Out: 11:15:50 AM Scope Withdrawal Time: 0 hours 19 minutes 44 seconds  Total Procedure Duration: 0 hours 23 minutes 12 seconds  Findings:      The perianal and digital rectal examinations were normal.      A few medium-mouthed diverticula were found in the sigmoid colon and  descending colon.      Bowel prep diffusely fair; semisolid and viscous stool obscured some       views throughout colon; diminutive or subtle sessile polyps could easily       have been missed.      An 8 mm polyp was found in the cecum. The polyp was sessile. The polyp       was removed with a cold snare. Resection and retrieval were complete.      A 15 mm polyp was found in the ascending colon. The polyp was       pedunculated. The polyp was removed with a hot snare. Resection and       retrieval were complete. To prevent bleeding post-intervention, one       hemostatic clip was successfully placed. There was no bleeding at the       end of the procedure.      Colon otherwise normal; no other polyps, masses, vascular ectasias, or       inflammatory changes were seen.       The retroflexed view of the distal rectum and anal verge was normal and       showed no anal or rectal abnormalities. Impression:               - Preparation of the colon was fair.                           - Diverticulosis in the sigmoid colon and in the                            descending colon.                           - One 8 mm polyp in the cecum, removed with a cold                            snare. Resected and retrieved.                           - One 15 mm polyp in the ascending colon, removed                            with a hot snare. Resected and retrieved. Clip was                            placed.                           - The distal rectum and anal verge are normal on                            retroflexion view.                           - The examination was otherwise normal. Moderate Sedation:      None Recommendation:           - Patient has a contact number available for  emergencies. The signs and symptoms of potential                            delayed complications were discussed with the                            patient. Return to normal activities tomorrow.                            Written discharge instructions were provided to the                            patient.                           - Discharge patient to home (via wheelchair).                           - High fiber diet indefinitely.                           - Continue present medications.                           - No aspirin, ibuprofen, naproxen, or other                            non-steroidal anti-inflammatory drugs for 5 days                            after polyp removal.                           - Await pathology results.                           - Repeat colonoscopy (date not yet determined) for                            surveillance based on pathology results.                           - Return to GI clinic PRN.                           - Return  to referring physician as previously                            scheduled. Procedure Code(s):        --- Professional ---                           [phone], Colonoscopy, flexible; with removal of                            tumor(s), polyp(s), or other lesion(s) by snare  technique Diagnosis Code(s):        --- Professional ---                           Z86.010, Personal history of colonic polyps                           D12.0, Benign neoplasm of cecum                           D12.2, Benign neoplasm of ascending colon                           K57.30, Diverticulosis of large intestine without                            perforation or abscess without bleeding CPT copyright 2022 [employer]. All rights reserved. The codes documented in this report are preliminary and upon coder review may  be revised to meet current compliance requirements. Arta Silence, MD 07/23/2022 11:20:44 AM This report has been signed electronically. Number of Addenda: 0

## 2022-07-24 DIAGNOSIS — D689 Coagulation defect, unspecified: Secondary | ICD-10-CM | POA: Diagnosis not present

## 2022-07-24 DIAGNOSIS — D631 Anemia in chronic kidney disease: Secondary | ICD-10-CM | POA: Diagnosis not present

## 2022-07-24 DIAGNOSIS — Z992 Dependence on renal dialysis: Secondary | ICD-10-CM | POA: Diagnosis not present

## 2022-07-24 DIAGNOSIS — N186 End stage renal disease: Secondary | ICD-10-CM | POA: Diagnosis not present

## 2022-07-24 DIAGNOSIS — N2581 Secondary hyperparathyroidism of renal origin: Secondary | ICD-10-CM | POA: Diagnosis not present

## 2022-07-24 DIAGNOSIS — R52 Pain, unspecified: Secondary | ICD-10-CM | POA: Diagnosis not present

## 2022-07-24 LAB — SURGICAL PATHOLOGY

## 2022-07-25 ENCOUNTER — Encounter (HOSPITAL_COMMUNITY): Payer: Self-pay | Admitting: Gastroenterology

## 2022-07-25 DIAGNOSIS — D631 Anemia in chronic kidney disease: Secondary | ICD-10-CM | POA: Diagnosis not present

## 2022-07-25 DIAGNOSIS — R52 Pain, unspecified: Secondary | ICD-10-CM | POA: Diagnosis not present

## 2022-07-25 DIAGNOSIS — Z992 Dependence on renal dialysis: Secondary | ICD-10-CM | POA: Diagnosis not present

## 2022-07-25 DIAGNOSIS — N2581 Secondary hyperparathyroidism of renal origin: Secondary | ICD-10-CM | POA: Diagnosis not present

## 2022-07-25 DIAGNOSIS — D689 Coagulation defect, unspecified: Secondary | ICD-10-CM | POA: Diagnosis not present

## 2022-07-25 DIAGNOSIS — N186 End stage renal disease: Secondary | ICD-10-CM | POA: Diagnosis not present

## 2022-07-28 DIAGNOSIS — D631 Anemia in chronic kidney disease: Secondary | ICD-10-CM | POA: Diagnosis not present

## 2022-07-28 DIAGNOSIS — N2581 Secondary hyperparathyroidism of renal origin: Secondary | ICD-10-CM | POA: Diagnosis not present

## 2022-07-28 DIAGNOSIS — N186 End stage renal disease: Secondary | ICD-10-CM | POA: Diagnosis not present

## 2022-07-28 DIAGNOSIS — D689 Coagulation defect, unspecified: Secondary | ICD-10-CM | POA: Diagnosis not present

## 2022-07-28 DIAGNOSIS — R52 Pain, unspecified: Secondary | ICD-10-CM | POA: Diagnosis not present

## 2022-07-28 DIAGNOSIS — Z992 Dependence on renal dialysis: Secondary | ICD-10-CM | POA: Diagnosis not present

## 2022-07-29 DIAGNOSIS — M25512 Pain in left shoulder: Secondary | ICD-10-CM | POA: Diagnosis not present

## 2022-07-30 DIAGNOSIS — N2581 Secondary hyperparathyroidism of renal origin: Secondary | ICD-10-CM | POA: Diagnosis not present

## 2022-07-30 DIAGNOSIS — N186 End stage renal disease: Secondary | ICD-10-CM | POA: Diagnosis not present

## 2022-07-30 DIAGNOSIS — D631 Anemia in chronic kidney disease: Secondary | ICD-10-CM | POA: Diagnosis not present

## 2022-07-30 DIAGNOSIS — D689 Coagulation defect, unspecified: Secondary | ICD-10-CM | POA: Diagnosis not present

## 2022-07-30 DIAGNOSIS — Z992 Dependence on renal dialysis: Secondary | ICD-10-CM | POA: Diagnosis not present

## 2022-07-30 DIAGNOSIS — R52 Pain, unspecified: Secondary | ICD-10-CM | POA: Diagnosis not present

## 2022-07-31 DIAGNOSIS — I129 Hypertensive chronic kidney disease with stage 1 through stage 4 chronic kidney disease, or unspecified chronic kidney disease: Secondary | ICD-10-CM | POA: Diagnosis not present

## 2022-07-31 DIAGNOSIS — Z992 Dependence on renal dialysis: Secondary | ICD-10-CM | POA: Diagnosis not present

## 2022-07-31 DIAGNOSIS — N186 End stage renal disease: Secondary | ICD-10-CM | POA: Diagnosis not present

## 2022-08-01 DIAGNOSIS — Z992 Dependence on renal dialysis: Secondary | ICD-10-CM | POA: Diagnosis not present

## 2022-08-01 DIAGNOSIS — N2581 Secondary hyperparathyroidism of renal origin: Secondary | ICD-10-CM | POA: Diagnosis not present

## 2022-08-01 DIAGNOSIS — D631 Anemia in chronic kidney disease: Secondary | ICD-10-CM | POA: Diagnosis not present

## 2022-08-01 DIAGNOSIS — N186 End stage renal disease: Secondary | ICD-10-CM | POA: Diagnosis not present

## 2022-08-01 DIAGNOSIS — R52 Pain, unspecified: Secondary | ICD-10-CM | POA: Diagnosis not present

## 2022-08-01 DIAGNOSIS — D689 Coagulation defect, unspecified: Secondary | ICD-10-CM | POA: Diagnosis not present

## 2022-08-04 DIAGNOSIS — N186 End stage renal disease: Secondary | ICD-10-CM | POA: Diagnosis not present

## 2022-08-04 DIAGNOSIS — R52 Pain, unspecified: Secondary | ICD-10-CM | POA: Diagnosis not present

## 2022-08-04 DIAGNOSIS — D631 Anemia in chronic kidney disease: Secondary | ICD-10-CM | POA: Diagnosis not present

## 2022-08-04 DIAGNOSIS — D689 Coagulation defect, unspecified: Secondary | ICD-10-CM | POA: Diagnosis not present

## 2022-08-04 DIAGNOSIS — N2581 Secondary hyperparathyroidism of renal origin: Secondary | ICD-10-CM | POA: Diagnosis not present

## 2022-08-04 DIAGNOSIS — Z992 Dependence on renal dialysis: Secondary | ICD-10-CM | POA: Diagnosis not present

## 2022-08-05 DIAGNOSIS — E1122 Type 2 diabetes mellitus with diabetic chronic kidney disease: Secondary | ICD-10-CM | POA: Diagnosis not present

## 2022-08-05 DIAGNOSIS — I5032 Chronic diastolic (congestive) heart failure: Secondary | ICD-10-CM | POA: Diagnosis not present

## 2022-08-05 DIAGNOSIS — M170 Bilateral primary osteoarthritis of knee: Secondary | ICD-10-CM | POA: Diagnosis not present

## 2022-08-05 DIAGNOSIS — R296 Repeated falls: Secondary | ICD-10-CM | POA: Diagnosis not present

## 2022-08-05 DIAGNOSIS — N186 End stage renal disease: Secondary | ICD-10-CM | POA: Diagnosis not present

## 2022-08-05 IMAGING — MG DIGITAL DIAGNOSTIC BILAT W/ TOMO W/ CAD
8 of 15 series · 8 of 40 positions shown · non-contrast
Comparison: Previous exam(s).

CLINICAL DATA: 64-year-old presenting with a possible palpable lump
in the OUTER LEFT breast which she felt approximately 2 months ago,
but which she has not felt for the past 2-3 weeks. Annual
evaluation, RIGHT breast.

EXAM:
DIGITAL DIAGNOSTIC BILATERAL MAMMOGRAM WITH TOMO AND CAD

[R CC synth-2D (1 of 2)]
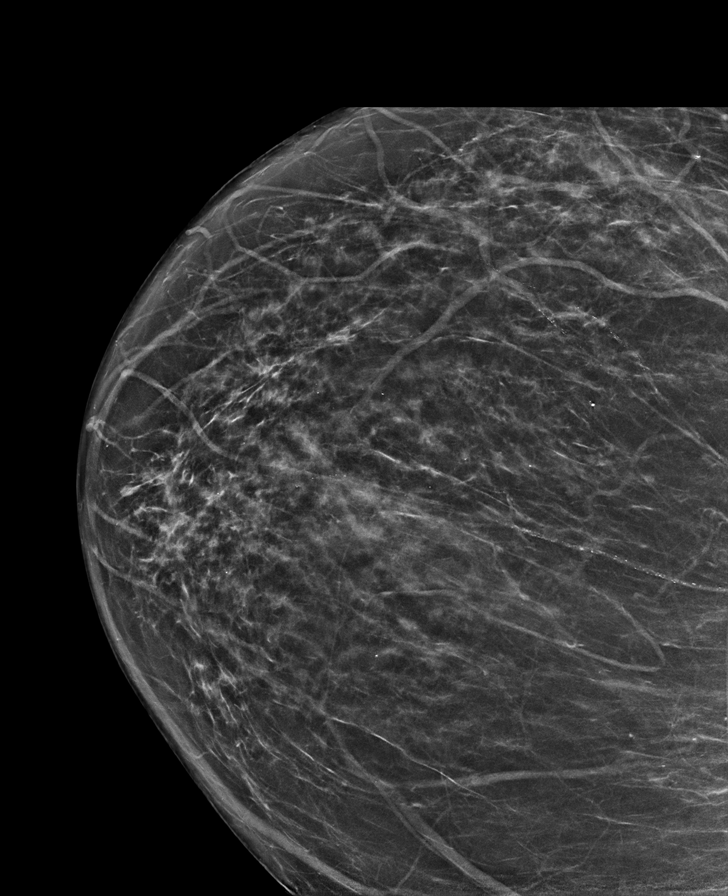

[L MLO synth-2D (1 of 2)]
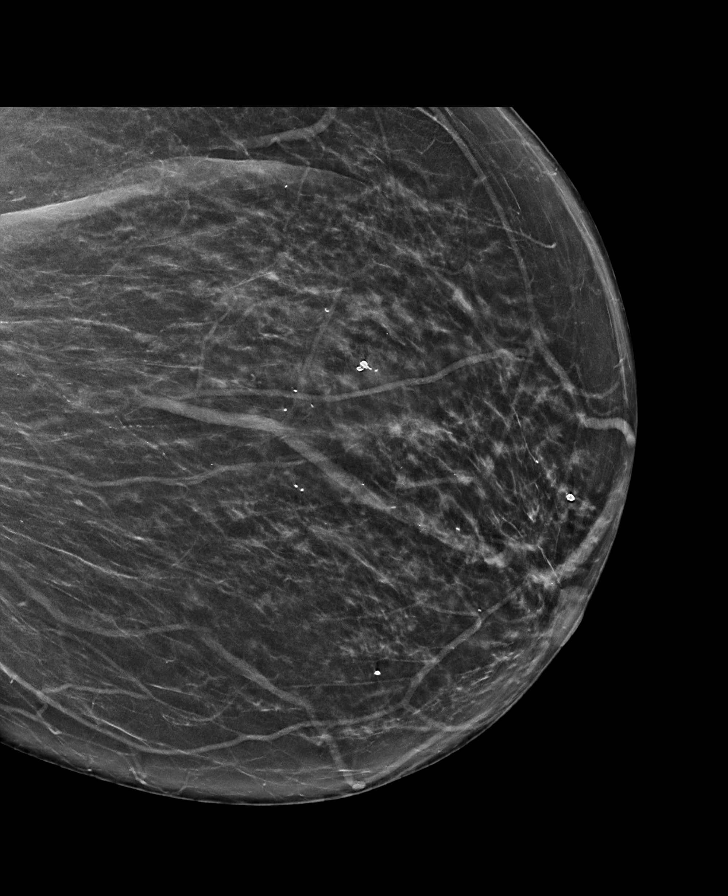

[L MLO synth-2D (2 of 2)]
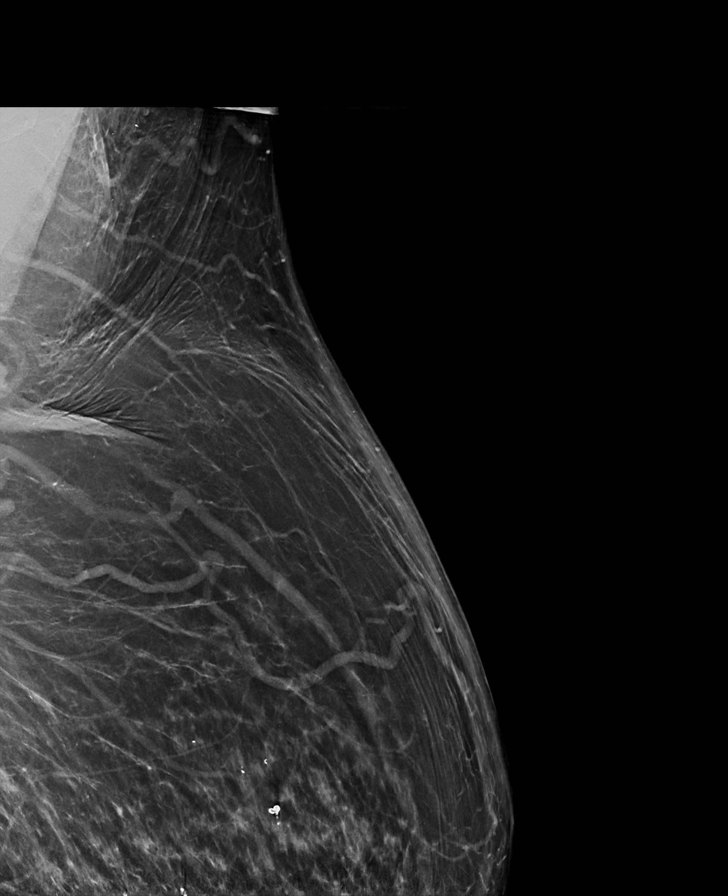

[L CC synth-2D]
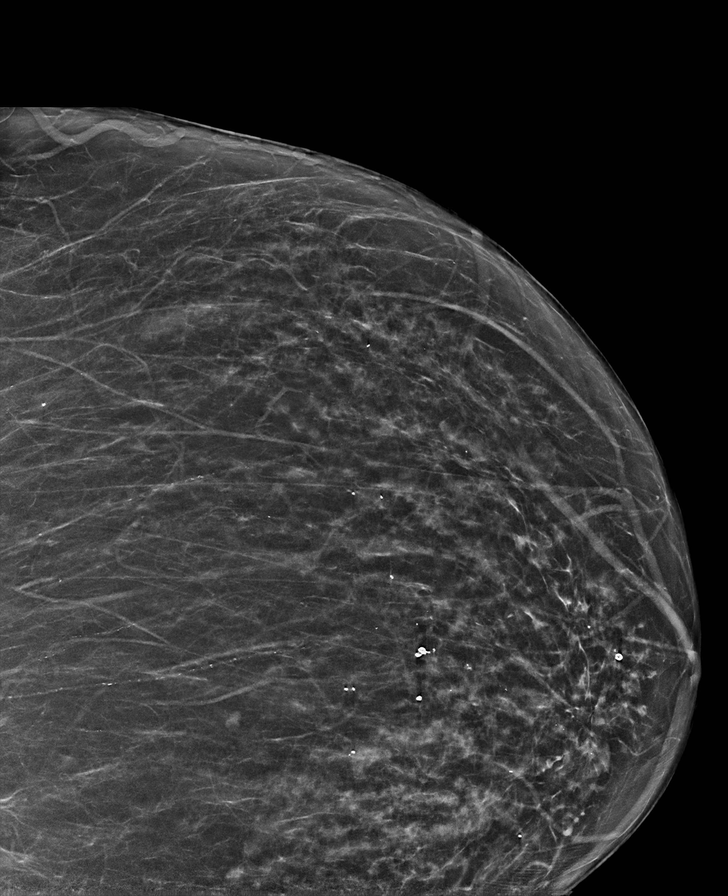

[R CC synth-2D (2 of 2)]
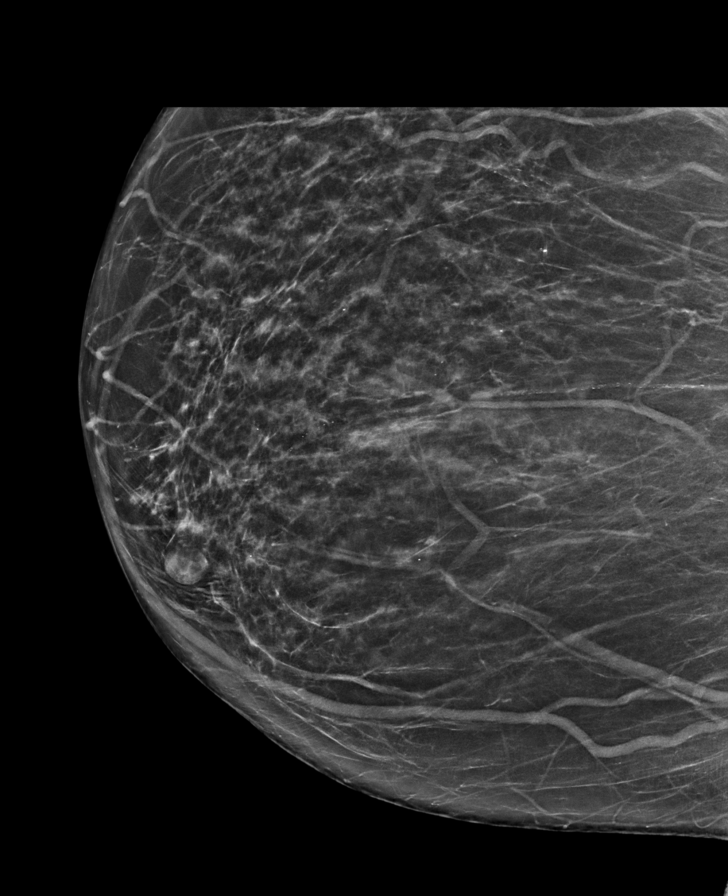

[R MLO synth-2D (1 of 2)]
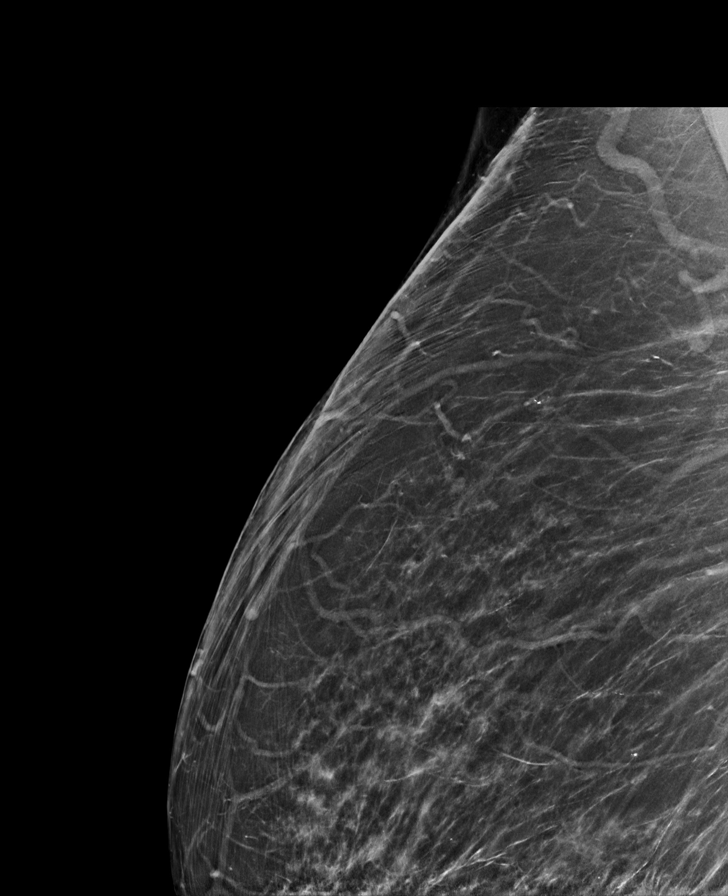

[R MLO synth-2D (2 of 2)]
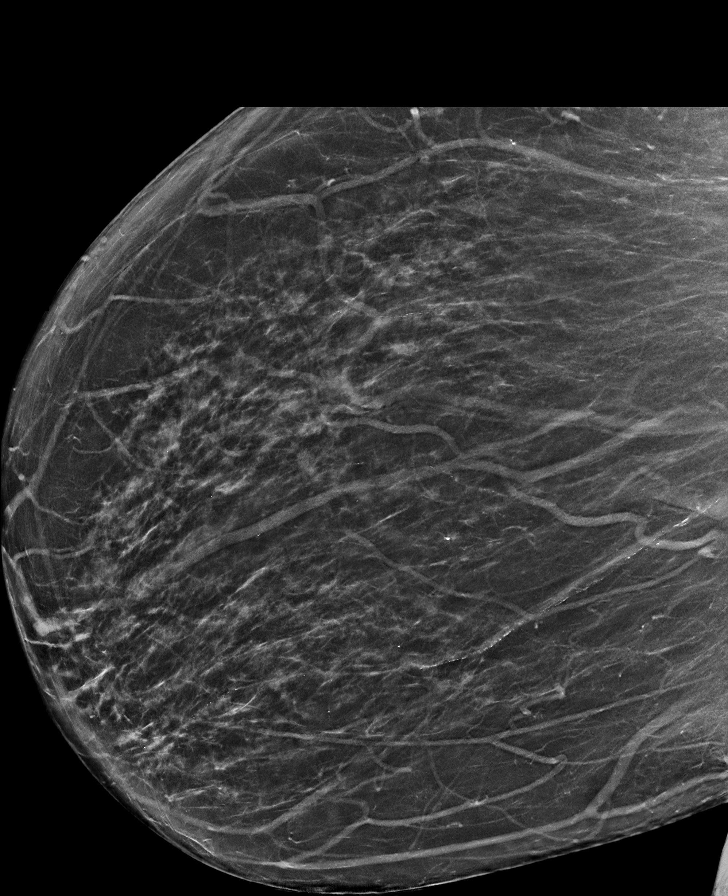

[L MLO tomo · tomo slice 45/66.0]
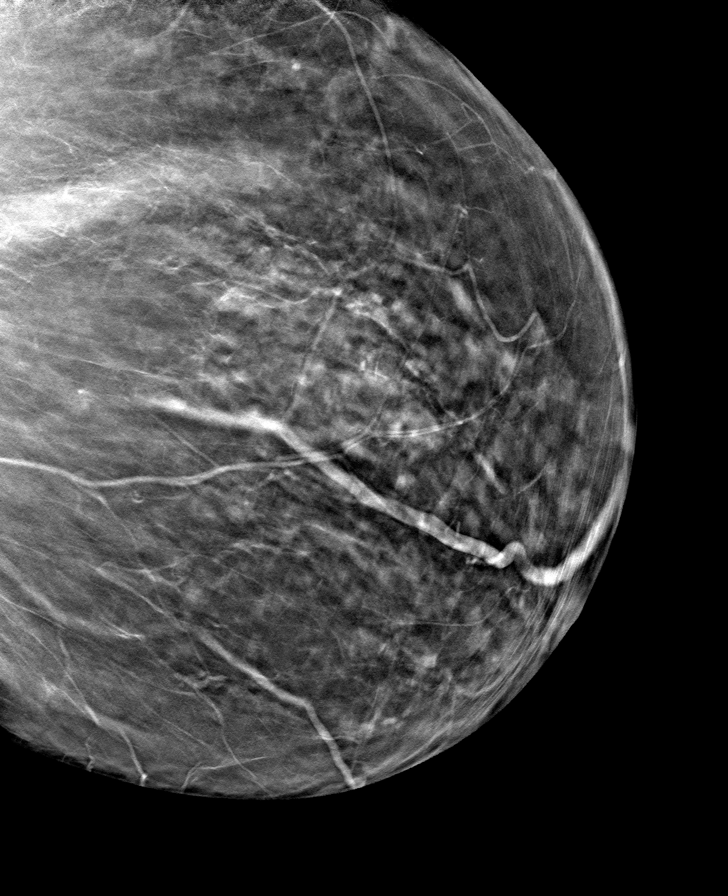

[8 of 40 positions shown; findings below may reference images not displayed]

ACR Breast Density Category b: There are scattered areas of
fibroglandular density.
FINDINGS: Tomosynthesis and synthesized full field CC and MLO views of both
breasts were obtained. Mammographic images were processed with CAD.

No findings suspicious for malignancy in either breast.
IMPRESSION: No mammographic evidence of malignancy involving either breast.

RECOMMENDATION:
Screening mammogram in one year.(Code:7H-S-LCM)

I have discussed the findings and recommendations with the patient.
If applicable, a reminder letter will be sent to the patient
regarding the next appointment.

BI-RADS CATEGORY  1: Negative.

## 2022-08-06 DIAGNOSIS — R52 Pain, unspecified: Secondary | ICD-10-CM | POA: Diagnosis not present

## 2022-08-06 DIAGNOSIS — N186 End stage renal disease: Secondary | ICD-10-CM | POA: Diagnosis not present

## 2022-08-06 DIAGNOSIS — Z992 Dependence on renal dialysis: Secondary | ICD-10-CM | POA: Diagnosis not present

## 2022-08-06 DIAGNOSIS — D631 Anemia in chronic kidney disease: Secondary | ICD-10-CM | POA: Diagnosis not present

## 2022-08-06 DIAGNOSIS — D689 Coagulation defect, unspecified: Secondary | ICD-10-CM | POA: Diagnosis not present

## 2022-08-06 DIAGNOSIS — N2581 Secondary hyperparathyroidism of renal origin: Secondary | ICD-10-CM | POA: Diagnosis not present

## 2022-08-08 DIAGNOSIS — Z992 Dependence on renal dialysis: Secondary | ICD-10-CM | POA: Diagnosis not present

## 2022-08-08 DIAGNOSIS — D689 Coagulation defect, unspecified: Secondary | ICD-10-CM | POA: Diagnosis not present

## 2022-08-08 DIAGNOSIS — R52 Pain, unspecified: Secondary | ICD-10-CM | POA: Diagnosis not present

## 2022-08-08 DIAGNOSIS — D631 Anemia in chronic kidney disease: Secondary | ICD-10-CM | POA: Diagnosis not present

## 2022-08-08 DIAGNOSIS — N186 End stage renal disease: Secondary | ICD-10-CM | POA: Diagnosis not present

## 2022-08-08 DIAGNOSIS — N2581 Secondary hyperparathyroidism of renal origin: Secondary | ICD-10-CM | POA: Diagnosis not present

## 2022-08-10 DIAGNOSIS — Z992 Dependence on renal dialysis: Secondary | ICD-10-CM | POA: Diagnosis not present

## 2022-08-10 DIAGNOSIS — D631 Anemia in chronic kidney disease: Secondary | ICD-10-CM | POA: Diagnosis not present

## 2022-08-10 DIAGNOSIS — N2581 Secondary hyperparathyroidism of renal origin: Secondary | ICD-10-CM | POA: Diagnosis not present

## 2022-08-10 DIAGNOSIS — R52 Pain, unspecified: Secondary | ICD-10-CM | POA: Diagnosis not present

## 2022-08-10 DIAGNOSIS — N186 End stage renal disease: Secondary | ICD-10-CM | POA: Diagnosis not present

## 2022-08-10 DIAGNOSIS — D689 Coagulation defect, unspecified: Secondary | ICD-10-CM | POA: Diagnosis not present

## 2022-08-11 DIAGNOSIS — R52 Pain, unspecified: Secondary | ICD-10-CM | POA: Diagnosis not present

## 2022-08-11 DIAGNOSIS — N2581 Secondary hyperparathyroidism of renal origin: Secondary | ICD-10-CM | POA: Diagnosis not present

## 2022-08-11 DIAGNOSIS — Z992 Dependence on renal dialysis: Secondary | ICD-10-CM | POA: Diagnosis not present

## 2022-08-11 DIAGNOSIS — D631 Anemia in chronic kidney disease: Secondary | ICD-10-CM | POA: Diagnosis not present

## 2022-08-11 DIAGNOSIS — N186 End stage renal disease: Secondary | ICD-10-CM | POA: Diagnosis not present

## 2022-08-11 DIAGNOSIS — D689 Coagulation defect, unspecified: Secondary | ICD-10-CM | POA: Diagnosis not present

## 2022-08-13 DIAGNOSIS — Z992 Dependence on renal dialysis: Secondary | ICD-10-CM | POA: Diagnosis not present

## 2022-08-13 DIAGNOSIS — N2581 Secondary hyperparathyroidism of renal origin: Secondary | ICD-10-CM | POA: Diagnosis not present

## 2022-08-13 DIAGNOSIS — D689 Coagulation defect, unspecified: Secondary | ICD-10-CM | POA: Diagnosis not present

## 2022-08-13 DIAGNOSIS — N186 End stage renal disease: Secondary | ICD-10-CM | POA: Diagnosis not present

## 2022-08-13 DIAGNOSIS — D631 Anemia in chronic kidney disease: Secondary | ICD-10-CM | POA: Diagnosis not present

## 2022-08-13 DIAGNOSIS — R52 Pain, unspecified: Secondary | ICD-10-CM | POA: Diagnosis not present

## 2022-08-15 DIAGNOSIS — D689 Coagulation defect, unspecified: Secondary | ICD-10-CM | POA: Diagnosis not present

## 2022-08-15 DIAGNOSIS — N186 End stage renal disease: Secondary | ICD-10-CM | POA: Diagnosis not present

## 2022-08-15 DIAGNOSIS — N2581 Secondary hyperparathyroidism of renal origin: Secondary | ICD-10-CM | POA: Diagnosis not present

## 2022-08-15 DIAGNOSIS — Z992 Dependence on renal dialysis: Secondary | ICD-10-CM | POA: Diagnosis not present

## 2022-08-15 DIAGNOSIS — D631 Anemia in chronic kidney disease: Secondary | ICD-10-CM | POA: Diagnosis not present

## 2022-08-15 DIAGNOSIS — R52 Pain, unspecified: Secondary | ICD-10-CM | POA: Diagnosis not present

## 2022-08-18 DIAGNOSIS — N186 End stage renal disease: Secondary | ICD-10-CM | POA: Diagnosis not present

## 2022-08-18 DIAGNOSIS — R52 Pain, unspecified: Secondary | ICD-10-CM | POA: Diagnosis not present

## 2022-08-18 DIAGNOSIS — N2581 Secondary hyperparathyroidism of renal origin: Secondary | ICD-10-CM | POA: Diagnosis not present

## 2022-08-18 DIAGNOSIS — D689 Coagulation defect, unspecified: Secondary | ICD-10-CM | POA: Diagnosis not present

## 2022-08-18 DIAGNOSIS — D631 Anemia in chronic kidney disease: Secondary | ICD-10-CM | POA: Diagnosis not present

## 2022-08-18 DIAGNOSIS — Z992 Dependence on renal dialysis: Secondary | ICD-10-CM | POA: Diagnosis not present

## 2022-08-19 DIAGNOSIS — E1122 Type 2 diabetes mellitus with diabetic chronic kidney disease: Secondary | ICD-10-CM | POA: Diagnosis not present

## 2022-08-20 DIAGNOSIS — N2581 Secondary hyperparathyroidism of renal origin: Secondary | ICD-10-CM | POA: Diagnosis not present

## 2022-08-20 DIAGNOSIS — D631 Anemia in chronic kidney disease: Secondary | ICD-10-CM | POA: Diagnosis not present

## 2022-08-20 DIAGNOSIS — R52 Pain, unspecified: Secondary | ICD-10-CM | POA: Diagnosis not present

## 2022-08-20 DIAGNOSIS — N186 End stage renal disease: Secondary | ICD-10-CM | POA: Diagnosis not present

## 2022-08-20 DIAGNOSIS — D689 Coagulation defect, unspecified: Secondary | ICD-10-CM | POA: Diagnosis not present

## 2022-08-20 DIAGNOSIS — Z992 Dependence on renal dialysis: Secondary | ICD-10-CM | POA: Diagnosis not present

## 2022-08-22 DIAGNOSIS — Z992 Dependence on renal dialysis: Secondary | ICD-10-CM | POA: Diagnosis not present

## 2022-08-22 DIAGNOSIS — N186 End stage renal disease: Secondary | ICD-10-CM | POA: Diagnosis not present

## 2022-08-22 DIAGNOSIS — D631 Anemia in chronic kidney disease: Secondary | ICD-10-CM | POA: Diagnosis not present

## 2022-08-22 DIAGNOSIS — D689 Coagulation defect, unspecified: Secondary | ICD-10-CM | POA: Diagnosis not present

## 2022-08-22 DIAGNOSIS — R52 Pain, unspecified: Secondary | ICD-10-CM | POA: Diagnosis not present

## 2022-08-22 DIAGNOSIS — N2581 Secondary hyperparathyroidism of renal origin: Secondary | ICD-10-CM | POA: Diagnosis not present

## 2022-08-25 DIAGNOSIS — N186 End stage renal disease: Secondary | ICD-10-CM | POA: Diagnosis not present

## 2022-08-25 DIAGNOSIS — N2581 Secondary hyperparathyroidism of renal origin: Secondary | ICD-10-CM | POA: Diagnosis not present

## 2022-08-25 DIAGNOSIS — D689 Coagulation defect, unspecified: Secondary | ICD-10-CM | POA: Diagnosis not present

## 2022-08-25 DIAGNOSIS — R52 Pain, unspecified: Secondary | ICD-10-CM | POA: Diagnosis not present

## 2022-08-25 DIAGNOSIS — Z992 Dependence on renal dialysis: Secondary | ICD-10-CM | POA: Diagnosis not present

## 2022-08-25 DIAGNOSIS — D631 Anemia in chronic kidney disease: Secondary | ICD-10-CM | POA: Diagnosis not present

## 2022-08-27 DIAGNOSIS — D631 Anemia in chronic kidney disease: Secondary | ICD-10-CM | POA: Diagnosis not present

## 2022-08-27 DIAGNOSIS — N186 End stage renal disease: Secondary | ICD-10-CM | POA: Diagnosis not present

## 2022-08-27 DIAGNOSIS — R52 Pain, unspecified: Secondary | ICD-10-CM | POA: Diagnosis not present

## 2022-08-27 DIAGNOSIS — D689 Coagulation defect, unspecified: Secondary | ICD-10-CM | POA: Diagnosis not present

## 2022-08-27 DIAGNOSIS — N2581 Secondary hyperparathyroidism of renal origin: Secondary | ICD-10-CM | POA: Diagnosis not present

## 2022-08-27 DIAGNOSIS — Z992 Dependence on renal dialysis: Secondary | ICD-10-CM | POA: Diagnosis not present

## 2022-08-29 DIAGNOSIS — R52 Pain, unspecified: Secondary | ICD-10-CM | POA: Diagnosis not present

## 2022-08-29 DIAGNOSIS — D631 Anemia in chronic kidney disease: Secondary | ICD-10-CM | POA: Diagnosis not present

## 2022-08-29 DIAGNOSIS — D689 Coagulation defect, unspecified: Secondary | ICD-10-CM | POA: Diagnosis not present

## 2022-08-29 DIAGNOSIS — Z992 Dependence on renal dialysis: Secondary | ICD-10-CM | POA: Diagnosis not present

## 2022-08-29 DIAGNOSIS — N2581 Secondary hyperparathyroidism of renal origin: Secondary | ICD-10-CM | POA: Diagnosis not present

## 2022-08-29 DIAGNOSIS — N186 End stage renal disease: Secondary | ICD-10-CM | POA: Diagnosis not present

## 2022-08-31 DIAGNOSIS — N186 End stage renal disease: Secondary | ICD-10-CM | POA: Diagnosis not present

## 2022-08-31 DIAGNOSIS — I129 Hypertensive chronic kidney disease with stage 1 through stage 4 chronic kidney disease, or unspecified chronic kidney disease: Secondary | ICD-10-CM | POA: Diagnosis not present

## 2022-08-31 DIAGNOSIS — Z992 Dependence on renal dialysis: Secondary | ICD-10-CM | POA: Diagnosis not present

## 2022-09-01 DIAGNOSIS — D509 Iron deficiency anemia, unspecified: Secondary | ICD-10-CM | POA: Diagnosis not present

## 2022-09-01 DIAGNOSIS — R52 Pain, unspecified: Secondary | ICD-10-CM | POA: Diagnosis not present

## 2022-09-01 DIAGNOSIS — D631 Anemia in chronic kidney disease: Secondary | ICD-10-CM | POA: Diagnosis not present

## 2022-09-01 DIAGNOSIS — D689 Coagulation defect, unspecified: Secondary | ICD-10-CM | POA: Diagnosis not present

## 2022-09-01 DIAGNOSIS — Z992 Dependence on renal dialysis: Secondary | ICD-10-CM | POA: Diagnosis not present

## 2022-09-01 DIAGNOSIS — N2581 Secondary hyperparathyroidism of renal origin: Secondary | ICD-10-CM | POA: Diagnosis not present

## 2022-09-01 DIAGNOSIS — N186 End stage renal disease: Secondary | ICD-10-CM | POA: Diagnosis not present

## 2022-09-01 DIAGNOSIS — E1122 Type 2 diabetes mellitus with diabetic chronic kidney disease: Secondary | ICD-10-CM | POA: Diagnosis not present

## 2022-09-03 DIAGNOSIS — D689 Coagulation defect, unspecified: Secondary | ICD-10-CM | POA: Diagnosis not present

## 2022-09-03 DIAGNOSIS — N2581 Secondary hyperparathyroidism of renal origin: Secondary | ICD-10-CM | POA: Diagnosis not present

## 2022-09-03 DIAGNOSIS — E1122 Type 2 diabetes mellitus with diabetic chronic kidney disease: Secondary | ICD-10-CM | POA: Diagnosis not present

## 2022-09-03 DIAGNOSIS — N186 End stage renal disease: Secondary | ICD-10-CM | POA: Diagnosis not present

## 2022-09-03 DIAGNOSIS — R52 Pain, unspecified: Secondary | ICD-10-CM | POA: Diagnosis not present

## 2022-09-03 DIAGNOSIS — D631 Anemia in chronic kidney disease: Secondary | ICD-10-CM | POA: Diagnosis not present

## 2022-09-03 DIAGNOSIS — D509 Iron deficiency anemia, unspecified: Secondary | ICD-10-CM | POA: Diagnosis not present

## 2022-09-03 DIAGNOSIS — Z992 Dependence on renal dialysis: Secondary | ICD-10-CM | POA: Diagnosis not present

## 2022-09-05 DIAGNOSIS — Z992 Dependence on renal dialysis: Secondary | ICD-10-CM | POA: Diagnosis not present

## 2022-09-05 DIAGNOSIS — R52 Pain, unspecified: Secondary | ICD-10-CM | POA: Diagnosis not present

## 2022-09-05 DIAGNOSIS — N2581 Secondary hyperparathyroidism of renal origin: Secondary | ICD-10-CM | POA: Diagnosis not present

## 2022-09-05 DIAGNOSIS — D631 Anemia in chronic kidney disease: Secondary | ICD-10-CM | POA: Diagnosis not present

## 2022-09-05 DIAGNOSIS — E1122 Type 2 diabetes mellitus with diabetic chronic kidney disease: Secondary | ICD-10-CM | POA: Diagnosis not present

## 2022-09-05 DIAGNOSIS — D509 Iron deficiency anemia, unspecified: Secondary | ICD-10-CM | POA: Diagnosis not present

## 2022-09-05 DIAGNOSIS — D689 Coagulation defect, unspecified: Secondary | ICD-10-CM | POA: Diagnosis not present

## 2022-09-05 DIAGNOSIS — N186 End stage renal disease: Secondary | ICD-10-CM | POA: Diagnosis not present

## 2022-09-08 DIAGNOSIS — N186 End stage renal disease: Secondary | ICD-10-CM | POA: Diagnosis not present

## 2022-09-08 DIAGNOSIS — D689 Coagulation defect, unspecified: Secondary | ICD-10-CM | POA: Diagnosis not present

## 2022-09-08 DIAGNOSIS — E1122 Type 2 diabetes mellitus with diabetic chronic kidney disease: Secondary | ICD-10-CM | POA: Diagnosis not present

## 2022-09-08 DIAGNOSIS — Z992 Dependence on renal dialysis: Secondary | ICD-10-CM | POA: Diagnosis not present

## 2022-09-08 DIAGNOSIS — N2581 Secondary hyperparathyroidism of renal origin: Secondary | ICD-10-CM | POA: Diagnosis not present

## 2022-09-08 DIAGNOSIS — D509 Iron deficiency anemia, unspecified: Secondary | ICD-10-CM | POA: Diagnosis not present

## 2022-09-08 DIAGNOSIS — R52 Pain, unspecified: Secondary | ICD-10-CM | POA: Diagnosis not present

## 2022-09-08 DIAGNOSIS — D631 Anemia in chronic kidney disease: Secondary | ICD-10-CM | POA: Diagnosis not present

## 2022-09-09 DIAGNOSIS — N186 End stage renal disease: Secondary | ICD-10-CM | POA: Diagnosis not present

## 2022-09-09 DIAGNOSIS — E1122 Type 2 diabetes mellitus with diabetic chronic kidney disease: Secondary | ICD-10-CM | POA: Diagnosis not present

## 2022-09-09 DIAGNOSIS — E7800 Pure hypercholesterolemia, unspecified: Secondary | ICD-10-CM | POA: Diagnosis not present

## 2022-09-09 DIAGNOSIS — I10 Essential (primary) hypertension: Secondary | ICD-10-CM | POA: Diagnosis not present

## 2022-09-09 DIAGNOSIS — Z794 Long term (current) use of insulin: Secondary | ICD-10-CM | POA: Diagnosis not present

## 2022-09-10 DIAGNOSIS — R52 Pain, unspecified: Secondary | ICD-10-CM | POA: Diagnosis not present

## 2022-09-10 DIAGNOSIS — D631 Anemia in chronic kidney disease: Secondary | ICD-10-CM | POA: Diagnosis not present

## 2022-09-10 DIAGNOSIS — N2581 Secondary hyperparathyroidism of renal origin: Secondary | ICD-10-CM | POA: Diagnosis not present

## 2022-09-10 DIAGNOSIS — D689 Coagulation defect, unspecified: Secondary | ICD-10-CM | POA: Diagnosis not present

## 2022-09-10 DIAGNOSIS — D509 Iron deficiency anemia, unspecified: Secondary | ICD-10-CM | POA: Diagnosis not present

## 2022-09-10 DIAGNOSIS — N186 End stage renal disease: Secondary | ICD-10-CM | POA: Diagnosis not present

## 2022-09-10 DIAGNOSIS — E1122 Type 2 diabetes mellitus with diabetic chronic kidney disease: Secondary | ICD-10-CM | POA: Diagnosis not present

## 2022-09-10 DIAGNOSIS — Z992 Dependence on renal dialysis: Secondary | ICD-10-CM | POA: Diagnosis not present

## 2022-09-12 DIAGNOSIS — N186 End stage renal disease: Secondary | ICD-10-CM | POA: Diagnosis not present

## 2022-09-12 DIAGNOSIS — D689 Coagulation defect, unspecified: Secondary | ICD-10-CM | POA: Diagnosis not present

## 2022-09-12 DIAGNOSIS — N2581 Secondary hyperparathyroidism of renal origin: Secondary | ICD-10-CM | POA: Diagnosis not present

## 2022-09-12 DIAGNOSIS — D509 Iron deficiency anemia, unspecified: Secondary | ICD-10-CM | POA: Diagnosis not present

## 2022-09-12 DIAGNOSIS — D631 Anemia in chronic kidney disease: Secondary | ICD-10-CM | POA: Diagnosis not present

## 2022-09-12 DIAGNOSIS — Z992 Dependence on renal dialysis: Secondary | ICD-10-CM | POA: Diagnosis not present

## 2022-09-12 DIAGNOSIS — E1122 Type 2 diabetes mellitus with diabetic chronic kidney disease: Secondary | ICD-10-CM | POA: Diagnosis not present

## 2022-09-12 DIAGNOSIS — R52 Pain, unspecified: Secondary | ICD-10-CM | POA: Diagnosis not present

## 2022-09-15 DIAGNOSIS — D631 Anemia in chronic kidney disease: Secondary | ICD-10-CM | POA: Diagnosis not present

## 2022-09-15 DIAGNOSIS — D689 Coagulation defect, unspecified: Secondary | ICD-10-CM | POA: Diagnosis not present

## 2022-09-15 DIAGNOSIS — R52 Pain, unspecified: Secondary | ICD-10-CM | POA: Diagnosis not present

## 2022-09-15 DIAGNOSIS — D509 Iron deficiency anemia, unspecified: Secondary | ICD-10-CM | POA: Diagnosis not present

## 2022-09-15 DIAGNOSIS — N186 End stage renal disease: Secondary | ICD-10-CM | POA: Diagnosis not present

## 2022-09-15 DIAGNOSIS — N2581 Secondary hyperparathyroidism of renal origin: Secondary | ICD-10-CM | POA: Diagnosis not present

## 2022-09-15 DIAGNOSIS — Z992 Dependence on renal dialysis: Secondary | ICD-10-CM | POA: Diagnosis not present

## 2022-09-15 DIAGNOSIS — E1122 Type 2 diabetes mellitus with diabetic chronic kidney disease: Secondary | ICD-10-CM | POA: Diagnosis not present

## 2022-09-17 DIAGNOSIS — R52 Pain, unspecified: Secondary | ICD-10-CM | POA: Diagnosis not present

## 2022-09-17 DIAGNOSIS — D509 Iron deficiency anemia, unspecified: Secondary | ICD-10-CM | POA: Diagnosis not present

## 2022-09-17 DIAGNOSIS — Z992 Dependence on renal dialysis: Secondary | ICD-10-CM | POA: Diagnosis not present

## 2022-09-17 DIAGNOSIS — N186 End stage renal disease: Secondary | ICD-10-CM | POA: Diagnosis not present

## 2022-09-17 DIAGNOSIS — E1122 Type 2 diabetes mellitus with diabetic chronic kidney disease: Secondary | ICD-10-CM | POA: Diagnosis not present

## 2022-09-17 DIAGNOSIS — N2581 Secondary hyperparathyroidism of renal origin: Secondary | ICD-10-CM | POA: Diagnosis not present

## 2022-09-17 DIAGNOSIS — D689 Coagulation defect, unspecified: Secondary | ICD-10-CM | POA: Diagnosis not present

## 2022-09-17 DIAGNOSIS — D631 Anemia in chronic kidney disease: Secondary | ICD-10-CM | POA: Diagnosis not present

## 2022-09-19 DIAGNOSIS — N186 End stage renal disease: Secondary | ICD-10-CM | POA: Diagnosis not present

## 2022-09-19 DIAGNOSIS — Z992 Dependence on renal dialysis: Secondary | ICD-10-CM | POA: Diagnosis not present

## 2022-09-19 DIAGNOSIS — D631 Anemia in chronic kidney disease: Secondary | ICD-10-CM | POA: Diagnosis not present

## 2022-09-19 DIAGNOSIS — N2581 Secondary hyperparathyroidism of renal origin: Secondary | ICD-10-CM | POA: Diagnosis not present

## 2022-09-19 DIAGNOSIS — D689 Coagulation defect, unspecified: Secondary | ICD-10-CM | POA: Diagnosis not present

## 2022-09-19 DIAGNOSIS — D509 Iron deficiency anemia, unspecified: Secondary | ICD-10-CM | POA: Diagnosis not present

## 2022-09-19 DIAGNOSIS — E1122 Type 2 diabetes mellitus with diabetic chronic kidney disease: Secondary | ICD-10-CM | POA: Diagnosis not present

## 2022-09-19 DIAGNOSIS — R52 Pain, unspecified: Secondary | ICD-10-CM | POA: Diagnosis not present

## 2022-09-22 DIAGNOSIS — E1122 Type 2 diabetes mellitus with diabetic chronic kidney disease: Secondary | ICD-10-CM | POA: Diagnosis not present

## 2022-09-22 DIAGNOSIS — N2581 Secondary hyperparathyroidism of renal origin: Secondary | ICD-10-CM | POA: Diagnosis not present

## 2022-09-22 DIAGNOSIS — D689 Coagulation defect, unspecified: Secondary | ICD-10-CM | POA: Diagnosis not present

## 2022-09-22 DIAGNOSIS — N186 End stage renal disease: Secondary | ICD-10-CM | POA: Diagnosis not present

## 2022-09-22 DIAGNOSIS — Z992 Dependence on renal dialysis: Secondary | ICD-10-CM | POA: Diagnosis not present

## 2022-09-22 DIAGNOSIS — D631 Anemia in chronic kidney disease: Secondary | ICD-10-CM | POA: Diagnosis not present

## 2022-09-22 DIAGNOSIS — R52 Pain, unspecified: Secondary | ICD-10-CM | POA: Diagnosis not present

## 2022-09-22 DIAGNOSIS — D509 Iron deficiency anemia, unspecified: Secondary | ICD-10-CM | POA: Diagnosis not present

## 2022-09-24 DIAGNOSIS — D509 Iron deficiency anemia, unspecified: Secondary | ICD-10-CM | POA: Diagnosis not present

## 2022-09-24 DIAGNOSIS — Z992 Dependence on renal dialysis: Secondary | ICD-10-CM | POA: Diagnosis not present

## 2022-09-24 DIAGNOSIS — N186 End stage renal disease: Secondary | ICD-10-CM | POA: Diagnosis not present

## 2022-09-24 DIAGNOSIS — D689 Coagulation defect, unspecified: Secondary | ICD-10-CM | POA: Diagnosis not present

## 2022-09-24 DIAGNOSIS — N2581 Secondary hyperparathyroidism of renal origin: Secondary | ICD-10-CM | POA: Diagnosis not present

## 2022-09-24 DIAGNOSIS — R52 Pain, unspecified: Secondary | ICD-10-CM | POA: Diagnosis not present

## 2022-09-24 DIAGNOSIS — D631 Anemia in chronic kidney disease: Secondary | ICD-10-CM | POA: Diagnosis not present

## 2022-09-24 DIAGNOSIS — E1122 Type 2 diabetes mellitus with diabetic chronic kidney disease: Secondary | ICD-10-CM | POA: Diagnosis not present

## 2022-09-26 DIAGNOSIS — E1122 Type 2 diabetes mellitus with diabetic chronic kidney disease: Secondary | ICD-10-CM | POA: Diagnosis not present

## 2022-09-26 DIAGNOSIS — D509 Iron deficiency anemia, unspecified: Secondary | ICD-10-CM | POA: Diagnosis not present

## 2022-09-26 DIAGNOSIS — R52 Pain, unspecified: Secondary | ICD-10-CM | POA: Diagnosis not present

## 2022-09-26 DIAGNOSIS — N2581 Secondary hyperparathyroidism of renal origin: Secondary | ICD-10-CM | POA: Diagnosis not present

## 2022-09-26 DIAGNOSIS — D631 Anemia in chronic kidney disease: Secondary | ICD-10-CM | POA: Diagnosis not present

## 2022-09-26 DIAGNOSIS — N186 End stage renal disease: Secondary | ICD-10-CM | POA: Diagnosis not present

## 2022-09-26 DIAGNOSIS — D689 Coagulation defect, unspecified: Secondary | ICD-10-CM | POA: Diagnosis not present

## 2022-09-26 DIAGNOSIS — Z992 Dependence on renal dialysis: Secondary | ICD-10-CM | POA: Diagnosis not present

## 2022-09-29 DIAGNOSIS — E1122 Type 2 diabetes mellitus with diabetic chronic kidney disease: Secondary | ICD-10-CM | POA: Diagnosis not present

## 2022-09-29 DIAGNOSIS — N2581 Secondary hyperparathyroidism of renal origin: Secondary | ICD-10-CM | POA: Diagnosis not present

## 2022-09-29 DIAGNOSIS — D631 Anemia in chronic kidney disease: Secondary | ICD-10-CM | POA: Diagnosis not present

## 2022-09-29 DIAGNOSIS — N186 End stage renal disease: Secondary | ICD-10-CM | POA: Diagnosis not present

## 2022-09-29 DIAGNOSIS — R52 Pain, unspecified: Secondary | ICD-10-CM | POA: Diagnosis not present

## 2022-09-29 DIAGNOSIS — D689 Coagulation defect, unspecified: Secondary | ICD-10-CM | POA: Diagnosis not present

## 2022-09-29 DIAGNOSIS — D509 Iron deficiency anemia, unspecified: Secondary | ICD-10-CM | POA: Diagnosis not present

## 2022-09-29 DIAGNOSIS — Z992 Dependence on renal dialysis: Secondary | ICD-10-CM | POA: Diagnosis not present

## 2022-09-30 ENCOUNTER — Ambulatory Visit: Payer: Medicare Other | Admitting: Podiatry

## 2022-09-30 ENCOUNTER — Encounter: Payer: Self-pay | Admitting: Podiatry

## 2022-09-30 DIAGNOSIS — E119 Type 2 diabetes mellitus without complications: Secondary | ICD-10-CM | POA: Diagnosis not present

## 2022-09-30 DIAGNOSIS — M79675 Pain in left toe(s): Secondary | ICD-10-CM | POA: Diagnosis not present

## 2022-09-30 DIAGNOSIS — B351 Tinea unguium: Secondary | ICD-10-CM

## 2022-09-30 DIAGNOSIS — N186 End stage renal disease: Secondary | ICD-10-CM | POA: Diagnosis not present

## 2022-09-30 DIAGNOSIS — M79674 Pain in right toe(s): Secondary | ICD-10-CM

## 2022-09-30 DIAGNOSIS — I129 Hypertensive chronic kidney disease with stage 1 through stage 4 chronic kidney disease, or unspecified chronic kidney disease: Secondary | ICD-10-CM | POA: Diagnosis not present

## 2022-09-30 DIAGNOSIS — Z992 Dependence on renal dialysis: Secondary | ICD-10-CM | POA: Diagnosis not present

## 2022-09-30 DIAGNOSIS — E1121 Type 2 diabetes mellitus with diabetic nephropathy: Secondary | ICD-10-CM | POA: Diagnosis not present

## 2022-09-30 NOTE — Progress Notes (Signed)
  Subjective:  Patient ID: [name], female    DOB: 1956-02-10,  MRN: [id number]  Chief Complaint  Patient presents with   Diabetes    np diabetic foot care/ nail trim    67 y.o. female presents with the above complaint. History confirmed with patient.  Her nails are severely thickened elongated and causing pain, they are not able to cut them due to the thickness and length.  She has controlled type 2 diabetes, her A1c is 6.3%.  She is on dialysis.  Objective:  Physical Exam: warm, good capillary refill, no trophic changes or ulcerative lesions, normal DP and PT pulses, normal monofilament exam, and normal sensory exam. Left Foot: dystrophic yellowed discolored nail plates with subungual debris Right Foot: dystrophic yellowed discolored nail plates with subungual debris  Assessment:   1. Pain due to onychomycosis of toenails of both feet   2. Type 2 diabetes mellitus with diabetic nephropathy, without long-term current use of insulin (HCC)   3. Encounter for diabetic foot exam [facility])      Plan:  Patient was evaluated and treated and all questions answered.  Patient educated on diabetes. Discussed proper diabetic foot care and discussed risks and complications of disease. Educated patient in depth on reasons to return to the office immediately should he/she discover anything concerning or new on the feet. All questions answered. Discussed proper shoes as well.   Discussed the etiology and treatment options for the condition in detail with the patient. Educated patient on the topical and oral treatment options for mycotic nails. Recommended debridement of the nails today. Sharp and mechanical debridement performed of all painful and mycotic nails today. Nails debrided in length and thickness using a nail nipper to level of comfort. Discussed treatment options including appropriate shoe gear. Follow up as needed for painful nails.    Return in about 3 months (around 12/30/2022) for  at risk diabetic foot care.

## 2022-10-01 DIAGNOSIS — R52 Pain, unspecified: Secondary | ICD-10-CM | POA: Diagnosis not present

## 2022-10-01 DIAGNOSIS — N186 End stage renal disease: Secondary | ICD-10-CM | POA: Diagnosis not present

## 2022-10-01 DIAGNOSIS — D689 Coagulation defect, unspecified: Secondary | ICD-10-CM | POA: Diagnosis not present

## 2022-10-01 DIAGNOSIS — N2581 Secondary hyperparathyroidism of renal origin: Secondary | ICD-10-CM | POA: Diagnosis not present

## 2022-10-01 DIAGNOSIS — Z992 Dependence on renal dialysis: Secondary | ICD-10-CM | POA: Diagnosis not present

## 2022-10-03 DIAGNOSIS — R52 Pain, unspecified: Secondary | ICD-10-CM | POA: Diagnosis not present

## 2022-10-03 DIAGNOSIS — D689 Coagulation defect, unspecified: Secondary | ICD-10-CM | POA: Diagnosis not present

## 2022-10-03 DIAGNOSIS — N186 End stage renal disease: Secondary | ICD-10-CM | POA: Diagnosis not present

## 2022-10-03 DIAGNOSIS — Z992 Dependence on renal dialysis: Secondary | ICD-10-CM | POA: Diagnosis not present

## 2022-10-03 DIAGNOSIS — N2581 Secondary hyperparathyroidism of renal origin: Secondary | ICD-10-CM | POA: Diagnosis not present

## 2022-10-06 DIAGNOSIS — N186 End stage renal disease: Secondary | ICD-10-CM | POA: Diagnosis not present

## 2022-10-06 DIAGNOSIS — R52 Pain, unspecified: Secondary | ICD-10-CM | POA: Diagnosis not present

## 2022-10-06 DIAGNOSIS — Z992 Dependence on renal dialysis: Secondary | ICD-10-CM | POA: Diagnosis not present

## 2022-10-06 DIAGNOSIS — D689 Coagulation defect, unspecified: Secondary | ICD-10-CM | POA: Diagnosis not present

## 2022-10-06 DIAGNOSIS — N2581 Secondary hyperparathyroidism of renal origin: Secondary | ICD-10-CM | POA: Diagnosis not present

## 2022-10-08 DIAGNOSIS — N186 End stage renal disease: Secondary | ICD-10-CM | POA: Diagnosis not present

## 2022-10-08 DIAGNOSIS — D689 Coagulation defect, unspecified: Secondary | ICD-10-CM | POA: Diagnosis not present

## 2022-10-08 DIAGNOSIS — R52 Pain, unspecified: Secondary | ICD-10-CM | POA: Diagnosis not present

## 2022-10-08 DIAGNOSIS — N2581 Secondary hyperparathyroidism of renal origin: Secondary | ICD-10-CM | POA: Diagnosis not present

## 2022-10-08 DIAGNOSIS — Z992 Dependence on renal dialysis: Secondary | ICD-10-CM | POA: Diagnosis not present

## 2022-10-10 DIAGNOSIS — Z992 Dependence on renal dialysis: Secondary | ICD-10-CM | POA: Diagnosis not present

## 2022-10-10 DIAGNOSIS — R52 Pain, unspecified: Secondary | ICD-10-CM | POA: Diagnosis not present

## 2022-10-10 DIAGNOSIS — D689 Coagulation defect, unspecified: Secondary | ICD-10-CM | POA: Diagnosis not present

## 2022-10-10 DIAGNOSIS — N2581 Secondary hyperparathyroidism of renal origin: Secondary | ICD-10-CM | POA: Diagnosis not present

## 2022-10-10 DIAGNOSIS — N186 End stage renal disease: Secondary | ICD-10-CM | POA: Diagnosis not present

## 2022-10-13 DIAGNOSIS — D689 Coagulation defect, unspecified: Secondary | ICD-10-CM | POA: Diagnosis not present

## 2022-10-13 DIAGNOSIS — N2581 Secondary hyperparathyroidism of renal origin: Secondary | ICD-10-CM | POA: Diagnosis not present

## 2022-10-13 DIAGNOSIS — N186 End stage renal disease: Secondary | ICD-10-CM | POA: Diagnosis not present

## 2022-10-13 DIAGNOSIS — Z992 Dependence on renal dialysis: Secondary | ICD-10-CM | POA: Diagnosis not present

## 2022-10-13 DIAGNOSIS — R52 Pain, unspecified: Secondary | ICD-10-CM | POA: Diagnosis not present

## 2022-10-15 DIAGNOSIS — R52 Pain, unspecified: Secondary | ICD-10-CM | POA: Diagnosis not present

## 2022-10-15 DIAGNOSIS — Z992 Dependence on renal dialysis: Secondary | ICD-10-CM | POA: Diagnosis not present

## 2022-10-15 DIAGNOSIS — N186 End stage renal disease: Secondary | ICD-10-CM | POA: Diagnosis not present

## 2022-10-15 DIAGNOSIS — N2581 Secondary hyperparathyroidism of renal origin: Secondary | ICD-10-CM | POA: Diagnosis not present

## 2022-10-15 DIAGNOSIS — D689 Coagulation defect, unspecified: Secondary | ICD-10-CM | POA: Diagnosis not present

## 2022-10-17 DIAGNOSIS — Z992 Dependence on renal dialysis: Secondary | ICD-10-CM | POA: Diagnosis not present

## 2022-10-17 DIAGNOSIS — D689 Coagulation defect, unspecified: Secondary | ICD-10-CM | POA: Diagnosis not present

## 2022-10-17 DIAGNOSIS — R52 Pain, unspecified: Secondary | ICD-10-CM | POA: Diagnosis not present

## 2022-10-17 DIAGNOSIS — N186 End stage renal disease: Secondary | ICD-10-CM | POA: Diagnosis not present

## 2022-10-17 DIAGNOSIS — N2581 Secondary hyperparathyroidism of renal origin: Secondary | ICD-10-CM | POA: Diagnosis not present

## 2022-10-19 DIAGNOSIS — E1122 Type 2 diabetes mellitus with diabetic chronic kidney disease: Secondary | ICD-10-CM | POA: Diagnosis not present

## 2022-10-20 DIAGNOSIS — D689 Coagulation defect, unspecified: Secondary | ICD-10-CM | POA: Diagnosis not present

## 2022-10-20 DIAGNOSIS — N2581 Secondary hyperparathyroidism of renal origin: Secondary | ICD-10-CM | POA: Diagnosis not present

## 2022-10-20 DIAGNOSIS — N186 End stage renal disease: Secondary | ICD-10-CM | POA: Diagnosis not present

## 2022-10-20 DIAGNOSIS — Z992 Dependence on renal dialysis: Secondary | ICD-10-CM | POA: Diagnosis not present

## 2022-10-20 DIAGNOSIS — R52 Pain, unspecified: Secondary | ICD-10-CM | POA: Diagnosis not present

## 2022-10-22 DIAGNOSIS — D689 Coagulation defect, unspecified: Secondary | ICD-10-CM | POA: Diagnosis not present

## 2022-10-22 DIAGNOSIS — N186 End stage renal disease: Secondary | ICD-10-CM | POA: Diagnosis not present

## 2022-10-22 DIAGNOSIS — N2581 Secondary hyperparathyroidism of renal origin: Secondary | ICD-10-CM | POA: Diagnosis not present

## 2022-10-22 DIAGNOSIS — R52 Pain, unspecified: Secondary | ICD-10-CM | POA: Diagnosis not present

## 2022-10-22 DIAGNOSIS — Z992 Dependence on renal dialysis: Secondary | ICD-10-CM | POA: Diagnosis not present

## 2022-10-24 DIAGNOSIS — N2581 Secondary hyperparathyroidism of renal origin: Secondary | ICD-10-CM | POA: Diagnosis not present

## 2022-10-24 DIAGNOSIS — R52 Pain, unspecified: Secondary | ICD-10-CM | POA: Diagnosis not present

## 2022-10-24 DIAGNOSIS — Z992 Dependence on renal dialysis: Secondary | ICD-10-CM | POA: Diagnosis not present

## 2022-10-24 DIAGNOSIS — D689 Coagulation defect, unspecified: Secondary | ICD-10-CM | POA: Diagnosis not present

## 2022-10-24 DIAGNOSIS — N186 End stage renal disease: Secondary | ICD-10-CM | POA: Diagnosis not present

## 2022-10-27 DIAGNOSIS — D689 Coagulation defect, unspecified: Secondary | ICD-10-CM | POA: Diagnosis not present

## 2022-10-27 DIAGNOSIS — Z992 Dependence on renal dialysis: Secondary | ICD-10-CM | POA: Diagnosis not present

## 2022-10-27 DIAGNOSIS — R52 Pain, unspecified: Secondary | ICD-10-CM | POA: Diagnosis not present

## 2022-10-27 DIAGNOSIS — N186 End stage renal disease: Secondary | ICD-10-CM | POA: Diagnosis not present

## 2022-10-27 DIAGNOSIS — N2581 Secondary hyperparathyroidism of renal origin: Secondary | ICD-10-CM | POA: Diagnosis not present

## 2022-10-29 DIAGNOSIS — D689 Coagulation defect, unspecified: Secondary | ICD-10-CM | POA: Diagnosis not present

## 2022-10-29 DIAGNOSIS — R52 Pain, unspecified: Secondary | ICD-10-CM | POA: Diagnosis not present

## 2022-10-29 DIAGNOSIS — N2581 Secondary hyperparathyroidism of renal origin: Secondary | ICD-10-CM | POA: Diagnosis not present

## 2022-10-29 DIAGNOSIS — Z992 Dependence on renal dialysis: Secondary | ICD-10-CM | POA: Diagnosis not present

## 2022-10-29 DIAGNOSIS — N186 End stage renal disease: Secondary | ICD-10-CM | POA: Diagnosis not present

## 2022-10-31 DIAGNOSIS — I129 Hypertensive chronic kidney disease with stage 1 through stage 4 chronic kidney disease, or unspecified chronic kidney disease: Secondary | ICD-10-CM | POA: Diagnosis not present

## 2022-10-31 DIAGNOSIS — Z992 Dependence on renal dialysis: Secondary | ICD-10-CM | POA: Diagnosis not present

## 2022-10-31 DIAGNOSIS — N2581 Secondary hyperparathyroidism of renal origin: Secondary | ICD-10-CM | POA: Diagnosis not present

## 2022-10-31 DIAGNOSIS — R52 Pain, unspecified: Secondary | ICD-10-CM | POA: Diagnosis not present

## 2022-10-31 DIAGNOSIS — N186 End stage renal disease: Secondary | ICD-10-CM | POA: Diagnosis not present

## 2022-10-31 DIAGNOSIS — D689 Coagulation defect, unspecified: Secondary | ICD-10-CM | POA: Diagnosis not present

## 2022-11-03 DIAGNOSIS — R52 Pain, unspecified: Secondary | ICD-10-CM | POA: Diagnosis not present

## 2022-11-03 DIAGNOSIS — N2581 Secondary hyperparathyroidism of renal origin: Secondary | ICD-10-CM | POA: Diagnosis not present

## 2022-11-03 DIAGNOSIS — N186 End stage renal disease: Secondary | ICD-10-CM | POA: Diagnosis not present

## 2022-11-03 DIAGNOSIS — D689 Coagulation defect, unspecified: Secondary | ICD-10-CM | POA: Diagnosis not present

## 2022-11-03 DIAGNOSIS — Z992 Dependence on renal dialysis: Secondary | ICD-10-CM | POA: Diagnosis not present

## 2022-11-05 DIAGNOSIS — Z992 Dependence on renal dialysis: Secondary | ICD-10-CM | POA: Diagnosis not present

## 2022-11-05 DIAGNOSIS — N186 End stage renal disease: Secondary | ICD-10-CM | POA: Diagnosis not present

## 2022-11-05 DIAGNOSIS — N2581 Secondary hyperparathyroidism of renal origin: Secondary | ICD-10-CM | POA: Diagnosis not present

## 2022-11-05 DIAGNOSIS — D689 Coagulation defect, unspecified: Secondary | ICD-10-CM | POA: Diagnosis not present

## 2022-11-05 DIAGNOSIS — R52 Pain, unspecified: Secondary | ICD-10-CM | POA: Diagnosis not present

## 2022-11-07 DIAGNOSIS — D689 Coagulation defect, unspecified: Secondary | ICD-10-CM | POA: Diagnosis not present

## 2022-11-07 DIAGNOSIS — R52 Pain, unspecified: Secondary | ICD-10-CM | POA: Diagnosis not present

## 2022-11-07 DIAGNOSIS — N2581 Secondary hyperparathyroidism of renal origin: Secondary | ICD-10-CM | POA: Diagnosis not present

## 2022-11-07 DIAGNOSIS — N186 End stage renal disease: Secondary | ICD-10-CM | POA: Diagnosis not present

## 2022-11-07 DIAGNOSIS — Z992 Dependence on renal dialysis: Secondary | ICD-10-CM | POA: Diagnosis not present

## 2022-11-10 DIAGNOSIS — Z992 Dependence on renal dialysis: Secondary | ICD-10-CM | POA: Diagnosis not present

## 2022-11-10 DIAGNOSIS — N186 End stage renal disease: Secondary | ICD-10-CM | POA: Diagnosis not present

## 2022-11-10 DIAGNOSIS — N2581 Secondary hyperparathyroidism of renal origin: Secondary | ICD-10-CM | POA: Diagnosis not present

## 2022-11-10 DIAGNOSIS — R52 Pain, unspecified: Secondary | ICD-10-CM | POA: Diagnosis not present

## 2022-11-10 DIAGNOSIS — D689 Coagulation defect, unspecified: Secondary | ICD-10-CM | POA: Diagnosis not present

## 2022-11-12 DIAGNOSIS — N186 End stage renal disease: Secondary | ICD-10-CM | POA: Diagnosis not present

## 2022-11-12 DIAGNOSIS — D689 Coagulation defect, unspecified: Secondary | ICD-10-CM | POA: Diagnosis not present

## 2022-11-12 DIAGNOSIS — Z992 Dependence on renal dialysis: Secondary | ICD-10-CM | POA: Diagnosis not present

## 2022-11-12 DIAGNOSIS — R52 Pain, unspecified: Secondary | ICD-10-CM | POA: Diagnosis not present

## 2022-11-12 DIAGNOSIS — N2581 Secondary hyperparathyroidism of renal origin: Secondary | ICD-10-CM | POA: Diagnosis not present

## 2022-11-14 DIAGNOSIS — R52 Pain, unspecified: Secondary | ICD-10-CM | POA: Diagnosis not present

## 2022-11-14 DIAGNOSIS — N2581 Secondary hyperparathyroidism of renal origin: Secondary | ICD-10-CM | POA: Diagnosis not present

## 2022-11-14 DIAGNOSIS — Z992 Dependence on renal dialysis: Secondary | ICD-10-CM | POA: Diagnosis not present

## 2022-11-14 DIAGNOSIS — N186 End stage renal disease: Secondary | ICD-10-CM | POA: Diagnosis not present

## 2022-11-14 DIAGNOSIS — D689 Coagulation defect, unspecified: Secondary | ICD-10-CM | POA: Diagnosis not present

## 2022-11-17 DIAGNOSIS — N186 End stage renal disease: Secondary | ICD-10-CM | POA: Diagnosis not present

## 2022-11-17 DIAGNOSIS — N2581 Secondary hyperparathyroidism of renal origin: Secondary | ICD-10-CM | POA: Diagnosis not present

## 2022-11-17 DIAGNOSIS — D689 Coagulation defect, unspecified: Secondary | ICD-10-CM | POA: Diagnosis not present

## 2022-11-17 DIAGNOSIS — R52 Pain, unspecified: Secondary | ICD-10-CM | POA: Diagnosis not present

## 2022-11-17 DIAGNOSIS — Z992 Dependence on renal dialysis: Secondary | ICD-10-CM | POA: Diagnosis not present

## 2022-11-19 DIAGNOSIS — E1122 Type 2 diabetes mellitus with diabetic chronic kidney disease: Secondary | ICD-10-CM | POA: Diagnosis not present

## 2022-11-19 DIAGNOSIS — Z992 Dependence on renal dialysis: Secondary | ICD-10-CM | POA: Diagnosis not present

## 2022-11-19 DIAGNOSIS — D689 Coagulation defect, unspecified: Secondary | ICD-10-CM | POA: Diagnosis not present

## 2022-11-19 DIAGNOSIS — N2581 Secondary hyperparathyroidism of renal origin: Secondary | ICD-10-CM | POA: Diagnosis not present

## 2022-11-19 DIAGNOSIS — N186 End stage renal disease: Secondary | ICD-10-CM | POA: Diagnosis not present

## 2022-11-19 DIAGNOSIS — R52 Pain, unspecified: Secondary | ICD-10-CM | POA: Diagnosis not present

## 2022-11-21 DIAGNOSIS — N2581 Secondary hyperparathyroidism of renal origin: Secondary | ICD-10-CM | POA: Diagnosis not present

## 2022-11-21 DIAGNOSIS — N186 End stage renal disease: Secondary | ICD-10-CM | POA: Diagnosis not present

## 2022-11-21 DIAGNOSIS — R52 Pain, unspecified: Secondary | ICD-10-CM | POA: Diagnosis not present

## 2022-11-21 DIAGNOSIS — Z992 Dependence on renal dialysis: Secondary | ICD-10-CM | POA: Diagnosis not present

## 2022-11-21 DIAGNOSIS — D689 Coagulation defect, unspecified: Secondary | ICD-10-CM | POA: Diagnosis not present

## 2022-11-24 DIAGNOSIS — N186 End stage renal disease: Secondary | ICD-10-CM | POA: Diagnosis not present

## 2022-11-24 DIAGNOSIS — N2581 Secondary hyperparathyroidism of renal origin: Secondary | ICD-10-CM | POA: Diagnosis not present

## 2022-11-24 DIAGNOSIS — Z992 Dependence on renal dialysis: Secondary | ICD-10-CM | POA: Diagnosis not present

## 2022-11-24 DIAGNOSIS — D689 Coagulation defect, unspecified: Secondary | ICD-10-CM | POA: Diagnosis not present

## 2022-11-24 DIAGNOSIS — R52 Pain, unspecified: Secondary | ICD-10-CM | POA: Diagnosis not present

## 2022-11-26 DIAGNOSIS — N2581 Secondary hyperparathyroidism of renal origin: Secondary | ICD-10-CM | POA: Diagnosis not present

## 2022-11-26 DIAGNOSIS — N186 End stage renal disease: Secondary | ICD-10-CM | POA: Diagnosis not present

## 2022-11-26 DIAGNOSIS — R52 Pain, unspecified: Secondary | ICD-10-CM | POA: Diagnosis not present

## 2022-11-26 DIAGNOSIS — Z992 Dependence on renal dialysis: Secondary | ICD-10-CM | POA: Diagnosis not present

## 2022-11-26 DIAGNOSIS — D689 Coagulation defect, unspecified: Secondary | ICD-10-CM | POA: Diagnosis not present

## 2022-11-28 DIAGNOSIS — N186 End stage renal disease: Secondary | ICD-10-CM | POA: Diagnosis not present

## 2022-11-28 DIAGNOSIS — D689 Coagulation defect, unspecified: Secondary | ICD-10-CM | POA: Diagnosis not present

## 2022-11-28 DIAGNOSIS — Z992 Dependence on renal dialysis: Secondary | ICD-10-CM | POA: Diagnosis not present

## 2022-11-28 DIAGNOSIS — R52 Pain, unspecified: Secondary | ICD-10-CM | POA: Diagnosis not present

## 2022-11-28 DIAGNOSIS — N2581 Secondary hyperparathyroidism of renal origin: Secondary | ICD-10-CM | POA: Diagnosis not present

## 2022-11-30 DIAGNOSIS — Z992 Dependence on renal dialysis: Secondary | ICD-10-CM | POA: Diagnosis not present

## 2022-11-30 DIAGNOSIS — N186 End stage renal disease: Secondary | ICD-10-CM | POA: Diagnosis not present

## 2022-11-30 DIAGNOSIS — I129 Hypertensive chronic kidney disease with stage 1 through stage 4 chronic kidney disease, or unspecified chronic kidney disease: Secondary | ICD-10-CM | POA: Diagnosis not present

## 2022-12-01 DIAGNOSIS — E1122 Type 2 diabetes mellitus with diabetic chronic kidney disease: Secondary | ICD-10-CM | POA: Diagnosis not present

## 2022-12-01 DIAGNOSIS — D631 Anemia in chronic kidney disease: Secondary | ICD-10-CM | POA: Diagnosis not present

## 2022-12-01 DIAGNOSIS — N186 End stage renal disease: Secondary | ICD-10-CM | POA: Diagnosis not present

## 2022-12-01 DIAGNOSIS — D509 Iron deficiency anemia, unspecified: Secondary | ICD-10-CM | POA: Diagnosis not present

## 2022-12-01 DIAGNOSIS — D689 Coagulation defect, unspecified: Secondary | ICD-10-CM | POA: Diagnosis not present

## 2022-12-01 DIAGNOSIS — Z992 Dependence on renal dialysis: Secondary | ICD-10-CM | POA: Diagnosis not present

## 2022-12-01 DIAGNOSIS — R52 Pain, unspecified: Secondary | ICD-10-CM | POA: Diagnosis not present

## 2022-12-01 DIAGNOSIS — N2581 Secondary hyperparathyroidism of renal origin: Secondary | ICD-10-CM | POA: Diagnosis not present

## 2022-12-03 DIAGNOSIS — D509 Iron deficiency anemia, unspecified: Secondary | ICD-10-CM | POA: Diagnosis not present

## 2022-12-03 DIAGNOSIS — D689 Coagulation defect, unspecified: Secondary | ICD-10-CM | POA: Diagnosis not present

## 2022-12-03 DIAGNOSIS — D631 Anemia in chronic kidney disease: Secondary | ICD-10-CM | POA: Diagnosis not present

## 2022-12-03 DIAGNOSIS — R52 Pain, unspecified: Secondary | ICD-10-CM | POA: Diagnosis not present

## 2022-12-03 DIAGNOSIS — N2581 Secondary hyperparathyroidism of renal origin: Secondary | ICD-10-CM | POA: Diagnosis not present

## 2022-12-03 DIAGNOSIS — Z992 Dependence on renal dialysis: Secondary | ICD-10-CM | POA: Diagnosis not present

## 2022-12-03 DIAGNOSIS — N186 End stage renal disease: Secondary | ICD-10-CM | POA: Diagnosis not present

## 2022-12-03 DIAGNOSIS — E1122 Type 2 diabetes mellitus with diabetic chronic kidney disease: Secondary | ICD-10-CM | POA: Diagnosis not present

## 2022-12-05 DIAGNOSIS — D631 Anemia in chronic kidney disease: Secondary | ICD-10-CM | POA: Diagnosis not present

## 2022-12-05 DIAGNOSIS — E1122 Type 2 diabetes mellitus with diabetic chronic kidney disease: Secondary | ICD-10-CM | POA: Diagnosis not present

## 2022-12-05 DIAGNOSIS — Z992 Dependence on renal dialysis: Secondary | ICD-10-CM | POA: Diagnosis not present

## 2022-12-05 DIAGNOSIS — D689 Coagulation defect, unspecified: Secondary | ICD-10-CM | POA: Diagnosis not present

## 2022-12-05 DIAGNOSIS — N186 End stage renal disease: Secondary | ICD-10-CM | POA: Diagnosis not present

## 2022-12-05 DIAGNOSIS — R52 Pain, unspecified: Secondary | ICD-10-CM | POA: Diagnosis not present

## 2022-12-05 DIAGNOSIS — D509 Iron deficiency anemia, unspecified: Secondary | ICD-10-CM | POA: Diagnosis not present

## 2022-12-05 DIAGNOSIS — N2581 Secondary hyperparathyroidism of renal origin: Secondary | ICD-10-CM | POA: Diagnosis not present

## 2022-12-08 DIAGNOSIS — Z992 Dependence on renal dialysis: Secondary | ICD-10-CM | POA: Diagnosis not present

## 2022-12-08 DIAGNOSIS — E1122 Type 2 diabetes mellitus with diabetic chronic kidney disease: Secondary | ICD-10-CM | POA: Diagnosis not present

## 2022-12-08 DIAGNOSIS — R52 Pain, unspecified: Secondary | ICD-10-CM | POA: Diagnosis not present

## 2022-12-08 DIAGNOSIS — D689 Coagulation defect, unspecified: Secondary | ICD-10-CM | POA: Diagnosis not present

## 2022-12-08 DIAGNOSIS — D509 Iron deficiency anemia, unspecified: Secondary | ICD-10-CM | POA: Diagnosis not present

## 2022-12-08 DIAGNOSIS — D631 Anemia in chronic kidney disease: Secondary | ICD-10-CM | POA: Diagnosis not present

## 2022-12-08 DIAGNOSIS — N186 End stage renal disease: Secondary | ICD-10-CM | POA: Diagnosis not present

## 2022-12-08 DIAGNOSIS — N2581 Secondary hyperparathyroidism of renal origin: Secondary | ICD-10-CM | POA: Diagnosis not present

## 2022-12-10 DIAGNOSIS — E1122 Type 2 diabetes mellitus with diabetic chronic kidney disease: Secondary | ICD-10-CM | POA: Diagnosis not present

## 2022-12-10 DIAGNOSIS — D509 Iron deficiency anemia, unspecified: Secondary | ICD-10-CM | POA: Diagnosis not present

## 2022-12-10 DIAGNOSIS — D631 Anemia in chronic kidney disease: Secondary | ICD-10-CM | POA: Diagnosis not present

## 2022-12-10 DIAGNOSIS — Z992 Dependence on renal dialysis: Secondary | ICD-10-CM | POA: Diagnosis not present

## 2022-12-10 DIAGNOSIS — D689 Coagulation defect, unspecified: Secondary | ICD-10-CM | POA: Diagnosis not present

## 2022-12-10 DIAGNOSIS — N2581 Secondary hyperparathyroidism of renal origin: Secondary | ICD-10-CM | POA: Diagnosis not present

## 2022-12-10 DIAGNOSIS — N186 End stage renal disease: Secondary | ICD-10-CM | POA: Diagnosis not present

## 2022-12-10 DIAGNOSIS — R52 Pain, unspecified: Secondary | ICD-10-CM | POA: Diagnosis not present

## 2022-12-12 DIAGNOSIS — N2581 Secondary hyperparathyroidism of renal origin: Secondary | ICD-10-CM | POA: Diagnosis not present

## 2022-12-12 DIAGNOSIS — D689 Coagulation defect, unspecified: Secondary | ICD-10-CM | POA: Diagnosis not present

## 2022-12-12 DIAGNOSIS — N186 End stage renal disease: Secondary | ICD-10-CM | POA: Diagnosis not present

## 2022-12-12 DIAGNOSIS — R52 Pain, unspecified: Secondary | ICD-10-CM | POA: Diagnosis not present

## 2022-12-12 DIAGNOSIS — D631 Anemia in chronic kidney disease: Secondary | ICD-10-CM | POA: Diagnosis not present

## 2022-12-12 DIAGNOSIS — Z992 Dependence on renal dialysis: Secondary | ICD-10-CM | POA: Diagnosis not present

## 2022-12-12 DIAGNOSIS — E1122 Type 2 diabetes mellitus with diabetic chronic kidney disease: Secondary | ICD-10-CM | POA: Diagnosis not present

## 2022-12-12 DIAGNOSIS — D509 Iron deficiency anemia, unspecified: Secondary | ICD-10-CM | POA: Diagnosis not present

## 2022-12-15 DIAGNOSIS — N2581 Secondary hyperparathyroidism of renal origin: Secondary | ICD-10-CM | POA: Diagnosis not present

## 2022-12-15 DIAGNOSIS — D689 Coagulation defect, unspecified: Secondary | ICD-10-CM | POA: Diagnosis not present

## 2022-12-15 DIAGNOSIS — D509 Iron deficiency anemia, unspecified: Secondary | ICD-10-CM | POA: Diagnosis not present

## 2022-12-15 DIAGNOSIS — E1122 Type 2 diabetes mellitus with diabetic chronic kidney disease: Secondary | ICD-10-CM | POA: Diagnosis not present

## 2022-12-15 DIAGNOSIS — D631 Anemia in chronic kidney disease: Secondary | ICD-10-CM | POA: Diagnosis not present

## 2022-12-15 DIAGNOSIS — R52 Pain, unspecified: Secondary | ICD-10-CM | POA: Diagnosis not present

## 2022-12-15 DIAGNOSIS — Z992 Dependence on renal dialysis: Secondary | ICD-10-CM | POA: Diagnosis not present

## 2022-12-15 DIAGNOSIS — N186 End stage renal disease: Secondary | ICD-10-CM | POA: Diagnosis not present

## 2022-12-17 DIAGNOSIS — R52 Pain, unspecified: Secondary | ICD-10-CM | POA: Diagnosis not present

## 2022-12-17 DIAGNOSIS — N186 End stage renal disease: Secondary | ICD-10-CM | POA: Diagnosis not present

## 2022-12-17 DIAGNOSIS — Z992 Dependence on renal dialysis: Secondary | ICD-10-CM | POA: Diagnosis not present

## 2022-12-17 DIAGNOSIS — D509 Iron deficiency anemia, unspecified: Secondary | ICD-10-CM | POA: Diagnosis not present

## 2022-12-17 DIAGNOSIS — D689 Coagulation defect, unspecified: Secondary | ICD-10-CM | POA: Diagnosis not present

## 2022-12-17 DIAGNOSIS — D631 Anemia in chronic kidney disease: Secondary | ICD-10-CM | POA: Diagnosis not present

## 2022-12-17 DIAGNOSIS — E1122 Type 2 diabetes mellitus with diabetic chronic kidney disease: Secondary | ICD-10-CM | POA: Diagnosis not present

## 2022-12-17 DIAGNOSIS — N2581 Secondary hyperparathyroidism of renal origin: Secondary | ICD-10-CM | POA: Diagnosis not present

## 2022-12-18 DIAGNOSIS — E118 Type 2 diabetes mellitus with unspecified complications: Secondary | ICD-10-CM | POA: Diagnosis not present

## 2022-12-19 DIAGNOSIS — D689 Coagulation defect, unspecified: Secondary | ICD-10-CM | POA: Diagnosis not present

## 2022-12-19 DIAGNOSIS — D631 Anemia in chronic kidney disease: Secondary | ICD-10-CM | POA: Diagnosis not present

## 2022-12-19 DIAGNOSIS — E1122 Type 2 diabetes mellitus with diabetic chronic kidney disease: Secondary | ICD-10-CM | POA: Diagnosis not present

## 2022-12-19 DIAGNOSIS — N2581 Secondary hyperparathyroidism of renal origin: Secondary | ICD-10-CM | POA: Diagnosis not present

## 2022-12-19 DIAGNOSIS — N186 End stage renal disease: Secondary | ICD-10-CM | POA: Diagnosis not present

## 2022-12-19 DIAGNOSIS — R52 Pain, unspecified: Secondary | ICD-10-CM | POA: Diagnosis not present

## 2022-12-19 DIAGNOSIS — D509 Iron deficiency anemia, unspecified: Secondary | ICD-10-CM | POA: Diagnosis not present

## 2022-12-19 DIAGNOSIS — Z992 Dependence on renal dialysis: Secondary | ICD-10-CM | POA: Diagnosis not present

## 2022-12-22 DIAGNOSIS — D689 Coagulation defect, unspecified: Secondary | ICD-10-CM | POA: Diagnosis not present

## 2022-12-22 DIAGNOSIS — E1122 Type 2 diabetes mellitus with diabetic chronic kidney disease: Secondary | ICD-10-CM | POA: Diagnosis not present

## 2022-12-22 DIAGNOSIS — D631 Anemia in chronic kidney disease: Secondary | ICD-10-CM | POA: Diagnosis not present

## 2022-12-22 DIAGNOSIS — D509 Iron deficiency anemia, unspecified: Secondary | ICD-10-CM | POA: Diagnosis not present

## 2022-12-22 DIAGNOSIS — N186 End stage renal disease: Secondary | ICD-10-CM | POA: Diagnosis not present

## 2022-12-22 DIAGNOSIS — Z992 Dependence on renal dialysis: Secondary | ICD-10-CM | POA: Diagnosis not present

## 2022-12-22 DIAGNOSIS — R52 Pain, unspecified: Secondary | ICD-10-CM | POA: Diagnosis not present

## 2022-12-22 DIAGNOSIS — N2581 Secondary hyperparathyroidism of renal origin: Secondary | ICD-10-CM | POA: Diagnosis not present

## 2022-12-23 DIAGNOSIS — Z2821 Immunization not carried out because of patient refusal: Secondary | ICD-10-CM | POA: Diagnosis not present

## 2022-12-23 DIAGNOSIS — G4733 Obstructive sleep apnea (adult) (pediatric): Secondary | ICD-10-CM | POA: Diagnosis not present

## 2022-12-23 DIAGNOSIS — N186 End stage renal disease: Secondary | ICD-10-CM | POA: Diagnosis not present

## 2022-12-23 DIAGNOSIS — I10 Essential (primary) hypertension: Secondary | ICD-10-CM | POA: Diagnosis not present

## 2022-12-23 DIAGNOSIS — Z86010 Personal history of colonic polyps: Secondary | ICD-10-CM | POA: Diagnosis not present

## 2022-12-23 DIAGNOSIS — E1122 Type 2 diabetes mellitus with diabetic chronic kidney disease: Secondary | ICD-10-CM | POA: Diagnosis not present

## 2022-12-23 DIAGNOSIS — I5032 Chronic diastolic (congestive) heart failure: Secondary | ICD-10-CM | POA: Diagnosis not present

## 2022-12-23 DIAGNOSIS — Z8739 Personal history of other diseases of the musculoskeletal system and connective tissue: Secondary | ICD-10-CM | POA: Diagnosis not present

## 2022-12-23 DIAGNOSIS — N2581 Secondary hyperparathyroidism of renal origin: Secondary | ICD-10-CM | POA: Diagnosis not present

## 2022-12-23 DIAGNOSIS — Z0000 Encounter for general adult medical examination without abnormal findings: Secondary | ICD-10-CM | POA: Diagnosis not present

## 2022-12-23 DIAGNOSIS — R29898 Other symptoms and signs involving the musculoskeletal system: Secondary | ICD-10-CM | POA: Diagnosis not present

## 2022-12-23 DIAGNOSIS — M791 Myalgia, unspecified site: Secondary | ICD-10-CM | POA: Diagnosis not present

## 2022-12-24 DIAGNOSIS — Z992 Dependence on renal dialysis: Secondary | ICD-10-CM | POA: Diagnosis not present

## 2022-12-24 DIAGNOSIS — D631 Anemia in chronic kidney disease: Secondary | ICD-10-CM | POA: Diagnosis not present

## 2022-12-24 DIAGNOSIS — D509 Iron deficiency anemia, unspecified: Secondary | ICD-10-CM | POA: Diagnosis not present

## 2022-12-24 DIAGNOSIS — R52 Pain, unspecified: Secondary | ICD-10-CM | POA: Diagnosis not present

## 2022-12-24 DIAGNOSIS — N186 End stage renal disease: Secondary | ICD-10-CM | POA: Diagnosis not present

## 2022-12-24 DIAGNOSIS — E1122 Type 2 diabetes mellitus with diabetic chronic kidney disease: Secondary | ICD-10-CM | POA: Diagnosis not present

## 2022-12-24 DIAGNOSIS — D689 Coagulation defect, unspecified: Secondary | ICD-10-CM | POA: Diagnosis not present

## 2022-12-24 DIAGNOSIS — N2581 Secondary hyperparathyroidism of renal origin: Secondary | ICD-10-CM | POA: Diagnosis not present

## 2022-12-26 DIAGNOSIS — N2581 Secondary hyperparathyroidism of renal origin: Secondary | ICD-10-CM | POA: Diagnosis not present

## 2022-12-26 DIAGNOSIS — E1122 Type 2 diabetes mellitus with diabetic chronic kidney disease: Secondary | ICD-10-CM | POA: Diagnosis not present

## 2022-12-26 DIAGNOSIS — D631 Anemia in chronic kidney disease: Secondary | ICD-10-CM | POA: Diagnosis not present

## 2022-12-26 DIAGNOSIS — Z992 Dependence on renal dialysis: Secondary | ICD-10-CM | POA: Diagnosis not present

## 2022-12-26 DIAGNOSIS — D689 Coagulation defect, unspecified: Secondary | ICD-10-CM | POA: Diagnosis not present

## 2022-12-26 DIAGNOSIS — N186 End stage renal disease: Secondary | ICD-10-CM | POA: Diagnosis not present

## 2022-12-26 DIAGNOSIS — D509 Iron deficiency anemia, unspecified: Secondary | ICD-10-CM | POA: Diagnosis not present

## 2022-12-26 DIAGNOSIS — R52 Pain, unspecified: Secondary | ICD-10-CM | POA: Diagnosis not present

## 2022-12-29 DIAGNOSIS — N2581 Secondary hyperparathyroidism of renal origin: Secondary | ICD-10-CM | POA: Diagnosis not present

## 2022-12-29 DIAGNOSIS — E1122 Type 2 diabetes mellitus with diabetic chronic kidney disease: Secondary | ICD-10-CM | POA: Diagnosis not present

## 2022-12-29 DIAGNOSIS — Z992 Dependence on renal dialysis: Secondary | ICD-10-CM | POA: Diagnosis not present

## 2022-12-29 DIAGNOSIS — D631 Anemia in chronic kidney disease: Secondary | ICD-10-CM | POA: Diagnosis not present

## 2022-12-29 DIAGNOSIS — D689 Coagulation defect, unspecified: Secondary | ICD-10-CM | POA: Diagnosis not present

## 2022-12-29 DIAGNOSIS — R52 Pain, unspecified: Secondary | ICD-10-CM | POA: Diagnosis not present

## 2022-12-29 DIAGNOSIS — D509 Iron deficiency anemia, unspecified: Secondary | ICD-10-CM | POA: Diagnosis not present

## 2022-12-29 DIAGNOSIS — N186 End stage renal disease: Secondary | ICD-10-CM | POA: Diagnosis not present

## 2022-12-30 ENCOUNTER — Ambulatory Visit: Payer: Medicare Other | Admitting: Podiatry

## 2022-12-31 DIAGNOSIS — N2581 Secondary hyperparathyroidism of renal origin: Secondary | ICD-10-CM | POA: Diagnosis not present

## 2022-12-31 DIAGNOSIS — Z992 Dependence on renal dialysis: Secondary | ICD-10-CM | POA: Diagnosis not present

## 2022-12-31 DIAGNOSIS — D509 Iron deficiency anemia, unspecified: Secondary | ICD-10-CM | POA: Diagnosis not present

## 2022-12-31 DIAGNOSIS — E1122 Type 2 diabetes mellitus with diabetic chronic kidney disease: Secondary | ICD-10-CM | POA: Diagnosis not present

## 2022-12-31 DIAGNOSIS — N186 End stage renal disease: Secondary | ICD-10-CM | POA: Diagnosis not present

## 2022-12-31 DIAGNOSIS — I129 Hypertensive chronic kidney disease with stage 1 through stage 4 chronic kidney disease, or unspecified chronic kidney disease: Secondary | ICD-10-CM | POA: Diagnosis not present

## 2022-12-31 DIAGNOSIS — D631 Anemia in chronic kidney disease: Secondary | ICD-10-CM | POA: Diagnosis not present

## 2022-12-31 DIAGNOSIS — D689 Coagulation defect, unspecified: Secondary | ICD-10-CM | POA: Diagnosis not present

## 2022-12-31 DIAGNOSIS — R52 Pain, unspecified: Secondary | ICD-10-CM | POA: Diagnosis not present

## 2023-01-02 DIAGNOSIS — Z992 Dependence on renal dialysis: Secondary | ICD-10-CM | POA: Diagnosis not present

## 2023-01-02 DIAGNOSIS — N186 End stage renal disease: Secondary | ICD-10-CM | POA: Diagnosis not present

## 2023-01-02 DIAGNOSIS — D689 Coagulation defect, unspecified: Secondary | ICD-10-CM | POA: Diagnosis not present

## 2023-01-02 DIAGNOSIS — R52 Pain, unspecified: Secondary | ICD-10-CM | POA: Diagnosis not present

## 2023-01-02 DIAGNOSIS — N2581 Secondary hyperparathyroidism of renal origin: Secondary | ICD-10-CM | POA: Diagnosis not present

## 2023-01-05 DIAGNOSIS — N186 End stage renal disease: Secondary | ICD-10-CM | POA: Diagnosis not present

## 2023-01-05 DIAGNOSIS — D689 Coagulation defect, unspecified: Secondary | ICD-10-CM | POA: Diagnosis not present

## 2023-01-05 DIAGNOSIS — Z992 Dependence on renal dialysis: Secondary | ICD-10-CM | POA: Diagnosis not present

## 2023-01-05 DIAGNOSIS — R52 Pain, unspecified: Secondary | ICD-10-CM | POA: Diagnosis not present

## 2023-01-05 DIAGNOSIS — N2581 Secondary hyperparathyroidism of renal origin: Secondary | ICD-10-CM | POA: Diagnosis not present

## 2023-01-07 DIAGNOSIS — D689 Coagulation defect, unspecified: Secondary | ICD-10-CM | POA: Diagnosis not present

## 2023-01-07 DIAGNOSIS — Z992 Dependence on renal dialysis: Secondary | ICD-10-CM | POA: Diagnosis not present

## 2023-01-07 DIAGNOSIS — R52 Pain, unspecified: Secondary | ICD-10-CM | POA: Diagnosis not present

## 2023-01-07 DIAGNOSIS — N2581 Secondary hyperparathyroidism of renal origin: Secondary | ICD-10-CM | POA: Diagnosis not present

## 2023-01-07 DIAGNOSIS — N186 End stage renal disease: Secondary | ICD-10-CM | POA: Diagnosis not present

## 2023-01-09 DIAGNOSIS — Z992 Dependence on renal dialysis: Secondary | ICD-10-CM | POA: Diagnosis not present

## 2023-01-09 DIAGNOSIS — R52 Pain, unspecified: Secondary | ICD-10-CM | POA: Diagnosis not present

## 2023-01-09 DIAGNOSIS — N186 End stage renal disease: Secondary | ICD-10-CM | POA: Diagnosis not present

## 2023-01-09 DIAGNOSIS — N2581 Secondary hyperparathyroidism of renal origin: Secondary | ICD-10-CM | POA: Diagnosis not present

## 2023-01-09 DIAGNOSIS — D689 Coagulation defect, unspecified: Secondary | ICD-10-CM | POA: Diagnosis not present

## 2023-01-12 DIAGNOSIS — Z992 Dependence on renal dialysis: Secondary | ICD-10-CM | POA: Diagnosis not present

## 2023-01-12 DIAGNOSIS — R52 Pain, unspecified: Secondary | ICD-10-CM | POA: Diagnosis not present

## 2023-01-12 DIAGNOSIS — N2581 Secondary hyperparathyroidism of renal origin: Secondary | ICD-10-CM | POA: Diagnosis not present

## 2023-01-12 DIAGNOSIS — N186 End stage renal disease: Secondary | ICD-10-CM | POA: Diagnosis not present

## 2023-01-12 DIAGNOSIS — D689 Coagulation defect, unspecified: Secondary | ICD-10-CM | POA: Diagnosis not present

## 2023-01-14 DIAGNOSIS — R52 Pain, unspecified: Secondary | ICD-10-CM | POA: Diagnosis not present

## 2023-01-14 DIAGNOSIS — D689 Coagulation defect, unspecified: Secondary | ICD-10-CM | POA: Diagnosis not present

## 2023-01-14 DIAGNOSIS — N2581 Secondary hyperparathyroidism of renal origin: Secondary | ICD-10-CM | POA: Diagnosis not present

## 2023-01-14 DIAGNOSIS — Z992 Dependence on renal dialysis: Secondary | ICD-10-CM | POA: Diagnosis not present

## 2023-01-14 DIAGNOSIS — N186 End stage renal disease: Secondary | ICD-10-CM | POA: Diagnosis not present

## 2023-01-16 DIAGNOSIS — Z992 Dependence on renal dialysis: Secondary | ICD-10-CM | POA: Diagnosis not present

## 2023-01-16 DIAGNOSIS — R52 Pain, unspecified: Secondary | ICD-10-CM | POA: Diagnosis not present

## 2023-01-16 DIAGNOSIS — N2581 Secondary hyperparathyroidism of renal origin: Secondary | ICD-10-CM | POA: Diagnosis not present

## 2023-01-16 DIAGNOSIS — N186 End stage renal disease: Secondary | ICD-10-CM | POA: Diagnosis not present

## 2023-01-16 DIAGNOSIS — D689 Coagulation defect, unspecified: Secondary | ICD-10-CM | POA: Diagnosis not present

## 2023-01-19 DIAGNOSIS — N186 End stage renal disease: Secondary | ICD-10-CM | POA: Diagnosis not present

## 2023-01-19 DIAGNOSIS — N2581 Secondary hyperparathyroidism of renal origin: Secondary | ICD-10-CM | POA: Diagnosis not present

## 2023-01-19 DIAGNOSIS — D689 Coagulation defect, unspecified: Secondary | ICD-10-CM | POA: Diagnosis not present

## 2023-01-19 DIAGNOSIS — R52 Pain, unspecified: Secondary | ICD-10-CM | POA: Diagnosis not present

## 2023-01-19 DIAGNOSIS — Z992 Dependence on renal dialysis: Secondary | ICD-10-CM | POA: Diagnosis not present

## 2023-01-21 DIAGNOSIS — N186 End stage renal disease: Secondary | ICD-10-CM | POA: Diagnosis not present

## 2023-01-21 DIAGNOSIS — D689 Coagulation defect, unspecified: Secondary | ICD-10-CM | POA: Diagnosis not present

## 2023-01-21 DIAGNOSIS — Z992 Dependence on renal dialysis: Secondary | ICD-10-CM | POA: Diagnosis not present

## 2023-01-21 DIAGNOSIS — N2581 Secondary hyperparathyroidism of renal origin: Secondary | ICD-10-CM | POA: Diagnosis not present

## 2023-01-21 DIAGNOSIS — R52 Pain, unspecified: Secondary | ICD-10-CM | POA: Diagnosis not present

## 2023-01-22 DIAGNOSIS — I110 Hypertensive heart disease with heart failure: Secondary | ICD-10-CM | POA: Diagnosis not present

## 2023-01-22 DIAGNOSIS — M25562 Pain in left knee: Secondary | ICD-10-CM | POA: Diagnosis not present

## 2023-01-22 DIAGNOSIS — I5032 Chronic diastolic (congestive) heart failure: Secondary | ICD-10-CM | POA: Diagnosis not present

## 2023-01-22 DIAGNOSIS — G4733 Obstructive sleep apnea (adult) (pediatric): Secondary | ICD-10-CM | POA: Diagnosis not present

## 2023-01-22 DIAGNOSIS — R262 Difficulty in walking, not elsewhere classified: Secondary | ICD-10-CM | POA: Diagnosis not present

## 2023-01-22 DIAGNOSIS — Z992 Dependence on renal dialysis: Secondary | ICD-10-CM | POA: Diagnosis not present

## 2023-01-22 DIAGNOSIS — N186 End stage renal disease: Secondary | ICD-10-CM | POA: Diagnosis not present

## 2023-01-22 DIAGNOSIS — Z7984 Long term (current) use of oral hypoglycemic drugs: Secondary | ICD-10-CM | POA: Diagnosis not present

## 2023-01-22 DIAGNOSIS — Z794 Long term (current) use of insulin: Secondary | ICD-10-CM | POA: Diagnosis not present

## 2023-01-22 DIAGNOSIS — Z791 Long term (current) use of non-steroidal anti-inflammatories (NSAID): Secondary | ICD-10-CM | POA: Diagnosis not present

## 2023-01-22 DIAGNOSIS — M791 Myalgia, unspecified site: Secondary | ICD-10-CM | POA: Diagnosis not present

## 2023-01-22 DIAGNOSIS — Z86010 Personal history of colonic polyps: Secondary | ICD-10-CM | POA: Diagnosis not present

## 2023-01-22 DIAGNOSIS — M25561 Pain in right knee: Secondary | ICD-10-CM | POA: Diagnosis not present

## 2023-01-22 DIAGNOSIS — E7800 Pure hypercholesterolemia, unspecified: Secondary | ICD-10-CM | POA: Diagnosis not present

## 2023-01-22 DIAGNOSIS — Z9181 History of falling: Secondary | ICD-10-CM | POA: Diagnosis not present

## 2023-01-22 DIAGNOSIS — N2581 Secondary hyperparathyroidism of renal origin: Secondary | ICD-10-CM | POA: Diagnosis not present

## 2023-01-22 DIAGNOSIS — R29898 Other symptoms and signs involving the musculoskeletal system: Secondary | ICD-10-CM | POA: Diagnosis not present

## 2023-01-22 DIAGNOSIS — E1122 Type 2 diabetes mellitus with diabetic chronic kidney disease: Secondary | ICD-10-CM | POA: Diagnosis not present

## 2023-01-22 DIAGNOSIS — M1030 Gout due to renal impairment, unspecified site: Secondary | ICD-10-CM | POA: Diagnosis not present

## 2023-01-23 DIAGNOSIS — N2581 Secondary hyperparathyroidism of renal origin: Secondary | ICD-10-CM | POA: Diagnosis not present

## 2023-01-23 DIAGNOSIS — Z992 Dependence on renal dialysis: Secondary | ICD-10-CM | POA: Diagnosis not present

## 2023-01-23 DIAGNOSIS — R52 Pain, unspecified: Secondary | ICD-10-CM | POA: Diagnosis not present

## 2023-01-23 DIAGNOSIS — N186 End stage renal disease: Secondary | ICD-10-CM | POA: Diagnosis not present

## 2023-01-23 DIAGNOSIS — D689 Coagulation defect, unspecified: Secondary | ICD-10-CM | POA: Diagnosis not present

## 2023-01-26 DIAGNOSIS — N2581 Secondary hyperparathyroidism of renal origin: Secondary | ICD-10-CM | POA: Diagnosis not present

## 2023-01-26 DIAGNOSIS — N186 End stage renal disease: Secondary | ICD-10-CM | POA: Diagnosis not present

## 2023-01-26 DIAGNOSIS — D689 Coagulation defect, unspecified: Secondary | ICD-10-CM | POA: Diagnosis not present

## 2023-01-26 DIAGNOSIS — R52 Pain, unspecified: Secondary | ICD-10-CM | POA: Diagnosis not present

## 2023-01-26 DIAGNOSIS — Z992 Dependence on renal dialysis: Secondary | ICD-10-CM | POA: Diagnosis not present

## 2023-01-27 DIAGNOSIS — E7800 Pure hypercholesterolemia, unspecified: Secondary | ICD-10-CM | POA: Diagnosis not present

## 2023-01-27 DIAGNOSIS — N2581 Secondary hyperparathyroidism of renal origin: Secondary | ICD-10-CM | POA: Diagnosis not present

## 2023-01-27 DIAGNOSIS — I110 Hypertensive heart disease with heart failure: Secondary | ICD-10-CM | POA: Diagnosis not present

## 2023-01-27 DIAGNOSIS — G4733 Obstructive sleep apnea (adult) (pediatric): Secondary | ICD-10-CM | POA: Diagnosis not present

## 2023-01-27 DIAGNOSIS — M1030 Gout due to renal impairment, unspecified site: Secondary | ICD-10-CM | POA: Diagnosis not present

## 2023-01-27 DIAGNOSIS — M25562 Pain in left knee: Secondary | ICD-10-CM | POA: Diagnosis not present

## 2023-01-27 DIAGNOSIS — R262 Difficulty in walking, not elsewhere classified: Secondary | ICD-10-CM | POA: Diagnosis not present

## 2023-01-27 DIAGNOSIS — Z7984 Long term (current) use of oral hypoglycemic drugs: Secondary | ICD-10-CM | POA: Diagnosis not present

## 2023-01-27 DIAGNOSIS — M791 Myalgia, unspecified site: Secondary | ICD-10-CM | POA: Diagnosis not present

## 2023-01-27 DIAGNOSIS — N186 End stage renal disease: Secondary | ICD-10-CM | POA: Diagnosis not present

## 2023-01-27 DIAGNOSIS — Z9181 History of falling: Secondary | ICD-10-CM | POA: Diagnosis not present

## 2023-01-27 DIAGNOSIS — Z791 Long term (current) use of non-steroidal anti-inflammatories (NSAID): Secondary | ICD-10-CM | POA: Diagnosis not present

## 2023-01-27 DIAGNOSIS — Z992 Dependence on renal dialysis: Secondary | ICD-10-CM | POA: Diagnosis not present

## 2023-01-27 DIAGNOSIS — M25561 Pain in right knee: Secondary | ICD-10-CM | POA: Diagnosis not present

## 2023-01-27 DIAGNOSIS — I5032 Chronic diastolic (congestive) heart failure: Secondary | ICD-10-CM | POA: Diagnosis not present

## 2023-01-27 DIAGNOSIS — E1122 Type 2 diabetes mellitus with diabetic chronic kidney disease: Secondary | ICD-10-CM | POA: Diagnosis not present

## 2023-01-27 DIAGNOSIS — Z794 Long term (current) use of insulin: Secondary | ICD-10-CM | POA: Diagnosis not present

## 2023-01-27 DIAGNOSIS — Z86010 Personal history of colonic polyps: Secondary | ICD-10-CM | POA: Diagnosis not present

## 2023-01-27 DIAGNOSIS — R29898 Other symptoms and signs involving the musculoskeletal system: Secondary | ICD-10-CM | POA: Diagnosis not present

## 2023-01-28 DIAGNOSIS — N186 End stage renal disease: Secondary | ICD-10-CM | POA: Diagnosis not present

## 2023-01-28 DIAGNOSIS — N2581 Secondary hyperparathyroidism of renal origin: Secondary | ICD-10-CM | POA: Diagnosis not present

## 2023-01-28 DIAGNOSIS — D689 Coagulation defect, unspecified: Secondary | ICD-10-CM | POA: Diagnosis not present

## 2023-01-28 DIAGNOSIS — Z992 Dependence on renal dialysis: Secondary | ICD-10-CM | POA: Diagnosis not present

## 2023-01-28 DIAGNOSIS — R52 Pain, unspecified: Secondary | ICD-10-CM | POA: Diagnosis not present

## 2023-01-30 DIAGNOSIS — N186 End stage renal disease: Secondary | ICD-10-CM | POA: Diagnosis not present

## 2023-01-30 DIAGNOSIS — R52 Pain, unspecified: Secondary | ICD-10-CM | POA: Diagnosis not present

## 2023-01-30 DIAGNOSIS — N2581 Secondary hyperparathyroidism of renal origin: Secondary | ICD-10-CM | POA: Diagnosis not present

## 2023-01-30 DIAGNOSIS — D689 Coagulation defect, unspecified: Secondary | ICD-10-CM | POA: Diagnosis not present

## 2023-01-30 DIAGNOSIS — Z992 Dependence on renal dialysis: Secondary | ICD-10-CM | POA: Diagnosis not present

## 2023-01-31 DIAGNOSIS — I129 Hypertensive chronic kidney disease with stage 1 through stage 4 chronic kidney disease, or unspecified chronic kidney disease: Secondary | ICD-10-CM | POA: Diagnosis not present

## 2023-01-31 DIAGNOSIS — Z992 Dependence on renal dialysis: Secondary | ICD-10-CM | POA: Diagnosis not present

## 2023-01-31 DIAGNOSIS — N186 End stage renal disease: Secondary | ICD-10-CM | POA: Diagnosis not present

## 2023-02-02 DIAGNOSIS — Z992 Dependence on renal dialysis: Secondary | ICD-10-CM | POA: Diagnosis not present

## 2023-02-02 DIAGNOSIS — N186 End stage renal disease: Secondary | ICD-10-CM | POA: Diagnosis not present

## 2023-02-02 DIAGNOSIS — N2581 Secondary hyperparathyroidism of renal origin: Secondary | ICD-10-CM | POA: Diagnosis not present

## 2023-02-02 DIAGNOSIS — D689 Coagulation defect, unspecified: Secondary | ICD-10-CM | POA: Diagnosis not present

## 2023-02-02 DIAGNOSIS — R52 Pain, unspecified: Secondary | ICD-10-CM | POA: Diagnosis not present

## 2023-02-04 DIAGNOSIS — Z992 Dependence on renal dialysis: Secondary | ICD-10-CM | POA: Diagnosis not present

## 2023-02-04 DIAGNOSIS — N2581 Secondary hyperparathyroidism of renal origin: Secondary | ICD-10-CM | POA: Diagnosis not present

## 2023-02-04 DIAGNOSIS — N186 End stage renal disease: Secondary | ICD-10-CM | POA: Diagnosis not present

## 2023-02-04 DIAGNOSIS — D689 Coagulation defect, unspecified: Secondary | ICD-10-CM | POA: Diagnosis not present

## 2023-02-04 DIAGNOSIS — R52 Pain, unspecified: Secondary | ICD-10-CM | POA: Diagnosis not present

## 2023-02-06 DIAGNOSIS — N186 End stage renal disease: Secondary | ICD-10-CM | POA: Diagnosis not present

## 2023-02-06 DIAGNOSIS — Z992 Dependence on renal dialysis: Secondary | ICD-10-CM | POA: Diagnosis not present

## 2023-02-06 DIAGNOSIS — D689 Coagulation defect, unspecified: Secondary | ICD-10-CM | POA: Diagnosis not present

## 2023-02-06 DIAGNOSIS — R52 Pain, unspecified: Secondary | ICD-10-CM | POA: Diagnosis not present

## 2023-02-06 DIAGNOSIS — N2581 Secondary hyperparathyroidism of renal origin: Secondary | ICD-10-CM | POA: Diagnosis not present

## 2023-02-09 DIAGNOSIS — D689 Coagulation defect, unspecified: Secondary | ICD-10-CM | POA: Diagnosis not present

## 2023-02-09 DIAGNOSIS — Z992 Dependence on renal dialysis: Secondary | ICD-10-CM | POA: Diagnosis not present

## 2023-02-09 DIAGNOSIS — N2581 Secondary hyperparathyroidism of renal origin: Secondary | ICD-10-CM | POA: Diagnosis not present

## 2023-02-09 DIAGNOSIS — N186 End stage renal disease: Secondary | ICD-10-CM | POA: Diagnosis not present

## 2023-02-09 DIAGNOSIS — R52 Pain, unspecified: Secondary | ICD-10-CM | POA: Diagnosis not present

## 2023-02-11 DIAGNOSIS — N186 End stage renal disease: Secondary | ICD-10-CM | POA: Diagnosis not present

## 2023-02-11 DIAGNOSIS — D689 Coagulation defect, unspecified: Secondary | ICD-10-CM | POA: Diagnosis not present

## 2023-02-11 DIAGNOSIS — R52 Pain, unspecified: Secondary | ICD-10-CM | POA: Diagnosis not present

## 2023-02-11 DIAGNOSIS — N2581 Secondary hyperparathyroidism of renal origin: Secondary | ICD-10-CM | POA: Diagnosis not present

## 2023-02-11 DIAGNOSIS — Z992 Dependence on renal dialysis: Secondary | ICD-10-CM | POA: Diagnosis not present

## 2023-02-12 DIAGNOSIS — Z7984 Long term (current) use of oral hypoglycemic drugs: Secondary | ICD-10-CM | POA: Diagnosis not present

## 2023-02-12 DIAGNOSIS — E1122 Type 2 diabetes mellitus with diabetic chronic kidney disease: Secondary | ICD-10-CM | POA: Diagnosis not present

## 2023-02-12 DIAGNOSIS — I5032 Chronic diastolic (congestive) heart failure: Secondary | ICD-10-CM | POA: Diagnosis not present

## 2023-02-12 DIAGNOSIS — E7800 Pure hypercholesterolemia, unspecified: Secondary | ICD-10-CM | POA: Diagnosis not present

## 2023-02-12 DIAGNOSIS — Z86010 Personal history of colonic polyps: Secondary | ICD-10-CM | POA: Diagnosis not present

## 2023-02-12 DIAGNOSIS — N186 End stage renal disease: Secondary | ICD-10-CM | POA: Diagnosis not present

## 2023-02-12 DIAGNOSIS — R262 Difficulty in walking, not elsewhere classified: Secondary | ICD-10-CM | POA: Diagnosis not present

## 2023-02-12 DIAGNOSIS — Z791 Long term (current) use of non-steroidal anti-inflammatories (NSAID): Secondary | ICD-10-CM | POA: Diagnosis not present

## 2023-02-12 DIAGNOSIS — N2581 Secondary hyperparathyroidism of renal origin: Secondary | ICD-10-CM | POA: Diagnosis not present

## 2023-02-12 DIAGNOSIS — I110 Hypertensive heart disease with heart failure: Secondary | ICD-10-CM | POA: Diagnosis not present

## 2023-02-12 DIAGNOSIS — Z992 Dependence on renal dialysis: Secondary | ICD-10-CM | POA: Diagnosis not present

## 2023-02-12 DIAGNOSIS — R29898 Other symptoms and signs involving the musculoskeletal system: Secondary | ICD-10-CM | POA: Diagnosis not present

## 2023-02-12 DIAGNOSIS — Z9181 History of falling: Secondary | ICD-10-CM | POA: Diagnosis not present

## 2023-02-12 DIAGNOSIS — M791 Myalgia, unspecified site: Secondary | ICD-10-CM | POA: Diagnosis not present

## 2023-02-12 DIAGNOSIS — M1030 Gout due to renal impairment, unspecified site: Secondary | ICD-10-CM | POA: Diagnosis not present

## 2023-02-12 DIAGNOSIS — Z794 Long term (current) use of insulin: Secondary | ICD-10-CM | POA: Diagnosis not present

## 2023-02-12 DIAGNOSIS — G4733 Obstructive sleep apnea (adult) (pediatric): Secondary | ICD-10-CM | POA: Diagnosis not present

## 2023-02-12 DIAGNOSIS — M25561 Pain in right knee: Secondary | ICD-10-CM | POA: Diagnosis not present

## 2023-02-12 DIAGNOSIS — M25562 Pain in left knee: Secondary | ICD-10-CM | POA: Diagnosis not present

## 2023-02-13 DIAGNOSIS — N186 End stage renal disease: Secondary | ICD-10-CM | POA: Diagnosis not present

## 2023-02-13 DIAGNOSIS — Z992 Dependence on renal dialysis: Secondary | ICD-10-CM | POA: Diagnosis not present

## 2023-02-13 DIAGNOSIS — N2581 Secondary hyperparathyroidism of renal origin: Secondary | ICD-10-CM | POA: Diagnosis not present

## 2023-02-13 DIAGNOSIS — D689 Coagulation defect, unspecified: Secondary | ICD-10-CM | POA: Diagnosis not present

## 2023-02-13 DIAGNOSIS — R52 Pain, unspecified: Secondary | ICD-10-CM | POA: Diagnosis not present

## 2023-02-16 DIAGNOSIS — R52 Pain, unspecified: Secondary | ICD-10-CM | POA: Diagnosis not present

## 2023-02-16 DIAGNOSIS — N186 End stage renal disease: Secondary | ICD-10-CM | POA: Diagnosis not present

## 2023-02-16 DIAGNOSIS — Z992 Dependence on renal dialysis: Secondary | ICD-10-CM | POA: Diagnosis not present

## 2023-02-16 DIAGNOSIS — N2581 Secondary hyperparathyroidism of renal origin: Secondary | ICD-10-CM | POA: Diagnosis not present

## 2023-02-16 DIAGNOSIS — M791 Myalgia, unspecified site: Secondary | ICD-10-CM | POA: Diagnosis not present

## 2023-02-16 DIAGNOSIS — I5032 Chronic diastolic (congestive) heart failure: Secondary | ICD-10-CM | POA: Diagnosis not present

## 2023-02-16 DIAGNOSIS — I110 Hypertensive heart disease with heart failure: Secondary | ICD-10-CM | POA: Diagnosis not present

## 2023-02-16 DIAGNOSIS — D689 Coagulation defect, unspecified: Secondary | ICD-10-CM | POA: Diagnosis not present

## 2023-02-16 DIAGNOSIS — E1122 Type 2 diabetes mellitus with diabetic chronic kidney disease: Secondary | ICD-10-CM | POA: Diagnosis not present

## 2023-02-18 DIAGNOSIS — R52 Pain, unspecified: Secondary | ICD-10-CM | POA: Diagnosis not present

## 2023-02-18 DIAGNOSIS — Z992 Dependence on renal dialysis: Secondary | ICD-10-CM | POA: Diagnosis not present

## 2023-02-18 DIAGNOSIS — N2581 Secondary hyperparathyroidism of renal origin: Secondary | ICD-10-CM | POA: Diagnosis not present

## 2023-02-18 DIAGNOSIS — N186 End stage renal disease: Secondary | ICD-10-CM | POA: Diagnosis not present

## 2023-02-18 DIAGNOSIS — D689 Coagulation defect, unspecified: Secondary | ICD-10-CM | POA: Diagnosis not present

## 2023-02-19 DIAGNOSIS — M1030 Gout due to renal impairment, unspecified site: Secondary | ICD-10-CM | POA: Diagnosis not present

## 2023-02-19 DIAGNOSIS — Z794 Long term (current) use of insulin: Secondary | ICD-10-CM | POA: Diagnosis not present

## 2023-02-19 DIAGNOSIS — R29898 Other symptoms and signs involving the musculoskeletal system: Secondary | ICD-10-CM | POA: Diagnosis not present

## 2023-02-19 DIAGNOSIS — I110 Hypertensive heart disease with heart failure: Secondary | ICD-10-CM | POA: Diagnosis not present

## 2023-02-19 DIAGNOSIS — Z9181 History of falling: Secondary | ICD-10-CM | POA: Diagnosis not present

## 2023-02-19 DIAGNOSIS — M791 Myalgia, unspecified site: Secondary | ICD-10-CM | POA: Diagnosis not present

## 2023-02-19 DIAGNOSIS — E1122 Type 2 diabetes mellitus with diabetic chronic kidney disease: Secondary | ICD-10-CM | POA: Diagnosis not present

## 2023-02-19 DIAGNOSIS — M25561 Pain in right knee: Secondary | ICD-10-CM | POA: Diagnosis not present

## 2023-02-19 DIAGNOSIS — Z992 Dependence on renal dialysis: Secondary | ICD-10-CM | POA: Diagnosis not present

## 2023-02-19 DIAGNOSIS — Z7984 Long term (current) use of oral hypoglycemic drugs: Secondary | ICD-10-CM | POA: Diagnosis not present

## 2023-02-19 DIAGNOSIS — R262 Difficulty in walking, not elsewhere classified: Secondary | ICD-10-CM | POA: Diagnosis not present

## 2023-02-19 DIAGNOSIS — M25562 Pain in left knee: Secondary | ICD-10-CM | POA: Diagnosis not present

## 2023-02-19 DIAGNOSIS — N186 End stage renal disease: Secondary | ICD-10-CM | POA: Diagnosis not present

## 2023-02-19 DIAGNOSIS — G4733 Obstructive sleep apnea (adult) (pediatric): Secondary | ICD-10-CM | POA: Diagnosis not present

## 2023-02-19 DIAGNOSIS — Z86010 Personal history of colonic polyps: Secondary | ICD-10-CM | POA: Diagnosis not present

## 2023-02-19 DIAGNOSIS — Z791 Long term (current) use of non-steroidal anti-inflammatories (NSAID): Secondary | ICD-10-CM | POA: Diagnosis not present

## 2023-02-19 DIAGNOSIS — N2581 Secondary hyperparathyroidism of renal origin: Secondary | ICD-10-CM | POA: Diagnosis not present

## 2023-02-19 DIAGNOSIS — E7800 Pure hypercholesterolemia, unspecified: Secondary | ICD-10-CM | POA: Diagnosis not present

## 2023-02-19 DIAGNOSIS — I5032 Chronic diastolic (congestive) heart failure: Secondary | ICD-10-CM | POA: Diagnosis not present

## 2023-02-20 DIAGNOSIS — D689 Coagulation defect, unspecified: Secondary | ICD-10-CM | POA: Diagnosis not present

## 2023-02-20 DIAGNOSIS — N2581 Secondary hyperparathyroidism of renal origin: Secondary | ICD-10-CM | POA: Diagnosis not present

## 2023-02-20 DIAGNOSIS — R52 Pain, unspecified: Secondary | ICD-10-CM | POA: Diagnosis not present

## 2023-02-20 DIAGNOSIS — N186 End stage renal disease: Secondary | ICD-10-CM | POA: Diagnosis not present

## 2023-02-20 DIAGNOSIS — Z992 Dependence on renal dialysis: Secondary | ICD-10-CM | POA: Diagnosis not present

## 2023-02-23 DIAGNOSIS — R52 Pain, unspecified: Secondary | ICD-10-CM | POA: Diagnosis not present

## 2023-02-23 DIAGNOSIS — D689 Coagulation defect, unspecified: Secondary | ICD-10-CM | POA: Diagnosis not present

## 2023-02-23 DIAGNOSIS — Z992 Dependence on renal dialysis: Secondary | ICD-10-CM | POA: Diagnosis not present

## 2023-02-23 DIAGNOSIS — N2581 Secondary hyperparathyroidism of renal origin: Secondary | ICD-10-CM | POA: Diagnosis not present

## 2023-02-23 DIAGNOSIS — N186 End stage renal disease: Secondary | ICD-10-CM | POA: Diagnosis not present

## 2023-02-24 ENCOUNTER — Ambulatory Visit: Payer: Medicare Other | Admitting: Podiatry

## 2023-02-25 DIAGNOSIS — N2581 Secondary hyperparathyroidism of renal origin: Secondary | ICD-10-CM | POA: Diagnosis not present

## 2023-02-25 DIAGNOSIS — R52 Pain, unspecified: Secondary | ICD-10-CM | POA: Diagnosis not present

## 2023-02-25 DIAGNOSIS — Z992 Dependence on renal dialysis: Secondary | ICD-10-CM | POA: Diagnosis not present

## 2023-02-25 DIAGNOSIS — D689 Coagulation defect, unspecified: Secondary | ICD-10-CM | POA: Diagnosis not present

## 2023-02-25 DIAGNOSIS — N186 End stage renal disease: Secondary | ICD-10-CM | POA: Diagnosis not present

## 2023-02-27 DIAGNOSIS — R52 Pain, unspecified: Secondary | ICD-10-CM | POA: Diagnosis not present

## 2023-02-27 DIAGNOSIS — Z992 Dependence on renal dialysis: Secondary | ICD-10-CM | POA: Diagnosis not present

## 2023-02-27 DIAGNOSIS — N186 End stage renal disease: Secondary | ICD-10-CM | POA: Diagnosis not present

## 2023-02-27 DIAGNOSIS — N2581 Secondary hyperparathyroidism of renal origin: Secondary | ICD-10-CM | POA: Diagnosis not present

## 2023-02-27 DIAGNOSIS — D689 Coagulation defect, unspecified: Secondary | ICD-10-CM | POA: Diagnosis not present

## 2023-03-02 DIAGNOSIS — I129 Hypertensive chronic kidney disease with stage 1 through stage 4 chronic kidney disease, or unspecified chronic kidney disease: Secondary | ICD-10-CM | POA: Diagnosis not present

## 2023-03-02 DIAGNOSIS — D689 Coagulation defect, unspecified: Secondary | ICD-10-CM | POA: Diagnosis not present

## 2023-03-02 DIAGNOSIS — N2581 Secondary hyperparathyroidism of renal origin: Secondary | ICD-10-CM | POA: Diagnosis not present

## 2023-03-02 DIAGNOSIS — N186 End stage renal disease: Secondary | ICD-10-CM | POA: Diagnosis not present

## 2023-03-02 DIAGNOSIS — Z992 Dependence on renal dialysis: Secondary | ICD-10-CM | POA: Diagnosis not present

## 2023-03-02 DIAGNOSIS — R52 Pain, unspecified: Secondary | ICD-10-CM | POA: Diagnosis not present

## 2023-03-04 DIAGNOSIS — D631 Anemia in chronic kidney disease: Secondary | ICD-10-CM | POA: Diagnosis not present

## 2023-03-04 DIAGNOSIS — D689 Coagulation defect, unspecified: Secondary | ICD-10-CM | POA: Diagnosis not present

## 2023-03-04 DIAGNOSIS — D509 Iron deficiency anemia, unspecified: Secondary | ICD-10-CM | POA: Diagnosis not present

## 2023-03-04 DIAGNOSIS — Z992 Dependence on renal dialysis: Secondary | ICD-10-CM | POA: Diagnosis not present

## 2023-03-04 DIAGNOSIS — N186 End stage renal disease: Secondary | ICD-10-CM | POA: Diagnosis not present

## 2023-03-04 DIAGNOSIS — R52 Pain, unspecified: Secondary | ICD-10-CM | POA: Diagnosis not present

## 2023-03-04 DIAGNOSIS — Z23 Encounter for immunization: Secondary | ICD-10-CM | POA: Diagnosis not present

## 2023-03-04 DIAGNOSIS — E1122 Type 2 diabetes mellitus with diabetic chronic kidney disease: Secondary | ICD-10-CM | POA: Diagnosis not present

## 2023-03-04 DIAGNOSIS — N2581 Secondary hyperparathyroidism of renal origin: Secondary | ICD-10-CM | POA: Diagnosis not present

## 2023-03-05 DIAGNOSIS — Z860100 Personal history of colon polyps, unspecified: Secondary | ICD-10-CM | POA: Diagnosis not present

## 2023-03-05 DIAGNOSIS — N186 End stage renal disease: Secondary | ICD-10-CM | POA: Diagnosis not present

## 2023-03-05 DIAGNOSIS — E7800 Pure hypercholesterolemia, unspecified: Secondary | ICD-10-CM | POA: Diagnosis not present

## 2023-03-05 DIAGNOSIS — M25562 Pain in left knee: Secondary | ICD-10-CM | POA: Diagnosis not present

## 2023-03-05 DIAGNOSIS — R29898 Other symptoms and signs involving the musculoskeletal system: Secondary | ICD-10-CM | POA: Diagnosis not present

## 2023-03-05 DIAGNOSIS — I5032 Chronic diastolic (congestive) heart failure: Secondary | ICD-10-CM | POA: Diagnosis not present

## 2023-03-05 DIAGNOSIS — E1122 Type 2 diabetes mellitus with diabetic chronic kidney disease: Secondary | ICD-10-CM | POA: Diagnosis not present

## 2023-03-05 DIAGNOSIS — Z992 Dependence on renal dialysis: Secondary | ICD-10-CM | POA: Diagnosis not present

## 2023-03-05 DIAGNOSIS — Z7984 Long term (current) use of oral hypoglycemic drugs: Secondary | ICD-10-CM | POA: Diagnosis not present

## 2023-03-05 DIAGNOSIS — M1030 Gout due to renal impairment, unspecified site: Secondary | ICD-10-CM | POA: Diagnosis not present

## 2023-03-05 DIAGNOSIS — Z791 Long term (current) use of non-steroidal anti-inflammatories (NSAID): Secondary | ICD-10-CM | POA: Diagnosis not present

## 2023-03-05 DIAGNOSIS — Z794 Long term (current) use of insulin: Secondary | ICD-10-CM | POA: Diagnosis not present

## 2023-03-05 DIAGNOSIS — R262 Difficulty in walking, not elsewhere classified: Secondary | ICD-10-CM | POA: Diagnosis not present

## 2023-03-05 DIAGNOSIS — G4733 Obstructive sleep apnea (adult) (pediatric): Secondary | ICD-10-CM | POA: Diagnosis not present

## 2023-03-05 DIAGNOSIS — Z9181 History of falling: Secondary | ICD-10-CM | POA: Diagnosis not present

## 2023-03-05 DIAGNOSIS — N2581 Secondary hyperparathyroidism of renal origin: Secondary | ICD-10-CM | POA: Diagnosis not present

## 2023-03-05 DIAGNOSIS — M791 Myalgia, unspecified site: Secondary | ICD-10-CM | POA: Diagnosis not present

## 2023-03-05 DIAGNOSIS — I110 Hypertensive heart disease with heart failure: Secondary | ICD-10-CM | POA: Diagnosis not present

## 2023-03-05 DIAGNOSIS — M25561 Pain in right knee: Secondary | ICD-10-CM | POA: Diagnosis not present

## 2023-03-06 DIAGNOSIS — R52 Pain, unspecified: Secondary | ICD-10-CM | POA: Diagnosis not present

## 2023-03-06 DIAGNOSIS — Z23 Encounter for immunization: Secondary | ICD-10-CM | POA: Diagnosis not present

## 2023-03-06 DIAGNOSIS — D509 Iron deficiency anemia, unspecified: Secondary | ICD-10-CM | POA: Diagnosis not present

## 2023-03-06 DIAGNOSIS — D689 Coagulation defect, unspecified: Secondary | ICD-10-CM | POA: Diagnosis not present

## 2023-03-06 DIAGNOSIS — E1122 Type 2 diabetes mellitus with diabetic chronic kidney disease: Secondary | ICD-10-CM | POA: Diagnosis not present

## 2023-03-06 DIAGNOSIS — D631 Anemia in chronic kidney disease: Secondary | ICD-10-CM | POA: Diagnosis not present

## 2023-03-06 DIAGNOSIS — N186 End stage renal disease: Secondary | ICD-10-CM | POA: Diagnosis not present

## 2023-03-06 DIAGNOSIS — N2581 Secondary hyperparathyroidism of renal origin: Secondary | ICD-10-CM | POA: Diagnosis not present

## 2023-03-06 DIAGNOSIS — Z992 Dependence on renal dialysis: Secondary | ICD-10-CM | POA: Diagnosis not present

## 2023-03-09 DIAGNOSIS — D689 Coagulation defect, unspecified: Secondary | ICD-10-CM | POA: Diagnosis not present

## 2023-03-09 DIAGNOSIS — N2581 Secondary hyperparathyroidism of renal origin: Secondary | ICD-10-CM | POA: Diagnosis not present

## 2023-03-09 DIAGNOSIS — R52 Pain, unspecified: Secondary | ICD-10-CM | POA: Diagnosis not present

## 2023-03-09 DIAGNOSIS — D631 Anemia in chronic kidney disease: Secondary | ICD-10-CM | POA: Diagnosis not present

## 2023-03-09 DIAGNOSIS — N186 End stage renal disease: Secondary | ICD-10-CM | POA: Diagnosis not present

## 2023-03-09 DIAGNOSIS — Z23 Encounter for immunization: Secondary | ICD-10-CM | POA: Diagnosis not present

## 2023-03-09 DIAGNOSIS — Z992 Dependence on renal dialysis: Secondary | ICD-10-CM | POA: Diagnosis not present

## 2023-03-09 DIAGNOSIS — E1122 Type 2 diabetes mellitus with diabetic chronic kidney disease: Secondary | ICD-10-CM | POA: Diagnosis not present

## 2023-03-09 DIAGNOSIS — D509 Iron deficiency anemia, unspecified: Secondary | ICD-10-CM | POA: Diagnosis not present

## 2023-03-11 DIAGNOSIS — D631 Anemia in chronic kidney disease: Secondary | ICD-10-CM | POA: Diagnosis not present

## 2023-03-11 DIAGNOSIS — N2581 Secondary hyperparathyroidism of renal origin: Secondary | ICD-10-CM | POA: Diagnosis not present

## 2023-03-11 DIAGNOSIS — D509 Iron deficiency anemia, unspecified: Secondary | ICD-10-CM | POA: Diagnosis not present

## 2023-03-11 DIAGNOSIS — D689 Coagulation defect, unspecified: Secondary | ICD-10-CM | POA: Diagnosis not present

## 2023-03-11 DIAGNOSIS — E1122 Type 2 diabetes mellitus with diabetic chronic kidney disease: Secondary | ICD-10-CM | POA: Diagnosis not present

## 2023-03-11 DIAGNOSIS — Z992 Dependence on renal dialysis: Secondary | ICD-10-CM | POA: Diagnosis not present

## 2023-03-11 DIAGNOSIS — Z23 Encounter for immunization: Secondary | ICD-10-CM | POA: Diagnosis not present

## 2023-03-11 DIAGNOSIS — R52 Pain, unspecified: Secondary | ICD-10-CM | POA: Diagnosis not present

## 2023-03-11 DIAGNOSIS — N186 End stage renal disease: Secondary | ICD-10-CM | POA: Diagnosis not present

## 2023-03-13 DIAGNOSIS — D689 Coagulation defect, unspecified: Secondary | ICD-10-CM | POA: Diagnosis not present

## 2023-03-13 DIAGNOSIS — D631 Anemia in chronic kidney disease: Secondary | ICD-10-CM | POA: Diagnosis not present

## 2023-03-13 DIAGNOSIS — D509 Iron deficiency anemia, unspecified: Secondary | ICD-10-CM | POA: Diagnosis not present

## 2023-03-13 DIAGNOSIS — Z23 Encounter for immunization: Secondary | ICD-10-CM | POA: Diagnosis not present

## 2023-03-13 DIAGNOSIS — N2581 Secondary hyperparathyroidism of renal origin: Secondary | ICD-10-CM | POA: Diagnosis not present

## 2023-03-13 DIAGNOSIS — Z992 Dependence on renal dialysis: Secondary | ICD-10-CM | POA: Diagnosis not present

## 2023-03-13 DIAGNOSIS — R52 Pain, unspecified: Secondary | ICD-10-CM | POA: Diagnosis not present

## 2023-03-13 DIAGNOSIS — E1122 Type 2 diabetes mellitus with diabetic chronic kidney disease: Secondary | ICD-10-CM | POA: Diagnosis not present

## 2023-03-13 DIAGNOSIS — N186 End stage renal disease: Secondary | ICD-10-CM | POA: Diagnosis not present

## 2023-03-16 DIAGNOSIS — Z23 Encounter for immunization: Secondary | ICD-10-CM | POA: Diagnosis not present

## 2023-03-16 DIAGNOSIS — D631 Anemia in chronic kidney disease: Secondary | ICD-10-CM | POA: Diagnosis not present

## 2023-03-16 DIAGNOSIS — N186 End stage renal disease: Secondary | ICD-10-CM | POA: Diagnosis not present

## 2023-03-16 DIAGNOSIS — D689 Coagulation defect, unspecified: Secondary | ICD-10-CM | POA: Diagnosis not present

## 2023-03-16 DIAGNOSIS — N2581 Secondary hyperparathyroidism of renal origin: Secondary | ICD-10-CM | POA: Diagnosis not present

## 2023-03-16 DIAGNOSIS — E1122 Type 2 diabetes mellitus with diabetic chronic kidney disease: Secondary | ICD-10-CM | POA: Diagnosis not present

## 2023-03-16 DIAGNOSIS — R52 Pain, unspecified: Secondary | ICD-10-CM | POA: Diagnosis not present

## 2023-03-16 DIAGNOSIS — Z992 Dependence on renal dialysis: Secondary | ICD-10-CM | POA: Diagnosis not present

## 2023-03-16 DIAGNOSIS — D509 Iron deficiency anemia, unspecified: Secondary | ICD-10-CM | POA: Diagnosis not present

## 2023-03-18 DIAGNOSIS — Z23 Encounter for immunization: Secondary | ICD-10-CM | POA: Diagnosis not present

## 2023-03-18 DIAGNOSIS — D631 Anemia in chronic kidney disease: Secondary | ICD-10-CM | POA: Diagnosis not present

## 2023-03-18 DIAGNOSIS — D689 Coagulation defect, unspecified: Secondary | ICD-10-CM | POA: Diagnosis not present

## 2023-03-18 DIAGNOSIS — Z992 Dependence on renal dialysis: Secondary | ICD-10-CM | POA: Diagnosis not present

## 2023-03-18 DIAGNOSIS — N186 End stage renal disease: Secondary | ICD-10-CM | POA: Diagnosis not present

## 2023-03-18 DIAGNOSIS — D509 Iron deficiency anemia, unspecified: Secondary | ICD-10-CM | POA: Diagnosis not present

## 2023-03-18 DIAGNOSIS — N2581 Secondary hyperparathyroidism of renal origin: Secondary | ICD-10-CM | POA: Diagnosis not present

## 2023-03-18 DIAGNOSIS — R52 Pain, unspecified: Secondary | ICD-10-CM | POA: Diagnosis not present

## 2023-03-18 DIAGNOSIS — E1122 Type 2 diabetes mellitus with diabetic chronic kidney disease: Secondary | ICD-10-CM | POA: Diagnosis not present

## 2023-03-19 DIAGNOSIS — M1030 Gout due to renal impairment, unspecified site: Secondary | ICD-10-CM | POA: Diagnosis not present

## 2023-03-19 DIAGNOSIS — Z9181 History of falling: Secondary | ICD-10-CM | POA: Diagnosis not present

## 2023-03-19 DIAGNOSIS — N2581 Secondary hyperparathyroidism of renal origin: Secondary | ICD-10-CM | POA: Diagnosis not present

## 2023-03-19 DIAGNOSIS — Z791 Long term (current) use of non-steroidal anti-inflammatories (NSAID): Secondary | ICD-10-CM | POA: Diagnosis not present

## 2023-03-19 DIAGNOSIS — Z992 Dependence on renal dialysis: Secondary | ICD-10-CM | POA: Diagnosis not present

## 2023-03-19 DIAGNOSIS — I110 Hypertensive heart disease with heart failure: Secondary | ICD-10-CM | POA: Diagnosis not present

## 2023-03-19 DIAGNOSIS — E7800 Pure hypercholesterolemia, unspecified: Secondary | ICD-10-CM | POA: Diagnosis not present

## 2023-03-19 DIAGNOSIS — Z794 Long term (current) use of insulin: Secondary | ICD-10-CM | POA: Diagnosis not present

## 2023-03-19 DIAGNOSIS — E1122 Type 2 diabetes mellitus with diabetic chronic kidney disease: Secondary | ICD-10-CM | POA: Diagnosis not present

## 2023-03-19 DIAGNOSIS — Z860100 Personal history of colon polyps, unspecified: Secondary | ICD-10-CM | POA: Diagnosis not present

## 2023-03-19 DIAGNOSIS — M25562 Pain in left knee: Secondary | ICD-10-CM | POA: Diagnosis not present

## 2023-03-19 DIAGNOSIS — R262 Difficulty in walking, not elsewhere classified: Secondary | ICD-10-CM | POA: Diagnosis not present

## 2023-03-19 DIAGNOSIS — M25561 Pain in right knee: Secondary | ICD-10-CM | POA: Diagnosis not present

## 2023-03-19 DIAGNOSIS — Z7984 Long term (current) use of oral hypoglycemic drugs: Secondary | ICD-10-CM | POA: Diagnosis not present

## 2023-03-19 DIAGNOSIS — I5032 Chronic diastolic (congestive) heart failure: Secondary | ICD-10-CM | POA: Diagnosis not present

## 2023-03-19 DIAGNOSIS — N186 End stage renal disease: Secondary | ICD-10-CM | POA: Diagnosis not present

## 2023-03-19 DIAGNOSIS — G4733 Obstructive sleep apnea (adult) (pediatric): Secondary | ICD-10-CM | POA: Diagnosis not present

## 2023-03-19 DIAGNOSIS — M791 Myalgia, unspecified site: Secondary | ICD-10-CM | POA: Diagnosis not present

## 2023-03-19 DIAGNOSIS — R29898 Other symptoms and signs involving the musculoskeletal system: Secondary | ICD-10-CM | POA: Diagnosis not present

## 2023-03-20 DIAGNOSIS — N2581 Secondary hyperparathyroidism of renal origin: Secondary | ICD-10-CM | POA: Diagnosis not present

## 2023-03-20 DIAGNOSIS — R52 Pain, unspecified: Secondary | ICD-10-CM | POA: Diagnosis not present

## 2023-03-20 DIAGNOSIS — D689 Coagulation defect, unspecified: Secondary | ICD-10-CM | POA: Diagnosis not present

## 2023-03-20 DIAGNOSIS — D509 Iron deficiency anemia, unspecified: Secondary | ICD-10-CM | POA: Diagnosis not present

## 2023-03-20 DIAGNOSIS — Z992 Dependence on renal dialysis: Secondary | ICD-10-CM | POA: Diagnosis not present

## 2023-03-20 DIAGNOSIS — Z23 Encounter for immunization: Secondary | ICD-10-CM | POA: Diagnosis not present

## 2023-03-20 DIAGNOSIS — D631 Anemia in chronic kidney disease: Secondary | ICD-10-CM | POA: Diagnosis not present

## 2023-03-20 DIAGNOSIS — E1122 Type 2 diabetes mellitus with diabetic chronic kidney disease: Secondary | ICD-10-CM | POA: Diagnosis not present

## 2023-03-20 DIAGNOSIS — N186 End stage renal disease: Secondary | ICD-10-CM | POA: Diagnosis not present

## 2023-03-23 DIAGNOSIS — N186 End stage renal disease: Secondary | ICD-10-CM | POA: Diagnosis not present

## 2023-03-23 DIAGNOSIS — N2581 Secondary hyperparathyroidism of renal origin: Secondary | ICD-10-CM | POA: Diagnosis not present

## 2023-03-23 DIAGNOSIS — R52 Pain, unspecified: Secondary | ICD-10-CM | POA: Diagnosis not present

## 2023-03-23 DIAGNOSIS — D509 Iron deficiency anemia, unspecified: Secondary | ICD-10-CM | POA: Diagnosis not present

## 2023-03-23 DIAGNOSIS — E1122 Type 2 diabetes mellitus with diabetic chronic kidney disease: Secondary | ICD-10-CM | POA: Diagnosis not present

## 2023-03-23 DIAGNOSIS — D689 Coagulation defect, unspecified: Secondary | ICD-10-CM | POA: Diagnosis not present

## 2023-03-23 DIAGNOSIS — D631 Anemia in chronic kidney disease: Secondary | ICD-10-CM | POA: Diagnosis not present

## 2023-03-23 DIAGNOSIS — Z23 Encounter for immunization: Secondary | ICD-10-CM | POA: Diagnosis not present

## 2023-03-23 DIAGNOSIS — Z992 Dependence on renal dialysis: Secondary | ICD-10-CM | POA: Diagnosis not present

## 2023-03-25 DIAGNOSIS — D509 Iron deficiency anemia, unspecified: Secondary | ICD-10-CM | POA: Diagnosis not present

## 2023-03-25 DIAGNOSIS — N186 End stage renal disease: Secondary | ICD-10-CM | POA: Diagnosis not present

## 2023-03-25 DIAGNOSIS — D631 Anemia in chronic kidney disease: Secondary | ICD-10-CM | POA: Diagnosis not present

## 2023-03-25 DIAGNOSIS — R52 Pain, unspecified: Secondary | ICD-10-CM | POA: Diagnosis not present

## 2023-03-25 DIAGNOSIS — Z23 Encounter for immunization: Secondary | ICD-10-CM | POA: Diagnosis not present

## 2023-03-25 DIAGNOSIS — N2581 Secondary hyperparathyroidism of renal origin: Secondary | ICD-10-CM | POA: Diagnosis not present

## 2023-03-25 DIAGNOSIS — Z992 Dependence on renal dialysis: Secondary | ICD-10-CM | POA: Diagnosis not present

## 2023-03-25 DIAGNOSIS — D689 Coagulation defect, unspecified: Secondary | ICD-10-CM | POA: Diagnosis not present

## 2023-03-25 DIAGNOSIS — E1122 Type 2 diabetes mellitus with diabetic chronic kidney disease: Secondary | ICD-10-CM | POA: Diagnosis not present

## 2023-03-26 ENCOUNTER — Ambulatory Visit: Payer: Medicare Other | Admitting: Podiatry

## 2023-03-26 DIAGNOSIS — Z860100 Personal history of colon polyps, unspecified: Secondary | ICD-10-CM | POA: Diagnosis not present

## 2023-03-26 DIAGNOSIS — G4733 Obstructive sleep apnea (adult) (pediatric): Secondary | ICD-10-CM | POA: Diagnosis not present

## 2023-03-26 DIAGNOSIS — Z992 Dependence on renal dialysis: Secondary | ICD-10-CM | POA: Diagnosis not present

## 2023-03-26 DIAGNOSIS — M1030 Gout due to renal impairment, unspecified site: Secondary | ICD-10-CM | POA: Diagnosis not present

## 2023-03-26 DIAGNOSIS — M25562 Pain in left knee: Secondary | ICD-10-CM | POA: Diagnosis not present

## 2023-03-26 DIAGNOSIS — Z794 Long term (current) use of insulin: Secondary | ICD-10-CM | POA: Diagnosis not present

## 2023-03-26 DIAGNOSIS — Z7984 Long term (current) use of oral hypoglycemic drugs: Secondary | ICD-10-CM | POA: Diagnosis not present

## 2023-03-26 DIAGNOSIS — N2581 Secondary hyperparathyroidism of renal origin: Secondary | ICD-10-CM | POA: Diagnosis not present

## 2023-03-26 DIAGNOSIS — I110 Hypertensive heart disease with heart failure: Secondary | ICD-10-CM | POA: Diagnosis not present

## 2023-03-26 DIAGNOSIS — Z9181 History of falling: Secondary | ICD-10-CM | POA: Diagnosis not present

## 2023-03-26 DIAGNOSIS — Z791 Long term (current) use of non-steroidal anti-inflammatories (NSAID): Secondary | ICD-10-CM | POA: Diagnosis not present

## 2023-03-26 DIAGNOSIS — E1122 Type 2 diabetes mellitus with diabetic chronic kidney disease: Secondary | ICD-10-CM | POA: Diagnosis not present

## 2023-03-26 DIAGNOSIS — E7800 Pure hypercholesterolemia, unspecified: Secondary | ICD-10-CM | POA: Diagnosis not present

## 2023-03-26 DIAGNOSIS — R262 Difficulty in walking, not elsewhere classified: Secondary | ICD-10-CM | POA: Diagnosis not present

## 2023-03-26 DIAGNOSIS — R29898 Other symptoms and signs involving the musculoskeletal system: Secondary | ICD-10-CM | POA: Diagnosis not present

## 2023-03-26 DIAGNOSIS — M25561 Pain in right knee: Secondary | ICD-10-CM | POA: Diagnosis not present

## 2023-03-26 DIAGNOSIS — N186 End stage renal disease: Secondary | ICD-10-CM | POA: Diagnosis not present

## 2023-03-26 DIAGNOSIS — I5032 Chronic diastolic (congestive) heart failure: Secondary | ICD-10-CM | POA: Diagnosis not present

## 2023-03-26 DIAGNOSIS — M791 Myalgia, unspecified site: Secondary | ICD-10-CM | POA: Diagnosis not present

## 2023-03-27 DIAGNOSIS — N2581 Secondary hyperparathyroidism of renal origin: Secondary | ICD-10-CM | POA: Diagnosis not present

## 2023-03-27 DIAGNOSIS — D631 Anemia in chronic kidney disease: Secondary | ICD-10-CM | POA: Diagnosis not present

## 2023-03-27 DIAGNOSIS — Z23 Encounter for immunization: Secondary | ICD-10-CM | POA: Diagnosis not present

## 2023-03-27 DIAGNOSIS — N186 End stage renal disease: Secondary | ICD-10-CM | POA: Diagnosis not present

## 2023-03-27 DIAGNOSIS — E1122 Type 2 diabetes mellitus with diabetic chronic kidney disease: Secondary | ICD-10-CM | POA: Diagnosis not present

## 2023-03-27 DIAGNOSIS — I110 Hypertensive heart disease with heart failure: Secondary | ICD-10-CM | POA: Diagnosis not present

## 2023-03-27 DIAGNOSIS — M791 Myalgia, unspecified site: Secondary | ICD-10-CM | POA: Diagnosis not present

## 2023-03-27 DIAGNOSIS — D509 Iron deficiency anemia, unspecified: Secondary | ICD-10-CM | POA: Diagnosis not present

## 2023-03-27 DIAGNOSIS — I5032 Chronic diastolic (congestive) heart failure: Secondary | ICD-10-CM | POA: Diagnosis not present

## 2023-03-27 DIAGNOSIS — R52 Pain, unspecified: Secondary | ICD-10-CM | POA: Diagnosis not present

## 2023-03-27 DIAGNOSIS — D689 Coagulation defect, unspecified: Secondary | ICD-10-CM | POA: Diagnosis not present

## 2023-03-27 DIAGNOSIS — Z992 Dependence on renal dialysis: Secondary | ICD-10-CM | POA: Diagnosis not present

## 2023-03-30 ENCOUNTER — Emergency Department (HOSPITAL_COMMUNITY): Payer: Medicare Other

## 2023-03-30 ENCOUNTER — Encounter (HOSPITAL_COMMUNITY): Payer: Self-pay

## 2023-03-30 ENCOUNTER — Emergency Department (HOSPITAL_COMMUNITY)
Admission: EM | Admit: 2023-03-30 | Discharge: 2023-03-30 | Disposition: A | Discharge status: Home / Self Care | Payer: Medicare Other | Attending: Student | Admitting: Student

## 2023-03-30 DIAGNOSIS — Z7982 Long term (current) use of aspirin: Secondary | ICD-10-CM | POA: Insufficient documentation

## 2023-03-30 DIAGNOSIS — E1122 Type 2 diabetes mellitus with diabetic chronic kidney disease: Secondary | ICD-10-CM | POA: Diagnosis not present

## 2023-03-30 DIAGNOSIS — M25561 Pain in right knee: Secondary | ICD-10-CM | POA: Diagnosis not present

## 2023-03-30 DIAGNOSIS — M25461 Effusion, right knee: Secondary | ICD-10-CM | POA: Diagnosis not present

## 2023-03-30 DIAGNOSIS — N186 End stage renal disease: Secondary | ICD-10-CM | POA: Diagnosis not present

## 2023-03-30 DIAGNOSIS — Z794 Long term (current) use of insulin: Secondary | ICD-10-CM | POA: Diagnosis not present

## 2023-03-30 DIAGNOSIS — Z992 Dependence on renal dialysis: Secondary | ICD-10-CM | POA: Diagnosis not present

## 2023-03-30 DIAGNOSIS — R450 Nervousness: Secondary | ICD-10-CM | POA: Diagnosis not present

## 2023-03-30 DIAGNOSIS — M79604 Pain in right leg: Secondary | ICD-10-CM | POA: Diagnosis not present

## 2023-03-30 DIAGNOSIS — Z7401 Bed confinement status: Secondary | ICD-10-CM | POA: Diagnosis not present

## 2023-03-30 DIAGNOSIS — I132 Hypertensive heart and chronic kidney disease with heart failure and with stage 5 chronic kidney disease, or end stage renal disease: Secondary | ICD-10-CM | POA: Diagnosis not present

## 2023-03-30 DIAGNOSIS — I5030 Unspecified diastolic (congestive) heart failure: Secondary | ICD-10-CM | POA: Insufficient documentation

## 2023-03-30 DIAGNOSIS — Z79899 Other long term (current) drug therapy: Secondary | ICD-10-CM | POA: Diagnosis not present

## 2023-03-30 DIAGNOSIS — M1711 Unilateral primary osteoarthritis, right knee: Secondary | ICD-10-CM | POA: Diagnosis not present

## 2023-03-30 DIAGNOSIS — R6889 Other general symptoms and signs: Secondary | ICD-10-CM | POA: Diagnosis not present

## 2023-03-30 DIAGNOSIS — M79661 Pain in right lower leg: Secondary | ICD-10-CM | POA: Diagnosis not present

## 2023-03-30 LAB — CBC WITH DIFFERENTIAL/PLATELET
Abs Immature Granulocytes: 0.02 10*3/uL (ref 0.00–0.07)
Basophils Absolute: 0.1 10*3/uL (ref 0.0–0.1)
Basophils Relative: 1 %
Eosinophils Absolute: 0.2 10*3/uL (ref 0.0–0.5)
Eosinophils Relative: 3 %
HCT: 38.6 % (ref 36.0–46.0)
Hemoglobin: 12.1 g/dL (ref 12.0–15.0)
Immature Granulocytes: 0 %
Lymphocytes Relative: 32 %
Lymphs Abs: 2.1 10*3/uL (ref 0.7–4.0)
MCH: 27.6 pg (ref 26.0–34.0)
MCHC: 31.3 g/dL (ref 30.0–36.0)
MCV: 87.9 fL (ref 80.0–100.0)
Monocytes Absolute: 0.5 10*3/uL (ref 0.1–1.0)
Monocytes Relative: 8 %
Neutro Abs: 3.8 10*3/uL (ref 1.7–7.7)
Neutrophils Relative %: 56 %
Platelets: 227 10*3/uL (ref 150–400)
RBC: 4.39 MIL/uL (ref 3.87–5.11)
RDW: 17.2 % — ABNORMAL HIGH (ref 11.5–15.5)
WBC: 6.8 10*3/uL (ref 4.0–10.5)
nRBC: 0 % (ref 0.0–0.2)

## 2023-03-30 LAB — URIC ACID: Uric Acid, Serum: 2.9 mg/dL (ref 2.5–7.1)

## 2023-03-30 LAB — BASIC METABOLIC PANEL
Anion gap: 16 — ABNORMAL HIGH (ref 5–15)
BUN: 37 mg/dL — ABNORMAL HIGH (ref 8–23)
CO2: 28 mmol/L (ref 22–32)
Calcium: 8.4 mg/dL — ABNORMAL LOW (ref 8.9–10.3)
Chloride: 100 mmol/L (ref 98–111)
Creatinine, Ser: 10.23 mg/dL — ABNORMAL HIGH (ref 0.44–1.00)
GFR, Estimated: 4 mL/min — ABNORMAL LOW (ref >60)
Glucose, Bld: 126 mg/dL — ABNORMAL HIGH (ref 70–99)
Potassium: 4 mmol/L (ref 3.5–5.1)
Sodium: 144 mmol/L (ref 135–145)

## 2023-03-30 LAB — CBG MONITORING, ED: Glucose-Capillary: 106 mg/dL — ABNORMAL HIGH (ref 70–99)

## 2023-03-30 MED ORDER — OXYCODONE-ACETAMINOPHEN 5-325 MG PO TABS
1.0000 | ORAL_TABLET | Freq: Once | ORAL | Status: AC
  Administered 2023-03-30: 1 via ORAL
  Filled 2023-03-30: qty 1

## 2023-03-30 MED ORDER — OXYCODONE HCL 5 MG PO TABS: 5.0000 mg | ORAL_TABLET | Freq: Four times a day (QID) | ORAL | 0 refills | Status: DC | PRN

## 2023-03-30 NOTE — ED Provider Notes (Signed)
[facility] AT [facility] Provider Note   CSN: [id number] Arrival date & time: 03/30/23  [id number]     History  Chief Complaint  Patient presents with   Leg Pain    [name] is a 67 y.o. female.  Patient with history of end-stage renal disease on hemodialysis(Monday Wednesday Friday), diabetes, diastolic heart failure, hypertension --presents to the emergency department for evaluation of right lower extremity pain.  She states that while at dialysis on Friday she developed some pain in her right leg.  She has difficulty localizing the pain but it seems to be more in her right knee area.  She was having difficulty "lifting up my leg" due to pain.  The pain then resolved over the past 2 days until this morning when she awoke to try to go to dialysis.  She had increasing pain in her right knee area, again better at rest, worse with movement.  She denies any recent falls or injuries.  No lower back pain.  No calf or thigh pain.  No history of clots.  No numbness or tingling in her toes.  Patient takes Tylenol for pain.  No breathing problems or shortness of breath, severe lower extremity edema.       Home Medications Prior to Admission medications   Medication Sig Start Date End Date Taking? Authorizing Provider  acetaminophen (TYLENOL) 500 MG tablet Take 500 mg by mouth every 6 (six) hours as needed for moderate pain.    [provider]  aspirin 81 MG tablet Take 81 mg by mouth daily.    [provider]  atorvastatin (LIPITOR) 40 MG tablet Take 40 mg by mouth daily.    [provider]  cinacalcet (SENSIPAR) 60 MG tablet Take 60 mg by mouth every other day.    [provider]  cyclobenzaprine (FLEXERIL) 5 MG tablet Take 5 mg by mouth 2 (two) times daily as needed. 07/15/22   [provider]  Doxercalciferol (HECTOROL IV) Doxercalciferol (Hectorol) 07/18/22 07/17/23  [provider]  febuxostat (ULORIC) 40  MG tablet Take 40 mg by mouth daily.    [provider]  FOSRENOL 1000 MG chewable tablet Chew 1,000 mg by mouth 3 (three) times daily with meals. 01/10/16   [provider]  HUMALOG KWIKPEN 100 UNIT/ML KwikPen Inject 16 Units into the skin 2 (two) times daily with a meal. 07/03/20   [provider]  lidocaine-prilocaine (EMLA) cream Apply 1 application topically as needed (local anesthesia).    [provider]  metoprolol tartrate (LOPRESSOR) 25 MG tablet Take 12.5 mg by mouth every other day.    [provider]  multivitamin (RENA-VIT) TABS tablet Take 1 tablet by mouth daily. 11/06/15   Renae Fickle, MD  ONETOUCH VERIO test strip TEST 2 TIMES A DAY AND AS NEEDED FOR BLOOD SUGAR 11/30/15   [provider]  senna-docusate (SENOKOT-S) 8.6-50 MG tablet Take 2 tablets by mouth 2 (two) times daily.    [provider]  TRADJENTA 5 MG TABS tablet Take 5 mg by mouth daily. 11/16/18   [provider]  TRESIBA FLEXTOUCH 100 UNIT/ML SOPN FlexTouch Pen Inject 18 Units into the skin daily as needed (high blood sugar). Dinner 11/17/16   [provider]  trolamine salicylate (ASPERCREME) 10 % cream Apply 1 Application topically as needed for muscle pain.    [provider]      Allergies    Morphine and codeine  Review of Systems   Review of Systems  Physical Exam Updated Vital Signs BP 135/76 (BP Location: Right Arm)   Pulse 74   Temp 97.8 F (36.6 C) (Oral)   Resp 18   SpO2 92%   Physical Exam Vitals and nursing note reviewed.  Constitutional:      Appearance: She is well-developed.  HENT:     Head: Normocephalic and atraumatic.  Eyes:     Conjunctiva/sclera: Conjunctivae normal.  Cardiovascular:     Rate and Rhythm: Normal rate.     Heart sounds: No murmur heard. Pulmonary:     Effort: No respiratory distress.  Abdominal:     Tenderness: There is no abdominal tenderness. There is no guarding or  rebound.  Musculoskeletal:     Cervical back: Normal range of motion and neck supple.     Right hip: No tenderness. Normal range of motion.     Right upper leg: No swelling or tenderness.     Right knee: No swelling or erythema. Decreased range of motion. Tenderness (anterior) present.     Left knee: Normal range of motion.     Right lower leg: No swelling or tenderness.     Right ankle: No tenderness. Normal range of motion.     Right foot: Normal range of motion. No tenderness or bony tenderness. Normal pulse.     Left foot: Normal range of motion. No tenderness or bony tenderness.  Skin:    General: Skin is warm and dry.  Neurological:     Mental Status: She is alert.     ED Results / Procedures / Treatments   Labs (all labs ordered are listed, but only abnormal results are displayed) Labs Reviewed  CBC WITH DIFFERENTIAL/PLATELET - Abnormal; Notable for the following components:      Result Value   RDW 17.2 (*)    All other components within normal limits  BASIC METABOLIC PANEL - Abnormal; Notable for the following components:   Glucose, Bld 126 (*)    BUN 37 (*)    Creatinine, Ser 10.23 (*)    Calcium 8.4 (*)    GFR, Estimated 4 (*)    Anion gap 16 (*)    All other components within normal limits  CBG MONITORING, ED - Abnormal; Notable for the following components:   Glucose-Capillary 106 (*)    All other components within normal limits  URIC ACID  CBG MONITORING, ED    EKG None  Radiology DG Knee Complete 4 Views Right  Result Date: 03/30/2023 CLINICAL DATA:  Knee pain EXAM: RIGHT KNEE - COMPLETE 4+ VIEW COMPARISON:  None available FINDINGS: Moderate-to-severe tricompartmental osteoarthrosis, greatest in the medial compartment. Large ossific fragment located within the anterior aspect of the knee joint measuring 2.5 x 1.7 cm is suspicious for a loose body. Small knee joint effusion is present. IMPRESSION: 1. No fracture or dislocation. 2. Advanced tricompartmental  osteoarthrosis with large intra-articular loose body. Electronically Signed   By: Acquanetta Belling M.D.   On: 03/30/2023 08:10    Procedures Procedures    Medications Ordered in ED Medications - No data to display  ED Course/ Medical Decision Making/ A&P    Patient seen and examined. History obtained directly from patient and family at bedside.   Labs/EKG: Ordered CBC, BMP, uric acid given dialysis and DM status.  Imaging: Ordered x-ray of the right knee.  Medications/Fluids: Ordered: PO percocet for pain.   Most recent vital signs reviewed and are as follows: BP  135/76 (BP Location: Right Arm)   Pulse 74   Temp 97.8 F (36.6 C) (Oral)   Resp 18   SpO2 92%   Initial impression: R knee pain.  No significant effusion, warmth or redness.  Overall low concern for septic arthritis.  Gouty arthritis is a possibility.  She is not tender and does not have swelling in her thigh or calf and overall low concern for DVT.  She has normal distal pulses and sensation, low concern for arterial occlusion.  8:27 AM Reassessment performed. Patient appears stable.  She states that her pain is better but is unwilling to try to get up as movement causes the pain to be worse.  Patient's family states that they will not be able to care for her or get her to dialysis in her current state.  Labs personally reviewed and interpreted including: CBC with normal white blood cell count and hemoglobin; BMP with potassium of 4.0, BUN of 37 with minimally elevated anion gap; UA normal.  Imaging personally visualized and interpreted including: X-ray of the knee with moderate to severe osteoarthritis and loose body noted.  Reviewed pertinent lab work and imaging with patient at bedside. Questions answered.   Most current vital signs reviewed and are as follows: BP 125/71   Pulse 73   Temp 97.8 F (36.6 C) (Oral)   Resp 14   SpO2 97%   Plan: Patient does not have an emergent need for dialysis currently.  She is  not going to be safe to go home.  Family cannot care for her in current state.  She is not able to ambulate after pain medication here.  TOC consult ordered with PT consult.  If patient will have prolonged ED stay time, will discuss with nephrology for routine dialysis.  10:33 AM I had a long discussion with patient and family.  Social work has attempted to see the patient.  They state that they will refuse SNF.  Patient's family member requesting ortho consult. Discussed that her current problem does not require immediate/emergency ortho consultation and there will not likely be a quick fix to regain her previous ambulation status.  I agree that discharge to home would likely be very difficult for the patient and for the patient's caregivers.  After discussion, we agreed to await PT consult.  Social worker was notified.  I did call and discuss x-ray with Margaretha Glassing by phone. Agrees this problem will need routine care, pain control, outpatient follow-up.   11:25 AM Patient discussed with and seen by Dr. Wilkie Aye. Pt/family declining SNF placement, PT consult.    PT consult was performed prior to discharge. PT impression: "Pt's mobility severely limited by rt knee pain. Unable to even get pt to edge of stretcher due to pain and body habitus. Doubt mobility will improve much until rt knee pain is improved. Pt/family declining SNF and doubt if she returns home that she will be able to get to outpatient HD without medical transport."  Will plan for discharge to home by [facility].                                   Medical Decision Making Amount and/or Complexity of Data Reviewed Labs: ordered. Radiology: ordered.  Risk Prescription drug management.   ESRD patient presented with right knee pain today.  Right knee pain is severely injuring ambulation and ability to get to hemodialysis.  She was transported  by EMS today with maximum assistance.  Her x-ray shows a loose body in the right knee without  fracture or dislocation.  Considered gout, however felt less likely given lack of redness, warmth, or effusion.  Uric acid was normal.  In regards to ESRD status, patient looks well and does not have signs of fluid overload.  She is not hypoxic or tachycardic.  No tachypnea.  BUN only minimally elevated and she has normal mental status.  Potassium was normal at 4.0.  She does not require emergent dialysis today.  She is strongly encouraged to follow-up with her dialysis center to determine next session.  In regards to her knee pain.  Family was very interested in having a steroid shot in the knee, however outpatient follow-up was recommended.  No emergent surgical concerns today.  Discussed over the telephone with orthopedic provider who agrees.  Will plan pain control at home with oral medications and routine care.  Ace wrap was ordered.  Given severely decreased ability to ambulate and difficulty of family to care for the patient at home, placement was recommended.  Patient and family are currently averse to placement, however they agree that discharge to home is not ideal either.  After several discussions with patient and family by myself, attending physician, social work, physical therapy, patient and family ultimately opted for discharge to home.  She is high risk of needing to return to the emergency department due to her underlying mobility issues and need for hemodialysis.         Final Clinical Impression(s) / ED Diagnoses Final diagnoses:  Acute pain of right knee    Rx / DC Orders ED Discharge Orders          Ordered    oxyCODONE (OXY IR/ROXICODONE) 5 MG immediate release tablet  Every 6 hours PRN        03/30/23 1302              Renne Crigler, PA-C 03/30/23 1617    Horton, Clabe Seal, DO 03/30/23 1623

## 2023-03-30 NOTE — ED Triage Notes (Signed)
PT BIB [facility] from home with c/o R leg pain x3 days. Pain progressively worsening and now too painful to lift leg. Pt ambulates with rollator at home. Pt was able to stand for EMS with heavy assist. Pt on dialysis MWF. Pt is poor historian.  EMS Vitals: BP 144/78 O2 97% room air HR 80 CBG 139

## 2023-03-30 NOTE — ED Notes (Signed)
[facility] on unit to transport pt

## 2023-03-30 NOTE — Evaluation (Addendum)
Physical Therapy Evaluation Patient Details Name: [name] MRN: [id number] DOB: 1956-03-01 Today's Date: 03/30/2023  History of Present Illness  Pt is 67 year old presented to [facility] on  1028/24 for rt leg pain. X-ray of rt knee showed advanced tricompartmental osteoarthrosis with large intra-articular loose body. PMH - ESRD on HD, DM, chf, HTN  Clinical Impression  Pt's mobility severely limited by rt knee pain. Unable to even get pt to edge of stretcher due to pain and body habitus. Doubt mobility will improve much until rt knee pain is improved. Pt/family declining SNF and doubt if she returns home that she will be able to get to outpatient HD without medical transport. Will continue to work with pt to mobilize as able.         If plan is discharge home, recommend the following: Two people to help with walking and/or transfers;Two people to help with bathing/dressing/bathroom;Assist for transportation;Help with stairs or ramp for entrance   Can travel by private vehicle   No    Equipment Recommendations Hoyer lift;Hospital bed (If goes home)  Recommendations for Other Services       Functional Status Assessment Patient has had a recent decline in their functional status and demonstrates the ability to make significant improvements in function in a reasonable and predictable amount of time.     Precautions / Restrictions Precautions Precautions: Fall Restrictions Weight Bearing Restrictions: No      Mobility  Bed Mobility               General bed mobility comments: Min assist to pull trunk up into semi long sitting position. Unable to attempt sitting edge of stretcher due to rt knee pain    Transfers                        Ambulation/Gait                  Stairs            Wheelchair Mobility     Tilt Bed    Modified Rankin (Stroke Patients Only)       Balance                                              Pertinent Vitals/Pain Pain Assessment Pain Assessment: Faces Faces Pain Scale: Hurts whole lot Pain Location: rt knee with movement of RLE Pain Descriptors / Indicators: Grimacing, Guarding Pain Intervention(s): Limited activity within patient's tolerance, Monitored during session, Repositioned    Home Living Family/patient expects to be discharged to:: Private residence Living Arrangements: Other relatives Available Help at Discharge: Family Type of Home: House Home Access: Stairs to enter Entrance Stairs-Rails: None Entrance Stairs-Number of Steps: 1   Home Layout: One level Home Equipment: Rollator (4 wheels);Wheelchair - manual;Toilet riser      Prior Function Prior Level of Function : Needs assist       Physical Assist : Mobility (physical) Mobility (physical): Bed mobility;Stairs   Mobility Comments: Reports she amb short household distances with rollator on her own. Assist with 1 step into home. Intermittent assist with bed mobility       Extremity/Trunk Assessment   Upper Extremity Assessment Upper Extremity Assessment: Overall WFL for tasks assessed    Lower Extremity Assessment Lower Extremity Assessment: RLE deficits/detail;LLE deficits/detail;Generalized weakness RLE Deficits /  Details: Limited by pain. ROM limited as well by body habitus. LLE Deficits / Details: ROM limited by body habitus    Cervical / Trunk Assessment Cervical / Trunk Assessment: Other exceptions Cervical / Trunk Exceptions: large body habitus  Communication   Communication Communication: No apparent difficulties  Cognition Arousal: Alert Behavior During Therapy: WFL for tasks assessed/performed Overall Cognitive Status: Within Functional Limits for tasks assessed                                          General Comments      Exercises General Exercises - Lower Extremity Ankle Circles/Pumps: AROM, Both, 10 reps, Supine Quad Sets: AROM, Both, 5 reps,  Supine Heel Slides: AAROM, Left, 5 reps, Supine Hip ABduction/ADduction: AAROM, Left, 5 reps, Supine   Assessment/Plan    PT Assessment Patient needs continued PT services  PT Problem List Decreased strength;Decreased range of motion;Decreased activity tolerance;Decreased mobility;Decreased balance;Obesity;Pain       PT Treatment Interventions DME instruction;Gait training;Functional mobility training;Therapeutic activities;Therapeutic exercise;Balance training;Patient/family education    PT Goals (Current goals can be found in the Care Plan section)  Acute Rehab PT Goals Patient Stated Goal: decr knee pain PT Goal Formulation: With patient Time For Goal Achievement: 04/13/23 Potential to Achieve Goals: Fair    Frequency Min 1X/week     Co-evaluation               AM-PAC PT "6 Clicks" Mobility  Outcome Measure Help needed turning from your back to your side while in a flat bed without using bedrails?: Total Help needed moving from lying on your back to sitting on the side of a flat bed without using bedrails?: Total Help needed moving to and from a bed to a chair (including a wheelchair)?: Total Help needed standing up from a chair using your arms (e.g., wheelchair or bedside chair)?: Total Help needed to walk in hospital room?: Total Help needed climbing 3-5 steps with a railing? : Total 6 Click Score: 6    End of Session   Activity Tolerance: Patient limited by pain Patient left: in bed;with call bell/phone within reach;with family/visitor present Nurse Communication: Mobility status PT Visit Diagnosis: Other abnormalities of gait and mobility (R26.89);Muscle weakness (generalized) (M62.81);Pain Pain - Right/Left: Right Pain - part of body: Knee    Time: 1610-9604 PT Time Calculation (min) (ACUTE ONLY): 18 min   Charges:   PT Evaluation $PT Eval Moderate Complexity: 1 Mod   PT General Charges $$ ACUTE PT VISIT: 1 Visit         [facility] PT Acute  Rehabilitation Services Office [phone]   Angelina Ok [facility] 03/30/2023, 12:19 PM

## 2023-03-30 NOTE — Discharge Planning (Signed)
RNCM consulted regarding safe discharge planning (Home with Home Health vs Skilled Nursing Facility Placement).  Physical Therapy evaluation placed; will follow up after recommendations from PT.     

## 2023-03-30 NOTE — Progress Notes (Signed)
CSW spoke with patient and her sister at bedside. Patient and sister are declining SNF at this time. Patient stated she has home health with [facility]. Patients sister believes that patient needs to be admitted into the hospital.

## 2023-03-30 NOTE — ED Provider Notes (Signed)
I personally evaluated and had a conversation with the patient and her family member at bedside.  Our concern being that due to the leg pain she is essentially immobile and not safe for discharge home.  We recommended physical therapy evaluation and placement in SNF.  However they have declined.  The family member states that she wants to take her home.  However she is requesting that the knee gets injected and that she has dialysis here before leaving.  I explained that orthopedics has instructed for leg wrapping and outpatient follow-up for evaluation of possible injection.  This is not something that will happen from an emergent standpoint here in the ER.  In regards to the dialysis her blood work looks baseline with no respiratory symptoms.  We have no indication for emergent dialysis here in the ER.  I again recommend and offer SNF placement but they politely declined.  We have instructed them that she can resume her dialysis on Wednesday however the family member has concerned that she will not be able to physically get her there.  Again I reiterated our recommendation for SNF and they declined.  So at this time we will attempt discharge for the patient home.   Rozelle Logan, DO 03/30/23 1557

## 2023-03-30 NOTE — ED Notes (Signed)
PTAR has been scheduled for the patient. ETA: within the hour.

## 2023-03-30 NOTE — Discharge Instructions (Addendum)
Please read and follow all provided instructions.  Your diagnoses today include:  1. Acute pain of right knee    Tests performed today include: Complete blood cell count Basic metabolic panel: Shows signs consistent with dialysis, your electrolytes today were normal and do not warrant emergent dialysis Vital signs. See below for your results today.   Medications prescribed:  Oxycodone - narcotic pain medication  DO NOT drive or perform any activities that require you to be awake and alert because this medicine can make you drowsy.   Take any prescribed medications only as directed.  Home care instructions:  Follow any educational materials contained in this packet.  Please make sure you are in communication with your dialysis center to determine if they want you to come in tomorrow for a session given that you missed today.  BE VERY CAREFUL not to take multiple medicines containing Tylenol (also called acetaminophen). Doing so can lead to an overdose which can damage your liver and cause liver failure and possibly death.   Follow-up instructions: Please follow-up with your primary care provider in the next 3 days for further evaluation of your symptoms.   Return instructions:  Please return to the Emergency Department if you experience worsening symptoms.  Please return if you have any other emergent concerns.  Additional Information:  Your vital signs today were: BP 123/88   Pulse 87   Temp 97.9 F (36.6 C) (Oral)   Resp 15   SpO2 97%  If your blood pressure (BP) was elevated above 135/85 this visit, please have this repeated by your doctor within one month. --------------

## 2023-03-31 DIAGNOSIS — E1122 Type 2 diabetes mellitus with diabetic chronic kidney disease: Secondary | ICD-10-CM | POA: Diagnosis not present

## 2023-03-31 DIAGNOSIS — D631 Anemia in chronic kidney disease: Secondary | ICD-10-CM | POA: Diagnosis not present

## 2023-03-31 DIAGNOSIS — D509 Iron deficiency anemia, unspecified: Secondary | ICD-10-CM | POA: Diagnosis not present

## 2023-03-31 DIAGNOSIS — R52 Pain, unspecified: Secondary | ICD-10-CM | POA: Diagnosis not present

## 2023-03-31 DIAGNOSIS — Z23 Encounter for immunization: Secondary | ICD-10-CM | POA: Diagnosis not present

## 2023-03-31 DIAGNOSIS — D689 Coagulation defect, unspecified: Secondary | ICD-10-CM | POA: Diagnosis not present

## 2023-03-31 DIAGNOSIS — Z992 Dependence on renal dialysis: Secondary | ICD-10-CM | POA: Diagnosis not present

## 2023-03-31 DIAGNOSIS — N186 End stage renal disease: Secondary | ICD-10-CM | POA: Diagnosis not present

## 2023-03-31 DIAGNOSIS — N2581 Secondary hyperparathyroidism of renal origin: Secondary | ICD-10-CM | POA: Diagnosis not present

## 2023-04-01 DIAGNOSIS — D689 Coagulation defect, unspecified: Secondary | ICD-10-CM | POA: Diagnosis not present

## 2023-04-01 DIAGNOSIS — D509 Iron deficiency anemia, unspecified: Secondary | ICD-10-CM | POA: Diagnosis not present

## 2023-04-01 DIAGNOSIS — Z992 Dependence on renal dialysis: Secondary | ICD-10-CM | POA: Diagnosis not present

## 2023-04-01 DIAGNOSIS — R52 Pain, unspecified: Secondary | ICD-10-CM | POA: Diagnosis not present

## 2023-04-01 DIAGNOSIS — N186 End stage renal disease: Secondary | ICD-10-CM | POA: Diagnosis not present

## 2023-04-01 DIAGNOSIS — Z23 Encounter for immunization: Secondary | ICD-10-CM | POA: Diagnosis not present

## 2023-04-01 DIAGNOSIS — N2581 Secondary hyperparathyroidism of renal origin: Secondary | ICD-10-CM | POA: Diagnosis not present

## 2023-04-01 DIAGNOSIS — D631 Anemia in chronic kidney disease: Secondary | ICD-10-CM | POA: Diagnosis not present

## 2023-04-01 DIAGNOSIS — E1122 Type 2 diabetes mellitus with diabetic chronic kidney disease: Secondary | ICD-10-CM | POA: Diagnosis not present

## 2023-04-02 DIAGNOSIS — Z992 Dependence on renal dialysis: Secondary | ICD-10-CM | POA: Diagnosis not present

## 2023-04-02 DIAGNOSIS — I129 Hypertensive chronic kidney disease with stage 1 through stage 4 chronic kidney disease, or unspecified chronic kidney disease: Secondary | ICD-10-CM | POA: Diagnosis not present

## 2023-04-02 DIAGNOSIS — N186 End stage renal disease: Secondary | ICD-10-CM | POA: Diagnosis not present

## 2023-04-03 DIAGNOSIS — R52 Pain, unspecified: Secondary | ICD-10-CM | POA: Diagnosis not present

## 2023-04-03 DIAGNOSIS — Z992 Dependence on renal dialysis: Secondary | ICD-10-CM | POA: Diagnosis not present

## 2023-04-03 DIAGNOSIS — N186 End stage renal disease: Secondary | ICD-10-CM | POA: Diagnosis not present

## 2023-04-03 DIAGNOSIS — D689 Coagulation defect, unspecified: Secondary | ICD-10-CM | POA: Diagnosis not present

## 2023-04-03 DIAGNOSIS — T7840XA Allergy, unspecified, initial encounter: Secondary | ICD-10-CM | POA: Diagnosis not present

## 2023-04-03 DIAGNOSIS — N2581 Secondary hyperparathyroidism of renal origin: Secondary | ICD-10-CM | POA: Diagnosis not present

## 2023-04-06 DIAGNOSIS — N2581 Secondary hyperparathyroidism of renal origin: Secondary | ICD-10-CM | POA: Diagnosis not present

## 2023-04-06 DIAGNOSIS — Z992 Dependence on renal dialysis: Secondary | ICD-10-CM | POA: Diagnosis not present

## 2023-04-06 DIAGNOSIS — R52 Pain, unspecified: Secondary | ICD-10-CM | POA: Diagnosis not present

## 2023-04-06 DIAGNOSIS — D689 Coagulation defect, unspecified: Secondary | ICD-10-CM | POA: Diagnosis not present

## 2023-04-06 DIAGNOSIS — N186 End stage renal disease: Secondary | ICD-10-CM | POA: Diagnosis not present

## 2023-04-06 DIAGNOSIS — T7840XA Allergy, unspecified, initial encounter: Secondary | ICD-10-CM | POA: Diagnosis not present

## 2023-04-07 DIAGNOSIS — R5381 Other malaise: Secondary | ICD-10-CM | POA: Diagnosis not present

## 2023-04-07 DIAGNOSIS — M25561 Pain in right knee: Secondary | ICD-10-CM | POA: Diagnosis not present

## 2023-04-07 DIAGNOSIS — Z09 Encounter for follow-up examination after completed treatment for conditions other than malignant neoplasm: Secondary | ICD-10-CM | POA: Diagnosis not present

## 2023-04-08 DIAGNOSIS — N2581 Secondary hyperparathyroidism of renal origin: Secondary | ICD-10-CM | POA: Diagnosis not present

## 2023-04-08 DIAGNOSIS — Z992 Dependence on renal dialysis: Secondary | ICD-10-CM | POA: Diagnosis not present

## 2023-04-08 DIAGNOSIS — R52 Pain, unspecified: Secondary | ICD-10-CM | POA: Diagnosis not present

## 2023-04-08 DIAGNOSIS — T7840XA Allergy, unspecified, initial encounter: Secondary | ICD-10-CM | POA: Diagnosis not present

## 2023-04-08 DIAGNOSIS — D689 Coagulation defect, unspecified: Secondary | ICD-10-CM | POA: Diagnosis not present

## 2023-04-08 DIAGNOSIS — N186 End stage renal disease: Secondary | ICD-10-CM | POA: Diagnosis not present

## 2023-04-10 DIAGNOSIS — T7840XA Allergy, unspecified, initial encounter: Secondary | ICD-10-CM | POA: Diagnosis not present

## 2023-04-10 DIAGNOSIS — D689 Coagulation defect, unspecified: Secondary | ICD-10-CM | POA: Diagnosis not present

## 2023-04-10 DIAGNOSIS — N186 End stage renal disease: Secondary | ICD-10-CM | POA: Diagnosis not present

## 2023-04-10 DIAGNOSIS — N2581 Secondary hyperparathyroidism of renal origin: Secondary | ICD-10-CM | POA: Diagnosis not present

## 2023-04-10 DIAGNOSIS — Z992 Dependence on renal dialysis: Secondary | ICD-10-CM | POA: Diagnosis not present

## 2023-04-10 DIAGNOSIS — R52 Pain, unspecified: Secondary | ICD-10-CM | POA: Diagnosis not present

## 2023-04-13 DIAGNOSIS — N186 End stage renal disease: Secondary | ICD-10-CM | POA: Diagnosis not present

## 2023-04-13 DIAGNOSIS — Z992 Dependence on renal dialysis: Secondary | ICD-10-CM | POA: Diagnosis not present

## 2023-04-13 DIAGNOSIS — N2581 Secondary hyperparathyroidism of renal origin: Secondary | ICD-10-CM | POA: Diagnosis not present

## 2023-04-13 DIAGNOSIS — D689 Coagulation defect, unspecified: Secondary | ICD-10-CM | POA: Diagnosis not present

## 2023-04-13 DIAGNOSIS — T7840XA Allergy, unspecified, initial encounter: Secondary | ICD-10-CM | POA: Diagnosis not present

## 2023-04-13 DIAGNOSIS — R52 Pain, unspecified: Secondary | ICD-10-CM | POA: Diagnosis not present

## 2023-04-14 DIAGNOSIS — M791 Myalgia, unspecified site: Secondary | ICD-10-CM | POA: Diagnosis not present

## 2023-04-14 DIAGNOSIS — Z791 Long term (current) use of non-steroidal anti-inflammatories (NSAID): Secondary | ICD-10-CM | POA: Diagnosis not present

## 2023-04-14 DIAGNOSIS — M1030 Gout due to renal impairment, unspecified site: Secondary | ICD-10-CM | POA: Diagnosis not present

## 2023-04-14 DIAGNOSIS — R262 Difficulty in walking, not elsewhere classified: Secondary | ICD-10-CM | POA: Diagnosis not present

## 2023-04-14 DIAGNOSIS — I110 Hypertensive heart disease with heart failure: Secondary | ICD-10-CM | POA: Diagnosis not present

## 2023-04-14 DIAGNOSIS — E1122 Type 2 diabetes mellitus with diabetic chronic kidney disease: Secondary | ICD-10-CM | POA: Diagnosis not present

## 2023-04-14 DIAGNOSIS — Z7984 Long term (current) use of oral hypoglycemic drugs: Secondary | ICD-10-CM | POA: Diagnosis not present

## 2023-04-14 DIAGNOSIS — M25562 Pain in left knee: Secondary | ICD-10-CM | POA: Diagnosis not present

## 2023-04-14 DIAGNOSIS — N2581 Secondary hyperparathyroidism of renal origin: Secondary | ICD-10-CM | POA: Diagnosis not present

## 2023-04-14 DIAGNOSIS — Z860100 Personal history of colon polyps, unspecified: Secondary | ICD-10-CM | POA: Diagnosis not present

## 2023-04-14 DIAGNOSIS — R29898 Other symptoms and signs involving the musculoskeletal system: Secondary | ICD-10-CM | POA: Diagnosis not present

## 2023-04-14 DIAGNOSIS — Z992 Dependence on renal dialysis: Secondary | ICD-10-CM | POA: Diagnosis not present

## 2023-04-14 DIAGNOSIS — M25561 Pain in right knee: Secondary | ICD-10-CM | POA: Diagnosis not present

## 2023-04-14 DIAGNOSIS — N186 End stage renal disease: Secondary | ICD-10-CM | POA: Diagnosis not present

## 2023-04-14 DIAGNOSIS — Z794 Long term (current) use of insulin: Secondary | ICD-10-CM | POA: Diagnosis not present

## 2023-04-14 DIAGNOSIS — E7800 Pure hypercholesterolemia, unspecified: Secondary | ICD-10-CM | POA: Diagnosis not present

## 2023-04-14 DIAGNOSIS — Z9181 History of falling: Secondary | ICD-10-CM | POA: Diagnosis not present

## 2023-04-14 DIAGNOSIS — I5032 Chronic diastolic (congestive) heart failure: Secondary | ICD-10-CM | POA: Diagnosis not present

## 2023-04-14 DIAGNOSIS — G4733 Obstructive sleep apnea (adult) (pediatric): Secondary | ICD-10-CM | POA: Diagnosis not present

## 2023-04-15 DIAGNOSIS — N2581 Secondary hyperparathyroidism of renal origin: Secondary | ICD-10-CM | POA: Diagnosis not present

## 2023-04-15 DIAGNOSIS — Z992 Dependence on renal dialysis: Secondary | ICD-10-CM | POA: Diagnosis not present

## 2023-04-15 DIAGNOSIS — T7840XA Allergy, unspecified, initial encounter: Secondary | ICD-10-CM | POA: Diagnosis not present

## 2023-04-15 DIAGNOSIS — R52 Pain, unspecified: Secondary | ICD-10-CM | POA: Diagnosis not present

## 2023-04-15 DIAGNOSIS — N186 End stage renal disease: Secondary | ICD-10-CM | POA: Diagnosis not present

## 2023-04-15 DIAGNOSIS — D689 Coagulation defect, unspecified: Secondary | ICD-10-CM | POA: Diagnosis not present

## 2023-04-16 DIAGNOSIS — E1122 Type 2 diabetes mellitus with diabetic chronic kidney disease: Secondary | ICD-10-CM | POA: Diagnosis not present

## 2023-04-16 DIAGNOSIS — M791 Myalgia, unspecified site: Secondary | ICD-10-CM | POA: Diagnosis not present

## 2023-04-16 DIAGNOSIS — I10 Essential (primary) hypertension: Secondary | ICD-10-CM | POA: Diagnosis not present

## 2023-04-16 DIAGNOSIS — E7800 Pure hypercholesterolemia, unspecified: Secondary | ICD-10-CM | POA: Diagnosis not present

## 2023-04-17 DIAGNOSIS — Z992 Dependence on renal dialysis: Secondary | ICD-10-CM | POA: Diagnosis not present

## 2023-04-17 DIAGNOSIS — N186 End stage renal disease: Secondary | ICD-10-CM | POA: Diagnosis not present

## 2023-04-17 DIAGNOSIS — N2581 Secondary hyperparathyroidism of renal origin: Secondary | ICD-10-CM | POA: Diagnosis not present

## 2023-04-17 DIAGNOSIS — T7840XA Allergy, unspecified, initial encounter: Secondary | ICD-10-CM | POA: Diagnosis not present

## 2023-04-17 DIAGNOSIS — D689 Coagulation defect, unspecified: Secondary | ICD-10-CM | POA: Diagnosis not present

## 2023-04-17 DIAGNOSIS — R52 Pain, unspecified: Secondary | ICD-10-CM | POA: Diagnosis not present

## 2023-04-20 DIAGNOSIS — N186 End stage renal disease: Secondary | ICD-10-CM | POA: Diagnosis not present

## 2023-04-20 DIAGNOSIS — R52 Pain, unspecified: Secondary | ICD-10-CM | POA: Diagnosis not present

## 2023-04-20 DIAGNOSIS — N2581 Secondary hyperparathyroidism of renal origin: Secondary | ICD-10-CM | POA: Diagnosis not present

## 2023-04-20 DIAGNOSIS — D689 Coagulation defect, unspecified: Secondary | ICD-10-CM | POA: Diagnosis not present

## 2023-04-20 DIAGNOSIS — T7840XA Allergy, unspecified, initial encounter: Secondary | ICD-10-CM | POA: Diagnosis not present

## 2023-04-20 DIAGNOSIS — Z992 Dependence on renal dialysis: Secondary | ICD-10-CM | POA: Diagnosis not present

## 2023-04-22 DIAGNOSIS — N186 End stage renal disease: Secondary | ICD-10-CM | POA: Diagnosis not present

## 2023-04-22 DIAGNOSIS — R52 Pain, unspecified: Secondary | ICD-10-CM | POA: Diagnosis not present

## 2023-04-22 DIAGNOSIS — D689 Coagulation defect, unspecified: Secondary | ICD-10-CM | POA: Diagnosis not present

## 2023-04-22 DIAGNOSIS — Z992 Dependence on renal dialysis: Secondary | ICD-10-CM | POA: Diagnosis not present

## 2023-04-22 DIAGNOSIS — T7840XA Allergy, unspecified, initial encounter: Secondary | ICD-10-CM | POA: Diagnosis not present

## 2023-04-22 DIAGNOSIS — N2581 Secondary hyperparathyroidism of renal origin: Secondary | ICD-10-CM | POA: Diagnosis not present

## 2023-04-24 DIAGNOSIS — N2581 Secondary hyperparathyroidism of renal origin: Secondary | ICD-10-CM | POA: Diagnosis not present

## 2023-04-24 DIAGNOSIS — Z992 Dependence on renal dialysis: Secondary | ICD-10-CM | POA: Diagnosis not present

## 2023-04-24 DIAGNOSIS — D689 Coagulation defect, unspecified: Secondary | ICD-10-CM | POA: Diagnosis not present

## 2023-04-24 DIAGNOSIS — T7840XA Allergy, unspecified, initial encounter: Secondary | ICD-10-CM | POA: Diagnosis not present

## 2023-04-24 DIAGNOSIS — N186 End stage renal disease: Secondary | ICD-10-CM | POA: Diagnosis not present

## 2023-04-24 DIAGNOSIS — R52 Pain, unspecified: Secondary | ICD-10-CM | POA: Diagnosis not present

## 2023-04-27 DIAGNOSIS — D689 Coagulation defect, unspecified: Secondary | ICD-10-CM | POA: Diagnosis not present

## 2023-04-27 DIAGNOSIS — R52 Pain, unspecified: Secondary | ICD-10-CM | POA: Diagnosis not present

## 2023-04-27 DIAGNOSIS — Z992 Dependence on renal dialysis: Secondary | ICD-10-CM | POA: Diagnosis not present

## 2023-04-27 DIAGNOSIS — T7840XA Allergy, unspecified, initial encounter: Secondary | ICD-10-CM | POA: Diagnosis not present

## 2023-04-27 DIAGNOSIS — N186 End stage renal disease: Secondary | ICD-10-CM | POA: Diagnosis not present

## 2023-04-27 DIAGNOSIS — N2581 Secondary hyperparathyroidism of renal origin: Secondary | ICD-10-CM | POA: Diagnosis not present

## 2023-04-28 DIAGNOSIS — M25561 Pain in right knee: Secondary | ICD-10-CM | POA: Diagnosis not present

## 2023-04-28 DIAGNOSIS — M25562 Pain in left knee: Secondary | ICD-10-CM | POA: Diagnosis not present

## 2023-04-29 DIAGNOSIS — T7840XA Allergy, unspecified, initial encounter: Secondary | ICD-10-CM | POA: Diagnosis not present

## 2023-04-29 DIAGNOSIS — D689 Coagulation defect, unspecified: Secondary | ICD-10-CM | POA: Diagnosis not present

## 2023-04-29 DIAGNOSIS — Z992 Dependence on renal dialysis: Secondary | ICD-10-CM | POA: Diagnosis not present

## 2023-04-29 DIAGNOSIS — N2581 Secondary hyperparathyroidism of renal origin: Secondary | ICD-10-CM | POA: Diagnosis not present

## 2023-04-29 DIAGNOSIS — N186 End stage renal disease: Secondary | ICD-10-CM | POA: Diagnosis not present

## 2023-04-29 DIAGNOSIS — R52 Pain, unspecified: Secondary | ICD-10-CM | POA: Diagnosis not present

## 2023-05-01 DIAGNOSIS — D689 Coagulation defect, unspecified: Secondary | ICD-10-CM | POA: Diagnosis not present

## 2023-05-01 DIAGNOSIS — Z992 Dependence on renal dialysis: Secondary | ICD-10-CM | POA: Diagnosis not present

## 2023-05-01 DIAGNOSIS — R52 Pain, unspecified: Secondary | ICD-10-CM | POA: Diagnosis not present

## 2023-05-01 DIAGNOSIS — N186 End stage renal disease: Secondary | ICD-10-CM | POA: Diagnosis not present

## 2023-05-01 DIAGNOSIS — N2581 Secondary hyperparathyroidism of renal origin: Secondary | ICD-10-CM | POA: Diagnosis not present

## 2023-05-01 DIAGNOSIS — T7840XA Allergy, unspecified, initial encounter: Secondary | ICD-10-CM | POA: Diagnosis not present

## 2023-05-02 DIAGNOSIS — N186 End stage renal disease: Secondary | ICD-10-CM | POA: Diagnosis not present

## 2023-05-02 DIAGNOSIS — Z992 Dependence on renal dialysis: Secondary | ICD-10-CM | POA: Diagnosis not present

## 2023-05-02 DIAGNOSIS — I129 Hypertensive chronic kidney disease with stage 1 through stage 4 chronic kidney disease, or unspecified chronic kidney disease: Secondary | ICD-10-CM | POA: Diagnosis not present

## 2023-05-04 DIAGNOSIS — Z992 Dependence on renal dialysis: Secondary | ICD-10-CM | POA: Diagnosis not present

## 2023-05-04 DIAGNOSIS — N186 End stage renal disease: Secondary | ICD-10-CM | POA: Diagnosis not present

## 2023-05-04 DIAGNOSIS — N2581 Secondary hyperparathyroidism of renal origin: Secondary | ICD-10-CM | POA: Diagnosis not present

## 2023-05-04 DIAGNOSIS — D689 Coagulation defect, unspecified: Secondary | ICD-10-CM | POA: Diagnosis not present

## 2023-05-05 ENCOUNTER — Ambulatory Visit: Payer: Medicare Other | Admitting: Podiatry

## 2023-05-06 DIAGNOSIS — Z992 Dependence on renal dialysis: Secondary | ICD-10-CM | POA: Diagnosis not present

## 2023-05-06 DIAGNOSIS — D689 Coagulation defect, unspecified: Secondary | ICD-10-CM | POA: Diagnosis not present

## 2023-05-06 DIAGNOSIS — N2581 Secondary hyperparathyroidism of renal origin: Secondary | ICD-10-CM | POA: Diagnosis not present

## 2023-05-06 DIAGNOSIS — N186 End stage renal disease: Secondary | ICD-10-CM | POA: Diagnosis not present

## 2023-05-08 DIAGNOSIS — Z992 Dependence on renal dialysis: Secondary | ICD-10-CM | POA: Diagnosis not present

## 2023-05-08 DIAGNOSIS — D689 Coagulation defect, unspecified: Secondary | ICD-10-CM | POA: Diagnosis not present

## 2023-05-08 DIAGNOSIS — N2581 Secondary hyperparathyroidism of renal origin: Secondary | ICD-10-CM | POA: Diagnosis not present

## 2023-05-08 DIAGNOSIS — N186 End stage renal disease: Secondary | ICD-10-CM | POA: Diagnosis not present

## 2023-05-11 DIAGNOSIS — D689 Coagulation defect, unspecified: Secondary | ICD-10-CM | POA: Diagnosis not present

## 2023-05-11 DIAGNOSIS — N186 End stage renal disease: Secondary | ICD-10-CM | POA: Diagnosis not present

## 2023-05-11 DIAGNOSIS — Z992 Dependence on renal dialysis: Secondary | ICD-10-CM | POA: Diagnosis not present

## 2023-05-11 DIAGNOSIS — N2581 Secondary hyperparathyroidism of renal origin: Secondary | ICD-10-CM | POA: Diagnosis not present

## 2023-05-13 DIAGNOSIS — Z992 Dependence on renal dialysis: Secondary | ICD-10-CM | POA: Diagnosis not present

## 2023-05-13 DIAGNOSIS — N186 End stage renal disease: Secondary | ICD-10-CM | POA: Diagnosis not present

## 2023-05-13 DIAGNOSIS — N2581 Secondary hyperparathyroidism of renal origin: Secondary | ICD-10-CM | POA: Diagnosis not present

## 2023-05-13 DIAGNOSIS — D689 Coagulation defect, unspecified: Secondary | ICD-10-CM | POA: Diagnosis not present

## 2023-05-15 DIAGNOSIS — D689 Coagulation defect, unspecified: Secondary | ICD-10-CM | POA: Diagnosis not present

## 2023-05-15 DIAGNOSIS — N186 End stage renal disease: Secondary | ICD-10-CM | POA: Diagnosis not present

## 2023-05-15 DIAGNOSIS — Z992 Dependence on renal dialysis: Secondary | ICD-10-CM | POA: Diagnosis not present

## 2023-05-15 DIAGNOSIS — N2581 Secondary hyperparathyroidism of renal origin: Secondary | ICD-10-CM | POA: Diagnosis not present

## 2023-05-18 DIAGNOSIS — N186 End stage renal disease: Secondary | ICD-10-CM | POA: Diagnosis not present

## 2023-05-18 DIAGNOSIS — Z992 Dependence on renal dialysis: Secondary | ICD-10-CM | POA: Diagnosis not present

## 2023-05-18 DIAGNOSIS — N2581 Secondary hyperparathyroidism of renal origin: Secondary | ICD-10-CM | POA: Diagnosis not present

## 2023-05-18 DIAGNOSIS — D689 Coagulation defect, unspecified: Secondary | ICD-10-CM | POA: Diagnosis not present

## 2023-05-19 DIAGNOSIS — Z9181 History of falling: Secondary | ICD-10-CM | POA: Diagnosis not present

## 2023-05-19 DIAGNOSIS — E1122 Type 2 diabetes mellitus with diabetic chronic kidney disease: Secondary | ICD-10-CM | POA: Diagnosis not present

## 2023-05-19 DIAGNOSIS — N2581 Secondary hyperparathyroidism of renal origin: Secondary | ICD-10-CM | POA: Diagnosis not present

## 2023-05-19 DIAGNOSIS — Z794 Long term (current) use of insulin: Secondary | ICD-10-CM | POA: Diagnosis not present

## 2023-05-19 DIAGNOSIS — M791 Myalgia, unspecified site: Secondary | ICD-10-CM | POA: Diagnosis not present

## 2023-05-19 DIAGNOSIS — R29898 Other symptoms and signs involving the musculoskeletal system: Secondary | ICD-10-CM | POA: Diagnosis not present

## 2023-05-19 DIAGNOSIS — I110 Hypertensive heart disease with heart failure: Secondary | ICD-10-CM | POA: Diagnosis not present

## 2023-05-19 DIAGNOSIS — M25562 Pain in left knee: Secondary | ICD-10-CM | POA: Diagnosis not present

## 2023-05-19 DIAGNOSIS — Z791 Long term (current) use of non-steroidal anti-inflammatories (NSAID): Secondary | ICD-10-CM | POA: Diagnosis not present

## 2023-05-19 DIAGNOSIS — R262 Difficulty in walking, not elsewhere classified: Secondary | ICD-10-CM | POA: Diagnosis not present

## 2023-05-19 DIAGNOSIS — G4733 Obstructive sleep apnea (adult) (pediatric): Secondary | ICD-10-CM | POA: Diagnosis not present

## 2023-05-19 DIAGNOSIS — Z860100 Personal history of colon polyps, unspecified: Secondary | ICD-10-CM | POA: Diagnosis not present

## 2023-05-19 DIAGNOSIS — Z7984 Long term (current) use of oral hypoglycemic drugs: Secondary | ICD-10-CM | POA: Diagnosis not present

## 2023-05-19 DIAGNOSIS — M1030 Gout due to renal impairment, unspecified site: Secondary | ICD-10-CM | POA: Diagnosis not present

## 2023-05-19 DIAGNOSIS — Z992 Dependence on renal dialysis: Secondary | ICD-10-CM | POA: Diagnosis not present

## 2023-05-19 DIAGNOSIS — E7800 Pure hypercholesterolemia, unspecified: Secondary | ICD-10-CM | POA: Diagnosis not present

## 2023-05-19 DIAGNOSIS — M25561 Pain in right knee: Secondary | ICD-10-CM | POA: Diagnosis not present

## 2023-05-19 DIAGNOSIS — N186 End stage renal disease: Secondary | ICD-10-CM | POA: Diagnosis not present

## 2023-05-19 DIAGNOSIS — I5032 Chronic diastolic (congestive) heart failure: Secondary | ICD-10-CM | POA: Diagnosis not present

## 2023-05-20 DIAGNOSIS — N2581 Secondary hyperparathyroidism of renal origin: Secondary | ICD-10-CM | POA: Diagnosis not present

## 2023-05-20 DIAGNOSIS — N186 End stage renal disease: Secondary | ICD-10-CM | POA: Diagnosis not present

## 2023-05-20 DIAGNOSIS — D689 Coagulation defect, unspecified: Secondary | ICD-10-CM | POA: Diagnosis not present

## 2023-05-20 DIAGNOSIS — Z992 Dependence on renal dialysis: Secondary | ICD-10-CM | POA: Diagnosis not present

## 2023-05-22 DIAGNOSIS — Z992 Dependence on renal dialysis: Secondary | ICD-10-CM | POA: Diagnosis not present

## 2023-05-22 DIAGNOSIS — N186 End stage renal disease: Secondary | ICD-10-CM | POA: Diagnosis not present

## 2023-05-22 DIAGNOSIS — N2581 Secondary hyperparathyroidism of renal origin: Secondary | ICD-10-CM | POA: Diagnosis not present

## 2023-05-22 DIAGNOSIS — D689 Coagulation defect, unspecified: Secondary | ICD-10-CM | POA: Diagnosis not present

## 2023-05-24 DIAGNOSIS — Z992 Dependence on renal dialysis: Secondary | ICD-10-CM | POA: Diagnosis not present

## 2023-05-24 DIAGNOSIS — D689 Coagulation defect, unspecified: Secondary | ICD-10-CM | POA: Diagnosis not present

## 2023-05-24 DIAGNOSIS — N2581 Secondary hyperparathyroidism of renal origin: Secondary | ICD-10-CM | POA: Diagnosis not present

## 2023-05-24 DIAGNOSIS — N186 End stage renal disease: Secondary | ICD-10-CM | POA: Diagnosis not present

## 2023-05-26 DIAGNOSIS — Z992 Dependence on renal dialysis: Secondary | ICD-10-CM | POA: Diagnosis not present

## 2023-05-26 DIAGNOSIS — N186 End stage renal disease: Secondary | ICD-10-CM | POA: Diagnosis not present

## 2023-05-26 DIAGNOSIS — D689 Coagulation defect, unspecified: Secondary | ICD-10-CM | POA: Diagnosis not present

## 2023-05-26 DIAGNOSIS — N2581 Secondary hyperparathyroidism of renal origin: Secondary | ICD-10-CM | POA: Diagnosis not present

## 2023-05-29 DIAGNOSIS — N2581 Secondary hyperparathyroidism of renal origin: Secondary | ICD-10-CM | POA: Diagnosis not present

## 2023-05-29 DIAGNOSIS — D689 Coagulation defect, unspecified: Secondary | ICD-10-CM | POA: Diagnosis not present

## 2023-05-29 DIAGNOSIS — N186 End stage renal disease: Secondary | ICD-10-CM | POA: Diagnosis not present

## 2023-05-29 DIAGNOSIS — Z992 Dependence on renal dialysis: Secondary | ICD-10-CM | POA: Diagnosis not present

## 2023-05-31 DIAGNOSIS — N2581 Secondary hyperparathyroidism of renal origin: Secondary | ICD-10-CM | POA: Diagnosis not present

## 2023-05-31 DIAGNOSIS — D689 Coagulation defect, unspecified: Secondary | ICD-10-CM | POA: Diagnosis not present

## 2023-05-31 DIAGNOSIS — N186 End stage renal disease: Secondary | ICD-10-CM | POA: Diagnosis not present

## 2023-05-31 DIAGNOSIS — Z992 Dependence on renal dialysis: Secondary | ICD-10-CM | POA: Diagnosis not present

## 2023-06-02 DIAGNOSIS — N2581 Secondary hyperparathyroidism of renal origin: Secondary | ICD-10-CM | POA: Diagnosis not present

## 2023-06-02 DIAGNOSIS — Z992 Dependence on renal dialysis: Secondary | ICD-10-CM | POA: Diagnosis not present

## 2023-06-02 DIAGNOSIS — I129 Hypertensive chronic kidney disease with stage 1 through stage 4 chronic kidney disease, or unspecified chronic kidney disease: Secondary | ICD-10-CM | POA: Diagnosis not present

## 2023-06-02 DIAGNOSIS — D689 Coagulation defect, unspecified: Secondary | ICD-10-CM | POA: Diagnosis not present

## 2023-06-02 DIAGNOSIS — N186 End stage renal disease: Secondary | ICD-10-CM | POA: Diagnosis not present

## 2023-06-03 DIAGNOSIS — I132 Hypertensive heart and chronic kidney disease with heart failure and with stage 5 chronic kidney disease, or end stage renal disease: Secondary | ICD-10-CM | POA: Diagnosis not present

## 2023-06-03 DIAGNOSIS — M791 Myalgia, unspecified site: Secondary | ICD-10-CM | POA: Diagnosis not present

## 2023-06-03 DIAGNOSIS — Z794 Long term (current) use of insulin: Secondary | ICD-10-CM | POA: Diagnosis not present

## 2023-06-03 DIAGNOSIS — Z7982 Long term (current) use of aspirin: Secondary | ICD-10-CM | POA: Diagnosis not present

## 2023-06-03 DIAGNOSIS — Z992 Dependence on renal dialysis: Secondary | ICD-10-CM | POA: Diagnosis not present

## 2023-06-03 DIAGNOSIS — Z9181 History of falling: Secondary | ICD-10-CM | POA: Diagnosis not present

## 2023-06-03 DIAGNOSIS — Z7984 Long term (current) use of oral hypoglycemic drugs: Secondary | ICD-10-CM | POA: Diagnosis not present

## 2023-06-03 DIAGNOSIS — M25561 Pain in right knee: Secondary | ICD-10-CM | POA: Diagnosis not present

## 2023-06-03 DIAGNOSIS — N186 End stage renal disease: Secondary | ICD-10-CM | POA: Diagnosis not present

## 2023-06-03 DIAGNOSIS — N2581 Secondary hyperparathyroidism of renal origin: Secondary | ICD-10-CM | POA: Diagnosis not present

## 2023-06-03 DIAGNOSIS — M1030 Gout due to renal impairment, unspecified site: Secondary | ICD-10-CM | POA: Diagnosis not present

## 2023-06-03 DIAGNOSIS — G4733 Obstructive sleep apnea (adult) (pediatric): Secondary | ICD-10-CM | POA: Diagnosis not present

## 2023-06-03 DIAGNOSIS — I5032 Chronic diastolic (congestive) heart failure: Secondary | ICD-10-CM | POA: Diagnosis not present

## 2023-06-03 DIAGNOSIS — R29898 Other symptoms and signs involving the musculoskeletal system: Secondary | ICD-10-CM | POA: Diagnosis not present

## 2023-06-03 DIAGNOSIS — E7800 Pure hypercholesterolemia, unspecified: Secondary | ICD-10-CM | POA: Diagnosis not present

## 2023-06-03 DIAGNOSIS — E1122 Type 2 diabetes mellitus with diabetic chronic kidney disease: Secondary | ICD-10-CM | POA: Diagnosis not present

## 2023-06-03 DIAGNOSIS — R262 Difficulty in walking, not elsewhere classified: Secondary | ICD-10-CM | POA: Diagnosis not present

## 2023-06-03 DIAGNOSIS — M25562 Pain in left knee: Secondary | ICD-10-CM | POA: Diagnosis not present

## 2023-06-05 DIAGNOSIS — D631 Anemia in chronic kidney disease: Secondary | ICD-10-CM | POA: Diagnosis not present

## 2023-06-05 DIAGNOSIS — N186 End stage renal disease: Secondary | ICD-10-CM | POA: Diagnosis not present

## 2023-06-05 DIAGNOSIS — N2581 Secondary hyperparathyroidism of renal origin: Secondary | ICD-10-CM | POA: Diagnosis not present

## 2023-06-05 DIAGNOSIS — D689 Coagulation defect, unspecified: Secondary | ICD-10-CM | POA: Diagnosis not present

## 2023-06-05 DIAGNOSIS — R52 Pain, unspecified: Secondary | ICD-10-CM | POA: Diagnosis not present

## 2023-06-05 DIAGNOSIS — E1122 Type 2 diabetes mellitus with diabetic chronic kidney disease: Secondary | ICD-10-CM | POA: Diagnosis not present

## 2023-06-05 DIAGNOSIS — D509 Iron deficiency anemia, unspecified: Secondary | ICD-10-CM | POA: Diagnosis not present

## 2023-06-05 DIAGNOSIS — Z992 Dependence on renal dialysis: Secondary | ICD-10-CM | POA: Diagnosis not present

## 2023-06-08 DIAGNOSIS — N186 End stage renal disease: Secondary | ICD-10-CM | POA: Diagnosis not present

## 2023-06-08 DIAGNOSIS — D509 Iron deficiency anemia, unspecified: Secondary | ICD-10-CM | POA: Diagnosis not present

## 2023-06-08 DIAGNOSIS — D631 Anemia in chronic kidney disease: Secondary | ICD-10-CM | POA: Diagnosis not present

## 2023-06-08 DIAGNOSIS — R52 Pain, unspecified: Secondary | ICD-10-CM | POA: Diagnosis not present

## 2023-06-08 DIAGNOSIS — E1122 Type 2 diabetes mellitus with diabetic chronic kidney disease: Secondary | ICD-10-CM | POA: Diagnosis not present

## 2023-06-08 DIAGNOSIS — Z992 Dependence on renal dialysis: Secondary | ICD-10-CM | POA: Diagnosis not present

## 2023-06-08 DIAGNOSIS — D689 Coagulation defect, unspecified: Secondary | ICD-10-CM | POA: Diagnosis not present

## 2023-06-08 DIAGNOSIS — N2581 Secondary hyperparathyroidism of renal origin: Secondary | ICD-10-CM | POA: Diagnosis not present

## 2023-06-09 DIAGNOSIS — G4733 Obstructive sleep apnea (adult) (pediatric): Secondary | ICD-10-CM | POA: Diagnosis not present

## 2023-06-09 DIAGNOSIS — Z7984 Long term (current) use of oral hypoglycemic drugs: Secondary | ICD-10-CM | POA: Diagnosis not present

## 2023-06-09 DIAGNOSIS — Z7982 Long term (current) use of aspirin: Secondary | ICD-10-CM | POA: Diagnosis not present

## 2023-06-09 DIAGNOSIS — N2581 Secondary hyperparathyroidism of renal origin: Secondary | ICD-10-CM | POA: Diagnosis not present

## 2023-06-09 DIAGNOSIS — M1030 Gout due to renal impairment, unspecified site: Secondary | ICD-10-CM | POA: Diagnosis not present

## 2023-06-09 DIAGNOSIS — R262 Difficulty in walking, not elsewhere classified: Secondary | ICD-10-CM | POA: Diagnosis not present

## 2023-06-09 DIAGNOSIS — I132 Hypertensive heart and chronic kidney disease with heart failure and with stage 5 chronic kidney disease, or end stage renal disease: Secondary | ICD-10-CM | POA: Diagnosis not present

## 2023-06-09 DIAGNOSIS — M25562 Pain in left knee: Secondary | ICD-10-CM | POA: Diagnosis not present

## 2023-06-09 DIAGNOSIS — M25561 Pain in right knee: Secondary | ICD-10-CM | POA: Diagnosis not present

## 2023-06-09 DIAGNOSIS — E7800 Pure hypercholesterolemia, unspecified: Secondary | ICD-10-CM | POA: Diagnosis not present

## 2023-06-09 DIAGNOSIS — R29898 Other symptoms and signs involving the musculoskeletal system: Secondary | ICD-10-CM | POA: Diagnosis not present

## 2023-06-09 DIAGNOSIS — I5032 Chronic diastolic (congestive) heart failure: Secondary | ICD-10-CM | POA: Diagnosis not present

## 2023-06-09 DIAGNOSIS — E1122 Type 2 diabetes mellitus with diabetic chronic kidney disease: Secondary | ICD-10-CM | POA: Diagnosis not present

## 2023-06-09 DIAGNOSIS — Z794 Long term (current) use of insulin: Secondary | ICD-10-CM | POA: Diagnosis not present

## 2023-06-09 DIAGNOSIS — M791 Myalgia, unspecified site: Secondary | ICD-10-CM | POA: Diagnosis not present

## 2023-06-09 DIAGNOSIS — N186 End stage renal disease: Secondary | ICD-10-CM | POA: Diagnosis not present

## 2023-06-09 DIAGNOSIS — Z992 Dependence on renal dialysis: Secondary | ICD-10-CM | POA: Diagnosis not present

## 2023-06-09 DIAGNOSIS — Z9181 History of falling: Secondary | ICD-10-CM | POA: Diagnosis not present

## 2023-06-10 DIAGNOSIS — D509 Iron deficiency anemia, unspecified: Secondary | ICD-10-CM | POA: Diagnosis not present

## 2023-06-10 DIAGNOSIS — N2581 Secondary hyperparathyroidism of renal origin: Secondary | ICD-10-CM | POA: Diagnosis not present

## 2023-06-10 DIAGNOSIS — N186 End stage renal disease: Secondary | ICD-10-CM | POA: Diagnosis not present

## 2023-06-10 DIAGNOSIS — D631 Anemia in chronic kidney disease: Secondary | ICD-10-CM | POA: Diagnosis not present

## 2023-06-10 DIAGNOSIS — Z992 Dependence on renal dialysis: Secondary | ICD-10-CM | POA: Diagnosis not present

## 2023-06-10 DIAGNOSIS — R52 Pain, unspecified: Secondary | ICD-10-CM | POA: Diagnosis not present

## 2023-06-10 DIAGNOSIS — D689 Coagulation defect, unspecified: Secondary | ICD-10-CM | POA: Diagnosis not present

## 2023-06-10 DIAGNOSIS — E1122 Type 2 diabetes mellitus with diabetic chronic kidney disease: Secondary | ICD-10-CM | POA: Diagnosis not present

## 2023-06-12 DIAGNOSIS — N186 End stage renal disease: Secondary | ICD-10-CM | POA: Diagnosis not present

## 2023-06-12 DIAGNOSIS — E1122 Type 2 diabetes mellitus with diabetic chronic kidney disease: Secondary | ICD-10-CM | POA: Diagnosis not present

## 2023-06-12 DIAGNOSIS — D509 Iron deficiency anemia, unspecified: Secondary | ICD-10-CM | POA: Diagnosis not present

## 2023-06-12 DIAGNOSIS — D689 Coagulation defect, unspecified: Secondary | ICD-10-CM | POA: Diagnosis not present

## 2023-06-12 DIAGNOSIS — D631 Anemia in chronic kidney disease: Secondary | ICD-10-CM | POA: Diagnosis not present

## 2023-06-12 DIAGNOSIS — Z992 Dependence on renal dialysis: Secondary | ICD-10-CM | POA: Diagnosis not present

## 2023-06-12 DIAGNOSIS — R52 Pain, unspecified: Secondary | ICD-10-CM | POA: Diagnosis not present

## 2023-06-12 DIAGNOSIS — N2581 Secondary hyperparathyroidism of renal origin: Secondary | ICD-10-CM | POA: Diagnosis not present

## 2023-06-15 DIAGNOSIS — N186 End stage renal disease: Secondary | ICD-10-CM | POA: Diagnosis not present

## 2023-06-15 DIAGNOSIS — Z992 Dependence on renal dialysis: Secondary | ICD-10-CM | POA: Diagnosis not present

## 2023-06-15 DIAGNOSIS — D509 Iron deficiency anemia, unspecified: Secondary | ICD-10-CM | POA: Diagnosis not present

## 2023-06-15 DIAGNOSIS — D631 Anemia in chronic kidney disease: Secondary | ICD-10-CM | POA: Diagnosis not present

## 2023-06-15 DIAGNOSIS — E1122 Type 2 diabetes mellitus with diabetic chronic kidney disease: Secondary | ICD-10-CM | POA: Diagnosis not present

## 2023-06-15 DIAGNOSIS — D689 Coagulation defect, unspecified: Secondary | ICD-10-CM | POA: Diagnosis not present

## 2023-06-15 DIAGNOSIS — R52 Pain, unspecified: Secondary | ICD-10-CM | POA: Diagnosis not present

## 2023-06-15 DIAGNOSIS — N2581 Secondary hyperparathyroidism of renal origin: Secondary | ICD-10-CM | POA: Diagnosis not present

## 2023-06-17 DIAGNOSIS — I5032 Chronic diastolic (congestive) heart failure: Secondary | ICD-10-CM | POA: Diagnosis not present

## 2023-06-17 DIAGNOSIS — D631 Anemia in chronic kidney disease: Secondary | ICD-10-CM | POA: Diagnosis not present

## 2023-06-17 DIAGNOSIS — D689 Coagulation defect, unspecified: Secondary | ICD-10-CM | POA: Diagnosis not present

## 2023-06-17 DIAGNOSIS — M791 Myalgia, unspecified site: Secondary | ICD-10-CM | POA: Diagnosis not present

## 2023-06-17 DIAGNOSIS — E1122 Type 2 diabetes mellitus with diabetic chronic kidney disease: Secondary | ICD-10-CM | POA: Diagnosis not present

## 2023-06-17 DIAGNOSIS — R52 Pain, unspecified: Secondary | ICD-10-CM | POA: Diagnosis not present

## 2023-06-17 DIAGNOSIS — D509 Iron deficiency anemia, unspecified: Secondary | ICD-10-CM | POA: Diagnosis not present

## 2023-06-17 DIAGNOSIS — I132 Hypertensive heart and chronic kidney disease with heart failure and with stage 5 chronic kidney disease, or end stage renal disease: Secondary | ICD-10-CM | POA: Diagnosis not present

## 2023-06-17 DIAGNOSIS — Z992 Dependence on renal dialysis: Secondary | ICD-10-CM | POA: Diagnosis not present

## 2023-06-17 DIAGNOSIS — N2581 Secondary hyperparathyroidism of renal origin: Secondary | ICD-10-CM | POA: Diagnosis not present

## 2023-06-17 DIAGNOSIS — N186 End stage renal disease: Secondary | ICD-10-CM | POA: Diagnosis not present

## 2023-06-18 ENCOUNTER — Encounter (HOSPITAL_COMMUNITY)
Admission: RE | Disposition: A | Discharge status: Home / Self Care | Payer: Self-pay | Source: Ambulatory Visit | Attending: Nephrology

## 2023-06-18 ENCOUNTER — Encounter (HOSPITAL_COMMUNITY): Payer: Self-pay | Admitting: Nephrology

## 2023-06-18 ENCOUNTER — Other Ambulatory Visit: Payer: Self-pay

## 2023-06-18 ENCOUNTER — Ambulatory Visit (HOSPITAL_COMMUNITY)
Admission: RE | Admit: 2023-06-18 | Discharge: 2023-06-18 | Disposition: A | Discharge status: Home / Self Care | Payer: Medicare Other | Source: Ambulatory Visit | Attending: Nephrology | Admitting: Nephrology

## 2023-06-18 DIAGNOSIS — I5032 Chronic diastolic (congestive) heart failure: Secondary | ICD-10-CM | POA: Diagnosis not present

## 2023-06-18 DIAGNOSIS — I132 Hypertensive heart and chronic kidney disease with heart failure and with stage 5 chronic kidney disease, or end stage renal disease: Secondary | ICD-10-CM | POA: Diagnosis not present

## 2023-06-18 DIAGNOSIS — T82858A Stenosis of vascular prosthetic devices, implants and grafts, initial encounter: Secondary | ICD-10-CM | POA: Insufficient documentation

## 2023-06-18 DIAGNOSIS — E1122 Type 2 diabetes mellitus with diabetic chronic kidney disease: Secondary | ICD-10-CM | POA: Insufficient documentation

## 2023-06-18 DIAGNOSIS — Z8249 Family history of ischemic heart disease and other diseases of the circulatory system: Secondary | ICD-10-CM | POA: Diagnosis not present

## 2023-06-18 DIAGNOSIS — N186 End stage renal disease: Secondary | ICD-10-CM | POA: Insufficient documentation

## 2023-06-18 DIAGNOSIS — Z7984 Long term (current) use of oral hypoglycemic drugs: Secondary | ICD-10-CM | POA: Diagnosis not present

## 2023-06-18 DIAGNOSIS — T82510A Breakdown (mechanical) of surgically created arteriovenous fistula, initial encounter: Secondary | ICD-10-CM | POA: Insufficient documentation

## 2023-06-18 DIAGNOSIS — Y832 Surgical operation with anastomosis, bypass or graft as the cause of abnormal reaction of the patient, or of later complication, without mention of misadventure at the time of the procedure: Secondary | ICD-10-CM | POA: Insufficient documentation

## 2023-06-18 DIAGNOSIS — Z992 Dependence on renal dialysis: Secondary | ICD-10-CM | POA: Diagnosis not present

## 2023-06-18 DIAGNOSIS — Z794 Long term (current) use of insulin: Secondary | ICD-10-CM | POA: Diagnosis not present

## 2023-06-18 HISTORY — PX: PERIPHERAL VASCULAR BALLOON ANGIOPLASTY: CATH118281

## 2023-06-18 HISTORY — PX: A/V FISTULAGRAM: CATH118298

## 2023-06-18 SURGERY — A/V FISTULAGRAM
Anesthesia: LOCAL

## 2023-06-18 MED ORDER — ONDANSETRON HCL 4 MG/2ML IJ SOLN: 4.0000 mg | Freq: Four times a day (QID) | INTRAMUSCULAR | Status: DC | PRN

## 2023-06-18 MED ORDER — HEPARIN (PORCINE) IN NACL 1000-0.9 UT/500ML-% IV SOLN
INTRAVENOUS | Status: DC | PRN
  Administered 2023-06-18: 500 mL

## 2023-06-18 MED ORDER — MIDAZOLAM HCL 2 MG/2ML IJ SOLN
INTRAMUSCULAR | Status: DC | PRN
  Administered 2023-06-18: 1 mg via INTRAVENOUS

## 2023-06-18 MED ORDER — FENTANYL CITRATE (PF) 100 MCG/2ML IJ SOLN
INTRAMUSCULAR | Status: DC | PRN
  Administered 2023-06-18: 25 ug via INTRAVENOUS

## 2023-06-18 MED ORDER — IODIXANOL 320 MG/ML IV SOLN
INTRAVENOUS | Status: DC | PRN
  Administered 2023-06-18: 20 mL

## 2023-06-18 MED ORDER — FENTANYL CITRATE (PF) 100 MCG/2ML IJ SOLN
INTRAMUSCULAR | Status: AC
  Filled 2023-06-18: qty 2

## 2023-06-18 MED ORDER — ACETAMINOPHEN 325 MG PO TABS: 650.0000 mg | ORAL_TABLET | ORAL | Status: DC | PRN

## 2023-06-18 MED ORDER — LIDOCAINE HCL (PF) 1 % IJ SOLN
INTRAMUSCULAR | Status: AC
  Filled 2023-06-18: qty 30

## 2023-06-18 MED ORDER — MIDAZOLAM HCL 2 MG/2ML IJ SOLN
INTRAMUSCULAR | Status: AC
  Filled 2023-06-18: qty 2

## 2023-06-18 MED ORDER — SODIUM CHLORIDE 0.9% FLUSH: 3.0000 mL | INTRAVENOUS | Status: DC | PRN

## 2023-06-18 MED ORDER — SODIUM CHLORIDE 0.9 % IV SOLN: INTRAVENOUS | Status: DC

## 2023-06-18 MED ORDER — LIDOCAINE HCL (PF) 1 % IJ SOLN
INTRAMUSCULAR | Status: DC | PRN
  Administered 2023-06-18: 2 mL via INTRADERMAL

## 2023-06-18 SURGICAL SUPPLY — 9 items
BALLN MUSTANG 10.0X40 75 (BALLOONS) ×2
BALLOON MUSTANG 10.0X40 75 (BALLOONS) IMPLANT
COVER DOME SNAP 22 D (MISCELLANEOUS) ×2 IMPLANT
GUIDEWIRE ANGLED .035X150CM (WIRE) IMPLANT
KIT MICROPUNCTURE NIT STIFF (SHEATH) IMPLANT
MAT PREVALON FULL STRYKER (MISCELLANEOUS) IMPLANT
SHEATH PINNACLE R/O II 7F 4CM (SHEATH) IMPLANT
SYR MEDALLION 10ML (SYRINGE) IMPLANT
TRAY PV CATH (CUSTOM PROCEDURE TRAY) ×2 IMPLANT

## 2023-06-18 NOTE — Discharge Instructions (Signed)

## 2023-06-18 NOTE — H&P (Signed)
[facility]  INPATIENT CONSULTATION  Reason for Consultation: Low access flow HD access Requesting Provider: Dr. Signe Colt  HPI: [name] is an 68 y.o. female with ESRD on HD, HL, HTN, h/o remote seizures, HFpEF who presents for evaluation of low AF at dialysis.   She has a L BC AVF placed and superficialized in 2017.  Last intervention arch 10mm in 2021.  AF have been somewhat variable but last was much lower.  Yesterday experienced PCT had difficulty with venous needle cannulation and high VP per her report.    She is not on anticoagulation, is NPO, has a ride and no h/o contrast allergy.   PMH: Past Medical History:  Diagnosis Date   Arthritis    Chronic diastolic CHF (congestive heart failure) (HCC)    a. Echo 5/17: severe LVH, EF 55-60%, mild AS, mean AV gradient 10 mmHg, mod LAE, PASP 37 mmHg   Diabetes mellitus without complication (HCC)    type 2   ESRD (end stage renal disease) (HCC)    Tues, Thurs, Sat dialysis   H/O blood clots    many years ago in leg   History of pneumonia    HLD (hyperlipidemia)    Hypertension    BP low after dialysis started, no further BP meds   Seizures (HCC)    had one or two (more than 10-15 years ago)   Sleep apnea    uses c-pap most of time   PSH: Past Surgical History:  Procedure Laterality Date   ABDOMINAL HYSTERECTOMY     AV FISTULA PLACEMENT Left 07/25/2015   Procedure: ARTERIOVENOUS (AV) FISTULA CREATION -LEFT BRACHIOCEPHALIC;  Surgeon: Fransisco Hertz, MD;  Location: [facility] OR;  Service: Vascular;  Laterality: Left;   COLONOSCOPY     COLONOSCOPY WITH PROPOFOL N/A 11/18/2017   Procedure: COLONOSCOPY WITH PROPOFOL;  Surgeon: Willis Modena, MD;  Location: [facility] ENDOSCOPY;  Service: Endoscopy;  Laterality: N/A;   COLONOSCOPY WITH PROPOFOL Bilateral 07/23/2022   Procedure: COLONOSCOPY WITH PROPOFOL;  Surgeon: Willis Modena, MD;  Location: [facility] ENDOSCOPY;  Service: Gastroenterology;  Laterality: Bilateral;   FISTULA  SUPERFICIALIZATION Left 11/01/2015   Procedure: SUPERFICIALIZATION OF LEFT ARM BRACHIOCEPHALIC FISTULA ;  Surgeon: Fransisco Hertz, MD;  Location[facility] OR;  Service: Vascular;  Laterality: Left;   HEMOSTASIS CLIP PLACEMENT  07/23/2022   Procedure: HEMOSTASIS CLIP PLACEMENT;  Surgeon: Willis Modena, MD;  Location: [facility] ENDOSCOPY;  Service: Gastroenterology;;   INSERTION OF DIALYSIS CATHETER Right 10/09/2015   Procedure: INSERTION OF DIALYSIS CATHETER RIGHT INTERNAL JUGULAR ;  Surgeon: Chuck Hint, MD;  Location: [facility] OR;  Service: Vascular;  Laterality: Right;   POLYPECTOMY  11/18/2017   Procedure: POLYPECTOMY;  Surgeon: Willis Modena, MD;  Location: [facility] ENDOSCOPY;  Service: Endoscopy;;   POLYPECTOMY  07/23/2022   Procedure: POLYPECTOMY;  Surgeon: Willis Modena, MD;  Location: [facility] ENDOSCOPY;  Service: Gastroenterology;;   Past Medical History:  Diagnosis Date   Arthritis    Chronic diastolic CHF (congestive heart failure) (HCC)    a. Echo 5/17: severe LVH, EF 55-60%, mild AS, mean AV gradient 10 mmHg, mod LAE, PASP 37 mmHg   Diabetes mellitus without complication (HCC)    type 2   ESRD (end stage renal disease) (HCC)    Tues, Thurs, Sat dialysis   H/O blood clots    many years ago in leg   History of pneumonia    HLD (hyperlipidemia)    Hypertension    BP low after dialysis started,  no further BP meds   Seizures (HCC)    had one or two (more than 10-15 years ago)   Sleep apnea    uses c-pap most of time    Medications:  I have reviewed the patient's current medications.  Medications Prior to Admission  Medication Sig Dispense Refill   acetaminophen (TYLENOL) 500 MG tablet Take 500 mg by mouth every 6 (six) hours as needed for moderate pain.     aspirin 81 MG tablet Take 81 mg by mouth daily.     atorvastatin (LIPITOR) 40 MG tablet Take 40 mg by mouth daily.     cinacalcet (SENSIPAR) 60 MG tablet Take 60 mg by mouth every other day.     diclofenac Sodium (VOLTAREN) 1 % GEL Apply  1 Application topically 3 (three) times daily as needed (pain).     febuxostat (ULORIC) 40 MG tablet Take 40 mg by mouth daily.     HUMALOG KWIKPEN 100 UNIT/ML KwikPen Inject 17 Units into the skin 2 (two) times daily with a meal.     lidocaine-prilocaine (EMLA) cream Apply 1 application topically as needed (local anesthesia).     metoprolol tartrate (LOPRESSOR) 25 MG tablet Take 12.5 mg by mouth every other day.     multivitamin (RENA-VIT) TABS tablet Take 1 tablet by mouth daily. 30 each 0   senna-docusate (SENOKOT-S) 8.6-50 MG tablet Take 2 tablets by mouth 2 (two) times daily.     TRADJENTA 5 MG TABS tablet Take 5 mg by mouth daily.     TRESIBA FLEXTOUCH 100 UNIT/ML SOPN FlexTouch Pen Inject 17 Units into the skin at bedtime. Dinner  0   VELPHORO 500 MG chewable tablet Chew 1,500 mg by mouth 2 (two) times daily with a meal.     Doxercalciferol (HECTOROL IV) Doxercalciferol (Hectorol)     ONETOUCH VERIO test strip TEST 2 TIMES A DAY AND AS NEEDED FOR BLOOD SUGAR  0   oxyCODONE (OXY IR/ROXICODONE) 5 MG immediate release tablet Take 1 tablet (5 mg total) by mouth every 6 (six) hours as needed for severe pain (pain score 7-10). (Patient not taking: Reported on 06/12/2023) 10 tablet 0    ALLERGIES:   Allergies  Allergen Reactions   Morphine And Codeine Other (See Comments)    Per patient, morphine causes seizures    FAM HX: Family History  Problem Relation Age of Onset   Cancer Mother    Hypertension Father    Heart attack Sister    Hypertension Sister    Heart attack Paternal Aunt    Hypertension Brother    Hypertension Sister    Hypertension Sister    Hypertension Sister    Hypertension Brother    Stroke Neg Hx    Breast cancer Neg Hx     Social History:   reports that she has never smoked. She has never used smokeless tobacco. She reports that she does not drink alcohol and does not use drugs.  ROS: 12 system relevant ROS neg except per HPI  Blood pressure 136/69, pulse  85, height 5\' 6"  (1.676 m), weight (!) 149.7 kg, SpO2 100%. PHYSICAL EXAM: Gen: obese woman comfortable in bed  Eyes: anicteric ENT: class 3 airway Neck: thick CV:  RRR Back: clear anteriorly Extr:  LUE BC AVF aneurysmal with normal overlying skin. Pulsatile and noncollapsing    No results found for this or any previous visit (from the past 48 hours).  No results found.  Assessment/Plan: [name] is an  68 y.o. female with ESRD on HD, HL, HTN, h/o remote seizures, HFpEF who presents for evaluation of low AF at dialysis.   **ESRD with HD access malfunction:  low AF + high VP yesterday.  Exam suggests outflow stenosis and has h/o arch stenosis.  Discussed risk/benefit/alternative and she elects to proceed to angiogram and angioplasty if stenosis identified.  Will use local lidocaine and conscious sedation with fentanyl and versed.    Tyler Pita 06/18/2023, 12:52 PM

## 2023-06-19 DIAGNOSIS — N2581 Secondary hyperparathyroidism of renal origin: Secondary | ICD-10-CM | POA: Diagnosis not present

## 2023-06-19 DIAGNOSIS — E1122 Type 2 diabetes mellitus with diabetic chronic kidney disease: Secondary | ICD-10-CM | POA: Diagnosis not present

## 2023-06-19 DIAGNOSIS — N186 End stage renal disease: Secondary | ICD-10-CM | POA: Diagnosis not present

## 2023-06-19 DIAGNOSIS — D509 Iron deficiency anemia, unspecified: Secondary | ICD-10-CM | POA: Diagnosis not present

## 2023-06-19 DIAGNOSIS — D631 Anemia in chronic kidney disease: Secondary | ICD-10-CM | POA: Diagnosis not present

## 2023-06-19 DIAGNOSIS — Z992 Dependence on renal dialysis: Secondary | ICD-10-CM | POA: Diagnosis not present

## 2023-06-19 DIAGNOSIS — R52 Pain, unspecified: Secondary | ICD-10-CM | POA: Diagnosis not present

## 2023-06-19 DIAGNOSIS — D689 Coagulation defect, unspecified: Secondary | ICD-10-CM | POA: Diagnosis not present

## 2023-06-22 DIAGNOSIS — E1122 Type 2 diabetes mellitus with diabetic chronic kidney disease: Secondary | ICD-10-CM | POA: Diagnosis not present

## 2023-06-22 DIAGNOSIS — Z992 Dependence on renal dialysis: Secondary | ICD-10-CM | POA: Diagnosis not present

## 2023-06-22 DIAGNOSIS — N186 End stage renal disease: Secondary | ICD-10-CM | POA: Diagnosis not present

## 2023-06-22 DIAGNOSIS — R52 Pain, unspecified: Secondary | ICD-10-CM | POA: Diagnosis not present

## 2023-06-22 DIAGNOSIS — N2581 Secondary hyperparathyroidism of renal origin: Secondary | ICD-10-CM | POA: Diagnosis not present

## 2023-06-22 DIAGNOSIS — D689 Coagulation defect, unspecified: Secondary | ICD-10-CM | POA: Diagnosis not present

## 2023-06-22 DIAGNOSIS — D631 Anemia in chronic kidney disease: Secondary | ICD-10-CM | POA: Diagnosis not present

## 2023-06-22 DIAGNOSIS — D509 Iron deficiency anemia, unspecified: Secondary | ICD-10-CM | POA: Diagnosis not present

## 2023-06-24 DIAGNOSIS — E1122 Type 2 diabetes mellitus with diabetic chronic kidney disease: Secondary | ICD-10-CM | POA: Diagnosis not present

## 2023-06-24 DIAGNOSIS — Z992 Dependence on renal dialysis: Secondary | ICD-10-CM | POA: Diagnosis not present

## 2023-06-24 DIAGNOSIS — D509 Iron deficiency anemia, unspecified: Secondary | ICD-10-CM | POA: Diagnosis not present

## 2023-06-24 DIAGNOSIS — N2581 Secondary hyperparathyroidism of renal origin: Secondary | ICD-10-CM | POA: Diagnosis not present

## 2023-06-24 DIAGNOSIS — N186 End stage renal disease: Secondary | ICD-10-CM | POA: Diagnosis not present

## 2023-06-24 DIAGNOSIS — D631 Anemia in chronic kidney disease: Secondary | ICD-10-CM | POA: Diagnosis not present

## 2023-06-24 DIAGNOSIS — D689 Coagulation defect, unspecified: Secondary | ICD-10-CM | POA: Diagnosis not present

## 2023-06-24 DIAGNOSIS — R52 Pain, unspecified: Secondary | ICD-10-CM | POA: Diagnosis not present

## 2023-06-25 ENCOUNTER — Ambulatory Visit: Payer: Medicare Other | Admitting: Podiatry

## 2023-06-26 DIAGNOSIS — N186 End stage renal disease: Secondary | ICD-10-CM | POA: Diagnosis not present

## 2023-06-26 DIAGNOSIS — R52 Pain, unspecified: Secondary | ICD-10-CM | POA: Diagnosis not present

## 2023-06-26 DIAGNOSIS — Z992 Dependence on renal dialysis: Secondary | ICD-10-CM | POA: Diagnosis not present

## 2023-06-26 DIAGNOSIS — D509 Iron deficiency anemia, unspecified: Secondary | ICD-10-CM | POA: Diagnosis not present

## 2023-06-26 DIAGNOSIS — N2581 Secondary hyperparathyroidism of renal origin: Secondary | ICD-10-CM | POA: Diagnosis not present

## 2023-06-26 DIAGNOSIS — D631 Anemia in chronic kidney disease: Secondary | ICD-10-CM | POA: Diagnosis not present

## 2023-06-26 DIAGNOSIS — D689 Coagulation defect, unspecified: Secondary | ICD-10-CM | POA: Diagnosis not present

## 2023-06-26 DIAGNOSIS — E1122 Type 2 diabetes mellitus with diabetic chronic kidney disease: Secondary | ICD-10-CM | POA: Diagnosis not present

## 2023-06-29 DIAGNOSIS — Z992 Dependence on renal dialysis: Secondary | ICD-10-CM | POA: Diagnosis not present

## 2023-06-29 DIAGNOSIS — D631 Anemia in chronic kidney disease: Secondary | ICD-10-CM | POA: Diagnosis not present

## 2023-06-29 DIAGNOSIS — D689 Coagulation defect, unspecified: Secondary | ICD-10-CM | POA: Diagnosis not present

## 2023-06-29 DIAGNOSIS — E1122 Type 2 diabetes mellitus with diabetic chronic kidney disease: Secondary | ICD-10-CM | POA: Diagnosis not present

## 2023-06-29 DIAGNOSIS — D509 Iron deficiency anemia, unspecified: Secondary | ICD-10-CM | POA: Diagnosis not present

## 2023-06-29 DIAGNOSIS — N2581 Secondary hyperparathyroidism of renal origin: Secondary | ICD-10-CM | POA: Diagnosis not present

## 2023-06-29 DIAGNOSIS — N186 End stage renal disease: Secondary | ICD-10-CM | POA: Diagnosis not present

## 2023-06-29 DIAGNOSIS — R52 Pain, unspecified: Secondary | ICD-10-CM | POA: Diagnosis not present

## 2023-07-01 DIAGNOSIS — R52 Pain, unspecified: Secondary | ICD-10-CM | POA: Diagnosis not present

## 2023-07-01 DIAGNOSIS — Z992 Dependence on renal dialysis: Secondary | ICD-10-CM | POA: Diagnosis not present

## 2023-07-01 DIAGNOSIS — N2581 Secondary hyperparathyroidism of renal origin: Secondary | ICD-10-CM | POA: Diagnosis not present

## 2023-07-01 DIAGNOSIS — N186 End stage renal disease: Secondary | ICD-10-CM | POA: Diagnosis not present

## 2023-07-01 DIAGNOSIS — D689 Coagulation defect, unspecified: Secondary | ICD-10-CM | POA: Diagnosis not present

## 2023-07-01 DIAGNOSIS — D509 Iron deficiency anemia, unspecified: Secondary | ICD-10-CM | POA: Diagnosis not present

## 2023-07-01 DIAGNOSIS — E1122 Type 2 diabetes mellitus with diabetic chronic kidney disease: Secondary | ICD-10-CM | POA: Diagnosis not present

## 2023-07-01 DIAGNOSIS — D631 Anemia in chronic kidney disease: Secondary | ICD-10-CM | POA: Diagnosis not present

## 2023-07-03 DIAGNOSIS — D509 Iron deficiency anemia, unspecified: Secondary | ICD-10-CM | POA: Diagnosis not present

## 2023-07-03 DIAGNOSIS — D631 Anemia in chronic kidney disease: Secondary | ICD-10-CM | POA: Diagnosis not present

## 2023-07-03 DIAGNOSIS — D689 Coagulation defect, unspecified: Secondary | ICD-10-CM | POA: Diagnosis not present

## 2023-07-03 DIAGNOSIS — N186 End stage renal disease: Secondary | ICD-10-CM | POA: Diagnosis not present

## 2023-07-03 DIAGNOSIS — N2581 Secondary hyperparathyroidism of renal origin: Secondary | ICD-10-CM | POA: Diagnosis not present

## 2023-07-03 DIAGNOSIS — Z992 Dependence on renal dialysis: Secondary | ICD-10-CM | POA: Diagnosis not present

## 2023-07-03 DIAGNOSIS — I129 Hypertensive chronic kidney disease with stage 1 through stage 4 chronic kidney disease, or unspecified chronic kidney disease: Secondary | ICD-10-CM | POA: Diagnosis not present

## 2023-07-03 DIAGNOSIS — R52 Pain, unspecified: Secondary | ICD-10-CM | POA: Diagnosis not present

## 2023-07-03 DIAGNOSIS — E1122 Type 2 diabetes mellitus with diabetic chronic kidney disease: Secondary | ICD-10-CM | POA: Diagnosis not present

## 2023-07-06 DIAGNOSIS — N2581 Secondary hyperparathyroidism of renal origin: Secondary | ICD-10-CM | POA: Diagnosis not present

## 2023-07-06 DIAGNOSIS — N186 End stage renal disease: Secondary | ICD-10-CM | POA: Diagnosis not present

## 2023-07-06 DIAGNOSIS — D689 Coagulation defect, unspecified: Secondary | ICD-10-CM | POA: Diagnosis not present

## 2023-07-06 DIAGNOSIS — Z992 Dependence on renal dialysis: Secondary | ICD-10-CM | POA: Diagnosis not present

## 2023-07-08 DIAGNOSIS — D689 Coagulation defect, unspecified: Secondary | ICD-10-CM | POA: Diagnosis not present

## 2023-07-08 DIAGNOSIS — N186 End stage renal disease: Secondary | ICD-10-CM | POA: Diagnosis not present

## 2023-07-08 DIAGNOSIS — N2581 Secondary hyperparathyroidism of renal origin: Secondary | ICD-10-CM | POA: Diagnosis not present

## 2023-07-08 DIAGNOSIS — Z992 Dependence on renal dialysis: Secondary | ICD-10-CM | POA: Diagnosis not present

## 2023-07-10 DIAGNOSIS — D689 Coagulation defect, unspecified: Secondary | ICD-10-CM | POA: Diagnosis not present

## 2023-07-10 DIAGNOSIS — N2581 Secondary hyperparathyroidism of renal origin: Secondary | ICD-10-CM | POA: Diagnosis not present

## 2023-07-10 DIAGNOSIS — N186 End stage renal disease: Secondary | ICD-10-CM | POA: Diagnosis not present

## 2023-07-10 DIAGNOSIS — Z992 Dependence on renal dialysis: Secondary | ICD-10-CM | POA: Diagnosis not present

## 2023-07-13 DIAGNOSIS — D689 Coagulation defect, unspecified: Secondary | ICD-10-CM | POA: Diagnosis not present

## 2023-07-13 DIAGNOSIS — Z992 Dependence on renal dialysis: Secondary | ICD-10-CM | POA: Diagnosis not present

## 2023-07-13 DIAGNOSIS — N2581 Secondary hyperparathyroidism of renal origin: Secondary | ICD-10-CM | POA: Diagnosis not present

## 2023-07-13 DIAGNOSIS — N186 End stage renal disease: Secondary | ICD-10-CM | POA: Diagnosis not present

## 2023-07-15 DIAGNOSIS — N186 End stage renal disease: Secondary | ICD-10-CM | POA: Diagnosis not present

## 2023-07-15 DIAGNOSIS — D689 Coagulation defect, unspecified: Secondary | ICD-10-CM | POA: Diagnosis not present

## 2023-07-15 DIAGNOSIS — N2581 Secondary hyperparathyroidism of renal origin: Secondary | ICD-10-CM | POA: Diagnosis not present

## 2023-07-15 DIAGNOSIS — Z992 Dependence on renal dialysis: Secondary | ICD-10-CM | POA: Diagnosis not present

## 2023-07-17 DIAGNOSIS — Z992 Dependence on renal dialysis: Secondary | ICD-10-CM | POA: Diagnosis not present

## 2023-07-17 DIAGNOSIS — N186 End stage renal disease: Secondary | ICD-10-CM | POA: Diagnosis not present

## 2023-07-17 DIAGNOSIS — D689 Coagulation defect, unspecified: Secondary | ICD-10-CM | POA: Diagnosis not present

## 2023-07-17 DIAGNOSIS — N2581 Secondary hyperparathyroidism of renal origin: Secondary | ICD-10-CM | POA: Diagnosis not present

## 2023-07-20 DIAGNOSIS — N2581 Secondary hyperparathyroidism of renal origin: Secondary | ICD-10-CM | POA: Diagnosis not present

## 2023-07-20 DIAGNOSIS — Z992 Dependence on renal dialysis: Secondary | ICD-10-CM | POA: Diagnosis not present

## 2023-07-20 DIAGNOSIS — N186 End stage renal disease: Secondary | ICD-10-CM | POA: Diagnosis not present

## 2023-07-20 DIAGNOSIS — D689 Coagulation defect, unspecified: Secondary | ICD-10-CM | POA: Diagnosis not present

## 2023-07-22 DIAGNOSIS — N186 End stage renal disease: Secondary | ICD-10-CM | POA: Diagnosis not present

## 2023-07-22 DIAGNOSIS — D689 Coagulation defect, unspecified: Secondary | ICD-10-CM | POA: Diagnosis not present

## 2023-07-22 DIAGNOSIS — N2581 Secondary hyperparathyroidism of renal origin: Secondary | ICD-10-CM | POA: Diagnosis not present

## 2023-07-22 DIAGNOSIS — Z992 Dependence on renal dialysis: Secondary | ICD-10-CM | POA: Diagnosis not present

## 2023-07-24 DIAGNOSIS — N186 End stage renal disease: Secondary | ICD-10-CM | POA: Diagnosis not present

## 2023-07-24 DIAGNOSIS — D689 Coagulation defect, unspecified: Secondary | ICD-10-CM | POA: Diagnosis not present

## 2023-07-24 DIAGNOSIS — N2581 Secondary hyperparathyroidism of renal origin: Secondary | ICD-10-CM | POA: Diagnosis not present

## 2023-07-24 DIAGNOSIS — Z992 Dependence on renal dialysis: Secondary | ICD-10-CM | POA: Diagnosis not present

## 2023-07-27 DIAGNOSIS — Z992 Dependence on renal dialysis: Secondary | ICD-10-CM | POA: Diagnosis not present

## 2023-07-27 DIAGNOSIS — N186 End stage renal disease: Secondary | ICD-10-CM | POA: Diagnosis not present

## 2023-07-27 DIAGNOSIS — D689 Coagulation defect, unspecified: Secondary | ICD-10-CM | POA: Diagnosis not present

## 2023-07-27 DIAGNOSIS — N2581 Secondary hyperparathyroidism of renal origin: Secondary | ICD-10-CM | POA: Diagnosis not present

## 2023-07-28 ENCOUNTER — Ambulatory Visit: Payer: Medicare Other | Admitting: Podiatry

## 2023-07-29 DIAGNOSIS — Z992 Dependence on renal dialysis: Secondary | ICD-10-CM | POA: Diagnosis not present

## 2023-07-29 DIAGNOSIS — N2581 Secondary hyperparathyroidism of renal origin: Secondary | ICD-10-CM | POA: Diagnosis not present

## 2023-07-29 DIAGNOSIS — D689 Coagulation defect, unspecified: Secondary | ICD-10-CM | POA: Diagnosis not present

## 2023-07-29 DIAGNOSIS — N186 End stage renal disease: Secondary | ICD-10-CM | POA: Diagnosis not present

## 2023-07-31 DIAGNOSIS — N186 End stage renal disease: Secondary | ICD-10-CM | POA: Diagnosis not present

## 2023-07-31 DIAGNOSIS — D689 Coagulation defect, unspecified: Secondary | ICD-10-CM | POA: Diagnosis not present

## 2023-07-31 DIAGNOSIS — N2581 Secondary hyperparathyroidism of renal origin: Secondary | ICD-10-CM | POA: Diagnosis not present

## 2023-07-31 DIAGNOSIS — I129 Hypertensive chronic kidney disease with stage 1 through stage 4 chronic kidney disease, or unspecified chronic kidney disease: Secondary | ICD-10-CM | POA: Diagnosis not present

## 2023-07-31 DIAGNOSIS — Z992 Dependence on renal dialysis: Secondary | ICD-10-CM | POA: Diagnosis not present

## 2023-08-03 DIAGNOSIS — N2581 Secondary hyperparathyroidism of renal origin: Secondary | ICD-10-CM | POA: Diagnosis not present

## 2023-08-03 DIAGNOSIS — N186 End stage renal disease: Secondary | ICD-10-CM | POA: Diagnosis not present

## 2023-08-03 DIAGNOSIS — D689 Coagulation defect, unspecified: Secondary | ICD-10-CM | POA: Diagnosis not present

## 2023-08-03 DIAGNOSIS — Z992 Dependence on renal dialysis: Secondary | ICD-10-CM | POA: Diagnosis not present

## 2023-08-05 DIAGNOSIS — Z992 Dependence on renal dialysis: Secondary | ICD-10-CM | POA: Diagnosis not present

## 2023-08-05 DIAGNOSIS — N186 End stage renal disease: Secondary | ICD-10-CM | POA: Diagnosis not present

## 2023-08-05 DIAGNOSIS — D689 Coagulation defect, unspecified: Secondary | ICD-10-CM | POA: Diagnosis not present

## 2023-08-05 DIAGNOSIS — N2581 Secondary hyperparathyroidism of renal origin: Secondary | ICD-10-CM | POA: Diagnosis not present

## 2023-08-07 DIAGNOSIS — N186 End stage renal disease: Secondary | ICD-10-CM | POA: Diagnosis not present

## 2023-08-07 DIAGNOSIS — Z992 Dependence on renal dialysis: Secondary | ICD-10-CM | POA: Diagnosis not present

## 2023-08-07 DIAGNOSIS — D689 Coagulation defect, unspecified: Secondary | ICD-10-CM | POA: Diagnosis not present

## 2023-08-07 DIAGNOSIS — N2581 Secondary hyperparathyroidism of renal origin: Secondary | ICD-10-CM | POA: Diagnosis not present

## 2023-08-10 DIAGNOSIS — N186 End stage renal disease: Secondary | ICD-10-CM | POA: Diagnosis not present

## 2023-08-10 DIAGNOSIS — D689 Coagulation defect, unspecified: Secondary | ICD-10-CM | POA: Diagnosis not present

## 2023-08-10 DIAGNOSIS — N2581 Secondary hyperparathyroidism of renal origin: Secondary | ICD-10-CM | POA: Diagnosis not present

## 2023-08-10 DIAGNOSIS — Z992 Dependence on renal dialysis: Secondary | ICD-10-CM | POA: Diagnosis not present

## 2023-08-12 DIAGNOSIS — D689 Coagulation defect, unspecified: Secondary | ICD-10-CM | POA: Diagnosis not present

## 2023-08-12 DIAGNOSIS — N2581 Secondary hyperparathyroidism of renal origin: Secondary | ICD-10-CM | POA: Diagnosis not present

## 2023-08-12 DIAGNOSIS — Z992 Dependence on renal dialysis: Secondary | ICD-10-CM | POA: Diagnosis not present

## 2023-08-12 DIAGNOSIS — N186 End stage renal disease: Secondary | ICD-10-CM | POA: Diagnosis not present

## 2023-08-13 ENCOUNTER — Ambulatory Visit: Payer: Medicare Other | Admitting: Podiatry

## 2023-08-13 ENCOUNTER — Ambulatory Visit: Admitting: Podiatry

## 2023-08-14 DIAGNOSIS — N186 End stage renal disease: Secondary | ICD-10-CM | POA: Diagnosis not present

## 2023-08-14 DIAGNOSIS — D689 Coagulation defect, unspecified: Secondary | ICD-10-CM | POA: Diagnosis not present

## 2023-08-14 DIAGNOSIS — Z992 Dependence on renal dialysis: Secondary | ICD-10-CM | POA: Diagnosis not present

## 2023-08-14 DIAGNOSIS — N2581 Secondary hyperparathyroidism of renal origin: Secondary | ICD-10-CM | POA: Diagnosis not present

## 2023-08-17 DIAGNOSIS — D689 Coagulation defect, unspecified: Secondary | ICD-10-CM | POA: Diagnosis not present

## 2023-08-17 DIAGNOSIS — N186 End stage renal disease: Secondary | ICD-10-CM | POA: Diagnosis not present

## 2023-08-17 DIAGNOSIS — N2581 Secondary hyperparathyroidism of renal origin: Secondary | ICD-10-CM | POA: Diagnosis not present

## 2023-08-17 DIAGNOSIS — Z992 Dependence on renal dialysis: Secondary | ICD-10-CM | POA: Diagnosis not present

## 2023-08-19 DIAGNOSIS — Z992 Dependence on renal dialysis: Secondary | ICD-10-CM | POA: Diagnosis not present

## 2023-08-19 DIAGNOSIS — N186 End stage renal disease: Secondary | ICD-10-CM | POA: Diagnosis not present

## 2023-08-19 DIAGNOSIS — N2581 Secondary hyperparathyroidism of renal origin: Secondary | ICD-10-CM | POA: Diagnosis not present

## 2023-08-19 DIAGNOSIS — D689 Coagulation defect, unspecified: Secondary | ICD-10-CM | POA: Diagnosis not present

## 2023-08-21 DIAGNOSIS — N186 End stage renal disease: Secondary | ICD-10-CM | POA: Diagnosis not present

## 2023-08-21 DIAGNOSIS — D689 Coagulation defect, unspecified: Secondary | ICD-10-CM | POA: Diagnosis not present

## 2023-08-21 DIAGNOSIS — N2581 Secondary hyperparathyroidism of renal origin: Secondary | ICD-10-CM | POA: Diagnosis not present

## 2023-08-21 DIAGNOSIS — Z992 Dependence on renal dialysis: Secondary | ICD-10-CM | POA: Diagnosis not present

## 2023-08-24 DIAGNOSIS — N2581 Secondary hyperparathyroidism of renal origin: Secondary | ICD-10-CM | POA: Diagnosis not present

## 2023-08-24 DIAGNOSIS — Z992 Dependence on renal dialysis: Secondary | ICD-10-CM | POA: Diagnosis not present

## 2023-08-24 DIAGNOSIS — D689 Coagulation defect, unspecified: Secondary | ICD-10-CM | POA: Diagnosis not present

## 2023-08-24 DIAGNOSIS — N186 End stage renal disease: Secondary | ICD-10-CM | POA: Diagnosis not present

## 2023-08-26 DIAGNOSIS — N186 End stage renal disease: Secondary | ICD-10-CM | POA: Diagnosis not present

## 2023-08-26 DIAGNOSIS — N2581 Secondary hyperparathyroidism of renal origin: Secondary | ICD-10-CM | POA: Diagnosis not present

## 2023-08-26 DIAGNOSIS — D689 Coagulation defect, unspecified: Secondary | ICD-10-CM | POA: Diagnosis not present

## 2023-08-26 DIAGNOSIS — Z992 Dependence on renal dialysis: Secondary | ICD-10-CM | POA: Diagnosis not present

## 2023-08-27 DIAGNOSIS — M25561 Pain in right knee: Secondary | ICD-10-CM | POA: Diagnosis not present

## 2023-08-27 DIAGNOSIS — M25562 Pain in left knee: Secondary | ICD-10-CM | POA: Diagnosis not present

## 2023-08-28 DIAGNOSIS — Z992 Dependence on renal dialysis: Secondary | ICD-10-CM | POA: Diagnosis not present

## 2023-08-28 DIAGNOSIS — N2581 Secondary hyperparathyroidism of renal origin: Secondary | ICD-10-CM | POA: Diagnosis not present

## 2023-08-28 DIAGNOSIS — D689 Coagulation defect, unspecified: Secondary | ICD-10-CM | POA: Diagnosis not present

## 2023-08-28 DIAGNOSIS — N186 End stage renal disease: Secondary | ICD-10-CM | POA: Diagnosis not present

## 2023-08-31 DIAGNOSIS — N186 End stage renal disease: Secondary | ICD-10-CM | POA: Diagnosis not present

## 2023-08-31 DIAGNOSIS — I129 Hypertensive chronic kidney disease with stage 1 through stage 4 chronic kidney disease, or unspecified chronic kidney disease: Secondary | ICD-10-CM | POA: Diagnosis not present

## 2023-08-31 DIAGNOSIS — Z992 Dependence on renal dialysis: Secondary | ICD-10-CM | POA: Diagnosis not present

## 2023-08-31 DIAGNOSIS — D689 Coagulation defect, unspecified: Secondary | ICD-10-CM | POA: Diagnosis not present

## 2023-08-31 DIAGNOSIS — N2581 Secondary hyperparathyroidism of renal origin: Secondary | ICD-10-CM | POA: Diagnosis not present

## 2023-09-01 ENCOUNTER — Encounter: Payer: Self-pay | Admitting: Podiatry

## 2023-09-01 ENCOUNTER — Ambulatory Visit: Admitting: Podiatry

## 2023-09-01 DIAGNOSIS — E1121 Type 2 diabetes mellitus with diabetic nephropathy: Secondary | ICD-10-CM | POA: Diagnosis not present

## 2023-09-01 DIAGNOSIS — M79674 Pain in right toe(s): Secondary | ICD-10-CM | POA: Diagnosis not present

## 2023-09-01 DIAGNOSIS — B351 Tinea unguium: Secondary | ICD-10-CM

## 2023-09-01 DIAGNOSIS — M79675 Pain in left toe(s): Secondary | ICD-10-CM

## 2023-09-01 DIAGNOSIS — L84 Corns and callosities: Secondary | ICD-10-CM | POA: Insufficient documentation

## 2023-09-02 DIAGNOSIS — N186 End stage renal disease: Secondary | ICD-10-CM | POA: Diagnosis not present

## 2023-09-02 DIAGNOSIS — D631 Anemia in chronic kidney disease: Secondary | ICD-10-CM | POA: Diagnosis not present

## 2023-09-02 DIAGNOSIS — D509 Iron deficiency anemia, unspecified: Secondary | ICD-10-CM | POA: Diagnosis not present

## 2023-09-02 DIAGNOSIS — E1122 Type 2 diabetes mellitus with diabetic chronic kidney disease: Secondary | ICD-10-CM | POA: Diagnosis not present

## 2023-09-02 DIAGNOSIS — N2581 Secondary hyperparathyroidism of renal origin: Secondary | ICD-10-CM | POA: Diagnosis not present

## 2023-09-02 DIAGNOSIS — D689 Coagulation defect, unspecified: Secondary | ICD-10-CM | POA: Diagnosis not present

## 2023-09-02 DIAGNOSIS — Z992 Dependence on renal dialysis: Secondary | ICD-10-CM | POA: Diagnosis not present

## 2023-09-02 DIAGNOSIS — R52 Pain, unspecified: Secondary | ICD-10-CM | POA: Diagnosis not present

## 2023-09-02 NOTE — Progress Notes (Signed)
 This patient returns to my office for at risk foot care.  This patient requires this care by a professional since this patient will be at risk due to having type 2 diabetes and ESRD, This patient is unable to cut nails herself since the patient cannot reach her nails.These nails are painful walking and wearing shoes. She presents to the office with her husband and in a wheelchair..  This patient presents for at risk foot care today.  General Appearance  Alert, conversant and in no acute stress.  Vascular  Dorsalis pedis and posterior tibial  pulses are palpable  bilaterally.  Capillary return is within normal limits  bilaterally. Temperature is within normal limits  bilaterally.  Neurologic  Senn-Weinstein monofilament wire test within normal limits  bilaterally. Muscle power within normal limits bilaterally.  Nails Thick disfigured discolored nails with subungual debris  from hallux to fifth toes bilaterally. No evidence of bacterial infection or drainage bilaterally.  Orthopedic  No limitations of motion  feet .  No crepitus or effusions noted.  No bony pathology or digital deformities noted.  Skin  normotropic skin with no porokeratosis noted bilaterally.  No signs of infections or ulcers noted.   Left hael callus noted medially  Onychomycosis  Pain in right toes  Pain in left toes  Callus left foot.  Consent was obtained for treatment procedures.   Mechanical debridement of nails 1-5  bilaterally performed with a nail nipper.  Filed with dremel without incident.  Debrided callus with left foot with dremel tool.   Return office visit   3 months                   Told patient to return for periodic foot care and evaluation due to potential at risk complications.   Helane Gunther DPM

## 2023-09-04 DIAGNOSIS — D689 Coagulation defect, unspecified: Secondary | ICD-10-CM | POA: Diagnosis not present

## 2023-09-04 DIAGNOSIS — N2581 Secondary hyperparathyroidism of renal origin: Secondary | ICD-10-CM | POA: Diagnosis not present

## 2023-09-04 DIAGNOSIS — R52 Pain, unspecified: Secondary | ICD-10-CM | POA: Diagnosis not present

## 2023-09-04 DIAGNOSIS — Z992 Dependence on renal dialysis: Secondary | ICD-10-CM | POA: Diagnosis not present

## 2023-09-04 DIAGNOSIS — E1122 Type 2 diabetes mellitus with diabetic chronic kidney disease: Secondary | ICD-10-CM | POA: Diagnosis not present

## 2023-09-04 DIAGNOSIS — D509 Iron deficiency anemia, unspecified: Secondary | ICD-10-CM | POA: Diagnosis not present

## 2023-09-04 DIAGNOSIS — D631 Anemia in chronic kidney disease: Secondary | ICD-10-CM | POA: Diagnosis not present

## 2023-09-04 DIAGNOSIS — N186 End stage renal disease: Secondary | ICD-10-CM | POA: Diagnosis not present

## 2023-09-07 DIAGNOSIS — N2581 Secondary hyperparathyroidism of renal origin: Secondary | ICD-10-CM | POA: Diagnosis not present

## 2023-09-07 DIAGNOSIS — D689 Coagulation defect, unspecified: Secondary | ICD-10-CM | POA: Diagnosis not present

## 2023-09-07 DIAGNOSIS — D631 Anemia in chronic kidney disease: Secondary | ICD-10-CM | POA: Diagnosis not present

## 2023-09-07 DIAGNOSIS — D509 Iron deficiency anemia, unspecified: Secondary | ICD-10-CM | POA: Diagnosis not present

## 2023-09-07 DIAGNOSIS — N186 End stage renal disease: Secondary | ICD-10-CM | POA: Diagnosis not present

## 2023-09-07 DIAGNOSIS — R52 Pain, unspecified: Secondary | ICD-10-CM | POA: Diagnosis not present

## 2023-09-07 DIAGNOSIS — E1122 Type 2 diabetes mellitus with diabetic chronic kidney disease: Secondary | ICD-10-CM | POA: Diagnosis not present

## 2023-09-07 DIAGNOSIS — Z992 Dependence on renal dialysis: Secondary | ICD-10-CM | POA: Diagnosis not present

## 2023-09-09 DIAGNOSIS — Z992 Dependence on renal dialysis: Secondary | ICD-10-CM | POA: Diagnosis not present

## 2023-09-09 DIAGNOSIS — D689 Coagulation defect, unspecified: Secondary | ICD-10-CM | POA: Diagnosis not present

## 2023-09-09 DIAGNOSIS — D509 Iron deficiency anemia, unspecified: Secondary | ICD-10-CM | POA: Diagnosis not present

## 2023-09-09 DIAGNOSIS — D631 Anemia in chronic kidney disease: Secondary | ICD-10-CM | POA: Diagnosis not present

## 2023-09-09 DIAGNOSIS — E1122 Type 2 diabetes mellitus with diabetic chronic kidney disease: Secondary | ICD-10-CM | POA: Diagnosis not present

## 2023-09-09 DIAGNOSIS — N2581 Secondary hyperparathyroidism of renal origin: Secondary | ICD-10-CM | POA: Diagnosis not present

## 2023-09-09 DIAGNOSIS — R52 Pain, unspecified: Secondary | ICD-10-CM | POA: Diagnosis not present

## 2023-09-09 DIAGNOSIS — N186 End stage renal disease: Secondary | ICD-10-CM | POA: Diagnosis not present

## 2023-09-11 DIAGNOSIS — N2581 Secondary hyperparathyroidism of renal origin: Secondary | ICD-10-CM | POA: Diagnosis not present

## 2023-09-11 DIAGNOSIS — R52 Pain, unspecified: Secondary | ICD-10-CM | POA: Diagnosis not present

## 2023-09-11 DIAGNOSIS — Z992 Dependence on renal dialysis: Secondary | ICD-10-CM | POA: Diagnosis not present

## 2023-09-11 DIAGNOSIS — D509 Iron deficiency anemia, unspecified: Secondary | ICD-10-CM | POA: Diagnosis not present

## 2023-09-11 DIAGNOSIS — D631 Anemia in chronic kidney disease: Secondary | ICD-10-CM | POA: Diagnosis not present

## 2023-09-11 DIAGNOSIS — D689 Coagulation defect, unspecified: Secondary | ICD-10-CM | POA: Diagnosis not present

## 2023-09-11 DIAGNOSIS — N186 End stage renal disease: Secondary | ICD-10-CM | POA: Diagnosis not present

## 2023-09-11 DIAGNOSIS — E1122 Type 2 diabetes mellitus with diabetic chronic kidney disease: Secondary | ICD-10-CM | POA: Diagnosis not present

## 2023-09-14 DIAGNOSIS — Z992 Dependence on renal dialysis: Secondary | ICD-10-CM | POA: Diagnosis not present

## 2023-09-14 DIAGNOSIS — R52 Pain, unspecified: Secondary | ICD-10-CM | POA: Diagnosis not present

## 2023-09-14 DIAGNOSIS — E1122 Type 2 diabetes mellitus with diabetic chronic kidney disease: Secondary | ICD-10-CM | POA: Diagnosis not present

## 2023-09-14 DIAGNOSIS — N186 End stage renal disease: Secondary | ICD-10-CM | POA: Diagnosis not present

## 2023-09-14 DIAGNOSIS — N2581 Secondary hyperparathyroidism of renal origin: Secondary | ICD-10-CM | POA: Diagnosis not present

## 2023-09-14 DIAGNOSIS — D689 Coagulation defect, unspecified: Secondary | ICD-10-CM | POA: Diagnosis not present

## 2023-09-14 DIAGNOSIS — D509 Iron deficiency anemia, unspecified: Secondary | ICD-10-CM | POA: Diagnosis not present

## 2023-09-14 DIAGNOSIS — D631 Anemia in chronic kidney disease: Secondary | ICD-10-CM | POA: Diagnosis not present

## 2023-09-15 DIAGNOSIS — N2581 Secondary hyperparathyroidism of renal origin: Secondary | ICD-10-CM | POA: Diagnosis not present

## 2023-09-15 DIAGNOSIS — D631 Anemia in chronic kidney disease: Secondary | ICD-10-CM | POA: Diagnosis not present

## 2023-09-15 DIAGNOSIS — D689 Coagulation defect, unspecified: Secondary | ICD-10-CM | POA: Diagnosis not present

## 2023-09-15 DIAGNOSIS — N186 End stage renal disease: Secondary | ICD-10-CM | POA: Diagnosis not present

## 2023-09-15 DIAGNOSIS — R52 Pain, unspecified: Secondary | ICD-10-CM | POA: Diagnosis not present

## 2023-09-15 DIAGNOSIS — D509 Iron deficiency anemia, unspecified: Secondary | ICD-10-CM | POA: Diagnosis not present

## 2023-09-15 DIAGNOSIS — E1122 Type 2 diabetes mellitus with diabetic chronic kidney disease: Secondary | ICD-10-CM | POA: Diagnosis not present

## 2023-09-15 DIAGNOSIS — Z992 Dependence on renal dialysis: Secondary | ICD-10-CM | POA: Diagnosis not present

## 2023-09-16 DIAGNOSIS — N2581 Secondary hyperparathyroidism of renal origin: Secondary | ICD-10-CM | POA: Diagnosis not present

## 2023-09-16 DIAGNOSIS — R52 Pain, unspecified: Secondary | ICD-10-CM | POA: Diagnosis not present

## 2023-09-16 DIAGNOSIS — N186 End stage renal disease: Secondary | ICD-10-CM | POA: Diagnosis not present

## 2023-09-16 DIAGNOSIS — D689 Coagulation defect, unspecified: Secondary | ICD-10-CM | POA: Diagnosis not present

## 2023-09-16 DIAGNOSIS — D509 Iron deficiency anemia, unspecified: Secondary | ICD-10-CM | POA: Diagnosis not present

## 2023-09-16 DIAGNOSIS — D631 Anemia in chronic kidney disease: Secondary | ICD-10-CM | POA: Diagnosis not present

## 2023-09-16 DIAGNOSIS — E1122 Type 2 diabetes mellitus with diabetic chronic kidney disease: Secondary | ICD-10-CM | POA: Diagnosis not present

## 2023-09-16 DIAGNOSIS — Z992 Dependence on renal dialysis: Secondary | ICD-10-CM | POA: Diagnosis not present

## 2023-09-17 ENCOUNTER — Encounter (HOSPITAL_COMMUNITY)
Admission: RE | Disposition: A | Discharge status: Home / Self Care | Payer: Self-pay | Source: Ambulatory Visit | Attending: Nephrology

## 2023-09-17 ENCOUNTER — Ambulatory Visit (HOSPITAL_COMMUNITY)
Admission: RE | Admit: 2023-09-17 | Discharge: 2023-09-17 | Disposition: A | Discharge status: Home / Self Care | Source: Ambulatory Visit | Attending: Nephrology | Admitting: Nephrology

## 2023-09-17 ENCOUNTER — Other Ambulatory Visit: Payer: Self-pay

## 2023-09-17 DIAGNOSIS — I5030 Unspecified diastolic (congestive) heart failure: Secondary | ICD-10-CM | POA: Insufficient documentation

## 2023-09-17 DIAGNOSIS — N250 Renal osteodystrophy: Secondary | ICD-10-CM | POA: Diagnosis not present

## 2023-09-17 DIAGNOSIS — I871 Compression of vein: Secondary | ICD-10-CM | POA: Diagnosis not present

## 2023-09-17 DIAGNOSIS — D631 Anemia in chronic kidney disease: Secondary | ICD-10-CM | POA: Insufficient documentation

## 2023-09-17 DIAGNOSIS — Y832 Surgical operation with anastomosis, bypass or graft as the cause of abnormal reaction of the patient, or of later complication, without mention of misadventure at the time of the procedure: Secondary | ICD-10-CM | POA: Diagnosis not present

## 2023-09-17 DIAGNOSIS — Z992 Dependence on renal dialysis: Secondary | ICD-10-CM | POA: Diagnosis not present

## 2023-09-17 DIAGNOSIS — Z8249 Family history of ischemic heart disease and other diseases of the circulatory system: Secondary | ICD-10-CM | POA: Insufficient documentation

## 2023-09-17 DIAGNOSIS — I132 Hypertensive heart and chronic kidney disease with heart failure and with stage 5 chronic kidney disease, or end stage renal disease: Secondary | ICD-10-CM | POA: Diagnosis not present

## 2023-09-17 DIAGNOSIS — T82510A Breakdown (mechanical) of surgically created arteriovenous fistula, initial encounter: Secondary | ICD-10-CM | POA: Diagnosis not present

## 2023-09-17 DIAGNOSIS — T82858A Stenosis of vascular prosthetic devices, implants and grafts, initial encounter: Secondary | ICD-10-CM | POA: Diagnosis not present

## 2023-09-17 DIAGNOSIS — N186 End stage renal disease: Secondary | ICD-10-CM | POA: Diagnosis not present

## 2023-09-17 HISTORY — PX: A/V SHUNT INTERVENTION: CATH118220

## 2023-09-17 LAB — GLUCOSE, CAPILLARY: Glucose-Capillary: 135 mg/dL — ABNORMAL HIGH (ref 70–99)

## 2023-09-17 SURGERY — A/V SHUNT INTERVENTION
Anesthesia: LOCAL

## 2023-09-17 MED ORDER — IODIXANOL 320 MG/ML IV SOLN
INTRAVENOUS | Status: DC | PRN
  Administered 2023-09-17: 10 mL via INTRAVENOUS

## 2023-09-17 MED ORDER — LIDOCAINE HCL (PF) 1 % IJ SOLN
INTRAMUSCULAR | Status: DC | PRN
  Administered 2023-09-17: 2 mL via SUBCUTANEOUS

## 2023-09-17 MED ORDER — HEPARIN (PORCINE) IN NACL 1000-0.9 UT/500ML-% IV SOLN
INTRAVENOUS | Status: DC | PRN
  Administered 2023-09-17: 500 mL

## 2023-09-17 SURGICAL SUPPLY — 4 items
BAG SNAP BAND KOVER 36X36 (MISCELLANEOUS) ×1 IMPLANT
COVER DOME SNAP 22 D (MISCELLANEOUS) ×1 IMPLANT
MAT PREVALON FULL STRYKER (MISCELLANEOUS) IMPLANT
TRAY PV CATH (CUSTOM PROCEDURE TRAY) ×1 IMPLANT

## 2023-09-17 NOTE — H&P (Addendum)
 Chief Complaint: Decreased flows  Interval H&P  The patient has presented today for an angiogram/ angioplasty.  Various methods of treatment have been discussed with the patient.  After consideration of risk, benefits and other options for treatment, the patient has consented to a angiogram/ angioplasty with  possible stent placement.   Risks of angiogram with potential angioplasty and stenting if needed.contrast reaction, extravasation/ bleeding, dissection, hypotension and death were explained to the patient.  The patient's history has been reviewed and the patient has been examined, no changes in status.  Stable for angiogram/angioplasty  I have reviewed the patient's chart and labs.  Questions were answered to the patient's satisfaction.  Assessment/Plan: ESRD  Decreased access flows left brachiocephalic fistula placed in superficialized in 2017 with last 10 mm arch angioplasty June 18, 2023- planning on angiogram with possibly angioplasty. Renal osteodystrophy - continue binders per home regimen. Anemia - managed with ESA's and IV iron at dialysis center. HTN - resume home regimen.   HPI: [name] is an 68 y.o. female with a history of hyperlipidemia, hypertension, remote history of seizures, HFpEF ESRD here for decreasing flows in the left brachiocephalic fistula which was just angioplastied at the arch with a 10 mm balloon on June 18, 2023.  ROS Per HPI.  Chemistry and CBC: Creatinine, Ser  Date/Time Value Ref Range Status  03/30/2023 07:25 AM 10.23 (H) 0.44 - 1.00 mg/dL Final  78/29/5621 30:86 AM 8.13 (H) 0.44 - 1.00 mg/dL Final  57/84/6962 95:28 AM 7.41 (H) 0.44 - 1.00 mg/dL Final  41/32/4401 02:72 AM 7.34 (H) 0.44 - 1.00 mg/dL Final  53/66/4403 47:42 AM 8.88 (H) 0.44 - 1.00 mg/dL Final  59/56/3875 64:33 AM 7.34 (H) 0.44 - 1.00 mg/dL Final  29/51/8841 66:06 AM 5.24 (H) 0.44 - 1.00 mg/dL Final  30/16/0109 32:35 AM 7.12 (H) 0.44 - 1.00 mg/dL Final  57/32/2025  42:70 AM 5.18 (H) 0.44 - 1.00 mg/dL Final    Comment:    DELTA CHECK NOTED  10/23/2015 05:24 AM 8.52 (H) 0.44 - 1.00 mg/dL Final  62/37/6283 15:17 AM 8.57 (H) 0.44 - 1.00 mg/dL Final  61/60/7371 06:26 AM 7.10 (H) 0.44 - 1.00 mg/dL Final  94/85/4627 03:50 AM 5.25 (H) 0.44 - 1.00 mg/dL Final  09/38/1829 93:71 AM 7.34 (H) 0.44 - 1.00 mg/dL Final  69/67/8938 10:17 AM 5.32 (H) 0.44 - 1.00 mg/dL Final  51/07/5850 77:82 AM 7.33 (H) 0.44 - 1.00 mg/dL Final  42/35/3614 43:15 AM 7.42 (H) 0.44 - 1.00 mg/dL Final  40/01/6760 95:09 AM 6.04 (H) 0.44 - 1.00 mg/dL Final  32/67/1245 80:99 AM 8.29 (H) 0.44 - 1.00 mg/dL Final  83/38/2505 39:76 AM 6.53 (H) 0.44 - 1.00 mg/dL Final  73/41/9379 02:40 AM 4.87 (H) 0.44 - 1.00 mg/dL Final    Comment:    DELTA CHECK NOTED  10/12/2015 06:27 AM 7.55 (H) 0.44 - 1.00 mg/dL Final  97/35/3299 24:26 AM 6.09 (H) 0.44 - 1.00 mg/dL Final  83/41/9622 29:79 AM 8.25 (H) 0.44 - 1.00 mg/dL Final  89/[birth year]/1941 74:08 AM 6.76 (H) 0.44 - 1.00 mg/dL Final    Comment:    DELTA CHECK NOTED  10/08/2015 02:57 AM 10.32 (H) 0.44 - 1.00 mg/dL Final  14/48/1856 31:49 AM 9.33 (H) 0.44 - 1.00 mg/dL Final  70/26/3785 88:50 PM 13.57 (H) 0.44 - 1.00 mg/dL Final  27/74/1287 86:76 AM 13.34 (H) 0.44 - 1.00 mg/dL Final  72/02/4708 62:83 PM 15.20 (H) 0.44 - 1.00 mg/dL Final  66/29/4765 46:50 AM 18.70 (H) 0.44 -  1.00 mg/dL Final  16/03/9603 54:09 AM 18.78 (H) 0.44 - 1.00 mg/dL Final  81/19/1478 29:56 AM 18.22 (H) 0.44 - 1.00 mg/dL Final   No results for input(s): "NA", "K", "CL", "CO2", "GLUCOSE", "BUN", "CREATININE", "CALCIUM", "PHOS" in the last 168 hours.  Invalid input(s): "ALB" No results for input(s): "WBC", "NEUTROABS", "HGB", "HCT", "MCV", "PLT" in the last 168 hours. Liver Function Tests: No results for input(s): "AST", "ALT", "ALKPHOS", "BILITOT", "PROT", "ALBUMIN" in the last 168 hours. No results for input(s): "LIPASE", "AMYLASE" in the last 168 hours. No results for input(s):  "AMMONIA" in the last 168 hours. Cardiac Enzymes: No results for input(s): "CKTOTAL", "CKMB", "CKMBINDEX", "TROPONINI" in the last 168 hours. Iron Studies: No results for input(s): "IRON", "TIBC", "TRANSFERRIN", "FERRITIN" in the last 72 hours. PT/INR: @LABRCNTIP (inr:5)  Xrays/Other Studies: ) Results for orders placed or performed during the hospital encounter of 09/17/23 (from the past 48 hours)  Glucose, capillary     Status: Abnormal   Collection Time: 09/17/23  8:12 AM  Result Value Ref Range   Glucose-Capillary 135 (H) 70 - 99 mg/dL    Comment: Glucose reference range applies only to samples taken after fasting for at least 8 hours.   No results found.  PMH:   Past Medical History:  Diagnosis Date   Arthritis    Chronic diastolic CHF (congestive heart failure) (HCC)    a. Echo 5/17: severe LVH, EF 55-60%, mild AS, mean AV gradient 10 mmHg, mod LAE, PASP 37 mmHg   Diabetes mellitus without complication (HCC)    type 2   ESRD (end stage renal disease) (HCC)    Tues, Thurs, Sat dialysis   H/O blood clots    many years ago in leg   History of pneumonia    HLD (hyperlipidemia)    Hypertension    BP low after dialysis started, no further BP meds   Seizures (HCC)    had one or two (more than 10-15 years ago)   Sleep apnea    uses c-pap most of time    PSH:   Past Surgical History:  Procedure Laterality Date   A/V FISTULAGRAM N/A 06/18/2023   Procedure: A/V Fistulagram;  Surgeon: Tyler Pita, MD;  Location: [facility];  Service: Cardiovascular;  Laterality: N/A;   ABDOMINAL HYSTERECTOMY     AV FISTULA PLACEMENT Left 07/25/2015   Procedure: ARTERIOVENOUS (AV) FISTULA CREATION -LEFT BRACHIOCEPHALIC;  Surgeon: Fransisco Hertz, MD;  Location: [facility] OR;  Service: Vascular;  Laterality: Left;   COLONOSCOPY     COLONOSCOPY WITH PROPOFOL N/A 11/18/2017   Procedure: COLONOSCOPY WITH PROPOFOL;  Surgeon: Willis Modena, MD;  Location: [facility];  Service: Endoscopy;   Laterality: N/A;   COLONOSCOPY WITH PROPOFOL Bilateral 07/23/2022   Procedure: COLONOSCOPY WITH PROPOFOL;  Surgeon: Willis Modena, MD;  Location: [facility];  Service: Gastroenterology;  Laterality: Bilateral;   FISTULA SUPERFICIALIZATION Left 11/01/2015   Procedure: SUPERFICIALIZATION OF LEFT ARM BRACHIOCEPHALIC FISTULA ;  Surgeon: Fransisco Hertz, MD;  Location: [facility] OR;  Service: Vascular;  Laterality: Left;   HEMOSTASIS CLIP PLACEMENT  07/23/2022   Procedure: HEMOSTASIS CLIP PLACEMENT;  Surgeon: Willis Modena, MD;  Location: [facility];  Service: Gastroenterology;;   INSERTION OF DIALYSIS CATHETER Right 10/09/2015   Procedure: INSERTION OF DIALYSIS CATHETER RIGHT INTERNAL JUGULAR ;  Surgeon: Chuck Hint, MD;  Location: [facility] OR;  Service: Vascular;  Laterality: Right;   PERIPHERAL VASCULAR BALLOON ANGIOPLASTY Left 06/18/2023   Procedure: PERIPHERAL VASCULAR BALLOON ANGIOPLASTY;  Surgeon: Baron Border, MD;  Location: [facility] INVASIVE CV LAB;  Service: Cardiovascular;  Laterality: Left;  cephalic arch confluence   POLYPECTOMY  11/18/2017   Procedure: POLYPECTOMY;  Surgeon: Evangeline Hilts, MD;  Location: [facility];  Service: Endoscopy;;   POLYPECTOMY  07/23/2022   Procedure: POLYPECTOMY;  Surgeon: Evangeline Hilts, MD;  Location: [facility];  Service: Gastroenterology;;    Allergies:  Allergies  Allergen Reactions   Morphine And Codeine Other (See Comments)    Per patient, morphine causes seizures    Medications:   Prior to Admission medications   Medication Sig Start Date End Date Taking? Authorizing Provider  acetaminophen (TYLENOL) 500 MG tablet Take 500 mg by mouth every 6 (six) hours as needed for moderate pain.   Yes [provider]  aspirin 81 MG tablet Take 81 mg by mouth daily.   Yes [provider]  atorvastatin (LIPITOR) 40 MG tablet Take 40 mg by mouth daily.   Yes [provider]  cinacalcet (SENSIPAR) 60 MG tablet Take 60 mg by mouth every  Monday, Wednesday, and Friday. After dialysis   Yes [provider]  diclofenac Sodium (VOLTAREN) 1 % GEL Apply 1 Application topically 3 (three) times daily as needed (pain).   Yes [provider]  febuxostat (ULORIC) 40 MG tablet Take 40 mg by mouth daily.   Yes [provider]  HUMALOG KWIKPEN 100 UNIT/ML KwikPen Inject 17 Units into the skin 3 (three) times daily. 07/03/20  Yes [provider]  lidocaine-prilocaine (EMLA) cream Apply 1 application topically as needed (local anesthesia).   Yes [provider]  metoprolol tartrate (LOPRESSOR) 25 MG tablet Take 12.5 mg by mouth every Monday, Wednesday, and Friday. After Dialysis   Yes [provider]  multivitamin (RENA-VIT) TABS tablet Take 1 tablet by mouth daily. 11/06/15  Yes Short, Tonita Frater, MD  senna-docusate (SENOKOT-S) 8.6-50 MG tablet Take 2 tablets by mouth 2 (two) times daily.   Yes [provider]  TRADJENTA 5 MG TABS tablet Take 5 mg by mouth daily. 11/16/18  Yes [provider]  TRESIBA FLEXTOUCH 100 UNIT/ML SOPN FlexTouch Pen Inject 17 Units into the skin daily as needed (High BS). 11/17/16  Yes [provider]  VELPHORO 500 MG chewable tablet Chew 1,000 mg by mouth 3 (three) times daily with meals. 05/04/23  Yes [provider]  ONETOUCH VERIO test strip TEST 2 TIMES A DAY AND AS NEEDED FOR BLOOD SUGAR 11/30/15   [provider]    Discontinued Meds:   Medications Discontinued During This Encounter  Medication Reason   oxyCODONE (OXY IR/ROXICODONE) 5 MG immediate release tablet Patient Preference    Social History:  reports that she has never smoked. She has never used smokeless tobacco. She reports that she does not drink alcohol and does not use drugs.  Family History:   Family History  Problem Relation Age of Onset   Cancer Mother    Hypertension Father    Heart attack Sister    Hypertension Sister    Heart attack Paternal Aunt     Hypertension Brother    Hypertension Sister    Hypertension Sister    Hypertension Sister    Hypertension Brother    Stroke Neg Hx    Breast cancer Neg Hx     Blood pressure 136/84, pulse 75, temperature 97.8 F (36.6 C), temperature source Oral, resp. rate 16, SpO2 95%. Gen: obese woman comfortable in bed  Eyes: anicteric ENT: class 3 airway  Neck: thick CV:  RRR Back: clear anteriorly Extr:  LUE BC AVF aneurysmal with normal overlying skin. Pulsatile and noncollapsing       Patrick Boor, MD 09/17/2023, 8:26 AM

## 2023-09-17 NOTE — Discharge Instructions (Signed)

## 2023-09-17 NOTE — Op Note (Signed)
 Patient presents with decreased access flows of with recent 10 mm angioplasty of the arch on June 18, 2023 by Dr. Higinio Love.  On examination, the brachial cephalic fistula is hyperpulsatile at the very large aneurysmal body.  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  Was not able to visualize the inflow because of the caliber of the aneurysm. 3)      The centrals were widely patent. 4)      This left BCF remains amenable to future percutaneous intervention.  Description of procedure: The arm was prepped and draped in the usual sterile fashion. The left upper arm brachial cephalic fistula was cannulated (40981) with an 18G Angiocath needle directed in an antegrade direction.  Contrast [phone]) injection via the end of hte IV was performed. 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  There is a collateral present close to the arch and we used a caudal view to visualize the arch confluence better and there was no evidence of significant stenosis.  No evidence of concentration of the contrast. The  left centrals were patent and able to visualize the inflow anastomosis was of the caliber of the aneurysm.  Flows were rapid.    Hemostasis: Focal pressure was placed at the cannulation site on removal of the IV.  Sedation: None  Contrast. 10 mL  Monitoring: Because of the patient's comorbid conditions and sedation during the procedure, continuous EKG monitoring and O2 saturation monitoring was performed throughout the procedure by the RN. There were no abnormal arrhythmias encountered.  Complications: None.   Diagnoses: I87.1 Stricture of vein  N18.6 ESRD T82.858A Stricture of  access  Procedure Coding:  36901 Cannulation and angiogram of fistula Q9967 Contrast  Recommendations:  1. Continue to cannulate the fistula with 15G needles.  2. Refer back for problems with flows.  Discharge: The patient was discharged home in stable condition. The patient was given education regarding the care of the dialysis access AVF and specific instructions in case of any problems.

## 2023-09-18 ENCOUNTER — Encounter (HOSPITAL_COMMUNITY): Payer: Self-pay | Admitting: Nephrology

## 2023-09-18 DIAGNOSIS — R52 Pain, unspecified: Secondary | ICD-10-CM | POA: Diagnosis not present

## 2023-09-18 DIAGNOSIS — D509 Iron deficiency anemia, unspecified: Secondary | ICD-10-CM | POA: Diagnosis not present

## 2023-09-18 DIAGNOSIS — N2581 Secondary hyperparathyroidism of renal origin: Secondary | ICD-10-CM | POA: Diagnosis not present

## 2023-09-18 DIAGNOSIS — Z992 Dependence on renal dialysis: Secondary | ICD-10-CM | POA: Diagnosis not present

## 2023-09-18 DIAGNOSIS — E1122 Type 2 diabetes mellitus with diabetic chronic kidney disease: Secondary | ICD-10-CM | POA: Diagnosis not present

## 2023-09-18 DIAGNOSIS — D631 Anemia in chronic kidney disease: Secondary | ICD-10-CM | POA: Diagnosis not present

## 2023-09-18 DIAGNOSIS — N186 End stage renal disease: Secondary | ICD-10-CM | POA: Diagnosis not present

## 2023-09-18 DIAGNOSIS — D689 Coagulation defect, unspecified: Secondary | ICD-10-CM | POA: Diagnosis not present

## 2023-09-21 DIAGNOSIS — R52 Pain, unspecified: Secondary | ICD-10-CM | POA: Diagnosis not present

## 2023-09-21 DIAGNOSIS — N2581 Secondary hyperparathyroidism of renal origin: Secondary | ICD-10-CM | POA: Diagnosis not present

## 2023-09-21 DIAGNOSIS — D689 Coagulation defect, unspecified: Secondary | ICD-10-CM | POA: Diagnosis not present

## 2023-09-21 DIAGNOSIS — N186 End stage renal disease: Secondary | ICD-10-CM | POA: Diagnosis not present

## 2023-09-21 DIAGNOSIS — D631 Anemia in chronic kidney disease: Secondary | ICD-10-CM | POA: Diagnosis not present

## 2023-09-21 DIAGNOSIS — Z992 Dependence on renal dialysis: Secondary | ICD-10-CM | POA: Diagnosis not present

## 2023-09-21 DIAGNOSIS — E1122 Type 2 diabetes mellitus with diabetic chronic kidney disease: Secondary | ICD-10-CM | POA: Diagnosis not present

## 2023-09-21 DIAGNOSIS — D509 Iron deficiency anemia, unspecified: Secondary | ICD-10-CM | POA: Diagnosis not present

## 2023-09-23 DIAGNOSIS — N2581 Secondary hyperparathyroidism of renal origin: Secondary | ICD-10-CM | POA: Diagnosis not present

## 2023-09-23 DIAGNOSIS — Z992 Dependence on renal dialysis: Secondary | ICD-10-CM | POA: Diagnosis not present

## 2023-09-23 DIAGNOSIS — N186 End stage renal disease: Secondary | ICD-10-CM | POA: Diagnosis not present

## 2023-09-23 DIAGNOSIS — E1122 Type 2 diabetes mellitus with diabetic chronic kidney disease: Secondary | ICD-10-CM | POA: Diagnosis not present

## 2023-09-23 DIAGNOSIS — D631 Anemia in chronic kidney disease: Secondary | ICD-10-CM | POA: Diagnosis not present

## 2023-09-23 DIAGNOSIS — D509 Iron deficiency anemia, unspecified: Secondary | ICD-10-CM | POA: Diagnosis not present

## 2023-09-23 DIAGNOSIS — R52 Pain, unspecified: Secondary | ICD-10-CM | POA: Diagnosis not present

## 2023-09-23 DIAGNOSIS — D689 Coagulation defect, unspecified: Secondary | ICD-10-CM | POA: Diagnosis not present

## 2023-09-25 DIAGNOSIS — N2581 Secondary hyperparathyroidism of renal origin: Secondary | ICD-10-CM | POA: Diagnosis not present

## 2023-09-25 DIAGNOSIS — E1122 Type 2 diabetes mellitus with diabetic chronic kidney disease: Secondary | ICD-10-CM | POA: Diagnosis not present

## 2023-09-25 DIAGNOSIS — D509 Iron deficiency anemia, unspecified: Secondary | ICD-10-CM | POA: Diagnosis not present

## 2023-09-25 DIAGNOSIS — Z992 Dependence on renal dialysis: Secondary | ICD-10-CM | POA: Diagnosis not present

## 2023-09-25 DIAGNOSIS — N186 End stage renal disease: Secondary | ICD-10-CM | POA: Diagnosis not present

## 2023-09-25 DIAGNOSIS — D631 Anemia in chronic kidney disease: Secondary | ICD-10-CM | POA: Diagnosis not present

## 2023-09-25 DIAGNOSIS — R52 Pain, unspecified: Secondary | ICD-10-CM | POA: Diagnosis not present

## 2023-09-25 DIAGNOSIS — D689 Coagulation defect, unspecified: Secondary | ICD-10-CM | POA: Diagnosis not present

## 2023-09-28 DIAGNOSIS — N186 End stage renal disease: Secondary | ICD-10-CM | POA: Diagnosis not present

## 2023-09-28 DIAGNOSIS — R52 Pain, unspecified: Secondary | ICD-10-CM | POA: Diagnosis not present

## 2023-09-28 DIAGNOSIS — Z992 Dependence on renal dialysis: Secondary | ICD-10-CM | POA: Diagnosis not present

## 2023-09-28 DIAGNOSIS — D509 Iron deficiency anemia, unspecified: Secondary | ICD-10-CM | POA: Diagnosis not present

## 2023-09-28 DIAGNOSIS — E1122 Type 2 diabetes mellitus with diabetic chronic kidney disease: Secondary | ICD-10-CM | POA: Diagnosis not present

## 2023-09-28 DIAGNOSIS — D689 Coagulation defect, unspecified: Secondary | ICD-10-CM | POA: Diagnosis not present

## 2023-09-28 DIAGNOSIS — N2581 Secondary hyperparathyroidism of renal origin: Secondary | ICD-10-CM | POA: Diagnosis not present

## 2023-09-28 DIAGNOSIS — D631 Anemia in chronic kidney disease: Secondary | ICD-10-CM | POA: Diagnosis not present

## 2023-09-30 DIAGNOSIS — D509 Iron deficiency anemia, unspecified: Secondary | ICD-10-CM | POA: Diagnosis not present

## 2023-09-30 DIAGNOSIS — D689 Coagulation defect, unspecified: Secondary | ICD-10-CM | POA: Diagnosis not present

## 2023-09-30 DIAGNOSIS — N2581 Secondary hyperparathyroidism of renal origin: Secondary | ICD-10-CM | POA: Diagnosis not present

## 2023-09-30 DIAGNOSIS — R52 Pain, unspecified: Secondary | ICD-10-CM | POA: Diagnosis not present

## 2023-09-30 DIAGNOSIS — E1122 Type 2 diabetes mellitus with diabetic chronic kidney disease: Secondary | ICD-10-CM | POA: Diagnosis not present

## 2023-09-30 DIAGNOSIS — I129 Hypertensive chronic kidney disease with stage 1 through stage 4 chronic kidney disease, or unspecified chronic kidney disease: Secondary | ICD-10-CM | POA: Diagnosis not present

## 2023-09-30 DIAGNOSIS — N186 End stage renal disease: Secondary | ICD-10-CM | POA: Diagnosis not present

## 2023-09-30 DIAGNOSIS — D631 Anemia in chronic kidney disease: Secondary | ICD-10-CM | POA: Diagnosis not present

## 2023-09-30 DIAGNOSIS — Z992 Dependence on renal dialysis: Secondary | ICD-10-CM | POA: Diagnosis not present

## 2023-10-02 DIAGNOSIS — Z992 Dependence on renal dialysis: Secondary | ICD-10-CM | POA: Diagnosis not present

## 2023-10-02 DIAGNOSIS — D689 Coagulation defect, unspecified: Secondary | ICD-10-CM | POA: Diagnosis not present

## 2023-10-02 DIAGNOSIS — N186 End stage renal disease: Secondary | ICD-10-CM | POA: Diagnosis not present

## 2023-10-02 DIAGNOSIS — N2581 Secondary hyperparathyroidism of renal origin: Secondary | ICD-10-CM | POA: Diagnosis not present

## 2023-10-05 DIAGNOSIS — D689 Coagulation defect, unspecified: Secondary | ICD-10-CM | POA: Diagnosis not present

## 2023-10-05 DIAGNOSIS — N2581 Secondary hyperparathyroidism of renal origin: Secondary | ICD-10-CM | POA: Diagnosis not present

## 2023-10-05 DIAGNOSIS — Z992 Dependence on renal dialysis: Secondary | ICD-10-CM | POA: Diagnosis not present

## 2023-10-05 DIAGNOSIS — N186 End stage renal disease: Secondary | ICD-10-CM | POA: Diagnosis not present

## 2023-10-07 DIAGNOSIS — N2581 Secondary hyperparathyroidism of renal origin: Secondary | ICD-10-CM | POA: Diagnosis not present

## 2023-10-07 DIAGNOSIS — D689 Coagulation defect, unspecified: Secondary | ICD-10-CM | POA: Diagnosis not present

## 2023-10-07 DIAGNOSIS — N186 End stage renal disease: Secondary | ICD-10-CM | POA: Diagnosis not present

## 2023-10-07 DIAGNOSIS — Z992 Dependence on renal dialysis: Secondary | ICD-10-CM | POA: Diagnosis not present

## 2023-10-09 DIAGNOSIS — Z992 Dependence on renal dialysis: Secondary | ICD-10-CM | POA: Diagnosis not present

## 2023-10-09 DIAGNOSIS — D689 Coagulation defect, unspecified: Secondary | ICD-10-CM | POA: Diagnosis not present

## 2023-10-09 DIAGNOSIS — N2581 Secondary hyperparathyroidism of renal origin: Secondary | ICD-10-CM | POA: Diagnosis not present

## 2023-10-09 DIAGNOSIS — N186 End stage renal disease: Secondary | ICD-10-CM | POA: Diagnosis not present

## 2023-10-12 DIAGNOSIS — D689 Coagulation defect, unspecified: Secondary | ICD-10-CM | POA: Diagnosis not present

## 2023-10-12 DIAGNOSIS — N186 End stage renal disease: Secondary | ICD-10-CM | POA: Diagnosis not present

## 2023-10-12 DIAGNOSIS — Z992 Dependence on renal dialysis: Secondary | ICD-10-CM | POA: Diagnosis not present

## 2023-10-12 DIAGNOSIS — N2581 Secondary hyperparathyroidism of renal origin: Secondary | ICD-10-CM | POA: Diagnosis not present

## 2023-10-14 DIAGNOSIS — D689 Coagulation defect, unspecified: Secondary | ICD-10-CM | POA: Diagnosis not present

## 2023-10-14 DIAGNOSIS — N2581 Secondary hyperparathyroidism of renal origin: Secondary | ICD-10-CM | POA: Diagnosis not present

## 2023-10-14 DIAGNOSIS — N186 End stage renal disease: Secondary | ICD-10-CM | POA: Diagnosis not present

## 2023-10-14 DIAGNOSIS — Z992 Dependence on renal dialysis: Secondary | ICD-10-CM | POA: Diagnosis not present

## 2023-10-16 DIAGNOSIS — D689 Coagulation defect, unspecified: Secondary | ICD-10-CM | POA: Diagnosis not present

## 2023-10-16 DIAGNOSIS — N186 End stage renal disease: Secondary | ICD-10-CM | POA: Diagnosis not present

## 2023-10-16 DIAGNOSIS — N2581 Secondary hyperparathyroidism of renal origin: Secondary | ICD-10-CM | POA: Diagnosis not present

## 2023-10-16 DIAGNOSIS — Z992 Dependence on renal dialysis: Secondary | ICD-10-CM | POA: Diagnosis not present

## 2023-10-19 DIAGNOSIS — Z992 Dependence on renal dialysis: Secondary | ICD-10-CM | POA: Diagnosis not present

## 2023-10-19 DIAGNOSIS — D689 Coagulation defect, unspecified: Secondary | ICD-10-CM | POA: Diagnosis not present

## 2023-10-19 DIAGNOSIS — N2581 Secondary hyperparathyroidism of renal origin: Secondary | ICD-10-CM | POA: Diagnosis not present

## 2023-10-19 DIAGNOSIS — N186 End stage renal disease: Secondary | ICD-10-CM | POA: Diagnosis not present

## 2023-10-21 DIAGNOSIS — D689 Coagulation defect, unspecified: Secondary | ICD-10-CM | POA: Diagnosis not present

## 2023-10-21 DIAGNOSIS — N186 End stage renal disease: Secondary | ICD-10-CM | POA: Diagnosis not present

## 2023-10-21 DIAGNOSIS — N2581 Secondary hyperparathyroidism of renal origin: Secondary | ICD-10-CM | POA: Diagnosis not present

## 2023-10-21 DIAGNOSIS — Z992 Dependence on renal dialysis: Secondary | ICD-10-CM | POA: Diagnosis not present

## 2023-10-23 DIAGNOSIS — N2581 Secondary hyperparathyroidism of renal origin: Secondary | ICD-10-CM | POA: Diagnosis not present

## 2023-10-23 DIAGNOSIS — D689 Coagulation defect, unspecified: Secondary | ICD-10-CM | POA: Diagnosis not present

## 2023-10-23 DIAGNOSIS — N186 End stage renal disease: Secondary | ICD-10-CM | POA: Diagnosis not present

## 2023-10-23 DIAGNOSIS — Z992 Dependence on renal dialysis: Secondary | ICD-10-CM | POA: Diagnosis not present

## 2023-10-26 DIAGNOSIS — N186 End stage renal disease: Secondary | ICD-10-CM | POA: Diagnosis not present

## 2023-10-26 DIAGNOSIS — D689 Coagulation defect, unspecified: Secondary | ICD-10-CM | POA: Diagnosis not present

## 2023-10-26 DIAGNOSIS — Z992 Dependence on renal dialysis: Secondary | ICD-10-CM | POA: Diagnosis not present

## 2023-10-26 DIAGNOSIS — N2581 Secondary hyperparathyroidism of renal origin: Secondary | ICD-10-CM | POA: Diagnosis not present

## 2023-10-28 DIAGNOSIS — N186 End stage renal disease: Secondary | ICD-10-CM | POA: Diagnosis not present

## 2023-10-28 DIAGNOSIS — Z992 Dependence on renal dialysis: Secondary | ICD-10-CM | POA: Diagnosis not present

## 2023-10-28 DIAGNOSIS — N2581 Secondary hyperparathyroidism of renal origin: Secondary | ICD-10-CM | POA: Diagnosis not present

## 2023-10-28 DIAGNOSIS — D689 Coagulation defect, unspecified: Secondary | ICD-10-CM | POA: Diagnosis not present

## 2023-10-30 DIAGNOSIS — N186 End stage renal disease: Secondary | ICD-10-CM | POA: Diagnosis not present

## 2023-10-30 DIAGNOSIS — Z992 Dependence on renal dialysis: Secondary | ICD-10-CM | POA: Diagnosis not present

## 2023-10-30 DIAGNOSIS — N2581 Secondary hyperparathyroidism of renal origin: Secondary | ICD-10-CM | POA: Diagnosis not present

## 2023-10-30 DIAGNOSIS — D689 Coagulation defect, unspecified: Secondary | ICD-10-CM | POA: Diagnosis not present

## 2023-10-31 DIAGNOSIS — N186 End stage renal disease: Secondary | ICD-10-CM | POA: Diagnosis not present

## 2023-10-31 DIAGNOSIS — Z992 Dependence on renal dialysis: Secondary | ICD-10-CM | POA: Diagnosis not present

## 2023-10-31 DIAGNOSIS — I129 Hypertensive chronic kidney disease with stage 1 through stage 4 chronic kidney disease, or unspecified chronic kidney disease: Secondary | ICD-10-CM | POA: Diagnosis not present

## 2023-11-02 DIAGNOSIS — R52 Pain, unspecified: Secondary | ICD-10-CM | POA: Diagnosis not present

## 2023-11-02 DIAGNOSIS — D689 Coagulation defect, unspecified: Secondary | ICD-10-CM | POA: Diagnosis not present

## 2023-11-02 DIAGNOSIS — N186 End stage renal disease: Secondary | ICD-10-CM | POA: Diagnosis not present

## 2023-11-02 DIAGNOSIS — N2581 Secondary hyperparathyroidism of renal origin: Secondary | ICD-10-CM | POA: Diagnosis not present

## 2023-11-02 DIAGNOSIS — Z992 Dependence on renal dialysis: Secondary | ICD-10-CM | POA: Diagnosis not present

## 2023-11-04 DIAGNOSIS — N186 End stage renal disease: Secondary | ICD-10-CM | POA: Diagnosis not present

## 2023-11-04 DIAGNOSIS — N2581 Secondary hyperparathyroidism of renal origin: Secondary | ICD-10-CM | POA: Diagnosis not present

## 2023-11-04 DIAGNOSIS — R52 Pain, unspecified: Secondary | ICD-10-CM | POA: Diagnosis not present

## 2023-11-04 DIAGNOSIS — Z992 Dependence on renal dialysis: Secondary | ICD-10-CM | POA: Diagnosis not present

## 2023-11-04 DIAGNOSIS — D689 Coagulation defect, unspecified: Secondary | ICD-10-CM | POA: Diagnosis not present

## 2023-11-06 DIAGNOSIS — Z992 Dependence on renal dialysis: Secondary | ICD-10-CM | POA: Diagnosis not present

## 2023-11-06 DIAGNOSIS — N2581 Secondary hyperparathyroidism of renal origin: Secondary | ICD-10-CM | POA: Diagnosis not present

## 2023-11-06 DIAGNOSIS — N186 End stage renal disease: Secondary | ICD-10-CM | POA: Diagnosis not present

## 2023-11-06 DIAGNOSIS — R52 Pain, unspecified: Secondary | ICD-10-CM | POA: Diagnosis not present

## 2023-11-06 DIAGNOSIS — D689 Coagulation defect, unspecified: Secondary | ICD-10-CM | POA: Diagnosis not present

## 2023-11-09 DIAGNOSIS — R52 Pain, unspecified: Secondary | ICD-10-CM | POA: Diagnosis not present

## 2023-11-09 DIAGNOSIS — N2581 Secondary hyperparathyroidism of renal origin: Secondary | ICD-10-CM | POA: Diagnosis not present

## 2023-11-09 DIAGNOSIS — N186 End stage renal disease: Secondary | ICD-10-CM | POA: Diagnosis not present

## 2023-11-09 DIAGNOSIS — D689 Coagulation defect, unspecified: Secondary | ICD-10-CM | POA: Diagnosis not present

## 2023-11-09 DIAGNOSIS — Z992 Dependence on renal dialysis: Secondary | ICD-10-CM | POA: Diagnosis not present

## 2023-11-11 DIAGNOSIS — R52 Pain, unspecified: Secondary | ICD-10-CM | POA: Diagnosis not present

## 2023-11-11 DIAGNOSIS — N2581 Secondary hyperparathyroidism of renal origin: Secondary | ICD-10-CM | POA: Diagnosis not present

## 2023-11-11 DIAGNOSIS — D689 Coagulation defect, unspecified: Secondary | ICD-10-CM | POA: Diagnosis not present

## 2023-11-11 DIAGNOSIS — N186 End stage renal disease: Secondary | ICD-10-CM | POA: Diagnosis not present

## 2023-11-11 DIAGNOSIS — Z992 Dependence on renal dialysis: Secondary | ICD-10-CM | POA: Diagnosis not present

## 2023-11-13 DIAGNOSIS — N186 End stage renal disease: Secondary | ICD-10-CM | POA: Diagnosis not present

## 2023-11-13 DIAGNOSIS — N2581 Secondary hyperparathyroidism of renal origin: Secondary | ICD-10-CM | POA: Diagnosis not present

## 2023-11-13 DIAGNOSIS — D689 Coagulation defect, unspecified: Secondary | ICD-10-CM | POA: Diagnosis not present

## 2023-11-13 DIAGNOSIS — R52 Pain, unspecified: Secondary | ICD-10-CM | POA: Diagnosis not present

## 2023-11-13 DIAGNOSIS — Z992 Dependence on renal dialysis: Secondary | ICD-10-CM | POA: Diagnosis not present

## 2023-11-16 DIAGNOSIS — N186 End stage renal disease: Secondary | ICD-10-CM | POA: Diagnosis not present

## 2023-11-16 DIAGNOSIS — N2581 Secondary hyperparathyroidism of renal origin: Secondary | ICD-10-CM | POA: Diagnosis not present

## 2023-11-16 DIAGNOSIS — Z992 Dependence on renal dialysis: Secondary | ICD-10-CM | POA: Diagnosis not present

## 2023-11-16 DIAGNOSIS — R52 Pain, unspecified: Secondary | ICD-10-CM | POA: Diagnosis not present

## 2023-11-16 DIAGNOSIS — D689 Coagulation defect, unspecified: Secondary | ICD-10-CM | POA: Diagnosis not present

## 2023-11-18 DIAGNOSIS — D689 Coagulation defect, unspecified: Secondary | ICD-10-CM | POA: Diagnosis not present

## 2023-11-18 DIAGNOSIS — R52 Pain, unspecified: Secondary | ICD-10-CM | POA: Diagnosis not present

## 2023-11-18 DIAGNOSIS — Z992 Dependence on renal dialysis: Secondary | ICD-10-CM | POA: Diagnosis not present

## 2023-11-18 DIAGNOSIS — N186 End stage renal disease: Secondary | ICD-10-CM | POA: Diagnosis not present

## 2023-11-18 DIAGNOSIS — N2581 Secondary hyperparathyroidism of renal origin: Secondary | ICD-10-CM | POA: Diagnosis not present

## 2023-11-20 DIAGNOSIS — Z992 Dependence on renal dialysis: Secondary | ICD-10-CM | POA: Diagnosis not present

## 2023-11-20 DIAGNOSIS — D689 Coagulation defect, unspecified: Secondary | ICD-10-CM | POA: Diagnosis not present

## 2023-11-20 DIAGNOSIS — N2581 Secondary hyperparathyroidism of renal origin: Secondary | ICD-10-CM | POA: Diagnosis not present

## 2023-11-20 DIAGNOSIS — N186 End stage renal disease: Secondary | ICD-10-CM | POA: Diagnosis not present

## 2023-11-20 DIAGNOSIS — R52 Pain, unspecified: Secondary | ICD-10-CM | POA: Diagnosis not present

## 2023-11-23 DIAGNOSIS — N2581 Secondary hyperparathyroidism of renal origin: Secondary | ICD-10-CM | POA: Diagnosis not present

## 2023-11-23 DIAGNOSIS — Z992 Dependence on renal dialysis: Secondary | ICD-10-CM | POA: Diagnosis not present

## 2023-11-23 DIAGNOSIS — R52 Pain, unspecified: Secondary | ICD-10-CM | POA: Diagnosis not present

## 2023-11-23 DIAGNOSIS — N186 End stage renal disease: Secondary | ICD-10-CM | POA: Diagnosis not present

## 2023-11-23 DIAGNOSIS — D689 Coagulation defect, unspecified: Secondary | ICD-10-CM | POA: Diagnosis not present

## 2023-11-25 DIAGNOSIS — D689 Coagulation defect, unspecified: Secondary | ICD-10-CM | POA: Diagnosis not present

## 2023-11-25 DIAGNOSIS — N2581 Secondary hyperparathyroidism of renal origin: Secondary | ICD-10-CM | POA: Diagnosis not present

## 2023-11-25 DIAGNOSIS — N186 End stage renal disease: Secondary | ICD-10-CM | POA: Diagnosis not present

## 2023-11-25 DIAGNOSIS — R52 Pain, unspecified: Secondary | ICD-10-CM | POA: Diagnosis not present

## 2023-11-25 DIAGNOSIS — Z992 Dependence on renal dialysis: Secondary | ICD-10-CM | POA: Diagnosis not present

## 2023-11-27 DIAGNOSIS — N186 End stage renal disease: Secondary | ICD-10-CM | POA: Diagnosis not present

## 2023-11-27 DIAGNOSIS — N2581 Secondary hyperparathyroidism of renal origin: Secondary | ICD-10-CM | POA: Diagnosis not present

## 2023-11-27 DIAGNOSIS — Z992 Dependence on renal dialysis: Secondary | ICD-10-CM | POA: Diagnosis not present

## 2023-11-27 DIAGNOSIS — R52 Pain, unspecified: Secondary | ICD-10-CM | POA: Diagnosis not present

## 2023-11-27 DIAGNOSIS — D689 Coagulation defect, unspecified: Secondary | ICD-10-CM | POA: Diagnosis not present

## 2023-11-30 DIAGNOSIS — N2581 Secondary hyperparathyroidism of renal origin: Secondary | ICD-10-CM | POA: Diagnosis not present

## 2023-11-30 DIAGNOSIS — N186 End stage renal disease: Secondary | ICD-10-CM | POA: Diagnosis not present

## 2023-11-30 DIAGNOSIS — D689 Coagulation defect, unspecified: Secondary | ICD-10-CM | POA: Diagnosis not present

## 2023-11-30 DIAGNOSIS — Z992 Dependence on renal dialysis: Secondary | ICD-10-CM | POA: Diagnosis not present

## 2023-11-30 DIAGNOSIS — R52 Pain, unspecified: Secondary | ICD-10-CM | POA: Diagnosis not present

## 2023-11-30 DIAGNOSIS — I129 Hypertensive chronic kidney disease with stage 1 through stage 4 chronic kidney disease, or unspecified chronic kidney disease: Secondary | ICD-10-CM | POA: Diagnosis not present

## 2023-12-02 DIAGNOSIS — E1122 Type 2 diabetes mellitus with diabetic chronic kidney disease: Secondary | ICD-10-CM | POA: Diagnosis not present

## 2023-12-02 DIAGNOSIS — Z992 Dependence on renal dialysis: Secondary | ICD-10-CM | POA: Diagnosis not present

## 2023-12-02 DIAGNOSIS — D689 Coagulation defect, unspecified: Secondary | ICD-10-CM | POA: Diagnosis not present

## 2023-12-02 DIAGNOSIS — D509 Iron deficiency anemia, unspecified: Secondary | ICD-10-CM | POA: Diagnosis not present

## 2023-12-02 DIAGNOSIS — N186 End stage renal disease: Secondary | ICD-10-CM | POA: Diagnosis not present

## 2023-12-02 DIAGNOSIS — D631 Anemia in chronic kidney disease: Secondary | ICD-10-CM | POA: Diagnosis not present

## 2023-12-02 DIAGNOSIS — N2581 Secondary hyperparathyroidism of renal origin: Secondary | ICD-10-CM | POA: Diagnosis not present

## 2023-12-04 DIAGNOSIS — Z992 Dependence on renal dialysis: Secondary | ICD-10-CM | POA: Diagnosis not present

## 2023-12-04 DIAGNOSIS — N186 End stage renal disease: Secondary | ICD-10-CM | POA: Diagnosis not present

## 2023-12-04 DIAGNOSIS — D631 Anemia in chronic kidney disease: Secondary | ICD-10-CM | POA: Diagnosis not present

## 2023-12-04 DIAGNOSIS — D689 Coagulation defect, unspecified: Secondary | ICD-10-CM | POA: Diagnosis not present

## 2023-12-04 DIAGNOSIS — N2581 Secondary hyperparathyroidism of renal origin: Secondary | ICD-10-CM | POA: Diagnosis not present

## 2023-12-04 DIAGNOSIS — D509 Iron deficiency anemia, unspecified: Secondary | ICD-10-CM | POA: Diagnosis not present

## 2023-12-04 DIAGNOSIS — E1122 Type 2 diabetes mellitus with diabetic chronic kidney disease: Secondary | ICD-10-CM | POA: Diagnosis not present

## 2023-12-07 DIAGNOSIS — N186 End stage renal disease: Secondary | ICD-10-CM | POA: Diagnosis not present

## 2023-12-07 DIAGNOSIS — E1122 Type 2 diabetes mellitus with diabetic chronic kidney disease: Secondary | ICD-10-CM | POA: Diagnosis not present

## 2023-12-07 DIAGNOSIS — N2581 Secondary hyperparathyroidism of renal origin: Secondary | ICD-10-CM | POA: Diagnosis not present

## 2023-12-07 DIAGNOSIS — D631 Anemia in chronic kidney disease: Secondary | ICD-10-CM | POA: Diagnosis not present

## 2023-12-07 DIAGNOSIS — D509 Iron deficiency anemia, unspecified: Secondary | ICD-10-CM | POA: Diagnosis not present

## 2023-12-07 DIAGNOSIS — Z992 Dependence on renal dialysis: Secondary | ICD-10-CM | POA: Diagnosis not present

## 2023-12-07 DIAGNOSIS — D689 Coagulation defect, unspecified: Secondary | ICD-10-CM | POA: Diagnosis not present

## 2023-12-09 DIAGNOSIS — D689 Coagulation defect, unspecified: Secondary | ICD-10-CM | POA: Diagnosis not present

## 2023-12-09 DIAGNOSIS — E1122 Type 2 diabetes mellitus with diabetic chronic kidney disease: Secondary | ICD-10-CM | POA: Diagnosis not present

## 2023-12-09 DIAGNOSIS — D631 Anemia in chronic kidney disease: Secondary | ICD-10-CM | POA: Diagnosis not present

## 2023-12-09 DIAGNOSIS — D509 Iron deficiency anemia, unspecified: Secondary | ICD-10-CM | POA: Diagnosis not present

## 2023-12-09 DIAGNOSIS — N2581 Secondary hyperparathyroidism of renal origin: Secondary | ICD-10-CM | POA: Diagnosis not present

## 2023-12-09 DIAGNOSIS — N186 End stage renal disease: Secondary | ICD-10-CM | POA: Diagnosis not present

## 2023-12-09 DIAGNOSIS — Z992 Dependence on renal dialysis: Secondary | ICD-10-CM | POA: Diagnosis not present

## 2023-12-10 DIAGNOSIS — M25562 Pain in left knee: Secondary | ICD-10-CM | POA: Diagnosis not present

## 2023-12-10 DIAGNOSIS — M25561 Pain in right knee: Secondary | ICD-10-CM | POA: Diagnosis not present

## 2023-12-11 DIAGNOSIS — Z992 Dependence on renal dialysis: Secondary | ICD-10-CM | POA: Diagnosis not present

## 2023-12-11 DIAGNOSIS — E1122 Type 2 diabetes mellitus with diabetic chronic kidney disease: Secondary | ICD-10-CM | POA: Diagnosis not present

## 2023-12-11 DIAGNOSIS — N2581 Secondary hyperparathyroidism of renal origin: Secondary | ICD-10-CM | POA: Diagnosis not present

## 2023-12-11 DIAGNOSIS — D631 Anemia in chronic kidney disease: Secondary | ICD-10-CM | POA: Diagnosis not present

## 2023-12-11 DIAGNOSIS — N186 End stage renal disease: Secondary | ICD-10-CM | POA: Diagnosis not present

## 2023-12-11 DIAGNOSIS — D689 Coagulation defect, unspecified: Secondary | ICD-10-CM | POA: Diagnosis not present

## 2023-12-11 DIAGNOSIS — D509 Iron deficiency anemia, unspecified: Secondary | ICD-10-CM | POA: Diagnosis not present

## 2023-12-14 DIAGNOSIS — N186 End stage renal disease: Secondary | ICD-10-CM | POA: Diagnosis not present

## 2023-12-14 DIAGNOSIS — D509 Iron deficiency anemia, unspecified: Secondary | ICD-10-CM | POA: Diagnosis not present

## 2023-12-14 DIAGNOSIS — D689 Coagulation defect, unspecified: Secondary | ICD-10-CM | POA: Diagnosis not present

## 2023-12-14 DIAGNOSIS — D631 Anemia in chronic kidney disease: Secondary | ICD-10-CM | POA: Diagnosis not present

## 2023-12-14 DIAGNOSIS — N2581 Secondary hyperparathyroidism of renal origin: Secondary | ICD-10-CM | POA: Diagnosis not present

## 2023-12-14 DIAGNOSIS — E1122 Type 2 diabetes mellitus with diabetic chronic kidney disease: Secondary | ICD-10-CM | POA: Diagnosis not present

## 2023-12-14 DIAGNOSIS — Z992 Dependence on renal dialysis: Secondary | ICD-10-CM | POA: Diagnosis not present

## 2023-12-16 DIAGNOSIS — Z992 Dependence on renal dialysis: Secondary | ICD-10-CM | POA: Diagnosis not present

## 2023-12-16 DIAGNOSIS — D509 Iron deficiency anemia, unspecified: Secondary | ICD-10-CM | POA: Diagnosis not present

## 2023-12-16 DIAGNOSIS — D689 Coagulation defect, unspecified: Secondary | ICD-10-CM | POA: Diagnosis not present

## 2023-12-16 DIAGNOSIS — N186 End stage renal disease: Secondary | ICD-10-CM | POA: Diagnosis not present

## 2023-12-16 DIAGNOSIS — D631 Anemia in chronic kidney disease: Secondary | ICD-10-CM | POA: Diagnosis not present

## 2023-12-16 DIAGNOSIS — E1122 Type 2 diabetes mellitus with diabetic chronic kidney disease: Secondary | ICD-10-CM | POA: Diagnosis not present

## 2023-12-16 DIAGNOSIS — N2581 Secondary hyperparathyroidism of renal origin: Secondary | ICD-10-CM | POA: Diagnosis not present

## 2023-12-18 DIAGNOSIS — Z992 Dependence on renal dialysis: Secondary | ICD-10-CM | POA: Diagnosis not present

## 2023-12-18 DIAGNOSIS — E1122 Type 2 diabetes mellitus with diabetic chronic kidney disease: Secondary | ICD-10-CM | POA: Diagnosis not present

## 2023-12-18 DIAGNOSIS — D509 Iron deficiency anemia, unspecified: Secondary | ICD-10-CM | POA: Diagnosis not present

## 2023-12-18 DIAGNOSIS — N186 End stage renal disease: Secondary | ICD-10-CM | POA: Diagnosis not present

## 2023-12-18 DIAGNOSIS — D689 Coagulation defect, unspecified: Secondary | ICD-10-CM | POA: Diagnosis not present

## 2023-12-18 DIAGNOSIS — N2581 Secondary hyperparathyroidism of renal origin: Secondary | ICD-10-CM | POA: Diagnosis not present

## 2023-12-18 DIAGNOSIS — D631 Anemia in chronic kidney disease: Secondary | ICD-10-CM | POA: Diagnosis not present

## 2023-12-21 DIAGNOSIS — D689 Coagulation defect, unspecified: Secondary | ICD-10-CM | POA: Diagnosis not present

## 2023-12-21 DIAGNOSIS — E1122 Type 2 diabetes mellitus with diabetic chronic kidney disease: Secondary | ICD-10-CM | POA: Diagnosis not present

## 2023-12-21 DIAGNOSIS — Z992 Dependence on renal dialysis: Secondary | ICD-10-CM | POA: Diagnosis not present

## 2023-12-21 DIAGNOSIS — D509 Iron deficiency anemia, unspecified: Secondary | ICD-10-CM | POA: Diagnosis not present

## 2023-12-21 DIAGNOSIS — N2581 Secondary hyperparathyroidism of renal origin: Secondary | ICD-10-CM | POA: Diagnosis not present

## 2023-12-21 DIAGNOSIS — N186 End stage renal disease: Secondary | ICD-10-CM | POA: Diagnosis not present

## 2023-12-21 DIAGNOSIS — D631 Anemia in chronic kidney disease: Secondary | ICD-10-CM | POA: Diagnosis not present

## 2023-12-23 DIAGNOSIS — D509 Iron deficiency anemia, unspecified: Secondary | ICD-10-CM | POA: Diagnosis not present

## 2023-12-23 DIAGNOSIS — D631 Anemia in chronic kidney disease: Secondary | ICD-10-CM | POA: Diagnosis not present

## 2023-12-23 DIAGNOSIS — Z992 Dependence on renal dialysis: Secondary | ICD-10-CM | POA: Diagnosis not present

## 2023-12-23 DIAGNOSIS — N186 End stage renal disease: Secondary | ICD-10-CM | POA: Diagnosis not present

## 2023-12-23 DIAGNOSIS — N2581 Secondary hyperparathyroidism of renal origin: Secondary | ICD-10-CM | POA: Diagnosis not present

## 2023-12-23 DIAGNOSIS — D689 Coagulation defect, unspecified: Secondary | ICD-10-CM | POA: Diagnosis not present

## 2023-12-23 DIAGNOSIS — E1122 Type 2 diabetes mellitus with diabetic chronic kidney disease: Secondary | ICD-10-CM | POA: Diagnosis not present

## 2023-12-25 DIAGNOSIS — D509 Iron deficiency anemia, unspecified: Secondary | ICD-10-CM | POA: Diagnosis not present

## 2023-12-25 DIAGNOSIS — N2581 Secondary hyperparathyroidism of renal origin: Secondary | ICD-10-CM | POA: Diagnosis not present

## 2023-12-25 DIAGNOSIS — E1122 Type 2 diabetes mellitus with diabetic chronic kidney disease: Secondary | ICD-10-CM | POA: Diagnosis not present

## 2023-12-25 DIAGNOSIS — D631 Anemia in chronic kidney disease: Secondary | ICD-10-CM | POA: Diagnosis not present

## 2023-12-25 DIAGNOSIS — D689 Coagulation defect, unspecified: Secondary | ICD-10-CM | POA: Diagnosis not present

## 2023-12-25 DIAGNOSIS — N186 End stage renal disease: Secondary | ICD-10-CM | POA: Diagnosis not present

## 2023-12-25 DIAGNOSIS — Z992 Dependence on renal dialysis: Secondary | ICD-10-CM | POA: Diagnosis not present

## 2023-12-28 DIAGNOSIS — Z992 Dependence on renal dialysis: Secondary | ICD-10-CM | POA: Diagnosis not present

## 2023-12-28 DIAGNOSIS — D689 Coagulation defect, unspecified: Secondary | ICD-10-CM | POA: Diagnosis not present

## 2023-12-28 DIAGNOSIS — D631 Anemia in chronic kidney disease: Secondary | ICD-10-CM | POA: Diagnosis not present

## 2023-12-28 DIAGNOSIS — D509 Iron deficiency anemia, unspecified: Secondary | ICD-10-CM | POA: Diagnosis not present

## 2023-12-28 DIAGNOSIS — N2581 Secondary hyperparathyroidism of renal origin: Secondary | ICD-10-CM | POA: Diagnosis not present

## 2023-12-28 DIAGNOSIS — N186 End stage renal disease: Secondary | ICD-10-CM | POA: Diagnosis not present

## 2023-12-28 DIAGNOSIS — E1122 Type 2 diabetes mellitus with diabetic chronic kidney disease: Secondary | ICD-10-CM | POA: Diagnosis not present

## 2023-12-30 DIAGNOSIS — D509 Iron deficiency anemia, unspecified: Secondary | ICD-10-CM | POA: Diagnosis not present

## 2023-12-30 DIAGNOSIS — D631 Anemia in chronic kidney disease: Secondary | ICD-10-CM | POA: Diagnosis not present

## 2023-12-30 DIAGNOSIS — E1122 Type 2 diabetes mellitus with diabetic chronic kidney disease: Secondary | ICD-10-CM | POA: Diagnosis not present

## 2023-12-30 DIAGNOSIS — N2581 Secondary hyperparathyroidism of renal origin: Secondary | ICD-10-CM | POA: Diagnosis not present

## 2023-12-30 DIAGNOSIS — D689 Coagulation defect, unspecified: Secondary | ICD-10-CM | POA: Diagnosis not present

## 2023-12-30 DIAGNOSIS — N186 End stage renal disease: Secondary | ICD-10-CM | POA: Diagnosis not present

## 2023-12-30 DIAGNOSIS — Z992 Dependence on renal dialysis: Secondary | ICD-10-CM | POA: Diagnosis not present

## 2023-12-31 DIAGNOSIS — N186 End stage renal disease: Secondary | ICD-10-CM | POA: Diagnosis not present

## 2023-12-31 DIAGNOSIS — I129 Hypertensive chronic kidney disease with stage 1 through stage 4 chronic kidney disease, or unspecified chronic kidney disease: Secondary | ICD-10-CM | POA: Diagnosis not present

## 2023-12-31 DIAGNOSIS — Z992 Dependence on renal dialysis: Secondary | ICD-10-CM | POA: Diagnosis not present

## 2024-01-01 DIAGNOSIS — R52 Pain, unspecified: Secondary | ICD-10-CM | POA: Diagnosis not present

## 2024-01-01 DIAGNOSIS — N2581 Secondary hyperparathyroidism of renal origin: Secondary | ICD-10-CM | POA: Diagnosis not present

## 2024-01-01 DIAGNOSIS — N186 End stage renal disease: Secondary | ICD-10-CM | POA: Diagnosis not present

## 2024-01-01 DIAGNOSIS — Z992 Dependence on renal dialysis: Secondary | ICD-10-CM | POA: Diagnosis not present

## 2024-01-01 DIAGNOSIS — D689 Coagulation defect, unspecified: Secondary | ICD-10-CM | POA: Diagnosis not present

## 2024-01-04 DIAGNOSIS — R52 Pain, unspecified: Secondary | ICD-10-CM | POA: Diagnosis not present

## 2024-01-04 DIAGNOSIS — Z992 Dependence on renal dialysis: Secondary | ICD-10-CM | POA: Diagnosis not present

## 2024-01-04 DIAGNOSIS — N186 End stage renal disease: Secondary | ICD-10-CM | POA: Diagnosis not present

## 2024-01-04 DIAGNOSIS — D689 Coagulation defect, unspecified: Secondary | ICD-10-CM | POA: Diagnosis not present

## 2024-01-04 DIAGNOSIS — N2581 Secondary hyperparathyroidism of renal origin: Secondary | ICD-10-CM | POA: Diagnosis not present

## 2024-01-05 ENCOUNTER — Ambulatory Visit: Admitting: Podiatry

## 2024-01-06 DIAGNOSIS — N2581 Secondary hyperparathyroidism of renal origin: Secondary | ICD-10-CM | POA: Diagnosis not present

## 2024-01-06 DIAGNOSIS — D689 Coagulation defect, unspecified: Secondary | ICD-10-CM | POA: Diagnosis not present

## 2024-01-06 DIAGNOSIS — Z992 Dependence on renal dialysis: Secondary | ICD-10-CM | POA: Diagnosis not present

## 2024-01-06 DIAGNOSIS — N186 End stage renal disease: Secondary | ICD-10-CM | POA: Diagnosis not present

## 2024-01-06 DIAGNOSIS — R52 Pain, unspecified: Secondary | ICD-10-CM | POA: Diagnosis not present

## 2024-01-07 DIAGNOSIS — Z794 Long term (current) use of insulin: Secondary | ICD-10-CM | POA: Diagnosis not present

## 2024-01-07 DIAGNOSIS — E1122 Type 2 diabetes mellitus with diabetic chronic kidney disease: Secondary | ICD-10-CM | POA: Diagnosis not present

## 2024-01-07 DIAGNOSIS — N186 End stage renal disease: Secondary | ICD-10-CM | POA: Diagnosis not present

## 2024-01-07 DIAGNOSIS — I10 Essential (primary) hypertension: Secondary | ICD-10-CM | POA: Diagnosis not present

## 2024-01-07 DIAGNOSIS — R748 Abnormal levels of other serum enzymes: Secondary | ICD-10-CM | POA: Diagnosis not present

## 2024-01-07 DIAGNOSIS — L89151 Pressure ulcer of sacral region, stage 1: Secondary | ICD-10-CM | POA: Diagnosis not present

## 2024-01-07 DIAGNOSIS — E7800 Pure hypercholesterolemia, unspecified: Secondary | ICD-10-CM | POA: Diagnosis not present

## 2024-01-08 DIAGNOSIS — N2581 Secondary hyperparathyroidism of renal origin: Secondary | ICD-10-CM | POA: Diagnosis not present

## 2024-01-08 DIAGNOSIS — N186 End stage renal disease: Secondary | ICD-10-CM | POA: Diagnosis not present

## 2024-01-08 DIAGNOSIS — D689 Coagulation defect, unspecified: Secondary | ICD-10-CM | POA: Diagnosis not present

## 2024-01-08 DIAGNOSIS — Z992 Dependence on renal dialysis: Secondary | ICD-10-CM | POA: Diagnosis not present

## 2024-01-08 DIAGNOSIS — R52 Pain, unspecified: Secondary | ICD-10-CM | POA: Diagnosis not present

## 2024-01-11 DIAGNOSIS — Z992 Dependence on renal dialysis: Secondary | ICD-10-CM | POA: Diagnosis not present

## 2024-01-11 DIAGNOSIS — R52 Pain, unspecified: Secondary | ICD-10-CM | POA: Diagnosis not present

## 2024-01-11 DIAGNOSIS — D689 Coagulation defect, unspecified: Secondary | ICD-10-CM | POA: Diagnosis not present

## 2024-01-11 DIAGNOSIS — N186 End stage renal disease: Secondary | ICD-10-CM | POA: Diagnosis not present

## 2024-01-11 DIAGNOSIS — N2581 Secondary hyperparathyroidism of renal origin: Secondary | ICD-10-CM | POA: Diagnosis not present

## 2024-01-13 DIAGNOSIS — N2581 Secondary hyperparathyroidism of renal origin: Secondary | ICD-10-CM | POA: Diagnosis not present

## 2024-01-13 DIAGNOSIS — Z992 Dependence on renal dialysis: Secondary | ICD-10-CM | POA: Diagnosis not present

## 2024-01-13 DIAGNOSIS — N186 End stage renal disease: Secondary | ICD-10-CM | POA: Diagnosis not present

## 2024-01-13 DIAGNOSIS — R52 Pain, unspecified: Secondary | ICD-10-CM | POA: Diagnosis not present

## 2024-01-13 DIAGNOSIS — D689 Coagulation defect, unspecified: Secondary | ICD-10-CM | POA: Diagnosis not present

## 2024-01-15 DIAGNOSIS — D689 Coagulation defect, unspecified: Secondary | ICD-10-CM | POA: Diagnosis not present

## 2024-01-15 DIAGNOSIS — N2581 Secondary hyperparathyroidism of renal origin: Secondary | ICD-10-CM | POA: Diagnosis not present

## 2024-01-15 DIAGNOSIS — N186 End stage renal disease: Secondary | ICD-10-CM | POA: Diagnosis not present

## 2024-01-15 DIAGNOSIS — Z992 Dependence on renal dialysis: Secondary | ICD-10-CM | POA: Diagnosis not present

## 2024-01-15 DIAGNOSIS — R52 Pain, unspecified: Secondary | ICD-10-CM | POA: Diagnosis not present

## 2024-01-18 DIAGNOSIS — Z992 Dependence on renal dialysis: Secondary | ICD-10-CM | POA: Diagnosis not present

## 2024-01-18 DIAGNOSIS — N186 End stage renal disease: Secondary | ICD-10-CM | POA: Diagnosis not present

## 2024-01-18 DIAGNOSIS — R52 Pain, unspecified: Secondary | ICD-10-CM | POA: Diagnosis not present

## 2024-01-18 DIAGNOSIS — N2581 Secondary hyperparathyroidism of renal origin: Secondary | ICD-10-CM | POA: Diagnosis not present

## 2024-01-18 DIAGNOSIS — D689 Coagulation defect, unspecified: Secondary | ICD-10-CM | POA: Diagnosis not present

## 2024-01-20 DIAGNOSIS — N2581 Secondary hyperparathyroidism of renal origin: Secondary | ICD-10-CM | POA: Diagnosis not present

## 2024-01-20 DIAGNOSIS — Z992 Dependence on renal dialysis: Secondary | ICD-10-CM | POA: Diagnosis not present

## 2024-01-20 DIAGNOSIS — D689 Coagulation defect, unspecified: Secondary | ICD-10-CM | POA: Diagnosis not present

## 2024-01-20 DIAGNOSIS — R52 Pain, unspecified: Secondary | ICD-10-CM | POA: Diagnosis not present

## 2024-01-20 DIAGNOSIS — N186 End stage renal disease: Secondary | ICD-10-CM | POA: Diagnosis not present

## 2024-01-22 DIAGNOSIS — R52 Pain, unspecified: Secondary | ICD-10-CM | POA: Diagnosis not present

## 2024-01-22 DIAGNOSIS — D689 Coagulation defect, unspecified: Secondary | ICD-10-CM | POA: Diagnosis not present

## 2024-01-22 DIAGNOSIS — Z992 Dependence on renal dialysis: Secondary | ICD-10-CM | POA: Diagnosis not present

## 2024-01-22 DIAGNOSIS — N2581 Secondary hyperparathyroidism of renal origin: Secondary | ICD-10-CM | POA: Diagnosis not present

## 2024-01-22 DIAGNOSIS — N186 End stage renal disease: Secondary | ICD-10-CM | POA: Diagnosis not present

## 2024-01-25 DIAGNOSIS — Z992 Dependence on renal dialysis: Secondary | ICD-10-CM | POA: Diagnosis not present

## 2024-01-25 DIAGNOSIS — N2581 Secondary hyperparathyroidism of renal origin: Secondary | ICD-10-CM | POA: Diagnosis not present

## 2024-01-25 DIAGNOSIS — N186 End stage renal disease: Secondary | ICD-10-CM | POA: Diagnosis not present

## 2024-01-25 DIAGNOSIS — D689 Coagulation defect, unspecified: Secondary | ICD-10-CM | POA: Diagnosis not present

## 2024-01-25 DIAGNOSIS — R52 Pain, unspecified: Secondary | ICD-10-CM | POA: Diagnosis not present

## 2024-01-27 DIAGNOSIS — R52 Pain, unspecified: Secondary | ICD-10-CM | POA: Diagnosis not present

## 2024-01-27 DIAGNOSIS — N186 End stage renal disease: Secondary | ICD-10-CM | POA: Diagnosis not present

## 2024-01-27 DIAGNOSIS — Z992 Dependence on renal dialysis: Secondary | ICD-10-CM | POA: Diagnosis not present

## 2024-01-27 DIAGNOSIS — N2581 Secondary hyperparathyroidism of renal origin: Secondary | ICD-10-CM | POA: Diagnosis not present

## 2024-01-27 DIAGNOSIS — D689 Coagulation defect, unspecified: Secondary | ICD-10-CM | POA: Diagnosis not present

## 2024-01-29 DIAGNOSIS — D689 Coagulation defect, unspecified: Secondary | ICD-10-CM | POA: Diagnosis not present

## 2024-01-29 DIAGNOSIS — R52 Pain, unspecified: Secondary | ICD-10-CM | POA: Diagnosis not present

## 2024-01-29 DIAGNOSIS — N186 End stage renal disease: Secondary | ICD-10-CM | POA: Diagnosis not present

## 2024-01-29 DIAGNOSIS — Z992 Dependence on renal dialysis: Secondary | ICD-10-CM | POA: Diagnosis not present

## 2024-01-29 DIAGNOSIS — N2581 Secondary hyperparathyroidism of renal origin: Secondary | ICD-10-CM | POA: Diagnosis not present

## 2024-01-31 DIAGNOSIS — I129 Hypertensive chronic kidney disease with stage 1 through stage 4 chronic kidney disease, or unspecified chronic kidney disease: Secondary | ICD-10-CM | POA: Diagnosis not present

## 2024-01-31 DIAGNOSIS — Z992 Dependence on renal dialysis: Secondary | ICD-10-CM | POA: Diagnosis not present

## 2024-01-31 DIAGNOSIS — N186 End stage renal disease: Secondary | ICD-10-CM | POA: Diagnosis not present

## 2024-02-01 DIAGNOSIS — N2581 Secondary hyperparathyroidism of renal origin: Secondary | ICD-10-CM | POA: Diagnosis not present

## 2024-02-01 DIAGNOSIS — Z992 Dependence on renal dialysis: Secondary | ICD-10-CM | POA: Diagnosis not present

## 2024-02-01 DIAGNOSIS — N186 End stage renal disease: Secondary | ICD-10-CM | POA: Diagnosis not present

## 2024-02-01 DIAGNOSIS — D689 Coagulation defect, unspecified: Secondary | ICD-10-CM | POA: Diagnosis not present

## 2024-02-03 DIAGNOSIS — D689 Coagulation defect, unspecified: Secondary | ICD-10-CM | POA: Diagnosis not present

## 2024-02-03 DIAGNOSIS — N2581 Secondary hyperparathyroidism of renal origin: Secondary | ICD-10-CM | POA: Diagnosis not present

## 2024-02-03 DIAGNOSIS — Z992 Dependence on renal dialysis: Secondary | ICD-10-CM | POA: Diagnosis not present

## 2024-02-03 DIAGNOSIS — N186 End stage renal disease: Secondary | ICD-10-CM | POA: Diagnosis not present

## 2024-02-05 DIAGNOSIS — N2581 Secondary hyperparathyroidism of renal origin: Secondary | ICD-10-CM | POA: Diagnosis not present

## 2024-02-05 DIAGNOSIS — D689 Coagulation defect, unspecified: Secondary | ICD-10-CM | POA: Diagnosis not present

## 2024-02-05 DIAGNOSIS — N186 End stage renal disease: Secondary | ICD-10-CM | POA: Diagnosis not present

## 2024-02-05 DIAGNOSIS — Z992 Dependence on renal dialysis: Secondary | ICD-10-CM | POA: Diagnosis not present

## 2024-02-08 DIAGNOSIS — N2581 Secondary hyperparathyroidism of renal origin: Secondary | ICD-10-CM | POA: Diagnosis not present

## 2024-02-08 DIAGNOSIS — Z992 Dependence on renal dialysis: Secondary | ICD-10-CM | POA: Diagnosis not present

## 2024-02-08 DIAGNOSIS — D689 Coagulation defect, unspecified: Secondary | ICD-10-CM | POA: Diagnosis not present

## 2024-02-08 DIAGNOSIS — N186 End stage renal disease: Secondary | ICD-10-CM | POA: Diagnosis not present

## 2024-02-10 DIAGNOSIS — Z992 Dependence on renal dialysis: Secondary | ICD-10-CM | POA: Diagnosis not present

## 2024-02-10 DIAGNOSIS — N2581 Secondary hyperparathyroidism of renal origin: Secondary | ICD-10-CM | POA: Diagnosis not present

## 2024-02-10 DIAGNOSIS — D689 Coagulation defect, unspecified: Secondary | ICD-10-CM | POA: Diagnosis not present

## 2024-02-10 DIAGNOSIS — N186 End stage renal disease: Secondary | ICD-10-CM | POA: Diagnosis not present

## 2024-02-12 DIAGNOSIS — D689 Coagulation defect, unspecified: Secondary | ICD-10-CM | POA: Diagnosis not present

## 2024-02-12 DIAGNOSIS — N2581 Secondary hyperparathyroidism of renal origin: Secondary | ICD-10-CM | POA: Diagnosis not present

## 2024-02-12 DIAGNOSIS — N186 End stage renal disease: Secondary | ICD-10-CM | POA: Diagnosis not present

## 2024-02-12 DIAGNOSIS — Z992 Dependence on renal dialysis: Secondary | ICD-10-CM | POA: Diagnosis not present

## 2024-02-15 DIAGNOSIS — Z992 Dependence on renal dialysis: Secondary | ICD-10-CM | POA: Diagnosis not present

## 2024-02-15 DIAGNOSIS — D689 Coagulation defect, unspecified: Secondary | ICD-10-CM | POA: Diagnosis not present

## 2024-02-15 DIAGNOSIS — N186 End stage renal disease: Secondary | ICD-10-CM | POA: Diagnosis not present

## 2024-02-15 DIAGNOSIS — N2581 Secondary hyperparathyroidism of renal origin: Secondary | ICD-10-CM | POA: Diagnosis not present

## 2024-02-17 DIAGNOSIS — D689 Coagulation defect, unspecified: Secondary | ICD-10-CM | POA: Diagnosis not present

## 2024-02-17 DIAGNOSIS — N186 End stage renal disease: Secondary | ICD-10-CM | POA: Diagnosis not present

## 2024-02-17 DIAGNOSIS — Z992 Dependence on renal dialysis: Secondary | ICD-10-CM | POA: Diagnosis not present

## 2024-02-17 DIAGNOSIS — N2581 Secondary hyperparathyroidism of renal origin: Secondary | ICD-10-CM | POA: Diagnosis not present

## 2024-02-19 DIAGNOSIS — D689 Coagulation defect, unspecified: Secondary | ICD-10-CM | POA: Diagnosis not present

## 2024-02-19 DIAGNOSIS — N186 End stage renal disease: Secondary | ICD-10-CM | POA: Diagnosis not present

## 2024-02-19 DIAGNOSIS — N2581 Secondary hyperparathyroidism of renal origin: Secondary | ICD-10-CM | POA: Diagnosis not present

## 2024-02-19 DIAGNOSIS — Z992 Dependence on renal dialysis: Secondary | ICD-10-CM | POA: Diagnosis not present

## 2024-02-22 DIAGNOSIS — Z992 Dependence on renal dialysis: Secondary | ICD-10-CM | POA: Diagnosis not present

## 2024-02-22 DIAGNOSIS — D689 Coagulation defect, unspecified: Secondary | ICD-10-CM | POA: Diagnosis not present

## 2024-02-22 DIAGNOSIS — N2581 Secondary hyperparathyroidism of renal origin: Secondary | ICD-10-CM | POA: Diagnosis not present

## 2024-02-22 DIAGNOSIS — N186 End stage renal disease: Secondary | ICD-10-CM | POA: Diagnosis not present

## 2024-02-24 DIAGNOSIS — N2581 Secondary hyperparathyroidism of renal origin: Secondary | ICD-10-CM | POA: Diagnosis not present

## 2024-02-24 DIAGNOSIS — N186 End stage renal disease: Secondary | ICD-10-CM | POA: Diagnosis not present

## 2024-02-24 DIAGNOSIS — Z992 Dependence on renal dialysis: Secondary | ICD-10-CM | POA: Diagnosis not present

## 2024-02-24 DIAGNOSIS — D689 Coagulation defect, unspecified: Secondary | ICD-10-CM | POA: Diagnosis not present

## 2024-02-25 ENCOUNTER — Ambulatory Visit: Admitting: Podiatry

## 2024-02-26 DIAGNOSIS — D689 Coagulation defect, unspecified: Secondary | ICD-10-CM | POA: Diagnosis not present

## 2024-02-26 DIAGNOSIS — Z992 Dependence on renal dialysis: Secondary | ICD-10-CM | POA: Diagnosis not present

## 2024-02-26 DIAGNOSIS — N2581 Secondary hyperparathyroidism of renal origin: Secondary | ICD-10-CM | POA: Diagnosis not present

## 2024-02-26 DIAGNOSIS — N186 End stage renal disease: Secondary | ICD-10-CM | POA: Diagnosis not present

## 2024-02-29 DIAGNOSIS — N2581 Secondary hyperparathyroidism of renal origin: Secondary | ICD-10-CM | POA: Diagnosis not present

## 2024-02-29 DIAGNOSIS — D689 Coagulation defect, unspecified: Secondary | ICD-10-CM | POA: Diagnosis not present

## 2024-02-29 DIAGNOSIS — N186 End stage renal disease: Secondary | ICD-10-CM | POA: Diagnosis not present

## 2024-02-29 DIAGNOSIS — Z992 Dependence on renal dialysis: Secondary | ICD-10-CM | POA: Diagnosis not present

## 2024-03-01 DIAGNOSIS — I129 Hypertensive chronic kidney disease with stage 1 through stage 4 chronic kidney disease, or unspecified chronic kidney disease: Secondary | ICD-10-CM | POA: Diagnosis not present

## 2024-03-01 DIAGNOSIS — N186 End stage renal disease: Secondary | ICD-10-CM | POA: Diagnosis not present

## 2024-03-01 DIAGNOSIS — Z992 Dependence on renal dialysis: Secondary | ICD-10-CM | POA: Diagnosis not present

## 2024-03-03 DIAGNOSIS — E789 Disorder of lipoprotein metabolism, unspecified: Secondary | ICD-10-CM | POA: Diagnosis not present

## 2024-03-03 DIAGNOSIS — Z992 Dependence on renal dialysis: Secondary | ICD-10-CM | POA: Diagnosis not present

## 2024-03-03 DIAGNOSIS — I10 Essential (primary) hypertension: Secondary | ICD-10-CM | POA: Diagnosis not present

## 2024-03-03 DIAGNOSIS — N186 End stage renal disease: Secondary | ICD-10-CM | POA: Diagnosis not present

## 2024-03-03 DIAGNOSIS — E1122 Type 2 diabetes mellitus with diabetic chronic kidney disease: Secondary | ICD-10-CM | POA: Diagnosis not present

## 2024-03-03 DIAGNOSIS — I5032 Chronic diastolic (congestive) heart failure: Secondary | ICD-10-CM | POA: Diagnosis not present

## 2024-03-03 DIAGNOSIS — Z0000 Encounter for general adult medical examination without abnormal findings: Secondary | ICD-10-CM | POA: Diagnosis not present

## 2024-03-03 DIAGNOSIS — R251 Tremor, unspecified: Secondary | ICD-10-CM | POA: Diagnosis not present

## 2024-03-03 DIAGNOSIS — N2581 Secondary hyperparathyroidism of renal origin: Secondary | ICD-10-CM | POA: Diagnosis not present

## 2024-03-03 DIAGNOSIS — R29898 Other symptoms and signs involving the musculoskeletal system: Secondary | ICD-10-CM | POA: Diagnosis not present

## 2024-03-04 ENCOUNTER — Other Ambulatory Visit: Payer: Self-pay | Admitting: Internal Medicine

## 2024-03-04 DIAGNOSIS — Z1231 Encounter for screening mammogram for malignant neoplasm of breast: Secondary | ICD-10-CM

## 2024-04-14 ENCOUNTER — Ambulatory Visit: Admitting: Podiatry

## 2024-04-21 ENCOUNTER — Ambulatory Visit (INDEPENDENT_AMBULATORY_CARE_PROVIDER_SITE_OTHER)

## 2024-04-21 ENCOUNTER — Encounter (INDEPENDENT_AMBULATORY_CARE_PROVIDER_SITE_OTHER): Payer: Self-pay

## 2024-04-21 VITALS — BP 116/60 | HR 89 | Wt 305.0 lb

## 2024-04-21 DIAGNOSIS — H6123 Impacted cerumen, bilateral: Secondary | ICD-10-CM

## 2024-04-21 NOTE — Progress Notes (Signed)
 HPI:   [name] is a 68 y.o. female who presents as a new patient being seen in consultation for cerumen impaction. Patient states that she has only had her ears cleaned once before several years ago. She states she usually will notice when her ears are plugged with wax. She denies any ear pain, drainage, dizziness, or ear infections.    PMH/Meds/All/SocHx/FamHx/ROS: Past Medical History:  Diagnosis Date   Arthritis    Chronic diastolic CHF (congestive heart failure) (HCC)    a. Echo 5/17: severe LVH, EF 55-60%, mild AS, mean AV gradient 10 mmHg, mod LAE, PASP 37 mmHg   Diabetes mellitus without complication (HCC)    type 2   ESRD (end stage renal disease) (HCC)    Tues, Thurs, Sat dialysis   H/O blood clots    many years ago in leg   History of pneumonia    HLD (hyperlipidemia)    Hypertension    BP low after dialysis started, no further BP meds   Seizures (HCC)    had one or two (more than 10-15 years ago)   Sleep apnea    uses c-pap most of time   Past Surgical History:  Procedure Laterality Date   A/V FISTULAGRAM N/A 06/18/2023   Procedure: A/V Fistulagram;  Surgeon: Norine Manuelita LABOR, MD;  Location: [facility]AS[facility] CV LAB;  Service: Cardiovascular;  Laterality: N/A;   A/V SHUNT INTERVENTION N/A 09/17/2023   Procedure: A/V SHUNT INTERVENTION;  Surgeon: Melia Lynwood ORN, MD;  Location: [facility] INVASIVE CV LAB;  Service: Cardiovascular;  Laterality: N/A;   ABDOMINAL HYSTERECTOMY     AV FISTULA PLACEMENT Left 07/25/2015   Procedure: ARTERIOVENOUS (AV) FISTULA CREATION -LEFT BRACHIOCEPHALIC;  Surgeon: Redell LITTIE Door, MD;  Location: [facility] OR;  Service: Vascular;  Laterality: Left;   COLONOSCOPY     COLONOSCOPY WITH PROPOFOL  N/A 11/18/2017   Procedure: COLONOSCOPY WITH PROPOFOL ;  Surgeon: Burnette Fallow, MD;  Location: [facility];  Service: Endoscopy;  Laterality: N/A;   COLONOSCOPY WITH PROPOFOL  Bilateral 07/23/2022   Procedure: COLONOSCOPY WITH PROPOFOL ;  Surgeon: Burnette Fallow, MD;   Location: [facility];  Service: Gastroenterology;  Laterality: Bilateral;   FISTULA SUPERFICIALIZATION Left 11/01/2015   Procedure: SUPERFICIALIZATION OF LEFT ARM BRACHIOCEPHALIC FISTULA ;  Surgeon: Redell LITTIE Door, MD;  Location: [facility] OR;  Service: Vascular;  Laterality: Left;   HEMOSTASIS CLIP PLACEMENT  07/23/2022   Procedure: HEMOSTASIS CLIP PLACEMENT;  Surgeon: Burnette Fallow, MD;  Location: [facility];  Service: Gastroenterology;;   INSERTION OF DIALYSIS CATHETER Right 10/09/2015   Procedure: INSERTION OF DIALYSIS CATHETER RIGHT INTERNAL JUGULAR ;  Surgeon: Lonni GORMAN Blade, MD;  Location: [facility] OR;  Service: Vascular;  Laterality: Right;   PERIPHERAL VASCULAR BALLOON ANGIOPLASTY Left 06/18/2023   Procedure: PERIPHERAL VASCULAR BALLOON ANGIOPLASTY;  Surgeon: Norine Manuelita LABOR, MD;  Location: [facility];  Service: Cardiovascular;  Laterality: Left;  cephalic arch confluence   POLYPECTOMY  11/18/2017   Procedure: POLYPECTOMY;  Surgeon: Burnette Fallow, MD;  Location: [facility];  Service: Endoscopy;;   POLYPECTOMY  07/23/2022   Procedure: POLYPECTOMY;  Surgeon: Burnette Fallow, MD;  Location: [facility];  Service: Gastroenterology;;   No family history of bleeding disorders, wound healing problems or difficulty with anesthesia.  Social Connections: Not on file    Current Outpatient Medications:    acetaminophen  (TYLENOL ) 500 MG tablet, Take 500 mg by mouth every 6 (six) hours as needed for moderate pain., Disp: , Rfl:    aspirin  81 MG tablet,  Take 81 mg by mouth daily., Disp: , Rfl:    atorvastatin  (LIPITOR) 40 MG tablet, Take 40 mg by mouth daily., Disp: , Rfl:    cinacalcet  (SENSIPAR ) 60 MG tablet, Take 60 mg by mouth every Monday, Wednesday, and Friday. After dialysis, Disp: , Rfl:    diclofenac Sodium (VOLTAREN) 1 % GEL, Apply 1 Application topically 3 (three) times daily as needed (pain)., Disp: , Rfl:    febuxostat  (ULORIC ) 40 MG tablet, Take 40 mg by mouth daily., Disp: ,  Rfl:    HUMALOG KWIKPEN 100 UNIT/ML KwikPen, Inject 17 Units into the skin 3 (three) times daily., Disp: , Rfl:    lidocaine -prilocaine  (EMLA ) cream, Apply 1 application topically as needed (local anesthesia)., Disp: , Rfl:    multivitamin (RENA-VIT) TABS tablet, Take 1 tablet by mouth daily., Disp: 30 each, Rfl: 0   ONETOUCH VERIO test strip, TEST 2 TIMES A DAY AND AS NEEDED FOR BLOOD SUGAR, Disp: , Rfl: 0   senna-docusate (SENOKOT-S) 8.6-50 MG tablet, Take 2 tablets by mouth 2 (two) times daily., Disp: , Rfl:    TRADJENTA 5 MG TABS tablet, Take 5 mg by mouth daily., Disp: , Rfl:    TRESIBA FLEXTOUCH 100 UNIT/ML SOPN FlexTouch Pen, Inject 17 Units into the skin daily as needed (High BS)., Disp: , Rfl: 0   BD PEN NEEDLE NANO 2ND GEN 32G X 4 MM MISC, USE AS DIRECTED TO ADMINISTER INSULIN  FOUR TIMES DAILY; Duration: 25, Disp: , Rfl:    metoprolol  tartrate (LOPRESSOR ) 25 MG tablet, Take 12.5 mg by mouth every Monday, Wednesday, and Friday. After Dialysis (Patient not taking: Reported on 04/21/2024), Disp: , Rfl:    VELPHORO 500 MG chewable tablet, Chew 1,000 mg by mouth 3 (three) times daily with meals. (Patient not taking: Reported on 04/21/2024), Disp: , Rfl:  A complete ROS was performed with pertinent positives/negatives noted in the HPI. The remainder of the ROS are negative.   Physical Exam:  BP 116/60 (BP Location: Right Arm, Patient Position: Sitting, Cuff Size: Normal)   Pulse 89   Wt (!) 305 lb (138.3 kg)   SpO2 91%   BMI 49.23 kg/m  General: Well developed, well nourished. No acute distress.  Head/Face: Normocephalic. No sinus tenderness. Facial nerve intact and equal bilaterally. No facial lacerations. Eyes: PERRL, no scleral icterus or conjunctival hemorrhage. EOMI. Ears: No gross deformity. Cerumen impaction bilaterally. Normal external canal. Tympanic membrane in tact bilaterally Hearing: Normal speech reception.  Nose: No gross deformity or lesions. No purulent discharge.  Bilateral inferior turbinate hypertrophy. Mouth/Oropharynx: Lips without any lesions.  No mucosal lesions within the oropharynx. No tonsillar enlargement, exudate, or lesions. Pharyngeal walls symmetrical. Uvula midline. Tongue midline without lesions. Larynx: See TFL if applicable Nasopharynx: See TFL if applicable Neck: Trachea midline. No masses. No thyromegaly or nodules palpated. No crepitus. Lymphatic: No lymphadenopathy in the neck. Respiratory: No stridor or distress. Room air. Cardiovascular: Regular rate and rhythm. Extremities: No edema or cyanosis. Warm and well-perfused. Skin: No scars or lesions on face or neck. Neurologic: CN II-XII grossly intact. Moving all extremities without gross abnormality. Other:  Independent Review of Additional Tests or Records: None Procedures:  Procedure: Bilateral ear microscopy and cerumen removal using microscope (CPT [phone]) - Mod 50 Pre-procedure diagnosis: bilateral cerumen impaction external auditory canals Post-procedure diagnosis: same Indication: bilateral cerumen impaction; given patient's otologic complaints and history as well as for improved and comprehensive examination of external ear and tympanic membrane, bilateral otologic examination using microscope was  performed and impacted cerumen removed  Procedure: Patient was placed semi-recumbent. Both ear canals were examined using the microscope with findings above. Cerumen removed from bilateral external auditory canals using suction and currette with improvement in EAC examination and patency. Left: EAC was patent. TM was intact . Middle ear was aerated. Drainage: none Right: EAC was patent. TM was intact . Middle ear was aerated . Drainage: none Patient tolerated the procedure well.   Impression & Plans:  68 year old female with decreased hearing due to bilateral cerumen impaction. Patient does not want audiology evaluation at this time.   Cerumen impaction, bilateral -  Removed under microscope using instrumentation - Immediate improvement in hearing reported by patient - Patient would like to defer audiology evaluation at this time  Follow-up in 6 months for cerumen removal  Adah Malkin, DO [facility]

## 2024-05-10 ENCOUNTER — Ambulatory Visit: Admitting: Podiatry

## 2024-06-16 ENCOUNTER — Ambulatory Visit: Admitting: Podiatry

## 2024-07-05 ENCOUNTER — Ambulatory Visit: Admitting: Cardiology

## 2024-07-14 ENCOUNTER — Ambulatory Visit: Admitting: Podiatry

## 2024-08-18 ENCOUNTER — Ambulatory Visit: Admitting: Neurology

## 2024-09-06 ENCOUNTER — Ambulatory Visit: Admitting: Neurology

## 2024-09-13 ENCOUNTER — Ambulatory Visit: Admitting: Cardiology

## 2024-10-20 ENCOUNTER — Ambulatory Visit (INDEPENDENT_AMBULATORY_CARE_PROVIDER_SITE_OTHER)
